# Patient Record
Sex: Female | Born: 1935 | Race: Black or African American | Hispanic: No | Smoking: Former smoker
Health system: Southern US, Community
[De-identification: ages and names within clinical notes are randomized; demographics above are authoritative.]

## PROBLEM LIST (undated history)

## (undated) DIAGNOSIS — I10 Essential (primary) hypertension: Secondary | ICD-10-CM

## (undated) DIAGNOSIS — I739 Peripheral vascular disease, unspecified: Secondary | ICD-10-CM

## (undated) DIAGNOSIS — IMO0002 Reserved for concepts with insufficient information to code with codable children: Secondary | ICD-10-CM

## (undated) DIAGNOSIS — N179 Acute kidney failure, unspecified: Secondary | ICD-10-CM

## (undated) DIAGNOSIS — F32A Depression, unspecified: Secondary | ICD-10-CM

## (undated) DIAGNOSIS — E785 Hyperlipidemia, unspecified: Secondary | ICD-10-CM

## (undated) DIAGNOSIS — D649 Anemia, unspecified: Secondary | ICD-10-CM

## (undated) DIAGNOSIS — E0500 Thyrotoxicosis with diffuse goiter without thyrotoxic crisis or storm: Secondary | ICD-10-CM

## (undated) DIAGNOSIS — I5031 Acute diastolic (congestive) heart failure: Secondary | ICD-10-CM

## (undated) DIAGNOSIS — E119 Type 2 diabetes mellitus without complications: Secondary | ICD-10-CM

## (undated) DIAGNOSIS — E079 Disorder of thyroid, unspecified: Secondary | ICD-10-CM

## (undated) DIAGNOSIS — M1990 Unspecified osteoarthritis, unspecified site: Secondary | ICD-10-CM

## (undated) DIAGNOSIS — K559 Vascular disorder of intestine, unspecified: Secondary | ICD-10-CM

## (undated) DIAGNOSIS — E7800 Pure hypercholesterolemia, unspecified: Secondary | ICD-10-CM

## (undated) DIAGNOSIS — Z227 Latent tuberculosis: Secondary | ICD-10-CM

## (undated) DIAGNOSIS — D0510 Intraductal carcinoma in situ of unspecified breast: Secondary | ICD-10-CM

## (undated) DIAGNOSIS — F329 Major depressive disorder, single episode, unspecified: Secondary | ICD-10-CM

## (undated) DIAGNOSIS — Z9289 Personal history of other medical treatment: Secondary | ICD-10-CM

## (undated) HISTORY — DX: Major depressive disorder, single episode, unspecified: F32.9

## (undated) HISTORY — DX: Intraductal carcinoma in situ of unspecified breast: D05.10

## (undated) HISTORY — DX: Peripheral vascular disease, unspecified: I73.9

## (undated) HISTORY — PX: EYE SURGERY: SHX253

## (undated) HISTORY — DX: Reserved for concepts with insufficient information to code with codable children: IMO0002

## (undated) HISTORY — DX: Type 2 diabetes mellitus without complications: E11.9

## (undated) HISTORY — PX: ABDOMINAL HYSTERECTOMY: SHX81

## (undated) HISTORY — DX: Latent tuberculosis: Z22.7

## (undated) HISTORY — DX: Pure hypercholesterolemia, unspecified: E78.00

## (undated) HISTORY — DX: Essential (primary) hypertension: I10

## (undated) HISTORY — PX: BREAST SURGERY: SHX581

## (undated) HISTORY — DX: Disorder of thyroid, unspecified: E07.9

## (undated) HISTORY — DX: Depression, unspecified: F32.A

## (undated) HISTORY — DX: Vascular disorder of intestine, unspecified: K55.9

## (undated) HISTORY — DX: Hyperlipidemia, unspecified: E78.5

---

## 1973-09-30 HISTORY — PX: TOTAL ABDOMINAL HYSTERECTOMY W/ BILATERAL SALPINGOOPHORECTOMY: SHX83

## 1973-09-30 HISTORY — PX: ANKLE SURGERY: SHX546

## 1998-05-23 ENCOUNTER — Encounter: Admission: RE | Admit: 1998-05-23 | Discharge: 1998-05-23 | Payer: Self-pay | Admitting: Internal Medicine

## 2000-05-02 ENCOUNTER — Ambulatory Visit (HOSPITAL_COMMUNITY): Admission: RE | Admit: 2000-05-02 | Discharge: 2000-05-02 | Payer: Self-pay | Admitting: Family Medicine

## 2000-09-26 ENCOUNTER — Encounter: Payer: Self-pay | Admitting: *Deleted

## 2000-09-26 ENCOUNTER — Ambulatory Visit (HOSPITAL_COMMUNITY): Admission: RE | Admit: 2000-09-26 | Discharge: 2000-09-26 | Payer: Self-pay | Admitting: *Deleted

## 2001-03-06 ENCOUNTER — Encounter: Admission: RE | Admit: 2001-03-06 | Discharge: 2001-03-06 | Payer: Self-pay | Admitting: Internal Medicine

## 2001-03-12 ENCOUNTER — Encounter: Payer: Self-pay | Admitting: *Deleted

## 2001-03-12 ENCOUNTER — Ambulatory Visit (HOSPITAL_COMMUNITY): Admission: RE | Admit: 2001-03-12 | Discharge: 2001-03-12 | Payer: Self-pay | Admitting: *Deleted

## 2001-03-20 ENCOUNTER — Encounter: Admission: RE | Admit: 2001-03-20 | Discharge: 2001-03-20 | Payer: Self-pay | Admitting: Internal Medicine

## 2001-03-30 HISTORY — PX: FEMORAL-POPLITEAL BYPASS GRAFT: SHX937

## 2001-04-06 ENCOUNTER — Encounter: Admission: RE | Admit: 2001-04-06 | Discharge: 2001-04-06 | Payer: Self-pay | Admitting: Internal Medicine

## 2001-04-14 ENCOUNTER — Encounter: Payer: Self-pay | Admitting: Vascular Surgery

## 2001-04-16 ENCOUNTER — Ambulatory Visit (HOSPITAL_COMMUNITY): Admission: RE | Admit: 2001-04-16 | Discharge: 2001-04-16 | Payer: Self-pay | Admitting: Vascular Surgery

## 2001-04-20 ENCOUNTER — Encounter: Payer: Self-pay | Admitting: Vascular Surgery

## 2001-04-20 ENCOUNTER — Inpatient Hospital Stay (HOSPITAL_COMMUNITY): Admission: RE | Admit: 2001-04-20 | Discharge: 2001-04-23 | Payer: Self-pay | Admitting: Vascular Surgery

## 2001-06-09 ENCOUNTER — Emergency Department (HOSPITAL_COMMUNITY): Admission: EM | Admit: 2001-06-09 | Discharge: 2001-06-09 | Payer: Self-pay | Admitting: Emergency Medicine

## 2001-11-17 ENCOUNTER — Encounter: Admission: RE | Admit: 2001-11-17 | Discharge: 2001-11-17 | Payer: Self-pay | Admitting: *Deleted

## 2003-01-29 HISTORY — PX: FEMORAL-POPLITEAL BYPASS GRAFT: SHX937

## 2003-02-08 ENCOUNTER — Encounter: Payer: Self-pay | Admitting: Vascular Surgery

## 2003-02-09 ENCOUNTER — Ambulatory Visit (HOSPITAL_COMMUNITY): Admission: RE | Admit: 2003-02-09 | Discharge: 2003-02-09 | Payer: Self-pay | Admitting: Vascular Surgery

## 2003-02-14 ENCOUNTER — Encounter: Payer: Self-pay | Admitting: Vascular Surgery

## 2003-02-14 ENCOUNTER — Inpatient Hospital Stay (HOSPITAL_COMMUNITY): Admission: RE | Admit: 2003-02-14 | Discharge: 2003-02-19 | Payer: Self-pay | Admitting: Vascular Surgery

## 2003-02-18 ENCOUNTER — Encounter: Payer: Self-pay | Admitting: Vascular Surgery

## 2003-06-01 DIAGNOSIS — D0510 Intraductal carcinoma in situ of unspecified breast: Secondary | ICD-10-CM

## 2003-06-01 HISTORY — DX: Intraductal carcinoma in situ of unspecified breast: D05.10

## 2003-06-09 ENCOUNTER — Encounter: Admission: RE | Admit: 2003-06-09 | Discharge: 2003-06-09 | Payer: Self-pay | Admitting: Internal Medicine

## 2003-06-20 ENCOUNTER — Ambulatory Visit (HOSPITAL_COMMUNITY): Admission: RE | Admit: 2003-06-20 | Discharge: 2003-06-20 | Payer: Self-pay

## 2003-06-21 ENCOUNTER — Encounter: Admission: RE | Admit: 2003-06-21 | Discharge: 2003-06-21 | Payer: Self-pay | Admitting: Internal Medicine

## 2003-06-28 ENCOUNTER — Encounter: Admission: RE | Admit: 2003-06-28 | Discharge: 2003-06-28 | Payer: Self-pay | Admitting: Internal Medicine

## 2003-06-28 ENCOUNTER — Encounter (INDEPENDENT_AMBULATORY_CARE_PROVIDER_SITE_OTHER): Payer: Self-pay | Admitting: *Deleted

## 2003-06-28 ENCOUNTER — Encounter: Payer: Self-pay | Admitting: Internal Medicine

## 2003-08-03 ENCOUNTER — Emergency Department (HOSPITAL_COMMUNITY): Admission: EM | Admit: 2003-08-03 | Discharge: 2003-08-03 | Payer: Self-pay | Admitting: *Deleted

## 2003-09-02 ENCOUNTER — Encounter: Admission: RE | Admit: 2003-09-02 | Discharge: 2003-09-02 | Payer: Self-pay | Admitting: Internal Medicine

## 2003-09-07 ENCOUNTER — Ambulatory Visit (HOSPITAL_COMMUNITY): Admission: RE | Admit: 2003-09-07 | Discharge: 2003-09-07 | Payer: Self-pay | Admitting: Internal Medicine

## 2003-10-01 ENCOUNTER — Ambulatory Visit (HOSPITAL_COMMUNITY): Admission: RE | Admit: 2003-10-01 | Discharge: 2003-10-01 | Payer: Self-pay | Admitting: Family Medicine

## 2003-10-11 ENCOUNTER — Encounter: Admission: RE | Admit: 2003-10-11 | Discharge: 2003-10-11 | Payer: Self-pay | Admitting: Internal Medicine

## 2004-01-20 ENCOUNTER — Encounter: Admission: RE | Admit: 2004-01-20 | Discharge: 2004-01-20 | Payer: Self-pay | Admitting: General Surgery

## 2004-01-29 HISTORY — PX: MASTECTOMY, PARTIAL: SHX709

## 2004-02-01 ENCOUNTER — Encounter: Admission: RE | Admit: 2004-02-01 | Discharge: 2004-02-01 | Payer: Self-pay | Admitting: General Surgery

## 2004-02-03 ENCOUNTER — Ambulatory Visit (HOSPITAL_COMMUNITY): Admission: RE | Admit: 2004-02-03 | Discharge: 2004-02-03 | Payer: Self-pay | Admitting: General Surgery

## 2004-02-03 ENCOUNTER — Ambulatory Visit (HOSPITAL_BASED_OUTPATIENT_CLINIC_OR_DEPARTMENT_OTHER): Admission: RE | Admit: 2004-02-03 | Discharge: 2004-02-03 | Payer: Self-pay | Admitting: General Surgery

## 2004-02-03 ENCOUNTER — Encounter: Admission: RE | Admit: 2004-02-03 | Discharge: 2004-02-03 | Payer: Self-pay | Admitting: General Surgery

## 2004-02-03 ENCOUNTER — Encounter (INDEPENDENT_AMBULATORY_CARE_PROVIDER_SITE_OTHER): Payer: Self-pay | Admitting: Specialist

## 2004-02-10 ENCOUNTER — Encounter: Admission: RE | Admit: 2004-02-10 | Discharge: 2004-02-10 | Payer: Self-pay | Admitting: Internal Medicine

## 2004-02-24 ENCOUNTER — Encounter: Admission: RE | Admit: 2004-02-24 | Discharge: 2004-02-24 | Payer: Self-pay | Admitting: Internal Medicine

## 2004-03-07 ENCOUNTER — Ambulatory Visit: Admission: RE | Admit: 2004-03-07 | Discharge: 2004-04-10 | Payer: Self-pay | Admitting: Radiation Oncology

## 2004-06-28 ENCOUNTER — Encounter: Admission: RE | Admit: 2004-06-28 | Discharge: 2004-06-28 | Payer: Self-pay | Admitting: General Surgery

## 2004-10-22 ENCOUNTER — Ambulatory Visit: Payer: Self-pay | Admitting: Internal Medicine

## 2004-10-22 LAB — HM PAP SMEAR

## 2004-10-22 LAB — CONVERTED CEMR LAB

## 2004-11-05 ENCOUNTER — Ambulatory Visit: Payer: Self-pay | Admitting: Internal Medicine

## 2004-11-05 ENCOUNTER — Ambulatory Visit (HOSPITAL_COMMUNITY): Admission: RE | Admit: 2004-11-05 | Discharge: 2004-11-05 | Payer: Self-pay | Admitting: Internal Medicine

## 2004-11-19 ENCOUNTER — Ambulatory Visit: Payer: Self-pay | Admitting: Internal Medicine

## 2004-11-26 ENCOUNTER — Encounter: Admission: RE | Admit: 2004-11-26 | Discharge: 2005-02-24 | Payer: Self-pay | Admitting: Internal Medicine

## 2005-03-06 ENCOUNTER — Ambulatory Visit: Payer: Self-pay | Admitting: Internal Medicine

## 2005-05-20 ENCOUNTER — Ambulatory Visit: Payer: Self-pay | Admitting: Oncology

## 2005-07-01 ENCOUNTER — Encounter: Admission: RE | Admit: 2005-07-01 | Discharge: 2005-07-01 | Payer: Self-pay | Admitting: Oncology

## 2005-10-03 ENCOUNTER — Ambulatory Visit: Payer: Self-pay | Admitting: Internal Medicine

## 2006-01-28 ENCOUNTER — Ambulatory Visit: Payer: Self-pay | Admitting: Internal Medicine

## 2006-01-29 ENCOUNTER — Encounter (INDEPENDENT_AMBULATORY_CARE_PROVIDER_SITE_OTHER): Payer: Self-pay | Admitting: *Deleted

## 2006-01-29 LAB — CONVERTED CEMR LAB: Microalbumin U total vol: 5.2 mg/L

## 2006-02-11 ENCOUNTER — Ambulatory Visit: Payer: Self-pay | Admitting: Internal Medicine

## 2006-04-01 ENCOUNTER — Ambulatory Visit: Payer: Self-pay | Admitting: Internal Medicine

## 2006-05-19 ENCOUNTER — Ambulatory Visit: Payer: Self-pay | Admitting: Oncology

## 2006-07-07 ENCOUNTER — Encounter: Admission: RE | Admit: 2006-07-07 | Discharge: 2006-07-07 | Payer: Self-pay | Admitting: General Surgery

## 2006-08-04 ENCOUNTER — Encounter (INDEPENDENT_AMBULATORY_CARE_PROVIDER_SITE_OTHER): Payer: Self-pay | Admitting: *Deleted

## 2006-08-04 DIAGNOSIS — I10 Essential (primary) hypertension: Secondary | ICD-10-CM | POA: Insufficient documentation

## 2006-08-04 DIAGNOSIS — D179 Benign lipomatous neoplasm, unspecified: Secondary | ICD-10-CM | POA: Insufficient documentation

## 2006-08-04 DIAGNOSIS — Z9079 Acquired absence of other genital organ(s): Secondary | ICD-10-CM | POA: Insufficient documentation

## 2006-08-04 DIAGNOSIS — E119 Type 2 diabetes mellitus without complications: Secondary | ICD-10-CM | POA: Insufficient documentation

## 2006-08-04 DIAGNOSIS — Z853 Personal history of malignant neoplasm of breast: Secondary | ICD-10-CM | POA: Insufficient documentation

## 2006-09-09 ENCOUNTER — Encounter (INDEPENDENT_AMBULATORY_CARE_PROVIDER_SITE_OTHER): Payer: Self-pay | Admitting: *Deleted

## 2006-09-09 ENCOUNTER — Ambulatory Visit: Payer: Self-pay | Admitting: Internal Medicine

## 2006-09-09 LAB — CONVERTED CEMR LAB: Hgb A1c MFr Bld: 7.7 %

## 2006-10-22 ENCOUNTER — Telehealth (INDEPENDENT_AMBULATORY_CARE_PROVIDER_SITE_OTHER): Payer: Self-pay | Admitting: *Deleted

## 2007-03-03 ENCOUNTER — Telehealth (INDEPENDENT_AMBULATORY_CARE_PROVIDER_SITE_OTHER): Payer: Self-pay | Admitting: *Deleted

## 2007-03-26 ENCOUNTER — Telehealth: Payer: Self-pay | Admitting: *Deleted

## 2007-04-07 ENCOUNTER — Ambulatory Visit: Payer: Self-pay | Admitting: Vascular Surgery

## 2007-04-08 ENCOUNTER — Encounter (INDEPENDENT_AMBULATORY_CARE_PROVIDER_SITE_OTHER): Payer: Self-pay | Admitting: *Deleted

## 2007-04-08 ENCOUNTER — Ambulatory Visit: Payer: Self-pay | Admitting: Internal Medicine

## 2007-04-08 LAB — CONVERTED CEMR LAB
BUN: 15 mg/dL (ref 6–23)
Blood Glucose, Fingerstick: 197
CO2: 25 meq/L (ref 19–32)
Calcium: 9.3 mg/dL (ref 8.4–10.5)
Chloride: 103 meq/L (ref 96–112)
Creatinine, Ser: 0.87 mg/dL (ref 0.40–1.20)
Glucose, Bld: 150 mg/dL — ABNORMAL HIGH (ref 70–99)
Hgb A1c MFr Bld: 6.8 %
Potassium: 4.2 meq/L (ref 3.5–5.3)
Sodium: 140 meq/L (ref 135–145)

## 2007-07-09 ENCOUNTER — Encounter: Admission: RE | Admit: 2007-07-09 | Discharge: 2007-07-09 | Payer: Self-pay | Admitting: General Surgery

## 2007-09-15 ENCOUNTER — Telehealth: Payer: Self-pay | Admitting: *Deleted

## 2007-10-20 ENCOUNTER — Telehealth: Payer: Self-pay | Admitting: Infectious Disease

## 2007-10-26 ENCOUNTER — Encounter (INDEPENDENT_AMBULATORY_CARE_PROVIDER_SITE_OTHER): Payer: Self-pay | Admitting: Internal Medicine

## 2007-10-28 ENCOUNTER — Ambulatory Visit: Payer: Self-pay | Admitting: Internal Medicine

## 2007-10-28 ENCOUNTER — Encounter (INDEPENDENT_AMBULATORY_CARE_PROVIDER_SITE_OTHER): Payer: Self-pay | Admitting: Internal Medicine

## 2007-10-28 LAB — CONVERTED CEMR LAB
Blood Glucose, Fingerstick: 147
Hgb A1c MFr Bld: 7 %

## 2007-10-29 ENCOUNTER — Ambulatory Visit (HOSPITAL_COMMUNITY): Admission: RE | Admit: 2007-10-29 | Discharge: 2007-10-29 | Payer: Self-pay | Admitting: Internal Medicine

## 2007-10-29 LAB — CONVERTED CEMR LAB
ALT: 20 units/L (ref 0–35)
AST: 17 units/L (ref 0–37)
Albumin: 4.3 g/dL (ref 3.5–5.2)
Alkaline Phosphatase: 84 units/L (ref 39–117)
BUN: 15 mg/dL (ref 6–23)
CO2: 22 meq/L (ref 19–32)
Calcium: 9.3 mg/dL (ref 8.4–10.5)
Chloride: 109 meq/L (ref 96–112)
Cholesterol: 271 mg/dL — ABNORMAL HIGH (ref 0–200)
Creatinine, Ser: 0.7 mg/dL (ref 0.40–1.20)
Glucose, Bld: 148 mg/dL — ABNORMAL HIGH (ref 70–99)
HDL: 65 mg/dL (ref >39)
LDL Cholesterol: 186 mg/dL — ABNORMAL HIGH (ref 0–99)
Potassium: 4.3 meq/L (ref 3.5–5.3)
Sodium: 144 meq/L (ref 135–145)
Total Bilirubin: 0.3 mg/dL (ref 0.3–1.2)
Total CHOL/HDL Ratio: 4.2
Total Protein: 7.6 g/dL (ref 6.0–8.3)
Triglycerides: 101 mg/dL (ref <150)
VLDL: 20 mg/dL (ref 0–40)

## 2007-11-18 ENCOUNTER — Encounter (INDEPENDENT_AMBULATORY_CARE_PROVIDER_SITE_OTHER): Payer: Self-pay | Admitting: Internal Medicine

## 2007-11-23 ENCOUNTER — Ambulatory Visit: Payer: Self-pay | Admitting: Internal Medicine

## 2007-11-23 LAB — CONVERTED CEMR LAB: Blood Glucose, Fingerstick: 230

## 2007-12-23 ENCOUNTER — Telehealth: Payer: Self-pay | Admitting: *Deleted

## 2007-12-24 ENCOUNTER — Encounter (INDEPENDENT_AMBULATORY_CARE_PROVIDER_SITE_OTHER): Payer: Self-pay | Admitting: *Deleted

## 2007-12-29 ENCOUNTER — Ambulatory Visit: Payer: Self-pay | Admitting: Internal Medicine

## 2007-12-29 DIAGNOSIS — J984 Other disorders of lung: Secondary | ICD-10-CM | POA: Insufficient documentation

## 2007-12-29 LAB — CONVERTED CEMR LAB: Blood Glucose, Fingerstick: 204

## 2008-01-04 ENCOUNTER — Ambulatory Visit (HOSPITAL_COMMUNITY): Admission: RE | Admit: 2008-01-04 | Discharge: 2008-01-04 | Payer: Self-pay | Admitting: Internal Medicine

## 2008-02-08 ENCOUNTER — Ambulatory Visit: Payer: Self-pay | Admitting: Vascular Surgery

## 2008-03-21 ENCOUNTER — Telehealth (INDEPENDENT_AMBULATORY_CARE_PROVIDER_SITE_OTHER): Payer: Self-pay | Admitting: Internal Medicine

## 2008-03-22 ENCOUNTER — Encounter (INDEPENDENT_AMBULATORY_CARE_PROVIDER_SITE_OTHER): Payer: Self-pay | Admitting: Internal Medicine

## 2008-03-23 ENCOUNTER — Encounter (INDEPENDENT_AMBULATORY_CARE_PROVIDER_SITE_OTHER): Payer: Self-pay | Admitting: Internal Medicine

## 2008-03-28 ENCOUNTER — Ambulatory Visit: Payer: Self-pay | Admitting: Internal Medicine

## 2008-03-28 ENCOUNTER — Encounter (INDEPENDENT_AMBULATORY_CARE_PROVIDER_SITE_OTHER): Payer: Self-pay | Admitting: *Deleted

## 2008-03-28 LAB — CONVERTED CEMR LAB
BUN: 18 mg/dL (ref 6–23)
Blood Glucose, Fingerstick: 113
CO2: 23 meq/L (ref 19–32)
Calcium: 9.4 mg/dL (ref 8.4–10.5)
Chloride: 103 meq/L (ref 96–112)
Creatinine, Ser: 0.61 mg/dL (ref 0.40–1.20)
Glucose, Bld: 103 mg/dL — ABNORMAL HIGH (ref 70–99)
Hgb A1c MFr Bld: 7.5 %
Potassium: 4.1 meq/L (ref 3.5–5.3)
Sodium: 139 meq/L (ref 135–145)

## 2008-04-19 ENCOUNTER — Encounter (INDEPENDENT_AMBULATORY_CARE_PROVIDER_SITE_OTHER): Payer: Self-pay | Admitting: Internal Medicine

## 2008-04-20 ENCOUNTER — Encounter (INDEPENDENT_AMBULATORY_CARE_PROVIDER_SITE_OTHER): Payer: Self-pay | Admitting: Internal Medicine

## 2008-04-27 ENCOUNTER — Ambulatory Visit: Payer: Self-pay | Admitting: Internal Medicine

## 2008-04-27 ENCOUNTER — Encounter (INDEPENDENT_AMBULATORY_CARE_PROVIDER_SITE_OTHER): Payer: Self-pay | Admitting: Internal Medicine

## 2008-04-27 LAB — CONVERTED CEMR LAB
ALT: 22 units/L (ref 0–35)
AST: 23 units/L (ref 0–37)
Albumin: 3.8 g/dL (ref 3.5–5.2)
Alkaline Phosphatase: 64 units/L (ref 39–117)
BUN: 14 mg/dL (ref 6–23)
CO2: 24 meq/L (ref 19–32)
Calcium: 8.9 mg/dL (ref 8.4–10.5)
Chloride: 108 meq/L (ref 96–112)
Creatinine, Ser: 0.7 mg/dL (ref 0.40–1.20)
Glucose, Bld: 94 mg/dL (ref 70–99)
HCT: 36.2 % (ref 36.0–46.0)
Hemoglobin: 12.4 g/dL (ref 12.0–15.0)
MCHC: 34.3 g/dL (ref 30.0–36.0)
MCV: 97.8 fL (ref 78.0–100.0)
Platelets: 228 10*3/uL (ref 150–400)
Potassium: 3.1 meq/L — ABNORMAL LOW (ref 3.5–5.3)
RBC: 3.7 M/uL — ABNORMAL LOW (ref 3.87–5.11)
RDW: 13.5 % (ref 11.5–15.5)
Sodium: 139 meq/L (ref 135–145)
Total Bilirubin: 0.7 mg/dL (ref 0.3–1.2)
Total Protein: 6.8 g/dL (ref 6.0–8.3)
WBC: 5.2 10*3/uL (ref 4.0–10.5)

## 2008-04-30 LAB — CONVERTED CEMR LAB
Bilirubin Urine: NEGATIVE
Creatinine, Urine: 201.7 mg/dL
Hemoglobin, Urine: NEGATIVE
Ketones, ur: NEGATIVE mg/dL
Leukocytes, UA: NEGATIVE
Microalb Creat Ratio: 367.9 mg/g — ABNORMAL HIGH (ref 0.0–30.0)
Microalb, Ur: 74.2 mg/dL — ABNORMAL HIGH (ref 0.00–1.89)
Nitrite: NEGATIVE
Protein, ur: 100 mg/dL — AB
RBC / HPF: NONE SEEN (ref <3)
Specific Gravity, Urine: 1.024 (ref 1.005–1.03)
Urine Glucose: NEGATIVE mg/dL
Urobilinogen, UA: 0.2 (ref 0.0–1.0)
pH: 5.5 (ref 5.0–8.0)

## 2008-05-04 ENCOUNTER — Ambulatory Visit (HOSPITAL_COMMUNITY): Admission: RE | Admit: 2008-05-04 | Discharge: 2008-05-04 | Payer: Self-pay | Admitting: Internal Medicine

## 2008-05-04 ENCOUNTER — Encounter (INDEPENDENT_AMBULATORY_CARE_PROVIDER_SITE_OTHER): Payer: Self-pay | Admitting: Emergency Medicine

## 2008-05-04 ENCOUNTER — Ambulatory Visit: Payer: Self-pay | Admitting: Cardiology

## 2008-05-23 ENCOUNTER — Ambulatory Visit: Payer: Self-pay | Admitting: Internal Medicine

## 2008-05-23 DIAGNOSIS — E785 Hyperlipidemia, unspecified: Secondary | ICD-10-CM | POA: Insufficient documentation

## 2008-07-11 ENCOUNTER — Encounter: Admission: RE | Admit: 2008-07-11 | Discharge: 2008-07-11 | Payer: Self-pay | Admitting: Internal Medicine

## 2008-09-08 ENCOUNTER — Ambulatory Visit: Payer: Self-pay | Admitting: Internal Medicine

## 2008-09-08 ENCOUNTER — Encounter (INDEPENDENT_AMBULATORY_CARE_PROVIDER_SITE_OTHER): Payer: Self-pay | Admitting: Internal Medicine

## 2008-09-08 LAB — CONVERTED CEMR LAB
Blood Glucose, Fingerstick: 88
Hgb A1c MFr Bld: 6.1 %

## 2008-09-13 ENCOUNTER — Encounter (INDEPENDENT_AMBULATORY_CARE_PROVIDER_SITE_OTHER): Payer: Self-pay | Admitting: Internal Medicine

## 2008-09-21 ENCOUNTER — Ambulatory Visit: Payer: Self-pay | Admitting: Infectious Diseases

## 2008-09-21 LAB — CONVERTED CEMR LAB: Blood Glucose, Fingerstick: 125

## 2008-10-24 ENCOUNTER — Encounter (INDEPENDENT_AMBULATORY_CARE_PROVIDER_SITE_OTHER): Payer: Self-pay | Admitting: Internal Medicine

## 2008-10-24 ENCOUNTER — Ambulatory Visit: Payer: Self-pay | Admitting: Internal Medicine

## 2008-10-24 LAB — CONVERTED CEMR LAB
ALT: 26 units/L (ref 0–35)
AST: 19 units/L (ref 0–37)
Albumin: 4.1 g/dL (ref 3.5–5.2)
Alkaline Phosphatase: 62 units/L (ref 39–117)
BUN: 23 mg/dL (ref 6–23)
CO2: 22 meq/L (ref 19–32)
Calcium: 9.1 mg/dL (ref 8.4–10.5)
Chloride: 105 meq/L (ref 96–112)
Cholesterol: 145 mg/dL (ref 0–200)
Creatinine, Ser: 0.89 mg/dL (ref 0.40–1.20)
Glucose, Bld: 122 mg/dL — ABNORMAL HIGH (ref 70–99)
HDL: 63 mg/dL (ref >39)
LDL Cholesterol: 66 mg/dL (ref 0–99)
Potassium: 4.1 meq/L (ref 3.5–5.3)
Sodium: 142 meq/L (ref 135–145)
Total Bilirubin: 0.6 mg/dL (ref 0.3–1.2)
Total CHOL/HDL Ratio: 2.3
Total Protein: 7 g/dL (ref 6.0–8.3)
Triglycerides: 82 mg/dL (ref <150)
VLDL: 16 mg/dL (ref 0–40)

## 2008-11-03 ENCOUNTER — Ambulatory Visit: Payer: Self-pay | Admitting: Internal Medicine

## 2008-11-03 ENCOUNTER — Ambulatory Visit (HOSPITAL_COMMUNITY): Admission: RE | Admit: 2008-11-03 | Discharge: 2008-11-03 | Payer: Self-pay | Admitting: *Deleted

## 2008-11-14 ENCOUNTER — Telehealth: Payer: Self-pay | Admitting: *Deleted

## 2008-11-30 ENCOUNTER — Encounter (INDEPENDENT_AMBULATORY_CARE_PROVIDER_SITE_OTHER): Payer: Self-pay | Admitting: Internal Medicine

## 2008-12-06 ENCOUNTER — Ambulatory Visit: Payer: Self-pay | Admitting: Internal Medicine

## 2009-01-10 ENCOUNTER — Ambulatory Visit: Payer: Self-pay | Admitting: Internal Medicine

## 2009-01-10 ENCOUNTER — Encounter: Payer: Self-pay | Admitting: Internal Medicine

## 2009-01-10 LAB — HM COLONOSCOPY: HM Colonoscopy: 2

## 2009-01-14 ENCOUNTER — Encounter: Payer: Self-pay | Admitting: Internal Medicine

## 2009-02-07 ENCOUNTER — Ambulatory Visit: Payer: Self-pay | Admitting: Vascular Surgery

## 2009-02-20 ENCOUNTER — Encounter (INDEPENDENT_AMBULATORY_CARE_PROVIDER_SITE_OTHER): Payer: Self-pay | Admitting: Internal Medicine

## 2009-02-20 ENCOUNTER — Ambulatory Visit: Payer: Self-pay | Admitting: *Deleted

## 2009-02-20 ENCOUNTER — Ambulatory Visit (HOSPITAL_COMMUNITY): Admission: RE | Admit: 2009-02-20 | Discharge: 2009-02-20 | Payer: Self-pay | Admitting: *Deleted

## 2009-02-20 LAB — CONVERTED CEMR LAB
Bilirubin Urine: NEGATIVE
Blood Glucose, Fingerstick: 89
Creatinine, Urine: 92 mg/dL
Hemoglobin, Urine: NEGATIVE
Hgb A1c MFr Bld: 6.7 %
Ketones, ur: NEGATIVE mg/dL
Leukocytes, UA: NEGATIVE
Microalb Creat Ratio: 93.9 mg/g — ABNORMAL HIGH (ref 0.0–30.0)
Microalb, Ur: 8.64 mg/dL — ABNORMAL HIGH (ref 0.00–1.89)
Nitrite: NEGATIVE
Protein, ur: NEGATIVE mg/dL
Specific Gravity, Urine: 1.016 (ref 1.005–1.030)
Urine Glucose: NEGATIVE mg/dL
Urobilinogen, UA: 2 — ABNORMAL HIGH (ref 0.0–1.0)
pH: 6.5 (ref 5.0–8.0)

## 2009-02-24 ENCOUNTER — Emergency Department (HOSPITAL_COMMUNITY): Admission: EM | Admit: 2009-02-24 | Discharge: 2009-02-24 | Payer: Self-pay | Admitting: Emergency Medicine

## 2009-07-12 ENCOUNTER — Encounter: Admission: RE | Admit: 2009-07-12 | Discharge: 2009-07-12 | Payer: Self-pay | Admitting: Internal Medicine

## 2010-01-16 ENCOUNTER — Ambulatory Visit: Payer: Self-pay | Admitting: Internal Medicine

## 2010-01-16 LAB — CONVERTED CEMR LAB
Blood Glucose, Fingerstick: 105
Hgb A1c MFr Bld: 6.3 %

## 2010-01-18 ENCOUNTER — Telehealth: Payer: Self-pay | Admitting: *Deleted

## 2010-01-18 LAB — CONVERTED CEMR LAB
ALT: 17 units/L (ref 0–35)
AST: 20 units/L (ref 0–37)
Albumin: 4.3 g/dL (ref 3.5–5.2)
Alkaline Phosphatase: 73 units/L (ref 39–117)
BUN: 22 mg/dL (ref 6–23)
CO2: 25 meq/L (ref 19–32)
Calcium: 9.2 mg/dL (ref 8.4–10.5)
Chloride: 101 meq/L (ref 96–112)
Cholesterol: 237 mg/dL — ABNORMAL HIGH (ref 0–200)
Creatinine, Ser: 0.78 mg/dL (ref 0.40–1.20)
Glucose, Bld: 131 mg/dL — ABNORMAL HIGH (ref 70–99)
HDL: 71 mg/dL (ref >39)
LDL Cholesterol: 145 mg/dL — ABNORMAL HIGH (ref 0–99)
Potassium: 4.1 meq/L (ref 3.5–5.3)
Sodium: 138 meq/L (ref 135–145)
Total Bilirubin: 0.5 mg/dL (ref 0.3–1.2)
Total CHOL/HDL Ratio: 3.3
Total Protein: 7.5 g/dL (ref 6.0–8.3)
Triglycerides: 103 mg/dL (ref <150)
VLDL: 21 mg/dL (ref 0–40)

## 2010-02-22 ENCOUNTER — Ambulatory Visit: Payer: Self-pay | Admitting: Vascular Surgery

## 2010-03-05 ENCOUNTER — Telehealth (INDEPENDENT_AMBULATORY_CARE_PROVIDER_SITE_OTHER): Payer: Self-pay | Admitting: Internal Medicine

## 2010-06-18 ENCOUNTER — Ambulatory Visit: Payer: Self-pay | Admitting: Internal Medicine

## 2010-06-18 LAB — CONVERTED CEMR LAB
Blood Glucose, Fingerstick: 105
Cholesterol, target level: 200 mg/dL
HDL goal, serum: 40 mg/dL
Hgb A1c MFr Bld: 5.9 %
LDL Goal: 70 mg/dL

## 2010-06-18 LAB — HM DIABETES FOOT EXAM: HM Diabetic Foot Exam: HIGH

## 2010-06-19 ENCOUNTER — Encounter: Payer: Self-pay | Admitting: Internal Medicine

## 2010-06-19 LAB — CONVERTED CEMR LAB
Creatinine, Urine: 136.4 mg/dL
Microalb Creat Ratio: 95.7 mg/g — ABNORMAL HIGH (ref 0.0–30.0)
Microalb, Ur: 13.06 mg/dL — ABNORMAL HIGH (ref 0.00–1.89)

## 2010-06-20 ENCOUNTER — Encounter: Payer: Self-pay | Admitting: Internal Medicine

## 2010-06-25 ENCOUNTER — Telehealth (INDEPENDENT_AMBULATORY_CARE_PROVIDER_SITE_OTHER): Payer: Self-pay | Admitting: *Deleted

## 2010-07-09 ENCOUNTER — Encounter: Admission: RE | Admit: 2010-07-09 | Discharge: 2010-07-09 | Payer: Self-pay | Admitting: Internal Medicine

## 2010-07-09 LAB — HM MAMMOGRAPHY

## 2010-08-16 ENCOUNTER — Ambulatory Visit: Payer: Self-pay | Admitting: Internal Medicine

## 2010-08-16 DIAGNOSIS — K5900 Constipation, unspecified: Secondary | ICD-10-CM | POA: Insufficient documentation

## 2010-08-16 LAB — CONVERTED CEMR LAB: Blood Glucose, Fingerstick: 120

## 2010-08-29 ENCOUNTER — Ambulatory Visit (HOSPITAL_COMMUNITY): Admission: RE | Admit: 2010-08-29 | Discharge: 2010-08-29 | Payer: Self-pay | Admitting: Ophthalmology

## 2010-10-20 ENCOUNTER — Encounter: Payer: Self-pay | Admitting: Internal Medicine

## 2010-10-21 ENCOUNTER — Encounter: Payer: Self-pay | Admitting: *Deleted

## 2010-10-22 ENCOUNTER — Encounter: Payer: Self-pay | Admitting: Internal Medicine
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
## 2010-10-30 NOTE — Progress Notes (Signed)
Summary: refill/gg  Phone Note Refill Request  on June 25, 2010 10:26 AM  Refills Requested: Medication #1:  LISINOPRIL 20 MG TABS Take one tablet daily for blood pressure.  Method Requested: Electronic Initial call taken by: Gevena Cotton RN,  June 25, 2010 11:53 AM    Prescriptions: LISINOPRIL 20 MG TABS (LISINOPRIL) Take one tablet daily for blood pressure.  #30 x 6   Entered and Authorized by:   Burman Freestone MD   Signed by:   Burman Freestone MD on 06/25/2010   Method used:   Electronically to        [facility] (retail)       [address] [place], [place]  [address]       Ph: [phone]       Fax: [phone]   RxID:   [id number]

## 2010-10-30 NOTE — Miscellaneous (Signed)
Summary: HIPPA  HIPPA   Imported By: Susette Racer 10/24/2008 12:16:20  _____________________________________________________________________  External Attachment:    Type:   Image     Comment:   External Document

## 2010-10-30 NOTE — Miscellaneous (Signed)
Summary: GI PV  Clinical Lists Changes  Medications: Added new medication of DULCOLAX 5 MG  TBEC (BISACODYL) Day before procedure take 2 at 3pm and 2 at 8pm. - Signed Added new medication of METOCLOPRAMIDE HCL 10 MG  TABS (METOCLOPRAMIDE HCL) As per prep instructions. - Signed Added new medication of MIRALAX   POWD (POLYETHYLENE GLYCOL 3350) As per prep  instructions. - Signed Rx of DULCOLAX 5 MG  TBEC (BISACODYL) Day before procedure take 2 at 3pm and 2 at 8pm.;  #4 x 0;  Signed;  Entered by: Shaune Spittle RN;  Authorized by: Lafayette Dragon;  Method used: Electronically to [facility] [address] [place], [place], [place]  [address], Ph: [phone], Fax: [phone] Rx of METOCLOPRAMIDE HCL 10 MG  TABS (METOCLOPRAMIDE HCL) As per prep instructions.;  #2 x 0;  Signed;  Entered by: Shaune Spittle RN;  Authorized by: Lafayette Dragon;  Method used: Electronically to [facility] [address], [place], [place], [place]  [address], Ph: [phone], Fax: [phone] Rx of MIRALAX   POWD (POLYETHYLENE GLYCOL 3350) As per prep  instructions.;  #255gm x 0;  Signed;  Entered by: Shaune Spittle RN;  Authorized by: Lafayette Dragon;  Method used: Electronically to [facility] [address], [place], [place], [place]  [address], Ph: [phone], Fax: [phone] Observations: Added new observation of NKA: T (12/06/2008 13:49)    Prescriptions: MIRALAX   POWD (POLYETHYLENE GLYCOL 3350) As per prep  instructions.  #255gm x 0   Entered by:   Shaune Spittle RN   Authorized by:   Lafayette Dragon   Signed by:   Shaune Spittle RN on 12/06/2008   Method used:   Electronically to        [facility] (retail)       [place], [place]  [address]       Ph: [phone]       Fax: [phone]   RxID:   [id number] METOCLOPRAMIDE HCL 10 MG  TABS (METOCLOPRAMIDE HCL) As per prep instructions.  #2 x 0    Entered by:   Shaune Spittle RN   Authorized by:   Lafayette Dragon   Signed by:   Shaune Spittle RN on 12/06/2008   Method used:   Electronically to        [facility] (retail)       [address] [place], [place]  [address]       Ph: [phone]       Fax: [phone]   RxID:   [id number] DULCOLAX 5 MG  TBEC (BISACODYL) Day before procedure take 2 at 3pm and 2 at 8pm.  #4 x 0   Entered by:   Shaune Spittle RN   Authorized by:   Lafayette Dragon   Signed by:   Shaune Spittle RN on 12/06/2008   Method used:   Electronically to        [facility] (retail)       [address]       [place],   [address]       Ph: [phone]  Fax: [phone]   RxIDYI:[id number]
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
## 2010-10-30 NOTE — Letter (Signed)
Summary: [facility] Communicable Disease Report   Communicable Disease Report   Imported By: Bonner Puna 10/29/2007 14:49:03  _____________________________________________________________________  External Attachment:    Type:   Image     Comment:   External Document

## 2010-10-30 NOTE — Assessment & Plan Note (Signed)
Summary: FU VISIT/DS   Vital Signs:  Patient Profile:   77 Years Old Female Height:     66.75 inches (169.55 cm) Weight:      155.7 pounds (70.77 kg) BMI:     24.66 Temp:     97.7 degrees F (36.50 degrees C) oral Pulse rate:   73 / minute BP sitting:   150 / 64  (right arm)  Pt. in pain?   no  Vitals Entered By: Hilda Blades Ditzler RN (September 21, 2008 9:27 AM)              Is Patient Diabetic? Yes Did you bring your meter with you today? Yes Nutritional Status BMI of 19 -24 = normal Nutritional Status Detail appetite ok CBG Result 125  Have you ever been in a relationship where you felt threatened, hurt or afraid?denies   Does patient need assistance? Functional Status Self care Ambulation Normal     PCP:  Dionicio Stall, MD  Chief Complaint:  FU and head congestion.Marland Kitchen  History of Present Illness: this is a 86 yof with pmh of DM, HLD, HTN, and tobacco use.  She is here for follow up of her last appointment 2 weeks ago.  At that time she was started in norvasc and lipitor.  She was instructed to keep track of dizzyness and check her cbg's when she was feeling dizzy.  Today she has a head cold which is resolving.  She reports that she has gotten her new medications and is taking them without problem.  She has no further episodes of dizzyness this week. She has not been checking her cbg's at all, because it hurts too badly.    Prior Medication List:  GLIPIZIDE XL 10 MG TB24 (GLIPIZIDE) Take 1 tablet by mouth once a day LISINOPRIL 20 MG  TABS (LISINOPRIL) Take one tablet daily for blood pressure. ULTRACET  TABS (TRAMADOL-ACETAMINOPHEN TABS) take one tablet by mouth every 4hrs. as needed [facility] VITAMIN B-6 50 MG  TABS (PYRIDOXINE HCL) Take one tablet daily while you are taking isoniazid. HYDROCHLOROTHIAZIDE 25 MG  TABS (HYDROCHLOROTHIAZIDE) Take one tablet daily for blood pressure. NORVASC 5 MG TABS (AMLODIPINE BESYLATE) Take one tablet daily for blood pressure.  LIPITOR 40 MG TABS (ATORVASTATIN CALCIUM) Take one tablet daily for cholesterol.   Current Allergies (reviewed today): No known allergies   Past Medical History:    S/p Rt partial Mastectomy for intraductal Ca of the Rt breast (5/05) followed by Dr. Marylene Buerger.    H/o PVD S/p Lt Femoral-popliteal bypass Sx 5/04    Diabetes mellitus, type II    Hypertension    H/o Hyperlipidemia    S/p Hystrectomy for ?cancer/mass    Latent Tb- treated with isoniazid starting in 11/2007    ECHO 05/04/08 EF 55-65%    Risk Factors: Tobacco use:  current    Year started:  1943    Cigarettes:  Yes -- 1/2 pack(s) per day Drug use:  no Alcohol use:  no Exercise:  no Seatbelt use:  100 %  Mammogram History:    Date of Last Mammogram:  07/09/2007  PAP Smear History:    Date of Last PAP Smear:  10/22/2004   Review of Systems       per hpi   Physical Exam  General:     alert and well-developed.   Head:     normocephalic and atraumatic.   Lungs:     normal respiratory effort and normal breath sounds.   Heart:  normal rate, regular rhythm, and no murmur.      Impression & Recommendations:  Problem # 1:  HYPERTENSION (ICD-401.9) BP much improved with norvasc.  She will return in one month for recheck.  If still elevated would increase to 10mg  of norvasc.  Her updated medication list for this problem includes:    Lisinopril 20 Mg Tabs (Lisinopril) .Marland Kitchen... Take one tablet daily for blood pressure.    Hydrochlorothiazide 25 Mg Tabs (Hydrochlorothiazide) .Marland Kitchen... Take one tablet daily for blood pressure.    Norvasc 5 Mg Tabs (Amlodipine besylate) .Marland Kitchen... Take one tablet daily for blood pressure.  BP today: 150/64 Prior BP: 199/70 (09/08/2008)  Labs Reviewed: Creat: 0.70 (04/27/2008) Chol: 271 (10/28/2007)   HDL: 65 (10/28/2007)   LDL: 186 (10/28/2007)   TG: 101 (10/28/2007)   Problem # 2:  HYPERLIPIDEMIA (ICD-272.4) Has started lipitor.  Will check CMET and lipids at next appt.    Her updated medication list for this problem includes:    Lipitor 40 Mg Tabs (Atorvastatin calcium) .Marland Kitchen... Take one tablet daily for cholesterol.  Labs Reviewed: Chol: 271 (10/28/2007)   HDL: 65 (10/28/2007)   LDL: 186 (10/28/2007)   TG: 101 (10/28/2007) SGOT: 23 (04/27/2008)   SGPT: 22 (04/27/2008)  Future Orders: T-Comprehensive Metabolic Panel ([id number]) ... 10/20/2008 T-Lipid Profile [id number]) ... 10/20/2008   Problem # 3:  TOBACCO ABUSE (ICD-305.1) She is smoking 1/2ppd right now.  Encouraged her to cut back to 4 cigarets a day.  Problem # 4:  DIABETES MELLITUS, TYPE II (ICD-250.00) She has not been able to check cbgs because it hurts too badly.  She will see DR today to discuss.  Last A1C is 6.1.  I reminded her to wear shoes with a large toe box (the ones she has on today are better than the ones last week) and also to stop using the corn/wart pads on her feet.   Her updated medication list for this problem includes:    Glipizide Xl 10 Mg Tb24 (Glipizide) .Marland Kitchen... Take 1 tablet by mouth once a day    Lisinopril 20 Mg Tabs (Lisinopril) .Marland Kitchen... Take one tablet daily for blood pressure.  Orders: Capillary Blood Glucose [id number]) Fingerstick [id number])  Labs Reviewed: HgBA1c: 6.1 (09/08/2008)   Creat: 0.70 (04/27/2008)   Microalbumin: 74.20 (04/27/2008)   Complete Medication List: 1)  Glipizide Xl 10 Mg Tb24 (Glipizide) .... Take 1 tablet by mouth once a day 2)  Lisinopril 20 Mg Tabs (Lisinopril) .... Take one tablet daily for blood pressure. 3)  Ultracet Tabs (Tramadol-acetaminophen tabs) .... Take one tablet by mouth every 4hrs. as needed 4)  [facility]vs Vitamin B-6 50 Mg Tabs (Pyridoxine hcl) .... Take one tablet daily while you are taking isoniazid. 5)  Hydrochlorothiazide 25 Mg Tabs (Hydrochlorothiazide) .... Take one tablet daily for blood pressure. 6)  Norvasc 5 Mg Tabs (Amlodipine besylate) .... Take one tablet daily for blood pressure.  7)  Lipitor 40 Mg Tabs (Atorvastatin calcium) .... Take one tablet daily for cholesterol.   Patient Instructions: 1)  Please schedule a follow-up appointment in 1 month.   ]

## 2010-10-30 NOTE — Miscellaneous (Signed)
Summary: [name]: Diabetes Testing Supplies  [name]: Diabetes Testing Supplies   Imported By: Bonner Puna 03/24/2008 15:09:43  _____________________________________________________________________  External Attachment:    Type:   Image     Comment:   External Document

## 2010-10-30 NOTE — Assessment & Plan Note (Signed)
Summary: F/U DM, HTN, HLD, New constipation   Vital Signs:  Patient profile:   77 year old female Height:      66.75 inches (169.55 cm) Weight:      150.6 pounds (68.45 kg) BMI:     23.85 Temp:     97.1 degrees F (36.17 degrees C) oral Pulse rate:   59 / minute BP sitting:   158 / 57  (right arm) Cuff size:   regular  Vitals Entered By: Lucky Rathke NT II (August 16, 2010 1:37 PM) CC: MEDICATION REFILL ON ALL MED   / ROUTINE OFFICE VISIT / HAD RIGHT EYE SURGERY DONE Is Patient Diabetic? Yes Did you bring your meter with you today? No Nutritional Status BMI of 19 -24 = normal CBG Result 120  Have you ever been in a relationship where you felt threatened, hurt or afraid?No   Does patient need assistance? Functional Status Self care Ambulation Normal   Primary Care Provider:  Chilton Greathouse DO  CC:  MEDICATION REFILL ON ALL MED   / ROUTINE OFFICE VISIT / HAD RIGHT EYE SURGERY DONE.  History of Present Illness: Patient is a 77 yo female with a PMHx of DMII, HTN, HLD who presents to [facility] for med refills and follow-up of chronic medical conditions as follows:  1) Right Eye surgery for senile cataract and nuclear sclerosis and open-angle glaucoma in October 2011.  Left eye surgery planned in future. Notes right eye floaters, but no pain, no blurriness of vision. F/U on Monday with Dr. Johney Maine.   2) DM - last HgA1c 5.9 (05/2010). Not checking sugars regularly because does not have glucometer. Currently taking Glipizide XL 5 mg daily. No polyuria, polyphagia. Refill request.   3) HTN - not checking at home. No headaches, dizziness, lightheadedness, no chest pain, no palpitations, irregular heart beat. Taking Lisinopril regularly without problem. Refill request.  4) HLD - last FLP in 04/11 showed  TChol 237, LDL 145, Trigs 103. LFTs wnl 04/11. Needs refills today.  5) Constipation - BM approximately once weeky. No blood. No sick contacts, no abd pain, no nausea/  vomiting.  6) Tobacco abuse -   46 pack year smoking history. Now 1/2 ppd, down from 1 ppd in september. Patient states she is slowly trying to quit smoking. Not interested in help with smoking cessation at this time, will think about it.  Preventive Screening-Counseling & Management  Alcohol-Tobacco     Alcohol drinks/day: 0     Smoking Status: current     Smoking Cessation Counseling: yes     Packs/Day: 1/2     Year Started: 1943  Caffeine-Diet-Exercise     Does Patient Exercise: no  Current Medications (verified): 1)  Ultracet 37.5-325 Mg Tabs (Tramadol-Acetaminophen) .... Take One Tablet Every 6 Hours As Needed For Pain. 2)  Lisinopril 20 Mg Tabs (Lisinopril) .... Take One Tablet Daily For Blood Pressure. 3)  Pravachol 40 Mg Tabs (Pravastatin Sodium) .... Take One Tablet Daily To Lower Cholesterol. 4)  Glipizide Xl 5 Mg Xr24h-Tab (Glipizide) .... Take 1 Tablet By Mouth Once A Day  Allergies (verified): No Known Drug Allergies  Review of Systems       per HPI  Physical Exam  General:  Vital signs reviewed and noted. Well-developed,well-nourished,in no acute distress; alert,appropriate and cooperative throughout examination. Head: normocephalic, atraumatic. Neck: No deformities, masses, or tenderness noted. Lungs: Normal respiratory effort. Clear to auscultation BL without crackles or wheezes.  Heart: RRR. S1 and S2 normal without  gallop, murmur, or rubs.  Abdomen: BS normoactive. Soft, Nondistended, non-tender.  No masses or organomegaly. Extremities: No pretibial edema.     Impression & Recommendations:  Problem # 1:  CONSTIPATION (ICD-564.00) Assessment New Patient recommended to increase fiber in her diet, can consider Metamucil.  Problem # 2:  HYPERTENSION (ICD-401.9) Assessment: Deteriorated As patient is an AA female, she would likely benefit from addition of a thiazide diuretic for HTN management. - Will start Lisinopril-HCTZ 20-12.5mg  - Will check BMET in  1 month prior to f/u visit for assess renal function  The following medications were removed from the medication list:    Lisinopril 20 Mg Tabs (Lisinopril) .Marland Kitchen... Take one tablet daily for blood pressure. Her updated medication list for this problem includes:    Lisinopril-hydrochlorothiazide 20-12.5 Mg Tabs (Lisinopril-hydrochlorothiazide) .Marland Kitchen... Take 1 tablet by mouth once a day  Future Orders: T-Basic Metabolic Panel ([id number]) ... 09/13/2010  Problem # 3:  HYPERLIPIDEMIA (ICD-272.4) Assessment: Unchanged Lipid panel indicates LDL not optimally controlled for a diabetic patient. However, it was checked at a time where it seemed (by refills given) that she had not been taking medications regularly.  - Therefore, will not adjust Pravachol at this time, will monitor, and recheck FLP at next scheduled interval - Consider switching to Simvastatin, as it is a more potent statin, and is still on the [facility] $4, as cost is a concern for the patient.  Her updated medication list for this problem includes:    Pravachol 40 Mg Tabs (Pravastatin sodium) .Marland Kitchen... Take one tablet daily to lower cholesterol.  Problem # 4:  DIABETES MELLITUS, TYPE II (ICD-250.00) Assessment: Improved Patient was recently cut back of her Glipizide, and is doing well at current dose. Was previously tried on Metformin but with reaction of abd pain, nausea. - Continue current meds - If has frequent hypoglycemic episodes, will consider d/c glipizide, consider restart Metformin at a low dose. - Rx given for glucometer  The following medications were removed from the medication list:    Lisinopril 20 Mg Tabs (Lisinopril) .Marland Kitchen... Take one tablet daily for blood pressure. Her updated medication list for this problem includes:    Glipizide Xl 5 Mg Xr24h-tab (Glipizide) .Marland Kitchen... Take 1 tablet by mouth once a day    Lisinopril-hydrochlorothiazide 20-12.5 Mg Tabs (Lisinopril-hydrochlorothiazide) .Marland Kitchen... Take 1 tablet by mouth once a  day  Orders: Capillary Blood Glucose/CBG [id number])  Problem # 5:  Pillager (ICD-V70.0) Assessment: Comment Only Last colonoscopy: 12/2008 - 2 polyps (1 adenomatous, 1 hyperplastic) --> next due 12/2013 Last FLP: 12/2009 --> next due 12/2010 Last HgA1c:  05/2010 - 5.9 --> next due 08/2010 Last Microalbumin/Creatinine ratio: 05/2010 - 95.3 --> next due 06/2011 Last CMP:  12/2009 - wnl --> next due 12/2010 Last PAP: 09/2004 - Positive for T. vaginalis -->  hx of some "cancer" for which hysterectomy performed --> patient refuses PAP today, consider next visit as is still sexually active Last mammogram: 06/2010 - No evidence for malignancy --> Diagnostic mammography is recommended in 1 year.  Last foot exam: 05/2010 - high risk foot with multiple calluses -->   patient still to schedule appt with podiatry Last eye exam: unknown -->   request last report today  Problem # 6:  TOBACCO ABUSE (ICD-305.1) Assessment: Unchanged Patient wants to stop on her own, and refuses smoking cessation counseling.  - Will continue to encourage cessation, and readdress at each visit.  Complete Medication List: 1)  Ultracet 37.5-325 Mg Tabs (Tramadol-acetaminophen) .... Take  one tablet every 6 hours as needed for pain. 2)  Pravachol 40 Mg Tabs (Pravastatin sodium) .... Take one tablet daily to lower cholesterol. 3)  Glipizide Xl 5 Mg Xr24h-tab (Glipizide) .... Take 1 tablet by mouth once a day 4)  Lisinopril-hydrochlorothiazide 20-12.5 Mg Tabs (Lisinopril-hydrochlorothiazide) .... Take 1 tablet by mouth once a day 5)  Accu-chek Advantage Diabetes Kit (Blood glucose monitoring suppl) .... Check blood sugar 3-4 times weekly before breakfast 6)  Accu-chek Soft Touch Lancets Misc (Lancets) .... Check blood sugars 3-4 times weekly, before breakfast 7)  Accu-chek Advantage Test Strp (Glucose blood) .... Check blood sugars 3-4 times weekly, before breakfast 8)  Accu-chek Instant Control Liqd (Blood glucose  calibration) .... Use as directed.  Patient Instructions: 1)  Please follow-up at the clinic in 1 month, at which time we will reevaluate your blood pressure, cholesterol. 2)  You have been started on Lisinopril-HCTZ for your blood pressure. If you develop chest pain, difficulty breathing, tongue swelling, face swelling, please stop the medication and call the clinic at [phone]. 3)  Please bring all of your medications in a bag to your next visit.  Prescriptions: ACCU-CHEK INSTANT CONTROL  LIQD (BLOOD GLUCOSE CALIBRATION) use as directed.  #1 bottle x 11   Entered and Authorized by:   Chilton Greathouse DO   Signed by:   Chilton Greathouse DO on 08/16/2010   Method used:   Electronically to        [facility] (retail)       [address] [place]  [address]       Ph: [phone]       Fax: [phone]   RxID:   [id number] ACCU-CHEK ADVANTAGE TEST  STRP (GLUCOSE BLOOD) Check blood sugars 3-4 times weekly, before breakfast  #1 box x 11   Entered and Authorized by:   Chilton Greathouse DO   Signed by:   Chilton Greathouse DO on 08/16/2010   Method used:   Electronically to        [facility] (retail)       [address]       [place], [place]  [address]       Ph: [phone]       Fax: [phone]   RxID:   [id number] ACCU-CHEK SOFT TOUCH LANCETS  MISC (LANCETS) Check blood sugars 3-4 times weekly, before breakfast  #1 box x 11   Entered and Authorized by:   Chilton Greathouse DO   Signed by:   Chilton Greathouse DO on 08/16/2010   Method used:   Electronically to        [facility] (retail)       [address] [place],   [address]       Ph: [phone]       Fax: [phone]   RxID:   [id number] ACCU-CHEK ADVANTAGE DIABETES  KIT (BLOOD GLUCOSE MONITORING SUPPL) check blood sugar 3-4 times weekly before breakfast   #1 kit x 0   Entered and Authorized by:   Chilton Greathouse DO   Signed by:   Chilton Greathouse DO on 08/16/2010   Method used:   Electronically to        [facility] [phone]* (retail)       [place], [place]  [address]       Ph: [phone]       Fax: [phone]   RxID:   [id number] LISINOPRIL-HYDROCHLOROTHIAZIDE 20-12.5 MG TABS (LISINOPRIL-HYDROCHLOROTHIAZIDE) Take 1 tablet by mouth once a day  #30 x 5   Entered and Authorized by:   Chilton Greathouse DO   Signed by:   Chilton Greathouse DO on 08/16/2010   Method used:   Electronically to        [facility] (retail)       [address] [place], [place]  [address]       Ph: [phone]       Fax: [phone]   RxID:   [id number]    Orders Added: 1)  Capillary Blood Glucose/CBG [[id number]] 2)  T-Basic Metabolic Panel 0000000 3)  Est. Patient Level IV [id number]   Process Orders Check Orders Results:     [facility]: Check successful Tests Sent for requisitioning (August 21, 2010 4:02 AM):     09/13/2010: [facility] -- T-Basic Metabolic Panel 0000000 (signed)     Prevention & Chronic Care Immunizations   Influenza vaccine: Not documented   Influenza vaccine deferral: Refused  (08/16/2010)    Tetanus booster: Not documented   Td booster deferral: Refused  (08/16/2010)    Pneumococcal vaccine: Not documented   Pneumococcal vaccine deferral: Refused  (08/16/2010)    H. zoster vaccine: Not documented   H. zoster vaccine deferral: Refused  (08/16/2010)  Colorectal Screening   Hemoccult: Not documented    Colonoscopy: - Two polyps - s/p cold snare polypectomy at 70 cm and 20 cm REPORT OF SURGICAL PATHOLOGY 1. Colon, 70cm, polyp biopsy: Tubular adenoma (one fragment). No high grade dysplasia or malignancy identified.  2. Colon, 20cm, polyp, biopsy: Hyperplastic  polyps, polypoid fragments of benign colonic mucosa, no adenomatous changes, dysplasia, or malignancy identified.    Location:  [facility].  (01/10/2009)   Colonoscopy action/deferral: Repeat colonoscopy in 5 years.   (01/10/2009)   Colonoscopy due: 01/10/2014  Other Screening   Pap smear:  Specimen Adequacy: Satisfactory for evaluation.   Specimen Adequacy: Unsatisfactory for evaluation.  Interpretation/Result:Negative for intraepithelial Lesion or Malignancy.   Interpretation/Result:Trichomonas Vaginalis present.      (10/22/2004)   Pap smear action/deferral: Not indicated-other  (01/16/2010)   Pap smear due: 10/2006    Mammogram: BI-RADS CATEGORY 2:  Benign finding(s).^MM DIGITAL DIAGNOSTIC BILAT  (07/09/2010)    DXA bone density scan: Not documented  Reports requested:  Smoking status: current  (08/16/2010)   Smoking cessation counseling: yes  (08/16/2010)  Diabetes Mellitus   HgbA1C: 5.9  (06/18/2010)    Eye exam: Not documented   Last eye exam report requested.   Diabetic eye exam action/deferral: Deferred  (06/18/2010)    Foot exam: yes  (06/18/2010)   Foot exam action/deferral: Do today   High risk foot: Yes  (06/18/2010)   Foot care education: Done  (06/18/2010)   Foot exam due: 12/14/2008    Urine microalbumin/creatinine ratio: 95.7  (06/19/2010)   Urine microalbumin action/deferral: Ordered    Diabetes flowsheet reviewed?: Yes   Progress toward A1C goal: Improved  Lipids   Total Cholesterol: 237  (01/16/2010)   Lipid panel action/deferral: Lipid Panel  ordered   LDL: 145  (01/16/2010)   LDL Direct: Not documented   HDL: 71  (01/16/2010)   Triglycerides: 103  (01/16/2010)    SGOT (AST): 20  (01/16/2010)   BMP action: Ordered   SGPT (ALT): 17  (01/16/2010)   Alkaline phosphatase: 73  (01/16/2010)   Total bilirubin: 0.5  (01/16/2010)    Lipid flowsheet reviewed?: Yes   Progress toward LDL goal: Deteriorated  Hypertension   Last Blood  Pressure: 158 / 57  (08/16/2010)   Serum creatinine: 0.78  (01/16/2010)   BMP action: Ordered   Serum potassium 4.1  (01/16/2010)    Hypertension flowsheet reviewed?: Yes   Progress toward BP goal: Deteriorated  Self-Management Support :   Personal Goals (by the next clinic visit) :     Personal A1C goal: 7  (01/16/2010)     Personal blood pressure goal: 130/80  (01/16/2010)     Personal LDL goal: 100  (01/16/2010)    Diabetes self-management support: Resources for patients handout  (08/16/2010)   Last diabetes self-management training by diabetes educator: 09/21/2008    Hypertension self-management support: Resources for patients handout  (08/16/2010)    Lipid self-management support: Resources for patients handout  (08/16/2010)     Self-management comments: PATIENT STATES SHE WALKS TO THE BUS STOP      Resource handout printed.   Nursing Instructions: Request report of last diabetic eye exam    Last LDL:                                                 145 (01/16/2010 10:37:00 PM)        Diabetic Foot Exam    10-g (5.07) Semmes-Weinstein Monofilament Test Performed by: Lucky Rathke NT II          Right Foot          Left Foot Visual Inspection     normal           normal   Appended Document: F/U DM, HTN, HLD, New constipation I discussed the patient with Dr. Guy Sandifer and I agree with the assessment and plan as outlined above.

## 2010-10-30 NOTE — Assessment & Plan Note (Signed)
Summary: CHECKUP/SB.   Vital Signs:  Patient profile:   77 year old female Height:      66.75 inches (169.55 cm) Weight:      148.0 pounds (67.27 kg) BMI:     23.44 Temp:     96.8 degrees F oral Pulse (ortho):   65 / minute BP sitting:   155 / 64  (left arm)  Vitals Entered By: Morrison Old RN (January 16, 2010 9:00 AM) CC: Routine check-up.  Pain in hips when walking.  Med. refills. Is Patient Diabetic? Yes Did you bring your meter with you today? No Pain Assessment Patient in pain? no      Nutritional Status BMI of 19 -24 = normal CBG Result 105  Have you ever been in a relationship where you felt threatened, hurt or afraid?No   Does patient need assistance? Functional Status Self care   Primary Care Provider:  Dionicio Stall, MD  CC:  Routine check-up.  Pain in hips when walking.  Med. refills..  History of Present Illness: 54yof with pmh outlined in this chart here for check up.  She has no complaints.  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 She is fairly active has no chest pain, shortness of breath, decrease in exercise tolerance.  She maintains a healthy diet, does not check her cbg's.  She does mention some pain in her hip when she walks longer distances.  Would like ultracet refilled, she only takes it occasionally.    Preventive Screening-Counseling & Management  Alcohol-Tobacco     Alcohol drinks/day: 0     Smoking Status: current     Smoking Cessation Counseling: yes     Packs/Day: 1/2     Year Started: 1943  Caffeine-Diet-Exercise     Does Patient Exercise: no  Current Medications (verified): 1)  Glipizide Xl 10 Mg Tb24 (Glipizide) .... Take 1 Tablet By Mouth Once A Day 2)  Ultracet  Tabs (Tramadol-Acetaminophen Tabs) .... Take One Tablet By Mouth Every 4hrs. As Needed 3)  Hydrochlorothiazide 25 Mg  Tabs (Hydrochlorothiazide) .... Take One Tablet Daily For Blood Pressure.  Allergies (verified): No Known Drug Allergies  Review of Systems   per hpi  Physical Exam  General:  alert and well-developed.   Head:  normocephalic and atraumatic.   Eyes:  vision grossly intact, pupils equal, pupils round, and pupils reactive to light.   Mouth:  pharynx pink and moist.   Lungs:  normal respiratory effort and normal breath sounds.   Heart:  normal rate, regular rhythm, and no murmur.   Msk:  strength 5/5 Pulses:  +1 Extremities:  no edema Neurologic:  alert & oriented X3, cranial nerves II-XII intact, strength normal in all extremities, and sensation intact to light touch.   Psych:  Oriented X3, memory intact for recent and remote, normally interactive, and good eye contact.    Diabetes Management Exam:    Foot Exam (with socks and/or shoes not present):       Sensory-Pinprick/Light touch:          Left medial foot (L-4): normal          Left dorsal foot (L-5): normal          Left lateral foot (S-1): normal          Right medial foot (L-4): normal          Right dorsal foot (L-5): normal          Right lateral foot (S-1): normal  Sensory-Monofilament:          Left foot: normal          Right foot: normal       Sensory-other: sensation in tact       Inspection:          Left foot: abnormal             Comments: thick callus build up with hammer toes          Right foot: abnormal             Comments: thick callus build up with hammer toes.       Nails:          Left foot: thickened          Right foot: thickened   Impression & Recommendations:  Problem # 1:  DIABETES MELLITUS, TYPE II (ICD-250.00) A1C continues to be great at 6.3, even though she is not on metformin.  Will continue current regimen of glipizde with an eye towards dc'ing even that in the future. Could use a podiatrist for thick callus build up, but declined referal.  The following medications were removed from the medication list:    Metformin Hcl 500 Mg Tabs (Metformin hcl) .Marland Kitchen... Take 1 tablet by mouth two times a day Her updated medication list  for this problem includes:    Glipizide Xl 10 Mg Tb24 (Glipizide) .Marland Kitchen... Take 1 tablet by mouth once a day    Lisinopril 20 Mg Tabs (Lisinopril) .Marland Kitchen... Take one tablet daily for blood pressure.  Orders: T- Capillary Blood Glucose [id number]) T-Hgb A1C (in-house) [id number])  Labs Reviewed: Creat: 0.89 (10/24/2008)   Microalbumin: 5.2 (01/29/2006) Reviewed HgBA1c results: 6.7 (02/20/2009)  6.1 (09/08/2008)  Labs Reviewed: Creat: 0.89 (10/24/2008)   Microalbumin: 5.2 (01/29/2006) Reviewed HgBA1c results: 6.3 (01/16/2010)  6.7 (02/20/2009)  Problem # 2:  HYPERTENSION (ICD-401.9) Needs an ACE for protenuria, this will help BP as well.  Will start lisinopril today.  Her updated medication list for this problem includes:    Hydrochlorothiazide 25 Mg Tabs (Hydrochlorothiazide) .Marland Kitchen... Take one tablet daily for blood pressure.    Lisinopril 20 Mg Tabs (Lisinopril) .Marland Kitchen... Take one tablet daily for blood pressure.  BP today: 155/64 Prior BP: 175/63 (02/20/2009)  Labs Reviewed: K+: 4.1 (10/24/2008) Creat: : 0.89 (10/24/2008)   Chol: 145 (10/24/2008)   HDL: 63 (10/24/2008)   LDL: 66 (10/24/2008)   TG: 82 (10/24/2008)  Problem # 3:  HYPERLIPIDEMIA (ICD-272.4) Has not been taking lipitor, thought she was taken off of it.  Recheck lipids and cmet today.  The following medications were removed from the medication list:    Lipitor 40 Mg Tabs (Atorvastatin calcium) .Marland Kitchen... Take one tablet daily for cholesterol.  Orders: T-Lipid Profile [id number])  Problem # 4:  TOBACCO ABUSE (ICD-305.1) not smoking much and does not want to give up the few that she smokes.  Problem # 5:  BREAST CANCER, HX OF (ICD-V10.3) Mam 10/10 negative.  Complete Medication List: 1)  Glipizide Xl 10 Mg Tb24 (Glipizide) .... Take 1 tablet by mouth once a day 2)  Ultracet 37.5-325 Mg Tabs (Tramadol-acetaminophen) .... Take one tablet every 6 hours as needed for pain. 3)  Hydrochlorothiazide 25 Mg Tabs (Hydrochlorothiazide)  .... Take one tablet daily for blood pressure. 4)  Lisinopril 20 Mg Tabs (Lisinopril) .... Take one tablet daily for blood pressure.  Other Orders: T-Comprehensive Metabolic Panel ([id number])  Patient Instructions: 1)  You had labwork today, we will call  you if there is anything that needs to be addressed before your next appointment. 2)  Please schedule a follow-up appointment in 6 months. Prescriptions: ULTRACET 37.5-325 MG TABS (TRAMADOL-ACETAMINOPHEN) Take one tablet every 6 hours as needed for pain.  #90 x 3   Entered and Authorized by:   Myrtis Ser MD   Signed by:   Myrtis Ser MD on 01/16/2010   Method used:   Electronically to        [id number] (retail)       [place], [place]  [address]       Ph: [phone]       Fax: [phone]   [id number] LISINOPRIL 20 MG TABS (LISINOPRIL) Take one tablet daily for blood pressure.  #30 x 2   Entered and Authorized by:   Myrtis Ser MD   Signed by:   Myrtis Ser MD on 01/16/2010   Method used:   Electronically to        [facility] (retail)       [address] [place], [place]  [address]       Ph: [phone]       Fax: [phone]   [id number]   Prevention & Chronic Care Immunizations   Influenza vaccine: Not documented    Tetanus booster: Not documented    Pneumococcal vaccine: Not documented    H. zoster vaccine: Not documented  Colorectal Screening   Hemoccult: Not documented    Colonoscopy: Location:  [facility].    (01/10/2009)   Colonoscopy due: 12/2013  Other Screening   Pap smear:  Specimen Adequacy: Satisfactory for evaluation.   Specimen Adequacy: Unsatisfactory for evaluation.  Interpretation/Result:Negative for intraepithelial Lesion or Malignancy.   Interpretation/Result:Trichomonas Vaginalis present.      (10/22/2004)   Pap smear  action/deferral: Not indicated-other  (01/16/2010)   Pap smear due: 10/2006    Mammogram: BI-RADS CATEGORY 1:  Negative.^MM DIGITAL DIAGNOSTIC BILAT  (07/12/2009)    DXA bone density scan: Not documented   Smoking status: current  (01/16/2010)   Smoking cessation counseling: yes  (01/16/2010)  Diabetes Mellitus   HgbA1C: 6.3  (01/16/2010)    Eye exam: Not documented    Foot exam: yes  (01/16/2010)   High risk foot: Not documented   Foot care education: Not documented   Foot exam due: 12/14/2008    Urine microalbumin/creatinine ratio: 93.9  (02/20/2009)    Diabetes flowsheet reviewed?: Yes   Progress toward A1C goal: Unchanged  Lipids   Total Cholesterol: 145  (10/24/2008)   Lipid panel action/deferral: Lipid Panel ordered   LDL: 66  (10/24/2008)   LDL Direct: Not documented   HDL: 63  (10/24/2008)   Triglycerides: 82  (10/24/2008)    SGOT (AST): 19  (10/24/2008)   BMP action: Ordered   SGPT (ALT): 26  (10/24/2008) CMP ordered    Alkaline phosphatase: 62  (10/24/2008)   Total bilirubin: 0.6  (10/24/2008)    Lipid flowsheet reviewed?: Yes   Progress toward LDL goal: Unchanged  Hypertension   Last Blood Pressure: 155 / 64  (01/16/2010)   Serum creatinine: 0.89  (10/24/2008)   BMP action: Ordered   Serum potassium 4.1  (10/24/2008) CMP ordered     Hypertension flowsheet reviewed?:  Yes   Progress toward BP goal: Improved  Self-Management Support :   Personal Goals (by the next clinic visit) :     Personal A1C goal: 7  (01/16/2010)     Personal blood pressure goal: 130/80  (01/16/2010)     Personal LDL goal: 100  (01/16/2010)    Patient will work on the following items until the next clinic visit to reach self-care goals:     Medications and monitoring: take my medicines every day, bring all of my medications to every visit  (01/16/2010)     Eating: drink diet soda or water instead of juice or soda, eat more vegetables, use fresh or frozen vegetables, eat foods  that are low in salt, eat baked foods instead of fried foods, eat fruit for snacks and desserts  (01/16/2010)     Activity: take a 30 minute walk every day  (01/16/2010)    Diabetes self-management support: Education handout, Resources for patients handout, Written self-care plan  (01/16/2010)   Diabetes care plan printed   Diabetes education handout printed   Last diabetes self-management training by diabetes educator: 09/21/2008    Hypertension self-management support: Education handout, Resources for patients handout, Written self-care plan  (01/16/2010)   Hypertension self-care plan printed.   Hypertension education handout printed    Lipid self-management support: Education handout, Resources for patients handout, Written self-care plan  (01/16/2010)   Lipid self-care plan printed.   Lipid education handout printed      Resource handout printed.  Process Orders Check Orders Results:     [facility]: Check successful Tests Sent for requisitioning (January 16, 2010 9:59 AM):     01/16/2010: [facility] -- T-Lipid Profile [id number] (signed)     01/16/2010: [facility] -- T-Comprehensive Metabolic Panel [id number] (signed)    Laboratory Results   Blood Tests   Date/Time Received: January 16, 2010 9:21 AM  Date/Time Reported: Lenoria Farrier  January 16, 2010 9:21 AM   HGBA1C: 6.3%   (Normal Range: Non-Diabetic - 3-6%   Control Diabetic - 6-8%) CBG Random:: 105mg /dL

## 2010-10-30 NOTE — Assessment & Plan Note (Signed)
Summary: pain med interaction/gg   Vital Signs:  Patient Profile:   77 Years Old Female Height:     66.75 inches (169.55 cm) Weight:      169.4 pounds (77 kg) BMI:     26.83 Temp:     97.4 degrees F (36.33 degrees C) oral Pulse rate:   72 / minute BP sitting:   173 / 67  (right arm) Cuff size:   large  Vitals Entered By: Lucky Rathke NT II (March 28, 2008 2:36 PM)             Is Patient Diabetic? Yes  Nutritional Status BMI of 25 - 29 = overweight CBG Result 113  Have you ever been in a relationship where you felt threatened, hurt or afraid?No   Does patient need assistance? Functional Status Self care Ambulation Normal     PCP:  Dionicio Stall, MD  Chief Complaint:  ROUTINE OFFICE VISIT / STILL HAVING PAIN IN BILATERAL HIP (DR LAWSON).  History of Present Illness: Ms. Bartlette is a pleasant 77 y/o woman with a h/o Breast Cancer, HTN, DM and most recently a positive PPD on Isoniazid who presents to the clinic today as she was informed of a potential interaction between Tramadol and Isoniazid. Pt is willing to try Ibuprofen for pain control.  She has also stopped taking lisinopril for an unknown amount of time. I believe, she just forgot to take them.  No complaints for today.     Updated Prior Medication List: GLIPIZIDE XL 10 MG TB24 (GLIPIZIDE) Take 1 tablet by mouth once a day LISINOPRIL 10 MG  TABS (LISINOPRIL) Take 1 tablet by mouth once a day ULTRACET  TABS (TRAMADOL-ACETAMINOPHEN TABS) take one tablet by mouth every 4hrs. as needed ISONIAZID 300 MG  TABS (ISONIAZID) Take one tablet daily for 6 months. HYDROCHLOROTHIAZIDE 12.5 MG  TABS (HYDROCHLOROTHIAZIDE) one tab daily  Current Allergies: No known allergies     Risk Factors:  Tobacco use:  current    Year started:  1943    Cigarettes:  Yes -- 1/2 pack(s) per day Alcohol use:  no Exercise:  no Seatbelt use:  100 %  PAP Smear History:    Date of Last PAP Smear:  10/22/2004   Review of Systems   The patient denies fever, chest pain, syncope, dyspnea on exertion, peripheral edema, and abdominal pain.     Physical Exam  General:     alert, well-developed, and well-nourished.   Head:     atraumatic.   Eyes:     pupils equal, pupils round, and pupils reactive to light.   Mouth:     pharynx pink and moist.   Neck:     supple.   Lungs:     normal respiratory effort, normal breath sounds, no crackles, and no wheezes.   Heart:     normal rate, regular rhythm, no murmur, no gallop, and no rub.   Abdomen:     soft, non-tender, normal bowel sounds, and no distention.   Extremities:     No clubbing, cyanosis, edema, or deformity noted with normal full range of motion of all joints.   Skin:     Intact without suspicious lesions or rashes Cervical Nodes:     No lymphadenopathy noted Psych:     Appropriate    Impression & Recommendations:  Problem # 1:  HYPERTENSION (ICD-401.9) BP not at goal. Will check a BMET today and restart Lisinopril. Repeat BMET in one week after starting lisinopril  especially since pt will also be starting Ibuprofen(NSAID) which can potentially cause renal dysfunction in an elderly individual with likely a compromised GFR.  Her updated medication list for this problem includes:    Lisinopril 10 Mg Tabs (Lisinopril) .Marland Kitchen... Take 1 tablet by mouth once a day    Hydrochlorothiazide 12.5 Mg Tabs (Hydrochlorothiazide) ..... One tab daily  Orders: T-Basic Metabolic Panel ([id number])  Future Orders: T-Basic Metabolic Panel ([id number]) ... 04/04/2008  BP today: 173/67 Prior BP: 188/72 (12/29/2007)  Labs Reviewed: Creat: 0.70 (10/28/2007) Chol: 271 (10/28/2007)   HDL: 65 (10/28/2007)   LDL: 186 (10/28/2007)   TG: 101 (10/28/2007)   Problem # 2:  DIABETES MELLITUS, TYPE II (ICD-250.00)  Ideally would like her HgbA1C less than 7. Hence, counselled pt regarding better diet control. If HgbA1C still elevated at following visit, may consider starting Metformin.  Her updated medication list for this problem includes:    Glipizide Xl 10 Mg Tb24 (Glipizide) .Marland Kitchen... Take 1 tablet by mouth once a day    Lisinopril 10 Mg Tabs (Lisinopril) .Marland Kitchen... Take 1 tablet by mouth once a day  Orders: T- Capillary Blood Glucose [id number]) T-Hgb A1C (in-house) [id number])  Labs Reviewed: HgBA1c: 7.5 (03/28/2008)   Creat: 0.70 (10/28/2007)   Microalbumin: 5.2 (01/29/2006)   Problem # 3:  POSITIVE PPD (ICD-795.5) On Isoniazid. CT Chest did not show any acute lung process. Mildly enlarged Rt paratracheal LN, likely reactive requiring follow up CT Chest in 82months. Will switch Tramadol to Ibuprofen 800mg  q 8 hrs to avoid interactions i.e increases toxic effects of tramadol.   Complete Medication List: 1)  Glipizide Xl 10 Mg Tb24 (Glipizide) .... Take 1 tablet by mouth once a day 2)  Lisinopril 10 Mg Tabs (Lisinopril) .... Take 1 tablet by mouth once a day 3)  Ultracet Tabs (Tramadol-acetaminophen tabs) .... Take one tablet by mouth every 4hrs. as needed 4)  Isoniazid 300 Mg Tabs (Isoniazid) .... Take one tablet daily for 6 months. 5)  Hydrochlorothiazide 12.5 Mg Tabs (Hydrochlorothiazide) .... One tab daily 6)  Ibuprofen 800 Mg Tabs (Ibuprofen) .... Take 1 tablet by mouth every 8 hours as needed   Patient Instructions: 1)  Please schedule a follow-up appointment in 1 month with Dr. Volanda Napoleon. 2)  Please return to the clinic in one week for blood test only.  3)  Check your feet each night for sore areas, calluses or signs of infection. 4)  START taking Lisinopril 10mg  one pill daily for blood pressure.  5)  If you feel that you are having symptoms of reflux, burning sensation in your chest after the start of Ibuprofen, please inform us.    Prescriptions:  LISINOPRIL 10 MG  TABS (LISINOPRIL) Take 1 tablet by mouth once a day  #31 x 3   Entered and Authorized by:   Dionicio Stall MD   Signed by:   Dionicio Stall MD on 03/28/2008   Method used:   Electronically sent to ...       [facility]       [place], [place]  [address]       Ph: [phone]       Fax: [phone]   [id number] IBUPROFEN 800 MG  TABS (IBUPROFEN) Take 1 tablet by mouth every 8 hours as needed  #90 x 2   Entered and Authorized by:   Dionicio Stall MD  Signed by:   Dionicio Stall MD on 03/28/2008   Method used:   Electronically sent to ...       [facility]       [address]       [place], [place]  [address]       Ph: [phone]       Fax: [phone]   RxID:   [id number]  ] Laboratory Results   Blood Tests   Date/Time Received: March 28, 2008 2:59 PM  Date/Time Reported: Melvia Heaps  March 28, 2008 3:00 PM   HGBA1C: 7.5%   (Normal Range: Non-Diabetic - 3-6%   Control Diabetic - 6-8%) CBG Random:: 113mg /dL

## 2010-10-30 NOTE — Miscellaneous (Signed)
Summary: Orders Update - Order for mammogram  Clinical Lists Changes  Orders: Added new Test order of Mammogram (Diagnostic) (Mammo) - Signed

## 2010-10-30 NOTE — Progress Notes (Signed)
Summary: Refill request/dms  Phone Note Refill Request Message from:  Fax from Pharmacy on March 03, 2007 8:42 AM  Refills Requested: Medication #1:  ULTRACET  TABS take one tablet by mouth every 4hrs. as needed   Last Refilled: 11/22/2006   Notes: received 120 on 09/09/06  Method Requested: Fax to [facility] Initial call taken by: Pollyann Samples,  March 03, 2007 8:42 AM  Follow-up for Phone Call        Patient has no pain contract and no diagnosis corresponding to the need for chronic analgesics.  Will refill one month supply and have pt be seen by new PCP in next available slot.   Follow-up by: Lucy Chris MD,  March 03, 2007 12:51 PM  Additional Follow-up for Phone Call Additional follow up Details #1::        Rx faxed to pharmacy and flag sent to [facility] to schedule appt.  Additional Follow-up by: Pollyann Samples,  March 03, 2007 1:08 PM    Prescriptions: ULTRACET  TABS (TRAMADOL-ACETAMINOPHEN TABS) take one tablet by mouth every 4hrs. as needed  #120 x 0   Entered and Authorized by:   Lucy Chris MD   Signed by:   Lucy Chris MD on 03/03/2007   Method used:   Telephoned to ...       [facility]       [place], [place]  [address]       Ph: [phone]       Fax: [phone]   RxID:   [id number]

## 2010-10-30 NOTE — Assessment & Plan Note (Signed)
Summary: DM TRAINING/DS      Current Allergies: No known allergies           ]  Diabetes Self Management Training  PCP:  Myrtis Ser MD Referring MD: Volanda Napoleon Diabetes Type: Type 2 non-insulin treated Current smoking Status: current  Assessment Daily activities: likes to stay busy Sources of Support: family- brother has diabetes-"bad" Special needs or Barriers: ? memory vs cognition  Diabetes Medications:  Comments: has been instructed how to use the lancing device and her meter several times and seems to have difficulty with thei task. reveiwed with her today and she was able to perform a CBg on herself. Offered an alternative lancing device as the one she has is more difficult to use- but she denied need at this time. encouraged her to check her CBg tonight before dinner to reinforce what she learned today,  then once daily starting tomorrow    Recent Episodes of: Hyperglycemia : No Hypoglycemia: No Severe Hypoglycemia : No      Diabetes Disease Process State own type of diabetes: Needs review/assistance State diabetes is treated by meal plan-exercise-medication-monitoring-education: Needs review/assistance  Monitoring State purpose and frequency of monitoring BG-ketones-HgbA1C and when to contact health care team with results: Needs review/assistance Perform glucose monitoring/ketone testing and record results correctly: Demonstrates competency State target blood glucose and HgbA1C goals: Needs review/assistance  Complications State the causes- signs and symptoms and prevention of hypoglycemia: Needs review/assistance Explain proper treatment of hypoglycemia: Needs review/assistance State the principles of skin-dental and foot care: Demonstrates competency Describe symptoms of skin and foot problems and describe foot exam: Needs review/assistance State when to seek medical advice and treatment: Demonstrates competency   Lifestyle changes:Goal setting and Problem solving State benefits of making appropriate lifestyle changes: Needs review/assistance Identify lifestyle behaviors that need to change: Needs review/assistance Identify risk factors that interfere with health: Needs review/assistance Develop strategies to reduce risk factors: Needs review/assistance List at least two appropriate community resources: Demonstrates competency  Psychosocial Adjustment Diabetes Management Education Done: 09/21/2008 Date Follow-up completed: 09/21/2008    BEHAVIORAL GOALS INITIAL Monitoring blood glucose levels daily: check once daily and anytime you feel dizzy. Bring meter to all visits here in clinic   Specific goal set today: did not follow- up on smoking today. will defer to PCP      Educated about necessity for properly fitting shoes- large toe box, support, etc. Rx given from Dr. Volanda Napoleon for shoes at [facility]. ( shoes she has on today arebetter than the high heel, pointed toe shoes she had on before, but are too tight in toes) Diabetes Self Management Support: clinic staff Time spent: 30 minutes Follow-up: 3-6 months Last LDL:                                                 186 (10/28/2007 10:04:00 PM)

## 2010-10-30 NOTE — Letter (Signed)
Summary: [facility]   Imported By: Bonner Puna 11/20/2007 15:03:43  _____________________________________________________________________  External Attachment:    Type:   Image     Comment:   External Document

## 2010-10-30 NOTE — Progress Notes (Signed)
Summary: refill/gg  Phone Note Refill Request  on March 26, 2007 10:53 AM  Refills Requested: Medication #1:  GLIPIZIDE XL 10 MG TB24 Take 1 tablet by mouth once a day   Last Refilled: 02/10/2007 labs 7/07  Initial call taken by: Gevena Cotton RN,  March 26, 2007 10:54 AM  Follow-up for Phone Call        Refill approved-nurse to complete. However, I will only give 1 refill. She needs to be seen in [facility]. Follow-up by: Efraim Kaufmann MD,  March 26, 2007 11:28 AM  Additional Follow-up for Phone Call Additional follow up Details #1::        Rx faxed to pharmacy Additional Follow-up by: Gevena Cotton RN,  March 26, 2007 12:34 PM    Prescriptions: GLIPIZIDE XL 10 MG TB24 (GLIPIZIDE) Take 1 tablet by mouth once a day  #31 x 1   Entered and Authorized by:   Efraim Kaufmann MD   Signed by:   Efraim Kaufmann MD on 03/26/2007   Method used:   Telephoned to ...       [facility]       [place], [place]  [address]       Ph: [phone]       Fax: [phone]   RxID:   [id number]

## 2010-10-30 NOTE — Letter (Signed)
Summary: [facility]  [facility]   Imported By: Bonner Puna 12/05/2008 12:16:43  _____________________________________________________________________  External Attachment:    Type:   Image     Comment:   External Document

## 2010-10-30 NOTE — Progress Notes (Signed)
  Phone Note Call from Patient Call back at [facility] Phone [phone]   Caller: Patient Call For: nurse Reason for Call: Talk to Nurse Summary of Call: Pt stated that she had TB skin test 2 weeks ago. Her employer told her that the results were ok.,but that she may want to have it repeated.  Pt unsure of what to do.  Undersigned called employer,[employer], they stated test area was red not raised and that Gina [name] needed to do nothing.  They would repeat test in 6 months if needed. Pt was called back and informed of what employer stated. Initial call taken by: Gerlean Ren RN,  October 22, 2006 10:53 AM

## 2010-10-30 NOTE — Assessment & Plan Note (Signed)
Summary: F/U CHEST X-RAY NEEDS BONE SCAN PER DR PHIFER/[facility]B   Vital Signs:  Patient Profile:   77 Years Old Female Height:     66.75 inches (169.55 cm) Weight:      165.06 pounds (75.03 kg) BMI:     26.14 Temp:     98.1 degrees F (36.72 degrees C) oral Pulse rate:   70 / minute BP sitting:   203 / 73  (right arm)  Pt. in pain?   no  Vitals Entered By: Gina Nephew RN (November 23, 2007 1:56 PM)              Is Patient Diabetic? Yes  Nutritional Status BMI of 25 - 29 = overweight CBG Result 230  Does patient need assistance? Functional Status Self care Ambulation Normal     PCP:  Gina Stall, MD  Chief Complaint:  B/P 187/76, B/P recheck, dizziness since yesterday, Nausea, and .  History of Present Illness: This is a 77 year old female with a history of HTN, DM II, tobacco abuse, and breast cancer. She comes in for follow up of a positive PPD test. She has recently been placed on isoniazid without complications. She did have a recent CXR that showed a possible granuloma, and a stable rib nodule on her left 5th rib. She currently denies cough, chest pain, fatigue and/or significant weight loss. She has had in the past 2-3 occassional episodes of dizziness while standing  in addition to several daily episodes non-bloody diarrhea and nausea. These symptoms coincide with her restarting metformin recently. Originally, she was given this medication last year but stopped taling it after it caused nausea and diarrhea. She was again urged to take this medication at her last visit and it appears that this medication again is causing the patient problems.     Prior Medication List:  GLIPIZIDE XL 10 MG TB24 (GLIPIZIDE) Take 1 tablet by mouth once a day LISINOPRIL 10 MG  TABS (LISINOPRIL) Take 1 tablet by mouth once a day ULTRACET  TABS (TRAMADOL-ACETAMINOPHEN TABS) take one tablet by mouth every 4hrs. as needed  METFORMIN HCL 500 MG  TABS (METFORMIN HCL) Take one tablet two times a day. ISONIAZID 300 MG  TABS (ISONIAZID) Take one tablet daily for 6 months.   Current Allergies: No known allergies   Past Medical History:    Reviewed history from 04/08/2007 and no changes required:       S/p Rt partial Mastectomy for intraductal Ca of the Rt breast (5/05)       H/o PVD S/p Lt Femoral-popliteal bypass Sx 5/04       Diabetes mellitus, type II       Hypertension       H/o Hyperlipidemia: not on any medications       S/p Hystrectomy for ?cancer/mass       Latent Tb- treated with isoniazid starting in 11/2007    Risk Factors:  Tobacco use:  current    Year started:  1945    Cigarettes:  Yes -- 1/2 pack(s) per day    Counseled to quit/cut down tobacco use:  yes Alcohol use:  no Exercise:  no Seatbelt use:  100 %  PAP Smear History:    Date of Last PAP Smear:  10/22/2004   Review of Systems  The patient denies anorexia, fever, weight loss, chest pain, syncope, dyspnea on exhertion, prolonged cough, and hemoptysis.     Physical Exam  General:     alert and well-developed.  Head:     normocephalic and atraumatic.   Eyes:     vision grossly intact.   Neck:     supple and no cervical lymphadenopathy.   Lungs:     normal respiratory effort, no accessory muscle use, normal breath sounds, and no crackles.   Heart:     normal rate, regular rhythm, no murmur, and no gallop.   Abdomen:     soft, non-tender, normal bowel sounds, and no distention.   Neurologic:     strength normal in all extremities.   Psych:     memory intact for recent and remote and flat affect.      Impression & Recommendations:  Problem # 1:  POSITIVE PPD (ICD-795.5)  She has begin her course of isoniazid for latent Tb. She is being followed at the health department for this condition. She is doing well and not complaining of any fevers, night sweats, coughing, weight loss or fatigue. She needs a set of LFTS periodically this year and her initial set were normal. She needs a total of 9 months of treatment. She also needs to take her B6.   Problem # 2:  HYPERTENSION (ICD-401.9) Her BP is poorly controlled. She did not take her BP meds this am because of dizziness. She likely has been withouther BP medication for longer than that given her current systolics. I will have her stop her metformin which should resolve her dizziness and start HCTZ immediately after. She also needs to restart her lisinopril then. I will have her come back in one month for a BP check but I let her know that she needs to come back sooner if her dizzines does not resolve.  Her updated medication list for this problem includes:    Lisinopril 10 Mg Tabs (Lisinopril) .Marland Kitchen... Take 1 tablet by mouth once a day    Hydrochlorothiazide 12.5 Mg Tabs (Hydrochlorothiazide) ..... One tab daily   Problem # 3:  DIABETES MELLITUS, TYPE II (ICD-250.00) Assessment: Unchanged She has been prescribed metformin in the past but she has not taken it regularly secondary to nausea, diarrhea and dizziness. She recently started taking it once again but these symptoms recurred. I will have her stop this medication. Her HgA1c was 7.1 while taking only glipzide recently in January so I feel that she will do well in the future without metformin.  The following medications were removed from the medication list:    Metformin Hcl 500 Mg Tabs (Metformin hcl) .Marland Kitchen... Take one tablet two times a day.  Her updated medication list for this problem includes:    Glipizide Xl 10 Mg Tb24 (Glipizide) .Marland Kitchen... Take 1 tablet by mouth once a day    Lisinopril 10 Mg Tabs (Lisinopril) .Marland Kitchen... Take 1 tablet by mouth once a day    Problem # 4:  DIZZINESS (ICD-780.4) Assessment: Unchanged She has had recent dizziness. This symptom along with nausea and diarrhea have occured when she tried to taken metformin in the past. Nausea and diarrhea are common side effects of this medication. I will discontinue metformin and follow up on her symptoms in one month. I informed her that if her symptoms persisted or worsened that she should give the clinic a call.   Complete Medication List: 1)  Glipizide Xl 10 Mg Tb24 (Glipizide) .... Take 1 tablet by mouth once a day 2)  Lisinopril 10 Mg Tabs (Lisinopril) .... Take 1 tablet by mouth once a day 3)  Ultracet Tabs (Tramadol-acetaminophen tabs) .... Take one tablet  by mouth every 4hrs. as needed 4)  Isoniazid 300 Mg Tabs (Isoniazid) .... Take one tablet daily for 6 months. 5)  Hydrochlorothiazide 12.5 Mg Tabs (Hydrochlorothiazide) .... One tab daily  Other Orders: Future Orders: Radiology other (Radiology Other) ... 11/26/2007   Patient Instructions: 1)  Please schedule a follow-up appointment in 1 month. 2)  Please take your medications as prescribed.  3)  Please stop taking your metformin.  4)  We will call you with your appointment for your bone scan.     Prescriptions: HYDROCHLOROTHIAZIDE 12.5 MG  TABS (HYDROCHLOROTHIAZIDE) one tab daily  #30 x 6   Entered and Authorized by:   Caroleen Hamman MD   Signed by:   Caroleen Hamman MD on 11/23/2007   Method used:   Print then Give to Patient   RxID:   [id number] HYDROCHLOROTHIAZIDE 12.5 MG  TABS (HYDROCHLOROTHIAZIDE) one tab daily  #30 x 6   Entered and Authorized by:   Caroleen Hamman MD   Signed by:   Caroleen Hamman MD on 11/23/2007   Method used:   Print then Give to Patient   [id number]  ]

## 2010-10-30 NOTE — Consult Note (Signed)
Summary: [facility]: TB Control  [facility]: TB Control   Imported By: Bonner Puna 12/28/2007 15:08:59  _____________________________________________________________________  External Attachment:    Type:   Image     Comment:   External Document

## 2010-10-30 NOTE — Progress Notes (Signed)
Summary: [facility]  Phone Note Other Incoming   Call placed by: Health Dept. Details for Reason: INH Summary of Call: Received a call from the Health Dept. stating pt. has decided to stop taking INH (for positive PPD) because it made her dizzy.   And that she might try taking it again later on.  But for now, she will not take it even after  the Health Dept. tried to stress  its importance. Initial call taken by: Morrison Old RN,  December 23, 2007 3:21 PM         Appended Document: [facility] Dept[facility]  Clinical Lists Changes  Problems: Assessed POSITIVE PPD as comment only - Pt has currently undegone a 2 month treatment with INH for latent TB. She reported dizziness with INH on 12/21/07 to the health dept and is now refusing to take anymore INH. On speaking with Mrs. Rogers at [facility] [employer], I was informed that Dr. Kilpatrick(physician who visits the Health Dep twice a month) had mentioned back in 1/09 that Ms. Jackowski needed a CT Chest and Bone scan to evaluate the increasing LLL granuloma for malignancy considering that pt is a smoker and has a h/o breast Ca. I believe a bone scan was ordered 2/09 but there are no results on Echart. Reports from the Health Dept have been faxed over again(which will be scanned into EMR) and pt needs a CT Chest and Bone scan and maybe even a bronch for biopsy if the above are not productive in obtaining a final diagnosis i.e active TB vs malignancy. 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 Pt was called and informed of the above and she has an appointment with Dr. Delman Kitten on 12/29/07 at 10.15am. Also pt's son was informed at [phone] to make sure pt keeps her appointment.    Assessed DIZZINESS as comment only - DD broad, given this is a 77 y/o woman. Initially this was thought to be 2/2 Metformin but pt did not follow up in the clinic for further evaluation. This needs to be evaluated at next visit. If another cause is found, then pt probably can take [place]!!        Impression & Recommendations:  Problem # 1:  POSITIVE PPD (ICD-795.5) Pt has currently undegone a 2 month treatment with INH for latent TB. She reported dizziness with INH on 12/21/07 to the health dept and is now refusing to take anymore INH. On speaking with Mrs. Rogers at [facility] [employer], I was informed that Dr. Kilpatrick(physician who visits the Health Dep twice a month) had mentioned back in 1/09 that Ms. Goldfarb needed a CT Chest and Bone scan to evaluate the increasing LLL granuloma for malignancy considering that pt is a smoker and has a h/o breast Ca. I believe a bone scan was ordered 2/09 but there are no results on Echart. Reports from the Health Dept have been faxed over again(which will be scanned into EMR) and pt needs a CT Chest and Bone scan and maybe even a bronch for biopsy if the above are not productive in obtaining a final diagnosis i.e active TB vs malignancy. 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 Pt was called and informed of the above and she has an appointment with Dr. Delman Kitten on 12/29/07 at 10.15am. Also pt's son was informed at [phone] to make sure pt keeps her appointment.  Problem # 2:  DIZZINESS (ICD-780.4) DD broad, given this is a 77 y/o woman. Initially this was thought to be 2/2 Metformin but pt did not follow up in the clinic for further evaluation. This needs to be evaluated at next visit. If another cause is found, then pt probably can take [place]!!  Complete Medication List: 1)  Glipizide Xl 10 Mg Tb24 (Glipizide) .... Take 1 tablet by mouth once a day  2)  Lisinopril 10 Mg Tabs (Lisinopril) .... Take 1 tablet by mouth once a day 3)  Ultracet Tabs (Tramadol-acetaminophen tabs) .... Take one tablet by mouth every 4hrs. as needed 4)  Isoniazid 300 Mg Tabs (Isoniazid) .... Take one tablet daily for 6 months. 5)  Hydrochlorothiazide 12.5 Mg Tabs (Hydrochlorothiazide) .... One tab daily

## 2010-10-30 NOTE — Progress Notes (Signed)
----   Converted from flag ---- ---- 01/18/2010 12:04 PM, Morrison Old RN wrote: Pt.was called and made awared of Rx for high cholesterol and needs to come for fasting lab in 6-8 weeks per Dr. Volanda Napoleon.  ---- 01/18/2010 9:50 AM, Myrtis Ser MD wrote: could you please call and let Ms. Ambriz know that her cholesterol is high.  I have written for pravachol and it should be at her pharmacy.  She should come back to clinic in 6-8 wks for repeat fasting lipids, needs to make a lab appointment for this, will not need to see an MD.  Thanks,  catherine ------------------------------

## 2010-10-30 NOTE — Miscellaneous (Signed)
Summary: Dr. Ricki Miller  Dr. Ricki Miller   Imported By: Bonner Puna 10/17/2008 15:00:50  _____________________________________________________________________  External Attachment:    Type:   Image     Comment:   External Document

## 2010-10-30 NOTE — Consult Note (Signed)
Summary: [facility]: TB Control  [facility]: TB Control   Imported By: Bonner Puna 03/23/2008 15:48:14  _____________________________________________________________________  External Attachment:    Type:   Image     Comment:   External Document

## 2010-10-30 NOTE — Progress Notes (Signed)
Summary: Refill/gh  Phone Note Refill Request Message from:  Patient on October 20, 2007 10:00 AM  Refills Requested: Medication #1:  GLIPIZIDE XL 10 MG TB24 Take 1 tablet by mouth once a day Pt rscheuled from today   Method Requested: Electronic Initial call taken by: Sander Nephew RN,  October 20, 2007 10:01 AM  Follow-up for Phone Call        Rx written Follow-up by: Rhina Brackett Dam MD,  October 20, 2007 10:10 AM      Prescriptions: GLIPIZIDE XL 10 MG TB24 (GLIPIZIDE) Take 1 tablet by mouth once a day  #31 x 5   Entered and Authorized by:   Alcide Evener MD   Signed by:   Rhina Brackett Dam MD on 10/20/2007   Method used:   Electronically sent to ...       [id number]       [address]       [place], [place]  [address]       Ph: [phone]       Fax: [phone]   RxID:   [id number]

## 2010-10-30 NOTE — Assessment & Plan Note (Signed)
Summary: Sx clearance for cataract sx, DM, HTN, HLD   Vital Signs:  Patient profile:   77 year old female Height:      66.75 inches (169.55 cm) Weight:      150.2 pounds (68.27 kg) BMI:     23.79 Temp:     98.1 degrees F (36.72 degrees C) oral Pulse rate:   75 / minute BP sitting:   150 / 78  (left arm) Cuff size:   regular  Vitals Entered By: Lucky Rathke NT II (June 18, 2010 3:10 PM) CC: PATIENT IS HERE FOR SURGICAL CLEARANCE FOR EYE SURGERY /  MEDICATION REFILL, Lipid Management, Back Pain Is Patient Diabetic? Yes Did you bring your meter with you today? No Pain Assessment Patient in pain? no      Nutritional Status BMI of 19 -24 = normal CBG Result 105  Have you ever been in a relationship where you felt threatened, hurt or afraid?No   Does patient need assistance? Functional Status Self care Ambulation Normal Comments SURGICAL CLEARNCE FOR EYE SURGERY  /  MEDICATION REFILL   Diabetic Foot Exam Foot Inspection Is there a history of a foot ulcer?              No Is there a foot ulcer now?              No Can the patient see the bottom of their feet?          Yes Are the shoes appropriate in style and fit?          No Is there swelling or an abnormal foot shape?          No Are the toenails long?                No Are the toenails thick?                Yes Are the toenails ingrown?              No Is there heavy callous build-up?              Yes Is there pain in the calf muscle (Intermittent claudication) when walking?    NoIs there a claw toe deformity?              No Is there elevated skin temperature?            No Is there limited ankle dorsiflexion?            No Is there foot or ankle muscle weakness?            No  Diabetic Foot Care Education Patient educated on appropriate care of diabetic feet.  Pulse Check          Right Foot          Left Foot Posterior Tibial:        diminished            diminished Dorsalis Pedis:        diminished             diminished  High Risk Feet? Yes   10-g (5.07) Semmes-Weinstein Monofilament Test Performed by: Chilton Greathouse DO          Right Foot          Left Foot Test Control      normal         normal Site 1  normal         normal Site 2         normal         normal Site 3         normal         normal Site 4         abnormal         abnormal Site 5         normal         normal Site 6         normal         normal Site 7         normal         normal Site 8         normal         normal Site 9         abnormal         normal Site 10         normal         normal  Impression      abnormal         abnormal   Primary Care Provider:  Chilton Greathouse DO  CC:  PATIENT IS HERE FOR SURGICAL CLEARANCE FOR EYE SURGERY /  MEDICATION REFILL, Lipid Management, and Back Pain.  History of Present Illness: Pt is a 77yo female with PMHx of HTN, HLD, DMII who presents to clinic today with multiple medical concerns, including the following: 1) Surgical clearance - Eye surgery anticipated soon for senile cataract and nuclear sclerosis and open-angle glaucoma. Plan right eye surgery, then left eye surgery. Patient denies CP or breathlessness after climbing 2 flights of stairs at normal speed. Denies history of kidney disease, no liver disease, no seizure disorder, no hx of asthma, no prior adverse reaction to anesthesia in self or family, no easy bleeding or bruising, no urinary symptoms.  2) DM - last HgA1c 6.3 (12/2009). Patient ususally checking blood sugars 1 time daily. But states having problems with meter since August 2011. Currently taking Glipizide XL 10mg  daily. Has not tried Metformin previously. No polyuria, polyphagia. Ate a hamburger before visit, CBG 105 mg/dL. Refill request  3) HTN - not checking at home. No headaches, dizziness, lightheadedness, no chest pain, no palpitations, irregular heart beat. Taking medications regularly without problem. Refill request.  4) HLD -  last FLP in 04/11 showed  TChol 237, LDL 145, Trigs 103. LFTs wnl 04/11. Needs refills today.  5) Loose stools x 1 day - 2 loose BM yesterday, no blood, mucous, no abundant malodor. States happened after ate too much of her fruit cake. No sick contacts, no abd pain, no nausea/ vomiting.  6) Tobacco abuse -   46 pack year smoking history. Patient states she is slowly trying to quit smoking. Not interested in help with smoking cessation at this time, will think about it.   Lipid Management History:      Positive NCEP/ATP III risk factors include female age 23 years old or older, diabetes, current tobacco user, hypertension, and peripheral vascular disease.  Negative NCEP/ATP III risk factors include no history of early menopause without estrogen hormone replacement, HDL cholesterol greater than 60, no ASHD (atherosclerotic heart disease), no prior stroke/TIA, and no history of aortic aneurysm.       Preventive Screening-Counseling & Management  Alcohol-Tobacco     Alcohol drinks/day: 0     Smoking Status:  current     Smoking Cessation Counseling: yes     Packs/Day: 1/2     Year Started: Lithium     Does Patient Exercise: no  Colonoscopy  Procedure date:  01/10/2009  Findings:      -  Two hyperplastic polyps - s/p cold snare polypectomy at 70 cm and 20 cm  REPORT OF SURGICAL PATHOLOGY 1. Colon, 70cm, polyp biopsy: Tubular adenoma (one fragment). No high grade dysplasia or malignancy identified.  2. Colon, 20cm, polyp, biopsy: Hyperplastic polyps, polypoid fragments of benign colonic mucosa, no adenomatous changes, dysplasia, or malignancy identified.    Location:  [place].    Colonoscopy  Procedure date:  01/10/2009  Findings:      - Two hyperplastic polyps - s/p cold snare polypectomy at 70 cm and 20 cm REPORT OF SURGICAL PATHOLOGY 1. Colon, 70cm, polyp biopsy: Tubular adenoma (one fragment). No high grade dysplasia or malignancy  identified.  2. Colon, 20cm, polyp, biopsy: Hyperplastic polyps, polypoid fragments of benign colonic mucosa, no adenomatous changes, dysplasia, or malignancy identified.    Location:  [place].    Comments:      Repeat colonoscopy in 5 years.   Current Medications (verified): 1)  Glipizide Xl 10 Mg Tb24 (Glipizide) .... Take 1 Tablet By Mouth Once A Day 2)  Ultracet 37.5-325 Mg Tabs (Tramadol-Acetaminophen) .... Take One Tablet Every 6 Hours As Needed For Pain. 3)  Lisinopril 20 Mg Tabs (Lisinopril) .... Take One Tablet Daily For Blood Pressure. 4)  Pravachol 40 Mg Tabs (Pravastatin Sodium) .... Take One Tablet Daily To Lower Cholesterol.  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 Marylene Buerger. 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 Marylene Buerger 3) H/o PVD S/p Lt Femoral-popliteal bypass Sx 5/04  Review of Systems       Per HPI  Diabetes Management Exam:    Foot Exam (with socks and/or shoes not present):       Sensory-Monofilament:          Left foot: abnormal          Right foot: abnormal   Impression & Recommendations:  Problem # 1:  DIABETES MELLITUS, TYPE II (ICD-250.00) Patient's HgA1c is now 5.8% and postprandial CBG is 105mg /dL. Pt does not report hypoglycemic symptoms, but since glucometer is broken, unable to accurately assess. Previously tried on Metformin 2009 - with reported nausea, dizziness, diarrhea - Will decrease Glipizide dosage to 5mg  daily -  prescription given - Will refer to Barnabas Harries for help with obtaining glucometer --> recommended checking BG once daily preprandial to breakfast - with anticipated glucometer review next visit. - Will refer to podiatry for help with management of calluses, left 5th digit small lesion, shoe recommendation.  The following medications were removed from the medication list:    Glipizide Xl 10 Mg Tb24 (Glipizide) .Marland Kitchen... Take 1 tablet by mouth once a day Her updated medication list for this problem includes:    Lisinopril 20 Mg Tabs (Lisinopril) .Marland Kitchen... Take one tablet  daily for blood pressure.    Glipizide Xl 5 Mg Xr24h-tab (Glipizide) .Marland Kitchen... Take 1 tablet by mouth once a day  Orders: T- Capillary Blood Glucose [id number]) T-Hgb A1C (in-house) [id number]) T-Urine Microalbumin w/creat. ratio [id number]) Diabetic Clinic Referral (Diabetic) Podiatry Referral (Podiatry)  Problem # 2:  HYPERTENSION (ICD-401.9) Not well controlled currently. Pt has discontinued HCTZ recently, although pt unable to specify why she stopped it (nothing noted in clinic notes). In setting of pending surgery in very near future, will defer adding another medication at this time. - Will counsel on nutritional management in hypertension  - Continue Lisinopril 20mg  - refill given - Next visit - if BP's continue to trend up, will consider restarting HCTZ versus increasing Lisinopril dosage.   The following medications were removed from the medication list:    Hydrochlorothiazide 25 Mg Tabs (Hydrochlorothiazide) .Marland Kitchen... Take one tablet daily for blood pressure. Her updated medication list for this problem includes:    Lisinopril 20 Mg Tabs (Lisinopril) .Marland Kitchen... Take one tablet daily for blood pressure.  Problem # 3:  HYPERLIPIDEMIA (B2193296.4) Last Lipid panel 04/11 showed Tcholesteol 237, LDL 145. Patient on Pravachol 40mg  currently. LFTs 12/2009 wnl. - Will provide dietary counseling - Consider recheck of LFTs next visit  Her  updated medication list for this problem includes:    Pravachol 40 Mg Tabs (Pravastatin sodium) .Marland Kitchen... Take one tablet daily to lower cholesterol.  Problem # 4:  Preventive Health Care (ICD-V70.0) Last colonoscopy: 12/2008 - 2 polyps (1 adenomatous, 1 hyperplastic) --> next due 12/2013 Last FLP: 12/2009 --> next due 12/2010 Last HgA1c:  12/2009 - 6.3  -->  order today Last Microalbumin/Creatinine ratio: 01/2009 - 93.3 -->  order today Last CMP:  12/2009 - wnl --> next due 12/2010 Last PAP: 09/2004 - Positive for T. vaginalis -->  hx of some "cancer" for which hysterectomy performed --> discuss further next visit  Last mammogram: 06/2009 - wnl -->  schedule today for mammogram next month Last foot exam: 12/2009 - high risk foot with multiple calluses -->   complete today Last eye exam: unknown -->   request last report today  Problem # 5:  TOBACCO ABUSE (ICD-305.1) counseled about importance of quitting, pt states cutting down but not ready to quit. Refused help with smoking cessation at this time. - Will revisit at a later visit  Problem # 6:  LOOSE STOOLS (ICD-787.91) Not suggestive of infectious etiology as pt is without abd pain, nausea, vomiting, fevers, malodor or blood in stools - will monitor   Problem # 7:  UNSPECIFIED PRE-OPERATIVE EXAMINATION (ICD-V72.84) Pt with anticipated cataract surgery, which is a low-risk surgery. Given patient's presenting symptoms and physical exam findings, she is determined to be medically stable for cataract surgery.  Complete Medication List: 1)  Ultracet 37.5-325 Mg Tabs (Tramadol-acetaminophen) .... Take one tablet every 6 hours as needed for pain. 2)  Lisinopril 20 Mg Tabs (Lisinopril) .... Take one tablet daily for blood pressure. 3)  Pravachol 40 Mg Tabs (Pravastatin sodium) .... Take one tablet daily to lower cholesterol. 4)  Glipizide Xl 5 Mg Xr24h-tab (Glipizide) .... Take 1 tablet by mouth once a day  Lipid Assessment/Plan:      Based  on NCEP/ATP III, the patient's risk factor category is "history of coronary disease, peripheral vascular disease, cerebrovascular disease, or aortic aneurysm along with either diabetes, current smoker, or LDL > 130 plus HDL < 40 plus triglycerides > 200".  The patient's lipid goals are as follows: Total cholesterol goal  is 200; LDL cholesterol goal is 70; HDL cholesterol goal is 40; Triglyceride goal is 150.  Her LDL cholesterol goal has not been met.  Secondary causes for hyperlipidemia have been ruled out.  She has been counseled on adjunctive measures for lowering her cholesterol and has been provided with dietary instructions.    Patient Instructions: 1)  Please follow-up at the clinic in 1-2 months, at which time we will reevaluate your diabetes and blood pressure after you have your eye surgery. 2)  Please schedule visit with Barnabas Harries for help with getting glucometer. 3)  Your Glipizide dosage has been decreased to 5mg  daily. Please STOP Glipizide XL 10mg  daily. 4)  You are medically stable for your eye surgery - I will send a note to your doctor. 5)  We are checking your protein in your urine today, I will call you if results are abnormal. 6)  You will be referred to Podiatry for help with your foot calluses and toenail management. 7)  Please check your sugars regularly once daily, before breakfast. 8)  Please bring all of your medications in a bag and your glucometer to your next visit. 9)  Please keep your appointment for mammogram in 06/2010. Prescriptions: PRAVACHOL 40 MG TABS (PRAVASTATIN SODIUM) Take one tablet daily to lower cholesterol.  #30 Tablet x 5   Entered and Authorized by:   Chilton Greathouse DO   Signed by:   Chilton Greathouse DO on 06/18/2010   Method used:   Electronically to        [id number] (retail)       [address] [place], [place]  [address]       Ph: [phone]       Fax: [phone]   RxID:    [id number] GLIPIZIDE XL 5 MG XR24H-TAB (GLIPIZIDE) Take 1 tablet by mouth once a day  #30 x 5   Entered and Authorized by:   Chilton Greathouse DO   Signed by:   Chilton Greathouse DO on 06/18/2010   Method used:   Electronically to        [id number] (retail)       [address]       [place], [place]  [address]       Ph: [phone]       Fax: [phone]   RxID:   [id number]  Process Orders Check Orders Results:     [facility]: Check successful Tests Sent for requisitioning (June 20, 2010 2:54 PM):     06/18/2010: [facility] -- T-Urine Microalbumin w/creat. ratio [[id number]] (signed)     Prevention & Chronic Care Immunizations   Influenza vaccine: Not documented   Influenza vaccine deferral: Refused  (06/18/2010)    Tetanus booster: Not documented   Td booster deferral: Deferred  (06/18/2010)    Pneumococcal vaccine: Not documented   Pneumococcal vaccine deferral: Refused  (06/18/2010)    H. zoster vaccine: Not documented   H. zoster vaccine deferral: Refused  (06/18/2010)    Immunization comments: Patient unsure of last tetanus booster. Will check and report back at next visit.  Colorectal Screening   Hemoccult: Not documented    Colonoscopy: - Two polyps - s/p cold snare polypectomy at 70 cm and 20 cm REPORT OF SURGICAL PATHOLOGY 1. Colon, 70cm, polyp biopsy: Tubular adenoma (  one fragment). No high grade dysplasia or malignancy identified.  2. Colon, 20cm, polyp, biopsy: Hyperplastic polyps, polypoid fragments of benign colonic mucosa, no adenomatous changes, dysplasia, or malignancy identified.    Location:  [place].  (01/10/2009)   Colonoscopy action/deferral: Repeat colonoscopy in 5 years.   (01/10/2009)   Colonoscopy due: 01/10/2014  Other Screening   Pap smear:  Specimen Adequacy: Satisfactory for evaluation.   Specimen Adequacy:  Unsatisfactory for evaluation.  Interpretation/Result:Negative for intraepithelial Lesion or Malignancy.   Interpretation/Result:Trichomonas Vaginalis present.      (10/22/2004)   Pap smear action/deferral: Not indicated-other  (01/16/2010)   Pap smear due: 10/2006    Mammogram: BI-RADS CATEGORY 1:  Negative.^MM DIGITAL DIAGNOSTIC BILAT  (07/12/2009)    DXA bone density scan: Not documented  Reports requested:  Smoking status: current  (06/18/2010)   Smoking cessation counseling: yes  (06/18/2010)  Diabetes Mellitus   HgbA1C: 5.9  (06/18/2010)    Eye exam: Not documented   Last eye exam report requested.   Diabetic eye exam action/deferral: Deferred  (06/18/2010)    Foot exam: yes  (06/18/2010)   Foot exam action/deferral: Do today   High risk foot: Yes  (06/18/2010)   Foot care education: Done  (06/18/2010)   Foot exam due: 12/14/2008    Urine microalbumin/creatinine ratio: 93.9  (02/20/2009)   Urine microalbumin action/deferral: Ordered    Diabetes flowsheet reviewed?: Yes   Progress toward A1C goal: Improved  Lipids   Total Cholesterol: 237  (01/16/2010)   Lipid panel action/deferral: Lipid Panel ordered   LDL: 145  (01/16/2010)   LDL Direct: Not documented   HDL: 71  (01/16/2010)   Triglycerides: 103  (01/16/2010)    SGOT (AST): 20  (01/16/2010)   BMP action: Ordered   SGPT (ALT): 17  (01/16/2010)   Alkaline phosphatase: 73  (01/16/2010)   Total bilirubin: 0.5  (01/16/2010)    Lipid flowsheet reviewed?: Yes   Progress toward LDL goal: Deteriorated  Hypertension   Last Blood Pressure: 150 / 78  (06/18/2010)   Serum creatinine: 0.78  (01/16/2010)   BMP action: Ordered   Serum potassium 4.1  (01/16/2010)    Hypertension flowsheet reviewed?: Yes   Progress toward BP goal: Unchanged  Self-Management Support :   Personal Goals (by the next clinic visit) :     Personal A1C goal: 7  (01/16/2010)     Personal blood pressure goal: 130/80  (01/16/2010)      Personal LDL goal: 100  (01/16/2010)    Patient will work on the following items until the next clinic visit to reach self-care goals:     Medications and monitoring: take my medicines every day, examine my feet every day  (06/18/2010)     Eating: drink diet soda or water instead of juice or soda, eat more vegetables, use fresh or frozen vegetables, eat foods that are low in salt, eat baked foods instead of fried foods, eat fruit for snacks and desserts, limit or avoid alcohol  (06/18/2010)     Activity: take a 30 minute walk every day  (01/16/2010)    Diabetes self-management support: Resources for patients handout  (06/18/2010)   Last diabetes self-management training by diabetes educator: 09/21/2008   Referred for diabetes self-mgmt training.    Hypertension self-management support: Resources for patients handout  (06/18/2010)    Lipid self-management support: Resources for patients handout  (06/18/2010)        Resource handout printed.   Nursing Instructions: Request report of  last diabetic eye exam Diabetic foot exam today   Laboratory Results   Blood Tests   Date/Time Received: June 18, 2010 3:28 PM Date/Time Reported: Maryan Rued  June 18, 2010 3:28 PM   HGBA1C: 5.9%   (Normal Range: Non-Diabetic - 3-6%   Control Diabetic - 6-8%) CBG Random:: 105mg /dL

## 2010-10-30 NOTE — Assessment & Plan Note (Signed)
Summary: FU ON TEST/EST/VS   Vital Signs:  Patient Profile:   77 Years Old Female Height:     66.75 inches (169.55 cm) Weight:      161.2 pounds (73.27 kg) BMI:     25.53 Temp:     97.2 degrees F (36.22 degrees C) oral Pulse rate:   71 / minute BP sitting:   126 / 66  (right arm)  Pt. in pain?   no  Vitals Entered By: Silverio Decamp NT II (May 23, 2008 2:01 PM)              Is Patient Diabetic? Yes  Nutritional Status BMI of 25 - 29 = overweight  Does patient need assistance? Functional Status Self care Ambulation Normal     PCP:  Dionicio Stall, MD  Chief Complaint:  TEST  RESULTS.  History of Present Illness: This is a  year old woman with past medical history of   S/p Rt partial Mastectomy for intraductal Ca of the Rt breast (5/05) followed by Dr. Marylene Buerger. H/o PVD S/p Lt Femoral-popliteal bypass Sx 5/04 Diabetes mellitus, type II Hypertension H/o Hyperlipidemia: not on any medications S/p Hystrectomy for ?cancer/mass Latent Tb- treated with isoniazid starting in 11/2007   She is here today for re-evaluation of edema and HTN she devoped a month ago.  At that time I advised her to stop taking ibuprofin.  She also had an ECHO which was normal, and a UA which showed significant spillage of albumin.  She is here with no complaints.  She feels very well.    Prior Medications Reviewed Using: Medication Bottles  Updated Prior Medication List: GLIPIZIDE XL 10 MG TB24 (GLIPIZIDE) Take 1 tablet by mouth once a day LISINOPRIL 20 MG  TABS (LISINOPRIL) Take one tablet daily for blood pressure. ULTRACET  TABS (TRAMADOL-ACETAMINOPHEN TABS) take one tablet by mouth every 4hrs. as needed [facility] VITAMIN B-6 50 MG  TABS (PYRIDOXINE HCL) Take one tablet daily while you are taking isoniazid. HYDROCHLOROTHIAZIDE 25 MG  TABS (HYDROCHLOROTHIAZIDE) Take one tablet daily for blood pressure.  Current Allergies: No known allergies   Past Medical History:     S/p Rt partial Mastectomy for intraductal Ca of the Rt breast (5/05) followed by Dr. Marylene Buerger.    H/o PVD S/p Lt Femoral-popliteal bypass Sx 5/04    Diabetes mellitus, type II    Hypertension    H/o Hyperlipidemia: not on any medications    S/p Hystrectomy for ?cancer/mass    Latent Tb- treated with isoniazid starting in 11/2007    ECHO 05/04/08 EF 55-65%    Risk Factors: Tobacco use:  current    Year started:  1943    Cigarettes:  Yes -- 1/2 pack(s) per day Alcohol use:  no Exercise:  no Seatbelt use:  100 %  Mammogram History:    Date of Last Mammogram:  07/09/2007  PAP Smear History:    Date of Last PAP Smear:  10/22/2004   Review of Systems  The patient denies fever, weight gain, chest pain, syncope, dyspnea on exertion, and peripheral edema.     Physical Exam  General:     alert and well-developed.   Lungs:     normal respiratory effort and normal breath sounds.   Heart:     normal rate, regular rhythm, and no murmur.   Abdomen:     soft, non-tender, and normal bowel sounds.   Pulses:     +1 Extremities:     no  edema Neurologic:     alert & oriented X3 and cranial nerves II-XII intact.      Impression & Recommendations:  Problem # 1:  EDEMA (W8805310.3) Edema has completely resolved now that she has stopped ibuprofin.  ECHO on  05/04/08 is normal (EF 55-65%).  She feels great.  Her updated medication list for this problem includes:    Hydrochlorothiazide 25 Mg Tabs (Hydrochlorothiazide) .Marland Kitchen... Take one tablet daily for blood pressure.   Problem # 2:  HYPERTENSION (ICD-401.9) Blood pressure is great today.  Continue current regimen.  Her updated medication list for this problem includes:    Lisinopril 20 Mg Tabs (Lisinopril) .Marland Kitchen... Take one tablet daily for blood pressure.    Hydrochlorothiazide 25 Mg Tabs (Hydrochlorothiazide) .Marland Kitchen... Take one tablet daily for blood pressure.  BP today: 126/66 Prior BP: 202/75 (04/27/2008)  Labs Reviewed:  Creat: 0.70 (04/27/2008) Chol: 271 (10/28/2007)   HDL: 65 (10/28/2007)   LDL: 186 (10/28/2007)   TG: 101 (10/28/2007)   Problem # 3:  DIABETES MELLITUS, TYPE II (ICD-250.00) Last A1C is 7.5.  Will recheck in September.  May need to increase management if it is not under 7 at that time.   Her updated medication list for this problem includes:    Glipizide Xl 10 Mg Tb24 (Glipizide) .Marland Kitchen... Take 1 tablet by mouth once a day    Lisinopril 20 Mg Tabs (Lisinopril) .Marland Kitchen... Take one tablet daily for blood pressure.  Labs Reviewed: HgBA1c: 7.5 (03/28/2008)   Creat: 0.70 (04/27/2008)   Microalbumin: 74.20 (04/27/2008)   Problem # 4:  HYPERLIPIDEMIA (ICD-272.4) This was noted in 1/09 but she has seen multiple providers since then and has not been started on a statin.  I will start her at this time and she will need a CMET at her next appointment.  Labs Reviewed: Chol: 271 (10/28/2007)   HDL: 65 (10/28/2007)   LDL: 186 (10/28/2007)   TG: 101 (10/28/2007) SGOT: 23 (04/27/2008)   SGPT: 22 (04/27/2008)  Future Orders: T-Comprehensive Metabolic Panel ([id number]) ... 06/22/2008  Her updated medication list for this problem includes:    Lipitor 40 Mg Tabs (Atorvastatin calcium) .Marland Kitchen... Take one tablet a day to lower cholesterol.   Complete Medication List: 1)  Glipizide Xl 10 Mg Tb24 (Glipizide) .... Take 1 tablet by mouth once a day 2)  Lisinopril 20 Mg Tabs (Lisinopril) .... Take one tablet daily for blood pressure. 3)  Ultracet Tabs (Tramadol-acetaminophen tabs) .... Take one tablet by mouth every 4hrs. as needed 4)  [facility] Vitamin B-6 50 Mg Tabs (Pyridoxine hcl) .... Take one tablet daily while you are taking isoniazid. 5)  Hydrochlorothiazide 25 Mg Tabs (Hydrochlorothiazide) .... Take one tablet daily for blood pressure. 6)  Lipitor 40 Mg Tabs (Atorvastatin calcium) .... Take one tablet a day to lower cholesterol.   Patient Instructions: 1)  Please schedule a follow-up appointment in 1 month.  2)  Your blood pressure is great today. 3)  You have refills on lisinopril, HCTZ and glypizide at [facility].   Prescriptions: HYDROCHLOROTHIAZIDE 25 MG  TABS (HYDROCHLOROTHIAZIDE) Take one tablet daily for blood pressure.  #32 x 6   Entered and Authorized by:   Myrtis Ser MD   Signed by:   Myrtis Ser MD on 05/23/2008   Method used:   Electronically to        [facility] (retail)       [address]  [place], [place]  [address]       Ph: [phone]       Fax: [phone]   [id number] GLIPIZIDE XL 10 MG TB24 (GLIPIZIDE) Take 1 tablet by mouth once a day  #31 x 5   Entered and Authorized by:   Myrtis Ser MD   Signed by:   Myrtis Ser MD on 05/23/2008   Method used:   Electronically to        [facility] (retail)       [place], [place]  [address]       Ph: [phone]       Fax: [phone]   RxIDTM:[id number] LISINOPRIL 20 MG  TABS (LISINOPRIL) Take one tablet daily for blood pressure.  #32 x 6   Entered and Authorized by:   Myrtis Ser MD   Signed by:   Myrtis Ser MD on 05/23/2008   Method used:   Electronically to        [facility] (retail)       [address] [place], [place]  [address]       Ph: [phone]       Fax: [phone]   [id number]  ]

## 2010-10-30 NOTE — Progress Notes (Signed)
Summary: med refill/gp  Phone Note Refill Request Message from:  Fax from Pharmacy on March 05, 2010 1:42 PM  Refills Requested: Medication #1:  GLIPIZIDE XL 10 MG TB24 Take 1 tablet by mouth once a day   Last Refilled: 01/26/2010 Last appt./CMP 01/16/10.   Method Requested: Electronic Initial call taken by: Morrison Old RN,  March 05, 2010 1:49 PM    Prescriptions: GLIPIZIDE XL 10 MG TB24 (GLIPIZIDE) Take 1 tablet by mouth once a day  #31 x 11   Entered and Authorized by:   Myrtis Ser MD   Signed by:   Myrtis Ser MD on 03/06/2010   Method used:   Electronically to        [facility] (retail)       [address] [place], [place]  [address]       Ph: [phone]       Fax: [phone]   RxID:   [id number]

## 2010-10-30 NOTE — Assessment & Plan Note (Signed)
Summary: need later time [mkj]   Vital Signs:  Patient Profile:   77 Years Old Female Height:     66.75 inches (169.55 cm) Weight:      165 pounds (75 kg) BMI:     26.13 Temp:     98.1 degrees F (36.72 degrees C) oral Pulse rate:   80 / minute BP sitting:   153 / 72  (right arm)  Pt. in pain?   no  Vitals Entered By: Morrison Old RN (October 28, 2007 3:53 PM)              Is Patient Diabetic? Yes  Nutritional Status BMI of 25 - 29 = overweight CBG Result 147  Have you ever been in a relationship where you felt threatened, hurt or afraid?No   Does patient need assistance? Functional Status Self care Ambulation Normal     PCP:  Dionicio Stall, MD  Chief Complaint:  check-up.  History of Present Illness: This is a 77 year old woman with past medical history of   S/p Rt partial Mastectomy for intraductal Ca of the Rt breast (5/05) H/o PVD S/p Lt Femoral-popliteal bypass Sx 5/04 Diabetes mellitus, type II Hypertension H/o Hyperlipidemia: not on any medications S/p Hystrectomy for ?cancer/mass  She is here today for routine check up.  She has the following complaints:  A questionable ppd, which was placed 3 days ago.  Has developed a red whelt over the site, she reports this always happens when she gets a ppd, and it is always called normal.  It is 15x35mm, bright red, and very slightly indurated.  She wants to know if this is normal.  She reports no known exposures to TB. 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  She has no cough, chest pain, fevers, hemoptysis, weight loss, myalgias or other symptoms of active TB.   She occasionally gets painful twinge in LLQ of abd.  Is not happening presently and does not happen often. She feels it occurs more often when she is constipated.  She has never seen blood in her stool.  Pain is relieved by BM.  We discussed that this could be gas or a small diverticulum and she should avoid constipation.  She has occasional very short episodes of catching her breath.  Not like a hiccup, or like getting hit in the chest.  No chest pain, no SOA, diaphoresis, n/v, syncope, palpatations.  Does not happen often.    Current Allergies: No known allergies     Risk Factors:  Tobacco use:  current    Year started:  1943    Cigarettes:  Yes -- 1/2 pack(s) per day Alcohol use:  no Exercise:  no Seatbelt use:  100 %  PAP Smear History:    Date of Last PAP Smear:  10/22/2004   Review of Systems       The patient complains of abdominal pain.  The patient denies anorexia, fever, weight loss, chest pain, syncope, dyspnea on exhertion, peripheral edema, prolonged cough, hemoptysis, melena, hematochezia, severe indigestion/heartburn, and enlarged lymph nodes.     Physical Exam  General:     alert and healthy-appearing.   Head:     normocephalic and atraumatic.   Eyes:     vision grossly intact, pupils equal, pupils round, and pupils reactive to light, no injection. Ears:     R ear normal and L ear normal.   Nose:  no external deformity.   Mouth:     pharynx pink and moist.   Neck:     no masses.   Chest Wall:     no deformities.   Lungs:     normal respiratory effort and L decreased breath sounds.   Heart:     normal rate, regular rhythm, and no murmur.   Abdomen:     soft, non-tender, normal bowel sounds, and no masses.   Pulses:     2+ Extremities:     no edema Neurologic:     alert & oriented X3, cranial nerves II-XII intact, strength normal in all extremities, and sensation intact to light touch.   Skin:      turgor normal, color normal, and no suspicious lesions.   Cervical Nodes:     no anterior cervical adenopathy and no posterior cervical adenopathy.   Psych:     Oriented X3, memory intact for recent and remote, normally interactive, good eye contact, and not anxious appearing.      Impression & Recommendations:  Problem # 1:  POSITIVE PPD (ICD-795.5) The area is very red, and slightly indurated, this was difficult to interpret.  Consulted Dr. Wendie Simmer (infectious disease specialist) and he has called it positive.  Size is 25x75mm.  She has had this type of result for many years, indicating a distant exposure, and latent infection.  Will get chest xray today, baseline CMET and start isoniazid 300mg  once daily for 6 mo.    Have written a letter for her employer stating her PPD was positive, she is being treated and should not be kept from working... also mentioned that future ppds will be positive.  Have reported the test to the HD.  She will go to the HD clinic to get her isonizid, because it is much cheaper.  Have provided the patient with information on latent TB infection as well as isonizid.  She will come back in 2 weeks to make sure everything is straightened out.  Orders: CXR- 2view (CXR) T-Comprehensive Metabolic Panel ([id number])   Problem # 2:  [place] (ICD-V70.0) She reports a mammogram and colonoscopy last year, both normal, but I do not see any documentation of these tests.  Last pap 1/06 was normal, she is sp hysterectomy so may not need any more.   She has never had lipids checked, so I will check them today as she is fasting and is going to get a CMET anyway.  Problem # 3:  DIABETES MELLITUS, TYPE II (ICD-250.00) A1C 7.1 today, good control.  No changes needed at this time.  Refilled her medications.   Her updated medication list for this problem includes:    Glipizide Xl 10 Mg Tb24 (Glipizide) .Marland Kitchen... Take 1 tablet by mouth once a day     Lisinopril 10 Mg Tabs (Lisinopril) .Marland Kitchen... Take 1 tablet by mouth once a day    Metformin Hcl 500 Mg Tabs (Metformin hcl) .Marland Kitchen... Take one tablet two times a day.  Orders: T- Capillary Blood Glucose [id number]) T-Hgb A1C (in-house) [id number]) T-Lipid Profile [phone])  Labs Reviewed: HgBA1c: 7.0 (10/28/2007)   Creat: 0.87 (04/08/2007)   Microalbumin: 5.2 (01/29/2006)   Complete Medication List: 1)  Glipizide Xl 10 Mg Tb24 (Glipizide) .... Take 1 tablet by mouth once a day 2)  Lisinopril 10 Mg Tabs (Lisinopril) .... Take 1 tablet by mouth once a day 3)  Ultracet Tabs (Tramadol-acetaminophen tabs) .... Take one tablet by mouth every 4hrs. as needed 4)  Metformin Hcl 500 Mg Tabs (Metformin hcl) .... Take one tablet two times a day. 5)  Isoniazid 300 Mg Tabs (Isoniazid) .... Take one tablet daily for 6 months.   Patient Instructions: 1)  Please schedule a follow-up appointment in 2 weeks to check up on your new medication. 2)  You had a positive ppd test today, and we are treating you to be sure that you never develop TB. 3)  You had lab work done today.  If there is anything that needs to be discussed before your next appointment I will call you.    Prescriptions: ISONIAZID 300 MG  TABS (ISONIAZID) Take one tablet daily for 6 months.  #32 x 6   Entered and Authorized by:   Myrtis Ser MD   Signed by:   Myrtis Ser MD on 10/28/2007   Method used:   Print then Give to Patient   RxID:   [id number] METFORMIN HCL 500 MG  TABS (METFORMIN HCL) Take one tablet two times a day.  #64 x 6   Entered and Authorized by:   Myrtis Ser MD   Signed by:   Myrtis Ser MD on 10/28/2007   Method used:   Electronically sent to ...       [id number]       [place], [place]  [address]       Ph: [phone]       Fax: [phone]   RxID:   [id number]  ISONIAZID 300 MG  TABS (ISONIAZID) Take one tablet daily for 6 months.  #32 x 6   Entered and Authorized by:   Myrtis Ser MD   Signed by:   Myrtis Ser MD on 10/28/2007   Method used:   Electronically sent to ...       [facility]       [address]       [place], [place]  [address]       Ph: [phone]       Fax: [phone]   RxID:   [id number]  ]   Impression & Recommendations:  Problem # 1:  POSITIVE PPD (ICD-795.5) The area is very red, and slightly indurated, this was difficult to interpret.  Consulted Dr. Wendie Simmer (infectious disease specialist) and he has called it positive.  Size is 25x25mm.  She has had this type of result for many years, indicating a distant exposure, and latent infection.  Will get chest xray today, baseline CMET and start isoniazid 300mg  once daily for 6 mo.    Have written a letter for her employer stating her PPD was positive, she is being treated and should not be kept from working... also mentioned that future ppds will be positive.  Have reported the test to the HD.  She will go to the HD clinic to get her isonizid, because it is much cheaper.  Have provided the patient with information on latent TB infection as well as isonizid.  She will come back in 2 weeks to make sure everything is straightened out.  Orders: CXR- 2view (CXR) T-Comprehensive Metabolic Panel ([id number])   Problem # 2:  [place] (ICD-V70.0) She reports a mammogram and colonoscopy last year, both normal, but I do not see any documentation of these tests.  Last pap 1/06 was normal, she is  sp hysterectomy so may not need any more.   She has never had lipids checked, so I will check them today as she is fasting and is going to get a CMET anyway.  Problem # 3:  DIABETES MELLITUS, TYPE II (ICD-250.00) A1C 7.1 today, good control.  No changes needed at this time.  Refilled her medications.    Her updated medication list for this problem includes:    Glipizide Xl 10 Mg Tb24 (Glipizide) .Marland Kitchen... Take 1 tablet by mouth once a day    Lisinopril 10 Mg Tabs (Lisinopril) .Marland Kitchen... Take 1 tablet by mouth once a day    Metformin Hcl 500 Mg Tabs (Metformin hcl) .Marland Kitchen... Take one tablet two times a day.  Orders: T- Capillary Blood Glucose [id number]) T-Hgb A1C (in-house) [id number]) T-Lipid Profile [id number])  Labs Reviewed: HgBA1c: 7.0 (10/28/2007)   Creat: 0.87 (04/08/2007)   Microalbumin: 5.2 (01/29/2006)   Complete Medication List: 1)  Glipizide Xl 10 Mg Tb24 (Glipizide) .... Take 1 tablet by mouth once a day 2)  Lisinopril 10 Mg Tabs (Lisinopril) .... Take 1 tablet by mouth once a day 3)  Ultracet Tabs (Tramadol-acetaminophen tabs) .... Take one tablet by mouth every 4hrs. as needed 4)  Metformin Hcl 500 Mg Tabs (Metformin hcl) .... Take one tablet two times a day. 5)  Isoniazid 300 Mg Tabs (Isoniazid) .... Take one tablet daily for 6 months.  Laboratory Results   Blood Tests   Date/Time Recieved: October 28, 2007 5:03 PM Date/Time Reported: ..................................................................Marland KitchenMelvia Heaps  October 28, 2007 5:03 PM   HGBA1C: 7.0%   (Normal Range: Non-Diabetic - 3-6%   Control Diabetic - 6-8%) CBG Random: 147

## 2010-10-30 NOTE — Progress Notes (Signed)
Summary: refill/ hla  Phone Note Refill Request Message from:  Fax from Pharmacy on March 21, 2008 3:13 PM  Refills Requested: Medication #1:  ULTRACET  TABS take one tablet by mouth every 4hrs. as needed   Last Refilled: 01/11/2008 Initial call taken by: Freddy Finner RN,  March 21, 2008 3:13 PM  Follow-up for Phone Call        I went ahead and refilled this script.  I thought it had not been refilled for a year, but actually it had been refilled last 4/09.  She has chronic hip pain.      Prescriptions: ULTRACET  TABS (TRAMADOL-ACETAMINOPHEN TABS) take one tablet by mouth every 4hrs. as needed  #120 x 3   Entered and Authorized by:   Myrtis Ser MD   Signed by:   Myrtis Ser MD on 03/23/2008   Method used:   Electronically sent to ...       [facility]       [address]       [place], [place]  [address]       Ph: [phone]       Fax: [phone]   RxID:   [id number]

## 2010-10-30 NOTE — Miscellaneous (Signed)
Summary: high risk foot exam, podiatry referal.  Clinical Lists Changes  Orders: Added new Referral order of Podiatry Referral (Podiatry) - Signed Observations: Added new observation of DB FT EX DUE: 12/14/2008 (09/08/2008 16:03) Added new observation of PEDAL PULS L: 1+ (09/08/2008 16:03) Added new observation of P TIB PULS L: 1+ (09/08/2008 16:03) Added new observation of PEDAL PULS R: 1+ (09/08/2008 16:03) Added new observation of P TIB PULS R: 1+ (09/08/2008 16:03)     Last LDL:                                                 186 (10/28/2007 10:04:00 PM)        Diabetic Foot Exam Pulse Check          Right Foot          Left Foot Posterior Tibial:        1+            1+ Dorsalis Pedis:        1+            1+ Comments: hammer toe on both 2nd toes, lesions from wart remover acid appliction on right foot. Set Next Diabetic Foot Exam here: 12/14/2008

## 2010-10-30 NOTE — Assessment & Plan Note (Signed)
Summary: DIABETES TEACHING/CFB              CBG Device ID not sure of name- got it from [facility]       Current Allergies: No known allergies           ]  Diabetes Self Management Training  PCP:  Myrtis Ser MD Diabetes Type: Type 2 non-insulin treated Current smoking Status: current     Assessment Type of Work: caretakes Daily activities: likes to stay busy  Potential Barriers  Economic/Supplies  Diabetes Medications:  Comments: always takes diabetes medicine. has been dizzy when on two blood pressure medicines, but is better now.    Monitoring Self monitoring blood glucose Never Name of Meter  not sure of name- got it from [facility] for Low Blood sugar Yes    Other Assessment Had an amputation due to diabetes? No Performs daily self-foot exams: Yes    Last Physician foot exam:  09/08/2008   Activity Limitations  Appropriate physical activity  Medications State name-action-dose-duration-side effects-and time to take medication: Needs review/assistance State appropriate timing of food related to medication: Not applicable  Describe safe needle/lancet disposal: Needs review/assistance  Nutritional Management Identify what foods most often affect blood glucose: Needs review/assistance Verbalize importance of controlling food portions: Demonstrates competency State importance of spacing and not omitting meals and snacks: Demonstrates competency  Monitoring State purpose and frequency of monitoring BG-ketones-HgbA1C and when to contact health care team with results: Needs review/assistance Perform glucose monitoring/ketone testing and record results correctly: Needs review/assistance State target blood glucose and HgbA1C goals: Needs review/assistance  Complications State the causes- signs and symptoms and prevention of hypoglycemia: Needs review/assistance Explain proper treatment of hypoglycemia: Needs review/assistance     BEHAVIORAL GOALS INITIAL Preventing,detecting and treating complications: working on quitting smoking- down to 1/2 pack per day- discuss plans for further decrease at follow-up        Diabetes Self Management Support: clinic staff Time spent: 30 minutes Follow-up: 1 week with meter Last LDL:                                                 186 (10/28/2007 10:04:00 PM)

## 2010-10-30 NOTE — Assessment & Plan Note (Signed)
Summary: 26MONTH FU/EST/VS   Vital Signs:  Patient Profile:   77 Years Old Female Height:     66.75 inches (169.55 cm) Weight:      174.2 pounds (79.18 kg) BMI:     27.59 Temp:     98.2 degrees F (36.78 degrees C) oral Pulse rate:   66 / minute BP sitting:   202 / 75  (right arm)  Pt. in pain?   no  Vitals Entered By: Silverio Decamp NT II (April 27, 2008 1:38 PM)              Is Patient Diabetic? No Nutritional Status BMI of 25 - 29 = overweight  Have you ever been in a relationship where you felt threatened, hurt or afraid?No   Does patient need assistance? Functional Status Self care Ambulation Normal     PCP:  Dionicio Stall, MD  Chief Complaint:  BILATERAL LEG SWELLNG.  History of Present Illness: This is a 77 year old woman with past medical history of   S/p Rt partial Mastectomy for intraductal Ca of the Rt breast (5/05) H/o PVD S/p Lt Femoral-popliteal bypass Sx 5/04 Diabetes mellitus, type II Hypertension H/o Hyperlipidemia: not on any medications S/p Hystrectomy for ?cancer/mass Latent Tb- treated with isoniazid starting in 11/2007  She is here today for a check up.  She reports 3 weeks of swelling of legs, waist and face. No chest pain, cough, no change in exercise tolerance, no orthopnia.  She does feel very tired. No headache, fever, n/v/d, no recent illness.  She was changed from tramadol to ibuprofin last month, reports taking ibuprofin only occasionally not everyday.        Current Allergies: No known allergies   Past Medical History:    S/p Rt partial Mastectomy for intraductal Ca of the Rt breast (5/05) followed by Dr. Marylene Buerger.    H/o PVD S/p Lt Femoral-popliteal bypass Sx 5/04    Diabetes mellitus, type II    Hypertension    H/o Hyperlipidemia: not on any medications    S/p Hystrectomy for ?cancer/mass    Latent Tb- treated with isoniazid starting in 11/2007    Risk Factors: Tobacco use:  current    Year started:  1943     Cigarettes:  Yes -- 1/2 pack(s) per day Alcohol use:  no Exercise:  no Seatbelt use:  100 %  PAP Smear History:    Date of Last PAP Smear:  10/22/2004  Mammogram History:     Date of Last Mammogram:  07/09/2007    Results:  Normal Left, with R scaring.    Review of Systems  General      Complains of fatigue.      Denies chills, fever, loss of appetite, malaise, sweats, and weakness.  CV      Complains of leg cramps with exertion, palpitations, swelling of feet, swelling of hands, and weight gain.      Denies chest pain or discomfort, difficulty breathing at night, difficulty breathing while lying down, and shortness of breath with exertion.  Resp      Denies chest discomfort, chest pain with inspiration, cough, sputum productive, and wheezing.  GI      Denies abdominal pain, bloody stools, and change in bowel habits.   Physical Exam  General:     alert.   Mouth:     good dentition and pharynx pink and moist.   Neck:     no JVD Lungs:  normal respiratory effort and normal breath sounds.  very slight crackles at B bases. Heart:     normal rate, regular rhythm, and no murmur.   Abdomen:     soft and non-tender.   Pulses:     +1 Extremities:     3+ edema to knees bilaterally Neurologic:     alert & oriented X3 and cranial nerves II-XII intact.   Skin:     turgor normal, no rashes, and no ecchymoses.   Cervical Nodes:     no anterior cervical adenopathy and no posterior cervical adenopathy.   Psych:     Oriented X3, memory intact for recent and remote, and normally interactive.      Impression & Recommendations:  Problem # 1:  EDEMA ([id number])  New onset 3+ pitting edema of both legs.  She reports increase in girth, and change in her face.  Her BP today is very high.  No chest pain or shortness of breath. Concerns are for renal failure now that she is taking ibuprofin, heart failure (though exam and history do not support.  CMET is normal.  UA shows >300 protein.  Will get urine abumin to creatinine to better quantify urine protien.  Will schedule an ECHO to be sure there is no heart failure causing this edema.  I think this has been caused by ibuprofin which can cause edema and hypertension.  She will stop taking ibuprofin, get an ECHO, increase her BP meds and return in two weeks to discuss.   The following medications were removed from the medication list:    Hydrochlorothiazide 12.5 Mg Tabs (Hydrochlorothiazide) ..... One tab daily  Her updated medication list for this problem includes:    Hydrochlorothiazide 25 Mg Tabs (Hydrochlorothiazide) .Marland Kitchen... Take one tablet daily for blood pressure.  Orders: T-Comprehensive Metabolic Panel ([id number]) T-Urinalysis Dipstick only ([id number]) T-CBC No Diff [id number]) 2 D Echo (2 D Echo) T-Urine Microalbumin w/creat. ratio [id number] / [id number]) T-Urinalysis [id number])   Problem # 2:  HYPERTENSION (ICD-401.9) BP very high today.  No headache, confusion or neurological symptoms.  Looking back has been high for a while.  Will incease HCTZ to 25 and lisinopril to 20.  She will need to come back in two weeks to check her edema and her BP.  Will get another BMET at that time.   The following medications were removed from the medication list:    Hydrochlorothiazide 12.5 Mg Tabs (Hydrochlorothiazide) ..... One tab daily  Her updated medication list for this problem includes:    Lisinopril 20 Mg Tabs (Lisinopril) .Marland Kitchen... Take one tablet daily for blood pressure.    Hydrochlorothiazide 25 Mg Tabs (Hydrochlorothiazide) .Marland Kitchen... Take one tablet daily for blood pressure.  BP today: 202/75  Prior BP: 173/67 (03/28/2008)  Labs Reviewed: Creat: 0.61 (03/28/2008) Chol: 271 (10/28/2007)   HDL: 65 (10/28/2007)   LDL: 186 (10/28/2007)   TG: 101 (10/28/2007)  Orders: 2 D Echo (2 D Echo)   Problem # 3:  POSITIVE PPD (ICD-795.5) She has been on isoniazid for 6 months now (Feb through July) and has comleted her course.  Will notify the HD.  Her CT in 4/09 showed a single enlarged LN concering because of both the +ppd and her HX of breast ca.  Time for repeat chest CT, scheduled for August 19th.  Problem # 4:  PREVENTIVE HEALTH CARE (ICD-V70.0) see that she has hx of breast cancer in 2005.  last mam oct 2008 was normal.  she is followed by Dr. Collier Salina  Young. no record of colonoscopy, need to schedule this or do stool cards.  Complete Medication List: 1)  Glipizide Xl 10 Mg Tb24 (Glipizide) .... Take 1 tablet by mouth once a day 2)  Lisinopril 20 Mg Tabs (Lisinopril) .... Take one tablet daily for blood pressure. 3)  Ultracet Tabs (Tramadol-acetaminophen tabs) .... Take one tablet by mouth every 4hrs. as needed 4)  [facility] Vitamin B-6 50 Mg Tabs (Pyridoxine hcl) .... Take one tablet daily while you are taking isoniazid. 5)  Hydrochlorothiazide 25 Mg Tabs (Hydrochlorothiazide) .... Take one tablet daily for blood pressure.  Other Orders: CT without Contrast (CT w/o contrast)   Patient Instructions: 1)  Follow up appointment in two weeks. 2)  Do not take any ibuprofin until your next appointment. 3)  You can stop taking isoniazid. 4)  You have a new prescription for HCTZ 25mg  per day. 5)  You have a new prescription for lisinopril 20mg  per day. 6)  You will be scheduled for an ultrasound of your heart soon. 7)  If you become short of breath or have chest pain call the clinic immediately.   ]

## 2010-10-30 NOTE — Progress Notes (Signed)
Summary: Refill/gh  Phone Note Refill Request Message from:  [place] on September 15, 2007 9:40 AM  Refills Requested: Medication #1:  LISINOPRIL 10 MG  TABS Take 1 tablet by mouth once a day   Last Refilled: 08/13/2007  Method Requested: Electronic Initial call taken by: Sander Nephew RN,  September 15, 2007 9:40 AM  Follow-up for Phone Call        Refill approved-nurse to complete Follow-up by: Luane School MD,  September 15, 2007 9:50 AM  Additional Follow-up for Phone Call Additional follow up Details #1::        Rx faxed to pharmacy Additional Follow-up by: Sander Nephew RN,  September 15, 2007 11:05 AM      Prescriptions: LISINOPRIL 10 MG  TABS (LISINOPRIL) Take 1 tablet by mouth once a day  #31 x 6   Entered and Authorized by:   Luane School MD   Signed by:   Luane School MD on 09/15/2007   Method used:   Electronically sent to ...       [id number]       [address]       [place], [place]  [address]       Ph: [phone]       Fax: [phone]   RxID:   [id number]

## 2010-10-30 NOTE — Assessment & Plan Note (Signed)
Summary: ACUTE-REVIEW OF TEST RESULTS/CFB   Vital Signs:  Patient Profile:   77 Years Old Female Height:     66.75 inches (169.55 cm) Weight:      169.04 pounds (76.84 kg) BMI:     26.77 Temp:     97.4 degrees F (36.33 degrees C) oral Pulse rate:   76 / minute BP sitting:   188 / 72  (right arm)  Pt. in pain?   yes    Location:   hips    Intensity:   6    Type:       aching  Vitals Entered By: Sander Nephew RN (December 29, 2007 9:38 AM)              Is Patient Diabetic? Yes  Nutritional Status BMI of 25 - 29 = overweight CBG Result 204  Have you ever been in a relationship where you felt threatened, hurt or afraid?No   Does patient need assistance? Functional Status Self care Ambulation Normal     PCP:  Dionicio Stall, MD  Chief Complaint:  Check up for pain.  History of Present Illness: This is a 77 year old woman with a past medical history of S/p Rt partial Mastectomy for intraductal Ca of the Rt breast (5/05) H/o PVD S/p Lt Femoral-popliteal bypass Sx 5/04 Diabetes mellitus, type II Hypertension H/o Hyperlipidemia: not on any medications S/p Hystrectomy for ?cancer/mass Latent Tb- treated with isoniazid starting in 11/2007   who is just coming today to be set up for her bone-scan.  Has no complaints.  Didn't know we could do this by phone.    Prior Medication List:  GLIPIZIDE XL 10 MG TB24 (GLIPIZIDE) Take 1 tablet by mouth once a day LISINOPRIL 10 MG  TABS (LISINOPRIL) Take 1 tablet by mouth once a day ULTRACET  TABS (TRAMADOL-ACETAMINOPHEN TABS) take one tablet by mouth every 4hrs. as needed ISONIAZID 300 MG  TABS (ISONIAZID) Take one tablet daily for 6 months. HYDROCHLOROTHIAZIDE 12.5 MG  TABS (HYDROCHLOROTHIAZIDE) one tab daily   Current Allergies: No known allergies     Risk Factors:  Tobacco use:  current    Year started:  1943    Cigarettes:  Yes -- 1/2 pack(s) per day    Counseled to quit/cut down tobacco use:  yes Alcohol use:  no  Exercise:  no Seatbelt use:  100 %  PAP Smear History:    Date of Last PAP Smear:  10/22/2004   Review of Systems  The patient denies fever, weight loss, chest pain, dyspnea on exhertion, prolonged cough, hemoptysis, and abdominal pain.     Physical Exam  General:     alert, well-developed, well-nourished, and well-hydrated.   Head:     normocephalic and atraumatic.   Lungs:     Normal respiratory effort, chest expands symmetrically. Lungs are clear to auscultation, no crackles or wheezes. Heart:     Normal rate and regular rhythm. S1 and S2 normal without gallop, murmur, click, rub or other extra sounds. Abdomen:     Bowel sounds positive,abdomen soft and non-tender without masses, organomegaly or hernias noted.    Impression & Recommendations:  Problem # 1:  PULMONARY NODULE (ICD-518.89) Agree with Dr Oralia Rud recommendation to send her for a CT-Scan of her lungs, given that she had a nodule found on X-ray, and she is a smoker with a history of breast cancer, and that we need to rule out a malignancy in this woman.   Will also set  her up for her bone-scan given the lesion that may or may not be a rib lesion seen on X-ray. CT without Contrast (CT w/o contrast)  Orders: CT without Contrast (CT w/o contrast)   Complete Medication List: 1)  Glipizide Xl 10 Mg Tb24 (Glipizide) .... Take 1 tablet by mouth once a day 2)  Lisinopril 10 Mg Tabs (Lisinopril) .... Take 1 tablet by mouth once a day 3)  Ultracet Tabs (Tramadol-acetaminophen tabs) .... Take one tablet by mouth every 4hrs. as needed 4)  Isoniazid 300 Mg Tabs (Isoniazid) .... Take one tablet daily for 6 months. 5)  Hydrochlorothiazide 12.5 Mg Tabs (Hydrochlorothiazide) .... One tab daily 6)  Promethazine Hcl 12.5 Mg Tabs (Promethazine hcl) .... Take one tablet by mouth for nausea.  may repeat after 4 hours if symptoms do not resolve.  Other Orders: Capillary Blood Glucose [id number]) Fingerstick [id number])    Patient Instructions: 1)  Follow-up appointment after your X-rays are all done for the results.    Prescriptions: PROMETHAZINE HCL 12.5 MG  TABS (PROMETHAZINE HCL) Take one tablet by mouth for nausea.  May repeat after 4 hours if symptoms do not resolve.  #2 x 0   Entered and Authorized by:   Mohammed Kindle MD   Signed by:   Mohammed Kindle MD on 12/29/2007   Method used:   Print then Give to Patient   [id number]  ]

## 2010-12-11 LAB — GLUCOSE, CAPILLARY: Glucose-Capillary: 120 mg/dL — ABNORMAL HIGH (ref 70–99)

## 2010-12-13 LAB — GLUCOSE, CAPILLARY: Glucose-Capillary: 105 mg/dL — ABNORMAL HIGH (ref 70–99)

## 2010-12-18 LAB — GLUCOSE, CAPILLARY: Glucose-Capillary: 105 mg/dL — ABNORMAL HIGH (ref 70–99)

## 2011-01-08 LAB — GLUCOSE, CAPILLARY: Glucose-Capillary: 89 mg/dL (ref 70–99)

## 2011-01-09 LAB — GLUCOSE, CAPILLARY
Glucose-Capillary: 100 mg/dL — ABNORMAL HIGH (ref 70–99)
Glucose-Capillary: 111 mg/dL — ABNORMAL HIGH (ref 70–99)

## 2011-01-12 ENCOUNTER — Encounter: Payer: Self-pay | Admitting: Internal Medicine

## 2011-01-14 ENCOUNTER — Encounter: Payer: Self-pay | Admitting: Internal Medicine

## 2011-01-15 LAB — GLUCOSE, CAPILLARY: Glucose-Capillary: 142 mg/dL — ABNORMAL HIGH (ref 70–99)

## 2011-01-17 ENCOUNTER — Ambulatory Visit (INDEPENDENT_AMBULATORY_CARE_PROVIDER_SITE_OTHER): Payer: Medicare Other | Admitting: Internal Medicine

## 2011-01-17 ENCOUNTER — Telehealth: Payer: Self-pay | Admitting: *Deleted

## 2011-01-17 DIAGNOSIS — M25559 Pain in unspecified hip: Secondary | ICD-10-CM

## 2011-01-17 DIAGNOSIS — I10 Essential (primary) hypertension: Secondary | ICD-10-CM

## 2011-01-17 DIAGNOSIS — M25551 Pain in right hip: Secondary | ICD-10-CM

## 2011-01-17 DIAGNOSIS — F17200 Nicotine dependence, unspecified, uncomplicated: Secondary | ICD-10-CM

## 2011-01-17 DIAGNOSIS — E119 Type 2 diabetes mellitus without complications: Secondary | ICD-10-CM

## 2011-01-17 DIAGNOSIS — E785 Hyperlipidemia, unspecified: Secondary | ICD-10-CM

## 2011-01-17 LAB — POCT GLYCOSYLATED HEMOGLOBIN (HGB A1C): Hemoglobin A1C: 6

## 2011-01-17 LAB — GLUCOSE, CAPILLARY: Glucose-Capillary: 119 mg/dL — ABNORMAL HIGH (ref 70–99)

## 2011-01-17 MED ORDER — TRAMADOL-ACETAMINOPHEN 37.5-325 MG PO TABS: 1.0000 | ORAL_TABLET | Freq: Four times a day (QID) | ORAL | Status: DC | PRN

## 2011-01-17 MED ORDER — METOPROLOL TARTRATE 12.5 MG HALF TABLET: 12.5000 mg | ORAL_TABLET | Freq: Two times a day (BID) | ORAL | Status: DC
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
## 2011-01-17 NOTE — Telephone Encounter (Signed)
Message copied by Lucky Rathke on Thu Jan 17, 2011 12:10 PM ------      Message from: Malachi Pro      Created: Sat Jan 12, 2011  3:52 PM       Hi Kathyann Spaugh, can you please request a copy of Ms. Sloniker last diabetic eye exam from Dr. Ricki Miller?            Thank you!            Sincerely,            Chilton Greathouse, D.O.

## 2011-01-17 NOTE — Patient Instructions (Signed)
   Please follow-up at the clinic in 1 month, at which time we will reevaluate your pain, blood pressure, diabetes.  If you have been started on new medication(s), and you develop throat closing, tongue swelling, rash, please stop the medication and call the clinic at [phone] and go to the ER.  If you are diabetic, please bring your meter to your next visit.  If symptoms worsen, or new symptoms arise, please call the clinic or go to the ER.  Please keep up your good work on trying to stop smoking!!   STOP glipizide.  Please bring all of your medications in a bag to your next visit.

## 2011-01-17 NOTE — Telephone Encounter (Signed)
CALLED DR BREWINTON'S OFFICE AND REQUESTED COPY OF MS Gina [name]'S MOST RECENT OFFICE NOTE AND EYE EXAM.[phone]) ASKED THAT THEY FAX THIS INFO TO FAX NUMBER [phone] . ATTENTION LELA.

## 2011-01-20 ENCOUNTER — Encounter: Payer: Self-pay | Admitting: Internal Medicine

## 2011-01-20 DIAGNOSIS — Z227 Latent tuberculosis: Secondary | ICD-10-CM | POA: Insufficient documentation

## 2011-01-20 DIAGNOSIS — I739 Peripheral vascular disease, unspecified: Secondary | ICD-10-CM | POA: Insufficient documentation

## 2011-01-20 DIAGNOSIS — E119 Type 2 diabetes mellitus without complications: Secondary | ICD-10-CM | POA: Insufficient documentation

## 2011-01-29 ENCOUNTER — Other Ambulatory Visit: Payer: Self-pay | Admitting: *Deleted

## 2011-01-29 ENCOUNTER — Encounter: Payer: Self-pay | Admitting: Internal Medicine

## 2011-01-29 NOTE — Telephone Encounter (Signed)
Last refill 3/12

## 2011-01-29 NOTE — Assessment & Plan Note (Signed)
Inadequately controlled on only lisinopril alone, and did not tolerate hctz. With history of htn, hld, dm a beta blocker may be beneficial. - Start Metoprolol - Recommend smoking cessation.

## 2011-01-29 NOTE — Assessment & Plan Note (Signed)
Pt not fasting today, will need new FLP when pt is fasting, and she is counseled to return fasting in 1 month. - Will refill current meds.

## 2011-01-29 NOTE — Assessment & Plan Note (Signed)
-   Encourage smoking cessation, pt refuses nicotine patch, chantix, etc at this time, wants to quit on her own. - Counseled to let me know if she is unable to quit on her own and wants help.

## 2011-01-29 NOTE — Assessment & Plan Note (Signed)
Patient having multiple weekly hypoglycemic episodes. Last HgA1c was 5.9.  - Will stop Glipizide, continue diet moedification - Request last diabetic eye exam results from opthalmalogist.

## 2011-01-31 ENCOUNTER — Other Ambulatory Visit: Payer: Self-pay | Admitting: Internal Medicine

## 2011-01-31 NOTE — Assessment & Plan Note (Signed)
Unclear etiology, possibly ischio-gluteal bursitis versus piriformis spasm from prolonged sitting. No symptoms suggestive of lumbar radiculopathy. No overt lateral hip joint pain. No specific indication for imaging at this point.  - Will treat conservatively with her Tramadol, and using NSAIDs - Rx given. - Recommend to follow-up with Dr. Kellie Simmering as scheduled.

## 2011-02-01 ENCOUNTER — Other Ambulatory Visit: Payer: Self-pay | Admitting: *Deleted

## 2011-02-01 DIAGNOSIS — E785 Hyperlipidemia, unspecified: Secondary | ICD-10-CM

## 2011-02-01 MED ORDER — PRAVASTATIN SODIUM 40 MG PO TABS: 40.0000 mg | ORAL_TABLET | Freq: Every day | ORAL | Status: DC

## 2011-02-04 ENCOUNTER — Encounter (INDEPENDENT_AMBULATORY_CARE_PROVIDER_SITE_OTHER): Payer: Medicare Other

## 2011-02-04 DIAGNOSIS — Z48812 Encounter for surgical aftercare following surgery on the circulatory system: Secondary | ICD-10-CM

## 2011-02-04 DIAGNOSIS — I739 Peripheral vascular disease, unspecified: Secondary | ICD-10-CM

## 2011-02-12 NOTE — Procedures (Signed)
BYPASS GRAFT EVALUATION   INDICATION:  Followup, bilateral lower extremity bypass grafts, pain in  hips for the last two months.   HISTORY:  Diabetes:  Yes.  Cardiac:  No.  Hypertension:  Yes.  Smoking:  Yes.  Previous Surgery:  Fem-pop bypass graft on 04/20/01, left fem-pop bypass  graft on 02/14/03.  Both by Dr. Kellie Simmering.   SINGLE LEVEL ARTERIAL EXAM                               RIGHT              LEFT  Brachial:                    152                154  Anterior tibial:             156                153  Posterior tibial:            148                148  Peroneal:                    146                160  Ankle/brachial index:        1.01               1.04   PREVIOUS ABI:  Date: 09/24/06  RIGHT:  1.0  LEFT:  1.0   LOWER EXTREMITY BYPASS GRAFT DUPLEX EXAM:   DUPLEX:  Doppler arterial waveforms appear biphasic proximal to, within,  and distal to bilateral fem-pop bypass grafts.   IMPRESSION:  1. Patent bilateral femoral-popliteal artery bypass grafts.  2. Stable ankle brachial indices from previous study.  3. Patient was exercised and a slight drop to mild arterial disease      was noted, however, drop was not a significant amount and was back      to WNL by minute 2.       ___________________________________________  Nelda Severe. Kellie Simmering, M.D.   AS/MEDQ  D:  02/08/2008  T:  02/08/2008  Job:  [id number]

## 2011-02-12 NOTE — Assessment & Plan Note (Signed)
OFFICE VISIT   [name], Gina REYES  DOB:  Aug 13, 1929                                       02/08/2008  [id number]   The patient returns today for protocol of the lower extremity bypass  grafts.  She had bilateral femoral popliteal grafts in 2004 and in 2002  both with GORE-TEX.  Both functioning nicely with 100% pressures.  She  does describe severe hip discomfort bilaterally immediately upon walking  after about 50 feet which limits her to walking about one block.  She  states that this does not progress down into the thigh and calf area.  She denies any other significant symptoms.   EXAMINATION:  Blood pressure 188/73, heart rate 69, respirations 14.  She has excellent femoral and popliteal pulses bilaterally with well-  perfused lower extremities.  She underwent an exercise test today in the  office.  ABIs were 100% at rest and did not decrease with ambulation in  the hall.  There was no change in waveforms.   I have reassured her regarding these findings and she will continue to  be followed on protocol.   Nelda Severe Kellie Simmering, M.D.  Electronically Signed   JDL/MEDQ  D:  02/08/2008  T:  02/09/2008  Job:  [id number]

## 2011-02-12 NOTE — Assessment & Plan Note (Signed)
OFFICE VISIT   Gina [name], Gina [name]  DOB:  1929-05-10                                       02/22/2010  [id number]   REASON FOR VISIT:  Protocol, status post bilateral lower extremity  bypass.   Patient is a 77 year old black female with a history of bilateral  femoral to above-knee popliteal artery bypass grafts using Gore-TEX.  The left was done in May 2004.  The right was done in July 2002.  She  has been doing well since, although still with some hip discomfort  bilaterally immediately upon walking.  She reports that she is able to  take a pain pill around 7 in the morning, however, and then does not  really experience these symptoms throughout the day.  She has no  claudication symptoms or nonhealing ulcers.  She denies rest pain.   Her past medical history is significant for diabetes mellitus type 2,  hypertension, history of what she describes as a mini stroke in the late  1970s where she had right facial pain and a temporary red streak across  her cheeks, history of right breast intraductal carcinoma, status post  partial mastectomy in 2005, and history of left second toe fracture.   She denies any premature cardiovascular disease in her first-degree  relatives.  She is widowed with 1 child.  She is a retired Museum/gallery conservator.  She smokes a half pack of cigarettes a day and does  not drink alcohol on a regular basis.   REVIEW OF SYSTEMS:  She reports a weight of 105 pounds and height of 5  feet 7.  CARDIAC:  She denies any chest pain or arrhythmias.  PULMONARY:  She denies any shortness of breath or chronic cough.  GI:  She denies any hematochezia or dysphagia.  GU:  She denies any dysuria.  VASCULAR:  As above.  NEUROLOGIC:  She denies any recent dizziness or syncope.  MUSCULOSKELETAL:  As mentioned, she has occasional bilateral hip pain  with walking.  PSYCHIATRIC:  She denies a history of depression or anxiety.  ENT:  She  describes a history of goiter for many years.  HEMATOLOGIC:  She denies any clotting disorders or history of anemia.   DIAGNOSTIC STUDIES:  She underwent bilateral lower extremity ABIs  showing a reading of 1.03 on the right and 0.97 on the left with no  significant change from her previous study 1 year ago.   PHYSICAL EXAMINATION:  Heart rate of 57, respirations 20, blood pressure  162/51 in the right arm and 148/55 in the left arm.  General appearance:  This is a well-developed, pleasant white female in no acute distress.  Her head is normocephalic/atraumatic.  Mucous membranes are pink and  moist.  Her neck shows palpable carotid pulses but bilateral bruits.  She does have evidence of a small-to-moderate size goiter.  Lung sounds  are clear.  Her heart has a regular rate and rhythm.  No murmur was  appreciated.  Her abdomen is soft, nontender, nondistended.  Neurologic  exam was nonfocal.  Lower extremities show no edema with biphasic  femoral Doppler signals.  She has biphasic posterior tibial and anterior  tibial Doppler signals bilaterally and biphasic peroneal signal on the  left.  She has no nonhealing ulcers.  She has corn-type lesions on her  second toes bilaterally  and plantar calluses on bilateral feet.   Patient continues to do well following her bilateral femoral popliteal  bypass grafts.  She has had no significant change in her bilateral hip  discomfort, and currently this is controlled with her pain medication.  She has had no nonhealing ulcers.   We will plan to see her back in 1 year with a P-scan/ABIs.  It appears  she has not had a bypass graft evaluation since 2009.  In regards to her  carotid bruits, we will get a bilateral carotid duplex scan.  I offered  for this to be done today at her visit.  However, she is under some time  constraints and wishes to have this done at a later date.  Although she  has a history of what she calls a mini stroke, this was over 30  years  ago and has been asymptomatic since.  Therefore, we will have her come  back in 1 to 3 months for carotid duplex.  She can call us sooner if any  issues arise.   Jacinta Shoe, PA   V. Leia Alf, MD  Electronically Signed   AWZ/MEDQ  D:  02/22/2010  T:  02/22/2010  Job:  [id number]

## 2011-02-14 NOTE — Procedures (Unsigned)
BYPASS GRAFT EVALUATION  INDICATION:  Followup bilateral bypass graft placements.  HISTORY: Diabetes:  Yes. Cardiac:  No. Hypertension:  Yes. Smoking:  Yes. Previous Surgery:  Right femoral to popliteal Gore-Tex bypass graft 04/20/2001.  Left femoral to popliteal Gore-Tex bypass graft on 02/11/2003.  SINGLE LEVEL ARTERIAL EXAM                              RIGHT              LEFT Brachial:                    221                211 Anterior tibial:             205                203 Posterior tibial:            177                184 Peroneal: Ankle/brachial index:        0.93               0.92  PREVIOUS ABI:  Date:  RIGHT:  LEFT:  LOWER EXTREMITY BYPASS GRAFT DUPLEX EXAM:  DUPLEX:  Doppler arterial waveforms appear biphasic proximally, mid and distal to bilateral femoral to popliteal bypass grafts.  IMPRESSION: 1. Patent bilateral femoral to popliteal bypass grafts. 2. Ankle brachial indices are slightly declined as compared with the     previous study.   ___________________________________________ Nelda Severe Kellie Simmering, M.D.  EM/MEDQ  D:  02/04/2011  T:  02/04/2011  Job:  [id number]

## 2011-02-15 NOTE — Op Note (Signed)
NAME:  Gina [name], GRUNDSTROM                        ACCOUNT NO.:  [id number]   MEDICAL RECORD NO.:  [id number]                   PATIENT TYPE:  INP   LOCATION:  2867                                 FACILITY:  [facility]   PHYSICIAN:  Nelda Severe. Kellie Simmering, M.D.               DATE OF BIRTH:  1929-02-04   DATE OF PROCEDURE:  02/14/2003  DATE OF DISCHARGE:                                 OPERATIVE REPORT   PREOPERATIVE DIAGNOSIS:  Left femoral popliteal occlusive disease and tibial  occlusive disease with debilitating claudication of the left leg.   POSTOPERATIVE DIAGNOSIS:  Left femoral popliteal occlusive disease and  tibial occlusive disease with debilitating claudication of the left leg.   PROCEDURE:  Left common femoral to popliteal (above-knee) bypass using 6 mm  Gore-Tex graft with intraoperative arteriogram.   SURGEON:  Nelda Severe. Kellie Simmering, M.D.   ASSISTANT:  First assistant; Rosetta Posner, M.D.  Second assistant; Sheliah Hatch, P.A.   ANESTHESIA:  General endotracheal.   DESCRIPTION OF PROCEDURE:  The patient was taken to the operating room and  placed in the supine position at which time, satisfactory general  endotracheal anesthesia was administered.  The left leg was prepped with  Betadine scrub and solution and draped in the routine sterile manner.  A  short longitudinal incision was made in the inguinal region, carried down  through the subcutaneous tissue, and the saphenous vein was identified at  the saphenofemoral junction.  It almost immediately trifurcated into three  branches, none of which was dominant and as had been the case in the  contralateral leg when surgery was performed, it was not felt to be optimal  for bypass grafting, therefore it was left in place. The common,  superficial, and profunda femoris arteries were dissected free in the  inguinal incision. The bifurcation of the profunda from the common femoral  was quite high into the inguinal ligament. There was some  atherosclerotic  disease in the common femoral artery, but there was an excellent pulse in  the soft vessel anteriorly.  The popliteal artery was then exposed through a  medial incision above the knee and dissected down as low as possible where  it was a soft vessel. Subfascial anatomic tunnel was created.  A 6 mm Gore-  Tex graft delivered through the tunnel and the patient heparinized. The  popliteal artery was then occluded proximally and distally with vascular  clamps, opened longitudinally with the 15 blade, and extended with Potts  scissors and would except a 4 mm dilator distally and had good backbleeding.  Gore-Tex was spatulated and anastomosed end-to-side with 6-0 Prolene.  Following this, the vessels in the inguinal region were occluded with  vascular clamps. A longitudinal opening made in the origin of the profunda  and extended proximally up into the common femoral artery. The profunda was  widely patent.  Superficial femoral artery was patent proximally, but was  diseased distally.  The Gore-Tex was spatulated and anastomosed end-to-side  with 6-0 Prolene. Clamps were released and there was an excellent pulse in  the graft and a strong dorsalis pedis pulse in the foot with good Doppler  flow.  Intraoperative arteriogram revealed a widely patent anastomosis with  the best vessel being the anterior tibial artery, but patent peroneal and  posterior tibial arteries which were diseased throughout.  Protamine was  given to  reverse the heparin.  Following adequate hemostasis, the wound was irrigated  with saline, closed in layers with Vicryl and clips. A sterile dressing was  applied. The patient was taken to the recovery room in satisfactory  condition.                                               Nelda Severe Kellie Simmering, M.D.    JDL/MEDQ  D:  02/14/2003  T:  02/14/2003  Job:  [id number]

## 2011-02-15 NOTE — Op Note (Signed)
NAME:  Gina [name], Gina [name]                        ACCOUNT NO.:  [id number]   MEDICAL RECORD NO.:  [id number]                   PATIENT TYPE:  AMB   LOCATION:  [place]                                  FACILITY:  [facility]   PHYSICIAN:  Rudell Cobb. Annamaria Boots, M.D.                DATE OF BIRTH:  1928-12-20   DATE OF PROCEDURE:  DATE OF DISCHARGE:                                 OPERATIVE REPORT   OUTPATIENT   PREOPERATIVE DIAGNOSIS:  Intraductal carcinoma of the right breast.   POSTOPERATIVE DIAGNOSIS:  Intraductal carcinoma of the right breast.   PROCEDURE:  Right partial mastectomy with needle localization and specimen  mammogram.   SURGEON:  Rudell Cobb. Annamaria Boots, M.D.   ANESTHESIA:  MAC.   OPERATIVE PROCEDURE:  The patient placed on the operating table with arm  extended on the arm board.  Prior to coming to the operating room a  localizing wire had been placed in the upper outer quadrant of the right  breast.  We then prepped her and draped her in the usual fashion.  A curved  incision using the previously placed wire as a guide was then outlined and  the area thoroughly infiltrated with a mixture of 1% Xylocaine and 0.25%  Marcaine.  Incision was made and then the wire and surrounding tissue were  excised.   Specimen mammogram confirmed the removal of the microcalcifications.  Hemostasis obtained with the cautery; subcuticular closure of 4-0 Monocryl  and Steri-Strips carried out.  Dressings applied.  The patient transferred  to the recovery room in satisfactory condition having tolerated the  procedure well.                                               Rudell Cobb. Annamaria Boots, M.D.    PRY/MEDQ  D:  02/03/2004  T:  02/04/2004  Job:  [id number]

## 2011-02-15 NOTE — Op Note (Signed)
[place]  Patient:    Gina [name], Gina [name]                     MRN: [id number] Proc. Date: 04/16/01 Adm. Date:  [id number] Attending:  Derry Lory CC:         Catheterization lab   Operative Report  PREOPERATIVE DIAGNOSIS:  Bilateral lower extremity claudication, right worse than left, secondary to femoral popliteal occlusive disease.  PROCEDURE:  Abdominal aortogram via right common femoral approach with bilateral lower extremity runoff.  SURGEON:  Nelda Severe. Kellie Simmering, M.D.  ANESTHESIA:  Local Xylocaine, Versed 2 mg intravenously.  DESCRIPTION OF PROCEDURE:  The patient was taken to the [facility] peripheral endovascular lab, placed in the supine position, at which times both groins were prepped with Betadine solution, draped in routine sterile manner.  After infiltration with 1% Xylocaine the right common femoral artery was entered percutaneously, a guidewire passed into the suprarenal aorta under fluoroscopic guidance.  A 5 French sheath and dilator were passed over the guidewire, dilator removed, and a standard pigtail catheter positioned in the suprarenal aorta.  Flush abdominal aortogram was performed, injecting 20 cc of contrast at 20 cc/second.  This revealed the aorta to be widely patent.  It was small in caliber but had no significant stenotic areas.  There were single renal arteries bilaterally which had no stenoses and inferior mesenteric artery was patent.  Both common iliac arteries, internal, and external iliac arteries were patent.  There was some mild plaque formation throughout the left external iliac artery, causing minimal stenosis.  The catheter was withdrawn into the terminal aorta and bilateral lower extremity runoff performed, injecting 88 cc of contrast at 8 cc/second.  This revealed the right superficial femoral artery to be occluded in the proximal third of the leg and reconstituting about 10 cm above the knee,  being patent across the knee joint, there being three-vessel runoff on the right.  On the left there was occlusion of the superficial femoral artery in the distal side which reconstituted 10 cm above the knee and traversed the knee joint with three-vessel runoff on the left as well.  Tibial vessels were small bilaterally.  The pigtail catheter was removed over the guidewire and better definition of the right leg around the knee joint was performed using the ______ technique, injecting an additional 20 cc of contrast.  This revealed the superficial femoral to be reconstituted distally with the popliteal artery patent across the knee joint and three-vessel runoff.  The sheath was then removed with compression applied.  No complications ensued.  The patient taken to short-stay in stable condition.  FINDINGS: 1. Small but widely-patent infrarenal aorta with single renal arteries    bilaterally. 2. Mild plaque formation left external iliac artery with no significant    stenosis. 3. Bilateral superficial femoral occlusions with reconstitution 10 cm above    knee joint and three-vessel runoff bilaterally with small tibial vessels. DD:  04/16/01 TD:  04/16/01 Job: [id number]

## 2011-02-15 NOTE — Discharge Summary (Signed)
NAME:  Gina [name], Gina [name]                        ACCOUNT NO.:  [id number]   MEDICAL RECORD NO.:  [id number]                   PATIENT TYPE:  INP   LOCATION:  2028                                 FACILITY:  [facility]   PHYSICIAN:  Nelda Severe. Kellie Simmering, M.D.               DATE OF BIRTH:  04/25/1929   DATE OF ADMISSION:  02/14/2003  DATE OF DISCHARGE:  02/18/2003                                 DISCHARGE SUMMARY   PRIMARY CARE PHYSICIAN:  Thornell Mule, M.D., at [facility].   DIAGNOSES:  1. Left femoral to popliteal  occlusive and tibial disease.  2. Left leg claudication.  3. Hypertension.  4. Hypercholesterolemia.  5. Diabetes.  6. History of right above knee amputation femoral to popliteal  bypass     graft.  7. History of ankle surgery after fracture.  8. History of smoking one pack per day.   PROCEDURE:  Left common femoral to above knee amputation popliteal bypass  graft with 6 mm Gortex and intraoperative arteriogram on Feb 14, 2003.   MEDICATIONS:  She is resuming her home medications. These include:  1. Aspirin 81 mg daily.  2. Glipizide 5 mg daily.  3. Hydrochlorothiazide 25 mg daily.  4. Lipitor 10 mg daily.  5. She will be given Tylox one to two every four to six hours p.r.n. for     pain also.   ALLERGIES:  No known drug allergies.   HISTORY OF PRESENT ILLNESS:  The patient is a 77 year old black female who  has known peripheral vascular disease and previously underwent a right  femoral-popliteal bypass graft for claudication in July of 2002. She  recently returned to the office with progressive claudication in the left  calf which became intolerable.  She saw Dr. Kellie Simmering and Dopplers at that time  showed a left SFA occlusion with ABI 0.46 and 0.88 on the right.  He  recommended left femoral-popliteal bypass graft.   HOSPITAL COURSE:  She came to the hospital for the procedure on Feb 14, 2003.  There were no complications.  Postoperative ABIs were  done.  This  showed 0.90 on the left, greater than 1.0 on the right.  She was transferred  to unit 2000 on postoperative day #1.  She remained medically stable.  There  were no problems.   On Feb 17, 2003, postoperative day #3, her vital signs were stable.  Physical examination was satisfactory.  She has a palpable left dorsal pedis  pulse.  Wounds are healing well. She was ambulating okay.  She should be  able to discharge tomorrow Feb 18, 2003.   CONDITION ON DISCHARGE:  Stable.   DISPOSITION:  Home.   FOLLOW UP:  1. The patient will follow up with Dr. Kellie Simmering in three weeks.  The office     will call with the appointment.  2. Thornell Mule, M.D., March 13, 2003, for follow-up.  Sheliah Hatch, P.A.                          Nelda Severe Kellie Simmering, M.D.    Griffin Dakin  D:  02/17/2003  T:  02/18/2003  Job:  [id number]   cc:   Thornell Mule, M.D.  Int Medicine Resident - [place], [place] [address]  Fax: [phone]

## 2011-02-15 NOTE — Procedures (Signed)
[place]  Patient:    Gina [name], Gina [name]                     MRN: [id number] Proc. Date: 09/26/00 Adm. Date:  [id number] Attending:  Christy Sartorius CC:         Hortencia Pilar, M.D.  Rushie Nyhan, M.D.   Procedure Report  DIAGNOSES: 1. Peripheral vascular disease. 2. Claudication.  PROCEDURES: 1. Abdominal aortogram. 2. Bilateral lower extremity angiogram.  HISTORY:  Gina [name] is a 77 year old black female who presents with a progressive history of bilateral lower extremity claudication.  The patient has tried multiple medications without benefit, and she has had difficulty initiating an exercise program, again, due to bilateral leg pain with low-level activity.  Ankle brachial indices on June 10, 2000, were 0.25 on the right and 0.45 on the left.  Due to persistence of symptoms which appear to be lifestyle-limiting, the patient presents for a lower extremity angiogram.  TECHNIQUE:  Informed consent was obtained from the patient.  She was brought to the catheterization lab.  Both groins were sterilely prepped and draped. Lidocaine 1% was used to infiltrate the left groin and a 6 French sheath placed in the left femoral artery.  A pigtail catheter was then advanced in the descending aorta and abdominal aortogram performed using power injections of contrast.  Left lower extremity angiogram was then performed using power injection of contrast through the left femoral sheath.  An IMA catheter was the used to place a Wholey wire across the iliac bifurcation, end-hole catheter positioned in the right iliac artery.  A right lower extremity angiogram was then performed using power injections of contrast.  At the termination of the case, catheters and sheath were removed, manual pressure applied until adequate hemostasis was achieved.  The patient tolerated the procedure well and was transferred to the holding area in stable  condition.  FINDINGS: 1. Abdominal aorta is of normal caliber with mild atheromatous buildup.  The    renal arteries are single and patent bilaterally. 2. Left lower extremity:  The left iliac and common femoral arteries have mild    diffuse disease in areas of not greater than 20-30%.  The superficial    femoral artery is patent in its proximal segment.  There is a focal    narrowing of 70% at the junction of the proximal and mid-thirds.  There is    a segment of severely-diseased vessel in the midsection, where narrowing is    up to 90%.  The distal SFA has mild diffuse disease extending into the    popliteal artery.  Trifurcation vessels are patent in the proximal segment,    although the peroneal artery is a trivial vessel that is serially diffusely    diseased.  The posterior tibial artery has a narrowing of 70% in the    proximal segment and is truncated at the level of the ankle. 3. Left lower extremity:  The right iliac and common femoral arteries have    mild diffuse disease and narrowings of 20-30%.  The superficial femoral    artery is patent in the proximal segment; however, this is followed by a    long segment of severe diffuse disease of 70-80%.  This culminates in a    long occlusion in the midsection of the vessel.  The distal SFA seemed to    reconstitute via collateral flow and is a small-caliber vessel.  The  popliteal artery has mild diffuse disease.  Trifurcation vessels are patent    in the proximal segment with mild diffuse disease noted.  The peroneal    artery is noted to truncate at the level of the ankle.  ASSESSMENT AND PLAN:  Gina [name] is a 77 year old black female with severe peripheral vascular disease and lifestyle-limiting claudication.  She has severe reduction of peripheral perfusion in the right lower extremity and moderate reduction of the left due to a long-segment occlusion in the right superficial femoral artery as well as small caliber of the  vessels and newly-diagnosed diabetes mellitus.  The risk of restenosis for percutaneous intervention is high, and thus the patient should be considered for femoral-popliteal bypass.  Following this, consideration may be given toward percutaneous or surgical revascularization of the left lower extremity. DD:  09/26/00 TD:  09/26/00 Job: [id number]

## 2011-02-15 NOTE — H&P (Signed)
NAME:  Gina [name], BREARLEY NO.:  [id number]   MEDICAL RECORD NO.:  [id number]                   PATIENT TYPE:  INP   LOCATION:                                       FACILITY:  [facility]   PHYSICIAN:  Nelda Severe. Kellie Simmering, M.D.               DATE OF BIRTH:  09-Sep-1929   DATE OF ADMISSION:  02/14/2003  DATE OF DISCHARGE:                                HISTORY & PHYSICAL   CHIEF COMPLAINT:  Left calf claudication (debilitating).   HISTORY OF PRESENT ILLNESS:  This 77 year old African-American female  patient, who has previously undergone right femoral-popliteal bypass  grafting for severe right leg claudication symptoms in July 2002, now  returns with progressive claudication in the left calf which limits her to  waking less than one-quarter of a block.  She has discomfort in the left  calf and foot with som occasional numbness.  She denies any rest pain or  nonhealing ulcerations but states that this has progressed to the point that  she cannot tolerate it.   PAST MEDICAL HISTORY:  Negative for coronary artery disease, myocardial  infarction, chronic obstructive pulmonary disease.  She does have a history  of diabetes mellitus, hypertension treated medically, and a mini stroke 12  years ago.   PAST SURGICAL HISTORY:  1. Right above-knee femoral-popliteal bypass graft.  2. Hysterectomy in 1975.  3. Ankle repair following ankle fracture.   ALLERGIES:  None known.   FAMILY HISTORY:  Negative for coronary artery disease.  There is a brother  with diabetes mellitus.  Negative for stroke.   SOCIAL HISTORY:  Smokes one pack cigarettes per day, does not use alcohol on  a regular basis.   REVIEW OF SYSTEMS:  Otherwise unremarkable.   PHYSICAL EXAMINATION:  VITAL SIGNS:  Blood pressure 128/70, heart rate 76  and regular, respiratory rate 18.  GENERAL:  Healthy-appearing female who is in no apparent distress.  Alert  and oriented x 3.  NECK:  Supple with 3+  carotid pulses.  No bruits are audible.  NEUROLOGIC:  Normal.  EXTREMITIES:  Upper extremity pulses are 2+ at brachial and radial level.  She has 3+ femoral and 2+ popliteal, 2+ dorsalis pedis and posterior tibial  pulses in the right leg.  The left leg has a 3+ femoral pulse with absent  distal pulses.  Left foot is slightly cooler than the right with no evidence  of infection or ulceration.  There is no distal edema.  CHEST:  Clear to auscultation.  CARDIOVASCULAR:  Regular rhythm with no murmurs.  ABDOMEN:  Soft, nontender, with no masses.   LABORATORY DATA:  Lower extremity arterial Dopplers performed on 10/12/2002  revealed left superficial femoral occlusion with ABI of 0.46 on the left and  0.88 on the right.  Previous angiography has revealed left superficial  femoral occlusion with above-knee popliteal artery reconstitution and small  tibial vessels.  IMPRESSION:  1. Left superficial femoral occlusion with limiting claudication.  2. Diabetes mellitus.  3. Hypertension.  4. Hypercholesterolemia.   PLAN:  Admission on Feb 14, 2003 for left femoral-popliteal bypass graft  possibly with Gore-Tex if saphenous vein is small and inadequate as it was  in the contralateral right leg.                                               Nelda Severe Kellie Simmering, M.D.    JDL/MEDQ  D:  01/25/2003  T:  01/25/2003  Job:  [id number]

## 2011-02-15 NOTE — Discharge Summary (Signed)
[place]. [facility]  Patient:    Gina [name], Gina [name]                     MRN: [id number] Adm. Date:  [id number] Disc. Date: [id number] Attending:  Derry Lory Dictator:   Vernard Gambles, P.A. CC:         Christy Sartorius, M.D.  Kerry Hough., M.D.  CVTS   Discharge Summary  DATE OF BIRTH:  [birth year]  PRIMARY ADMISSION DIAGNOSIS:  Right femoral-to-popliteal occlusive disease with severe claudication symptoms.  SECONDARY DIAGNOSES/PAST MEDICAL HISTORY: 1. Peripheral vascular occlusive disease 2. Hypertension. 3. Hyperlipidemia. 4. Diabetes mellitus. 5. History of tobacco abuse. 6. Status post mini stroke in the 1970s.  NEW DIAGNOSES/DISCHARGE DIAGNOSES: 1. Status post right femoral-to-above knee popliteal bypass graft with    Gore-Tex. 2. Postoperative fever, resolved. 3. Urinary tract infection. 4. Cellulitis left lower extremity.  PROCEDURES: 1. Right lower extremities femoral-to-above knee popliteal bypass graft with    Gore-Tex and intraoperative arteriogram done on April 20, 2001. 2. Postoperative arterial brachial indices and Doppler evaluation on    April 21, 2001.  HOSPITAL COURSE:  This patient is a 77 year old female who was referred by Dr. Owens Shark for evaluation of peripheral vascular occlusive disease with severe lower extremity claudication symptoms in the right more than the left lower extremity.  The pain was worse walking up any hills and on the ground she cannot ambulate greater than 1/2 block.  Previous Dopplers on May 02, 2000 showed superficial femoral anterior disease with the right ABI 27% and the left ABI of 46%.  The patient underwent arteriogram on April 16, 2001 to further evaluate the disease, which confirmed that she had peripheral vascular occlusive disease.  She was seen and evaluated by Dr. Kellie Simmering, who determined that the patient would benefit from bypass surgery in her right lower extremity.  The  patient agreed to continue and the patient underwent the procedure as stated above on April 20, 2001.  She tolerated the procedure well. Her postoperative course was essentially uneventful.  She did have a postoperative fever, which has since resolved and it was attributed to a urinary tract infection.  She was treated with antibiotics and was afebrile at the time of discharge.  She remained hemodynamically stable.  She did not have any cardiac or respiratory complications.  She was ambulated by the mobility team and it was determined that the patient was stable for discharge on April 23, 2001.  She was discharged home with a rolling walker and with a home health nurse for restorative care.  CONDITION ON DISCHARGE:  Stable and improved.  DISCHARGE MEDICATIONS: 1. Coated aspirin 325 mg daily. 2. Cipro 500 mg 1 tablet b.i.d. 3. Tylox 1-2 tablets every 4-6 hours as needed for pain. 4. Lipitor 10 mg daily. 5. Hydrochlorothiazide 25 mg daily. 6. Glucotrol XL 5 mg daily.  ACTIVITY:  The patient was instructed to walk daily, to keep her right leg elevated as much as possible.  DIET:  She is to follow a diabetic diet.  WOUND CARE:  She was told she can cleanse her wounds daily with soap and water and keep them dry, 4 x 4 gauze in the fold of right groin.  DISCHARGE INSTRUCTIONS:  She is to notify the office if there is any increased redness, drainage, or swelling from any of her incisions or if she has a fever greater than 101.5.  FOLLOW-UP:  She has a staple  removal appointment in Dr. Mardi Mainland office on Thursday, August 1, at 10:30 a.m. and she is to make her followup appointment with Dr. Kellie Simmering at that time. DD:  05/05/01 TD:  05/07/01 Job: [id number]

## 2011-02-15 NOTE — Op Note (Signed)
NAME:  Gina [name], Gina [name]                        ACCOUNT NO.:  [id number]   MEDICAL RECORD NO.:  [id number]                   PATIENT TYPE:  OIB   LOCATION:  [id number]                                 FACILITY:  [facility]   PHYSICIAN:  Nelda Severe. Kellie Simmering, M.D.               DATE OF BIRTH:  06-Oct-1928   DATE OF PROCEDURE:  02/09/2003  DATE OF DISCHARGE:                                 OPERATIVE REPORT   PREOPERATIVE DIAGNOSIS:  Severe claudication of the left leg with left  superficial femoral and tibial occlusive disease.   PROCEDURE:  Abdominal aortogram with bilateral lower extremity runoff via  left common femoral approach.   SURGEON:  Nelda Severe. Kellie Simmering, M.D.   ANESTHESIA:  Local Xylocaine and Versed 2 mg intravenously.   COMPLICATIONS:  None.   CONTRAST:  135 mL.   DESCRIPTION OF PROCEDURE:  The patient was taken to the [facility]  peripheral endovascular laboratory, placed in the supine position, at which  time both groins were prepped with Betadine solution and draped in a routine  sterile manner.  After infiltration with 1% Xylocaine the left common  femoral artery was entered percutaneously, a guidewire passed into the  suprarenal aorta under fluoroscopic guidance.  A 5 French sheath and dilator  were passed over the guidewire, the dilator removed, and a standard pigtail  catheter positioned in the suprarenal aorta.  A flush abdominal aortogram  was performed, injection 20 mL of contrast at 20 mL/second.  This revealed  the aorta to be widely patent with single widely patent renal arteries  bilaterally.  The inferior mesenteric artery was patent.  The infrarenal  aorta was small.  Both common internal and external iliac arteries were  widely patent with some plaque formation in the left external iliac artery  which was about 5 cm in length, causing about a 40% stenosis proximally.  Both internal iliac arteries were patent.  The catheter was withdrawn into  the terminal aorta and  bilateral lower extremity runoff performed, injecting  88 mL of contrast at 8 mL/second.  This revealed the right common femoral  and profunda to be widely patent.  There was a right femoral-popliteal  bypass to the above-knee popliteal artery which was patent and three-vessel  runoff with small vessels on the right - the best being the anterior tibial  artery, with disease in the peroneal and posterior tibial arteries. On the  left, the common femoral and profunda femoris arteries were patent and there  was total occlusion of the left superficial femoral artery with  reconstitution of the popliteal artery about 8 cm above the knee joint with  patency across the knee joint and essentially one-vessel runoff through the  anterior tibial artery with some patency of the peroneal and posterior  tibial arteries - both of which were diseased, the posterior tibial artery  being occluded proximally.  Better views were obtained using the peek-hold  technique on the left side, and following this the pigtail catheter was  removed over the guidewire, the sheath removed, adequate compression  applied, no complications ensued.   FINDINGS:  1. Widely patent right femoral-popliteal bypass graft to above-knee     popliteal artery with diseased tibial vessels but three-vessel runoff on     the right.  2.     Left superficial femoral occlusion with above-knee popliteal artery     reconstitution and one-vessel runoff on the left through anterior tibial     artery, and diseased peroneal and posterior tibial arteries.  3. Widely patent aortoiliac system with mild left external iliac occlusive     disease.                                               Nelda Severe Kellie Simmering, M.D.    JDL/MEDQ  D:  02/09/2003  T:  02/09/2003  Job:  [id number]
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
## 2011-02-15 NOTE — Op Note (Signed)
[place]. [facility]  Patient:    Gina [name], Gina [name]                       MRN: [id number] Proc. Date: 04/20/01 Attending:  Nelda Severe. Kellie Simmering, M.D.                           Operative Report  PREOPERATIVE DIAGNOSIS:  Severe claudication of right lower extremity with occasional rest ischemia, secondary to superficial femoral occlusive disease.  POSTOPERATIVE DIAGNOSIS:  Severe claudication of right lower extremity with occasional rest ischemia, secondary to superficial femoral occlusive disease.  OPERATION:  Right common femoral to popliteal (above knee) bypass using a 6 mm stretch Gore-Tex graft with intraoperative arteriogram.  SURGEON:  Nelda Severe. Kellie Simmering, M.D.  ASSISTANT:  Sheliah Hatch, P.A.  ANESTHESIA:  General endotracheal.  DESCRIPTION OF PROCEDURE:  The patient was taken to the operating room, placed in the supine position, at which time satisfactory general endotracheal anesthesia was administered.  The right leg was prepped with Betadine scrubbing solution, draped in a routine sterile manner.  A longitudinal incision was made in the inguinal region, carried down through subcutaneous tissue, and the common, superficial, and profundus femoris arteries were dissected free and encircled with vessiloops.  The saphenous vein was exposed. There were two trunks, an anterior trunk which was more medial, and a second trunk which initially traversed in the usual position, and then went more posterior.  Neither of these appeared very large.  Since the patient had a patent above knee popliteal artery with three vessel run-off, it was decided to use Gore-Tex graft to the above knee position.  Therefore, a medial incision was made above the knee, and the popliteal artery exposed distal to the adductor canal where it was a soft mildly diseased vessel widely patent on the angiogram.  A subfascial tunnel was created, and a 40 cm 6 mm PTSE Gore-Tex graft (stretch) was  delivered through the tunnel.  The patient was then heparinized.  The popliteal artery was occluded proximally and distally with vessiloops, open with a 15 blade, extended with the Potts scissors, it would easily accept a 4 mm dilator distally.  Had very mild disease.  Gore-Tex was spatulated, and anastomosed end-to-side using continuous 6-0 Prolene. Following this, the femoral vessels were occluded with vascular clamps. Longitudinal opening made in the common femoral artery with a 15 blade, extended with Potts scissors proximal into the graft, was spatulated and anastomosed end-to-side using continuous 6-0 Prolene.  Clamp was then released, and there was an excellent pulse in the graft, and a palpable posterior tibial pulse at the ankle.  Intraoperative arteriogram revealed a widely patent anastomosis with three vessel run-off.  Protamine was then given to reverse the heparin.  Following adequate hemostasis, the wounds were irrigated with saline, closed in layers with Vicryl with clips, sterile dressing applied.  The patient was taken to the recovery room in satisfactory condition. DD:  04/20/01 TD:  04/20/01 Job: [id number]

## 2011-02-18 ENCOUNTER — Encounter: Payer: Self-pay | Admitting: Internal Medicine

## 2011-02-18 ENCOUNTER — Ambulatory Visit (INDEPENDENT_AMBULATORY_CARE_PROVIDER_SITE_OTHER): Payer: Medicare Other | Admitting: Internal Medicine

## 2011-02-18 DIAGNOSIS — I10 Essential (primary) hypertension: Secondary | ICD-10-CM

## 2011-02-18 DIAGNOSIS — E785 Hyperlipidemia, unspecified: Secondary | ICD-10-CM

## 2011-02-18 DIAGNOSIS — E119 Type 2 diabetes mellitus without complications: Secondary | ICD-10-CM

## 2011-02-18 LAB — GLUCOSE, CAPILLARY: Glucose-Capillary: 118 mg/dL — ABNORMAL HIGH (ref 70–99)

## 2011-02-18 NOTE — Assessment & Plan Note (Signed)
Her blood pressure today is 148/82. There was some misunderstanding and so patient did not get her metoprolol filled and was taking lisinopril 20 mg and lisinopril/HCTZ 20/12.5 mg daily. To the bottle of lisinopril/HCTZ and told her to throw that away to avoid any further confusion. Also told her to go to the [facility] from here and get a prescription of metoprolol filled and take that half pill daily. Wrote that in the instruction sheet and explained her again. She sounded understanding.

## 2011-02-18 NOTE — Assessment & Plan Note (Signed)
Will continue Pravachol for now. We'll check fasting panel during the next visit.

## 2011-02-18 NOTE — Progress Notes (Signed)
  Subjective:    [name], female    DOB: 27-Dec-1928, 77 y.o.   MRN: [id number]  HPI Gina [name] is a pleasant 77 year old woman with past medical history of hypertension, diabetes type 2 of the clinic for a followup visit for hypertension and diabetes. Neurologic visit or her left medication glipizide was stopped due to her frequent hypoglycemic episodes. Today her CBG shows a glucose of 118. Her blood pressure was 148/85. She was prescribed metoprolol 12,5 mg by mouth daily last visit but she never got it filled from medical store. She was still taking lisinopril 20 mg daily and lisinopril HCTZ 20/12.5 mg daily. That means she was taking 40 mg lisinopril and 12.5 mg HCTZ. She was still having some dizziness which she had during the last visit which was thought to be secondary to HCTZ and so explained to her again to throw the bottle of HCTZ lisinopril  combination and just continue taking lisinopril and get her Metroprolol filled. She complains of some dizziness at times but denies loss of consciousness, for, vomiting passing through the body, nausea, or vomiting, visual changes, headache, fever, chills, urinary abnormalities. She also denies any chest pain, shortness of breath, cough, abdominal pain.  Review of Systems     as per history of present illness Objective:   Physical Exam    Constitutional: Vital signs reviewed.  Patient is a well-developed and well-nourished in no acute distress and cooperative with exam. Alert and oriented x3.  Head: Normocephalic and atraumatic Mouth: no erythema or exudates, MMM Eyes: PERRL, EOMI, conjunctivae normal, No scleral icterus.  Neck: Supple, Trachea midline normal ROM, No JVD, mass, thyromegaly, or carotid bruit present.  Cardiovascular: RRR, S1 normal, S2 normal, no MRG, pulses symmetric and intact bilaterally Pulmonary/Chest: CTAB, no wheezes, rales, or rhonchi Abdominal: Soft. Non-tender, non-distended, bowel sounds are normal,  no masses, organomegaly, or guarding present.  GU: no CVA tenderness Musculoskeletal: No joint deformities, erythema, or stiffness, ROM full and no nontender Neurological: A&O x3, Strenght is normal and symmetric bilaterally, cranial nerve II-XII are grossly intact, no focal motor deficit, sensory intact to light touch bilaterally.  Skin: Warm, dry and intact. No rash, cyanosis, or clubbing.  Psychiatric: Normal mood and affect. speech and behavior is normal. Judgment and thought content normal. Cognition and memory are normal.       Assessment & Plan:

## 2011-02-18 NOTE — Patient Instructions (Addendum)
Please make a f/u appointment in 3-4 months with Primary care. Please get the refill for Metoprolol 12.5 mg daily for blood pressure, that will be half pill of 25 mg daily. Also stop taking Lisinopril- HCTZ. Just continue taking Lisinopril- 20 mg daily. You are not on any diabetes medication as of now. Just continue following diet fior your diabetes.

## 2011-02-18 NOTE — Assessment & Plan Note (Addendum)
She was still confused about one of the 3 bottles being of her diabetic medication.  Glipizide was already stopped and the 3 bottles which she had did not have glipizide.  explained to her again that she is off all the  diabetic  medication for now and she just has to follow a diabetic diet as explained before. She sounded understanding and was happy to know that she was going in the right direction of managing her diabetes without any pills. Will make a followup appointment in 3-4 months.

## 2011-04-07 ENCOUNTER — Encounter: Payer: Self-pay | Admitting: Internal Medicine

## 2011-07-04 LAB — GLUCOSE, CAPILLARY
Glucose-Capillary: 125 mg/dL — ABNORMAL HIGH (ref 70–99)
Glucose-Capillary: 88 mg/dL (ref 70–99)

## 2011-07-24 ENCOUNTER — Other Ambulatory Visit: Payer: Self-pay | Admitting: Internal Medicine

## 2011-07-24 DIAGNOSIS — Z853 Personal history of malignant neoplasm of breast: Secondary | ICD-10-CM

## 2011-07-25 ENCOUNTER — Ambulatory Visit (INDEPENDENT_AMBULATORY_CARE_PROVIDER_SITE_OTHER): Payer: Medicare Other | Admitting: Internal Medicine

## 2011-07-25 ENCOUNTER — Encounter: Payer: Self-pay | Admitting: Internal Medicine

## 2011-07-25 VITALS — BP 126/65 | HR 66 | Temp 97.2°F | Ht 67.0 in | Wt 160.9 lb

## 2011-07-25 DIAGNOSIS — Z0000 Encounter for general adult medical examination without abnormal findings: Secondary | ICD-10-CM

## 2011-07-25 DIAGNOSIS — I10 Essential (primary) hypertension: Secondary | ICD-10-CM

## 2011-07-25 DIAGNOSIS — M25551 Pain in right hip: Secondary | ICD-10-CM

## 2011-07-25 DIAGNOSIS — M25559 Pain in unspecified hip: Secondary | ICD-10-CM

## 2011-07-25 DIAGNOSIS — E785 Hyperlipidemia, unspecified: Secondary | ICD-10-CM

## 2011-07-25 DIAGNOSIS — E119 Type 2 diabetes mellitus without complications: Secondary | ICD-10-CM

## 2011-07-25 DIAGNOSIS — M25552 Pain in left hip: Secondary | ICD-10-CM

## 2011-07-25 LAB — POCT GLYCOSYLATED HEMOGLOBIN (HGB A1C): Hemoglobin A1C: 6.7

## 2011-07-25 LAB — GLUCOSE, CAPILLARY: Glucose-Capillary: 104 mg/dL — ABNORMAL HIGH (ref 70–99)

## 2011-07-25 MED ORDER — TRAMADOL-ACETAMINOPHEN 37.5-325 MG PO TABS: 1.0000 | ORAL_TABLET | Freq: Four times a day (QID) | ORAL | Status: DC | PRN

## 2011-07-25 MED ORDER — PRAVASTATIN SODIUM 40 MG PO TABS: 40.0000 mg | ORAL_TABLET | Freq: Every day | ORAL | Status: DC

## 2011-07-25 NOTE — Patient Instructions (Signed)
   Please follow-up at the clinic in 3 months with me, at which time we will reevaluate your hip pain and blood pressure.  Please follow-up in the clinic sooner if needed.  If you have been started on new medication(s), and you develop symptoms concerning for allergic reaction, including, but not limited to, throat closing, tongue swelling, rash, please stop the medication immediately and call the clinic at [phone], and go to the ER.  If you are diabetic, please bring your meter to your next visit.  If symptoms worsen, or new symptoms arise, please call the clinic or go to the ER.  Please bring all of your medications in a bag to your next visit.
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
## 2011-07-26 DIAGNOSIS — M25551 Pain in right hip: Secondary | ICD-10-CM | POA: Insufficient documentation

## 2011-07-26 DIAGNOSIS — Z0000 Encounter for general adult medical examination without abnormal findings: Secondary | ICD-10-CM | POA: Insufficient documentation

## 2011-07-26 LAB — COMPREHENSIVE METABOLIC PANEL
ALT: 14 U/L (ref 0–35)
AST: 18 U/L (ref 0–37)
Albumin: 3.6 g/dL (ref 3.5–5.2)
Alkaline Phosphatase: 50 U/L (ref 39–117)
BUN: 17 mg/dL (ref 6–23)
CO2: 20 mEq/L (ref 19–32)
Calcium: 8.5 mg/dL (ref 8.4–10.5)
Chloride: 106 mEq/L (ref 96–112)
Creat: 0.84 mg/dL (ref 0.50–1.10)
Glucose, Bld: 105 mg/dL — ABNORMAL HIGH (ref 70–99)
Potassium: 4.2 mEq/L (ref 3.5–5.3)
Sodium: 139 mEq/L (ref 135–145)
Total Bilirubin: 0.2 mg/dL — ABNORMAL LOW (ref 0.3–1.2)
Total Protein: 6.3 g/dL (ref 6.0–8.3)

## 2011-07-26 LAB — LIPID PANEL
Cholesterol: 196 mg/dL (ref 0–200)
HDL: 72 mg/dL (ref >39)
LDL Cholesterol: 110 mg/dL — ABNORMAL HIGH (ref 0–99)
Total CHOL/HDL Ratio: 2.7 Ratio
Triglycerides: 69 mg/dL (ref <150)
VLDL: 14 mg/dL (ref 0–40)

## 2011-07-26 NOTE — Assessment & Plan Note (Signed)
Liver Function Tests:  Ref. Range 01/16/2010   Alkaline Phosphatase Latest Range: 39-117 U/L 73  Albumin Latest Range: 3.5-5.2 g/dL 4.3  AST Latest Range: 0-37 U/L 20  ALT Latest Range: 0-35 U/L 17  Total Protein Latest Range: 6.0-8.3 g/dL 7.5  Total Bilirubin Latest Range: 0.3-1.2 mg/dL 0.5    Lipid Panel:   Ref. Range 01/16/2010  Cholesterol Latest Range: 0-200 mg/dL 237 (H)  Triglycerides Latest Range: <150 mg/dL 103  HDL Latest Range: >39 mg/dL 71  LDL (calc) Latest Range: 0-99 mg/dL 145 (H)  VLDL Latest Range: 0-40 mg/dL 21  Total CHOL/HDL Ratio No range found 3.3 Ratio    Assessment: Lipid Control: not controlled  Progress toward goals: unable to assess  Barriers to meeting goals: no barriers identified   Plan:  continue current medications  Will recheck FLP today (has not eaten for > 8 hours)

## 2011-07-26 NOTE — Assessment & Plan Note (Signed)
Likely trochanteric bursitis. No symptoms suggestive of lumbar radiculopathy. No specific indication for imaging at this point.   Plan:  Will treat conservatively with her Tramadol, and using NSAIDs   Rx given.   Pt was offered by declined PT - states she wants to first start walking and exercising on her own.  Instructed to return to clinic or ER if symptoms worsen or new symptoms arise.

## 2011-07-26 NOTE — Assessment & Plan Note (Signed)
BP Readings from Last 3 Encounters:  07/25/11 126/65  02/18/11 148/85  01/17/11 [id number]    Basic Metabolic Panel:    Component Value Date/Time   NA 139 07/25/2011 1611   K 4.2 07/25/2011 1611   CL 106 07/25/2011 1611   CO2 20 07/25/2011 1611   BUN 17 07/25/2011 1611   CREATININE 0.84 07/25/2011 1611   CREATININE 0.78 01/16/2010 2237   GLUCOSE 105* 07/25/2011 1611   CALCIUM 8.5 07/25/2011 1611    Assessment: Hypertension Control: Controlled. Notably, the pt never started her Metoprolol. Had prior intolerance to HCTZ due to subjective lightheadedness.   Progress toward goals: at goal  Barriers to meeting goals: no barriers identified   Plan:  continue Lisinopril  DC Metoprolol as pt is not taking this medication and blood pressures are at goal regardless.

## 2011-07-26 NOTE — Assessment & Plan Note (Signed)
   Will request eye exam records from Dr. Katy Fitch.  Refuses PCV/ influenza/ VZV shot - states will never get them due to friend having very bad reaction - was explained the importance and purpose of vaccination, however the pt continues to decline.  Plan foot exam next visit.

## 2011-07-26 NOTE — Assessment & Plan Note (Addendum)
Lab Results  Component Value Date   HGBA1C 6.7 07/25/2011   HGBA1C 6.0 01/17/2011   HGBA1C 5.9 06/18/2010   Lab Results  Component Value Date   MICROALBUR 13.06* 06/19/2010   LDLCALC 110* 07/25/2011   CREATININE 0.84 07/25/2011    Assessment:  Disease Control: controlled. This pt is 77 years old, previously had multiple hypoglycemic episodes when just on Glipizide 5mg , and has an intolerance to Metformin with GI side effects.  Progress toward goals: at goal  Barriers to meeting goals: no barriers identified   Plan: Glucometer log was not reviewed today, as pt did not have glucometer available for review.      Recommend diet control, will not be too aggressive with her control due to her prior very frequent hypoglycemic episodes while just on glipizide 5mg .  reminded to get eye exam  Recommended to continue to check blood sugars if getting symptomatic.

## 2011-08-05 ENCOUNTER — Ambulatory Visit
Admission: RE | Admit: 2011-08-05 | Discharge: 2011-08-05 | Disposition: A | Discharge status: Home / Self Care | Payer: Medicare Other | Source: Ambulatory Visit | Attending: Internal Medicine | Admitting: Internal Medicine

## 2011-08-05 DIAGNOSIS — Z853 Personal history of malignant neoplasm of breast: Secondary | ICD-10-CM

## 2011-08-15 ENCOUNTER — Other Ambulatory Visit: Payer: Self-pay | Admitting: Internal Medicine

## 2011-08-15 DIAGNOSIS — E785 Hyperlipidemia, unspecified: Secondary | ICD-10-CM

## 2011-08-15 MED ORDER — SIMVASTATIN 40 MG PO TABS: 40.0000 mg | ORAL_TABLET | Freq: Every evening | ORAL | Status: DC

## 2011-08-15 NOTE — Progress Notes (Signed)
Pt's LDL is not at goal, despite improvement from last year. Therefore, I would like to escalate her therapy to a more potent statin. I called the pt and informed her of this need for adjusting therapy. She has not previously had adverse side effects with Simvastatin. Therefore, I plan to change her to Simvastatin. LFTs are wnl. I'd like her to return for recheck LFTs in 6-8 weeks.  Chilton Greathouse, D.O.

## 2011-08-29 ENCOUNTER — Other Ambulatory Visit: Payer: Self-pay | Admitting: *Deleted

## 2011-08-29 ENCOUNTER — Other Ambulatory Visit: Payer: Self-pay | Admitting: Internal Medicine

## 2011-08-29 MED ORDER — LISINOPRIL 20 MG PO TABS: 20.0000 mg | ORAL_TABLET | Freq: Every day | ORAL | Status: DC

## 2011-08-29 NOTE — Telephone Encounter (Signed)
Pt no longer on lisinopril -hctz combo pill. She is on lisinopril alone.  Chilton Greathouse, D.O.

## 2011-11-11 ENCOUNTER — Ambulatory Visit (INDEPENDENT_AMBULATORY_CARE_PROVIDER_SITE_OTHER): Payer: Medicare Other | Admitting: Internal Medicine

## 2011-11-11 ENCOUNTER — Encounter: Payer: Self-pay | Admitting: Internal Medicine

## 2011-11-11 VITALS — BP 154/64 | HR 85 | Temp 97.4°F | Ht 67.0 in | Wt 156.7 lb

## 2011-11-11 DIAGNOSIS — E119 Type 2 diabetes mellitus without complications: Secondary | ICD-10-CM

## 2011-11-11 DIAGNOSIS — Z720 Tobacco use: Secondary | ICD-10-CM | POA: Insufficient documentation

## 2011-11-11 DIAGNOSIS — M25552 Pain in left hip: Secondary | ICD-10-CM

## 2011-11-11 DIAGNOSIS — M25559 Pain in unspecified hip: Secondary | ICD-10-CM

## 2011-11-11 DIAGNOSIS — Z79899 Other long term (current) drug therapy: Secondary | ICD-10-CM

## 2011-11-11 DIAGNOSIS — E785 Hyperlipidemia, unspecified: Secondary | ICD-10-CM

## 2011-11-11 DIAGNOSIS — M25551 Pain in right hip: Secondary | ICD-10-CM

## 2011-11-11 DIAGNOSIS — F17200 Nicotine dependence, unspecified, uncomplicated: Secondary | ICD-10-CM

## 2011-11-11 DIAGNOSIS — I10 Essential (primary) hypertension: Secondary | ICD-10-CM

## 2011-11-11 LAB — GLUCOSE, CAPILLARY: Glucose-Capillary: 105 mg/dL — ABNORMAL HIGH (ref 70–99)

## 2011-11-11 LAB — POCT GLYCOSYLATED HEMOGLOBIN (HGB A1C): Hemoglobin A1C: 7.1

## 2011-11-11 MED ORDER — PRAVASTATIN SODIUM 40 MG PO TABS: 40.0000 mg | ORAL_TABLET | Freq: Every evening | ORAL | Status: DC

## 2011-11-11 MED ORDER — TRAMADOL-ACETAMINOPHEN 37.5-325 MG PO TABS: 1.0000 | ORAL_TABLET | Freq: Four times a day (QID) | ORAL | Status: DC | PRN

## 2011-11-11 NOTE — Assessment & Plan Note (Signed)
BP Readings from Last 3 Encounters:  11/11/11 154/64  07/25/11 126/65  02/18/11 [id number]    Basic Metabolic Panel:    Component Value Date/Time   NA 139 07/25/2011 1611   K 4.2 07/25/2011 1611   CL 106 07/25/2011 1611   CO2 20 07/25/2011 1611   BUN 17 07/25/2011 1611   CREATININE 0.84 07/25/2011 1611   CREATININE 0.78 01/16/2010 2237   GLUCOSE 105* 07/25/2011 1611   CALCIUM 8.5 07/25/2011 1611    Assessment: Status:  mild elevation, possibly in setting of hip pain  Disease Control: not controlled  Progress toward goals: deteriorated  Barriers to meeting goals: acutely in mild pain.   Plan:  continue current medications  Reassess next visit for consideration of escalating lisinopril to 40mg  daily.

## 2011-11-11 NOTE — Assessment & Plan Note (Signed)
Possibly bursitis versus arthritis. No symptoms suggestive of lumbar radiculopathy. No specific indication for imaging at this point.   Plan:  Will treat conservatively with her Ultracet.   Pt was offered by declined PT - states she wants to first start walking and exercising on her own.  Instructed to return to clinic or ER if symptoms worsen or new symptoms arise.

## 2011-11-11 NOTE — Assessment & Plan Note (Signed)
1. The patient was counseled on the dangers of tobacco use, and was advised to quit- she wants to do so, but does not want pharmacologic help with this effort.   2. Reviewed strategies to maximize success, including:  Removing cigarettes and smoking materials from environment  Stress management  Substitution of other forms of reinforcement Support of family/friends.  Selecting a quit date.

## 2011-11-11 NOTE — Assessment & Plan Note (Signed)
Liver Function Tests:    Component Value Date/Time   AST 18 07/25/2011 1611   ALT 14 07/25/2011 1611   ALKPHOS 50 07/25/2011 1611   BILITOT 0.2* 07/25/2011 1611   PROT 6.3 07/25/2011 1611   ALBUMIN 3.6 07/25/2011 1611    Lipid Panel:     Component Value Date/Time   CHOL 196 07/25/2011 1611   TRIG 69 07/25/2011 1611   HDL 72 07/25/2011 1611   CHOLHDL 2.7 07/25/2011 1611   VLDL 14 07/25/2011 1611   LDLCALC 110* 07/25/2011 1611     Assessment: Status:  Intolerance to Simvastatin with lightheadedness/ dizziness immediately after taking this medication. She was switched in 07/2011 to obtain a goal < 100, but with this intolerance, and only mild elevation above the 100 LDL goal, I am inclined to let her attempt lifestyle modification.  Disease Control: not controlled  Progress toward goals: unchanged  Barriers to meeting goals: medication intolerance to simvastatin.   Plan:  STOP simvastatin  START pravastatin 40mg  - previously has tolerated well.
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
## 2011-11-11 NOTE — Patient Instructions (Signed)
   Please follow-up at the clinic in 6 months, at which time we will reevaluate diabetes, blood pressure, cholesterol.  There have been changes in your medications:  STOP simvastatin  START pravastatin.  Please follow-up in the clinic sooner if needed.  If you have been started on new medication(s), and you develop symptoms concerning for allergic reaction, including, but not limited to, throat closing, tongue swelling, rash, please stop the medication immediately and call the clinic at [phone], and go to the ER.  If you are diabetic, please bring your meter to your next visit.  If symptoms worsen, or new symptoms arise, please call the clinic or go to the ER.  Please bring all of your medications in a bag to your next visit.

## 2011-11-11 NOTE — Assessment & Plan Note (Signed)
Labs: Lab Results  Component Value Date   HGBA1C 7.1 11/11/2011   HGBA1C 5.9 06/18/2010   CREATININE 0.84 07/25/2011   CREATININE 0.78 01/16/2010   MICROALBUR 13.06* 06/19/2010   MICRALBCREAT 95.7* 06/19/2010   CHOL 196 07/25/2011   HDL 72 07/25/2011   TRIG 69 07/25/2011    Last eye exam and foot exam:    Component Value Date/Time   HMDIABFOOTEX High risk foot with multiple calluses 06/18/2010     Assessment:     Status:  Patient is 78 years old, had prior severe and frequent hypoglycemic episodes just on glipizide alone, and prior intolerances to metformin. I am comfortable with a slightly higher A1c goal for this patient to avoid further hypoglycemia.  Disease Control: controlled  Progress toward goals: at goal  Barriers to meeting goals: no barriers identified   Plan: Glucometer log was not reviewed today, as pt did not have glucometer available for review.      continue lifestyle modification.

## 2011-11-14 ENCOUNTER — Telehealth: Payer: Self-pay | Admitting: Dietician

## 2011-11-15 NOTE — Telephone Encounter (Signed)
Patient said she is out of town and will talk next week when she is back in town. Will call back then.

## 2011-12-04 ENCOUNTER — Encounter: Payer: Self-pay | Admitting: Dietician

## 2011-12-04 NOTE — Telephone Encounter (Signed)
Tried calling patient again today. She was too busy to talk. Will mail letter offering her [place] Quitline and/or smoking cessation counseling.

## 2011-12-25 ENCOUNTER — Encounter (HOSPITAL_COMMUNITY): Payer: Medicare Other

## 2011-12-25 ENCOUNTER — Encounter (HOSPITAL_COMMUNITY)
Admission: AD | Disposition: A | Discharge status: Home / Self Care | Payer: Self-pay | Source: Ambulatory Visit | Attending: Ophthalmology

## 2011-12-25 ENCOUNTER — Encounter (HOSPITAL_COMMUNITY): Payer: Self-pay | Admitting: *Deleted

## 2011-12-25 ENCOUNTER — Other Ambulatory Visit: Payer: Self-pay

## 2011-12-25 ENCOUNTER — Encounter (HOSPITAL_COMMUNITY): Payer: Self-pay | Admitting: Pharmacy Technician

## 2011-12-25 ENCOUNTER — Ambulatory Visit: Admit: 2011-12-25 | Payer: Self-pay | Admitting: Ophthalmology

## 2011-12-25 ENCOUNTER — Ambulatory Visit (HOSPITAL_COMMUNITY)
Admission: AD | Admit: 2011-12-25 | Discharge: 2011-12-25 | Disposition: A | Discharge status: Home / Self Care | Payer: Medicare Other | Source: Ambulatory Visit | Attending: Ophthalmology | Admitting: Ophthalmology

## 2011-12-25 ENCOUNTER — Ambulatory Visit (HOSPITAL_COMMUNITY): Payer: Medicare Other | Admitting: *Deleted

## 2011-12-25 DIAGNOSIS — E119 Type 2 diabetes mellitus without complications: Secondary | ICD-10-CM | POA: Insufficient documentation

## 2011-12-25 DIAGNOSIS — F17200 Nicotine dependence, unspecified, uncomplicated: Secondary | ICD-10-CM | POA: Insufficient documentation

## 2011-12-25 DIAGNOSIS — H2140 Pupillary membranes, unspecified eye: Secondary | ICD-10-CM | POA: Insufficient documentation

## 2011-12-25 DIAGNOSIS — H579 Unspecified disorder of eye and adnexa: Secondary | ICD-10-CM | POA: Insufficient documentation

## 2011-12-25 DIAGNOSIS — K08109 Complete loss of teeth, unspecified cause, unspecified class: Secondary | ICD-10-CM | POA: Insufficient documentation

## 2011-12-25 DIAGNOSIS — Z9849 Cataract extraction status, unspecified eye: Secondary | ICD-10-CM | POA: Insufficient documentation

## 2011-12-25 DIAGNOSIS — I10 Essential (primary) hypertension: Secondary | ICD-10-CM | POA: Insufficient documentation

## 2011-12-25 HISTORY — PX: PARS PLANA VITRECTOMY: SHX2166

## 2011-12-25 LAB — BASIC METABOLIC PANEL
BUN: 15 mg/dL (ref 6–23)
CO2: 24 mEq/L (ref 19–32)
Calcium: 9.6 mg/dL (ref 8.4–10.5)
Chloride: 107 mEq/L (ref 96–112)
Creatinine, Ser: 0.85 mg/dL (ref 0.50–1.10)
GFR calc Af Amer: 73 mL/min — ABNORMAL LOW (ref >90)
GFR calc non Af Amer: 63 mL/min — ABNORMAL LOW (ref >90)
Glucose, Bld: 100 mg/dL — ABNORMAL HIGH (ref 70–99)
Potassium: 4.7 mEq/L (ref 3.5–5.1)
Sodium: 137 mEq/L (ref 135–145)

## 2011-12-25 LAB — SURGICAL PCR SCREEN
MRSA, PCR: NEGATIVE
Staphylococcus aureus: NEGATIVE

## 2011-12-25 LAB — CBC
HCT: 41.1 % (ref 36.0–46.0)
Hemoglobin: 14.2 g/dL (ref 12.0–15.0)
MCH: 32.6 pg (ref 26.0–34.0)
MCHC: 34.5 g/dL (ref 30.0–36.0)
MCV: 94.5 fL (ref 78.0–100.0)
Platelets: 195 10*3/uL (ref 150–400)
RBC: 4.35 MIL/uL (ref 3.87–5.11)
RDW: 13.9 % (ref 11.5–15.5)
WBC: 5.1 10*3/uL (ref 4.0–10.5)

## 2011-12-25 LAB — GLUCOSE, CAPILLARY: Glucose-Capillary: 98 mg/dL (ref 70–99)

## 2011-12-25 SURGERY — PARS PLANA VITRECTOMY 25 GAUGE FOR ENDOPHTHALMITIS
Anesthesia: Monitor Anesthesia Care | Laterality: Left

## 2011-12-25 SURGERY — PARS PLANA VITRECTOMY WITH 25 GAUGE
Anesthesia: Monitor Anesthesia Care | Site: Eye | Laterality: Left | Wound class: Dirty or Infected

## 2011-12-25 MED ORDER — CEFTAZIDIME INTRAVITREAL INJECTION 2.25 MG/0.1 ML
2.2500 mg | INTRAVITREAL | Status: DC
  Filled 2011-12-25: qty 0.1

## 2011-12-25 MED ORDER — GATIFLOXACIN 0.5 % OP SOLN
1.0000 [drp] | OPHTHALMIC | Status: DC | PRN
  Administered 2011-12-25: 1 [drp] via OPHTHALMIC
  Filled 2011-12-25: qty 2.5

## 2011-12-25 MED ORDER — CYCLOPENTOLATE HCL 1 % OP SOLN
1.0000 [drp] | OPHTHALMIC | Status: DC | PRN
  Administered 2011-12-25: 1 [drp] via OPHTHALMIC
  Filled 2011-12-25: qty 2

## 2011-12-25 MED ORDER — MUPIROCIN 2 % EX OINT
TOPICAL_OINTMENT | Freq: Once | CUTANEOUS | Status: AC
  Administered 2011-12-25: 13:00:00 via NASAL

## 2011-12-25 MED ORDER — PHENYLEPHRINE HCL 2.5 % OP SOLN
1.0000 [drp] | OPHTHALMIC | Status: DC | PRN
  Administered 2011-12-25: 1 [drp] via OPHTHALMIC
  Filled 2011-12-25: qty 3

## 2011-12-25 MED ORDER — VANCOMYCIN INTRAVITREAL INJECTION 1 MG/0.1 ML
INTRAOCULAR | Status: DC | PRN
  Administered 2011-12-25: 4 mg via INTRAVITREAL

## 2011-12-25 MED ORDER — PROPOFOL 10 MG/ML IV EMUL
INTRAVENOUS | Status: DC | PRN
  Administered 2011-12-25: 50 mg via INTRAVENOUS

## 2011-12-25 MED ORDER — LIDOCAINE HCL 2 % IJ SOLN
INTRAMUSCULAR | Status: DC | PRN
  Administered 2011-12-25: 10 mL

## 2011-12-25 MED ORDER — ONDANSETRON HCL 4 MG/2ML IJ SOLN: 4.0000 mg | Freq: Four times a day (QID) | INTRAMUSCULAR | Status: DC | PRN

## 2011-12-25 MED ORDER — CEFAZOLIN SODIUM 1-5 GM-% IV SOLN
INTRAVENOUS | Status: AC
  Filled 2011-12-25: qty 50

## 2011-12-25 MED ORDER — BSS IO SOLN
INTRAOCULAR | Status: DC | PRN
  Administered 2011-12-25: 19:00:00

## 2011-12-25 MED ORDER — MUPIROCIN 2 % EX OINT
TOPICAL_OINTMENT | CUTANEOUS | Status: AC
  Filled 2011-12-25: qty 22

## 2011-12-25 MED ORDER — HYPROMELLOSE (GONIOSCOPIC) 2.5 % OP SOLN
OPHTHALMIC | Status: DC | PRN
  Administered 2011-12-25: 1 [drp] via OPHTHALMIC

## 2011-12-25 MED ORDER — VANCOMYCIN 10 MG/ML OPHTHALMIC INJECTION
10.0000 mg | INJECTION | Status: DC
  Filled 2011-12-25: qty 1

## 2011-12-25 MED ORDER — ONDANSETRON HCL 4 MG/2ML IJ SOLN
INTRAMUSCULAR | Status: DC | PRN
  Administered 2011-12-25: 4 mg via INTRAVENOUS

## 2011-12-25 MED ORDER — SODIUM CHLORIDE 0.9 % IV SOLN
INTRAVENOUS | Status: DC | PRN
  Administered 2011-12-25: 17:00:00 via INTRAVENOUS

## 2011-12-25 MED ORDER — HYDROMORPHONE HCL PF 1 MG/ML IJ SOLN: 0.2500 mg | INTRAMUSCULAR | Status: DC | PRN

## 2011-12-25 MED ORDER — CEFTAZIDIME INTRAVITREAL INJECTION 2.25 MG/0.1 ML
INTRAVITREAL | Status: DC | PRN
  Administered 2011-12-25: 9 mg via INTRAVITREAL

## 2011-12-25 MED ORDER — ACETAMINOPHEN 325 MG PO TABS: 325.0000 mg | ORAL_TABLET | ORAL | Status: DC | PRN

## 2011-12-25 MED ORDER — DEXAMETHASONE SODIUM PHOSPHATE 10 MG/ML IJ SOLN
INTRAMUSCULAR | Status: DC | PRN
  Administered 2011-12-25: 10 mg via INTRAVENOUS

## 2011-12-25 SURGICAL SUPPLY — 58 items
ACCESSORY FRAGMATOME (MISCELLANEOUS) IMPLANT
APL SRG 3 HI ABS STRL LF PLS (MISCELLANEOUS)
APPLICATOR COTTON TIP 6IN STRL (MISCELLANEOUS) ×2 IMPLANT
APPLICATOR DR MATTHEWS STRL (MISCELLANEOUS) IMPLANT
CANNULA ANT CHAM MAIN (OPHTHALMIC RELATED) IMPLANT
CANNULA FLEX TIP 25G (CANNULA) IMPLANT
CLOTH BEACON ORANGE TIMEOUT ST (SAFETY) ×2 IMPLANT
CORDS BIPOLAR (ELECTRODE) IMPLANT
DRAPE OPHTHALMIC 77X100 STRL (CUSTOM PROCEDURE TRAY) ×2 IMPLANT
FILTER BLUE MILLIPORE (MISCELLANEOUS) IMPLANT
FORCEPS ECKARDT ILM 25G SERR (OPHTHALMIC RELATED) IMPLANT
FORCEPS HORIZONTAL 25G DISP (OPHTHALMIC RELATED) IMPLANT
GAS OPHTHALMIC (MISCELLANEOUS) IMPLANT
GLOVE SS BIOGEL STRL SZ 8 (GLOVE) ×1 IMPLANT
GLOVE SUPERSENSE BIOGEL SZ 8 (GLOVE) ×1
GOWN STRL NON-REIN LRG LVL3 (GOWN DISPOSABLE) ×2 IMPLANT
ILLUMINATOR ENDO 25GA (MISCELLANEOUS) ×2 IMPLANT
KIT BASIN OR (CUSTOM PROCEDURE TRAY) ×2 IMPLANT
KIT PERFLUORON PROCEDURE 5ML (MISCELLANEOUS) IMPLANT
KIT ROOM TURNOVER OR (KITS) ×2 IMPLANT
KNIFE CRESCENT 2.5 55 ANG (BLADE) IMPLANT
LENS BIOM SUPER VIEW SET DISP (OPHTHALMIC RELATED) ×2 IMPLANT
MARKER SKIN DUAL TIP RULER LAB (MISCELLANEOUS) IMPLANT
MASK EYE SHIELD (GAUZE/BANDAGES/DRESSINGS) ×1 IMPLANT
MICROPICK 25G (MISCELLANEOUS)
NDL 18GX1X1/2 (RX/OR ONLY) (NEEDLE) IMPLANT
NDL 25GX 5/8IN NON SAFETY (NEEDLE) IMPLANT
NDL FILTER BLUNT 18X1 1/2 (NEEDLE) IMPLANT
NDL HYPO 25GX1X1/2 BEV (NEEDLE) IMPLANT
NEEDLE 18GX1X1/2 (RX/OR ONLY) (NEEDLE) IMPLANT
NEEDLE 25GX 5/8IN NON SAFETY (NEEDLE) ×4 IMPLANT
NEEDLE FILTER BLUNT 18X 1/2SAF (NEEDLE)
NEEDLE FILTER BLUNT 18X1 1/2 (NEEDLE) IMPLANT
NEEDLE HYPO 25GX1X1/2 BEV (NEEDLE) IMPLANT
NS IRRIG 1000ML POUR BTL (IV SOLUTION) ×2 IMPLANT
PACK VITRECTOMY CUSTOM (CUSTOM PROCEDURE TRAY) ×2 IMPLANT
PAD ARMBOARD 7.5X6 YLW CONV (MISCELLANEOUS) ×4 IMPLANT
PAD EYE OVAL STERILE LF (GAUZE/BANDAGES/DRESSINGS) ×1 IMPLANT
PAK VITRECTOMY PIK 25 GA (OPHTHALMIC RELATED) ×2 IMPLANT
PENCIL BIPOLAR 25GA STR DISP (OPHTHALMIC RELATED) IMPLANT
PICK MICROPICK 25G (MISCELLANEOUS) IMPLANT
PROBE DIRECTIONAL LASER (MISCELLANEOUS) IMPLANT
ROLLS DENTAL (MISCELLANEOUS) IMPLANT
SCRAPER DIAMOND 25GA (OPHTHALMIC RELATED) IMPLANT
SET FLUID INJECTOR (SET/KITS/TRAYS/PACK) IMPLANT
STOCKINETTE IMPERVIOUS 9X36 MD (GAUZE/BANDAGES/DRESSINGS) ×4 IMPLANT
STOPCOCK 4 WAY LG BORE MALE ST (IV SETS) IMPLANT
SUT ETHILON 10 0 CS140 6 (SUTURE) IMPLANT
SUT ETHILON 8 0 BV130 4 (SUTURE) IMPLANT
SUT MERSILENE 5 0 RD 1 DA (SUTURE) IMPLANT
SUT PROLENE 10 0 CIF 4 DA (SUTURE) IMPLANT
SUT VICRYL 7 0 TG140 8 (SUTURE) IMPLANT
SYR 30ML SLIP (SYRINGE) IMPLANT
SYR 5ML LL (SYRINGE) IMPLANT
SYR TB 1ML LUER SLIP (SYRINGE) ×3 IMPLANT
TOWEL OR 17X24 6PK STRL BLUE (TOWEL DISPOSABLE) ×4 IMPLANT
WATER STERILE IRR 1000ML POUR (IV SOLUTION) ×2 IMPLANT
WIPE INSTRUMENT VISIWIPE 73X73 (MISCELLANEOUS) ×2 IMPLANT

## 2011-12-25 NOTE — Brief Op Note (Signed)
12/25/2011  6:14 PM  PATIENT:  Gina [name]  78 y.o. 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   POST-OPERATIVE DIAGNOSIS:  Endophthalmitis, suspected left eye    PROCEDURE:  Procedure(s) (LRB): PARS PLANA VITRECTOMY WITH 25 GAUGE (Left), with removal of pupillary iris membrane , diagnostic anterior chamber tap, and with instillation of intravitreal antibiotics, Vancomycin 1.0 mgm0.1 cc, and fortaz 2.25 mgm0.1 cc, each intravitreally,  SURGEON:  Surgeon(s) and Role:    * Hurman Horn, MD - Primary  PHYSICIAN ASSISTANT:   ASSISTANTS: none   ANESTHESIA:   MAC, with local xylocaine 2%, retrobulbar  EBL:  Total I/O In: 300 [I.V.:300] Out: -   BLOOD ADMINISTERED:none  DRAINS: none   LOCAL MEDICATIONS USED:  BUPIVICAINE  and XYLOCAINE   SPECIMEN:  Source of Specimen:  anterior chamber, pupil fibrin and Aspirate  DISPOSITION OF SPECIMEN:  MICROBIOLOLY LAB, 2 AGAR, 1 TRANSPORT  COUNTS:  YES  TOURNIQUET:  * No tourniquets in log *  DICTATION: .Other Dictation: Dictation Number [id number]  PLAN OF CARE: Discharge to home after PACU  PATIENT DISPOSITION:  PACU - hemodynamically stable.   Delay start of Pharmacological VTE agent (>24hrs) due to surgical blood loss or risk of bleeding:N/A

## 2011-12-25 NOTE — Transfer of Care (Signed)
Immediate Anesthesia Transfer of Care Note  Patient: Gina [name]  Procedure(s) Performed: Procedure(s) (LRB): PARS PLANA VITRECTOMY WITH 25 GAUGE (Left)  Patient Location: PACU  Anesthesia Type: MAC  Level of Consciousness: awake and patient cooperative  Airway & Oxygen Therapy: Patient Spontanous Breathing  Post-op Assessment: Report given to PACU RN and Post -op Vital signs reviewed and stable  Post vital signs: Reviewed and stable  Complications: No apparent anesthesia complications

## 2011-12-25 NOTE — Anesthesia Postprocedure Evaluation (Signed)
Anesthesia Post Note  Patient: Gina [name]  Procedure(s) Performed: Procedure(s) (LRB): PARS PLANA VITRECTOMY WITH 25 GAUGE (Left)  Anesthesia type: MAC  Patient location: PACU  Post pain: Pain level controlled  Post assessment: Patient's Cardiovascular Status Stable  Last Vitals:  Filed Vitals:   12/25/11 1314  BP: 180/60  Pulse:   Temp:   Resp:     Post vital signs: Reviewed and stable  Level of consciousness: alert  Complications: No apparent anesthesia complications
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
## 2011-12-25 NOTE — OR Nursing (Signed)
Discussed sbp of ~200 with dr Albertina Parr and aware baseline ~180 ---> no Rx per him

## 2011-12-25 NOTE — Preoperative (Signed)
Beta Blockers   Reason not to administer Beta Blockers:Not Applicable 

## 2011-12-25 NOTE — Discharge Instructions (Signed)
KEEP PATCH ON UNTIL OFFICE VISIT,  TOMORROW. CALL DR Dahlia Bailiff OFFICE IF ANY PROBLEMS,QUESTIONS, OR CONCERNS

## 2011-12-25 NOTE — Progress Notes (Signed)
Eye drops x 3 completed

## 2011-12-25 NOTE — Progress Notes (Signed)
Confirmed with Dr. Zadie Rhine eye drops are every 5 minutes times 3 doses.

## 2011-12-25 NOTE — Anesthesia Preprocedure Evaluation (Addendum)
Anesthesia Evaluation  Patient identified by MRN, date of birth, ID band Patient awake    Reviewed: Allergy & Precautions, H&P , NPO status , Patient's Chart, lab work & pertinent test results  Airway Mallampati: II TM Distance: >3 FB     Dental  (+) Edentulous Upper and Edentulous Lower   Pulmonary Current Smoker,          Cardiovascular hypertension, Pt. on medications     Neuro/Psych    GI/Hepatic   Endo/Other  Diabetes mellitus-, Type 2  Renal/GU      Musculoskeletal   Abdominal   Peds  Hematology   Anesthesia Other Findings   Reproductive/Obstetrics                         Anesthesia Physical Anesthesia Plan  ASA: III  Anesthesia Plan: MAC   Post-op Pain Management:    Induction: Intravenous  Airway Management Planned: Nasal Cannula  Additional Equipment:   Intra-op Plan:   Post-operative Plan:   Informed Consent: I have reviewed the patients History and Physical, chart, labs and discussed the procedure including the risks, benefits and alternatives for the proposed anesthesia with the patient or authorized representative who has indicated his/her understanding and acceptance.     Plan Discussed with: Anesthesiologist  Anesthesia Plan Comments:         Anesthesia Quick Evaluation

## 2011-12-26 ENCOUNTER — Encounter (HOSPITAL_COMMUNITY): Payer: Self-pay | Admitting: Ophthalmology

## 2011-12-26 NOTE — Op Note (Signed)
NAMEMAKARIA, DEMAIN NO.:  [id number]  MEDICAL RECORD NO.:  [id number]  LOCATION:  [facility]                         FACILITY:  [facility]  PHYSICIAN:  Dominica Severin A. Shontez Sermon, M.D.   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  POSTOPERATIVE DIAGNOSES: 1. 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 2. Pupillary fibrin membrane.  SURGEON:  Clent Demark. Temiloluwa Laredo, M.D.  ANESTHESIA:  Local retrobulbar monitored anesthesia control.  PROCEDURE: 1. Posterior vitrectomy, left eye. 2. Removal of pupillary fibrin membrane. 3. Insulation of intravitreal antibiotics of ceftazidime 2.25 mg/0.1     mL, volume 0.1 mL into the vitreous cavity as well as vancomycin     1.0 mg/0.1 mL concentration with a volume of 0.1 mL.  INDICATION FOR PROCEDURE:  The patient is an 78 year old woman who had straightforward cataract surgery 2 days previously but did have some requirement of iris hooks at the time of surgery, which can trigger intraocular inflammation.  Patient understands this is an attempt to diagnostically determine if there is endophthalmitis in this left eye, but more importantly clear the intraocular inflammation and deliver therapeutic intravitreal antibiotics to prevent progression of inflammation if indeed this is from endophthalmitis.  She understands risks of anesthesia, rare occurrence of death, but also to the eye including but not limited to hemorrhage, infection, scarring, need for further surgery, change in vision, loss of vision, progressive disease despite intervention.  After appropriate signed consent was obtained, the patient was taken to the operative room.  In the  operating room, appropriate monitors followed by mild sedation.  Appropriate site selection was confirmed from the operating staff thereafter under mild sedation, 2% Xylocaine 5 mL injected retrobulbar with additional 5 mL laterally in fashion of modified Kirk Ruths.  The left periocular region was prepped and draped in the usual sterile ophthalmic fashion.  A lid speculum  was applied.  The anterior chamber was inspected and I decided to place the posterior infusion 25-gauge into the anterior chamber in the inferonasal quadrant. This will keep the anterior chamber form while performing anterior chamber aspirate as well as removal of pupillary fibrin membrane visible.  Using a small paracentesis incision with a 25-gauge needle, an aspirate was obtained from the anterior chamber and placed immediately and directly On the sheet blader auger and the chopper blade auger as well as transfer medium for culturing.  Thereafter through the same 25- gauge entry site, 25-gauge forceps then used to engage pupillary fibrin membrane and using a noodle rolling-type technique was able to spiral the fibrin membrane off the pupil and off the anterior surface of the IOL.  The extension of this fibrin into the anterior chamber was significant and also posterior chamber.  Visual axis cleared significantly.  This time, the superior 25-gauge trocar was placed to the pars plana and then a limited vitrectomy was then carried out posteriorly.  Limited vitrectomy was completed and no attempt was  made to vitreous base shaved significantly.  The vitreous and the visual axis were clear at this time.  At this time, the superior trocar was removed from the eye.  The intraocular pressures thus found to be adequately low, and this allowed for delivery of 0.1 mL of vancomycin with appropriate concentration and 0.1 mL of Fortaz reinjected into the vitreous cavity.  Remnants of these were then placed subconjunctival away from  the sclerotomy sites.  The effusion had been removed to allow the globe soften prior to these injections.  Now, a small amount of Decadron was placed superonasally away from the antibiotics.  The eye was much quieter looking.  A sterile patch and Fox shield were applied.  Patient was sent to PACU in good and stable condition.     Clent Demark Joelly Bolanos, M.D.     GAR/MEDQ  D:  12/25/2011  T:  12/26/2011  Job:  [id number]  cc:   Herbie Baltimore L. Katy Fitch, M.D.

## 2012-01-02 LAB — EYE CULTURE: Culture: NO GROWTH

## 2012-02-04 ENCOUNTER — Ambulatory Visit: Payer: Medicare Other | Admitting: Neurosurgery

## 2012-02-05 ENCOUNTER — Encounter: Payer: Self-pay | Admitting: Neurosurgery

## 2012-02-06 ENCOUNTER — Ambulatory Visit (INDEPENDENT_AMBULATORY_CARE_PROVIDER_SITE_OTHER): Payer: Medicare Other | Admitting: Neurosurgery

## 2012-02-06 ENCOUNTER — Ambulatory Visit (INDEPENDENT_AMBULATORY_CARE_PROVIDER_SITE_OTHER): Payer: Medicare Other | Admitting: *Deleted

## 2012-02-06 ENCOUNTER — Encounter (INDEPENDENT_AMBULATORY_CARE_PROVIDER_SITE_OTHER): Payer: Medicare Other | Admitting: *Deleted

## 2012-02-06 ENCOUNTER — Encounter: Payer: Self-pay | Admitting: Neurosurgery

## 2012-02-06 VITALS — BP 130/70 | HR 60 | Resp 16 | Ht 66.0 in | Wt 150.4 lb

## 2012-02-06 DIAGNOSIS — Z48812 Encounter for surgical aftercare following surgery on the circulatory system: Secondary | ICD-10-CM

## 2012-02-06 DIAGNOSIS — I739 Peripheral vascular disease, unspecified: Secondary | ICD-10-CM

## 2012-02-06 NOTE — Progress Notes (Signed)
[facility] HISTORY AND PHYSICAL   CC: Annual lower extremity bypass graft scan and ABI Referring Physician: Kellie Simmering  History of Present Illness: 78 year old female patient of Dr. Kellie Simmering as long history with him. Patient has lower extremity bypass graft bilaterally that was done 2004 with Dr. Kellie Simmering. She's done well. She has no signs or symptoms claudication she has no rest pain she has no open wounds on her lower extremities.  Past Medical History  Diagnosis Date  . Intraductal carcinoma 06/2003    Of right breast. s/p right partial mastectomy. // Followed by Dr. Marylene Buerger  . PVD (peripheral vascular disease)     S/P BL femoral-popliteal bypass surgery 03/2001 (right) and 01/2003 (left)  . Diabetes mellitus, type 2     Well controlled  . Hypertension   . Hyperlipidemia   . TB lung, latent     Treated with INH in 11/2007    ROS: [x]  Positive   [ ]  Denies    General: [ ]  Weight loss, [ ]  Fever, [ ]  chills Neurologic: [ ]  Dizziness, [ ]  Blackouts, [ ]  Seizure [ ]  Stroke, [ ]  "Mini stroke", [ ]  Slurred speech, [ ]  Temporary blindness; [ ]  weakness in arms or legs, [ ]  Hoarseness Cardiac: [ ]  Chest pain/pressure, [ ]  Shortness of breath at rest [ ]  Shortness of breath with exertion, [ ]  Atrial fibrillation or irregular heartbeat Vascular: [ ]  Pain in legs with walking, [ ]  Pain in legs at rest, [ ]  Pain in legs at night,  [ ]  Non-healing ulcer, [ ]  Blood clot in vein/DVT,   Pulmonary: [ ]  Home oxygen, [ ]  Productive cough, [ ]  Coughing up blood, [ ]  Asthma,  [ ]  Wheezing Musculoskeletal:  [ ]  Arthritis, [ ]  Low back pain, [ ]  Joint pain Hematologic: [ ]  Easy Bruising, [ ]  Anemia; [ ]  Hepatitis Gastrointestinal: [ ]  Blood in stool, [ ]  Gastroesophageal Reflux/heartburn, [ ]  Trouble swallowing Urinary: [ ]  chronic Kidney disease, [ ]  on HD - [ ]  MWF or [ ]  TTHS, [ ]  Burning with urination, [ ]  Difficulty urinating Skin: [ ]  Rashes, [ ]   Wounds Psychological: [ ]  Anxiety, [ ]  Depression   Social History History  Substance Use Topics  . Smoking status: Current Everyday Smoker -- 0.5 packs/day for 64 years    Types: Cigarettes  . Smokeless tobacco: Never Used   Comment: previously smoked 1 ppd x 64 years, now is at 0.5 ppd x 6 months.  . Alcohol Use: No    Family History Family History  Problem Relation Age of Onset  . Cancer Daughter 67    uterine cancer  . Cancer Father     Allergies  Allergen Reactions  . Metformin And Related Nausea Only    And diarrhea    Current Outpatient Prescriptions  Medication Sig Dispense Refill  . pravastatin (PRAVACHOL) 40 MG tablet Take 40 mg by mouth every evening.      . prednisoLONE acetate (PRED FORTE) 1 % ophthalmic suspension Place 1 drop into the right eye Every 2 hours.      . traMADol-acetaminophen (ULTRACET) 37.5-325 MG per tablet Take 1 tablet by mouth every 6 (six) hours as needed. For pain      . lisinopril (PRINIVIL,ZESTRIL) 20 MG tablet Take 20 mg by mouth daily.      . pravastatin (PRAVACHOL) 40 MG tablet Take 1 tablet (40 mg total) by mouth daily.  30 tablet  5    Physical Examination  Filed Vitals:   02/06/12 1109  BP: 130/70  Pulse: 60  Resp: 16    Body mass index is 24.28 kg/(m^2).  General:  WDWN in NAD Gait: Normal HEENT: WNL Eyes: Pupils equal Pulmonary: normal non-labored breathing , without Rales, rhonchi,  wheezing Cardiac: RRR, without  Murmurs, rubs or gallops; No carotid bruits Abdomen: soft, NT, no masses Skin: no rashes, ulcers noted Vascular Exam/Pulses: Patient does have palpable PT and DP pulses bilaterally 2+ femoral pulses bilaterally  Extremities without ischemic changes, no Gangrene , no cellulitis; no open wounds;  Musculoskeletal: no muscle wasting or atrophy  Neurologic: A&O X 3; Appropriate Affect ; SENSATION: normal; MOTOR FUNCTION:  moving all extremities equally. Speech is fluent/normal  Non-Invasive Vascular  Imaging: Lower extremity  bypass graft scan today shows widely patent grafts. Her ABI today is 0.98 on the right and monophasic to biphasic, 1.06 on the left with monophasic to biphasic flow  ASSESSMENT/PLAN: Assessment as above, plan will be for the patient return in one year with repeat ABIs and lower extremity bypass graft scan is. Her questions were encouraged and answered she is in agreement with this plan.  Beatris Ship ANP  Clinic M.D.: [place]

## 2012-02-07 NOTE — Progress Notes (Signed)
Addended by: Mena Goes on: 02/07/2012 10:18 AM   Modules accepted: Orders

## 2012-02-12 NOTE — Procedures (Unsigned)
BYPASS GRAFT EVALUATION  INDICATION:  Followup bilateral lower extremity bypass grafts  HISTORY: Diabetes:  Yes Cardiac:  No Hypertension:  Yes Smoking:  Yes Previous Surgery:  Right femoral to popliteal bypass graft with Gore-Tex 04/21/2011.  Left femoral to popliteal Gore-Tex bypass graft 02/11/2003.  SINGLE LEVEL ARTERIAL EXAM                              RIGHT              LEFT Brachial: Anterior tibial: Posterior tibial: Peroneal: Ankle/brachial index:        0.98               1.06  PREVIOUS ABI:  Date:  02/04/2011  RIGHT:  0.93  LEFT:  0.92  LOWER EXTREMITY BYPASS GRAFT DUPLEX EXAM:  DUPLEX:  Widely patent right and left lower extremity femoral to popliteal bypass grafts with biphasic waveforms noted throughout.  IMPRESSION: 1. Widely patent right and left lower extremity femoral to popliteal     bypass grafts with velocity measurements shown on the following     worksheet. 2. Bilateral ankle brachial indices are within normal limits and     slightly improved compared to the last exam.  ___________________________________________ Nelda Severe. Kellie Simmering, M.D.  EM/MEDQ  D:  02/07/2012  T:  02/07/2012  Job:  [id number]

## 2012-04-16 ENCOUNTER — Encounter: Payer: Self-pay | Admitting: Internal Medicine

## 2012-04-16 ENCOUNTER — Ambulatory Visit (INDEPENDENT_AMBULATORY_CARE_PROVIDER_SITE_OTHER): Payer: PRIVATE HEALTH INSURANCE | Admitting: Internal Medicine

## 2012-04-16 VITALS — BP 187/65 | HR 65 | Temp 97.1°F | Ht 67.0 in | Wt 147.5 lb

## 2012-04-16 DIAGNOSIS — E119 Type 2 diabetes mellitus without complications: Secondary | ICD-10-CM

## 2012-04-16 DIAGNOSIS — Z720 Tobacco use: Secondary | ICD-10-CM

## 2012-04-16 DIAGNOSIS — F17200 Nicotine dependence, unspecified, uncomplicated: Secondary | ICD-10-CM

## 2012-04-16 DIAGNOSIS — I10 Essential (primary) hypertension: Secondary | ICD-10-CM

## 2012-04-16 DIAGNOSIS — M25559 Pain in unspecified hip: Secondary | ICD-10-CM

## 2012-04-16 DIAGNOSIS — E785 Hyperlipidemia, unspecified: Secondary | ICD-10-CM

## 2012-04-16 DIAGNOSIS — Z0000 Encounter for general adult medical examination without abnormal findings: Secondary | ICD-10-CM

## 2012-04-16 DIAGNOSIS — Z79899 Other long term (current) drug therapy: Secondary | ICD-10-CM

## 2012-04-16 DIAGNOSIS — M25551 Pain in right hip: Secondary | ICD-10-CM

## 2012-04-16 LAB — LIPID PANEL
Cholesterol: 249 mg/dL — ABNORMAL HIGH (ref 0–200)
HDL: 60 mg/dL (ref >39)
LDL Cholesterol: 168 mg/dL — ABNORMAL HIGH (ref 0–99)
Total CHOL/HDL Ratio: 4.2 Ratio
Triglycerides: 103 mg/dL (ref <150)
VLDL: 21 mg/dL (ref 0–40)

## 2012-04-16 LAB — POCT GLYCOSYLATED HEMOGLOBIN (HGB A1C): Hemoglobin A1C: 6.5

## 2012-04-16 LAB — GLUCOSE, CAPILLARY: Glucose-Capillary: 100 mg/dL — ABNORMAL HIGH (ref 70–99)

## 2012-04-16 MED ORDER — TRAMADOL-ACETAMINOPHEN 37.5-325 MG PO TABS: 1.0000 | ORAL_TABLET | Freq: Four times a day (QID) | ORAL | Status: DC | PRN

## 2012-04-16 MED ORDER — LISINOPRIL 40 MG PO TABS: 40.0000 mg | ORAL_TABLET | Freq: Every day | ORAL | Status: DC

## 2012-04-16 NOTE — Patient Instructions (Addendum)
Please follow-up at the clinic in 3 months, at which time we will reevaluate diabetes, blood pressure - OR, please follow-up in the clinic sooner if needed.  There have been changes in your medications:  INCREASE your lisinopril to 40mg  daily - I have sent in a new prescription for you.    If you have been started on new medication(s), and you develop symptoms concerning for allergic reaction, including, but not limited to, throat closing, tongue swelling, rash, please stop the medication immediately and call the clinic at [phone], and go to the ER.  If you are diabetic, please bring your meter to your next visit.  If symptoms worsen, or new symptoms arise, please call the clinic or go to the ER.  Please bring all of your medications in a bag to your next visit.   Smoking Cessation Tips 1-800-QUIT-NOW  This document explains the best ways for you to quit smoking and new treatments to help. It lists new medicines that can double or triple your chances of quitting and quitting for good. It also considers ways to avoid relapses and concerns you may have about quitting, including weight gain.   NICOTINE: A POWERFUL ADDICTION:  If you have tried to quit smoking, you know how hard it can be. It is hard because nicotine is a very addictive drug. For some people, it can be as addictive as heroin or cocaine. Usually, people make 2 or 3 tries, or more, before finally being able to quit. Each time you try to quit, you can learn about what helps and what hurts. Quitting takes hard work and a lot of effort, but you can quit smoking.   QUITTING SMOKING IS ONE OF THE MOST IMPORTANT THINGS YOU WILL EVER DO:  You will live longer, feel better, and live better.  The impact on your body of quitting smoking is felt almost immediately:  Within 20 minutes, blood pressure decreases. Pulse returns to its normal level.  After 8 hours, carbon monoxide levels in the blood return to normal. Oxygen level  increases.  After 24 hours, chance of heart attack starts to decrease. Breath, hair, and body stop smelling like smoke.  After 48 hours, damaged nerve endings begin to recover. Sense of taste and smell improve.  After 72 hours, the body is virtually free of nicotine. Bronchial tubes relax and breathing becomes easier.  After 2 to 12 weeks, lungs can hold more air. Exercise becomes easier and circulation improves.  Quitting will lower your chance of having a heart attack, stroke, cancer, or lung disease:  After 1 year, the risk of coronary heart disease is cut in half.  After 5 years, the risk of stroke falls to the same as a nonsmoker.  After 10 years, the risk of lung cancer is cut in half and the risk of other cancers decreases significantly.  After 15 years, the risk of coronary heart disease drops, usually to the level of a nonsmoker.  If you are pregnant, quitting smoking will improve your chances of having a healthy baby.  The people you live with, especially your children, will be healthier.  You will have extra money to spend on things other than cigarettes.   FIVE KEYS TO QUITTING:  Studies have shown that these 5 steps will help you quit smoking and quit for good. You have the best chances of quitting if you use them together:   1. GET READY  Set a quit date.  Change your environment.  Get  rid of ALL cigarettes, ashtrays, matches, and lighters in your home, car, and place of work.  Do not let people smoke in your home.  Review your past attempts to quit. Think about what worked and what did not.  Once you quit, do not smoke. NOT EVEN A PUFF!   2. GET SUPPORT AND ENCOURAGEMENT  Studies have shown that you have a better chance of being successful if you have help. You can get support in many ways. Tell your family, friends, and coworkers that you are going to quit and need their support. Ask them not to smoke around you.  Talk to your caregivers (doctor, dentist, nurse,  pharmacist, psychologist, and/or smoking counselor).  Get individual, group, or telephone counseling and support. The more counseling you have, the better your chances are of quitting. Programs are available at [employer] and health centers. Call your local health department for information about programs in your area.  Spiritual beliefs and practices may help some smokers quit.  Quit meters are Insurance underwriter that keep track of quit statistics, such as amount of "quit-time," cigarettes not smoked, and money saved.  Many smokers find one or more of the many self-help books available useful in helping them quit and stay off tobacco.   3. LEARN NEW SKILLS AND BEHAVIORS  Try to distract yourself from urges to smoke. Talk to someone, go for a walk, or occupy your time with a task.  When you first try to quit, change your routine. Take a different route to work. Drink tea instead of coffee. Eat breakfast in a different place.  Do something to reduce your stress. Take a hot bath, exercise, or read a book.  Plan something enjoyable to do every day. Reward yourself for not smoking.  Explore interactive web-based programs that specialize in helping you quit.   4. GET MEDICINE AND USE IT CORRECTLY .  Medicines can help you stop smoking and decrease the urge to smoke. Combining medicine with the above behavioral methods and support can quadruple your chances of successfully quitting smoking.  Talk with your doctor about these options.  5. BE PREPARED FOR RELAPSE OR DIFFICULT SITUATIONS  Most relapses occur within the first 3 months after quitting. Do not be discouraged if you start smoking again. Remember, most people try several times before they finally quit.  You may have symptoms of withdrawal because your body is used to nicotine. You may crave cigarettes, be irritable, feel very hungry, cough often, get headaches, or have difficulty concentrating.  The withdrawal  symptoms are only temporary. They are strongest when you first quit, but they will go away within 10 to 14 days.    Here are some difficult situations to watch for:  Alcohol. Avoid drinking alcohol. Drinking lowers your chances of successfully quitting.  Caffeine. Try to reduce the amount of caffeine you consume. It also lowers your chances of successfully quitting.  Other smokers. Being around smoking can make you want to smoke. Avoid smokers.  Weight gain. Many smokers will gain weight when they quit, usually less than 10 pounds. Eat a healthy diet and stay active. Do not let weight gain distract you from your main goal, quitting smoking. Some medicines that help you quit smoking may also help delay weight gain. You can always lose the weight gained after you quit.  Bad mood or depression. There are a lot of ways to improve your mood other than smoking.   If you are having  problems with any of these situations, talk to your caregiver.   SPECIAL SITUATIONS OR CONDITIONS:  Studies suggest that everyone can quit smoking. Your situation or condition can give you a special reason to quit. Pregnant women/New mothers: By quitting, you protect your baby's health and your own.  Hospitalized patients: By quitting, you reduce health problems and help healing.  Heart attack patients: By quitting, you reduce your risk of a second heart attack.  Lung, head, and neck cancer patients: By quitting, you reduce your chance of a second cancer.  Parents of children and adolescents: By quitting, you protect your children from illnesses caused by secondhand smoke.   QUESTIONS TO THINK ABOUT: Think about the following questions before you try to stop smoking. You may want to talk about your answers with your caregiver.  Why do you want to quit?  If you tried to quit in the past, what helped and what did not?  What will be the most difficult situations for you after you quit? How will you plan to handle them?  Who  can help you through the tough times? Your family? Friends? Caregiver?  What pleasures do you get from smoking? What ways can you still get pleasure if you quit?   Here are some questions to ask your caregiver:  How can you help me to be successful at quitting?  What medicine do you think would be best for me and how should I take it?  What should I do if I need more help?  What is smoking withdrawal like? How can I get information on withdrawal?   Quitting takes hard work and a lot of effort, but you can quit smoking.   FOR MORE INFORMATION  Smokefree.gov (Inrails.tn) provides free, accurate, evidence-based information and professional assistance to help support the immediate and long-term needs of people trying to quit smoking.  Document Released: 09/10/2001 Document Re-Released: 03/06/2010  [facility] Patient Information 2011 [place].

## 2012-04-16 NOTE — Assessment & Plan Note (Addendum)
1. The patient is slowly decreasing her tobacco use from 1/2 ppd --> 1/3 ppd! 2. The patient was counseled on the dangers of tobacco use, and was advised to quit, referred to a tobacco cessation program and willing to consider smoking cessation, particularly if 1-800-quit-now will be able to help with cost of smoking cessation materials.   3. Reviewed strategies to maximize success, including:  Removing cigarettes and smoking materials from environment  Stress management  Substitution of other forms of reinforcement Support of family/friends.  Selecting a quit date.  Patient provided contact information for 1-800-QUIT-NOW

## 2012-04-16 NOTE — Assessment & Plan Note (Signed)
Health Maintenance  Topic Date Due  . Influenza Vaccine  06/30/2012  . Hemoglobin A1c  07/17/2012  . Lipid Panel  07/24/2012  . Mammogram  08/04/2012  . Ophthalmology Exam  08/19/2012  . Foot Exam  04/16/2013  . Colonoscopy  01/10/2014  . Tetanus/tdap  09[birth year]/1950  . Pneumococcal Polysaccharide Vaccine Age 76 And Over  Addressed  . Zostavax  Addressed    Assessment:  Refuses all shots because friend had bad reaction - was explained the importance and purpose of vaccination, however the pt continues to decline.   Foot exam today.  Plan:  Foot exam today.  Keep discussing vaccinations each visit.      Will request eye exam records from Dr. Katy Fitch.  Refuses PCV/ influenza/ VZV shot - states will never get them   Plan foot exam next visit.

## 2012-04-16 NOTE — Assessment & Plan Note (Signed)
Pertinent Labs: Liver Function Tests:    Component Value Date/Time   AST 18 07/25/2011 1611   ALT 14 07/25/2011 1611   ALKPHOS 50 07/25/2011 1611   BILITOT 0.2* 07/25/2011 1611   PROT 6.3 07/25/2011 1611   ALBUMIN 3.6 07/25/2011 1611    Lipid Panel:     Component Value Date/Time   CHOL 196 07/25/2011 1611   TRIG 69 07/25/2011 1611   HDL 72 07/25/2011 1611   CHOLHDL 2.7 07/25/2011 1611   VLDL 14 07/25/2011 1611   LDLCALC 110* 07/25/2011 1611     Assessment: Disease Control: not controlled  Progress toward goals: unable to assess  Barriers to meeting goals: no barriers identified    The patient has previously had intolerance to simvastatin, specifically experiencing dizziness.  She was changed to pravastatin in October 2012, at which time she was being noncompliant with her simvastatin despite which, she had an LDL of only 110 mg/dL. Therefore, the decision was decided to restart pravastatin, which the patient previously tolerated well. She was unable to afford Lipitor at that time.   Patient is fasting today.   Patient is compliant most of the time with prescribed medications.    Plan:  continue current medications  Check lipid panel today, adjust regimen as indicated.

## 2012-04-16 NOTE — Assessment & Plan Note (Signed)
Pertinent Labs: Lab Results  Component Value Date   HGBA1C 6.5 04/16/2012   HGBA1C 5.9 06/18/2010   CREATININE 0.85 12/25/2011   CREATININE 0.84 07/25/2011   MICROALBUR 13.06* 06/19/2010   MICRALBCREAT 95.7* 06/19/2010   CHOL 196 07/25/2011   HDL 72 07/25/2011   TRIG 69 07/25/2011    Assessment: Disease Control: controlled  Progress toward goals: at goal  Barriers to meeting goals: no barriers identified    Patient is diet controlled of her diabetes. She has remained this way since discontinuing her oral sulfonylurea in 2012 in the setting of recurrent hypoglycemia.     Plan: Glucometer log was not reviewed today, as pt did not have glucometer available for review.   Continue to monitor.

## 2012-04-16 NOTE — Assessment & Plan Note (Signed)
Pertinent Data: BP Readings from Last 3 Encounters:  04/16/12 187/65  02/06/12 130/70  12/25/11 0000000    Basic Metabolic Panel:    Component Value Date/Time   NA 137 12/25/2011 1248   K 4.7 12/25/2011 1248   CL 107 12/25/2011 1248   CO2 24 12/25/2011 1248   BUN 15 12/25/2011 1248   CREATININE 0.85 12/25/2011 1248   CREATININE 0.84 07/25/2011 1611   GLUCOSE 100* 12/25/2011 1248   CALCIUM 9.6 12/25/2011 1248    Assessment: Disease Control: not controlled  Progress toward goals: deteriorated  Barriers to meeting goals: missing her Lisinopril 2x/ week    The patient has chronically had poorly controlled blood pressure.  Patient is compliant most of the time with prescribed medications.   Plan:  Will increase lisinopril to 40 mg daily.

## 2012-04-16 NOTE — Assessment & Plan Note (Signed)
Assessment: Possibly bursitis versus arthritis. No symptoms suggestive of lumbar radiculopathy. No specific indication for imaging at this point.   Plan:      Refill Ultracet.  Pt was offered but declined PT and sports medicine (educated that steroid injections may be helpful) - she wants her vascular surgeon Dr. Kellie Simmering to weigh in.  Will consider imaging next visit if symptoms ongoing.  Instructed to return to clinic or ER if symptoms worsen or new symptoms arise.
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
## 2012-05-02 ENCOUNTER — Telehealth: Payer: Self-pay | Admitting: Internal Medicine

## 2012-05-02 MED ORDER — ATORVASTATIN CALCIUM 80 MG PO TABS: 80.0000 mg | ORAL_TABLET | Freq: Every day | ORAL | Status: DC

## 2012-05-02 NOTE — Progress Notes (Signed)
Quick Note:  LDL significantly above her goal of 100 mg/dL. Will need to call pt regarding results and need to escalate therapy to a more potent statin. ______

## 2012-05-02 NOTE — Telephone Encounter (Signed)
  INTERNAL MEDICINE RESIDENCY PROGRAM After-Hours Telephone Call    Reason for call:   I placed an outgoing call to Ms. [name] regarding her recent lipid panel that shows LDL of 168 mg/dL that is well above her goal of 100 mg/dL despite therapy with pravastatin 40 mg (prior intolerance to Simvastatin).    Last encounter / Pertinent Data:   04/16/2012 - Last seen in Clinic by me, Dr. Guy Sandifer for routine office visit.    Assessment/ Plan:   Patient advised to STOP pravastatin.  Advised to start Lipitor 80mg  daily - if she has indication of side effect of medication such as muscle aches, blood in urine, difficulty breathing, shortness of breath, rash, she should stop medication immediately and present for evaluation.  Patient expresses understanding of information provided above.  Lipitor 80mg  sent into [facility] drug electronically.    Chilton Greathouse, D.O.  05/02/2012, 6:33 PM

## 2012-07-02 ENCOUNTER — Ambulatory Visit (INDEPENDENT_AMBULATORY_CARE_PROVIDER_SITE_OTHER): Payer: PRIVATE HEALTH INSURANCE | Admitting: Internal Medicine

## 2012-07-02 ENCOUNTER — Encounter: Payer: Self-pay | Admitting: Internal Medicine

## 2012-07-02 VITALS — BP 139/66 | HR 86 | Temp 98.2°F | Ht 67.0 in | Wt 148.8 lb

## 2012-07-02 DIAGNOSIS — I10 Essential (primary) hypertension: Secondary | ICD-10-CM

## 2012-07-02 DIAGNOSIS — Z720 Tobacco use: Secondary | ICD-10-CM

## 2012-07-02 DIAGNOSIS — E785 Hyperlipidemia, unspecified: Secondary | ICD-10-CM

## 2012-07-02 DIAGNOSIS — Z79899 Other long term (current) drug therapy: Secondary | ICD-10-CM

## 2012-07-02 DIAGNOSIS — Z0000 Encounter for general adult medical examination without abnormal findings: Secondary | ICD-10-CM

## 2012-07-02 DIAGNOSIS — F17200 Nicotine dependence, unspecified, uncomplicated: Secondary | ICD-10-CM

## 2012-07-02 DIAGNOSIS — E119 Type 2 diabetes mellitus without complications: Secondary | ICD-10-CM

## 2012-07-02 DIAGNOSIS — Z13820 Encounter for screening for osteoporosis: Secondary | ICD-10-CM

## 2012-07-02 LAB — POCT GLYCOSYLATED HEMOGLOBIN (HGB A1C): Hemoglobin A1C: 6.2

## 2012-07-02 LAB — GLUCOSE, CAPILLARY: Glucose-Capillary: 116 mg/dL — ABNORMAL HIGH (ref 70–99)

## 2012-07-02 MED ORDER — HYDROCHLOROTHIAZIDE 12.5 MG PO TABS: 12.5000 mg | ORAL_TABLET | Freq: Every day | ORAL | Status: DC

## 2012-07-02 MED ORDER — LISINOPRIL 40 MG PO TABS: 40.0000 mg | ORAL_TABLET | Freq: Every day | ORAL | Status: DC

## 2012-07-02 MED ORDER — ASPIRIN 81 MG PO TABS: 81.0000 mg | ORAL_TABLET | Freq: Every day | ORAL | Status: DC

## 2012-07-02 MED ORDER — ATORVASTATIN CALCIUM 80 MG PO TABS: 80.0000 mg | ORAL_TABLET | Freq: Every day | ORAL | Status: DC
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
## 2012-07-02 NOTE — Assessment & Plan Note (Signed)
Pertinent Labs: Liver Function Tests:    Component Value Date/Time   AST 18 07/25/2011 1611   ALT 14 07/25/2011 1611   ALKPHOS 50 07/25/2011 1611   BILITOT 0.2* 07/25/2011 1611   PROT 6.3 07/25/2011 1611   ALBUMIN 3.6 07/25/2011 1611    Lipid Panel:     Component Value Date/Time   CHOL 249* 04/16/2012 1532   TRIG 103 04/16/2012 1532   HDL 60 04/16/2012 1532   CHOLHDL 4.2 04/16/2012 1532   VLDL 21 04/16/2012 1532   LDLCALC 168* 04/16/2012 1532     Assessment: Goal LDL (per ATP guidelines): < 100 mg/dL  Disease Control: not controlled  Progress toward goals: unable to assess  Barriers to meeting goals: no barriers identified    Prior intolerance to simvastatin, with dizziness.  Changed from pravastatin 40 mg 2 Lipitor 80 mg in August 2013 - she has tolerated this regimen without side effects.   Patient is not fasting today.   Patient is compliant most of the time with prescribed medications.    Plan:  continue current medications  Recheck lipid panel today, adjust therapy as indicated.

## 2012-07-02 NOTE — Assessment & Plan Note (Addendum)
Pertinent Labs: Lab Results  Component Value Date   HGBA1C 6.5 04/16/2012   HGBA1C 5.9 06/18/2010   CREATININE 0.85 12/25/2011   CREATININE 0.84 07/25/2011   MICROALBUR 13.06* 06/19/2010   MICRALBCREAT 95.7* 06/19/2010   CHOL 249* 04/16/2012   HDL 60 04/16/2012   TRIG 103 04/16/2012    Assessment: Disease Control: controlled and by diet. Goal A1c < 8% given age.  Progress toward goals: at goal  Barriers to meeting goals: no barriers identified  Microvascular complications: nephropathy and retinopathy  Macrovascular complications: peripheral vascular disease  On aspirin: No  - states it was stopped by her eye doctor surrounding eye surgery and never restarted.  On statin: Yes Lipitor 80mg  daily.  On ACE-I/ ARB: Yes, Lisinopril 40mg      Plan: Glucometer log was not reviewed today, as pt did not have glucometer available for review.   Continue diet control efforts.  Microalbumin/cr ratio today.  Refuses PCV vaccination.  Check A1c, lipid panel, and CMET today.  Advised to restart daily aspirin.

## 2012-07-02 NOTE — Patient Instructions (Addendum)
Please follow-up at the clinic in 1-2 months, at which time we will reevaluate your blood pressure and diabetes - OR, please follow-up in the clinic sooner if needed.  There have been changes in your medications:  START TAKING ASPIRIN 81MG  DAILY  START Hydrochlorothiazide for you blood pressure - once daily - sent to your pharmacy   Keep working on quitting smoking, you can do it!! See tips below!  You are getting labs today, if they are abnormal I will give you a call.  Please get your DEXA scan (to look at your bone health) as soon as possible.  If you have been started on new medication(s), and you develop symptoms concerning for allergic reaction, including, but not limited to, throat closing, tongue swelling, rash, please stop the medication immediately and call the clinic at [phone], and go to the ER.  Please bring all of your medications in a bag to your next visit.   Smoking Cessation Tips 1-800-QUIT-NOW  This document explains the best ways for you to quit smoking and new treatments to help. It lists new medicines that can double or triple your chances of quitting and quitting for good. It also considers ways to avoid relapses and concerns you may have about quitting, including weight gain.   NICOTINE: A POWERFUL ADDICTION:  If you have tried to quit smoking, you know how hard it can be. It is hard because nicotine is a very addictive drug. For some people, it can be as addictive as heroin or cocaine. Usually, people make 2 or 3 tries, or more, before finally being able to quit. Each time you try to quit, you can learn about what helps and what hurts. Quitting takes hard work and a lot of effort, but you can quit smoking.   QUITTING SMOKING IS ONE OF THE MOST IMPORTANT THINGS YOU WILL EVER DO:  You will live longer, feel better, and live better.  The impact on your body of quitting smoking is felt almost immediately:  Within 20 minutes, blood pressure decreases. Pulse  returns to its normal level.  After 8 hours, carbon monoxide levels in the blood return to normal. Oxygen level increases.  After 24 hours, chance of heart attack starts to decrease. Breath, hair, and body stop smelling like smoke.  After 48 hours, damaged nerve endings begin to recover. Sense of taste and smell improve.  After 72 hours, the body is virtually free of nicotine. Bronchial tubes relax and breathing becomes easier.  After 2 to 12 weeks, lungs can hold more air. Exercise becomes easier and circulation improves.  Quitting will lower your chance of having a heart attack, stroke, cancer, or lung disease:  After 1 year, the risk of coronary heart disease is cut in half.  After 5 years, the risk of stroke falls to the same as a nonsmoker.  After 10 years, the risk of lung cancer is cut in half and the risk of other cancers decreases significantly.  After 15 years, the risk of coronary heart disease drops, usually to the level of a nonsmoker.  If you are pregnant, quitting smoking will improve your chances of having a healthy baby.  The people you live with, especially your children, will be healthier.  You will have extra money to spend on things other than cigarettes.   FIVE KEYS TO QUITTING:  Studies have shown that these 5 steps will help you quit smoking and quit for good. You have the best chances of quitting if  you use them together:   1. GET READY  Set a quit date.  Change your environment.  Get rid of ALL cigarettes, ashtrays, matches, and lighters in your home, car, and place of work.  Do not let people smoke in your home.  Review your past attempts to quit. Think about what worked and what did not.  Once you quit, do not smoke. NOT EVEN A PUFF!   2. GET SUPPORT AND ENCOURAGEMENT  Studies have shown that you have a better chance of being successful if you have help. You can get support in many ways. Tell your family, friends, and coworkers that you are going to quit and  need their support. Ask them not to smoke around you.  Talk to your caregivers (doctor, dentist, nurse, pharmacist, psychologist, and/or smoking counselor).  Get individual, group, or telephone counseling and support. The more counseling you have, the better your chances are of quitting. Programs are available at [employer] and health centers. Call your local health department for information about programs in your area.  Spiritual beliefs and practices may help some smokers quit.  Quit meters are Insurance underwriter that keep track of quit statistics, such as amount of "quit-time," cigarettes not smoked, and money saved.  Many smokers find one or more of the many self-help books available useful in helping them quit and stay off tobacco.   3. LEARN NEW SKILLS AND BEHAVIORS  Try to distract yourself from urges to smoke. Talk to someone, go for a walk, or occupy your time with a task.  When you first try to quit, change your routine. Take a different route to work. Drink tea instead of coffee. Eat breakfast in a different place.  Do something to reduce your stress. Take a hot bath, exercise, or read a book.  Plan something enjoyable to do every day. Reward yourself for not smoking.  Explore interactive web-based programs that specialize in helping you quit.   4. GET MEDICINE AND USE IT CORRECTLY .  Medicines can help you stop smoking and decrease the urge to smoke. Combining medicine with the above behavioral methods and support can quadruple your chances of successfully quitting smoking.  Talk with your doctor about these options.  5. BE PREPARED FOR RELAPSE OR DIFFICULT SITUATIONS  Most relapses occur within the first 3 months after quitting. Do not be discouraged if you start smoking again. Remember, most people try several times before they finally quit.  You may have symptoms of withdrawal because your body is used to nicotine. You may crave cigarettes, be  irritable, feel very hungry, cough often, get headaches, or have difficulty concentrating.  The withdrawal symptoms are only temporary. They are strongest when you first quit, but they will go away within 10 to 14 days.    Here are some difficult situations to watch for:  Alcohol. Avoid drinking alcohol. Drinking lowers your chances of successfully quitting.  Caffeine. Try to reduce the amount of caffeine you consume. It also lowers your chances of successfully quitting.  Other smokers. Being around smoking can make you want to smoke. Avoid smokers.  Weight gain. Many smokers will gain weight when they quit, usually less than 10 pounds. Eat a healthy diet and stay active. Do not let weight gain distract you from your main goal, quitting smoking. Some medicines that help you quit smoking may also help delay weight gain. You can always lose the weight gained after you quit.  Bad mood or  depression. There are a lot of ways to improve your mood other than smoking.   If you are having problems with any of these situations, talk to your caregiver.   SPECIAL SITUATIONS OR CONDITIONS:  Studies suggest that everyone can quit smoking. Your situation or condition can give you a special reason to quit. Pregnant women/New mothers: By quitting, you protect your baby's health and your own.  Hospitalized patients: By quitting, you reduce health problems and help healing.  Heart attack patients: By quitting, you reduce your risk of a second heart attack.  Lung, head, and neck cancer patients: By quitting, you reduce your chance of a second cancer.  Parents of children and adolescents: By quitting, you protect your children from illnesses caused by secondhand smoke.   QUESTIONS TO THINK ABOUT: Think about the following questions before you try to stop smoking. You may want to talk about your answers with your caregiver.  Why do you want to quit?  If you tried to quit in the past, what helped and what did not?    What will be the most difficult situations for you after you quit? How will you plan to handle them?  Who can help you through the tough times? Your family? Friends? Caregiver?  What pleasures do you get from smoking? What ways can you still get pleasure if you quit?   Here are some questions to ask your caregiver:  How can you help me to be successful at quitting?  What medicine do you think would be best for me and how should I take it?  What should I do if I need more help?  What is smoking withdrawal like? How can I get information on withdrawal?   Quitting takes hard work and a lot of effort, but you can quit smoking.   FOR MORE INFORMATION  Smokefree.gov (Inrails.tn) provides free, accurate, evidence-based information and professional assistance to help support the immediate and long-term needs of people trying to quit smoking.  Document Released: 09/10/2001 Document Re-Released: 03/06/2010  [facility] Patient Information 2011 [place].

## 2012-07-02 NOTE — Assessment & Plan Note (Signed)
Health Maintenance  Topic Date Due  . Mammogram  08/04/2012  . Hemoglobin A1c  07/17/2012  . Ophthalmology Exam  08/19/2012  . Foot Exam  04/16/2013  . Lipid Panel  04/16/2013  . Colonoscopy  01/10/2014  . Tetanus/tdap  09[birth year]/1950  . Influenza Vaccine  05/31/2012  . Pneumococcal Polysaccharide Vaccine Age 76 And Over  Addressed  . Zostavax  Addressed    Assessment:  Procedures due: mammo next month  Labs due: lipid panel, CMET, A1c, microalb/cr  Immunizations due: PCV, flu shot, zostavax  Plan:  Refuses all immunizations.  States she gets her mammogram scheduled through [facility].  Labs today.  DEXA scan ordered.

## 2012-07-02 NOTE — Assessment & Plan Note (Addendum)
Pertinent Data: BP Readings from Last 3 Encounters:  07/02/12 139/66  04/16/12 187/65  02/06/12 130/70   Repeat SBP 177, and 155 mmhg subsequently.  Basic Metabolic Panel:    Component Value Date/Time   NA 137 12/25/2011 1248   K 4.7 12/25/2011 1248   CL 107 12/25/2011 1248   CO2 24 12/25/2011 1248   BUN 15 12/25/2011 1248   CREATININE 0.85 12/25/2011 1248   CREATININE 0.84 07/25/2011 1611   GLUCOSE 100* 12/25/2011 1248   CALCIUM 9.6 12/25/2011 1248    Assessment: Disease Control: not controlled at home and in office historically  Progress toward goals: at goal  Barriers to meeting goals: no barriers identified and except continued tobacco abuse.    Patient is compliant all of the time with prescribed medications.   Plan:  continue current medications of Lisinopril  Will add low-dose HCTZ 12.5mg  daily.  Check BMET next visit.  Advised smoking cessation.

## 2012-07-02 NOTE — Assessment & Plan Note (Signed)
1. The patient was counseled on the dangers of tobacco use, which include, but are not limited to cardiovascular disease, increased cancer risk of multiple types of cancer, COPD, peripheral vascular disease, strokes. 2. She was also counseled on the benefits of smoking cessation. 3. The patient was firmly advised to quit and referred to a tobacco cessation program.   4. We also reviewed strategies to maximize success, including:  Removing cigarettes and smoking materials from environment  Stress management  Substitution of other forms of reinforcement Support of family/friends.  Selecting a quit date.  Patient provided contact information for 1-800-QUIT-NOW

## 2012-07-03 LAB — COMPREHENSIVE METABOLIC PANEL
ALT: 21 U/L (ref 0–35)
AST: 20 U/L (ref 0–37)
Albumin: 4.1 g/dL (ref 3.5–5.2)
Alkaline Phosphatase: 66 U/L (ref 39–117)
BUN: 20 mg/dL (ref 6–23)
CO2: 29 mEq/L (ref 19–32)
Calcium: 9.4 mg/dL (ref 8.4–10.5)
Chloride: 103 mEq/L (ref 96–112)
Creat: 0.9 mg/dL (ref 0.50–1.10)
Glucose, Bld: 118 mg/dL — ABNORMAL HIGH (ref 70–99)
Potassium: 4.1 mEq/L (ref 3.5–5.3)
Sodium: 140 mEq/L (ref 135–145)
Total Bilirubin: 0.6 mg/dL (ref 0.3–1.2)
Total Protein: 6.5 g/dL (ref 6.0–8.3)

## 2012-07-03 LAB — LIPID PANEL
Cholesterol: 155 mg/dL (ref 0–200)
HDL: 62 mg/dL (ref >39)
LDL Cholesterol: 77 mg/dL (ref 0–99)
Total CHOL/HDL Ratio: 2.5 Ratio
Triglycerides: 80 mg/dL (ref <150)
VLDL: 16 mg/dL (ref 0–40)

## 2012-07-03 LAB — MICROALBUMIN / CREATININE URINE RATIO
Creatinine, Urine: 125.6 mg/dL
Microalb Creat Ratio: 474.4 mg/g — ABNORMAL HIGH (ref 0.0–30.0)
Microalb, Ur: 59.59 mg/dL — ABNORMAL HIGH (ref 0.00–1.89)

## 2012-07-03 NOTE — Progress Notes (Signed)
Quick Note:  LDL is now at goal (< 100) after initiation of Lipitor. Will continue current regimen. ______

## 2012-07-03 NOTE — Progress Notes (Signed)
Quick Note:  Continues to have proteinuria. Is already on an ACE-I. ______

## 2012-07-03 NOTE — Progress Notes (Signed)
Quick Note:  Patient remains with excellent glycemic control of her diet controlled diabetes. ______

## 2012-07-06 ENCOUNTER — Ambulatory Visit (HOSPITAL_COMMUNITY): Payer: PRIVATE HEALTH INSURANCE

## 2012-07-08 ENCOUNTER — Ambulatory Visit (HOSPITAL_COMMUNITY)
Admission: RE | Admit: 2012-07-08 | Discharge: 2012-07-08 | Disposition: A | Discharge status: Home / Self Care | Payer: PRIVATE HEALTH INSURANCE | Source: Ambulatory Visit | Attending: Internal Medicine | Admitting: Internal Medicine

## 2012-07-08 DIAGNOSIS — Z780 Asymptomatic menopausal state: Secondary | ICD-10-CM | POA: Insufficient documentation

## 2012-07-08 DIAGNOSIS — Z13820 Encounter for screening for osteoporosis: Secondary | ICD-10-CM | POA: Insufficient documentation

## 2012-07-10 ENCOUNTER — Other Ambulatory Visit: Payer: Self-pay | Admitting: Internal Medicine

## 2012-07-10 DIAGNOSIS — Z1231 Encounter for screening mammogram for malignant neoplasm of breast: Secondary | ICD-10-CM

## 2012-07-14 ENCOUNTER — Ambulatory Visit (INDEPENDENT_AMBULATORY_CARE_PROVIDER_SITE_OTHER): Payer: PRIVATE HEALTH INSURANCE | Admitting: Internal Medicine

## 2012-07-14 ENCOUNTER — Encounter: Payer: Self-pay | Admitting: Internal Medicine

## 2012-07-14 VITALS — BP 127/60 | HR 56 | Temp 97.2°F | Ht 67.0 in | Wt 146.4 lb

## 2012-07-14 DIAGNOSIS — R911 Solitary pulmonary nodule: Secondary | ICD-10-CM

## 2012-07-14 DIAGNOSIS — F17200 Nicotine dependence, unspecified, uncomplicated: Secondary | ICD-10-CM

## 2012-07-14 DIAGNOSIS — I10 Essential (primary) hypertension: Secondary | ICD-10-CM

## 2012-07-14 DIAGNOSIS — J984 Other disorders of lung: Secondary | ICD-10-CM

## 2012-07-14 DIAGNOSIS — Z0000 Encounter for general adult medical examination without abnormal findings: Secondary | ICD-10-CM

## 2012-07-14 DIAGNOSIS — E119 Type 2 diabetes mellitus without complications: Secondary | ICD-10-CM

## 2012-07-14 DIAGNOSIS — Z720 Tobacco use: Secondary | ICD-10-CM

## 2012-07-14 LAB — GLUCOSE, CAPILLARY: Glucose-Capillary: 151 mg/dL — ABNORMAL HIGH (ref 70–99)

## 2012-07-14 MED ORDER — VARENICLINE TARTRATE 0.5 MG PO TABS: ORAL_TABLET | ORAL | Status: DC

## 2012-07-14 NOTE — Assessment & Plan Note (Signed)
Patient interested in smoking cessation at this time, but not interested in patches, because her daughter (who passed away in 01/06/1995) felt extremely ill with nicotine patches.  She is interested in a pill.  I prescribed Chantix as follows: Days 1-3: 0.5 mg once daily Days 4-7: 0.5 mg twice daily Maintenance (? Day 8): U.S. labeling: 1 mg twice daily for 11 weeks Note: Start 1 week before target quit date  She should stop medication and seek medical attn immediately if she experiences mood changes or SI/HI.  She verbalized understanding.  Please follow up with smoking cessation progress at next visit.

## 2012-07-14 NOTE — Assessment & Plan Note (Signed)
Noticed on chart review.  Of note, patient has history of latent TB treated with INH CT scan from 2009 - 2 mm right upper lobe lung nodule, Single mildly enlarged low right paratracheal lymph node, likely reactive. Consider follow-up chest CT at 3 months.  Follow up CT was cancelled as "no show".  Patient did not recall anything about nodule.  Given smoking history, may consider repeat chest CT with contrast - would proceed cautiously to monitor renal function ( d/c lisinopril before hand, etc).  But will first discuss with PCP to see what has already been discussed with patient.

## 2012-07-14 NOTE — Assessment & Plan Note (Signed)
Lab Results  Component Value Date   NA 140 07/02/2012   K 4.1 07/02/2012   CL 103 07/02/2012   CO2 29 07/02/2012   BUN 20 07/02/2012   CREATININE 0.90 07/02/2012   CREATININE 0.85 12/25/2011    BP Readings from Last 3 Encounters:  07/14/12 127/60  07/02/12 139/66  04/16/12 187/65    Assessment: Hypertension control:  controlled  Progress toward goals:  at goal Barriers to meeting goals:  no barriers identified  Plan: Hypertension treatment:  Patient felt faint/dizzy when taking HCTZ, so has not taken medication for the last 2 days.  I have asked that she discontinue this medication, especially given her age and fall risk.  Given fall risks and increased likelihood of electrolyte abnormalities in the geriatric population, may consider low dose CCB such as amlodipine 2.5mg  if BP elevated at follow up.  Alternatively, may consider HCTZ every other day.  Patient will return in 1 month for BP follow up with PCP.

## 2012-07-14 NOTE — Patient Instructions (Addendum)
-  Stop taking Hydrochlorothiazide.  -Your bone scan looks good - but quitting smoking is important to help prevent fractures!  -I have sent Chantix to your pharmacy.  Take it as follows: Days 1-3: 0.5 mg once daily Days 4-7: 0.5 mg twice daily Maintenance (? Day 8): U.S. labeling: 1 mg twice daily for 11 weeks Note: Start 1 week before target quit date  If you start to have mood changes/not feel like yourself, or feel like hurting yourself or others, please stop taking the medication and seek medical attention immediately.  Please be sure to bring all of your medications with you to every visit.  Should you have any new or worsening symptoms, please be sure to call the clinic at [phone].

## 2012-07-14 NOTE — Assessment & Plan Note (Signed)
Discussed results from DEXA scan today.  Based on FRAX, 10 year hip fracture risk = 2.2% and major osteoperotic fracture = 7.8%.  This does not suggest initiation of treatment with Calcium at this time (Major risk <10).  The USPSTF does recommend Vit D supplementation to prevent falls in community dwelling adults >76 yo who are at increased risk for falls (Grade B).  May consider addition of Vit D in the future, esp if she becomes an increased fall risk.

## 2012-07-14 NOTE — Progress Notes (Signed)
Subjective:   [name] female   DOB: 07-29-1929 78 y.o.   MRN: [id number]  HPI: [name] is a 78 y.o. woman with a PMHx of HTN, tobacco abuse, HLD, and PVD who was recently seen by her PCP on 07/02/12 for mgmt of her HTN, HLD (LDL=77 on Lipitor), and DM (A1c = 6.2) with proteinuria (microalb/creat = 474).  At last visit, she was started on HCTZ 12.5mg  daily d/t uncontrolled HTN.  She stopped taking HCTZ 2 days prior because she felt faint/dizzy when taking this medication - symptoms have resolved with medication cessation.  She returns today to review bone density scan.    To note, she notes only a wrist fracture in the distant past d/t a fall.  The most recent fall she reports was in 2010, she accidentally fell walking down the stairs.  No history of steroid or drug use.  She continues to smoke, but interested in quitting.  Not interested in nicotine patches because she is worried she will feel sick.   Past Medical History  Diagnosis Date  . Intraductal carcinoma 06/2003    Of right breast. s/p right partial mastectomy. // Followed by Dr. Marylene Buerger  . PVD (peripheral vascular disease)     S/P BL femoral-popliteal bypass surgery 03/2001 (right) and 01/2003 (left)  . Diabetes mellitus, type 2     Well controlled  . Hypertension   . Hyperlipidemia   . TB lung, latent     Treated with INH in 11/2007   Current Outpatient Prescriptions  Medication Sig Dispense Refill  . aspirin 81 MG tablet Take 1 tablet (81 mg total) by mouth daily.  30 tablet  5  . atorvastatin (LIPITOR) 80 MG tablet Take 1 tablet (80 mg total) by mouth daily.  30 tablet  5  . hydrochlorothiazide (HYDRODIURIL) 12.5 MG tablet Take 1 tablet (12.5 mg total) by mouth daily.  30 tablet  1  . lisinopril (PRINIVIL,ZESTRIL) 40 MG tablet Take 1 tablet (40 mg total) by mouth daily.  30 tablet  5  . prednisoLONE acetate (PRED FORTE) 1 % ophthalmic suspension Place 1 drop into the right eye Every 2  hours.      . traMADol-acetaminophen (ULTRACET) 37.5-325 MG per tablet Take 1 tablet by mouth every 6 (six) hours as needed. For pain. You have been started on a new medication that can cause drowsiness, do not drive or operate heavy machinery . Do not take this medication with alcohol.  30 tablet  4   Family History  Problem Relation Age of Onset  . Cancer Daughter 15    uterine cancer  . Cancer Father    History   Social History  . Marital Status: Widowed    Spouse Name: N/A    Number of Children: N/A  . Years of Education: N/A   Occupational History  . nurse     Private duty nurse   Social History Main Topics  . Smoking status: Current Every Day Smoker -- 0.5 packs/day for 64 years    Types: Cigarettes  . Smokeless tobacco: Never Used   Comment: previously smoked 1 ppd x 64 years, now is at 0.5 ppd x 6 months.  . Alcohol Use: No  . Drug Use: No  . Sexually Active: Yes   Other Topics Concern  . Not on file   Social History Narrative  . No narrative on file   Review of Systems: General: no fevers, chills, changes in weight,  changes in appetite Skin: no rash HEENT: no blurry vision, hearing changes, sore throat Pulm: no dyspnea, coughing, wheezing CV: no chest pain, palpitations, shortness of breath Abd: no abdominal pain, nausea/vomiting, diarrhea/constipation GU: no dysuria, hematuria, polyuria Ext: no arthralgias, myalgias Neuro: no weakness, numbness, or tingling   Objective:  Physical Exam: Filed Vitals:   07/14/12 0855  BP: 127/60  Pulse: 56  Temp: 97.2 F (36.2 C)  TempSrc: Oral  Height: 5\' 7"  (1.702 m)  Weight: 146 lb 6.4 oz (66.407 kg)  SpO2: 100%   Constitutional: Vital signs reviewed.  Patient is a well-developed and well-nourished woman in no acute distress and cooperative with exam.  Mouth: no erythema or exudates, MMM Eyes: PERRL, EOMI, conjunctivae normal, No scleral icterus.   Cardiovascular: RRR, S1 normal, S2 normal, no MRG, pulses  symmetric and intact bilaterally Pulmonary/Chest: CTAB, no wheezes, rales, or rhonchi Abdominal: Soft. Non-tender, non-distended, bowel sounds are normal, no masses, organomegaly, or guarding present.  Neurological: A&O x3 Skin: Warm, dry and intact. No rash, cyanosis, or clubbing.  Psychiatric: Normal mood and affect. speech and behavior is normal. Judgment and thought content normal. Cognition and memory are normal.   Assessment & Plan:   Case and care discussed with Dr. Cathren Laine. Patient to return in 1 month to follow up with PCP regarding BP and progress on smoking cessation. Please see problem oriented charting for further details.

## 2012-07-22 ENCOUNTER — Encounter: Payer: Self-pay | Admitting: Internal Medicine

## 2012-07-22 DIAGNOSIS — M5030 Other cervical disc degeneration, unspecified cervical region: Secondary | ICD-10-CM | POA: Insufficient documentation

## 2012-08-11 ENCOUNTER — Ambulatory Visit
Admission: RE | Admit: 2012-08-11 | Discharge: 2012-08-11 | Disposition: A | Discharge status: Home / Self Care | Payer: Medicaid Other | Source: Ambulatory Visit | Attending: Internal Medicine | Admitting: Internal Medicine

## 2012-08-11 DIAGNOSIS — Z1231 Encounter for screening mammogram for malignant neoplasm of breast: Secondary | ICD-10-CM

## 2012-09-01 ENCOUNTER — Encounter: Payer: Self-pay | Admitting: Internal Medicine

## 2012-09-10 ENCOUNTER — Encounter: Payer: Self-pay | Admitting: Internal Medicine

## 2012-09-10 ENCOUNTER — Ambulatory Visit (INDEPENDENT_AMBULATORY_CARE_PROVIDER_SITE_OTHER): Payer: PRIVATE HEALTH INSURANCE | Admitting: Internal Medicine

## 2012-09-10 ENCOUNTER — Other Ambulatory Visit (HOSPITAL_COMMUNITY)
Admission: RE | Admit: 2012-09-10 | Discharge: 2012-09-10 | Disposition: A | Discharge status: Home / Self Care | Payer: PRIVATE HEALTH INSURANCE | Source: Ambulatory Visit | Attending: Internal Medicine | Admitting: Internal Medicine

## 2012-09-10 VITALS — BP 157/62 | HR 66 | Temp 99.3°F | Ht 66.0 in | Wt 151.2 lb

## 2012-09-10 DIAGNOSIS — N760 Acute vaginitis: Secondary | ICD-10-CM | POA: Insufficient documentation

## 2012-09-10 DIAGNOSIS — N898 Other specified noninflammatory disorders of vagina: Secondary | ICD-10-CM

## 2012-09-10 DIAGNOSIS — J984 Other disorders of lung: Secondary | ICD-10-CM

## 2012-09-10 DIAGNOSIS — R911 Solitary pulmonary nodule: Secondary | ICD-10-CM

## 2012-09-10 DIAGNOSIS — I10 Essential (primary) hypertension: Secondary | ICD-10-CM

## 2012-09-10 DIAGNOSIS — E118 Type 2 diabetes mellitus with unspecified complications: Secondary | ICD-10-CM

## 2012-09-10 DIAGNOSIS — K5900 Constipation, unspecified: Secondary | ICD-10-CM | POA: Insufficient documentation

## 2012-09-10 DIAGNOSIS — E785 Hyperlipidemia, unspecified: Secondary | ICD-10-CM

## 2012-09-10 DIAGNOSIS — Z0000 Encounter for general adult medical examination without abnormal findings: Secondary | ICD-10-CM

## 2012-09-10 DIAGNOSIS — Z113 Encounter for screening for infections with a predominantly sexual mode of transmission: Secondary | ICD-10-CM | POA: Insufficient documentation

## 2012-09-10 DIAGNOSIS — Z720 Tobacco use: Secondary | ICD-10-CM

## 2012-09-10 DIAGNOSIS — E119 Type 2 diabetes mellitus without complications: Secondary | ICD-10-CM

## 2012-09-10 DIAGNOSIS — F17200 Nicotine dependence, unspecified, uncomplicated: Secondary | ICD-10-CM

## 2012-09-10 LAB — GLUCOSE, CAPILLARY: Glucose-Capillary: 164 mg/dL — ABNORMAL HIGH (ref 70–99)

## 2012-09-10 MED ORDER — SENNA-DOCUSATE SODIUM 8.6-50 MG PO TABS: 1.0000 | ORAL_TABLET | Freq: Two times a day (BID) | ORAL | Status: DC | PRN

## 2012-09-10 MED ORDER — AMLODIPINE BESYLATE 2.5 MG PO TABS: 2.5000 mg | ORAL_TABLET | Freq: Every day | ORAL | Status: DC

## 2012-09-10 NOTE — Patient Instructions (Addendum)
General Instructions:  Please follow-up at the clinic in 3 months, at which time we will reevaluate your blood pressure, talk about that lung nodule again - OR, please follow-up in the clinic sooner if needed.  There have been changes in your medications:  START Amlodipine for your blood pressure - I am sending in only a 1 month supply - call me and let me know if you are tolerating the medication ok, and then I can refill it for you.  START senna-kot for your constipation - take it twice daily as needed for constipation   You are getting labs today, if they are abnormal I will give you a call.   If you have been started on new medication(s), and you develop symptoms concerning for allergic reaction, including, but not limited to, throat closing, tongue swelling, rash, please stop the medication immediately and call the clinic at [phone], and go to the ER.  If you are diabetic, please bring your meter to your next visit.  If symptoms worsen, or new symptoms arise, please call the clinic or go to the ER.  PLEASE BRING ALL OF YOUR MEDICATIONS  IN A BAG TO YOUR NEXT APPOINTMENT   Treatment Goals:  Goals (1 Years of Data) as of 09/10/2012          07/02/12 04/16/12 11/11/11 07/25/11     Result Component    . HEMOGLOBIN A1C < 8  6.2 6.5 7.1 6.7    . LDL CALC < 100  77 168  110      Progress Toward Treatment Goals:  Treatment Goal 09/10/2012  Hemoglobin A1C at goal  Blood pressure unchanged  Stop smoking smoking less    Self Care Goals & Plans:  Self Care Goal 09/10/2012  Manage my medications take my medicines as prescribed; bring my medications to every visit  Monitor my health keep track of my blood glucose  Eat healthy foods eat foods that are low in salt; eat more vegetables; eat smaller portions  Be physically active find time in my schedule    Home Blood Glucose Monitoring 09/10/2012  Check my blood sugar (No Data)     Care Management & Community Referrals:

## 2012-09-10 NOTE — Assessment & Plan Note (Signed)
Pertinent Data:  CT Chest without contrast (12/2007) - No acute process in the chest or suspicious lung nodule. 2. Single mildly enlarged low right paratracheal lymph node, likely reactive. Consider follow-up chest CT at 3 months.  Assessment: This above mentioned lung nodule has not been followed up. Given the patient's significant tobacco abuse, it will be important to continue to monitor for any changes. The patient denies any unintentional weight loss or pulmonary symptoms at present. Today, we had a discussion about potential diagnoses of this nodule, if it were simply reactive, versus possible cancer in the setting of her tobacco abuse. The patient declines CT chest for continued evaluation. States that she has to think about it, and may consider at our next encounter.  Plan:      Defer CT chest without today, however, will need to reevaluate at each visit, as this will require monitoring.

## 2012-09-10 NOTE — Assessment & Plan Note (Signed)
  Assessment: 1. The patient was counseled on the dangers of tobacco use, which include, but are not limited to cardiovascular disease, increased cancer risk of multiple types of cancer, COPD, peripheral vascular disease, strokes. 2. She was also counseled on the benefits of smoking cessation. 3. Progress toward smoking cessation:  other (see comments) (feeling bored and lonely) 4. Barriers to progress toward smoking cessation:  smoking less  Plan: 1. Instruction/counseling given: The patient was firmly advised to quit and referred to a tobacco cessation program.   2. We also reviewed strategies to maximize success, including:  Removing cigarettes and smoking materials from environment  Stress management  Substitution of other forms of reinforcement Support of family/friends.  Selecting a quit date. 3. Educational resources provided:   4. Self management tools provided:   5. Medications to assist with smoking cessation: Varenicline (Chantix) 6. Patient agreed to the following self-care plans for smoking cessation:   can continue to work on smoking cessation, slowly cutting down on cigarette usage.
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
## 2012-09-10 NOTE — Assessment & Plan Note (Addendum)
Assessment: Patient indicates recent two-month history of vaginal discharge. Of note, she is sexually active, with last episode approximately 2 months ago. The partner did not have known STD at the time. However, she indicates that in the past, she has had history of Trichomonas in 2006, or which both she and her partner were treated.  Plan:      Will check wet prep, GC, and Chlamydia today - treat as appropriate.

## 2012-09-10 NOTE — Assessment & Plan Note (Signed)
Pertinent Labs: Liver Function Tests:    Component Value Date/Time   AST 20 07/02/2012 1450   ALT 21 07/02/2012 1450   ALKPHOS 66 07/02/2012 1450   BILITOT 0.6 07/02/2012 1450   PROT 6.5 07/02/2012 1450   ALBUMIN 4.1 07/02/2012 1450    Lipid Panel:     Component Value Date/Time   CHOL 155 07/02/2012 1450   TRIG 80 07/02/2012 1450   HDL 62 07/02/2012 1450   CHOLHDL 2.5 07/02/2012 1450   VLDL 16 07/02/2012 1450   LDLCALC 77 07/02/2012 1450     Assessment: Goal LDL (per ATP guidelines): < 100 mg/dL  Disease Control: controlled  Progress toward goals: at goal  Barriers to meeting goals: no barriers identified    She has had prior intolerance to simvastatin with associated dizziness.   Patient is compliant all of the time with prescribed medications.    Plan:  continue current medications

## 2012-09-10 NOTE — Assessment & Plan Note (Addendum)
Assessment: Last bowel movement 3 days ago, is still passing gas. There is a potential for amlodipine to cause worsening constipation, therefore it will be important for her to stay on a stool regimen per  Plan:      Will prescribe Senokot twice a day as needed

## 2012-09-10 NOTE — Assessment & Plan Note (Signed)
Pertinent Data: BP Readings from Last 3 Encounters:  09/10/12 157/62  07/14/12 127/60  07/02/12 [id number]    Basic Metabolic Panel:    Component Value Date/Time   NA 140 07/02/2012 1450   K 4.1 07/02/2012 1450   CL 103 07/02/2012 1450   CO2 29 07/02/2012 1450   BUN 20 07/02/2012 1450   CREATININE 0.90 07/02/2012 1450   CREATININE 0.85 12/25/2011 1248   GLUCOSE 118* 07/02/2012 1450   CALCIUM 9.4 07/02/2012 1450    Assessment: Disease Control: mildly elevated  Progress toward goals: unchanged  Barriers to meeting goals: continued smoking.    The patient has had dizziness with hydrochlorothiazide. Therefore, will not resume this medication.  She has multiple stroke risk factors including her ongoing smoking, age, uncontrolled hypertension. Therefore, I do feel that it is important to improve her blood pressure control..    Patient is compliant most of the time with prescribed medications (lisinopril 40 mg daily)  Plan:  Continue lisinopril at current dosage.  Will start amlodipine 2.5 mg daily.  Educational resources provided:    Self management tools provided:

## 2012-09-10 NOTE — Assessment & Plan Note (Signed)
Pertinent Labs: Lab Results  Component Value Date   HGBA1C 6.2 07/02/2012   HGBA1C 5.9 06/18/2010   CREATININE 0.90 07/02/2012   CREATININE 0.85 12/25/2011   MICROALBUR 59.59* 07/02/2012   MICRALBCREAT 474.4* 07/02/2012   CHOL 155 07/02/2012   HDL 62 07/02/2012   TRIG 80 07/02/2012    Assessment: Disease Control: good control (HgbA1C at goal)  Progress toward goals: at goal  Barriers to meeting goals: no barriers identified  Microvascular complications: retinopathy  Macrovascular complications: peripheral vascular disease  On aspirin: Yes    On statin: Yes  On ACE-I/ ARB: Yes    Patient is not currently on any prescribed medications.   Plan: Glucometer log was not reviewed today, as pt did not have glucometer available for review.   Continue diet control measures.  reminded to get eye exam and reminded to bring blood glucose meter & log to each visit  Educational resources provided:    Self management tools provided:    Home glucose monitoring recommendation:  (as needed when feeling poorly.)

## 2012-09-10 NOTE — Assessment & Plan Note (Signed)
Health Maintenance  Topic Date Due  . Pneumococcal Polysaccharide Vaccine (#1) 09/04/1931  . Ophthalmology Exam  08/19/2012  . Hemoglobin A1c  10/02/2012  . Foot Exam  04/16/2013  . Lipid Panel  07/02/2013  . Mammogram  08/11/2013  . Colonoscopy  01/10/2014  . Tetanus/tdap  09/04/1950  . Influenza Vaccine  05/31/2012  . Pneumococcal Polysaccharide Vaccine Age 76 And Over  Addressed  . Zostavax  Addressed    Assessment:  Procedures due: Eye exam  Labs due: None  Immunizations due: Due for all of her immunizations, including PCP, influenza, T. dap, shingles.  Plan:  Continues to refuse all immunizations.   Continue to readdress at each visit

## 2012-09-11 ENCOUNTER — Telehealth: Payer: Self-pay | Admitting: Internal Medicine

## 2012-09-11 DIAGNOSIS — A599 Trichomoniasis, unspecified: Secondary | ICD-10-CM

## 2012-09-11 MED ORDER — METRONIDAZOLE 500 MG PO TABS: 1000.0000 mg | ORAL_TABLET | Freq: Two times a day (BID) | ORAL | Status: DC

## 2012-09-11 NOTE — Progress Notes (Signed)
Quick Note:  Will tx with Flagyl. Patient called and informed of results. ______

## 2012-09-11 NOTE — Telephone Encounter (Signed)
  INTERNAL MEDICINE RESIDENCY PROGRAM After-Hours Telephone Call    Reason for call:   I placed an outgoing call to Ms. 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 This explains the vaginal discharge she has been experiencing.    Last encounter / Pertinent Data:   09/10/12 - Last seen in Clinic by me for health maintenance and for evaluation of 2 week history of vaginal discharge.    Assessment/ Plan:   Trichomonas vaginalis infection.  Patient was informed of her diagnosis and that BOTH she and her partner would require treatment for STI.   I have e-prescribed Flagyl 1000mg  BID x 2 days - patient advised to avoid use of alcohol while taking this medication.  Patient expressed understanding of the information presented.  As always, pt is advised that if symptoms worsen or new symptoms arise, they should go to an urgent care facility or to to ER for further evaluation.     Chilton Greathouse, D.O.  09/11/2012, 5:54 PM

## 2012-09-28 ENCOUNTER — Other Ambulatory Visit: Payer: Self-pay | Admitting: Internal Medicine

## 2012-09-28 NOTE — Telephone Encounter (Signed)
Please call her pharmacy and inform them that she is no longer on Lisinopril 20mg  daily. She is on the higher dose of 40mg  daily. She was prescribed a 6 month supply of the correct dosage in 06/2012 - therefore, she is not due for a refill.  Please specifically call the pharmacy and tell them about this, so that they can update their records and the incorrect dosing is not inadvertently filled.   Thank you! - Chilton Greathouse, D.O., 09/28/2012, 10:14 PM

## 2012-11-05 ENCOUNTER — Encounter: Payer: Self-pay | Admitting: Internal Medicine

## 2012-11-05 ENCOUNTER — Ambulatory Visit (INDEPENDENT_AMBULATORY_CARE_PROVIDER_SITE_OTHER): Payer: PRIVATE HEALTH INSURANCE | Admitting: Internal Medicine

## 2012-11-05 VITALS — BP 140/65 | HR 74 | Temp 97.0°F | Resp 20 | Ht 65.0 in | Wt 143.4 lb

## 2012-11-05 DIAGNOSIS — R42 Dizziness and giddiness: Secondary | ICD-10-CM

## 2012-11-05 DIAGNOSIS — Z79899 Other long term (current) drug therapy: Secondary | ICD-10-CM

## 2012-11-05 DIAGNOSIS — E785 Hyperlipidemia, unspecified: Secondary | ICD-10-CM

## 2012-11-05 DIAGNOSIS — I10 Essential (primary) hypertension: Secondary | ICD-10-CM

## 2012-11-05 DIAGNOSIS — E119 Type 2 diabetes mellitus without complications: Secondary | ICD-10-CM

## 2012-11-05 LAB — POCT GLYCOSYLATED HEMOGLOBIN (HGB A1C): Hemoglobin A1C: 8.1

## 2012-11-05 LAB — GLUCOSE, CAPILLARY: Glucose-Capillary: 209 mg/dL — ABNORMAL HIGH (ref 70–99)

## 2012-11-05 NOTE — Progress Notes (Signed)
Subjective:   [name] female   DOB: [birth year] 79 y.o.   MRN: [id number]  HPI: Gina [name] is a 79 y.o. female with past medical history significant as outlined below who presented to the clinic for dizziness.  It started 1 week ago  at church when she felt dizzy. Denies any chest pain, SOB, nausea, vomiting, weakness . After sitting down for a while she felt fine and drove herself home. Stopped Lipitor and Lisinopril. Since then she has been feeling fine. She took her Lisinopril today.  Not taking Norvasc as prescribed during last office visit  Not taking chantix or pain medication  Past Medical History  Diagnosis Date  . Intraductal carcinoma 06/2003    Of right breast. s/p right partial mastectomy. // Followed by Dr. Marylene Buerger  . PVD (peripheral vascular disease)     S/P BL femoral-popliteal bypass surgery 03/2001 (right) and 01/2003 (left)  . Diabetes mellitus, type 2     Well controlled  . Hypertension   . Hyperlipidemia   . TB lung, latent     Treated with INH in 11/2007   Current Outpatient Prescriptions  Medication Sig Dispense Refill  . aspirin 81 MG tablet Take 1 tablet (81 mg total) by mouth daily.  30 tablet  5  . lisinopril (PRINIVIL,ZESTRIL) 40 MG tablet Take 1 tablet (40 mg total) by mouth daily.  30 tablet  5  . dorzolamide-timolol (COSOPT) 22.3-6.8 MG/ML ophthalmic solution       . prednisoLONE acetate (PRED FORTE) 1 % ophthalmic suspension Place 1 drop into the right eye Every 2 hours.      . sennosides-docusate sodium (SENOKOT-S) 8.6-50 MG tablet Take 1 tablet by mouth 2 (two) times daily as needed for constipation.  60 tablet  1   Family History  Problem Relation Age of Onset  . Cancer Daughter 19    uterine cancer  . Cancer Father    History   Social History  . Marital Status: Widowed    Spouse Name: N/A    Number of Children: N/A  . Years of Education: N/A   Occupational History  . nurse     Private duty nurse    Social History Main Topics  . Smoking status: Current Every Day Smoker -- 0.5 packs/day for 64 years    Types: Cigarettes  . Smokeless tobacco: Never Used     Comment: previously smoked 1 ppd x 64 years, now is at 0.5 ppd x 6 months.  . Alcohol Use: No  . Drug Use: No  . Sexually Active: Yes   Other Topics Concern  . Not on file   Social History Narrative  . No narrative on file   Review of Systems: Constitutional: Denies fever, chills, diaphoresis, appetite change and fatigue.  HEENT: Denies photophobia,  hearing loss, ear pain, pain, neck stiffness    Respiratory: Denies SOB, DOE, cough, chest tightness,  and wheezing.   Cardiovascular: Denies chest pain, palpitations and leg swelling.  Gastrointestinal: Denies nausea, vomiting, abdominal pain, diarrhea,  Musculoskeletal: Denies myalgias, back pain, joint swelling, arthralgias and gait problem.  Neurological: Noted one episode of  dizziness but denies  seizures, syncope, weakness, numbness and headaches.  Hematological: Denies adenopathy. Easy bruising, personal or family bleeding history  Psychiatric/Behavioral: Denies suicidal ideation, mood changes, confusion, nervousness, sleep disturbance and agitation  Objective:  Physical Exam: Filed Vitals:   11/05/12 1545 11/05/12 1546  BP: 169/65 140/65  Pulse:  74  Temp:  97 F (36.1 C)  TempSrc:  Oral  Resp:  20  Height:  5\' 5"  (1.651 m)  Weight:  143 lb 6.4 oz (65.046 kg)  SpO2:  97%   Constitutional: Vital signs reviewed.  Patient is a well-developed and well-nourished female in no acute distress and cooperative with exam. Alert and oriented x3.  Eyes: PERRL, EOMI, conjunctivae normal, No scleral icterus.  Neck: Supple,  Cardiovascular: RRR, S1 normal, S2 normal, no MRG, pulses symmetric and intact bilaterally Pulmonary/Chest: CTAB, no wheezes, rales, or rhonchi Abdominal: Soft. Non-tender, non-distended, bowel sounds are normal, Neurological: A&O x3, Strength is  normal and symmetric bilaterally, cranial nerve II-XII are grossly intact, no focal motor deficit, sensory intact to light touch bilaterally.

## 2012-11-05 NOTE — Assessment & Plan Note (Addendum)
At this point I will continue Lisinopril  40 mg daily. I will reevaluate the patient in 2 weeks. Recommended the patient to call the clinic or go to the emergency room if her dizziness worsened or she is experiencing any chest pain or shortness of breath.  She never filled the prescription for Norvasc 2.5 mg daily which was prescribed

## 2012-11-05 NOTE — Assessment & Plan Note (Signed)
Unclear etiology. In the office patient was noted to be orthostatic but asymptomatic. At this point I will continue lisinopril 40 mg daily. Recommended the patient

## 2012-11-05 NOTE — Assessment & Plan Note (Signed)
I would discontinue Lipitor for now. She may tolerate a lower dose

## 2012-11-05 NOTE — Patient Instructions (Signed)
Please call the clinic as soon as possible if he experienced any dizziness, chest pain or shortness breath.

## 2013-01-28 ENCOUNTER — Encounter: Payer: Self-pay | Admitting: Internal Medicine

## 2013-01-28 ENCOUNTER — Ambulatory Visit (INDEPENDENT_AMBULATORY_CARE_PROVIDER_SITE_OTHER): Payer: PRIVATE HEALTH INSURANCE | Admitting: Internal Medicine

## 2013-01-28 VITALS — BP 183/64 | HR 100 | Temp 98.7°F | Ht 66.0 in | Wt 139.0 lb

## 2013-01-28 DIAGNOSIS — I5031 Acute diastolic (congestive) heart failure: Secondary | ICD-10-CM | POA: Insufficient documentation

## 2013-01-28 DIAGNOSIS — R82998 Other abnormal findings in urine: Secondary | ICD-10-CM

## 2013-01-28 DIAGNOSIS — Z720 Tobacco use: Secondary | ICD-10-CM

## 2013-01-28 DIAGNOSIS — R600 Localized edema: Secondary | ICD-10-CM

## 2013-01-28 DIAGNOSIS — J984 Other disorders of lung: Secondary | ICD-10-CM

## 2013-01-28 DIAGNOSIS — R609 Edema, unspecified: Secondary | ICD-10-CM

## 2013-01-28 DIAGNOSIS — F17200 Nicotine dependence, unspecified, uncomplicated: Secondary | ICD-10-CM

## 2013-01-28 DIAGNOSIS — I10 Essential (primary) hypertension: Secondary | ICD-10-CM

## 2013-01-28 DIAGNOSIS — R7989 Other specified abnormal findings of blood chemistry: Secondary | ICD-10-CM

## 2013-01-28 DIAGNOSIS — R634 Abnormal weight loss: Secondary | ICD-10-CM | POA: Insufficient documentation

## 2013-01-28 DIAGNOSIS — E785 Hyperlipidemia, unspecified: Secondary | ICD-10-CM

## 2013-01-28 DIAGNOSIS — Z0000 Encounter for general adult medical examination without abnormal findings: Secondary | ICD-10-CM

## 2013-01-28 DIAGNOSIS — E119 Type 2 diabetes mellitus without complications: Secondary | ICD-10-CM

## 2013-01-28 DIAGNOSIS — R6889 Other general symptoms and signs: Secondary | ICD-10-CM

## 2013-01-28 HISTORY — DX: Acute diastolic (congestive) heart failure: I50.31

## 2013-01-28 LAB — COMPREHENSIVE METABOLIC PANEL
ALT: 9 U/L (ref 0–35)
AST: 11 U/L (ref 0–37)
Albumin: 3.3 g/dL — ABNORMAL LOW (ref 3.5–5.2)
Alkaline Phosphatase: 57 U/L (ref 39–117)
BUN: 10 mg/dL (ref 6–23)
CO2: 28 mEq/L (ref 19–32)
Calcium: 9 mg/dL (ref 8.4–10.5)
Chloride: 105 mEq/L (ref 96–112)
Creat: 0.6 mg/dL (ref 0.50–1.10)
Glucose, Bld: 103 mg/dL — ABNORMAL HIGH (ref 70–99)
Potassium: 3.4 mEq/L — ABNORMAL LOW (ref 3.5–5.3)
Sodium: 139 mEq/L (ref 135–145)
Total Bilirubin: 0.5 mg/dL (ref 0.3–1.2)
Total Protein: 5.7 g/dL — ABNORMAL LOW (ref 6.0–8.3)

## 2013-01-28 LAB — GLUCOSE, CAPILLARY: Glucose-Capillary: 118 mg/dL — ABNORMAL HIGH (ref 70–99)

## 2013-01-28 LAB — TSH: TSH: 0.012 u[IU]/mL — ABNORMAL LOW (ref 0.350–4.500)

## 2013-01-28 LAB — POCT GLYCOSYLATED HEMOGLOBIN (HGB A1C): Hemoglobin A1C: 6.2

## 2013-01-28 MED ORDER — LISINOPRIL-HYDROCHLOROTHIAZIDE 20-12.5 MG PO TABS: 1.0000 | ORAL_TABLET | Freq: Every day | ORAL | Status: DC

## 2013-01-28 MED ORDER — ATORVASTATIN CALCIUM 40 MG PO TABS: 40.0000 mg | ORAL_TABLET | Freq: Every day | ORAL | Status: DC

## 2013-01-28 NOTE — Assessment & Plan Note (Signed)
Health Maintenance  Topic Date Due  . Ophthalmology Exam  08/19/2012  . Tetanus/tdap  07/31/2013  . Foot Exam  04/16/2013  . Hemoglobin A1c  04/30/2013  . Influenza Vaccine  05/31/2013  . Lipid Panel  07/02/2013  . Mammogram  08/11/2013  . Colonoscopy  01/10/2014  . Pneumococcal Polysaccharide Vaccine Age 77 And Over  Addressed  . Zostavax  Addressed    Assessment:  Procedures due: Eye exam  Labs due: A1c  Immunizations due: Due for all of her immunizations, including PCP, influenza, T. dap, shingles.  Plan:  Continues to refuse all immunizations.   Continue to readdress at each visit.  A1c today.

## 2013-01-28 NOTE — Assessment & Plan Note (Signed)
Pertinent Labs: Liver Function Tests:    Component Value Date/Time   AST 20 07/02/2012 1450   ALT 21 07/02/2012 1450   ALKPHOS 66 07/02/2012 1450   BILITOT 0.6 07/02/2012 1450   PROT 6.5 07/02/2012 1450   ALBUMIN 4.1 07/02/2012 1450    Lipid Panel:     Component Value Date/Time   CHOL 155 07/02/2012 1450   TRIG 80 07/02/2012 1450   HDL 62 07/02/2012 1450   CHOLHDL 2.5 07/02/2012 1450   VLDL 16 07/02/2012 1450   LDLCALC 77 07/02/2012 1450     Assessment: Goal LDL (per ATP guidelines): < 100 mg/dL  Disease Control: controlled  Progress toward goals: at goal  Barriers to meeting goals: adverse effects of medications and therefore, she has stopped her Lipitor since 08/2012.    She thinks she will be able to tolerate lower dose lipitor, as she has done in the past.     Plan:  Restart lipitor at 40mg  dosage (from prior 80mg  dosage)

## 2013-01-28 NOTE — Assessment & Plan Note (Addendum)
Pertinent Data Reviewed: Basic Metabolic Panel:    Component Value Date/Time   NA 140 07/02/2012 1450   K 4.1 07/02/2012 1450   CL 103 07/02/2012 1450   CO2 29 07/02/2012 1450   BUN 20 07/02/2012 1450   CREATININE 0.90 07/02/2012 1450   CREATININE 0.85 12/25/2011 1248   GLUCOSE 118* 07/02/2012 1450   CALCIUM 9.4 07/02/2012 1450     Ref. Range 07/02/2012  Microalb, Ur Latest Range: 0.00-1.89 mg/dL 59.59 (H)  MICROALB/CREAT RATIO Latest Range: 0.0-30.0 mg/g 474.4 (H)  Creatinine, Urine No range found 125.6    2D Echo (01/2008) - Overall left ventricular systolic function was normal. LVEF - 55% to 65 %. There were no left ventricular regional wall motion abnormalities. There was mild mitral annular calcification. The left atrium was mildly dilated. Prepared and Electronically Authenticated by: Kirk Ruths M.D.   Assessment: The patient reports 3 week history of worsening bilateral, symmetric, lower extremity swelling without associated shortness of breath - since using a salt bath for her feet. The cause of her lower extremity swelling is unclear at this point, may be related to her underlying peripheral vascular disease,  dependent edema, venous insufficiency secondary to age. However, she does have history of proteinuria the setting of diabetes and hypertension. Given that she is describing increased frothiness of her urine, lower extremity edema may be related to renal compromise. She otherwise has no pulmonary signs of volume overload.  Plan:      Will check urine microalb/cr, CMET, urinalysis.  Keep legs elevated as much as possible.   Will start hctz for BP control and fluid management, which further investigating contributing factors.

## 2013-01-28 NOTE — Assessment & Plan Note (Signed)
Pertinent Data: BP Readings from Last 3 Encounters:  01/28/13 183/64  11/05/12 140/65  09/10/12 [id number]    Basic Metabolic Panel:    Component Value Date/Time   NA 140 07/02/2012 1450   K 4.1 07/02/2012 1450   CL 103 07/02/2012 1450   CO2 29 07/02/2012 1450   BUN 20 07/02/2012 1450   CREATININE 0.90 07/02/2012 1450   CREATININE 0.85 12/25/2011 1248   GLUCOSE 118* 07/02/2012 1450   CALCIUM 9.4 07/02/2012 1450    Assessment: Disease Control: moderately elevated  Progress toward goals: deteriorated  Barriers to meeting goals: no barriers identified except still smoking    She has previously had some lightheadedness with HCTZ, although, she is willing to retrial this medication.  Specifically, I think that this medication will provide improved BP control, while also addressing her lower extremity edema.  She was previously supposed to start amlodipine 2.5 mg daily during our last visit together in December 2013. However, the patient never started this medication. Given her lower extremity edema, I will not start this today.  Patient is compliant all of the time with prescribed medications.   Plan:  Stop lisinopril 40 mg daily.  Start lisinopril-hydrochlorothiazide 20-12.5 mg daily  Given prior intolerance, pt is recommended to monitor for side effects.  Encouraged to continue working towards smoking cessation.  Educational resources provided:    Self management tools provided:  Handout

## 2013-01-28 NOTE — Assessment & Plan Note (Signed)
Pertinent Data:  CT Chest without contrast (12/2007) - No acute process in the chest or suspicious lung nodule. 2. Single mildly enlarged low right paratracheal lymph node, likely reactive. Consider follow-up chest CT at 3 months.  Assessment: This above mentioned lung nodule has not been followed up. Given the patient's significant tobacco abuse, it will be important to continue to monitor for any changes. The patient denies any pulmonary symptoms at present - but does confirm unintentional weight loss. Today, we again had a discussion about potential diagnoses of this nodule, if it were simply reactive, versus possible cancer in the setting of her tobacco abuse. The patient declines CT chest for continued evaluation, understanding that if this were cancer, earlier diagnosis would be to our favor and we are taking a risk by continuing to defer.  Plan:      Defer CT chest without today, however, will need to reevaluate at each visit, as this will require monitoring.

## 2013-01-28 NOTE — Patient Instructions (Signed)
General Instructions:  Please follow-up at the clinic in 1 month, at which time we will reevaluate your blood pressure and diabetes - OR, please follow-up in the clinic sooner if needed.  There have been changes in your medications:  DECREASE Lipitor to 40mg  daily for cholesterol  STOP Lisinopril  START Lisinopril-Hydrochlorothiazde 20-12.5mg  once daily.  You are getting labs today, if they are abnormal I will give you a call.   If you have been started on new medication(s), and you develop symptoms concerning for allergic reaction, including, but not limited to, throat closing, tongue swelling, rash, please stop the medication immediately and call the clinic at [phone], and go to the ER.  If you are diabetic, please bring your meter to your next visit.  If symptoms worsen, or new symptoms arise, please call the clinic or go to the ER.  PLEASE BRING ALL OF YOUR MEDICATIONS  IN A BAG TO YOUR NEXT APPOINTMENT   Treatment Goals:  Goals (1 Years of Data) as of 01/28/13         As of Today 11/05/12 07/02/12 04/16/12 11/11/11     Result Component    . HEMOGLOBIN A1C < 8  6.2 8.1 6.2 6.5 7.1    . LDL CALC < 100    77 168       Progress Toward Treatment Goals:  Treatment Goal 09/10/2012  Hemoglobin A1C at goal  Blood pressure unchanged  Stop smoking smoking less    Self Care Goals & Plans:  Self Care Goal 11/05/2012  Manage my medications take my medicines as prescribed; bring my medications to every visit; refill my medications on time  Monitor my health keep track of my blood pressure  Eat healthy foods eat baked foods instead of fried foods; eat foods that are low in salt; drink diet soda or water instead of juice or soda; eat more vegetables; eat fruit for snacks and desserts  Be physically active find an activity I enjoy    Home Blood Glucose Monitoring 09/10/2012  Check my blood sugar (No Data)    LIFESTYLE TIPS TO HELP WITH YOUR BLOOD PRESSURE  CONTROL  ________________________________________________________________________  DASH DIET:  The DASH diet stands for "Dietary Approaches to Stop Hypertension." It is a healthy eating plan that has been shown to reduce high blood pressure (hypertension) in as little as 14 days, while also possibly providing other significant health benefits. These other health benefits include reducing the risk of breast cancer after menopause and reducing the risk of type 2 diabetes, heart disease, colon cancer, and stroke. Health benefits also include weight loss and slowing kidney failure in patients with chronic kidney disease.   Diet guidelines: Limit salt (sodium). Your diet should contain less than 1500 mg of sodium daily.  Limit refined or processed carbohydrates. Your diet should include mostly whole grains. Desserts and added sugars should be used sparingly.  Include small amounts of heart-healthy fats. These types of fats include nuts, oils, and tub margarine. Limit saturated and trans fats. These fats have been shown to be harmful in the body.   Choosing Foods: The following food groups are based on a 2000 calorie diet. See your Registered Dietitian for individual calorie needs.  Grains and Grain Products (6 to 8 servings daily)  Eat More Often: Whole-wheat bread, brown rice, whole-grain or wheat pasta, quinoa, popcorn without added fat or salt (air popped).  Eat Less Often: White bread, white pasta, white rice, cornbread.  Vegetables (4 to 5 servings  daily)  Eat More Often: Fresh, frozen, and canned vegetables. Vegetables may be raw, steamed, roasted, or grilled with a minimal amount of fat.  Eat Less Often/Avoid: Creamed or fried vegetables. Vegetables in a cheese sauce.  Fruit (4 to 5 servings daily)  Eat More Often: All fresh, canned (in natural juice), or frozen fruits. Dried fruits without added sugar. One hundred percent fruit juice ( cup [237 mL] daily).  Eat Less Often: Dried fruits  with added sugar. Canned fruit in light or heavy syrup.  [employer], Fish, and Poultry (2 servings or less daily. One serving is 3 to 4 oz [85-114 g]).  Eat More Often: Ninety percent or leaner ground beef, tenderloin, sirloin. Round cuts of beef, chicken breast, [place] breast. All fish. Grill, bake, or broil your meat. Nothing should be fried.  Eat Less Often/Avoid: Fatty cuts of meat, [place], or chicken leg, thigh, or wing. Fried cuts of meat or fish.  Dairy (2 to 3 servings)  Eat More Often: Low-fat or fat-free milk, low-fat plain or light yogurt, reduced-fat or part-skim cheese.  Eat Less Often/Avoid: Milk (whole, 2%, skim, or chocolate). Whole milk yogurt. Full-fat cheeses.  Nuts, Seeds, and Legumes (4 to 5 servings per week)  Eat More Often: All without added salt.  Eat Less Often/Avoid: Salted nuts and seeds, canned beans with added salt.  Fats and Sweets (limited)  Eat More Often: Vegetable oils, tub margarines without trans fats, sugar-free gelatin. Mayonnaise and salad dressings.  Eat Less Often/Avoid: Coconut oils, palm oils, butter, stick margarine, cream, half and half, cookies, candy, pie.   ________________________________________________________________________  Smoking Cessation Tips 1-800-QUIT-NOW  This document explains the best ways for you to quit smoking and new treatments to help. It lists new medicines that can double or triple your chances of quitting and quitting for good. It also considers ways to avoid relapses and concerns you may have about quitting, including weight gain.   Nicotine: A Powerful Addiction If you have tried to quit smoking, you know how hard it can be. It is hard because nicotine is a very addictive drug. For some people, it can be as addictive as heroin or cocaine. Usually, people make 2 or 3 tries, or more, before finally being able to quit. Each time you try to quit, you can learn about what helps and what hurts. Quitting takes hard work and a  lot of effort, but you can quit smoking.   Quitting smoking is one of the most important things you will ever do You will live longer, feel better, and live better.  The impact on your body of quitting smoking is felt almost immediately:   Five keys to quitting: Studies have shown that these 5 steps will help you quit smoking and quit for good. You have the best chances of quitting if you use them together:   1. GET READY  Set a quit date.  Change your environment.  Get rid of ALL cigarettes, ashtrays, matches, and lighters in your home, car, and place of work.  Do not let people smoke in your home.  Review your past attempts to quit. Think about what worked and what did not.  Once you quit, do not smoke. NOT EVEN A PUFF!   2. GET SUPPORT AND ENCOURAGEMENT  Tell your family, friends, and coworkers that you are going to quit and need their support. Ask them not to smoke around you.  Get individual, group, or telephone counseling and support.  Many smokers find  one or more of the many self-help books available useful in helping them quit and stay off tobacco.   3. LEARN NEW SKILLS AND BEHAVIORS  Try to distract yourself from urges to smoke. Talk to someone, go for a walk, or occupy your time with a task.  When you first try to quit, change your routine. Take a different route to work. Drink tea instead of coffee. Eat breakfast in a different place.  Do something to reduce your stress. Take a hot bath, exercise, or read a book.  Plan something enjoyable to do every day. Reward yourself for not smoking.  Explore interactive web-based programs that specialize in helping you quit.   4. GET MEDICINE AND USE IT CORRECTLY .  Medicines can help you stop smoking and decrease the urge to smoke. Combining medicine with the above behavioral methods and support can quadruple your chances of successfully quitting smoking.  Talk with your doctor about these options.  5. BE PREPARED FOR RELAPSE OR  DIFFICULT SITUATIONS  Most relapses occur within the first 3 months after quitting. Do not be discouraged if you start smoking again. Remember, most people try several times before they finally quit.  You may have symptoms of withdrawal because your body is used to nicotine. You may crave cigarettes, be irritable, feel very hungry, cough often, get headaches, or have difficulty concentrating.  The withdrawal symptoms are only temporary. They are strongest when you first quit, but they will go away within 10 to 14 days.   Quitting takes hard work and a lot of effort, but you can quit smoking.   FOR MORE INFORMATION  Smokefree.gov (Inrails.tn) provides free, accurate, evidence-based information and professional assistance to help support the immediate and long-term needs of people trying to quit smoking.  Document Released: 09/10/2001 Document Re-Released: 03/06/2010  [facility] Patient Information 2011 [place].

## 2013-01-28 NOTE — Assessment & Plan Note (Signed)
Pertinent Labs: Lab Results  Component Value Date   HGBA1C 6.2 01/28/2013   HGBA1C 5.9 06/18/2010   CREATININE 0.90 07/02/2012   CREATININE 0.85 12/25/2011   MICROALBUR 59.59* 07/02/2012   MICRALBCREAT 474.4* 07/02/2012   CHOL 155 07/02/2012   HDL 62 07/02/2012   TRIG 80 07/02/2012    Assessment: Disease Control: good control (HgbA1C at goal)  Progress toward goals: at goal  Barriers to meeting goals: no barriers identified    Diet controlled.  Patient was concerned about her potential need to start medication for her diabetes again (previously on glipizide, which was stopped secondary to too tight BG control) because of A1c of 8.1 during last visit. She was explained that today, her BG control is improved. Furthermore, that the risks of adding medication at this point much outweigh the risk of hypoglycemia without known mortality benefit.  She was in agreement to defer therapy at this point.  Of note, she has prior GI intolerance to Metformin.   Plan: Glucometer log was not reviewed today, as pt did not have glucometer available for review.   Continue diet control efforts  reminded to bring blood glucose meter & log to each visit  Educational resources provided:    Self management tools provided:    Home glucose monitoring recommendation: once a day before breakfast

## 2013-01-28 NOTE — Assessment & Plan Note (Signed)
  Assessment: 1. The patient was counseled on the dangers of tobacco use, which include, but are not limited to cardiovascular disease, increased cancer risk of multiple types of cancer, COPD, peripheral vascular disease, strokes. 2. She was also counseled on the benefits of smoking cessation. 3. Progress toward smoking cessation:  smoking less 4. Barriers to progress toward smoking cessation:  none  Plan: 1. Instruction/counseling given: The patient was firmly advised to quit and referred to a tobacco cessation program.   2. We also reviewed strategies to maximize success, including:  Removing cigarettes and smoking materials from environment  Stress management  Substitution of other forms of reinforcement Support of family/friends.  Selecting a quit date. 3. Educational resources provided:  Handout 4. Self management tools provided:   5. Medications to assist with smoking cessation: None 6. Patient agreed to the following self-care plans for smoking cessation:  continue to work towards decreasing tobacco use.

## 2013-01-28 NOTE — Progress Notes (Signed)
Patient: Gina [name]   MRN: [id number]  DOB: 29-Nov-1928  PCP: Annamarie Dawley, DO   Subjective:    CC: Follow-up, Medication Refill and Diabetes   HPI: [name] is a 79 y.o. female with a PMHx as outlined below, who presented to clinic today for the following:  1) HTN - Patient does not check blood pressure regularly at home. Currently taking Lisinopril 40mg  daily, never started the amlodipine that was prescribed in 08/2012. Patient misses doses 0 x per week on average. She does continue to smoke, although trying to smoke less. denies headaches, dizziness, lightheadedness, chest pain, shortness of breath.  does request refills today.   2) Tobacco abuse - patient continues to smoke 1/3 cigarettes per day and has done so for the past 66 years. There  have been prior attempts to quit - without successes. She has previously tried Chantix and found it to be helpful, but is no longer on this medication and does not want to resume. Barriers to quit (if any) include: none  3) Pulmonary nodule - Patient was noted to have a single mildly enlarged low right paratracheal LN per CT chest in 12/2007 that has not had the recommended 3 month follow-up. This was discussed with the patient during her visit in 08/2012, however, she declined followup. Patient denies shortness of breath, chest pain, night sweats, generalized malaise. Confirms unintentional weight loss.  4) HLD - currently taking no medications - she was previously on Lipitor and was tolerating it well - but then during clinic visit in 10/2012, pt apparently thought she was having reaction and sensation that she was going to faint. Therefore, she stopped the medication herself, but is willing to try a lower dose of the same medication. denies chest pain, difficulty breathing, palpitations, tachycardia, and muscle pains. does request refills today.   5) DM2, controlled - diet controlled Lab Results  Component Value Date   HGBA1C 6.2 01/28/2013   Patient checking blood sugars 2 times a week. Currently taking no medications. 0 hypoglycemic episodes since last visit. Denies assisted hypoglycemia or recently hospitalizations for either hyper or hypoglycemia. denies polyuria, polydipsia, nausea, vomiting, diarrhea.  In regards to diabetic complications:  Microvascular complications: Confirms: nephropathy, retinopathy and peripheral neuropathy ; Denies autonomic neuropathy.  Macrovascular complications: Confirms: peripheral vascular disease ; Denies cardiovascular disease and cerebrovascular disease.   Important diabetic medications: Is patient on aspirin? Yes Is patient on a statin? Not right now, has stopped her lipitor because of sx of lightheadedness Is patient on an ACE-I/ ARB? Yes  6) Weight loss - patient indicates several month history of increasing weight lossthat she has decreased appetite, sometimes light days and some days will eat heavily. No symptoms of night sweats, generalized malaise. States she has a history of thyroid goiter from childhood   7) Urinary complaint - Patient describes a 3 month history of foaminess of urine and occasional concentrated urine.. Otherwise, the patient denies abdominal pain, chills, hematuria, urinary frequency, urinary incontinence, urinary urgency, vaginal discharge, vaginal itching and vomiting.  8) LE edema - patient indicates a 3 week history of increasing lower extremity edema that is bilateral, equal. No leg trauma, erythema, skin tears. No worsening shortness of breath, orthopnea, PND.    Review of Systems: Per HPI.   Current Outpatient Medications: Medication Sig  . aspirin 81 MG tablet Take 1 tablet (81 mg total) by mouth daily.  Marland Kitchen lisinopril (PRINIVIL,ZESTRIL) 40 MG tablet Take 1 tablet (40 mg total)  by mouth daily.  . dorzolamide-timolol (COSOPT) 22.3-6.8 MG/ML ophthalmic solution   . prednisoLONE acetate (PRED FORTE) 1 % ophthalmic suspension Place 1 drop  into the right eye Every 2 hours.  . sennosides-docusate sodium (SENOKOT-S) 8.6-50 MG tablet Take 1 tablet by mouth 2 (two) times daily as needed for constipation.    Allergies  Allergen Reactions  . Metformin And Related Nausea Only    And diarrhea  . Simvastatin Other (See Comments)    Dizziness. Well tolerates pravastatin.    Past Medical History  Diagnosis Date  . Intraductal carcinoma 06/2003    Of right breast. s/p right partial mastectomy. // Followed by Dr. Marylene Buerger  . PVD (peripheral vascular disease)     S/P BL femoral-popliteal bypass surgery 03/2001 (right) and 01/2003 (left)  . Diabetes mellitus, type 2     Well controlled  . Hypertension   . Hyperlipidemia   . TB lung, latent     Treated with INH in 11/2007    Objective:    Physical Exam: Filed Vitals:   01/28/13 1323  BP: 170/67  Pulse: 70  Temp: 98.7 F (37.1 C)     General: Vital signs reviewed and noted. Well-developed, well-nourished, in no acute distress; alert, appropriate and cooperative throughout examination.  Head: Normocephalic, atraumatic.  Lungs:  Normal respiratory effort. Clear to auscultation BL without crackles or wheezes.  Heart: RRR. S1 and S2 normal without gallop, murmur, or rubs.  Abdomen:  BS normoactive. Soft, Nondistended, non-tender.  No masses or organomegaly.  Extremities: 1-2 + pretibial edema. To knee. Most extensive over bilateral feet. DP pulses 2+ equal and intact BL.     Assessment/ Plan:   The patient's case and plan of care was discussed with attending physician, Dr. Milta Deiters.

## 2013-01-28 NOTE — Assessment & Plan Note (Signed)
Pertinent Data Reviewed: Vitals - 1 value per visit 01/28/2013 11/05/2012 09/10/2012 07/14/2012  Weight (lb) 139 143.4 151.2 146.4   Vitals - 1 value per visit 07/02/2012 04/16/2012 02/06/2012 12/25/2011  Weight (lb) 148.8 147.5 150.4 165    Assessment: The patient has had approximately a 10 pound weight loss since December 2013 - the cause of which is unclear. May be related to normal variation, as seems to have gained weight during the last year, and then with subsequent loss. Alternatively, may be related to decreased appetite, and thereby decrease food intake - which is corroborated by the pt. She also has hx of thyroid goiter per report today, however, no TSH within the system - therefore, will investigate this further. Lastly, there is some concern about possible unidentified malignancy - particularly given > 65 pack year tobacco use with ongoing smoking and pulmonary nodule that has not been followed since 2009 per pt request.  Plan:      Check TSH, CMET today.  Continue discussions regarding CT chest followup.

## 2013-01-29 LAB — MICROALBUMIN / CREATININE URINE RATIO
Creatinine, Urine: 291 mg/dL
Microalb Creat Ratio: 697.3 mg/g — ABNORMAL HIGH (ref 0.0–30.0)
Microalb, Ur: 202.91 mg/dL — ABNORMAL HIGH (ref 0.00–1.89)

## 2013-01-29 LAB — URINALYSIS, ROUTINE W REFLEX MICROSCOPIC
Glucose, UA: NEGATIVE mg/dL
Hgb urine dipstick: NEGATIVE
Leukocytes, UA: NEGATIVE
Nitrite: NEGATIVE
Protein, ur: 100 mg/dL — AB
Specific Gravity, Urine: 1.025 (ref 1.005–1.030)
Urobilinogen, UA: 1 mg/dL (ref 0.0–1.0)
pH: 5 (ref 5.0–8.0)

## 2013-01-29 LAB — URINALYSIS, MICROSCOPIC ONLY: Crystals: NONE SEEN

## 2013-01-29 LAB — T4, FREE: Free T4: 1.51 ng/dL (ref 0.80–1.80)

## 2013-01-29 NOTE — Addendum Note (Signed)
Addended by: Annamarie Dawley on: 01/29/2013 09:40 AM   Modules accepted: Orders

## 2013-01-31 ENCOUNTER — Encounter (HOSPITAL_COMMUNITY): Payer: Self-pay | Admitting: Family Medicine

## 2013-01-31 ENCOUNTER — Emergency Department (HOSPITAL_COMMUNITY)
Admission: EM | Admit: 2013-01-31 | Discharge: 2013-01-31 | Disposition: A | Discharge status: Home / Self Care | Payer: Medicare Other | Attending: Emergency Medicine | Admitting: Emergency Medicine

## 2013-01-31 ENCOUNTER — Emergency Department (HOSPITAL_COMMUNITY): Payer: Medicare Other

## 2013-01-31 DIAGNOSIS — I10 Essential (primary) hypertension: Secondary | ICD-10-CM | POA: Insufficient documentation

## 2013-01-31 DIAGNOSIS — F17200 Nicotine dependence, unspecified, uncomplicated: Secondary | ICD-10-CM | POA: Insufficient documentation

## 2013-01-31 DIAGNOSIS — E785 Hyperlipidemia, unspecified: Secondary | ICD-10-CM | POA: Insufficient documentation

## 2013-01-31 DIAGNOSIS — Z7982 Long term (current) use of aspirin: Secondary | ICD-10-CM | POA: Insufficient documentation

## 2013-01-31 DIAGNOSIS — Y9210 Unspecified residential institution as the place of occurrence of the external cause: Secondary | ICD-10-CM | POA: Insufficient documentation

## 2013-01-31 DIAGNOSIS — Z8669 Personal history of other diseases of the nervous system and sense organs: Secondary | ICD-10-CM | POA: Insufficient documentation

## 2013-01-31 DIAGNOSIS — W108XXA Fall (on) (from) other stairs and steps, initial encounter: Secondary | ICD-10-CM | POA: Insufficient documentation

## 2013-01-31 DIAGNOSIS — S79919A Unspecified injury of unspecified hip, initial encounter: Secondary | ICD-10-CM | POA: Insufficient documentation

## 2013-01-31 DIAGNOSIS — M25552 Pain in left hip: Secondary | ICD-10-CM

## 2013-01-31 DIAGNOSIS — Z853 Personal history of malignant neoplasm of breast: Secondary | ICD-10-CM | POA: Insufficient documentation

## 2013-01-31 DIAGNOSIS — Z8611 Personal history of tuberculosis: Secondary | ICD-10-CM | POA: Insufficient documentation

## 2013-01-31 DIAGNOSIS — Y9301 Activity, walking, marching and hiking: Secondary | ICD-10-CM | POA: Insufficient documentation

## 2013-01-31 DIAGNOSIS — Z79899 Other long term (current) drug therapy: Secondary | ICD-10-CM | POA: Insufficient documentation

## 2013-01-31 DIAGNOSIS — E119 Type 2 diabetes mellitus without complications: Secondary | ICD-10-CM | POA: Insufficient documentation

## 2013-01-31 MED ORDER — LISINOPRIL-HYDROCHLOROTHIAZIDE 20-12.5 MG PO TABS
1.0000 | ORAL_TABLET | Freq: Once | ORAL | Status: DC
Start: 2013-01-31 — End: 2013-01-31

## 2013-01-31 MED ORDER — HYDROCHLOROTHIAZIDE 12.5 MG PO CAPS
12.5000 mg | ORAL_CAPSULE | ORAL | Status: AC
  Administered 2013-01-31: 12.5 mg via ORAL
  Filled 2013-01-31: qty 1

## 2013-01-31 MED ORDER — LISINOPRIL 20 MG PO TABS
20.0000 mg | ORAL_TABLET | ORAL | Status: AC
  Administered 2013-01-31: 20 mg via ORAL
  Filled 2013-01-31: qty 1

## 2013-01-31 MED ORDER — IBUPROFEN 400 MG PO TABS
600.0000 mg | ORAL_TABLET | Freq: Once | ORAL | Status: AC
  Administered 2013-01-31: 600 mg via ORAL
  Filled 2013-01-31: qty 1

## 2013-01-31 MED ORDER — LISINOPRIL 20 MG PO TABS: 20.0000 mg | ORAL_TABLET | Freq: Once | ORAL | Status: DC

## 2013-01-31 NOTE — ED Notes (Signed)
Per pt sts yesterday she was walking and her hip gave out on her and she fell to the ground. Pt walked back to triage room without difficulty. sts her thigh is hurting and sore.
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
## 2013-02-01 NOTE — ED Provider Notes (Signed)
Medical screening examination/treatment/procedure(s) were conducted as a shared visit with non-physician practitioner(s) and myself.  I personally evaluated the patient during the encounter  Gina [name] is a 79 y.o. female here s/p fall. Her L leg gave out yesterday and she sat on the floor. No head injury. Mild tenderness on thigh muscles on exam and nl ROM L hip. Xray showed no fracture. Neurovascular exam otherwise unremarkable. i think she likely has a muscle strain. I recommend motrin around the clock, ice, and cane. She has pain meds at home and able to ambulate. She was hypertensive and was given her PO meds. No signs of hypertensive emergency. Stable for d/c.    Wandra Arthurs, MD 02/01/13 [phone]

## 2013-02-02 NOTE — Progress Notes (Signed)
Case discussed with Dr. Guy Sandifer  (at time of visit, soon after the resident saw the patient).  We reviewed the resident's history and exam and pertinent patient test results.  I agree with the assessment, diagnosis, and plan of care documented in the resident's note.

## 2013-02-04 ENCOUNTER — Ambulatory Visit: Payer: Medicare Other | Admitting: Neurosurgery

## 2013-02-05 ENCOUNTER — Encounter (INDEPENDENT_AMBULATORY_CARE_PROVIDER_SITE_OTHER): Payer: Medicare Other | Admitting: *Deleted

## 2013-02-05 ENCOUNTER — Ambulatory Visit: Payer: Medicare Other | Admitting: Neurosurgery

## 2013-02-05 DIAGNOSIS — Z48812 Encounter for surgical aftercare following surgery on the circulatory system: Secondary | ICD-10-CM

## 2013-02-05 DIAGNOSIS — I739 Peripheral vascular disease, unspecified: Secondary | ICD-10-CM

## 2013-02-08 ENCOUNTER — Other Ambulatory Visit: Payer: Self-pay | Admitting: *Deleted

## 2013-02-08 DIAGNOSIS — Z48812 Encounter for surgical aftercare following surgery on the circulatory system: Secondary | ICD-10-CM

## 2013-02-08 DIAGNOSIS — I739 Peripheral vascular disease, unspecified: Secondary | ICD-10-CM

## 2013-02-11 ENCOUNTER — Encounter: Payer: Self-pay | Admitting: Vascular Surgery

## 2013-03-01 ENCOUNTER — Encounter: Payer: Self-pay | Admitting: *Deleted

## 2013-03-01 ENCOUNTER — Other Ambulatory Visit: Payer: Self-pay | Admitting: *Deleted

## 2013-03-01 DIAGNOSIS — M79609 Pain in unspecified limb: Secondary | ICD-10-CM

## 2013-03-01 NOTE — Progress Notes (Signed)
Patient dropped in the office today c/o hip pain. She was seen at the ED on 02-05-13 and xrays showed arthritic changes; they referred her to an orthopedic specialist. She came here instead; had vascular labs recently which showed a patent BPG. Dr. Kellie Simmering was informed and he said to send her to Dr. Sharol Given for followup on her hip pain.

## 2013-03-04 ENCOUNTER — Encounter: Payer: Self-pay | Admitting: Internal Medicine

## 2013-03-04 ENCOUNTER — Ambulatory Visit (INDEPENDENT_AMBULATORY_CARE_PROVIDER_SITE_OTHER): Payer: Medicare Other | Admitting: Internal Medicine

## 2013-03-04 VITALS — BP 160/63 | HR 83 | Temp 97.7°F | Ht 66.0 in | Wt 129.6 lb

## 2013-03-04 DIAGNOSIS — W19XXXA Unspecified fall, initial encounter: Secondary | ICD-10-CM

## 2013-03-04 DIAGNOSIS — R600 Localized edema: Secondary | ICD-10-CM

## 2013-03-04 DIAGNOSIS — I10 Essential (primary) hypertension: Secondary | ICD-10-CM

## 2013-03-04 DIAGNOSIS — R609 Edema, unspecified: Secondary | ICD-10-CM

## 2013-03-04 DIAGNOSIS — E041 Nontoxic single thyroid nodule: Secondary | ICD-10-CM

## 2013-03-04 DIAGNOSIS — E119 Type 2 diabetes mellitus without complications: Secondary | ICD-10-CM

## 2013-03-04 DIAGNOSIS — W19XXXD Unspecified fall, subsequent encounter: Secondary | ICD-10-CM

## 2013-03-04 DIAGNOSIS — R634 Abnormal weight loss: Secondary | ICD-10-CM

## 2013-03-04 DIAGNOSIS — E876 Hypokalemia: Secondary | ICD-10-CM

## 2013-03-04 LAB — GLUCOSE, CAPILLARY: Glucose-Capillary: 127 mg/dL — ABNORMAL HIGH (ref 70–99)

## 2013-03-04 MED ORDER — LISINOPRIL-HYDROCHLOROTHIAZIDE 20-12.5 MG PO TABS: 1.0000 | ORAL_TABLET | Freq: Every day | ORAL | Status: DC

## 2013-03-04 NOTE — Assessment & Plan Note (Signed)
Pertinent Data Reviewed: Basic Metabolic Panel:    Component Value Date/Time   NA 139 01/28/2013 1425   K 3.4* 01/28/2013 1425   CL 105 01/28/2013 1425   CO2 28 01/28/2013 1425   BUN 10 01/28/2013 1425   CREATININE 0.60 01/28/2013 1425   CREATININE 0.85 12/25/2011 1248   GLUCOSE 103* 01/28/2013 1425   CALCIUM 9.0 01/28/2013 1425     Ref. Range 07/02/2012  Microalb, Ur Latest Range: 0.00-1.89 mg/dL 59.59 (H)  MICROALB/CREAT RATIO Latest Range: 0.0-30.0 mg/g 474.4 (H)  Creatinine, Urine No range found 125.6    2D Echo (01/2008) - Overall left ventricular systolic function was normal. LVEF - 55% to 65 %. There were no left ventricular regional wall motion abnormalities. There was mild mitral annular calcification. The left atrium was mildly dilated. Prepared and Electronically Authenticated by: Kirk Ruths M.D.   Assessment: The patient recurrent lower extremity swelling without associated shortness of breath. This is now asymmetric, worse in LLE and with increased pain. The cause of her lower extremity swelling is unclear at this point, may be related to her underlying peripheral vascular disease,  dependent edema, venous insufficiency secondary to age. The asymmetric nature is somewhat concerning and given there is concern at this point about possible undiscovered malignancy (unexplained weight loss), I am more inclined to evaluate for DVT as well.  Plan:      Will get doppler US left LE  Keep legs elevated as much as possible.   Will start hctz for BP control and fluid management, which further investigating contributing factors.

## 2013-03-04 NOTE — Assessment & Plan Note (Addendum)
Pertinent Data Reviewed: Vitals - 1 value per visit 03/04/2013 01/28/2013 11/05/2012 09/10/2012 07/14/2012  Weight (lb) 129.6 139 143.4 151.2 146.4   Vitals - 1 value per visit 07/02/2012 04/16/2012 02/06/2012 12/25/2011  Weight (lb) 148.8 147.5 150.4 165    Thyroid function tests:  Ref. Range 01/28/2013  TSH Latest Range: 0.350-4.500 uIU/mL 0.012 (L)  Free T4 Latest Range: 0.80-1.80 ng/dL [id number]   Basic Metabolic Panel:    Component Value Date/Time   NA 139 01/28/2013 1425   K 3.4* 01/28/2013 1425   CL 105 01/28/2013 1425   CO2 28 01/28/2013 1425   BUN 10 01/28/2013 1425   CREATININE 0.60 01/28/2013 1425   CREATININE 0.85 12/25/2011 1248   GLUCOSE 103* 01/28/2013 1425   CALCIUM 9.0 01/28/2013 1425    Health maintenance exams:  Mammogram (07/2012) - BI-RADS CATEGORY 1: Negative. Original Report Authenticated By: Lillia Mountain, M.D.   Colonoscopy (12/2011) - 1) Two polyps - tubular adenoma and hyperplastic polyp.   Assessment: The patient has had approximately a 20 pound weight loss since December 2013 - the cause of which remains unclear. May be related to decreased appetite, and thereby decrease food intake - which is corroborated by the pt. She refuses supplementary drinks such as Ensure. She also is noted to have thyroid nodule and low TSH (although free T4 wnl), possibly indicative of subclinical hyperthyroidism - functionality of this thyroid nodule is not clear. Lastly, there is some concern about possible unidentified malignancy - particularly given > 65 pack year tobacco use with ongoing smoking and pulmonary nodule that has not been followed since 2009 per pt request. Pt continues to refuse CT chest for followup.  Plan:      Thyroid ultrasound, then may need Thyroid uptake/ scan.  Will ask pt to return for office visit to obtain CBC (unfortunately was not completed today) - in 2 weeks.   Consider dietician referral next visit.

## 2013-03-04 NOTE — Progress Notes (Signed)
Patient: Gina [name]   MRN: [id number]  DOB: March 14, 1929  PCP: Annamarie Dawley, DO   Subjective:    CC: F/U Visit   HPI: [name] is a 79 y.o. female with a PMHx as outlined below, who presented to clinic today for the following:  1) HTN - Patient does not check blood pressure regularly at home. Currently taking Lisinopril 40mg  daily, never started the lisinopril-hydrochlorothiazide that was recommended in 01/28/2013. Patient misses doses 0 x per week on average. She does continue to smoke, although trying to smoke less. denies headaches, dizziness, lightheadedness, chest pain, shortness of breath.  does request refills today.   2) Weight loss - patient indicates several month history of increasing weight lossthat she has decreased appetite, sometimes light days and some days will eat heavily. No symptoms of night sweats, generalized malaise. States she has a history of thyroid goiter from childhood. No fevers, chills, shortness of breath, chest pain, nausea, vomiting, abdominal pain, new rashes.  3) BL LE edema - patient indicates progressively worsening LE edema left > right. She does have hx of PVD requiring vascular surgery in the past. No leg trauma, erythema, skin tears. No worsening shortness of breath, orthopnea, PND.  4) Fall - [place]. Preceding sensation of left leg giving out without preceding syncope, palpitations, vision changes, lightheadedness, dizziness. Eventually went to the ED at the urging of her son, XR were unrevealing for fracture, showing only arthritis. No specific persistent joint pain, swelling, erythema over knee or hip. She has been seen by her vascular surgeon, who has referred to a specialist, she thinks maybe PT for continued evaluation. No recurrent falls.    Review of Systems: Per HPI.   Current Outpatient Medications: Medication Sig  . aspirin 81 MG tablet Take 81 mg by mouth  2 (two) times a week.  Marland Kitchen atorvastatin (LIPITOR) 40 MG tablet Take 1 tablet (40 mg total) by mouth daily. FOR CHOLESTEROL  . dorzolamide-timolol (COSOPT) 22.3-6.8 MG/ML ophthalmic solution Place 1 drop into the right eye daily.   . Lisinopril 40 mg tablet Take 1 tablet by mouth daily.  . prednisoLONE acetate (PRED FORTE) 1 % ophthalmic suspension Place 1 drop into the right eye daily.     Allergies: Allergies  Allergen Reactions  . Metformin And Related Nausea Only    And diarrhea  . Simvastatin Other (See Comments)    Dizziness. Well tolerates pravastatin.    Past Medical History  Diagnosis Date  . Intraductal carcinoma 06/2003    Of right breast. s/p right partial mastectomy. // Followed by Dr. Marylene Buerger  . PVD (peripheral vascular disease)     S/P BL femoral-popliteal bypass surgery 03/2001 (right) and 01/2003 (left)  . Diabetes mellitus, type 2     Well controlled  . Hypertension   . Hyperlipidemia   . TB lung, latent     Treated with INH in 11/2007    Objective:    Physical Exam: Filed Vitals:   03/04/13 1540  BP: 160/63  Pulse: 83  Temp: 97.7 F (36.5 C)     General: Vital signs reviewed and noted. Well-developed, well-nourished, in no acute distress; alert, appropriate and cooperative throughout examination.  Head: Normocephalic, atraumatic. No lymphadenopathy appreciated.  Thyroid - Left thyroid nodule noted per palpation, possible right nodule that seems partially substernal.   Lungs:  Normal respiratory effort. Clear to auscultation BL without crackles or wheezes.  Heart: RRR. S1 and S2 normal without gallop, murmur, or rubs.  Abdomen:  BS normoactive. Soft, Nondistended, non-tender.  No masses or organomegaly.  Extremities: 2-3 + pretibial edema. To knee, Left > right. DP pulses 2+ equal and intact BL.    Assessment/ Plan:   The patient's case and plan of care was discussed with attending physician, Dr. Dominic Pea.

## 2013-03-04 NOTE — Assessment & Plan Note (Addendum)
Pertinent Data: BP Readings from Last 3 Encounters:  03/04/13 160/63  01/31/13 196/71  01/28/13 [id number]    Basic Metabolic Panel:    Component Value Date/Time   NA 139 01/28/2013 1425   K 3.4* 01/28/2013 1425   CL 105 01/28/2013 1425   CO2 28 01/28/2013 1425   BUN 10 01/28/2013 1425   CREATININE 0.60 01/28/2013 1425   CREATININE 0.85 12/25/2011 1248   GLUCOSE 103* 01/28/2013 1425   CALCIUM 9.0 01/28/2013 1425    Assessment: Disease Control:   Uncontrolled  Progress toward goals:   Improved  Barriers to meeting goals: no barriers identified except still smoking    The patient brought in her medications bottles with her today showing Lisinopril 40mg  only.    Plan:  Stop lisinopril 40 mg daily.  Start lisinopril-hydrochlorothiazide 20-12.5 mg daily  Given prior intolerance, pt is recommended to monitor for side effects.  Encouraged to continue working towards smoking cessation.  Educational resources provided: brochure  Self management tools provided:  Handout   ADDENDUM TO PLAN AFTER LABS RESULTED: 03/05/2013, 11:03 AM  Pertinent Data Reviewed: Basic Metabolic Panel:    Component Value Date/Time   NA 142 03/04/2013 1700   K 3.2* 03/04/2013 1700   CL 106 03/04/2013 1700   CO2 29 03/04/2013 1700   BUN 12 03/04/2013 1700   CREATININE 0.62 03/04/2013 1700   CREATININE 0.85 12/25/2011 1248   GLUCOSE 107* 03/04/2013 1700   CALCIUM 9.3 03/04/2013 1700    Assessment: I was initially not able to reconcile worsening hypokalemia, except possible contribution of significant reduced oral intake described by the pt. I called her pharmacy, who indicate that she has filled the Lisinopril-HCTZ 20-12.5mg  on 5/5 and then the Lisinopril 40mg  on 5/6. Therefore, presumably, she is taking both medications - although she admits to only Lisinopril 40mg .   The additional diuretic effect likely accounting for her hypokalemia, in addition to contribution of reduced oral intake. There is obviously some medication  confusion and I am not clear if and how she is taking her medications.   Plan:  STOP Lisinopril and Lisinopril-HCTZ - I called her pharmacy and cancelled all RX (had not picked up yet for this month).  START triamterene-HCTZ.  Repeat BMET in 2 weeks.  If bp persistently elevated next visit - will consider readdition of low dose ACE-I given known proteinuria.  Will ask triage to call and inform pt of the above changes and to bring all medication bottles to next visit.

## 2013-03-04 NOTE — Assessment & Plan Note (Addendum)
Pertinent Data Reviewed:  XR Left Hip (01/31/2013) - No acute bony abnormality to the pelvis or left hip. Original Report Authenticated By: Markus Daft, M.D.  Assessment: Mechanical fall in early May, no specific residual pain. She is already set up to see either ortho or PT (she does not know which one). She may need additional cane or other supportive devices.   Plan:      No further imaging right now.  I have asked her to call and inform us if the specialist that she is seeing is not physical therapy - in which case, she will need an order for PT as well.

## 2013-03-04 NOTE — Patient Instructions (Signed)
General Instructions:  Please follow-up at the clinic in 1 month, at which time we will reevaluate your blood pressure and your weight loss - OR, please follow-up in the clinic sooner if needed.  There have been changes in your medications:  STOP Lisinopril 40mg  daily  START Lisinopril-HCTZ 20-12.5mg  daily for blood pressure.   Get your thyroid ultrasound as soon as you can.  Get your leg ultrasound as soon as you can.  You are getting labs today, if they are abnormal I will give you a call.   If you have been started on new medication(s), and you develop symptoms concerning for allergic reaction, including, but not limited to, throat closing, tongue swelling, rash, please stop the medication immediately and call the clinic at [phone], and go to the ER.  If symptoms worsen, or new symptoms arise, please call the clinic or go to the ER.  PLEASE BRING ALL OF YOUR MEDICATIONS  IN A BAG TO YOUR NEXT APPOINTMENT   Treatment Goals:  Goals (1 Years of Data) as of 03/04/13         01/28/13 11/05/12 07/02/12 04/16/12 11/11/11     Result Component    . HEMOGLOBIN A1C < 8  6.2 8.1 6.2 6.5 7.1    . LDL CALC < 100    77 168       Progress Toward Treatment Goals:  Treatment Goal 01/28/2013  Hemoglobin A1C at goal  Blood pressure deteriorated  Stop smoking smoking less    Self Care Goals & Plans:  Self Care Goal 03/04/2013  Manage my medications take my medicines as prescribed; bring my medications to every visit  Monitor my health -  Eat healthy foods -  Be physically active -  Stop smoking go to the [employer] website (https://sc[employer].info/)  Prevent falls use home fall prevention checklist to improve safety    Home Blood Glucose Monitoring 01/28/2013  Check my blood sugar once a day  When to check my blood sugar before breakfast

## 2013-03-04 NOTE — Addendum Note (Signed)
Addended by: Annamarie Dawley on: 03/04/2013 07:06 PM   Modules accepted: Orders

## 2013-03-05 LAB — MAGNESIUM: Magnesium: 1.7 mg/dL (ref 1.5–2.5)

## 2013-03-05 LAB — BASIC METABOLIC PANEL
BUN: 12 mg/dL (ref 6–23)
CO2: 29 mEq/L (ref 19–32)
Calcium: 9.3 mg/dL (ref 8.4–10.5)
Chloride: 106 mEq/L (ref 96–112)
Creat: 0.62 mg/dL (ref 0.50–1.10)
Glucose, Bld: 107 mg/dL — ABNORMAL HIGH (ref 70–99)
Potassium: 3.2 mEq/L — ABNORMAL LOW (ref 3.5–5.3)
Sodium: 142 mEq/L (ref 135–145)

## 2013-03-05 MED ORDER — TRIAMTERENE-HCTZ 37.5-25 MG PO CAPS: 1.0000 | ORAL_CAPSULE | Freq: Every day | ORAL | Status: DC

## 2013-03-05 NOTE — Addendum Note (Signed)
Addended by: Annamarie Dawley on: 03/05/2013 11:41 AM   Modules accepted: Orders, Medications

## 2013-03-05 NOTE — Progress Notes (Signed)
Quick Note:  Likely low K 2/2 reduced oral intake, and pt apparently was taking Lisinopril-HCTZ + Lisinopril 40mg  per her pharmacy records (although not reported). Will change to triamterene-HCTZ alone. ______

## 2013-03-08 NOTE — Progress Notes (Signed)
Case discussed with Dr. Reynolds immediately after the resident saw the patient.  We reviewed the residens history and exam and pertinent patient test results.  I agree with the assessment, diagnosis and plan of care documented in the resident's note. 

## 2013-03-10 ENCOUNTER — Other Ambulatory Visit (INDEPENDENT_AMBULATORY_CARE_PROVIDER_SITE_OTHER): Payer: Medicare Other

## 2013-03-10 ENCOUNTER — Ambulatory Visit (HOSPITAL_COMMUNITY): Payer: Medicare Other

## 2013-03-10 ENCOUNTER — Ambulatory Visit (HOSPITAL_COMMUNITY)
Admission: RE | Admit: 2013-03-10 | Discharge: 2013-03-10 | Disposition: A | Discharge status: Home / Self Care | Payer: Medicare Other | Source: Ambulatory Visit | Attending: Internal Medicine | Admitting: Internal Medicine

## 2013-03-10 DIAGNOSIS — M7989 Other specified soft tissue disorders: Secondary | ICD-10-CM | POA: Insufficient documentation

## 2013-03-10 DIAGNOSIS — R600 Localized edema: Secondary | ICD-10-CM

## 2013-03-10 DIAGNOSIS — R634 Abnormal weight loss: Secondary | ICD-10-CM

## 2013-03-10 LAB — CBC WITH DIFFERENTIAL/PLATELET
Basophils Absolute: 0 10*3/uL (ref 0.0–0.1)
Basophils Relative: 1 % (ref 0–1)
Eosinophils Absolute: 0.1 10*3/uL (ref 0.0–0.7)
Eosinophils Relative: 2 % (ref 0–5)
HCT: 32.6 % — ABNORMAL LOW (ref 36.0–46.0)
Hemoglobin: 11.5 g/dL — ABNORMAL LOW (ref 12.0–15.0)
Lymphocytes Relative: 34 % (ref 12–46)
Lymphs Abs: 1.6 10*3/uL (ref 0.7–4.0)
MCH: 31.3 pg (ref 26.0–34.0)
MCHC: 35.3 g/dL (ref 30.0–36.0)
MCV: 88.6 fL (ref 78.0–100.0)
Monocytes Absolute: 0.3 10*3/uL (ref 0.1–1.0)
Monocytes Relative: 7 % (ref 3–12)
Neutro Abs: 2.6 10*3/uL (ref 1.7–7.7)
Neutrophils Relative %: 56 % (ref 43–77)
Platelets: 228 10*3/uL (ref 150–400)
RBC: 3.68 MIL/uL — ABNORMAL LOW (ref 3.87–5.11)
RDW: 12.1 % (ref 11.5–15.5)
WBC: 4.7 10*3/uL (ref 4.0–10.5)

## 2013-03-10 NOTE — Progress Notes (Signed)
VASCULAR LAB PRELIMINARY  PRELIMINARY  PRELIMINARY  PRELIMINARY  Left lower extremity venous duplex completed.    Preliminary report:  Left:  No evidence of DVT, superficial thrombosis, or Baker's cyst.  Presli Fanguy, RVT 03/10/2013, 10:40 AM

## 2013-03-16 ENCOUNTER — Ambulatory Visit (HOSPITAL_COMMUNITY)
Admission: RE | Admit: 2013-03-16 | Discharge: 2013-03-16 | Disposition: A | Discharge status: Home / Self Care | Payer: Medicare Other | Source: Ambulatory Visit | Attending: Internal Medicine | Admitting: Internal Medicine

## 2013-03-16 DIAGNOSIS — E041 Nontoxic single thyroid nodule: Secondary | ICD-10-CM | POA: Insufficient documentation

## 2013-03-16 DIAGNOSIS — R799 Abnormal finding of blood chemistry, unspecified: Secondary | ICD-10-CM | POA: Insufficient documentation

## 2013-03-30 ENCOUNTER — Encounter: Payer: Self-pay | Admitting: Internal Medicine

## 2013-03-30 ENCOUNTER — Ambulatory Visit (INDEPENDENT_AMBULATORY_CARE_PROVIDER_SITE_OTHER): Payer: Medicare Other | Admitting: Internal Medicine

## 2013-03-30 VITALS — BP 166/73 | HR 83 | Temp 97.9°F | Ht 66.0 in | Wt 116.0 lb

## 2013-03-30 DIAGNOSIS — R634 Abnormal weight loss: Secondary | ICD-10-CM

## 2013-03-30 DIAGNOSIS — R1012 Left upper quadrant pain: Secondary | ICD-10-CM

## 2013-03-30 MED ORDER — LISINOPRIL 5 MG PO TABS: 5.0000 mg | ORAL_TABLET | Freq: Every day | ORAL | Status: DC

## 2013-03-30 NOTE — Patient Instructions (Signed)
Come back in 2 to 4 weeks. I will call you with your ct results. I am starting a new blood pressure medication, start it once a day.

## 2013-03-30 NOTE — Progress Notes (Signed)
[name], female   DOB: 02-08-1929, 79 y.o.   MRN: [id number]   Subjective:   [name] female   DOB: 1928/10/13 79 y.o.   MRN: [id number]  HPI: Gina [name] is a 79 y.o.   15 y o [place] American female presents with ; Abdominal pain, started 4 weeks ago, rates pain as 10 out of 10, constant, aggravated by meals, No known relieving factors, present in right upper quadrant and radiates to lower abdomen. Associated dark stools , recently, associated with anorexia and  Weight loss. No hx of nausea or vomiting or loss of appettite,no yellowness of the eyes or body, no previous hx of abdominal pain, no blood in the urine or dysuria. Premorbid bowel habits- constipation, usually took laxative to have BM, cant remember The name of the medication. Weight loss started about a year ago 11/27/2011 was 165 pounds, currently 116 pounds today. l   Ros- No headaches,or seizures, does have problem seeing out of the left eye, seeing an ophthalmologist. She got over the counter glasses which she reports helps with her vision. No mouth ulcers or difficulty swallowing.  No dizziness or fainting spells No neck stiffness or pain. Chest- no difficulty breathing, cough or chest pain Reports swelling of both lower extremities, no lower extremity weakness.   Past Medical History  Diagnosis Date  . Intraductal carcinoma 06/2003    Of right breast. s/p right partial mastectomy. // Followed by Dr. Marylene Buerger  . PVD (peripheral vascular disease)     S/P BL femoral-popliteal bypass surgery 03/2001 (right) and 01/2003 (left)  . Diabetes mellitus, type 2     Well controlled  . Hypertension   . Hyperlipidemia   . TB lung, latent     Treated with INH in 11/2007   Current Outpatient Prescriptions  Medication Sig Dispense Refill  . aspirin 81 MG tablet Take 81 mg by mouth 2 (two) times a week.      Marland Kitchen atorvastatin (LIPITOR) 40 MG tablet Take 1 tablet (40 mg total) by  mouth daily. FOR CHOLESTEROL  30 tablet  3  . dorzolamide-timolol (COSOPT) 22.3-6.8 MG/ML ophthalmic solution Place 1 drop into the right eye daily.       . prednisoLONE acetate (PRED FORTE) 1 % ophthalmic suspension Place 1 drop into the right eye daily.       Marland Kitchen triamterene-hydrochlorothiazide (DYAZIDE) 37.5-25 MG per capsule Take 1 each (1 capsule total) by mouth daily.  30 capsule  1   No current facility-administered medications for this visit.   Family History  Problem Relation Age of Onset  . Cancer Daughter 88    uterine cancer  . Cancer Father    History   Social History  . Marital Status: Widowed    Spouse Name: N/A    Number of Children: N/A  . Years of Education: N/A   Occupational History  . nurse     Private duty nurse   Social History Main Topics  . Smoking status: Current Every Day Smoker -- 0.20 packs/day for 64 years    Types: Cigarettes  . Smokeless tobacco: Never Used     Comment: previously smoked 1 ppd x 64 yrs; cutting back.  . Alcohol Use: No  . Drug Use: No  . Sexually Active: None   Other Topics Concern  . None   Social History Narrative  . None    Objective:  Physical Exam: Gen- not in any distress, complaining of  severe abdominal pain Resp- Normal chest symm             Breath sounds present and equal billaterally Abd-  Tenderness in RUQ and lower quad           Soft, moves with rspiration            Bowel sounds present           Rectal exam- no masses, no fissures,no ulcers Normal sphinter tone, rectal vault is empty, gloved finger stained with brown stool, Heme occult neg. Cardiac- S1 S2, normal, no rubs, murmurs or gallops. Neuro- Cranial nerves 2- 10 normal.             Normal power in extremities             Gait normal. extr-  Peripheral pulses present on the R lower extrem, Reduced on the left.    Filed Vitals:   03/30/13 1348  BP: 166/73  Pulse: 83  Temp: 97.9 F (36.6 C)  TempSrc: Oral  Height: 5\' 6"  (1.676 m)    Weight: 52.617 kg (116 lb)  SpO2: 99%    Assessment & Plan:   # Severe Abdominal pain and weightloss- Possible etiologies abdominal malignancy, as patients last colonoscopy-01/10/2009 -2 polyps, adenomatous by pathology,decreasing the likelihood of intra-colonic malignancy. Acute mesenteric ischemia was a concern, as patient's pain was in the left upper quadrant and was aggravated by meals. Physical exam findings were not proportional to severity of pain reported by the patient. Pancreatic malignancy, absence of jaundice and pain radiating to the back makes this unlikely.  -Lung Ca- Presence of Weightloss, smoking hx and previous hx of pulm nodule found on chest ct, 01/04/2008, which was not followed up by the recommended Ct in 3 mths.  -Ct chest W contrast ordered. -Abd ct was ordered, with and W/o contrast preferred, but was informed that could not be done unless the pancreatic pathology, liver or kidney pathology was considered. So patient was booked for Ct w contrast.  #Hypertension- Was previously on triamterene- hydrochlorothiazide. Patient blood pressure today- 167/73. 5mg  of Lisinopril added to current regimen, . To re-evaluate when she comes for follow up in about 2weeks.

## 2013-04-04 ENCOUNTER — Other Ambulatory Visit (HOSPITAL_COMMUNITY): Payer: Medicare Other

## 2013-04-04 ENCOUNTER — Emergency Department (HOSPITAL_COMMUNITY)
Admission: EM | Admit: 2013-04-04 | Discharge: 2013-04-04 | Disposition: A | Discharge status: Home / Self Care | Payer: Medicare Other | Attending: Emergency Medicine | Admitting: Emergency Medicine

## 2013-04-04 ENCOUNTER — Emergency Department (HOSPITAL_COMMUNITY): Payer: Medicare Other

## 2013-04-04 ENCOUNTER — Encounter (HOSPITAL_COMMUNITY): Payer: Self-pay | Admitting: Emergency Medicine

## 2013-04-04 DIAGNOSIS — R197 Diarrhea, unspecified: Secondary | ICD-10-CM | POA: Insufficient documentation

## 2013-04-04 DIAGNOSIS — R112 Nausea with vomiting, unspecified: Secondary | ICD-10-CM | POA: Insufficient documentation

## 2013-04-04 DIAGNOSIS — Z853 Personal history of malignant neoplasm of breast: Secondary | ICD-10-CM | POA: Insufficient documentation

## 2013-04-04 DIAGNOSIS — E119 Type 2 diabetes mellitus without complications: Secondary | ICD-10-CM | POA: Insufficient documentation

## 2013-04-04 DIAGNOSIS — Z79899 Other long term (current) drug therapy: Secondary | ICD-10-CM | POA: Insufficient documentation

## 2013-04-04 DIAGNOSIS — Z7982 Long term (current) use of aspirin: Secondary | ICD-10-CM | POA: Insufficient documentation

## 2013-04-04 DIAGNOSIS — I10 Essential (primary) hypertension: Secondary | ICD-10-CM | POA: Insufficient documentation

## 2013-04-04 DIAGNOSIS — Z8679 Personal history of other diseases of the circulatory system: Secondary | ICD-10-CM | POA: Insufficient documentation

## 2013-04-04 DIAGNOSIS — IMO0002 Reserved for concepts with insufficient information to code with codable children: Secondary | ICD-10-CM | POA: Insufficient documentation

## 2013-04-04 DIAGNOSIS — N390 Urinary tract infection, site not specified: Secondary | ICD-10-CM | POA: Insufficient documentation

## 2013-04-04 DIAGNOSIS — E042 Nontoxic multinodular goiter: Secondary | ICD-10-CM

## 2013-04-04 DIAGNOSIS — Z8619 Personal history of other infectious and parasitic diseases: Secondary | ICD-10-CM | POA: Insufficient documentation

## 2013-04-04 DIAGNOSIS — E785 Hyperlipidemia, unspecified: Secondary | ICD-10-CM | POA: Insufficient documentation

## 2013-04-04 DIAGNOSIS — F17200 Nicotine dependence, unspecified, uncomplicated: Secondary | ICD-10-CM | POA: Insufficient documentation

## 2013-04-04 DIAGNOSIS — E049 Nontoxic goiter, unspecified: Secondary | ICD-10-CM | POA: Insufficient documentation

## 2013-04-04 LAB — COMPREHENSIVE METABOLIC PANEL
ALT: 11 U/L (ref 0–35)
AST: 15 U/L (ref 0–37)
Albumin: 3.4 g/dL — ABNORMAL LOW (ref 3.5–5.2)
Alkaline Phosphatase: 72 U/L (ref 39–117)
BUN: 32 mg/dL — ABNORMAL HIGH (ref 6–23)
CO2: 27 mEq/L (ref 19–32)
Calcium: 9.7 mg/dL (ref 8.4–10.5)
Chloride: 95 mEq/L — ABNORMAL LOW (ref 96–112)
Creatinine, Ser: 0.97 mg/dL (ref 0.50–1.10)
GFR calc Af Amer: 62 mL/min — ABNORMAL LOW (ref >90)
GFR calc non Af Amer: 53 mL/min — ABNORMAL LOW (ref >90)
Glucose, Bld: 148 mg/dL — ABNORMAL HIGH (ref 70–99)
Potassium: 3.5 mEq/L (ref 3.5–5.1)
Sodium: 136 mEq/L (ref 135–145)
Total Bilirubin: 0.5 mg/dL (ref 0.3–1.2)
Total Protein: 7 g/dL (ref 6.0–8.3)

## 2013-04-04 LAB — URINALYSIS, ROUTINE W REFLEX MICROSCOPIC
Glucose, UA: NEGATIVE mg/dL
Hgb urine dipstick: NEGATIVE
Ketones, ur: 15 mg/dL — AB
Nitrite: NEGATIVE
Protein, ur: 100 mg/dL — AB
Specific Gravity, Urine: 1.016 (ref 1.005–1.030)
Urobilinogen, UA: 1 mg/dL (ref 0.0–1.0)
pH: 5 (ref 5.0–8.0)

## 2013-04-04 LAB — CBC WITH DIFFERENTIAL/PLATELET
Basophils Absolute: 0 10*3/uL (ref 0.0–0.1)
Basophils Relative: 0 % (ref 0–1)
Eosinophils Absolute: 0.1 10*3/uL (ref 0.0–0.7)
Eosinophils Relative: 2 % (ref 0–5)
HCT: 33.3 % — ABNORMAL LOW (ref 36.0–46.0)
Hemoglobin: 11.6 g/dL — ABNORMAL LOW (ref 12.0–15.0)
Lymphocytes Relative: 30 % (ref 12–46)
Lymphs Abs: 2 10*3/uL (ref 0.7–4.0)
MCH: 30.2 pg (ref 26.0–34.0)
MCHC: 34.8 g/dL (ref 30.0–36.0)
MCV: 86.7 fL (ref 78.0–100.0)
Monocytes Absolute: 0.7 10*3/uL (ref 0.1–1.0)
Monocytes Relative: 10 % (ref 3–12)
Neutro Abs: 3.9 10*3/uL (ref 1.7–7.7)
Neutrophils Relative %: 58 % (ref 43–77)
Platelets: 209 10*3/uL (ref 150–400)
RBC: 3.84 MIL/uL — ABNORMAL LOW (ref 3.87–5.11)
RDW: 12.2 % (ref 11.5–15.5)
WBC: 6.7 10*3/uL (ref 4.0–10.5)

## 2013-04-04 LAB — URINE MICROSCOPIC-ADD ON

## 2013-04-04 LAB — LIPASE, BLOOD: Lipase: 15 U/L (ref 11–59)

## 2013-04-04 MED ORDER — HYDROCODONE-ACETAMINOPHEN 5-325 MG PO TABS: 2.0000 | ORAL_TABLET | ORAL | Status: DC | PRN

## 2013-04-04 MED ORDER — SODIUM CHLORIDE 0.9 % IV BOLUS (SEPSIS)
1000.0000 mL | Freq: Once | INTRAVENOUS | Status: AC
  Administered 2013-04-04: 1000 mL via INTRAVENOUS

## 2013-04-04 MED ORDER — IOHEXOL 300 MG/ML  SOLN
100.0000 mL | Freq: Once | INTRAMUSCULAR | Status: AC | PRN
  Administered 2013-04-04: 100 mL via INTRAVENOUS

## 2013-04-04 MED ORDER — IOHEXOL 300 MG/ML  SOLN
25.0000 mL | INTRAMUSCULAR | Status: AC
  Administered 2013-04-04: 25 mL via ORAL

## 2013-04-04 MED ORDER — CEPHALEXIN 500 MG PO CAPS: 500.0000 mg | ORAL_CAPSULE | Freq: Four times a day (QID) | ORAL | Status: DC

## 2013-04-04 MED ORDER — ONDANSETRON 8 MG PO TBDP: ORAL_TABLET | ORAL | Status: DC

## 2013-04-04 NOTE — ED Notes (Signed)
Pt from home c/o lower abd pain x 1 month, with emesis yesterday, watery stools in am.
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
## 2013-04-04 NOTE — ED Provider Notes (Signed)
Gina [name] S 8:00 PM patient discussed in sign out. Elderly patient complaining of several weeks of lower abdominal pain. Also having 30 pound weight loss recently without effort. Yesterday began having nausea vomiting and some watery stools.labs showing some signs of UTI otherwise without significant findings. CT pending to evaluate for any possible malignancy, diverticulitis or other cause of symptoms.  CT without any concerning findings in the abdomen to explain symptoms. No masses. No diverticulitis. Patient does have a small nodule to the thyroid on chest CT. At this time we'll plan to discharge with symptomatic treatment and advice for PCP followup for continued evaluation of symptoms.  Martie Lee, PA-C 04/04/13 2101

## 2013-04-05 ENCOUNTER — Ambulatory Visit (HOSPITAL_COMMUNITY): Payer: Medicare Other

## 2013-04-05 NOTE — ED Provider Notes (Signed)
I was available for consultation during the completion of the patient's care.    Carmin Muskrat, MD 04/05/13 0003

## 2013-04-06 DIAGNOSIS — R109 Unspecified abdominal pain: Secondary | ICD-10-CM | POA: Insufficient documentation

## 2013-04-06 LAB — URINE CULTURE: Colony Count: 8000

## 2013-04-06 NOTE — ED Provider Notes (Signed)
Medical screening examination/treatment/procedure(s) were conducted as a shared visit with non-physician practitioner(s) and myself.  I personally evaluated the patient during the encounter Rhunette Croft, M.D.  Gina [name] is a 79 y.o. female presenting with some left lower quadrant pain as well as suprapubic and diffuse upper abdominal pain, pain is intermittent, crampy, can be moderate, associated with nausea, vomiting and anorexia. Patient has not been eating well, she's lost 30 pounds in the last month. Patient is followed by family medicine clinic here at [facility], she does have 2 CAT scans scheduled tomorrow. She is a current everyday smoker. She denies chest pain, shortness of breath, headache, diarrhea, hematochezia or melena. She denies dysuria, frequency, fevers or chills.  At least 10pt or greater review of systems completed and are negative except where specified in the HPI.   VITAL SIGNS:   Filed Vitals:   04/04/13 2108  BP:   Pulse:   Temp: 98.7 F (37.1 C)  Resp:    CONSTITUTIONAL: Awake, oriented, appears non-toxic HENT: Atraumatic, normocephalic, oral mucosa pink and moist, airway patent. Nares patent without drainage. External ears normal. EYES: Conjunctiva clear, EOMI, PERRLA NECK: Trachea midline, non-tender, supple CARDIOVASCULAR: Normal heart rate, Normal rhythm, No murmurs, rubs, gallops PULMONARY/CHEST: Clear to auscultation, no rhonchi, wheezes, or rales. Symmetrical breath sounds. Non-tender. ABDOMINAL: Non-distended, soft, non-tender - no rebound or guarding.  BS normal. NEUROLOGIC: Non-focal, moving all four extremities, no gross sensory or motor deficits. EXTREMITIES: No clubbing, cyanosis, or edema SKIN: Warm, Dry, No erythema, No rash  Ct Chest W Contrast  04/04/2013   *RADIOLOGY REPORT*  Clinical Data:  Weight loss.  Abdominal pain, nausea, vomiting. Question malignancy.  CT CHEST, ABDOMEN AND PELVIS WITH CONTRAST  Technique:  Multidetector CT  imaging of the chest, abdomen and pelvis was performed following the standard protocol during bolus administration of intravenous contrast.  Contrast: 181mL OMNIPAQUE IOHEXOL 300 MG/ML  SOLN  Comparison:   None.  CT CHEST  Findings:  Calcified granuloma in the left lower lobe.  There is scarring in the apices.  Lungs otherwise clear.  No pleural effusions.  Heart is normal size. Aorta is normal caliber.  Coronary artery calcifications. No mediastinal, hilar, or axillary adenopathy.  Enlarged right thyroid lobe with nodules in the right thyroid lobe and isthmus, presumably multinodular goiter.  This can be further characterized with nonemergent elective thyroid ultrasound.  Chest wall soft tissues unremarkable.  No acute bony abnormality.  IMPRESSION: Coronary artery disease.  Nodules in the right thyroid lobe and isthmus, likely multinodular goiter.  This can be further characterized with elective nonemergent outpatient ultrasound.  CT ABDOMEN AND PELVIS  Findings:  Liver, gallbladder, spleen, pancreas, adrenals are unremarkable.  Bilateral nephrolithiasis.  No hydronephrosis.  No evidence of solid organ injury.  Bowel grossly unremarkable.  No free fluid, free air, or adenopathy.  Aorta is calcified, non-aneurysmal.  Prior hysterectomy.  No adnexal masses.  No acute bony abnormality.  Degenerative changes in the lumbar spine  IMPRESSION: No acute findings in the abdomen or pelvis.   Original Report Authenticated By: Rolm Baptise, M.D.   Ct Abdomen Pelvis W Contrast  04/04/2013   *RADIOLOGY REPORT*  Clinical Data:  Weight loss.  Abdominal pain, nausea, vomiting. Question malignancy.  CT CHEST, ABDOMEN AND PELVIS WITH CONTRAST  Technique:  Multidetector CT imaging of the chest, abdomen and pelvis was performed following the standard protocol during bolus administration of intravenous contrast.  Contrast: 168mL OMNIPAQUE IOHEXOL 300 MG/ML  SOLN  Comparison:  None.  CT CHEST  Findings:  Calcified granuloma in the  left lower lobe.  There is scarring in the apices.  Lungs otherwise clear.  No pleural effusions.  Heart is normal size. Aorta is normal caliber.  Coronary artery calcifications. No mediastinal, hilar, or axillary adenopathy.  Enlarged right thyroid lobe with nodules in the right thyroid lobe and isthmus, presumably multinodular goiter.  This can be further characterized with nonemergent elective thyroid ultrasound.  Chest wall soft tissues unremarkable.  No acute bony abnormality.  IMPRESSION: Coronary artery disease.  Nodules in the right thyroid lobe and isthmus, likely multinodular goiter.  This can be further characterized with elective nonemergent outpatient ultrasound.  CT ABDOMEN AND PELVIS  Findings:  Liver, gallbladder, spleen, pancreas, adrenals are unremarkable.  Bilateral nephrolithiasis.  No hydronephrosis.  No evidence of solid organ injury.  Bowel grossly unremarkable.  No free fluid, free air, or adenopathy.  Aorta is calcified, non-aneurysmal.  Prior hysterectomy.  No adnexal masses.  No acute bony abnormality.  Degenerative changes in the lumbar spine  IMPRESSION: No acute findings in the abdomen or pelvis.   Original Report Authenticated By: Rolm Baptise, M.D.    MDM: Patient with benign abdominal exam however patient has had significant weight loss. No symptoms of UTI, urinalysis is on convincing for urinary tract infection. CT chest abdomen pelvis shows some pulmonary nodules however no concerning masses, CT of the abdomen and pelvis is unremarkable. Patient will followup with new primary care at [facility] clinic.  Given pain medication nausea medication. Presentation not consistent with mesenteric ischemia - no abdominal emergency identified at this time. Patient will followup as an outpatient.   Rhunette Croft, MD 04/06/13 [id number]

## 2013-04-08 ENCOUNTER — Other Ambulatory Visit: Payer: Self-pay

## 2013-04-09 ENCOUNTER — Encounter (HOSPITAL_COMMUNITY): Payer: Self-pay | Admitting: *Deleted

## 2013-04-09 ENCOUNTER — Emergency Department (HOSPITAL_COMMUNITY): Payer: Medicare Other

## 2013-04-09 ENCOUNTER — Emergency Department (HOSPITAL_COMMUNITY)
Admission: EM | Admit: 2013-04-09 | Discharge: 2013-04-09 | Disposition: A | Discharge status: Home / Self Care | Payer: Medicare Other | Attending: Emergency Medicine | Admitting: Emergency Medicine

## 2013-04-09 DIAGNOSIS — Z853 Personal history of malignant neoplasm of breast: Secondary | ICD-10-CM | POA: Diagnosis not present

## 2013-04-09 DIAGNOSIS — R109 Unspecified abdominal pain: Secondary | ICD-10-CM | POA: Diagnosis present

## 2013-04-09 DIAGNOSIS — R110 Nausea: Secondary | ICD-10-CM | POA: Insufficient documentation

## 2013-04-09 DIAGNOSIS — Z862 Personal history of diseases of the blood and blood-forming organs and certain disorders involving the immune mechanism: Secondary | ICD-10-CM | POA: Insufficient documentation

## 2013-04-09 DIAGNOSIS — Z8639 Personal history of other endocrine, nutritional and metabolic disease: Secondary | ICD-10-CM | POA: Insufficient documentation

## 2013-04-09 DIAGNOSIS — R630 Anorexia: Secondary | ICD-10-CM | POA: Insufficient documentation

## 2013-04-09 DIAGNOSIS — Z8619 Personal history of other infectious and parasitic diseases: Secondary | ICD-10-CM | POA: Diagnosis not present

## 2013-04-09 DIAGNOSIS — Z8679 Personal history of other diseases of the circulatory system: Secondary | ICD-10-CM | POA: Insufficient documentation

## 2013-04-09 DIAGNOSIS — R5381 Other malaise: Secondary | ICD-10-CM | POA: Diagnosis not present

## 2013-04-09 DIAGNOSIS — K5900 Constipation, unspecified: Secondary | ICD-10-CM | POA: Insufficient documentation

## 2013-04-09 DIAGNOSIS — I10 Essential (primary) hypertension: Secondary | ICD-10-CM | POA: Diagnosis not present

## 2013-04-09 DIAGNOSIS — Z79899 Other long term (current) drug therapy: Secondary | ICD-10-CM | POA: Diagnosis not present

## 2013-04-09 DIAGNOSIS — R5383 Other fatigue: Secondary | ICD-10-CM | POA: Diagnosis not present

## 2013-04-09 DIAGNOSIS — F17200 Nicotine dependence, unspecified, uncomplicated: Secondary | ICD-10-CM | POA: Insufficient documentation

## 2013-04-09 DIAGNOSIS — E119 Type 2 diabetes mellitus without complications: Secondary | ICD-10-CM | POA: Insufficient documentation

## 2013-04-09 LAB — TROPONIN I: Troponin I: <0.3 ng/mL (ref <0.30)

## 2013-04-09 LAB — CBC WITH DIFFERENTIAL/PLATELET
Basophils Absolute: 0.1 10*3/uL (ref 0.0–0.1)
Basophils Relative: 2 % — ABNORMAL HIGH (ref 0–1)
Eosinophils Absolute: 0.1 10*3/uL (ref 0.0–0.7)
Eosinophils Relative: 1 % (ref 0–5)
HCT: 30.9 % — ABNORMAL LOW (ref 36.0–46.0)
Hemoglobin: 10.7 g/dL — ABNORMAL LOW (ref 12.0–15.0)
Lymphocytes Relative: 23 % (ref 12–46)
Lymphs Abs: 1.8 10*3/uL (ref 0.7–4.0)
MCH: 29.9 pg (ref 26.0–34.0)
MCHC: 34.6 g/dL (ref 30.0–36.0)
MCV: 86.3 fL (ref 78.0–100.0)
Monocytes Absolute: 0.6 10*3/uL (ref 0.1–1.0)
Monocytes Relative: 7 % (ref 3–12)
Neutro Abs: 5.4 10*3/uL (ref 1.7–7.7)
Neutrophils Relative %: 67 % (ref 43–77)
Platelets: 226 10*3/uL (ref 150–400)
RBC: 3.58 MIL/uL — ABNORMAL LOW (ref 3.87–5.11)
RDW: 12.1 % (ref 11.5–15.5)
WBC: 8 10*3/uL (ref 4.0–10.5)

## 2013-04-09 LAB — URINALYSIS, ROUTINE W REFLEX MICROSCOPIC
Glucose, UA: NEGATIVE mg/dL
Hgb urine dipstick: NEGATIVE
Ketones, ur: 15 mg/dL — AB
Leukocytes, UA: NEGATIVE
Nitrite: NEGATIVE
Protein, ur: NEGATIVE mg/dL
Specific Gravity, Urine: 1.021 (ref 1.005–1.030)
Urobilinogen, UA: 0.2 mg/dL (ref 0.0–1.0)
pH: 5 (ref 5.0–8.0)

## 2013-04-09 LAB — COMPREHENSIVE METABOLIC PANEL
ALT: 11 U/L (ref 0–35)
AST: 13 U/L (ref 0–37)
Albumin: 3.2 g/dL — ABNORMAL LOW (ref 3.5–5.2)
Alkaline Phosphatase: 64 U/L (ref 39–117)
BUN: 30 mg/dL — ABNORMAL HIGH (ref 6–23)
CO2: 23 mEq/L (ref 19–32)
Calcium: 9.6 mg/dL (ref 8.4–10.5)
Chloride: 95 mEq/L — ABNORMAL LOW (ref 96–112)
Creatinine, Ser: 1.16 mg/dL — ABNORMAL HIGH (ref 0.50–1.10)
GFR calc Af Amer: 50 mL/min — ABNORMAL LOW (ref >90)
GFR calc non Af Amer: 43 mL/min — ABNORMAL LOW (ref >90)
Glucose, Bld: 82 mg/dL (ref 70–99)
Potassium: 3.6 mEq/L (ref 3.5–5.1)
Sodium: 137 mEq/L (ref 135–145)
Total Bilirubin: 0.5 mg/dL (ref 0.3–1.2)
Total Protein: 6.8 g/dL (ref 6.0–8.3)

## 2013-04-09 LAB — POCT I-STAT, CHEM 8
BUN: 30 mg/dL — ABNORMAL HIGH (ref 6–23)
Calcium, Ion: 1.16 mmol/L (ref 1.13–1.30)
Chloride: 99 mEq/L (ref 96–112)
Creatinine, Ser: 1.3 mg/dL — ABNORMAL HIGH (ref 0.50–1.10)
Glucose, Bld: 81 mg/dL (ref 70–99)
HCT: 32 % — ABNORMAL LOW (ref 36.0–46.0)
Hemoglobin: 10.9 g/dL — ABNORMAL LOW (ref 12.0–15.0)
Potassium: 3.8 mEq/L (ref 3.5–5.1)
Sodium: 136 mEq/L (ref 135–145)
TCO2: 23 mmol/L (ref 0–100)

## 2013-04-09 LAB — OCCULT BLOOD, POC DEVICE: Fecal Occult Bld: POSITIVE — AB

## 2013-04-09 LAB — LIPASE, BLOOD: Lipase: 10 U/L — ABNORMAL LOW (ref 11–59)

## 2013-04-09 LAB — LACTIC ACID, PLASMA: Lactic Acid, Venous: 1.8 mmol/L (ref 0.5–2.2)

## 2013-04-09 MED ORDER — IOHEXOL 350 MG/ML SOLN
100.0000 mL | Freq: Once | INTRAVENOUS | Status: AC | PRN
  Administered 2013-04-09: 100 mL via INTRAVENOUS

## 2013-04-09 MED ORDER — ONDANSETRON HCL 4 MG PO TABS: 4.0000 mg | ORAL_TABLET | Freq: Four times a day (QID) | ORAL | Status: DC

## 2013-04-09 NOTE — ED Notes (Signed)
Phlebotomy attempt x 1 to obtain blood with no success. Another phlebotomist to come and draw blood. This RN unable to obtain blood in IV.

## 2013-04-09 NOTE — ED Notes (Signed)
Pt in c/o abd pain since Sunday, states she was seen at that time for the same and was told she had a kidney infection, states she was sent back in today by her PCP for a CT scan of her abdomen and was told to follow up back up with her MD on Friday. Pt states she has had decreased appetite and has lost weight this week.
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
## 2013-04-09 NOTE — ED Notes (Signed)
1920  Received report from off going RN and introduced self to the pt.

## 2013-04-13 NOTE — Progress Notes (Signed)
I saw and evaluated the patient.  I personally confirmed the key portions of the history and exam documented by Dr. Emokpae and I reviewed pertinent patient test results.  The assessment, diagnosis, and plan were formulated together and I agree with the documentation in the resident's note. 

## 2013-04-28 ENCOUNTER — Emergency Department (HOSPITAL_COMMUNITY)
Admission: EM | Admit: 2013-04-28 | Discharge: 2013-04-28 | Disposition: A | Discharge status: Home / Self Care | Payer: Medicare Other | Attending: Emergency Medicine | Admitting: Emergency Medicine

## 2013-04-28 ENCOUNTER — Encounter (HOSPITAL_COMMUNITY): Payer: Self-pay | Admitting: Emergency Medicine

## 2013-04-28 DIAGNOSIS — Z8679 Personal history of other diseases of the circulatory system: Secondary | ICD-10-CM | POA: Insufficient documentation

## 2013-04-28 DIAGNOSIS — Z9010 Acquired absence of unspecified breast and nipple: Secondary | ICD-10-CM | POA: Insufficient documentation

## 2013-04-28 DIAGNOSIS — E785 Hyperlipidemia, unspecified: Secondary | ICD-10-CM | POA: Insufficient documentation

## 2013-04-28 DIAGNOSIS — I10 Essential (primary) hypertension: Secondary | ICD-10-CM | POA: Insufficient documentation

## 2013-04-28 DIAGNOSIS — R112 Nausea with vomiting, unspecified: Secondary | ICD-10-CM | POA: Insufficient documentation

## 2013-04-28 DIAGNOSIS — R109 Unspecified abdominal pain: Secondary | ICD-10-CM | POA: Insufficient documentation

## 2013-04-28 DIAGNOSIS — Z8611 Personal history of tuberculosis: Secondary | ICD-10-CM | POA: Insufficient documentation

## 2013-04-28 DIAGNOSIS — E119 Type 2 diabetes mellitus without complications: Secondary | ICD-10-CM | POA: Insufficient documentation

## 2013-04-28 DIAGNOSIS — F17200 Nicotine dependence, unspecified, uncomplicated: Secondary | ICD-10-CM | POA: Insufficient documentation

## 2013-04-28 DIAGNOSIS — Z9889 Other specified postprocedural states: Secondary | ICD-10-CM | POA: Insufficient documentation

## 2013-04-28 DIAGNOSIS — R634 Abnormal weight loss: Secondary | ICD-10-CM | POA: Insufficient documentation

## 2013-04-28 DIAGNOSIS — Z79899 Other long term (current) drug therapy: Secondary | ICD-10-CM | POA: Insufficient documentation

## 2013-04-28 DIAGNOSIS — Z951 Presence of aortocoronary bypass graft: Secondary | ICD-10-CM | POA: Insufficient documentation

## 2013-04-28 DIAGNOSIS — R638 Other symptoms and signs concerning food and fluid intake: Secondary | ICD-10-CM | POA: Insufficient documentation

## 2013-04-28 DIAGNOSIS — Z853 Personal history of malignant neoplasm of breast: Secondary | ICD-10-CM | POA: Insufficient documentation

## 2013-04-28 DIAGNOSIS — Z90710 Acquired absence of both cervix and uterus: Secondary | ICD-10-CM | POA: Insufficient documentation

## 2013-04-28 LAB — URINALYSIS, ROUTINE W REFLEX MICROSCOPIC
Bilirubin Urine: NEGATIVE
Glucose, UA: NEGATIVE mg/dL
Hgb urine dipstick: NEGATIVE
Ketones, ur: NEGATIVE mg/dL
Nitrite: NEGATIVE
Protein, ur: NEGATIVE mg/dL
Specific Gravity, Urine: 1.02 (ref 1.005–1.030)
Urobilinogen, UA: 1 mg/dL (ref 0.0–1.0)
pH: 5 (ref 5.0–8.0)

## 2013-04-28 LAB — CBC WITH DIFFERENTIAL/PLATELET
Basophils Absolute: 0 10*3/uL (ref 0.0–0.1)
Basophils Relative: 0 % (ref 0–1)
Eosinophils Absolute: 0.1 10*3/uL (ref 0.0–0.7)
Eosinophils Relative: 2 % (ref 0–5)
HCT: 30.9 % — ABNORMAL LOW (ref 36.0–46.0)
Hemoglobin: 10.6 g/dL — ABNORMAL LOW (ref 12.0–15.0)
Lymphocytes Relative: 28 % (ref 12–46)
Lymphs Abs: 1.7 10*3/uL (ref 0.7–4.0)
MCH: 30 pg (ref 26.0–34.0)
MCHC: 34.3 g/dL (ref 30.0–36.0)
MCV: 87.5 fL (ref 78.0–100.0)
Monocytes Absolute: 0.4 10*3/uL (ref 0.1–1.0)
Monocytes Relative: 7 % (ref 3–12)
Neutro Abs: 3.8 10*3/uL (ref 1.7–7.7)
Neutrophils Relative %: 63 % (ref 43–77)
Platelets: 186 10*3/uL (ref 150–400)
RBC: 3.53 MIL/uL — ABNORMAL LOW (ref 3.87–5.11)
RDW: 12.7 % (ref 11.5–15.5)
WBC: 6 10*3/uL (ref 4.0–10.5)

## 2013-04-28 LAB — COMPREHENSIVE METABOLIC PANEL
ALT: 10 U/L (ref 0–35)
AST: 12 U/L (ref 0–37)
Albumin: 3.4 g/dL — ABNORMAL LOW (ref 3.5–5.2)
Alkaline Phosphatase: 58 U/L (ref 39–117)
BUN: 31 mg/dL — ABNORMAL HIGH (ref 6–23)
CO2: 28 mEq/L (ref 19–32)
Calcium: 9.7 mg/dL (ref 8.4–10.5)
Chloride: 101 mEq/L (ref 96–112)
Creatinine, Ser: 0.98 mg/dL (ref 0.50–1.10)
GFR calc Af Amer: 61 mL/min — ABNORMAL LOW (ref >90)
GFR calc non Af Amer: 53 mL/min — ABNORMAL LOW (ref >90)
Glucose, Bld: 122 mg/dL — ABNORMAL HIGH (ref 70–99)
Potassium: 3.7 mEq/L (ref 3.5–5.1)
Sodium: 140 mEq/L (ref 135–145)
Total Bilirubin: 0.4 mg/dL (ref 0.3–1.2)
Total Protein: 6.5 g/dL (ref 6.0–8.3)

## 2013-04-28 LAB — URINE MICROSCOPIC-ADD ON

## 2013-04-28 LAB — LIPASE, BLOOD: Lipase: 20 U/L (ref 11–59)

## 2013-04-28 MED ORDER — HYDROCODONE-ACETAMINOPHEN 5-325 MG PO TABS: 1.0000 | ORAL_TABLET | ORAL | Status: DC | PRN

## 2013-04-28 MED ORDER — HYDROCODONE-ACETAMINOPHEN 5-325 MG PO TABS
1.0000 | ORAL_TABLET | Freq: Once | ORAL | Status: AC
  Administered 2013-04-28: 1 via ORAL
  Filled 2013-04-28: qty 1

## 2013-04-28 NOTE — ED Notes (Signed)
Per EMS pt transported from home c/o upper R & L quad abdominal pain onset 1 month ago, pt seen at PCP today, endo scheduled. BM today. Ambulatory to ambulance.

## 2013-04-28 NOTE — ED Notes (Signed)
Pt states she has been to mulitple MD's c/o abd pain including ED, pt due to have EGD 8/12. Pt states lives alone and has no refills on pain meds. Pt states PCP did not give anything for pain.
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
## 2013-04-28 NOTE — ED Notes (Signed)
[id number] Expected date:<BR> Expected time:<BR> Means of arrival:<BR> Comments:<BR> 79 yo-abdominal pain

## 2013-04-28 NOTE — ED Notes (Signed)
Phlebotomy at bedside.

## 2013-05-04 ENCOUNTER — Encounter: Payer: Medicare Other | Admitting: Internal Medicine

## 2013-05-06 ENCOUNTER — Ambulatory Visit (HOSPITAL_COMMUNITY): Admit: 2013-05-06 | Payer: Self-pay | Admitting: Gastroenterology

## 2013-05-06 ENCOUNTER — Encounter (HOSPITAL_COMMUNITY): Payer: Self-pay | Admitting: *Deleted

## 2013-05-06 ENCOUNTER — Emergency Department (HOSPITAL_COMMUNITY): Payer: Medicare Other

## 2013-05-06 ENCOUNTER — Encounter (HOSPITAL_COMMUNITY): Payer: Self-pay

## 2013-05-06 ENCOUNTER — Inpatient Hospital Stay (HOSPITAL_COMMUNITY): Payer: Medicare Other

## 2013-05-06 ENCOUNTER — Inpatient Hospital Stay (HOSPITAL_COMMUNITY)
Admission: EM | Admit: 2013-05-06 | Discharge: 2013-05-11 | DRG: 640 | Disposition: A | Discharge status: Skilled Nursing Facility | Payer: Medicare Other | Attending: Internal Medicine | Admitting: Internal Medicine

## 2013-05-06 DIAGNOSIS — E785 Hyperlipidemia, unspecified: Secondary | ICD-10-CM | POA: Diagnosis present

## 2013-05-06 DIAGNOSIS — R404 Transient alteration of awareness: Secondary | ICD-10-CM | POA: Diagnosis present

## 2013-05-06 DIAGNOSIS — R42 Dizziness and giddiness: Secondary | ICD-10-CM | POA: Diagnosis present

## 2013-05-06 DIAGNOSIS — R1310 Dysphagia, unspecified: Secondary | ICD-10-CM | POA: Diagnosis present

## 2013-05-06 DIAGNOSIS — R1314 Dysphagia, pharyngoesophageal phase: Secondary | ICD-10-CM

## 2013-05-06 DIAGNOSIS — K5520 Angiodysplasia of colon without hemorrhage: Secondary | ICD-10-CM

## 2013-05-06 DIAGNOSIS — W19XXXA Unspecified fall, initial encounter: Secondary | ICD-10-CM

## 2013-05-06 DIAGNOSIS — F17200 Nicotine dependence, unspecified, uncomplicated: Secondary | ICD-10-CM | POA: Diagnosis present

## 2013-05-06 DIAGNOSIS — R634 Abnormal weight loss: Secondary | ICD-10-CM | POA: Diagnosis present

## 2013-05-06 DIAGNOSIS — K259 Gastric ulcer, unspecified as acute or chronic, without hemorrhage or perforation: Secondary | ICD-10-CM | POA: Diagnosis present

## 2013-05-06 DIAGNOSIS — Z720 Tobacco use: Secondary | ICD-10-CM | POA: Diagnosis present

## 2013-05-06 DIAGNOSIS — N179 Acute kidney failure, unspecified: Secondary | ICD-10-CM | POA: Diagnosis present

## 2013-05-06 DIAGNOSIS — R7611 Nonspecific reaction to tuberculin skin test without active tuberculosis: Secondary | ICD-10-CM | POA: Diagnosis present

## 2013-05-06 DIAGNOSIS — J984 Other disorders of lung: Secondary | ICD-10-CM | POA: Diagnosis present

## 2013-05-06 DIAGNOSIS — H409 Unspecified glaucoma: Secondary | ICD-10-CM | POA: Diagnosis present

## 2013-05-06 DIAGNOSIS — I10 Essential (primary) hypertension: Secondary | ICD-10-CM | POA: Diagnosis present

## 2013-05-06 DIAGNOSIS — I951 Orthostatic hypotension: Secondary | ICD-10-CM | POA: Diagnosis present

## 2013-05-06 DIAGNOSIS — E041 Nontoxic single thyroid nodule: Secondary | ICD-10-CM | POA: Diagnosis present

## 2013-05-06 DIAGNOSIS — R55 Syncope and collapse: Secondary | ICD-10-CM | POA: Diagnosis present

## 2013-05-06 DIAGNOSIS — Q2733 Arteriovenous malformation of digestive system vessel: Secondary | ICD-10-CM | POA: Diagnosis present

## 2013-05-06 DIAGNOSIS — K5900 Constipation, unspecified: Secondary | ICD-10-CM

## 2013-05-06 DIAGNOSIS — I739 Peripheral vascular disease, unspecified: Secondary | ICD-10-CM | POA: Diagnosis present

## 2013-05-06 DIAGNOSIS — E860 Dehydration: Principal | ICD-10-CM | POA: Diagnosis present

## 2013-05-06 DIAGNOSIS — E43 Unspecified severe protein-calorie malnutrition: Secondary | ICD-10-CM | POA: Diagnosis present

## 2013-05-06 DIAGNOSIS — E119 Type 2 diabetes mellitus without complications: Secondary | ICD-10-CM | POA: Diagnosis present

## 2013-05-06 DIAGNOSIS — Z853 Personal history of malignant neoplasm of breast: Secondary | ICD-10-CM

## 2013-05-06 DIAGNOSIS — M549 Dorsalgia, unspecified: Secondary | ICD-10-CM

## 2013-05-06 DIAGNOSIS — S0100XA Unspecified open wound of scalp, initial encounter: Secondary | ICD-10-CM | POA: Diagnosis present

## 2013-05-06 DIAGNOSIS — E042 Nontoxic multinodular goiter: Secondary | ICD-10-CM | POA: Diagnosis present

## 2013-05-06 DIAGNOSIS — R1012 Left upper quadrant pain: Secondary | ICD-10-CM

## 2013-05-06 DIAGNOSIS — S0101XA Laceration without foreign body of scalp, initial encounter: Secondary | ICD-10-CM

## 2013-05-06 LAB — URINE MICROSCOPIC-ADD ON

## 2013-05-06 LAB — CBC WITH DIFFERENTIAL/PLATELET
Basophils Absolute: 0 10*3/uL (ref 0.0–0.1)
Basophils Relative: 0 % (ref 0–1)
Eosinophils Absolute: 0 10*3/uL (ref 0.0–0.7)
Eosinophils Relative: 1 % (ref 0–5)
HCT: 33 % — ABNORMAL LOW (ref 36.0–46.0)
Hemoglobin: 11.7 g/dL — ABNORMAL LOW (ref 12.0–15.0)
Lymphocytes Relative: 14 % (ref 12–46)
Lymphs Abs: 0.9 10*3/uL (ref 0.7–4.0)
MCH: 31.1 pg (ref 26.0–34.0)
MCHC: 35.5 g/dL (ref 30.0–36.0)
MCV: 87.8 fL (ref 78.0–100.0)
Monocytes Absolute: 0.5 10*3/uL (ref 0.1–1.0)
Monocytes Relative: 8 % (ref 3–12)
Neutro Abs: 5.2 10*3/uL (ref 1.7–7.7)
Neutrophils Relative %: 78 % — ABNORMAL HIGH (ref 43–77)
Platelets: 188 10*3/uL (ref 150–400)
RBC: 3.76 MIL/uL — ABNORMAL LOW (ref 3.87–5.11)
RDW: 13.1 % (ref 11.5–15.5)
WBC: 6.7 10*3/uL (ref 4.0–10.5)

## 2013-05-06 LAB — BASIC METABOLIC PANEL
BUN: 60 mg/dL — ABNORMAL HIGH (ref 6–23)
CO2: 23 mEq/L (ref 19–32)
Calcium: 10.7 mg/dL — ABNORMAL HIGH (ref 8.4–10.5)
Chloride: 98 mEq/L (ref 96–112)
Creatinine, Ser: 1.72 mg/dL — ABNORMAL HIGH (ref 0.50–1.10)
GFR calc Af Amer: 31 mL/min — ABNORMAL LOW (ref >90)
GFR calc non Af Amer: 27 mL/min — ABNORMAL LOW (ref >90)
Glucose, Bld: 110 mg/dL — ABNORMAL HIGH (ref 70–99)
Potassium: 4.8 mEq/L (ref 3.5–5.1)
Sodium: 141 mEq/L (ref 135–145)

## 2013-05-06 LAB — URINALYSIS, ROUTINE W REFLEX MICROSCOPIC
Glucose, UA: NEGATIVE mg/dL
Hgb urine dipstick: NEGATIVE
Ketones, ur: 15 mg/dL — AB
Nitrite: NEGATIVE
Protein, ur: 30 mg/dL — AB
Specific Gravity, Urine: 1.015 (ref 1.005–1.030)
Urobilinogen, UA: 1 mg/dL (ref 0.0–1.0)
pH: 5 (ref 5.0–8.0)

## 2013-05-06 LAB — TROPONIN I
Troponin I: <0.3 ng/mL (ref <0.30)
Troponin I: <0.3 ng/mL (ref <0.30)

## 2013-05-06 SURGERY — EGD (ESOPHAGOGASTRODUODENOSCOPY)
Anesthesia: Moderate Sedation

## 2013-05-06 MED ORDER — DORZOLAMIDE HCL-TIMOLOL MAL 2-0.5 % OP SOLN
1.0000 [drp] | Freq: Every day | OPHTHALMIC | Status: DC
  Administered 2013-05-06 – 2013-05-11 (×6): 1 [drp] via OPHTHALMIC
  Filled 2013-05-06 (×2): qty 10

## 2013-05-06 MED ORDER — TOBRAMYCIN 0.3 % OP SOLN
1.0000 [drp] | Freq: Four times a day (QID) | OPHTHALMIC | Status: DC
  Administered 2013-05-06 – 2013-05-11 (×17): 1 [drp] via OPHTHALMIC
  Filled 2013-05-06: qty 5

## 2013-05-06 MED ORDER — SODIUM CHLORIDE 0.9 % IJ SOLN
3.0000 mL | Freq: Two times a day (BID) | INTRAMUSCULAR | Status: DC
  Administered 2013-05-06 – 2013-05-11 (×7): 3 mL via INTRAVENOUS

## 2013-05-06 MED ORDER — SODIUM CHLORIDE 0.9 % IV SOLN
INTRAVENOUS | Status: DC
  Administered 2013-05-06 – 2013-05-10 (×10): via INTRAVENOUS

## 2013-05-06 MED ORDER — HEPARIN SODIUM (PORCINE) 5000 UNIT/ML IJ SOLN
5000.0000 [IU] | Freq: Three times a day (TID) | INTRAMUSCULAR | Status: DC
  Administered 2013-05-06 – 2013-05-11 (×15): 5000 [IU] via SUBCUTANEOUS
  Filled 2013-05-06 (×17): qty 1

## 2013-05-06 MED ORDER — GENTAMICIN SULFATE 0.3 % OP SOLN
1.0000 [drp] | Freq: Four times a day (QID) | OPHTHALMIC | Status: DC
  Filled 2013-05-06: qty 5

## 2013-05-06 MED ORDER — AMLODIPINE BESYLATE 10 MG PO TABS
10.0000 mg | ORAL_TABLET | Freq: Every day | ORAL | Status: DC
  Administered 2013-05-06 – 2013-05-11 (×6): 10 mg via ORAL
  Filled 2013-05-06 (×6): qty 1

## 2013-05-06 MED ORDER — METOPROLOL TARTRATE 25 MG PO TABS
25.0000 mg | ORAL_TABLET | Freq: Two times a day (BID) | ORAL | Status: DC
  Administered 2013-05-06 – 2013-05-11 (×10): 25 mg via ORAL
  Filled 2013-05-06 (×11): qty 1

## 2013-05-06 MED ORDER — HYDROCODONE-ACETAMINOPHEN 5-325 MG PO TABS
1.0000 | ORAL_TABLET | ORAL | Status: DC | PRN
  Administered 2013-05-06 – 2013-05-07 (×2): 1 via ORAL
  Filled 2013-05-06 (×2): qty 1

## 2013-05-06 MED ORDER — OMEPRAZOLE MAGNESIUM 20 MG PO TBEC
20.0000 mg | DELAYED_RELEASE_TABLET | Freq: Every day | ORAL | Status: DC | PRN
  Filled 2013-05-06: qty 1

## 2013-05-06 MED ORDER — SODIUM CHLORIDE 0.9 % IV BOLUS (SEPSIS)
500.0000 mL | Freq: Once | INTRAVENOUS | Status: AC
  Administered 2013-05-06: 500 mL via INTRAVENOUS

## 2013-05-06 MED ORDER — TETANUS-DIPHTH-ACELL PERTUSSIS 5-2.5-18.5 LF-MCG/0.5 IM SUSP
0.5000 mL | Freq: Once | INTRAMUSCULAR | Status: DC
  Filled 2013-05-06: qty 0.5

## 2013-05-06 MED ORDER — ATORVASTATIN CALCIUM 40 MG PO TABS
40.0000 mg | ORAL_TABLET | Freq: Every day | ORAL | Status: DC
  Administered 2013-05-06 – 2013-05-10 (×5): 40 mg via ORAL
  Filled 2013-05-06 (×6): qty 1

## 2013-05-06 NOTE — Progress Notes (Signed)
Received pt from ED via bed. Assisted to bed. VS taken and recorded. a Tele on. Oriented to room and call bell. Fall prevention initiated. Kept NPO as ordered.

## 2013-05-06 NOTE — ED Notes (Signed)
Patient transported to X-ray & CT °

## 2013-05-06 NOTE — ED Notes (Signed)
Admitting MD at bedside.

## 2013-05-06 NOTE — ED Notes (Addendum)
Reports jumped out of bed to answer door & had syncopal episode. States fell backwards. Nurse aid assisted pt to chair. C/o h/a, thoracic back pain. Denies neck, hip or leg pain with fall.  Pt c/o no appetite, n/v after eating & significant weight loss (36lbs) past 2 months. States had an endoscopy scheduled for today. Also c/o 2 small BM's daily which are softer than normal & loose x 3-4 days. Denies CP, palpitations, SOB presently. Bleeding to head controlled.
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
## 2013-05-06 NOTE — ED Notes (Signed)
Pt had a syncopal episode & fell this morning. C/o weak & dizziness prior to syncopal episode. Pt was sitting up in chair upon EMS arrival. C/o thoracic back & posterior head pain. Small lac noted to head with bleeding controlled.
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
## 2013-05-06 NOTE — H&P (Signed)
I have seen the patient and reviewed the daily progress note by medical student,  Wynelle Beckmann and discussed the care of the patient with them. Please see my note for documentation of my HPI. Ivor Costa, MD PGY3, Internal Medicine Teaching Service Pager: [phone]

## 2013-05-06 NOTE — ED Notes (Signed)
Myself and Opal Sidles, EMT undressed pt, placed in gown, on monitor, continuous pulse oximetry and blood pressure cuff; c-collar still intact from [facility]
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
## 2013-05-06 NOTE — ED Notes (Signed)
Patient transported to CT 

## 2013-05-07 ENCOUNTER — Encounter (HOSPITAL_COMMUNITY)
Admission: EM | Disposition: A | Discharge status: Skilled Nursing Facility | Payer: Self-pay | Source: Home / Self Care | Attending: Internal Medicine

## 2013-05-07 ENCOUNTER — Encounter (HOSPITAL_COMMUNITY): Payer: Self-pay

## 2013-05-07 DIAGNOSIS — R1314 Dysphagia, pharyngoesophageal phase: Secondary | ICD-10-CM

## 2013-05-07 DIAGNOSIS — R55 Syncope and collapse: Secondary | ICD-10-CM

## 2013-05-07 DIAGNOSIS — E860 Dehydration: Secondary | ICD-10-CM | POA: Diagnosis not present

## 2013-05-07 DIAGNOSIS — E43 Unspecified severe protein-calorie malnutrition: Secondary | ICD-10-CM

## 2013-05-07 DIAGNOSIS — R634 Abnormal weight loss: Secondary | ICD-10-CM

## 2013-05-07 DIAGNOSIS — E119 Type 2 diabetes mellitus without complications: Secondary | ICD-10-CM

## 2013-05-07 DIAGNOSIS — R1012 Left upper quadrant pain: Secondary | ICD-10-CM

## 2013-05-07 DIAGNOSIS — W19XXXA Unspecified fall, initial encounter: Secondary | ICD-10-CM

## 2013-05-07 HISTORY — PX: ESOPHAGOGASTRODUODENOSCOPY: SHX5428

## 2013-05-07 LAB — COMPREHENSIVE METABOLIC PANEL
ALT: 13 U/L (ref 0–35)
AST: 16 U/L (ref 0–37)
Albumin: 2.9 g/dL — ABNORMAL LOW (ref 3.5–5.2)
Alkaline Phosphatase: 53 U/L (ref 39–117)
BUN: 53 mg/dL — ABNORMAL HIGH (ref 6–23)
CO2: 26 mEq/L (ref 19–32)
Calcium: 9.3 mg/dL (ref 8.4–10.5)
Chloride: 105 mEq/L (ref 96–112)
Creatinine, Ser: 0.99 mg/dL (ref 0.50–1.10)
GFR calc Af Amer: 60 mL/min — ABNORMAL LOW (ref >90)
GFR calc non Af Amer: 52 mL/min — ABNORMAL LOW (ref >90)
Glucose, Bld: 85 mg/dL (ref 70–99)
Potassium: 4.1 mEq/L (ref 3.5–5.1)
Sodium: 143 mEq/L (ref 135–145)
Total Bilirubin: 0.7 mg/dL (ref 0.3–1.2)
Total Protein: 5.9 g/dL — ABNORMAL LOW (ref 6.0–8.3)

## 2013-05-07 LAB — CBC
HCT: 27.6 % — ABNORMAL LOW (ref 36.0–46.0)
Hemoglobin: 9.7 g/dL — ABNORMAL LOW (ref 12.0–15.0)
MCH: 30.5 pg (ref 26.0–34.0)
MCHC: 35.1 g/dL (ref 30.0–36.0)
MCV: 86.8 fL (ref 78.0–100.0)
Platelets: 192 10*3/uL (ref 150–400)
RBC: 3.18 MIL/uL — ABNORMAL LOW (ref 3.87–5.11)
RDW: 13 % (ref 11.5–15.5)
WBC: 4.9 10*3/uL (ref 4.0–10.5)

## 2013-05-07 LAB — URINE CULTURE: Colony Count: >=100000

## 2013-05-07 LAB — T4, FREE: Free T4: 3.6 ng/dL — ABNORMAL HIGH (ref 0.80–1.80)

## 2013-05-07 LAB — CREATININE, URINE, RANDOM: Creatinine, Urine: 90.33 mg/dL

## 2013-05-07 LAB — PROTIME-INR
INR: 1.13 (ref 0.00–1.49)
Prothrombin Time: 14.3 seconds (ref 11.6–15.2)

## 2013-05-07 LAB — MAGNESIUM: Magnesium: 2.1 mg/dL (ref 1.5–2.5)

## 2013-05-07 LAB — SODIUM, URINE, RANDOM: Sodium, Ur: 47 mEq/L

## 2013-05-07 LAB — TSH: TSH: <0.008 u[IU]/mL — ABNORMAL LOW (ref 0.350–4.500)

## 2013-05-07 SURGERY — EGD (ESOPHAGOGASTRODUODENOSCOPY)
Anesthesia: Moderate Sedation

## 2013-05-07 MED ORDER — MIDAZOLAM HCL 5 MG/ML IJ SOLN
INTRAMUSCULAR | Status: AC
  Filled 2013-05-07: qty 2

## 2013-05-07 MED ORDER — MIDAZOLAM HCL 10 MG/2ML IJ SOLN
INTRAMUSCULAR | Status: DC | PRN
  Administered 2013-05-07 (×3): 1 mg via INTRAVENOUS

## 2013-05-07 MED ORDER — PANTOPRAZOLE SODIUM 40 MG PO TBEC
40.0000 mg | DELAYED_RELEASE_TABLET | Freq: Two times a day (BID) | ORAL | Status: DC
  Administered 2013-05-07 – 2013-05-11 (×9): 40 mg via ORAL
  Filled 2013-05-07 (×8): qty 1

## 2013-05-07 MED ORDER — BOOST / RESOURCE BREEZE PO LIQD
1.0000 | Freq: Three times a day (TID) | ORAL | Status: DC
  Administered 2013-05-07 – 2013-05-11 (×10): 1 via ORAL

## 2013-05-07 MED ORDER — OMEPRAZOLE MAGNESIUM 20 MG PO TBEC: 20.0000 mg | DELAYED_RELEASE_TABLET | Freq: Two times a day (BID) | ORAL | Status: DC

## 2013-05-07 MED ORDER — FENTANYL CITRATE 0.05 MG/ML IJ SOLN
INTRAMUSCULAR | Status: AC
  Filled 2013-05-07: qty 2

## 2013-05-07 MED ORDER — SUCRALFATE 1 G PO TABS
1.0000 g | ORAL_TABLET | Freq: Three times a day (TID) | ORAL | Status: DC
  Administered 2013-05-07 – 2013-05-11 (×17): 1 g via ORAL
  Filled 2013-05-07 (×21): qty 1

## 2013-05-07 MED ORDER — FENTANYL CITRATE 0.05 MG/ML IJ SOLN
INTRAMUSCULAR | Status: DC | PRN
  Administered 2013-05-07: 25 ug via INTRAVENOUS

## 2013-05-07 MED ORDER — SODIUM CHLORIDE 0.9 % IV SOLN
INTRAVENOUS | Status: DC
  Administered 2013-05-07: 500 mL via INTRAVENOUS

## 2013-05-07 NOTE — Interval H&P Note (Signed)
History and Physical Interval Note:  05/07/2013 8:46 AM  Gina [name]  has presented today for surgery, with the diagnosis of Dysphagia and weight loss  The various methods of treatment have been discussed with the patient and family. After consideration of risks, benefits and other options for treatment, the patient has consented to  Procedure(s): ESOPHAGOGASTRODUODENOSCOPY (EGD) (N/A) as a surgical intervention .  The patient's history has been reviewed, patient examined, no change in status, stable for surgery.  I have reviewed the patient's chart and labs.  Questions were answered to the patient's satisfaction.     Zehava Turski D

## 2013-05-07 NOTE — Progress Notes (Signed)
Subjective: -Patient feels better today.  - Tolerated EGD procedure.  There are a bleeding proximal gastric AVM s/p APC and ablation.   Objective: Vital signs in last 24 hours: Filed Vitals:   05/07/13 1042 05/07/13 1046 05/07/13 1328 05/07/13 1723  BP: 157/39 133/60 160/52 138/49  Pulse: 67 73 66 65  Temp:   97.8 F (36.6 C) 98.5 F (36.9 C)  TempSrc:   Oral Oral  Resp: 18  18 17   Height:      Weight:      SpO2: 94%  96% 97%   Weight change:   Intake/Output Summary (Last 24 hours) at 05/07/13 1926 Last data filed at 05/07/13 1600  Gross per 24 hour  Intake 2003.33 ml  Output    200 ml  Net 1803.33 ml    General: Not in acute distress.  HEENT: EOMI, no scleral icterus, laceration on posterior scalp Cardiac: S1/S2, RRR, No murmurs, gallops or rubs   Pulm: Good air movement bilaterally. Clear to auscultation bilaterally. No rales, wheezing, rhonchi or rubs.   Abd: Soft, nondistended, BS present, there is tenderness over RUQ Ext: No edema   Musculoskeletal: No joint deformities, erythema, or stiffness Skin: No rashes.   Neuro: Alert and oriented X3  Lab Results: Basic Metabolic Panel:  Recent Labs Lab 05/06/13 0945 05/07/13 0510  NA 141 143  K 4.8 4.1  CL 98 105  CO2 23 26  GLUCOSE 110* 85  BUN 60* 53*  CREATININE 1.72* 0.99  CALCIUM 10.7* 9.3  MG  --  2.1   Liver Function Tests:  Recent Labs Lab 05/07/13 0510  AST 16  ALT 13  ALKPHOS 53  BILITOT 0.7  PROT 5.9*  ALBUMIN 2.9*   No results found for this basename: LIPASE, AMYLASE,  in the last 168 hours No results found for this basename: AMMONIA,  in the last 168 hours CBC:  Recent Labs Lab 05/06/13 0945 05/07/13 0510  WBC 6.7 4.9  NEUTROABS 5.2  --   HGB 11.7* 9.7*  HCT 33.0* 27.6*  MCV 87.8 86.8  PLT 188 192   Cardiac Enzymes:  Recent Labs Lab 05/06/13 0945 05/06/13 2300  TROPONINI <0.30 <0.30   BNP: No results found for this basename: PROBNP,  in the last 168  hours D-Dimer: No results found for this basename: DDIMER,  in the last 168 hours CBG: No results found for this basename: GLUCAP,  in the last 168 hours Hemoglobin A1C: No results found for this basename: HGBA1C,  in the last 168 hours Fasting Lipid Panel: No results found for this basename: CHOL, HDL, LDLCALC, TRIG, CHOLHDL, LDLDIRECT,  in the last 168 hours Thyroid Function Tests:  Recent Labs Lab 05/06/13 1928  TSH <0.008*  FREET4 3.60*   Coagulation:  Recent Labs Lab 05/07/13 0510  LABPROT 14.3  INR 1.13   Anemia Panel: No results found for this basename: VITAMINB12, FOLATE, FERRITIN, TIBC, IRON, RETICCTPCT,  in the last 168 hours Urine Drug Screen: Drugs of Abuse  No results found for this basename: labopia, cocainscrnur, labbenz, amphetmu, thcu, labbarb    Alcohol Level: No results found for this basename: ETH,  in the last 168 hours Urinalysis:  Recent Labs Lab 05/06/13 1453  COLORURINE YELLOW  LABSPEC 1.015  PHURINE 5.0  GLUCOSEU NEGATIVE  HGBUR NEGATIVE  BILIRUBINUR MODERATE*  KETONESUR 15*  PROTEINUR 30*  UROBILINOGEN 1.0  NITRITE NEGATIVE  LEUKOCYTESUR SMALL*   Misc. Labs:  Micro Results: No results found for this or any previous  visit (from the past 240 hour(s)). Studies/Results: X-ray Chest Pa And Lateral   05/07/2013   *RADIOLOGY REPORT*  Clinical Data: Syncope.  CHEST - 2 VIEW  Comparison: CT chest, abdomen and pelvis 04/04/2013 and chest and two views abdomen 04/09/2013.  Findings: The chest is hyperexpanded compatible with emphysema.  A single punctate calcified granuloma is seen in the left lower lobe. The lungs are otherwise clear.  Heart size is normal.  There is no pneumothorax or pleural fluid.  IMPRESSION: Emphysema without acute disease.   Original Report Authenticated By: Orlean Patten, M.D.   Dg Lumbar Spine 2-3 Views  05/06/2013   *RADIOLOGY REPORT*  Clinical Data: Lower back pain post fall  LUMBAR SPINE - 2-3 VIEW  Comparison:  02/20/2009  Findings: Five non-rib bearing lumbar vertebrae. Osseous demineralization. Facet degenerative changes mid to lower lumbar spine. Subtle superior endplate compression deformity of L1 vertebral body new since previous exam, with approximately 10-15% superior endplate height loss. Remaining vertebrae normal in height and alignment. No additional fracture, subluxation or bone destruction. Scattered atherosclerotic calcifications aorta and iliac arteries. Mild chronic asymmetric widening of the inferior right SI joint with degenerative changes noted on a prior CT.  IMPRESSION: Osseous demineralization. Mild superior endplate compression fracture of L1 with 10-15% anterior height loss. Facet degenerative changes mid-to-lower lumbar spine.   Original Report Authenticated By: Lavonia Dana, M.D.   Ct Head Wo Contrast  05/06/2013   *RADIOLOGY REPORT*  Clinical Data: Fall with head injury.  Posterior laceration.  CT HEAD WITHOUT CONTRAST  Technique:  Contiguous axial images were obtained from the base of the skull through the vertex without contrast.  Comparison: Cervical spine CT 02/24/2009,  Findings: There is no evidence of acute intracranial hemorrhage, mass lesion, brain edema or extra-axial fluid collection.  The ventricles and subarachnoid spaces are appropriately sized for age. There is no CT evidence of acute cortical infarction.  There is minimal periventricular white matter disease for age, likely chronic small vessel ischemic change.  Intracranial vascular calcifications are noted.  The visualized paranasal sinuses, mastoid air cells and middle ears are clear. The calvarium is intact. There is an irregularity of the right nasal bone which does not appear acute.  IMPRESSION:  1.  No acute intracranial or calvarial findings. 2.  Irregularity of the right nasal bone suspected to reflect an old injury.  Correlate clinically.   Original Report Authenticated By: Richardean Sale, M.D.   Ct Cervical Spine Wo  Contrast  05/06/2013   *RADIOLOGY REPORT*  Clinical Data: Fall.  Neck pain  CT CERVICAL SPINE WITHOUT CONTRAST  Technique:  Multidetector CT imaging of the cervical spine was performed. Multiplanar CT image reconstructions were also generated.  Comparison: None  Findings: Normal cervical alignment.  Negative for fracture or mass.  Disc degeneration and spondylosis in the cervical spine. There is facet degeneration most prominent at C4-5.  Mild anterior slip of C4-5.  There is prominent spondylosis on the right at C3-4, C5-6, and C6-7 causing foraminal encroachment  IMPRESSION: Cervical spondylosis.  Negative for fracture.   Original Report Authenticated By: Carl Best, M.D.   Medications:  Scheduled Meds: . amLODipine  10 mg Oral Daily  . atorvastatin  40 mg Oral QHS  . dorzolamide-timolol  1 drop Both Eyes Daily  . feeding supplement  1 Container Oral TID BM  . heparin  5,000 Units Subcutaneous Q8H  . metoprolol tartrate  25 mg Oral BID  . pantoprazole  40 mg Oral BID AC  .  sodium chloride  3 mL Intravenous Q12H  . sucralfate  1 g Oral TID WC & HS  . TDaP  0.5 mL Intramuscular Once  . tobramycin  1 drop Right Eye QID   Continuous Infusions: . sodium chloride 100 mL/hr at 05/07/13 1738   PRN Meds:.HYDROcodone-acetaminophen Assessment/Plan:  #Syncope : Improved signigicant ly. Likely due to dehydration. Another possibility is cardiac arrythmia. She has abnormal EKG with frequent PAC on 04/09/13. She also reports having palpitation recently. Other DD include, but less likely, TIA or stroke (no focal neurological findings on exam and no extremety weakness or sensation change); ACS (EKG has TWI, but no significant change compare to previous EKG 04/09/13); PE (Well's score is low probability), hypoglycemia (not on hypoglycemic medications a home, her CBG=110 on admission) and vasovagal syncope (she did not have any premonitory symptoms before). Condition improved overnight with IVF rehydration.    -PT/OT eval -decrease NS from 100cc/hr to 75 cc/h  -Tele   -Fall precautions   #: HTN: Continue Amlodipine 10mg  and metoprolol 12.5   #Abdominal Pain - likely due to gastric AVMs and gastric ulcers as evidenced by EGD.   -Appreciate GI's consult in managing our pt. -will follow up recs: Pantoprazole BID , Sucralfate TID,  - Await biopsy results. - -f/u scheduled with Dr. Benson Norway for 9:30 Monday 05/24/13   #Multinodular Goiter - [place] positive for multinodular goiter with discreet 1.2cm nodule, TSH and T4 show hyperthyroidism today, however patient is asymptomatic.  -f/u appt made for Tuesday August 12th at 1:30 with PCP (Dr. Denton Brick) for hospital f/u and hyperthyroidism   # Occular issues - taking gentamicin drops for her right eye for "glaucoma" per the patient. Patient sees outside ophthamology, will continue home gentamicin.   # DVT px:  -Heparin 5000 u TID  #: Deposition: plan SNF. SW consulted for looking for Bed.    LOS: 1 day   Ivor Costa 05/07/2013, 7:26 PM

## 2013-05-07 NOTE — Evaluation (Signed)
Physical Therapy Evaluation Patient Details Name: Gina [name] MRN: [id number] DOB: [birth year] Today's Date: 05/07/2013 Time: 1341-1400 PT Time Calculation (min): 19 min  PT Assessment / Plan / Recommendation History of Present Illness  79 year old female who was schedule to have an EGD today, but she experienced a syncopal episode this morning.  She got up from her bed and walked to the door to let her aid into her house and then she collapsed.  There was a brief loss of consciousness.  The work up in the ER was negative for any cerebral trauma or any fractures.  It was felt that she was dehydrated.  She will be admitted to the hospital for further evaluation.  Clinical Impression  Pt very limited due to overall fatigue impairing strength and endurance.  Pt unable to ambulate due to overall fatigue and needed to return to bed.  Pt reported "I'm not comfortable going home like this and I want to know whats going on with me."  Pt will benefit from acute PT services to improve overall mobility and endurance to prepare for safe d/c to next venue.     PT Assessment  Patient needs continued PT services    Follow Up Recommendations  SNF    Equipment Recommendations  Rolling walker with 5" wheels    Frequency Min 3X/week    Precautions / Restrictions Precautions Precautions: Fall   Pertinent Vitals/Pain No c/o pain      Mobility  Bed Mobility Bed Mobility: Supine to Sit;Sitting - Scoot to [employer] of Bed;Sit to Supine Supine to Sit: 4: Min assist;With rails;HOB elevated Sitting - Scoot to [employer] of Bed: 4: Min assist;With rail Sit to Supine: 4: Min assist;HOB flat Details for Bed Mobility Assistance: (A) to elevate trunk OOB with cues for technique.   Transfers Transfers: Sit to Stand;Stand to Sit Sit to Stand: 4: Min assist;From bed Stand to Sit: 4: Min assist;To bed Details for Transfer Assistance: (A) to initiate transfer and slowly descend to bed with cues for hand  placement Ambulation/Gait Ambulation/Gait Assistance: 4: Min assist Ambulation Distance (Feet): 4 Feet (side steps) Assistive device: 1 person hand held assist Ambulation/Gait Assistance Details: (A) to maintain balance and encourage side step to [facility].  Pt fatigues quickly and ready to return to sitting Gait Pattern: Step-to pattern;Decreased stride length;Shuffle Stairs: No    Exercises     PT Diagnosis: Difficulty walking;Generalized weakness  PT Problem List: Decreased strength;Decreased activity tolerance;Decreased mobility;Decreased balance;Decreased knowledge of use of DME PT Treatment Interventions: DME instruction;Gait training;Functional mobility training;Therapeutic activities;Therapeutic exercise;Balance training;Patient/family education     PT Goals(Current goals can be found in the care plan section) Acute Rehab PT Goals Patient Stated Goal: To get stronger and figure out whats going on PT Goal Formulation: With patient Time For Goal Achievement: 05/14/13 Potential to Achieve Goals: Good  Visit Information  Last PT Received On: 05/07/13 Assistance Needed: +2 (safety & increase dist) History of Present Illness: 79 year old female who was schedule to have an EGD today, but she experienced a syncopal episode this morning.  She got up from her bed and walked to the door to let her aid into her house and then she collapsed.  There was a brief loss of consciousness.  The work up in the ER was negative for any cerebral trauma or any fractures.  It was felt that she was dehydrated.  She will be admitted to the hospital for further evaluation.  Prior [place] expects to be discharged to:: Private residence (apartment) Living Arrangements: Alone Available Help at Discharge: Other (Comment) ([place] aide 5x/wk 2-3hrs) Type of Home: Apartment Home Access: Level entry Home Layout: One level Home Equipment: Cane - single point;Shower seat;Walker -  standard Prior Function Level of Independence: Needs assistance Gait / Transfers Assistance Needed: ambulates without AD in home; community takes cane ADL's / Homemaking Assistance Needed: [place] aide assist with cooking and cleaning Communication Communication: No difficulties Dominant Hand: Right    Cognition  Cognition Arousal/Alertness: Awake/alert Behavior During Therapy: WFL for tasks assessed/performed Overall Cognitive Status: Within Functional Limits for tasks assessed    Extremity/Trunk Assessment Lower Extremity Assessment Lower Extremity Assessment: Generalized weakness   Balance Balance Balance Assessed: Yes Static Sitting Balance Static Sitting - Balance Support: Feet supported Static Sitting - Level of Assistance: 5: Stand by assistance  End of Session PT - End of Session Equipment Utilized During Treatment: Gait belt Activity Tolerance: Patient limited by fatigue Patient left: in bed;with call bell/phone within reach Nurse Communication: Mobility status  GP     Jarryn Altland 05/07/2013, 3:49 PM  Antoine Poche, [place] DPT [phone]
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
## 2013-05-07 NOTE — Op Note (Signed)
[facility] [place], [address]   OPERATIVE PROCEDURE REPORT  PATIENT: Gina [name], Gina [name].  MR#: [id number] BIRTHDATE: [birth year]  GENDER: Female ENDOSCOPIST: Carol Ada, MD ASSISTANT:   Roxanne Mins, technician and Alcide Clever, RN, CGRN PROCEDURE DATE: 05/07/2013 PROCEDURE: ASA CLASS:   Class III INDICATIONS: Dysphagia, Anemia, ABM pain MEDICATIONS: Fentanyl 50 mcg IV and Versed 4 mg IV TOPICAL ANESTHETIC:   none  DESCRIPTION OF PROCEDURE:   After the risks benefits and alternatives of the procedure were thoroughly explained, informed consent was obtained.  The Pentax Gastroscope [id number]  endoscope was introduced through the mouth  and advanced to the second portion of the duodenum Without limitations.      The instrument was slowly withdrawn as the mucosa was fully examined.   FINDINGS: The esophagus was normal.  No evidence of inflammation, strictures, or ulcerations.  Biopsies of the mid to upper esophagus were obtained for eosinophilic esophagitis.  In the proximal gastric body a bleeding AVM was identified.  Further examination of the region also revealed two small faint nonbleeding AVMs.  All the AVMs were ablated with APC, but the largest AVM required two hemoclips to completely arrest the bleeding.  In the distal gastric body and antrum a couple of linear, nonbleeding ulcers were identified.  Cold biopsies were obtained to evaluate for H. pylori. These ulcers were not malignant in appearance.  The duodenum was normal.          The scope was then withdrawn from the patient and the procedure terminated. COMPLICATIONS: There were no complications.  IMPRESSION: 1) Bleeding proximal gastric AVM s/p APC and ablation. 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 3) Gastric ulcers.  RECOMMENDATIONS: 1) PPI BID. 2) Sucralfate TID. 3) Await biopsy results. 4) Follow up in two  weeks.  _______________________________ eSignedCarol Ada, MD 05/07/2013 9:30 AM

## 2013-05-07 NOTE — Progress Notes (Signed)
I have seen the patient and reviewed the daily progress note by medical student,  Josh and discussed the care of the patient with them. Please see my note for documentation of my findings, assessment, and plans  Ivor Costa, MD PGY3, Internal Medicine Teaching Service Pager: [phone]

## 2013-05-07 NOTE — H&P (Signed)
Date: 05/07/2013  Patient name: Gina [name]  Medical record number: [id number]  Date of birth: 1929/06/29   I have seen and evaluated Gina [name] and discussed their care with the Residency Team.  Gina [name] is a pleasant 79yo F who reports significant weight loss of #25 since June in the setting of also having dysphagia to solid foods. Due to poor po intake and progressive weakness, she had a syncopal episode that brought her to the ED. She was admitted for management of dehydration, severe protein malnutrition, evaluation of dysphagia and weight loss.  Physical Exam: Blood pressure 160/52, pulse 66, temperature 97.8 F (36.6 C), temperature source Oral, resp. rate 18, height 5\' 7"  (1.702 m), weight 97 lb (43.999 kg), SpO2 96.00%. General: a xo by 3, in no acute distress. Chronically ill appearing HEENT: EOMI, no scleral icterus, laceration to occiptal head, proximately 2x2 cm  Cardiac: S1/S2, RRR, No murmurs, gallops or rubs  Pulm: Good air movement bilaterally. Clear to auscultation bilaterally. No rales, wheezing, rhonchi or rubs.  Abd: Soft, nondistended, BS present, tenderness to palpation in suprapubic region. No guarding or organomegaly.  Ext: No edema  Musculoskeletal: No joint deformities, erythema, or stiffness, ROM full  Skin: No rashes.  Neuro: Alert and oriented X3, cranial nerves II-XII grossly intact, muscle strength 4/5 in all extremeties, sensation to light touch intact. Brachial reflex 2+ bilaterally. Knee reflex 2+ bilaterally. Normal finger to nose test. Normal rapid alternating hand movements  Psych: appropriate  Lab results: Results for orders placed during the hospital encounter of 05/06/13 (from the past 24 hour(s))  TSH     Status: Abnormal   Collection Time    05/06/13  7:28 PM      Result Value Range   TSH <0.008 (*) 0.350 - 4.500 uIU/mL  T4, FREE     Status: Abnormal   Collection Time    05/06/13  7:28 PM      Result Value Range   Free T4 3.60  (*) 0.80 - 1.80 ng/dL  TROPONIN I     Status: None   Collection Time    05/06/13 11:00 PM      Result Value Range   Troponin I <0.30  <0.30 ng/mL  COMPREHENSIVE METABOLIC PANEL     Status: Abnormal   Collection Time    05/07/13  5:10 AM      Result Value Range   Sodium 143  135 - 145 mEq/L   Potassium 4.1  3.5 - 5.1 mEq/L   Chloride 105  96 - 112 mEq/L   CO2 26  19 - 32 mEq/L   Glucose, Bld 85  70 - 99 mg/dL   BUN 53 (*) 6 - 23 mg/dL   Creatinine, Ser 0.99  0.50 - 1.10 mg/dL   Calcium 9.3  8.4 - 10.5 mg/dL   Total Protein 5.9 (*) 6.0 - 8.3 g/dL   Albumin 2.9 (*) 3.5 - 5.2 g/dL   AST 16  0 - 37 U/L   ALT 13  0 - 35 U/L   Alkaline Phosphatase 53  39 - 117 U/L   Total Bilirubin 0.7  0.3 - 1.2 mg/dL   GFR calc non Af Amer 52 (*) >90 mL/min   GFR calc Af Amer 60 (*) >90 mL/min  CBC     Status: Abnormal   Collection Time    05/07/13  5:10 AM      Result Value Range   WBC 4.9  4.0 - 10.5 K/uL  RBC 3.18 (*) 3.87 - 5.11 MIL/uL   Hemoglobin 9.7 (*) 12.0 - 15.0 g/dL   HCT 27.6 (*) 36.0 - 46.0 %   MCV 86.8  78.0 - 100.0 fL   MCH 30.5  26.0 - 34.0 pg   MCHC 35.1  30.0 - 36.0 g/dL   RDW 13.0  11.5 - 15.5 %   Platelets 192  150 - 400 K/uL  MAGNESIUM     Status: None   Collection Time    05/07/13  5:10 AM      Result Value Range   Magnesium 2.1  1.5 - 2.5 mg/dL  PROTIME-INR     Status: None   Collection Time    05/07/13  5:10 AM      Result Value Range   Prothrombin Time 14.3  11.6 - 15.2 seconds   INR 1.13  0.00 - 1.49  CREATININE, URINE, RANDOM     Status: None   Collection Time    05/07/13 10:08 AM      Result Value Range   Creatinine, Urine 90.33    SODIUM, URINE, RANDOM     Status: None   Collection Time    05/07/13 10:08 AM      Result Value Range   Sodium, Ur 47      Imaging results:  X-ray Chest Pa And Lateral   05/07/2013   *RADIOLOGY REPORT*  Clinical Data: Syncope.  CHEST - 2 VIEW  Comparison: CT chest, abdomen and pelvis 04/04/2013 and chest and two views  abdomen 04/09/2013.  Findings: The chest is hyperexpanded compatible with emphysema.  A single punctate calcified granuloma is seen in the left lower lobe. The lungs are otherwise clear.  Heart size is normal.  There is no pneumothorax or pleural fluid.  IMPRESSION: Emphysema without acute disease.   Original Report Authenticated By: Orlean Patten, M.D.   Dg Lumbar Spine 2-3 Views  05/06/2013   *RADIOLOGY REPORT*  Clinical Data: Lower back pain post fall  LUMBAR SPINE - 2-3 VIEW  Comparison: 02/20/2009  Findings: Five non-rib bearing lumbar vertebrae. Osseous demineralization. Facet degenerative changes mid to lower lumbar spine. Subtle superior endplate compression deformity of L1 vertebral body new since previous exam, with approximately 10-15% superior endplate height loss. Remaining vertebrae normal in height and alignment. No additional fracture, subluxation or bone destruction. Scattered atherosclerotic calcifications aorta and iliac arteries. Mild chronic asymmetric widening of the inferior right SI joint with degenerative changes noted on a prior CT.  IMPRESSION: Osseous demineralization. Mild superior endplate compression fracture of L1 with 10-15% anterior height loss. Facet degenerative changes mid-to-lower lumbar spine.   Original Report Authenticated By: Lavonia Dana, M.D.   Ct Head Wo Contrast  05/06/2013   *RADIOLOGY REPORT*  Clinical Data: Fall with head injury.  Posterior laceration.  CT HEAD WITHOUT CONTRAST  Technique:  Contiguous axial images were obtained from the base of the skull through the vertex without contrast.  Comparison: Cervical spine CT 02/24/2009,  Findings: There is no evidence of acute intracranial hemorrhage, mass lesion, brain edema or extra-axial fluid collection.  The ventricles and subarachnoid spaces are appropriately sized for age. There is no CT evidence of acute cortical infarction.  There is minimal periventricular white matter disease for age, likely chronic small  vessel ischemic change.  Intracranial vascular calcifications are noted.  The visualized paranasal sinuses, mastoid air cells and middle ears are clear. The calvarium is intact. There is an irregularity of the right nasal bone which does not appear acute.  IMPRESSION:  1.  No acute intracranial or calvarial findings. 2.  Irregularity of the right nasal bone suspected to reflect an old injury.  Correlate clinically.   Original Report Authenticated By: Richardean Sale, M.D.   Ct Cervical Spine Wo Contrast  05/06/2013   *RADIOLOGY REPORT*  Clinical Data: Fall.  Neck pain  CT CERVICAL SPINE WITHOUT CONTRAST  Technique:  Multidetector CT imaging of the cervical spine was performed. Multiplanar CT image reconstructions were also generated.  Comparison: None  Findings: Normal cervical alignment.  Negative for fracture or mass.  Disc degeneration and spondylosis in the cervical spine. There is facet degeneration most prominent at C4-5.  Mild anterior slip of C4-5.  There is prominent spondylosis on the right at C3-4, C5-6, and C6-7 causing foraminal encroachment  IMPRESSION: Cervical spondylosis.  Negative for fracture.   Original Report Authenticated By: Carl Best, M.D.    Assessment and Plan: I have seen and evaluated the patient as outlined above. I agree with the formulated Assessment and Plan as detailed in the residents' admission note, with the following changes:   Will discuss EGD findings with Dr. Benson Norway to see if it explains her dysphagia, in addition will also discuss need for colonoscopy since patient has not had recent CSY since 2010 and recent weight loss of #25 with b-symptoms.  Carlyle Basques, MD 8/8/20143:56 PM

## 2013-05-07 NOTE — Progress Notes (Signed)
Utilization Review Completed Rhylee Nunn J. Kenyada Hy, RN, BSN, NCM [phone]  

## 2013-05-07 NOTE — Progress Notes (Signed)
Pt not urinating  in hat. Added 2nd hat to catch urine

## 2013-05-07 NOTE — ED Provider Notes (Signed)
I saw and evaluated the patient, reviewed the resident's note and I agree with the findings and plan. Agree with EKG interpretation if present.   Pt with syncope on standing. Has had poor PO intake recently. Head injury but imaging neg. Labs show acute renal failure, likely pre-renal given decreased intake and elevated BUN. Admit for hydration.   Charles B. Karle Starch, MD 05/07/13 [id number]

## 2013-05-07 NOTE — Progress Notes (Signed)
Pt put on c. Diff precautions d/t order for c. Diff stool/labs test

## 2013-05-07 NOTE — Progress Notes (Signed)
Pt O4x, ENDO transporting for EGD. Iv s/l for transport Per endo request

## 2013-05-07 NOTE — Progress Notes (Signed)
Gina [name] is a 79 y.o. woman with a history of partial right mastectomy for intraductal ca, T2 DM, HTN, Hyperlipidemia, peripheral vascular disease, and TB who presents to the emergency department c/o syncope and a fall.  Subjective: NAEON.  Patient seen upon returning from EGD with Dr. Benson Norway.  She reports decreased abdominal pain and is asking if she can eat.  New diagnosis of toxic multinodular goiter were discussed with patient.  Objective: Vital signs in last 24 hours: Filed Vitals:   05/07/13 0935 05/07/13 0940 05/07/13 1042 05/07/13 1046  BP: 150/43 149/43 157/39 133/60  Pulse:   67 73  Temp:      TempSrc:      Resp: 18 45 18   Height:      Weight:      SpO2: 100% 100% 94%    Weight change:   Intake/Output Summary (Last 24 hours) at 05/07/13 1328 Last data filed at 05/07/13 0835  Gross per 24 hour  Intake   1170 ml  Output      0 ml  Net   1170 ml   Physical Exam: General: Not in acute distress  HEENT: EOMI, no scleral icterus, laceration on posterior scalp Cardiac: S1/S2, RRR, No murmurs, gallops or rubs  Pulm: Good air movement bilaterally. Clear to auscultation bilaterally. No rales, wheezing, rhonchi or rubs.  Abd: Soft, nondistended, BS present, tenderness to palpation in RUQ diminished compared to yesterday, no tenderness to palpation on left side. Ext: No edema  Musculoskeletal: No joint deformities, erythema, or stiffness Skin: No rashes.  Neuro: Alert and oriented X3 Psych: Patient is not psychotic.   Lab Results: BMET    Component Value Date/Time   NA 143 05/07/2013 0510   K 4.1 05/07/2013 0510   CL 105 05/07/2013 0510   CO2 26 05/07/2013 0510   GLUCOSE 85 05/07/2013 0510   BUN 53* 05/07/2013 0510   CREATININE 0.99 05/07/2013 0510   CREATININE 0.62 03/04/2013 1700   CALCIUM 9.3 05/07/2013 0510   GFRNONAA 52* 05/07/2013 0510   GFRAA 60* 05/07/2013 0510    CBC    Component Value Date/Time   WBC 4.9 05/07/2013 0510   RBC 3.18* 05/07/2013 0510   HGB 9.7*  05/07/2013 0510   HCT 27.6* 05/07/2013 0510   PLT 192 05/07/2013 0510   MCV 86.8 05/07/2013 0510   MCH 30.5 05/07/2013 0510   MCHC 35.1 05/07/2013 0510   RDW 13.0 05/07/2013 0510   LYMPHSABS 0.9 05/06/2013 0945   MONOABS 0.5 05/06/2013 0945   EOSABS 0.0 05/06/2013 0945   BASOSABS 0.0 05/06/2013 0945    TSH = 0.008 Free T4 = 3.60  Micro Results: No results found for this or any previous visit (from the past 240 hour(s)). Studies/Results: X-ray Chest Pa And Lateral   05/07/2013   *RADIOLOGY REPORT*  Clinical Data: Syncope.  CHEST - 2 VIEW  Comparison: CT chest, abdomen and pelvis 04/04/2013 and chest and two views abdomen 04/09/2013.  Findings: The chest is hyperexpanded compatible with emphysema.  A single punctate calcified granuloma is seen in the left lower lobe. The lungs are otherwise clear.  Heart size is normal.  There is no pneumothorax or pleural fluid.  IMPRESSION: Emphysema without acute disease.   Original Report Authenticated By: Orlean Patten, M.D.   Dg Lumbar Spine 2-3 Views  05/06/2013   *RADIOLOGY REPORT*  Clinical Data: Lower back pain post fall  LUMBAR SPINE - 2-3 VIEW  Comparison: 02/20/2009  Findings: Five non-rib bearing lumbar  vertebrae. Osseous demineralization. Facet degenerative changes mid to lower lumbar spine. Subtle superior endplate compression deformity of L1 vertebral body new since previous exam, with approximately 10-15% superior endplate height loss. Remaining vertebrae normal in height and alignment. No additional fracture, subluxation or bone destruction. Scattered atherosclerotic calcifications aorta and iliac arteries. Mild chronic asymmetric widening of the inferior right SI joint with degenerative changes noted on a prior CT.  IMPRESSION: Osseous demineralization. Mild superior endplate compression fracture of L1 with 10-15% anterior height loss. Facet degenerative changes mid-to-lower lumbar spine.   Original Report Authenticated By: Lavonia Dana, M.D.   Ct Head Wo  Contrast  05/06/2013   *RADIOLOGY REPORT*  Clinical Data: Fall with head injury.  Posterior laceration.  CT HEAD WITHOUT CONTRAST  Technique:  Contiguous axial images were obtained from the base of the skull through the vertex without contrast.  Comparison: Cervical spine CT 02/24/2009,  Findings: There is no evidence of acute intracranial hemorrhage, mass lesion, brain edema or extra-axial fluid collection.  The ventricles and subarachnoid spaces are appropriately sized for age. There is no CT evidence of acute cortical infarction.  There is minimal periventricular white matter disease for age, likely chronic small vessel ischemic change.  Intracranial vascular calcifications are noted.  The visualized paranasal sinuses, mastoid air cells and middle ears are clear. The calvarium is intact. There is an irregularity of the right nasal bone which does not appear acute.  IMPRESSION:  1.  No acute intracranial or calvarial findings. 2.  Irregularity of the right nasal bone suspected to reflect an old injury.  Correlate clinically.   Original Report Authenticated By: Richardean Sale, M.D.   Ct Cervical Spine Wo Contrast  05/06/2013   *RADIOLOGY REPORT*  Clinical Data: Fall.  Neck pain  CT CERVICAL SPINE WITHOUT CONTRAST  Technique:  Multidetector CT imaging of the cervical spine was performed. Multiplanar CT image reconstructions were also generated.  Comparison: None  Findings: Normal cervical alignment.  Negative for fracture or mass.  Disc degeneration and spondylosis in the cervical spine. There is facet degeneration most prominent at C4-5.  Mild anterior slip of C4-5.  There is prominent spondylosis on the right at C3-4, C5-6, and C6-7 causing foraminal encroachment  IMPRESSION: Cervical spondylosis.  Negative for fracture.   Original Report Authenticated By: Carl Best, M.D.   EGD findings:  IMPRESSION:  1) Bleeding proximal gastric AVM s/p APC and ablation.  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    3) Gastric ulcers.  RECOMMENDATIONS:  1) PPI BID.  2) Sucralfate TID.  3) Await biopsy results.  4) Follow up in two weeks.  Medications: I have reviewed the patient's current medications. Scheduled Meds:  amLODipine  10 mg Oral Daily   atorvastatin  40 mg Oral QHS   dorzolamide-timolol  1 drop Both Eyes Daily   heparin  5,000 Units Subcutaneous Q8H   metoprolol tartrate  25 mg Oral BID   pantoprazole  40 mg Oral BID AC   sodium chloride  3 mL Intravenous Q12H   sucralfate  1 g Oral TID WC & HS   TDaP  0.5 mL Intramuscular Once   tobramycin  1 drop Right Eye QID   Continuous Infusions:  sodium chloride 100 mL/hr at 05/07/13 1004   PRN Meds:.HYDROcodone-acetaminophen Assessment/Plan:  #Syncope - patient significantly dehydrated on admission reporting symptoms of dizziness with rising.  Condition improved overnight with IVF rehydration.  Unable to complete orthostatic vitals due to inability to stand.  -PT/OT eval  for possible discharge today -NS 100cc/hr  -Tele  - Continue Amlodipine 10mg  and metoprolol 12.5 -Fall precautions  #Abdominal Pain - Chronic mesenteric ischemia is probable in this patient with probable stenosis of SMA on CT (04/09/13) and previous occult blood found in stool. Patient was treated with Keflex in past for possible UTI/pyelo with no clinical improvement.  -EGD shows bleeding gastric AVMs, which have been ablated by Dr. Benson Norway. -Sucralfate TID with meals and bedtime -Pantoprazole BID -f/u scheduled with Dr. Benson Norway for 9:30 Monday 05/24/13 #Multinodular Goiter - [place] positive for multinodular goiter with discreet 1.2cm nodule, TSH and T4 show hyperthyroidism today, however patient is asymptomatic. -f/u appt made for Tuesday August 12th at 1:30 with PCP (Dr. Denton Brick) for hospital f/u and hyperthyroidism # Occular issues - taking gentamicin drops for her right eye for "glaucoma" per the patient. Patient sees outside ophthamology, will continue home  gentamicin.  # F/E/N  Diet: Thin fluids # DVT px:  -Heparin 5000 u TID   This is a Careers information officer Note.  The care of the patient was discussed with Dr. Baxter Flattery and the assessment and plan formulated with their assistance.  Please see their attached note for official documentation of the daily encounter.   LOS: 1 day   [facility] Provider Link Found] 05/07/2013, 1:28 PM
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
## 2013-05-08 LAB — BASIC METABOLIC PANEL
BUN: 31 mg/dL — ABNORMAL HIGH (ref 6–23)
CO2: 24 mEq/L (ref 19–32)
Calcium: 9.2 mg/dL (ref 8.4–10.5)
Chloride: 108 mEq/L (ref 96–112)
Creatinine, Ser: 0.71 mg/dL (ref 0.50–1.10)
GFR calc Af Amer: >90 mL/min (ref >90)
GFR calc non Af Amer: 79 mL/min — ABNORMAL LOW (ref >90)
Glucose, Bld: 136 mg/dL — ABNORMAL HIGH (ref 70–99)
Potassium: 3.4 mEq/L — ABNORMAL LOW (ref 3.5–5.1)
Sodium: 141 mEq/L (ref 135–145)

## 2013-05-08 LAB — CBC
HCT: 27.6 % — ABNORMAL LOW (ref 36.0–46.0)
Hemoglobin: 9.4 g/dL — ABNORMAL LOW (ref 12.0–15.0)
MCH: 29.9 pg (ref 26.0–34.0)
MCHC: 34.1 g/dL (ref 30.0–36.0)
MCV: 87.9 fL (ref 78.0–100.0)
Platelets: 180 10*3/uL (ref 150–400)
RBC: 3.14 MIL/uL — ABNORMAL LOW (ref 3.87–5.11)
RDW: 13.3 % (ref 11.5–15.5)
WBC: 4.7 10*3/uL (ref 4.0–10.5)

## 2013-05-08 LAB — PHOSPHORUS: Phosphorus: 2.9 mg/dL (ref 2.3–4.6)

## 2013-05-08 LAB — MAGNESIUM: Magnesium: 1.7 mg/dL (ref 1.5–2.5)

## 2013-05-08 MED ORDER — DOCUSATE SODIUM 100 MG PO CAPS
100.0000 mg | ORAL_CAPSULE | Freq: Every day | ORAL | Status: DC
  Administered 2013-05-08 – 2013-05-11 (×4): 100 mg via ORAL
  Filled 2013-05-08 (×4): qty 1

## 2013-05-08 MED ORDER — POTASSIUM CHLORIDE CRYS ER 20 MEQ PO TBCR
40.0000 meq | EXTENDED_RELEASE_TABLET | Freq: Once | ORAL | Status: AC
  Administered 2013-05-08: 40 meq via ORAL
  Filled 2013-05-08: qty 2

## 2013-05-08 NOTE — Progress Notes (Addendum)
Pt O4x. No compliant of pain or sob. Pt given 40emp of k d/t k of 3.4 and start on colace d/t last bm on 8-5 Will continue to monitor Pt.   Social work page to come see Pt on SNF placement

## 2013-05-08 NOTE — Progress Notes (Signed)
Subjective: - Patient feels better today.  - Tolerated EGD procedure. There are a bleeding proximal gastric AVM s/p APC and ablation.  - Patient has did not have BW since 8/5  Objective: Vital signs in last 24 hours: Filed Vitals:   05/07/13 2005 05/08/13 0443 05/08/13 0900 05/08/13 1300  BP: 139/39 141/45 144/46 127/52  Pulse: 79 86 72 65  Temp: 98.4 F (36.9 C) 98.6 F (37 C) 97.9 F (36.6 C) 98.3 F (36.8 C)  TempSrc: Oral Oral Oral Oral  Resp: 18 18 20 20   Height:      Weight:  103 lb 1.6 oz (46.766 kg)    SpO2: 99% 92% 97% 100%   Weight change: 5 lb 10.5 oz (2.566 kg)  Intake/Output Summary (Last 24 hours) at 05/08/13 1503 Last data filed at 05/08/13 1300  Gross per 24 hour  Intake 2871.66 ml  Output    850 ml  Net 2021.66 ml    General: Not in acute distress.  HEENT: EOMI, no scleral icterus, laceration on posterior scalp Cardiac: S1/S2, RRR, No murmurs, gallops or rubs   Pulm: Good air movement bilaterally. Clear to auscultation bilaterally. No rales, wheezing, rhonchi or rubs.   Abd: Soft, nondistended, BS present, there is tenderness over RUQ Ext: No edema   Musculoskeletal: No joint deformities, erythema, or stiffness Skin: No rashes.   Neuro: Alert and oriented X3  Lab Results: Basic Metabolic Panel:  Recent Labs Lab 05/07/13 0510 05/08/13 0630  NA 143 141  K 4.1 3.4*  CL 105 108  CO2 26 24  GLUCOSE 85 136*  BUN 53* 31*  CREATININE 0.99 0.71  CALCIUM 9.3 9.2  MG 2.1 1.7  PHOS  --  2.9   Liver Function Tests:  Recent Labs Lab 05/07/13 0510  AST 16  ALT 13  ALKPHOS 53  BILITOT 0.7  PROT 5.9*  ALBUMIN 2.9*   No results found for this basename: LIPASE, AMYLASE,  in the last 168 hours No results found for this basename: AMMONIA,  in the last 168 hours CBC:  Recent Labs Lab 05/06/13 0945 05/07/13 0510 05/08/13 0630  WBC 6.7 4.9 4.7  NEUTROABS 5.2  --   --   HGB 11.7* 9.7* 9.4*  HCT 33.0* 27.6* 27.6*  MCV 87.8 86.8 87.9  PLT  188 192 180   Cardiac Enzymes:  Recent Labs Lab 05/06/13 0945 05/06/13 2300  TROPONINI <0.30 <0.30   BNP: No results found for this basename: PROBNP,  in the last 168 hours D-Dimer: No results found for this basename: DDIMER,  in the last 168 hours CBG: No results found for this basename: GLUCAP,  in the last 168 hours Hemoglobin A1C: No results found for this basename: HGBA1C,  in the last 168 hours Fasting Lipid Panel: No results found for this basename: CHOL, HDL, LDLCALC, TRIG, CHOLHDL, LDLDIRECT,  in the last 168 hours Thyroid Function Tests:  Recent Labs Lab 05/06/13 1928  TSH <0.008*  FREET4 3.60*   Coagulation:  Recent Labs Lab 05/07/13 0510  LABPROT 14.3  INR 1.13   Anemia Panel: No results found for this basename: VITAMINB12, FOLATE, FERRITIN, TIBC, IRON, RETICCTPCT,  in the last 168 hours Urine Drug Screen: Drugs of Abuse  No results found for this basename: labopia,  cocainscrnur,  labbenz,  amphetmu,  thcu,  labbarb    Alcohol Level: No results found for this basename: ETH,  in the last 168 hours Urinalysis:  Recent Labs Lab 05/06/13 [place]  LABSPEC 1.015  PHURINE 5.0  GLUCOSEU NEGATIVE  HGBUR NEGATIVE  BILIRUBINUR MODERATE*  KETONESUR 15*  PROTEINUR 30*  UROBILINOGEN 1.0  NITRITE NEGATIVE  LEUKOCYTESUR SMALL*   Misc. Labs:  Micro Results: Recent Results (from the past 240 hour(s))  URINE CULTURE     Status: None   Collection Time    05/06/13  2:53 PM      Result Value Range Status   Specimen Description URINE, CLEAN CATCH   Final   Special Requests NONE   Final   Culture  Setup Time     Final   Value: 05/06/2013 16:26     Performed at [facility]     Final   Value: >=100,000 COLONIES/ML     Performed at [facility]   Culture     Final   Value: Multiple bacterial morphotypes present, none predominant. Suggest appropriate recollection if clinically indicated.     Performed at  [facility]   Report Status 05/07/2013 FINAL   Final   Studies/Results: X-ray Chest Pa And Lateral   05/07/2013   *RADIOLOGY REPORT*  Clinical Data: Syncope.  CHEST - 2 VIEW  Comparison: CT chest, abdomen and pelvis 04/04/2013 and chest and two views abdomen 04/09/2013.  Findings: The chest is hyperexpanded compatible with emphysema.  A single punctate calcified granuloma is seen in the left lower lobe. The lungs are otherwise clear.  Heart size is normal.  There is no pneumothorax or pleural fluid.  IMPRESSION: Emphysema without acute disease.   Original Report Authenticated By: Orlean Patten, M.D.   Medications:  Scheduled Meds: . amLODipine  10 mg Oral Daily  . atorvastatin  40 mg Oral QHS  . docusate sodium  100 mg Oral Daily  . dorzolamide-timolol  1 drop Both Eyes Daily  . feeding supplement  1 Container Oral TID BM  . heparin  5,000 Units Subcutaneous Q8H  . metoprolol tartrate  25 mg Oral BID  . pantoprazole  40 mg Oral BID AC  . sodium chloride  3 mL Intravenous Q12H  . sucralfate  1 g Oral TID WC & HS  . TDaP  0.5 mL Intramuscular Once  . tobramycin  1 drop Right Eye QID   Continuous Infusions: . sodium chloride 75 mL/hr at 05/08/13 0554   PRN Meds:.HYDROcodone-acetaminophen Assessment/Plan:  #Syncope : Improved signigicant ly. Likely due to dehydration. Another possibility is cardiac arrythmia. She has abnormal EKG with frequent PAC on 04/09/13. She also reports having palpitation recently. Other DD include, but less likely, TIA or stroke (no focal neurological findings on exam and no extremety weakness or sensation change); ACS (EKG has TWI, but no significant change compare to previous EKG 04/09/13); PE (Well's score is low probability), hypoglycemia (not on hypoglycemic medications a home, her CBG=110 on admission) and vasovagal syncope (she did not have any premonitory symptoms before). Condition improved overnight with IVF rehydration.   -PT/OT eval -decrease  NS from 100cc/hr to 75 cc/h  -Tele  -Fall precautions   #: HTN: bp 127/52 mmHg. Continue Amlodipine 10mg  and metoprolol 12.5   #Abdominal Pain - likely due to gastric AVMs and gastric ulcers as evidenced by EGD.   -Appreciate GI's consult in managing our pt. -will follow up recs: Pantoprazole BID , Sucralfate TID,  - Await biopsy results. - - f/u scheduled with Dr. Benson Norway for 9:30 Monday 05/24/13   #Multinodular Goiter - [place] positive for multinodular goiter with discreet 1.2cm nodule, TSH and T4  show hyperthyroidism today, however patient is asymptomatic.  -f/u appt made for Tuesday August 12th at 1:30 with PCP (Dr. Denton Brick) for hospital f/u and hyperthyroidism   # Occular issues - taking gentamicin drops for her right eye for "glaucoma" per the patient. Patient sees outside ophthamology, will continue home gentamicin.   # DVT px:  -Heparin 5000 u TID  #: Deposition: plan SNF. SW consulted for looking for Bed.    LOS: 2 days   Ivor Costa 05/08/2013, 3:03 PM

## 2013-05-09 MED ORDER — POTASSIUM CHLORIDE CRYS ER 20 MEQ PO TBCR
20.0000 meq | EXTENDED_RELEASE_TABLET | Freq: Once | ORAL | Status: AC
  Administered 2013-05-09: 20 meq via ORAL
  Filled 2013-05-09: qty 1

## 2013-05-09 MED ORDER — POLYETHYLENE GLYCOL 3350 17 G PO PACK
17.0000 g | PACK | Freq: Once | ORAL | Status: AC
  Administered 2013-05-09: 17 g via ORAL
  Filled 2013-05-09: qty 1

## 2013-05-09 NOTE — Progress Notes (Signed)
Pt in bed with grand daughter at bedside, no complaints of pain or sob. Will continue to monitor Pt

## 2013-05-09 NOTE — Progress Notes (Signed)
Clinical Social Work Department CLINICAL SOCIAL WORK PLACEMENT NOTE 05/09/2013  Patient:  Gina [name], Gina [name]  Account Number:  [id number] Admit date:  05/06/2013  Clinical Social Worker:  Caren Hazy, LCSW  Date/time:  05/09/2013 12:00 M  Clinical Social Work is seeking post-discharge placement for this patient at the following level of care:   [place]   (*CSW will update this form in Epic as items are completed)   05/09/2013  Patient/family provided with [facility] Department of Clinical Social Work's list of facilities offering this level of care within the geographic area requested by the patient (or if unable, by the patient's family).  05/09/2013  Patient/family informed of their freedom to choose among providers that offer the needed level of care, that participate in Medicare, Medicaid or managed care program needed by the patient, have an available bed and are willing to accept the patient.  05/09/2013  Patient/family informed of [facility]' ownership interest in [facility], as well as of the fact that they are under no obligation to receive care at this facility.  PASARR submitted to EDS on 05/09/2013 PASARR number received from EDS on 05/09/2013  FL2 transmitted to all facilities in geographic area requested by pt/family on  05/09/2013 FL2 transmitted to all facilities within larger geographic area on   Patient informed that his/her managed care company has contracts with or will negotiate with  certain facilities, including the following:     Patient/family informed of bed offers received:   Patient chooses bed at  Physician recommends and patient chooses bed at    Patient to be transferred to  on   Patient to be transferred to facility by   The following physician request were entered in Epic:   Additional Comments:

## 2013-05-09 NOTE — Discharge Summary (Signed)
Patient Name:  Gina [name]  MRN: [id number]  PCP: Jenetta Downer, MD  DOB:  September 20, 1929       Date of Admission:  05/06/2013  Date of Discharge:  05/09/2013      Attending Physician: Dr. Carlyle Basques, MD         DISCHARGE DIAGNOSES: Principal Problem:   Syncope Active Problems:   PULMONARY NODULE   PVD (peripheral vascular disease)   Tobacco abuse   Dizziness   Thyroid nodule   Hypertension   Hyperlipidemia   Acute kidney injury   Protein-calorie malnutrition, severe    DISPOSITION AND FOLLOW-UP: Gina [name] is to follow-up with the listed providers as detailed below, at patient's visiting in clinic, please address following issues:  1. She needs to have work up for Multinodular goiter as outpatient. Her TSH <0.008 and Free T 3.60 2. Make sure that her abdominal pain has improved.  3. Check her blood pressure and make adjustment for her medications.    Follow-up Information   Follow up with Beryle Beams, MD On 05/24/2013. (9:30am on Monday 8/25)    Contact information:   [address] [place] [address] [phone]       Follow up with Jenetta Downer, MD On 05/11/2013. (Tuesday August 12th at 1:30 pm.)    Contact information:   [place] [address] [phone]       Follow up with Philemon Kingdom, MD On 05/14/2013. (Appt at 3:15, Friday 05/14/13)    Contact information:   [address] [place] [address] [phone]          Discharge Orders   Future Appointments Provider Department Dept Phone   05/14/2013 3:15 PM Philemon Kingdom, MD [place] [phone]   02/15/2014 12:30 PM [facility] [phone]   02/15/2014 1:30 PM [facility] - [phone]   02/15/2014 2:00 PM Mal Misty, MD [facility] [phone]   Future Orders Complete By Expires     Increase activity slowly  As directed         DISCHARGE MEDICATIONS:   Medication List    STOP taking these medications       omeprazole 20 MG tablet  Commonly known as:  PRILOSEC OTC     TRIBENZOR 20-5-12.5 MG Tabs  Generic drug:  Olmesartan-Amlodipine-HCTZ      TAKE these medications       amLODipine 10 MG tablet  Commonly known as:  NORVASC  Take 1 tablet (10 mg total) by mouth daily.     atorvastatin 40 MG tablet  Commonly known as:  LIPITOR  Take 40 mg by mouth daily.     dorzolamide-timolol 22.3-6.8 MG/ML ophthalmic solution  Commonly known as:  COSOPT  Place 1 drop into both eyes daily.     DSS 100 MG Caps  Take 100 mg by mouth daily.     gentamicin 0.3 % ophthalmic solution  Commonly known as:  GARAMYCIN  Place 1 drop into the right eye 4 (four) times daily.     HYDROcodone-acetaminophen 5-325 MG per tablet  Commonly known as:  NORCO/VICODIN  Take 1 tablet by mouth every 8 (eight) hours as needed for pain.     lisinopril 40 MG tablet  Commonly known as:  PRINIVIL,ZESTRIL  Take 40 mg by mouth daily.     metoprolol  tartrate 25 MG tablet  Commonly known as:  LOPRESSOR  Take 1 tablet (25 mg total) by mouth 2 (two) times daily.     pantoprazole 40 MG tablet  Commonly known as:  PROTONIX  Take 1 tablet (40 mg total) by mouth 2 (two) times daily before a meal.     sucralfate 1 G tablet  Commonly known as:  CARAFATE  Take 1 tablet (1 g total) by mouth 4 (four) times daily -  with meals and at bedtime.         CONSULTS: GI Treatment Team:  Beryle Beams, MD    PROCEDURES PERFORMED:  X-ray Chest Pa And Lateral   05/07/2013   *RADIOLOGY REPORT*  Clinical Data: Syncope.  CHEST - 2 VIEW  Comparison: CT chest, abdomen and pelvis 04/04/2013 and chest and two views abdomen 04/09/2013.  Findings: The chest is hyperexpanded compatible with emphysema.  A single punctate calcified granuloma is  seen in the left lower lobe. The lungs are otherwise clear.  Heart size is normal.  There is no pneumothorax or pleural fluid.  IMPRESSION: Emphysema without acute disease.   Original Report Authenticated By: Orlean Patten, M.D.   Dg Lumbar Spine 2-3 Views  05/06/2013   *RADIOLOGY REPORT*  Clinical Data: Lower back pain post fall  LUMBAR SPINE - 2-3 VIEW  Comparison: 02/20/2009  Findings: Five non-rib bearing lumbar vertebrae. Osseous demineralization. Facet degenerative changes mid to lower lumbar spine. Subtle superior endplate compression deformity of L1 vertebral body new since previous exam, with approximately 10-15% superior endplate height loss. Remaining vertebrae normal in height and alignment. No additional fracture, subluxation or bone destruction. Scattered atherosclerotic calcifications aorta and iliac arteries. Mild chronic asymmetric widening of the inferior right SI joint with degenerative changes noted on a prior CT.  IMPRESSION: Osseous demineralization. Mild superior endplate compression fracture of L1 with 10-15% anterior height loss. Facet degenerative changes mid-to-lower lumbar spine.   Original Report Authenticated By: Lavonia Dana, M.D.   Ct Head Wo Contrast  05/06/2013   *RADIOLOGY REPORT*  Clinical Data: Fall with head injury.  Posterior laceration.  CT HEAD WITHOUT CONTRAST  Technique:  Contiguous axial images were obtained from the base of the skull through the vertex without contrast.  Comparison: Cervical spine CT 02/24/2009,  Findings: There is no evidence of acute intracranial hemorrhage, mass lesion, brain edema or extra-axial fluid collection.  The ventricles and subarachnoid spaces are appropriately sized for age. There is no CT evidence of acute cortical infarction.  There is minimal periventricular white matter disease for age, likely chronic small vessel ischemic change.  Intracranial vascular calcifications are noted.  The visualized paranasal sinuses, mastoid air cells  and middle ears are clear. The calvarium is intact. There is an irregularity of the right nasal bone which does not appear acute.  IMPRESSION:  1.  No acute intracranial or calvarial findings. 2.  Irregularity of the right nasal bone suspected to reflect an old injury.  Correlate clinically.   Original Report Authenticated By: Richardean Sale, M.D.   Ct Cervical Spine Wo Contrast  05/06/2013   *RADIOLOGY REPORT*  Clinical Data: Fall.  Neck pain  CT CERVICAL SPINE WITHOUT CONTRAST  Technique:  Multidetector CT imaging of the cervical spine was performed. Multiplanar CT image reconstructions were also generated.  Comparison: None  Findings: Normal cervical alignment.  Negative for fracture or mass.  Disc degeneration and spondylosis in the cervical spine. There is facet degeneration most prominent at C4-5.  Mild anterior slip  of C4-5.  There is prominent spondylosis on the right at C3-4, C5-6, and C6-7 causing foraminal encroachment  IMPRESSION: Cervical spondylosis.  Negative for fracture.   Original Report Authenticated By: Carl Best, M.D.   Ct Cta Abd/pel W/cm &/or W/o Cm  04/09/2013   *RADIOLOGY REPORT*  Clinical Data:  Diffuse lower abdominal pain.  Nausea, vomiting, and diarrhea.  Symptoms of 03/04/2013.  CT ANGIOGRAPHY ABDOMEN AND PELVIS  Technique:  Multidetector CT imaging of the abdomen and pelvis was performed using the standard protocol during bolus administration of intravenous contrast.  Multiplanar reconstructed images including MIPs were obtained and reviewed to evaluate the vascular anatomy.  Contrast: 189mL OMNIPAQUE IOHEXOL 350 MG/ML SOLN  Comparison:  CT chest abdomen and pelvis 04/04/2013.  Findings:  Emphysematous changes and mild dependent atelectasis in the lung bases.  Normal caliber abdominal aorta without aneurysm.  No dissection. There is extensive degenerative change throughout the abdominal aorta and major abdominal branch vessels with extensive vascular calcification present.   Severe calcification in the origins of the superior mesenteric artery and in the renal arteries bilaterally likely produce stenosis however flow is demonstrated distally in the vessels appear patent.  The celiac axis, inferior mesenteric artery, and bilateral common iliac, external iliac, and common femoral arteries appear patent.  Renal nephrograms are symmetrical.  The solid organs, stomach, small bowel, and colon appear unremarkable for given technique.  The inferior vena cava is normal.  No free air or free fluid in the abdomen.  Tiny fat containing umbilical hernia.  Pelvis:  Uterus is surgically absent.  Bladder wall is not thickened.  No evidence of diverticulitis.  The appendix is not identified.  No significant lymphadenopathy in the retroperitoneum, mesentery, or pelvis.  No free or loculated pelvic fluid collections.  Degenerative changes throughout the lumbar spine.  No destructive bone lesions.   Review of the MIP images confirms the above findings.  IMPRESSION: No evidence of aortic aneurysm or dissection.  Extensive calcific atherosclerotic changes throughout the abdominal aorta and branch vessels.  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   Original Report Authenticated By: Lucienne Capers, M.D.       ADMISSION DATA:  H&P: Patient is 79 y.o. woman with a history of DM-II (A1c 6.2 on 01/28/13), HTN, partial right mastectomy for intraductal ca, hyperlipidemia, peripheral vascular disease, who presents with syncope.  Patient reports that she has bee having diarrhea with loose stool in the past 3 to 4 day. Her oral intake is decreased and has been feeling weak. In this morning when she tried to answer her home aid in hurry, she passed out. The patient does not remember the episode, but woke on the floor a short time later. She did not loss control of bowel or bladder function. She did not bite her tongue. Then, she started  having pain in the back of her head and noticed tiny amount of bleeding from the back of her head. She denies chest pain, but has had palpitations which were worse in the past few days. Denies any difficulty breathing. Of note, patient reports having difficult in swallowing, which happens to both solid and liquid food. She also has weight loss of more than 30 LBs. She was scheduled for endoscopic evaluation by GI, Dr. Benson Norway today.  Denies fever, chills, cough, chest pain, SOB,  dysuria, urgency, frequency, hematuria.  Physical Exam:     Filed Vitals:     05/06/13 1008  05/06/13 1049  05/06/13 1130  05/06/13 1230   BP:  139/54  150/42  167/45  161/46   Pulse:  99  72  77  88   Temp:           TempSrc:           Resp:    14  15  18    SpO2:    100%  98%  100%     General: Not in acute distress HEENT:  EOMI, no scleral icterus,  laceration to occiptal head, proximately 2x2 cm Cardiac: S1/S2, RRR, No murmurs, gallops or rubs Pulm: Good air movement bilaterally. Clear to auscultation bilaterally. No rales, wheezing, rhonchi or rubs. Abd: Soft, nondistended, BS present, tenderness to palpation in suprapubic region. No guarding or organomegaly. Ext: No edema Musculoskeletal: No joint deformities, erythema, or stiffness, ROM full Skin: No rashes.   Neuro: Alert and oriented X3, cranial nerves II-XII grossly intact, muscle strength 4/5 in all extremeties, sensation to light touch intact. Brachial reflex 2+ bilaterally. Knee reflex 2+ bilaterally.  Normal finger to nose test.  Normal rapid alternating hand movements Psych: Patient is not psychotic.  Lab results: Basic Metabolic Panel:  Recent Labs   05/06/13 0945   NA  141   K  4.8   CL  98   CO2  23   GLUCOSE  110*   BUN  60*   CREATININE  1.72*   CALCIUM  10.7*    Liver Function Tests: No results found for this basename: AST, ALT, ALKPHOS, BILITOT, PROT, ALBUMIN,  in the last 72 hours No results found for this basename: LIPASE,  AMYLASE,  in the last 72 hours No results found for this basename: AMMONIA,  in the last 72 hours CBC:  Recent Labs   05/06/13 0945   WBC  6.7   NEUTROABS  5.2   HGB  11.7*   HCT  33.0*   MCV  87.8   PLT  188    Cardiac Enzymes:  Recent Labs   05/06/13 0945   TROPONINI  <0.30       HOSPITAL COURSE:  1. Syncope - It was most likely due to dehydration. Patient presented in dehydrated state with poor po intake.  Orthostatic hypotension is likely, however on admission the patient did not feel well enough to stand for orthostatic vitals.  Symptoms have resolved after IV rehydration, and no further episodes of syncope occurred.  2. Toxic Multinodular Goiter - patient has history of depressed TSH with normal range free T4, and is currently clinically euthyroid with the exception of some recent weight loss, however TSH performed 8/8 shows TSH of 0.008 and Free T4 elevated to 3.60.  Patient is now hyperthyroid with a history of US showing multinodular goiter including a single nodule of 1.2 cm in diameter.  Follow up was arranged with Dr. Cruzita Lederer (endocrine).   3. Abdominal pain - EGD was performed by GI, Dr. Ronalee Red on 8/8. She was found to have multiple gastric AVM.  AVM were repaired with APC and ablation. Stomach biopsy: REACTIVE GASTROPATHY. NEGATIVE FOR HELICOBACTER PYLORI. NO INTESTINAL METAPLASIA, DYSPLASIA, OR MALIGNANCY. Esophagus biopsy: NO EVIDENCE OF SIGNIFICANT INFLAMMATION, INTESTINAL METAPLASIA, DYSPLASIA OR MALIGNANCY IDENTIFIED. She will continue Pantoprazole and sucralfate at discharge.  f/u scheduled with Dr. Benson Norway for 9:30 Monday 05/24/13.   4. HTN: Patient was on lisinopril and Tribenzor at home. We continued her lisinopril, but switched Tribenzor to amlodipine and added metoprolol to her regimen. She will  continue this regimen at discharge.   5. New Compression Fracture of L1 - previously undocumented L1 compression fracture was noted on imaging.  Patient is also  complaining of new onset back pain since her fall.   Patient was scheduled for follow up with her PCP.       DISCHARGE DATA: Vital Signs: BP 158/57  Pulse 66  Temp(Src) 98.7 F (37.1 C) (Oral)  Resp 18  Ht 5\' 7"  (1.702 m)  Wt 108 lb 11.2 oz (49.306 kg)  BMI 17.02 kg/m2  SpO2 97%  Labs: No results found for this or any previous visit (from the past 24 hour(s)).   Services Ordered on Discharge: Y = Yes; Blank = No PT: PT  OT:   RN:   Equipment: Rolling walker with 5" wheels    Other:      Time Spent on Discharge: 35 min   Signed: Ivor Costa, MD PGY 2, Internal Medicine Resident 05/11/2013, 1:02 PM

## 2013-05-09 NOTE — Progress Notes (Signed)
Clinical Social Work Department BRIEF PSYCHOSOCIAL ASSESSMENT 05/09/2013  Patient:  Gina [name], Gina [name]     Account Number:  [id number]     Admit date:  05/06/2013  Clinical Social Worker:  Venia Minks  Date/Time:  05/09/2013 12:00 M  Referred by:  Physician  Date Referred:  05/09/2013 Referred for  SNF Placement   Other Referral:   Interview type:  Patient Other interview type:   granddaughter at bedside.    PSYCHOSOCIAL DATA Living Status:  ALONE Admitted from facility:   Level of care:   Primary support name:  Tonya Penn Primary support relationship to patient:  FAMILY Degree of support available:   good    CURRENT CONCERNS Current Concerns  Post-Acute Placement   Other Concerns:    SOCIAL WORK ASSESSMENT / PLAN CSW met with patient and granddaughter at bedside. patient is alert and oriented X3. patient in need of snf placement. patient very pleasant and is requesting [facility] upon discharge. patient is agreeable to need for snf and feels comfortable with this choice.   Assessment/plan status:   Other assessment/ plan:   Information/referral to community resources:    PATIENT'S/FAMILY'S RESPONSE TO PLAN OF CARE: patient agreeable to [facility] and is comfortable with her need for snf. she is hopeful after short term rehab she will be able to return home.

## 2013-05-09 NOTE — Progress Notes (Addendum)
Pt in bed o4x no complaints of pain or SOB, Pt able to tolerate meal today. Will continue to monitor Pt

## 2013-05-09 NOTE — Progress Notes (Signed)
Subjective: - Patient feels better today.  - Tolerated EGD procedure. There are a bleeding proximal gastric AVM s/p APC and ablation.  - Patient has did not have BW since 8/5. Started colace, but still did have BM.   Objective: Vital signs in last 24 hours: Filed Vitals:   05/08/13 2042 05/09/13 0551 05/09/13 0900 05/09/13 1300  BP: 145/42 140/43 146/41 131/43  Pulse: 67 61 64 64  Temp: 98.9 F (37.2 C) 99 F (37.2 C) 98.2 F (36.8 C) 98.7 F (37.1 C)  TempSrc: Oral Oral Oral Oral  Resp: 18 18 20 20   Height:      Weight:  107 lb 9.6 oz (48.807 kg)    SpO2: 100% 92% 99% 99%   Weight change: 4 lb 8 oz (2.041 kg)  Intake/Output Summary (Last 24 hours) at 05/09/13 1437 Last data filed at 05/09/13 1300  Gross per 24 hour  Intake 2932.5 ml  Output   1401 ml  Net 1531.5 ml    General: Not in acute distress.  HEENT: EOMI, no scleral icterus, laceration on posterior scalp Cardiac: S1/S2, RRR, No murmurs, gallops or rubs   Pulm: Good air movement bilaterally. Clear to auscultation bilaterally. No rales, wheezing, rhonchi or rubs.   Abd: Soft, nondistended, BS present, there is mild tenderness over RUQ Ext: No edema   Musculoskeletal: No joint deformities, erythema, or stiffness Skin: No rashes.   Neuro: Alert and oriented X3  Lab Results: Basic Metabolic Panel:  Recent Labs Lab 05/07/13 0510 05/08/13 0630  NA 143 141  K 4.1 3.4*  CL 105 108  CO2 26 24  GLUCOSE 85 136*  BUN 53* 31*  CREATININE 0.99 0.71  CALCIUM 9.3 9.2  MG 2.1 1.7  PHOS  --  2.9   Liver Function Tests:  Recent Labs Lab 05/07/13 0510  AST 16  ALT 13  ALKPHOS 53  BILITOT 0.7  PROT 5.9*  ALBUMIN 2.9*   No results found for this basename: LIPASE, AMYLASE,  in the last 168 hours No results found for this basename: AMMONIA,  in the last 168 hours CBC:  Recent Labs Lab 05/06/13 0945 05/07/13 0510 05/08/13 0630  WBC 6.7 4.9 4.7  NEUTROABS 5.2  --   --   HGB 11.7* 9.7* 9.4*  HCT 33.0*  27.6* 27.6*  MCV 87.8 86.8 87.9  PLT 188 192 180   Cardiac Enzymes:  Recent Labs Lab 05/06/13 0945 05/06/13 2300  TROPONINI <0.30 <0.30   BNP: No results found for this basename: PROBNP,  in the last 168 hours D-Dimer: No results found for this basename: DDIMER,  in the last 168 hours CBG: No results found for this basename: GLUCAP,  in the last 168 hours Hemoglobin A1C: No results found for this basename: HGBA1C,  in the last 168 hours Fasting Lipid Panel: No results found for this basename: CHOL, HDL, LDLCALC, TRIG, CHOLHDL, LDLDIRECT,  in the last 168 hours Thyroid Function Tests:  Recent Labs Lab 05/06/13 1928  TSH <0.008*  FREET4 3.60*   Coagulation:  Recent Labs Lab 05/07/13 0510  LABPROT 14.3  INR 1.13   Anemia Panel: No results found for this basename: VITAMINB12, FOLATE, FERRITIN, TIBC, IRON, RETICCTPCT,  in the last 168 hours Urine Drug Screen: Drugs of Abuse  No results found for this basename: labopia,  cocainscrnur,  labbenz,  amphetmu,  thcu,  labbarb    Alcohol Level: No results found for this basename: ETH,  in the last 168 hours Urinalysis:  Recent  Labs Lab 05/06/13 1453  COLORURINE YELLOW  LABSPEC 1.015  PHURINE 5.0  GLUCOSEU NEGATIVE  HGBUR NEGATIVE  BILIRUBINUR MODERATE*  KETONESUR 15*  PROTEINUR 30*  UROBILINOGEN 1.0  NITRITE NEGATIVE  LEUKOCYTESUR SMALL*   Misc. Labs:  Micro Results: Recent Results (from the past 240 hour(s))  URINE CULTURE     Status: None   Collection Time    05/06/13  2:53 PM      Result Value Range Status   Specimen Description URINE, CLEAN CATCH   Final   Special Requests NONE   Final   Culture  Setup Time     Final   Value: 05/06/2013 16:26     Performed at [facility]     Final   Value: >=100,000 COLONIES/ML     Performed at [facility]   Culture     Final   Value: Multiple bacterial morphotypes present, none predominant. Suggest appropriate recollection if  clinically indicated.     Performed at [facility]   Report Status 05/07/2013 FINAL   Final   Studies/Results: No results found. Medications:  Scheduled Meds: . amLODipine  10 mg Oral Daily  . atorvastatin  40 mg Oral QHS  . docusate sodium  100 mg Oral Daily  . dorzolamide-timolol  1 drop Both Eyes Daily  . feeding supplement  1 Container Oral TID BM  . heparin  5,000 Units Subcutaneous Q8H  . metoprolol tartrate  25 mg Oral BID  . pantoprazole  40 mg Oral BID AC  . sodium chloride  3 mL Intravenous Q12H  . sucralfate  1 g Oral TID WC & HS  . TDaP  0.5 mL Intramuscular Once  . tobramycin  1 drop Right Eye QID   Continuous Infusions: . sodium chloride 75 mL/hr at 05/09/13 0954   PRN Meds:.HYDROcodone-acetaminophen Assessment/Plan:  #Syncope : resolved. Likely due to dehydration. Another possibility is cardiac arrythmia. She has abnormal EKG with frequent PAC on 04/09/13. She also reports having palpitation recently. Other DD include, but less likely, TIA or stroke (no focal neurological findings on exam and no extremety weakness or sensation change); ACS (EKG has TWI, but no significant change compare to previous EKG 04/09/13); PE (Well's score is low probability), hypoglycemia (not on hypoglycemic medications a home, her CBG=110 on admission) and vasovagal syncope (she did not have any premonitory symptoms before). Condition improved overnight with IVF rehydration.   -PT/OT eval -decrease NS from 100cc/hr to 75 cc/h  -Tele  -Fall precautions   #: HTN: bp 140/43 mmHg. Continue Amlodipine 10mg  and metoprolol 12.5   #Abdominal Pain - likely due to gastric AVMs and gastric ulcers as evidenced by EGD.   -Appreciate GI's consult in managing our pt. -will follow up recs: Pantoprazole BID , Sucralfate TID,  - Await biopsy results. - - f/u scheduled with Dr. Benson Norway for 9:30 Monday 05/24/13   #Multinodular Goiter - [place] positive for multinodular goiter with discreet 1.2cm nodule,  TSH and T4 show hyperthyroidism today, however patient is asymptomatic.  -f/u appt made for Tuesday August 12th at 1:30 with PCP (Dr. Denton Brick) for hospital f/u and hyperthyroidism   # Occular issues - taking gentamicin drops for her right eye for "glaucoma" per the patient. Patient sees outside ophthamology, will continue home gentamicin.   # Constipation: on colace without improvement. Will add MiraLax today.  # DVT px:  -Heparin 5000 u TID  #: Deposition: plan SNF. SW consulted for looking for Bed.  Likely dc on 8/11 or 8/12 when bed is available.    LOS: 3 days   Ivor Costa 05/09/2013, 2:37 PM

## 2013-05-10 ENCOUNTER — Encounter (HOSPITAL_COMMUNITY): Payer: Self-pay | Admitting: Gastroenterology

## 2013-05-10 DIAGNOSIS — E041 Nontoxic single thyroid nodule: Secondary | ICD-10-CM

## 2013-05-10 DIAGNOSIS — M549 Dorsalgia, unspecified: Secondary | ICD-10-CM

## 2013-05-10 DIAGNOSIS — Q2733 Arteriovenous malformation of digestive system vessel: Secondary | ICD-10-CM

## 2013-05-10 LAB — CBC WITH DIFFERENTIAL/PLATELET
Basophils Absolute: 0 10*3/uL (ref 0.0–0.1)
Basophils Relative: 1 % (ref 0–1)
Eosinophils Absolute: 0.2 10*3/uL (ref 0.0–0.7)
Eosinophils Relative: 4 % (ref 0–5)
HCT: 27.1 % — ABNORMAL LOW (ref 36.0–46.0)
Hemoglobin: 9.5 g/dL — ABNORMAL LOW (ref 12.0–15.0)
Lymphocytes Relative: 31 % (ref 12–46)
Lymphs Abs: 1.4 10*3/uL (ref 0.7–4.0)
MCH: 30.1 pg (ref 26.0–34.0)
MCHC: 35.1 g/dL (ref 30.0–36.0)
MCV: 85.8 fL (ref 78.0–100.0)
Monocytes Absolute: 0.3 10*3/uL (ref 0.1–1.0)
Monocytes Relative: 7 % (ref 3–12)
Neutro Abs: 2.5 10*3/uL (ref 1.7–7.7)
Neutrophils Relative %: 57 % (ref 43–77)
Platelets: 211 10*3/uL (ref 150–400)
RBC: 3.16 MIL/uL — ABNORMAL LOW (ref 3.87–5.11)
RDW: 13.2 % (ref 11.5–15.5)
WBC: 4.5 10*3/uL (ref 4.0–10.5)

## 2013-05-10 LAB — COMPREHENSIVE METABOLIC PANEL
ALT: 15 U/L (ref 0–35)
AST: 15 U/L (ref 0–37)
Albumin: 2.6 g/dL — ABNORMAL LOW (ref 3.5–5.2)
Alkaline Phosphatase: 68 U/L (ref 39–117)
BUN: 9 mg/dL (ref 6–23)
CO2: 23 mEq/L (ref 19–32)
Calcium: 9.1 mg/dL (ref 8.4–10.5)
Chloride: 110 mEq/L (ref 96–112)
Creatinine, Ser: 0.54 mg/dL (ref 0.50–1.10)
GFR calc Af Amer: >90 mL/min (ref >90)
GFR calc non Af Amer: 86 mL/min — ABNORMAL LOW (ref >90)
Glucose, Bld: 171 mg/dL — ABNORMAL HIGH (ref 70–99)
Potassium: 3.6 mEq/L (ref 3.5–5.1)
Sodium: 142 mEq/L (ref 135–145)
Total Bilirubin: 0.5 mg/dL (ref 0.3–1.2)
Total Protein: 5.7 g/dL — ABNORMAL LOW (ref 6.0–8.3)

## 2013-05-10 NOTE — Progress Notes (Signed)
  Date: 05/10/2013  Patient name: Gina [name]  Medical record number: [id number]  Date of birth: Feb 24, 1929   This patient has been seen and the plan of care was discussed with the house staff. Please see their note for complete details. I concur with their findings with the following additions/corrections: Agree with plan as outlined by Dr. Blaine Hamper and medical student, Wynelle Beckmann. Patient is doing better from her admission. No further episodes of syncope and improvement with abdominal pain. Will have her follow up with Dr. Benson Norway as well as Dr.Gherghe for GI and endocrine, respectively  Carlyle Basques, MD 05/10/2013, 5:21 PM

## 2013-05-10 NOTE — Progress Notes (Signed)
NUTRITION FOLLOW UP  DOCUMENTATION CODES  Per approved criteria   -Severe malnutrition in the context of chronic illness  -Underweight    Intervention:   Continue Resource Breeze po TID, each supplement provides 250 kcal and 9 grams of protein.  RD to continue to follow nutrition care plan.  Nutrition Dx:   Malnutrition related to poor appetite as evidenced by 35% wt loss x 1 year, severe muscle and fat mass loss. Ongoing.  Goal:   Intake to meet >90% of estimated nutrition needs. Met.  Monitor:   weight trends, lab trends, I/O's, PO intake, supplement tolerance   Assessment:   PMHx significant for DM, HTN, partial R mastectomy for intraductal CA, HLD, PVD. Admitted with syncope. Other c/o include diarrhea x 3-4 days, poor oral intake, difficulty swallowing and weight loss.   Underwent EGD 8/8. Findings revealed bleeding AVMs and gastric ulcers. Biopsies were taken. Pt also dx with toxic multinodular goiter.   Continues on a Regular diet. Now eating very well, consuming >75% of meals. Pt states that she enjoys the Breeze supplements and would like to continue them when d/c. She states that they are bringing her appetite back. She ate very well for the first time "in a long time" today.  Height: Ht Readings from Last 1 Encounters:  05/06/13 5\' 7"  (1.702 m)    Weight Status:   Wt Readings from Last 1 Encounters:  05/10/13 109 lb (49.442 kg)  Wt up 12 lb x 3 days likely 2/2 positive fluid balance of +5.4 liters.  Re-estimated needs:  Kcal: 1300 - 1500 Protein: 50 - 60 g  Fluid: at least 1.4 g daily  Skin: intact  Diet Order: General   Intake/Output Summary (Last 24 hours) at 05/10/13 1345 Last data filed at 05/10/13 1304  Gross per 24 hour  Intake   3250 ml  Output   2373 ml  Net    877 ml    Last BM: 8/10   Labs:   Recent Labs Lab 05/07/13 0510 05/08/13 0630 05/10/13 0900  NA 143 141 142  K 4.1 3.4* 3.6  CL 105 108 110  CO2 26 24 23   BUN 53* 31* 9   CREATININE 0.99 0.71 0.54  CALCIUM 9.3 9.2 9.1  MG 2.1 1.7  --   PHOS  --  2.9  --   GLUCOSE 85 136* 171*    CBG (last 3)  No results found for this basename: GLUCAP,  in the last 72 hours  Scheduled Meds: . amLODipine  10 mg Oral Daily  . atorvastatin  40 mg Oral QHS  . docusate sodium  100 mg Oral Daily  . dorzolamide-timolol  1 drop Both Eyes Daily  . feeding supplement  1 Container Oral TID BM  . heparin  5,000 Units Subcutaneous Q8H  . metoprolol tartrate  25 mg Oral BID  . pantoprazole  40 mg Oral BID AC  . sodium chloride  3 mL Intravenous Q12H  . sucralfate  1 g Oral TID WC & HS  . TDaP  0.5 mL Intramuscular Once  . tobramycin  1 drop Right Eye QID    Continuous Infusions: . sodium chloride 75 mL/hr at 05/10/13 [address] [place], [place], [place]N Pager: [phone] After-hours pager: [phone]

## 2013-05-10 NOTE — Evaluation (Signed)
Occupational Therapy Evaluation Patient Details Name: Gina [name] MRN: [id number] DOB: 05/14/1929 Today's Date: 05/10/2013 Time: [id number] OT Time Calculation (min): 14 min  OT Assessment / Plan / Recommendation History of present illness 79 year old female who was schedule to have an EGD, but she experienced a syncopal episode this morning.  She got up from her bed and walked to the door to let her aid into her house and then she collapsed.  There was a brief loss of consciousness.  The work up in the ER was negative for any cerebral trauma or any fractures.  It was felt that she was dehydrated.  She will be admitted to the hospital for further evaluation.   Clinical Impression   Pt overall min guard assist functional mobility and transfers related to ADL's. She should benefit from RW use and SNF rehab prior to return home as she lives alone. Generalized weakness/fatigues easily.    OT Assessment  Patient needs continued OT Services    Follow Up Recommendations  SNF    Barriers to Discharge Other (comment) (lives alone)    Equipment Recommendations  None recommended by OT    Recommendations for Other Services    Frequency  Min 2X/week    Precautions / Restrictions Precautions Precautions: Fall Restrictions Weight Bearing Restrictions: No   Pertinent Vitals/Pain Pt c/o low back pain s/p fall at home that resolved with repositioning and functional mobility/transfers.    ADL  Eating/Feeding: Performed;Set up Where Assessed - Eating/Feeding: Edge of bed Grooming: Performed;Wash/dry hands;Wash/dry face;Minimal assistance Where Assessed - Grooming: Supported standing Upper Body Bathing: Simulated;Minimal assistance Where Assessed - Upper Body Bathing: Unsupported sitting Lower Body Bathing: Simulated;Minimal assistance Where Assessed - Lower Body Bathing: Supported sit to stand Upper Body Dressing: Performed;Set up Where Assessed - Upper Body Dressing: Unsupported  sitting Lower Body Dressing: Performed;Supervision/safety (Donned socks sitting EOB) Where Assessed - Lower Body Dressing: Unsupported sitting;Supported sit to stand Toilet Transfer: Simulated;Minimal assistance Toilet Transfer Method: Sit to Loss adjuster, chartered: Comfort height toilet;Grab bars Toileting - Clothing Manipulation and Hygiene: Simulated;Min guard Where Assessed - Best boy and Hygiene: Standing Tub/Shower Transfer Method: Not assessed Equipment Used: Gait belt;Other (comment) (Hand held assist) Transfers/Ambulation Related to ADLs: Pt overall min assist to min guard assist functional mobility and transfers related to ADL's. Fatigues easily, narrow base of stance, may benefit from RW use vs hand held assist. ADL Comments: Pt overall min guard assist functional mobility and transfers related to ADL's. She should benefit from RW use and SNF rehab prior to return home as she lives alone. Generalized weakness/fatigues easily.    OT Diagnosis: Generalized weakness;Other (comment) (Low back pain from fall at home per pt)  OT Problem List: Decreased activity tolerance;Decreased knowledge of use of DME or AE;Impaired balance (sitting and/or standing);Pain OT Treatment Interventions: Self-care/ADL training;Energy conservation;DME and/or AE instruction;Patient/family education;Therapeutic activities   OT Goals(Current goals can be found in the care plan section) Acute Rehab OT Goals Patient Stated Goal: To get stronger and figure out whats going on OT Goal Formulation: With patient Time For Goal Achievement: 05/17/13 Potential to Achieve Goals: Good ADL Goals Pt Will Perform Grooming: with modified independence;standing Pt Will Perform Lower Body Dressing: with modified independence;sit to/from stand;with adaptive equipment Pt Will Transfer to Toilet: with modified independence;ambulating;grab bars;bedside commode Pt Will Perform Toileting - Clothing  Manipulation and hygiene: with modified independence;sit to/from stand Pt Will Perform Tub/Shower Transfer: Tub transfer;with min assist;ambulating;shower seat;grab bars;rolling walker  Visit Information  Last OT Received On: 05/10/13 Assistance Needed: +1 PT/OT Co-Evaluation/Treatment: Yes History of Present Illness: 79 year old female who was schedule to have an EGD, but she experienced a syncopal episode this morning.  She got up from her bed and walked to the door to let her aid into her house and then she collapsed.  There was a brief loss of consciousness.  The work up in the ER was negative for any cerebral trauma or any fractures.  It was felt that she was dehydrated.  She will be admitted to the hospital for further evaluation.       Prior [place] expects to be discharged to:: Private residence Living Arrangements: Alone Available Help at Discharge: Other (Comment) (HHA 2-3 hours/day 5 days/week) Type of Home: Apartment Home Access: Level entry Home Layout: One level Home Equipment: Cane - single point;Shower seat;Walker - standard Prior Function Level of Independence: Needs assistance Gait / Transfers Assistance Needed: ambulates without AD in home; community takes cane ADL's / Homemaking Assistance Needed: [place] aide assist with cooking and cleaning Communication Communication: No difficulties Dominant Hand: Right    Vision/Perception Vision - History Baseline Vision: Other (comment) (Glasses PRN) Visual History: Macular degeneration Patient Visual Report: No change from baseline   Cognition  Cognition Arousal/Alertness: Awake/alert Behavior During Therapy: WFL for tasks assessed/performed Overall Cognitive Status: Within Functional Limits for tasks assessed    Extremity/Trunk Assessment Upper Extremity Assessment Upper Extremity Assessment: Overall WFL for tasks assessed Lower Extremity Assessment Lower Extremity Assessment:  Generalized weakness    Mobility Bed Mobility Bed Mobility: Not assessed Transfers Transfers: Sit to Stand;Stand to Sit Sit to Stand: 4: Min guard;With upper extremity assist;From bed Stand to Sit: 4: Min guard;With upper extremity assist;With armrests;To chair/3-in-1 Details for Transfer Assistance: VC's for hand placement during transfers. Pt moves slowly during transfers, also c/o low back pain that pt reports resolved w/ functional mobility/ambulation in room/hallway.      Balance Balance Balance Assessed: Yes Static Sitting Balance Static Sitting - Balance Support: Feet supported Static Sitting - Level of Assistance: 6: Modified independent (Device/Increase time) Static Standing Balance Static Standing - Balance Support: Left upper extremity supported;During functional activity   End of Session OT - End of Session Equipment Utilized During Treatment: Gait belt Activity Tolerance: Patient tolerated treatment well Patient left: in chair;with call bell/phone within reach  GO     Josephine Igo Dixon 05/10/2013, 9:03 AM

## 2013-05-10 NOTE — Progress Notes (Signed)
Subjective: - Patient refuses labs this am - Patient feels better today.  - Tolerated EGD procedure which showed bleeding proximal gastric AVMs s/p APC and ablation.  - Patient had two bowel movements yesterday. -PO intake is improving with the patient reporting eating a chicken leg, half a sausage, cobbler and some fruit yesterday.  Objective: Vital signs in last 24 hours: Filed Vitals:   05/09/13 1300 05/09/13 2046 05/10/13 0407 05/10/13 0517  BP: 131/43 149/65  152/48  Pulse: 64 58  68  Temp: 98.7 F (37.1 C) 98.4 F (36.9 C)  98 F (36.7 C)  TempSrc: Oral Oral  Oral  Resp: 20 18  17   Height:      Weight:   49.442 kg (109 lb)   SpO2: 99% 98%  94%   Weight change: 0.635 kg (1 lb 6.4 oz)  Intake/Output Summary (Last 24 hours) at 05/10/13 0806 Last data filed at 05/10/13 0732  Gross per 24 hour  Intake 2217.5 ml  Output   2502 ml  Net -284.5 ml    General: Not in acute distress.  HEENT: EOMI, no scleral icterus, laceration on posterior scalp Cardiac: S1/S2, RRR, No murmurs, gallops or rubs   Pulm: Good air movement bilaterally. Clear to auscultation bilaterally. No rales, wheezing, rhonchi or rubs.   Abd: Soft, nondistended, BS present, there is mild tenderness over RUQ Ext: No edema   Musculoskeletal: No joint deformities, erythema, or stiffness Skin: No rashes.   Neuro: Alert and oriented X3  Lab Results: Basic Metabolic Panel:  Recent Labs Lab 05/07/13 0510 05/08/13 0630  NA 143 141  K 4.1 3.4*  CL 105 108  CO2 26 24  GLUCOSE 85 136*  BUN 53* 31*  CREATININE 0.99 0.71  CALCIUM 9.3 9.2  MG 2.1 1.7  PHOS  --  2.9   Liver Function Tests:  Recent Labs Lab 05/07/13 0510  AST 16  ALT 13  ALKPHOS 53  BILITOT 0.7  PROT 5.9*  ALBUMIN 2.9*   No results found for this basename: LIPASE, AMYLASE,  in the last 168 hours No results found for this basename: AMMONIA,  in the last 168 hours CBC:  Recent Labs Lab 05/06/13 0945 05/07/13 0510  05/08/13 0630  WBC 6.7 4.9 4.7  NEUTROABS 5.2  --   --   HGB 11.7* 9.7* 9.4*  HCT 33.0* 27.6* 27.6*  MCV 87.8 86.8 87.9  PLT 188 192 180   Cardiac Enzymes:  Recent Labs Lab 05/06/13 0945 05/06/13 2300  TROPONINI <0.30 <0.30   BNP: No results found for this basename: PROBNP,  in the last 168 hours D-Dimer: No results found for this basename: DDIMER,  in the last 168 hours CBG: No results found for this basename: GLUCAP,  in the last 168 hours Hemoglobin A1C: No results found for this basename: HGBA1C,  in the last 168 hours Fasting Lipid Panel: No results found for this basename: CHOL, HDL, LDLCALC, TRIG, CHOLHDL, LDLDIRECT,  in the last 168 hours Thyroid Function Tests:  Recent Labs Lab 05/06/13 1928  TSH <0.008*  FREET4 3.60*   Coagulation:  Recent Labs Lab 05/07/13 0510  LABPROT 14.3  INR 1.13   Anemia Panel: No results found for this basename: VITAMINB12, FOLATE, FERRITIN, TIBC, IRON, RETICCTPCT,  in the last 168 hours Urine Drug Screen: Drugs of Abuse  No results found for this basename: labopia,  cocainscrnur,  labbenz,  amphetmu,  thcu,  labbarb    Alcohol Level: No results found for this basename:  ETH,  in the last 168 hours Urinalysis:  Recent Labs Lab 05/06/13 1453  COLORURINE YELLOW  LABSPEC 1.015  PHURINE 5.0  GLUCOSEU NEGATIVE  HGBUR NEGATIVE  BILIRUBINUR MODERATE*  KETONESUR 15*  PROTEINUR 30*  UROBILINOGEN 1.0  NITRITE NEGATIVE  LEUKOCYTESUR SMALL*   Misc. Labs:  Micro Results: Recent Results (from the past 240 hour(s))  URINE CULTURE     Status: None   Collection Time    05/06/13  2:53 PM      Result Value Range Status   Specimen Description URINE, CLEAN CATCH   Final   Special Requests NONE   Final   Culture  Setup Time     Final   Value: 05/06/2013 16:26     Performed at [facility]     Final   Value: >=100,000 COLONIES/ML     Performed at [facility]   Culture     Final   Value:  Multiple bacterial morphotypes present, none predominant. Suggest appropriate recollection if clinically indicated.     Performed at [facility]   Report Status 05/07/2013 FINAL   Final   Studies/Results: No results found. Medications:  Scheduled Meds:  amLODipine  10 mg Oral Daily   atorvastatin  40 mg Oral QHS   docusate sodium  100 mg Oral Daily   dorzolamide-timolol  1 drop Both Eyes Daily   feeding supplement  1 Container Oral TID BM   heparin  5,000 Units Subcutaneous Q8H   metoprolol tartrate  25 mg Oral BID   pantoprazole  40 mg Oral BID AC   sodium chloride  3 mL Intravenous Q12H   sucralfate  1 g Oral TID WC & HS   TDaP  0.5 mL Intramuscular Once   tobramycin  1 drop Right Eye QID   Continuous Infusions:  sodium chloride 75 mL/hr at 05/09/13 2248   PRN Meds:.HYDROcodone-acetaminophen Assessment/Plan:  #Syncope : resolved. Likely due to dehydration. Another possibility is cardiac arrythmia. She has abnormal EKG with frequent PAC on 04/09/13. She also reports having palpitation recently. Other DD include, but less likely, TIA or stroke (no focal neurological findings on exam and no extremety weakness or sensation change); ACS (EKG has TWI, but no significant change compare to previous EKG 04/09/13); PE (Well's score is low probability), hypoglycemia (not on hypoglycemic medications a home, her CBG=110 on admission) and vasovagal syncope (she did not have any premonitory symptoms before). Condition improved overnight with IVF rehydration.   -PT/OT recommend SNF placement -NS 75 cc/h  -Tele  -Fall precautions   #: HTN: bp 152/48 mmHg. Continue Amlodipine 10mg  and metoprolol 25 bid   #Abdominal Pain - likely due to gastric AVMs and gastric ulcers as evidenced by EGD.   -Appreciate GI's consult in managing our pt. -will follow up recs: Pantoprazole BID , Sucralfate TID,  - Await biopsy results. - f/u scheduled with Dr. Benson Norway for 9:30 Monday 05/24/13    #Multinodular Goiter - [place] positive for multinodular goiter with discreet 1.2cm nodule, TSH and T4 show hyperthyroidism today, however patient is asymptomatic.  -f/u appt made for Tuesday August 12th at 1:30 with PCP (Dr. Denton Brick) for hospital f/u and hyperthyroidism   # Occular issues - taking gentamicin drops for her right eye for "glaucoma" per the patient. Patient sees outside ophthamology, will continue home gentamicin.   # Constipation: on colace without improvement, Miralax yesterday resulted in 2 BMs. -Continue colace  # DVT px:  -Heparin 5000 u  TID  #: Deposition: plan SNF. SW consulted for looking for Bed. Likely dc on today 8/11 or 8/12 when bed is available.    LOS: 4 days   Howie Ill 05/10/2013, 8:06 AM

## 2013-05-10 NOTE — Progress Notes (Signed)
Patient refusing AM labs, Dr Lucila Maine notified and aware. Will continue to monitor. Gina [name]

## 2013-05-10 NOTE — Progress Notes (Signed)
I have seen the patient and reviewed the daily progress note by medical student,  Wynelle Beckmann and discussed the care of the patient with them. Please see my note for documentation of my findings, assessment, and plans  Ivor Costa, MD PGY3, Internal Medicine Teaching Service Pager: [phone]

## 2013-05-10 NOTE — Progress Notes (Signed)
Physical Therapy Treatment Patient Details Name: Gina [name] MRN: [id number] DOB: 26-Oct-1928 Today's Date: 05/10/2013 Time: [id number] PT Time Calculation (min): 18 min  PT Assessment / Plan / Recommendation  History of Present Illness 79 year old female who was schedule to have an EGD, but she experienced a syncopal episode this morning.  She got up from her bed and walked to the door to let her aid into her house and then she collapsed.  There was a brief loss of consciousness.  The work up in the ER was negative for any cerebral trauma or any fractures.  It was felt that she was dehydrated.  She will be admitted to the hospital for further evaluation.   PT Comments   Pt progressing with PT goals & mobility at this date.  Increased ambulation distance.  Cont to recommend SNF at d/c to maximize independence & safety.     Follow Up Recommendations  SNF     Does the patient have the potential to tolerate intense rehabilitation     Barriers to Discharge        Equipment Recommendations  Rolling walker with 5" wheels    Recommendations for Other Services    Frequency Min 3X/week   Progress towards PT Goals Progress towards PT goals: Progressing toward goals  Plan Current plan remains appropriate    Precautions / Restrictions Precautions Precautions: Fall Restrictions Weight Bearing Restrictions: No   Pertinent Vitals/Pain C/o low back discomfort 2/2 fall.      Mobility  Bed Mobility Bed Mobility: Not assessed Transfers Transfers: Sit to Stand;Stand to Sit Sit to Stand: 4: Min guard;With upper extremity assist;From bed Stand to Sit: 4: Min guard;With upper extremity assist;With armrests;To chair/3-in-1 Details for Transfer Assistance: VC's for hand placement during transfers. Pt moves slowly during transfers, also c/o low back pain that pt reports resolved w/ functional mobility/ambulation in room/hallway. Ambulation/Gait Ambulation/Gait Assistance: 4: Min  guard Ambulation Distance (Feet): 150 Feet Assistive device: None Ambulation/Gait Assistance Details: Guarding for safety.  Pt slightly unsteady but no overt LOB.  Ambulated with slow cautious gait speed.   Gait Pattern: Step-through pattern;Narrow base of support Gait velocity: decreased Stairs: No Wheelchair Mobility Wheelchair Mobility: No    Exercises General Exercises - Lower Extremity Long Arc Quad: Both;10 reps;Strengthening Hip Flexion/Marching: Strengthening;Both;10 reps Toe Raises: Both;10 reps Heel Raises: Both;10 reps     PT Goals (current goals can now be found in the care plan section) Acute Rehab PT Goals Patient Stated Goal: To get stronger and figure out whats going on PT Goal Formulation: With patient Time For Goal Achievement: 05/14/13 Potential to Achieve Goals: Good  Visit Information  Last PT Received On: 05/10/13 Assistance Needed: +1 History of Present Illness: 79 year old female who was schedule to have an EGD, but she experienced a syncopal episode this morning.  She got up from her bed and walked to the door to let her aid into her house and then she collapsed.  There was a brief loss of consciousness.  The work up in the ER was negative for any cerebral trauma or any fractures.  It was felt that she was dehydrated.  She will be admitted to the hospital for further evaluation.    Subjective Data  Patient Stated Goal: To get stronger and figure out whats going on   Cognition  Cognition Arousal/Alertness: Awake/alert Behavior During Therapy: [facility] for tasks assessed/performed Overall Cognitive Status: Within Functional Limits for tasks assessed    Balance  Balance Balance Assessed: Yes Static Sitting Balance Static Sitting - Balance Support: Feet supported Static Sitting - Level of Assistance: 6: Modified independent (Device/Increase time) Static Standing Balance Static Standing - Balance Support: Left upper extremity supported;During functional  activity  End of Session PT - End of Session Equipment Utilized During Treatment: Gait belt Activity Tolerance: Patient tolerated treatment well Patient left: in chair;with call bell/phone within reach Nurse Communication: Mobility status   GP     Sena Hitch 05/10/2013, 9:02 AM  Sarajane Marek, PTA [phone] 05/10/2013

## 2013-05-10 NOTE — Progress Notes (Signed)
Subjective: - Patient feels better today. Feels ready to go to SNF. - Tolerated EGD procedure. There are a bleeding proximal gastric AVM s/p APC and ablation.  - Patient has had good BW X 2 - PO intake is improving.   Objective: Vital signs in last 24 hours: Filed Vitals:   05/10/13 0407 05/10/13 0517 05/10/13 0900 05/10/13 1413  BP:  152/48 138/47 156/48  Pulse:  68 61 62  Temp:  98 F (36.7 C)  97.5 F (36.4 C)  TempSrc:  Oral  Oral  Resp:  17  18  Height:      Weight: 109 lb (49.442 kg)     SpO2:  94%  99%   Weight change: 1 lb 6.4 oz (0.635 kg)  Intake/Output Summary (Last 24 hours) at 05/10/13 1545 Last data filed at 05/10/13 1532  Gross per 24 hour  Intake   3475 ml  Output   2673 ml  Net    802 ml    General: Not in acute distress.  HEENT: EOMI, no scleral icterus, laceration on posterior scalp Cardiac: S1/S2, RRR, No murmurs, gallops or rubs   Pulm: Good air movement bilaterally. Clear to auscultation bilaterally. No rales, wheezing, rhonchi or rubs.   Abd: Soft, nondistended, BS present, there is mild tenderness over RUQ Ext: No edema   Musculoskeletal: No joint deformities, erythema, or stiffness Skin: No rashes.   Neuro: Alert and oriented X3  Lab Results: Basic Metabolic Panel:  Recent Labs Lab 05/07/13 0510 05/08/13 0630 05/10/13 0900  NA 143 141 142  K 4.1 3.4* 3.6  CL 105 108 110  CO2 26 24 23   GLUCOSE 85 136* 171*  BUN 53* 31* 9  CREATININE 0.99 0.71 0.54  CALCIUM 9.3 9.2 9.1  MG 2.1 1.7  --   PHOS  --  2.9  --    Liver Function Tests:  Recent Labs Lab 05/07/13 0510 05/10/13 0900  AST 16 15  ALT 13 15  ALKPHOS 53 68  BILITOT 0.7 0.5  PROT 5.9* 5.7*  ALBUMIN 2.9* 2.6*   No results found for this basename: LIPASE, AMYLASE,  in the last 168 hours No results found for this basename: AMMONIA,  in the last 168 hours CBC:  Recent Labs Lab 05/06/13 0945  05/08/13 0630 05/10/13 0900  WBC 6.7  < > 4.7 4.5  NEUTROABS 5.2  --   --   2.5  HGB 11.7*  < > 9.4* 9.5*  HCT 33.0*  < > 27.6* 27.1*  MCV 87.8  < > 87.9 85.8  PLT 188  < > 180 211  < > = values in this interval not displayed. Cardiac Enzymes:  Recent Labs Lab 05/06/13 0945 05/06/13 2300  TROPONINI <0.30 <0.30   BNP: No results found for this basename: PROBNP,  in the last 168 hours D-Dimer: No results found for this basename: DDIMER,  in the last 168 hours CBG: No results found for this basename: GLUCAP,  in the last 168 hours Hemoglobin A1C: No results found for this basename: HGBA1C,  in the last 168 hours Fasting Lipid Panel: No results found for this basename: CHOL, HDL, LDLCALC, TRIG, CHOLHDL, LDLDIRECT,  in the last 168 hours Thyroid Function Tests:  Recent Labs Lab 05/06/13 1928  TSH <0.008*  FREET4 3.60*   Coagulation:  Recent Labs Lab 05/07/13 0510  LABPROT 14.3  INR 1.13   Anemia Panel: No results found for this basename: VITAMINB12, FOLATE, FERRITIN, TIBC, IRON, RETICCTPCT,  in the  last 168 hours Urine Drug Screen: Drugs of Abuse  No results found for this basename: labopia,  cocainscrnur,  labbenz,  amphetmu,  thcu,  labbarb    Alcohol Level: No results found for this basename: ETH,  in the last 168 hours Urinalysis:  Recent Labs Lab 05/06/13 [place]  LABSPEC 1.015  PHURINE 5.0  GLUCOSEU NEGATIVE  HGBUR NEGATIVE  BILIRUBINUR MODERATE*  KETONESUR 15*  PROTEINUR 30*  UROBILINOGEN 1.0  NITRITE NEGATIVE  LEUKOCYTESUR SMALL*   Misc. Labs:  Micro Results: Recent Results (from the past 240 hour(s))  URINE CULTURE     Status: None   Collection Time    05/06/13  2:53 PM      Result Value Range Status   Specimen Description URINE, CLEAN CATCH   Final   Special Requests NONE   Final   Culture  Setup Time     Final   Value: 05/06/2013 16:26     Performed at [place]     Final   Value: >=100,000 COLONIES/ML     Performed at [facility]   Culture     Final    Value: Multiple bacterial morphotypes present, none predominant. Suggest appropriate recollection if clinically indicated.     Performed at [facility]   Report Status 05/07/2013 FINAL   Final   Studies/Results: No results found. Medications:  Scheduled Meds: . amLODipine  10 mg Oral Daily  . atorvastatin  40 mg Oral QHS  . docusate sodium  100 mg Oral Daily  . dorzolamide-timolol  1 drop Both Eyes Daily  . feeding supplement  1 Container Oral TID BM  . heparin  5,000 Units Subcutaneous Q8H  . metoprolol tartrate  25 mg Oral BID  . pantoprazole  40 mg Oral BID AC  . sodium chloride  3 mL Intravenous Q12H  . sucralfate  1 g Oral TID WC & HS  . TDaP  0.5 mL Intramuscular Once  . tobramycin  1 drop Right Eye QID   Continuous Infusions: . sodium chloride 75 mL/hr at 05/10/13 1125   PRN Meds:.HYDROcodone-acetaminophen Assessment/Plan:  #Syncope : resolved. Likely due to dehydration. Another possibility is cardiac arrythmia. She has abnormal EKG with frequent PAC on 04/09/13. She also reports having palpitation recently. Other DD include, but less likely, TIA or stroke (no focal neurological findings on exam and no extremety weakness or sensation change); ACS (EKG has TWI, but no significant change compare to previous EKG 04/09/13); PE (Well's score is low probability), hypoglycemia (not on hypoglycemic medications a home, her CBG=110 on admission) and vasovagal syncope (she did not have any premonitory symptoms before). Condition improved overnight with IVF rehydration.   -PT/OT eval -will d/c IVF  -Tele  -Fall precautions   #: HTN: bp 131/43 mmHg. Continue Amlodipine 10mg  and metoprolol 12.5   #Abdominal Pain - improving. likely due to gastric AVMs and gastric ulcers as evidenced by EGD. Stomach biopsy: REACTIVE GASTROPATHY. NEGATIVE FOR HELICOBACTER PYLORI. NO INTESTINAL METAPLASIA, DYSPLASIA, OR MALIGNANCY.  Esophagus biopsy: NO EVIDENCE OF SIGNIFICANT INFLAMMATION,  INTESTINAL METAPLASIA, DYSPLASIA OR MALIGNANCY IDENTIFIED.  -Appreciate GI's consult in managing our pt. -will follow up recs: Pantoprazole BID , Sucralfate TID,  - Await biopsy results. - - f/u scheduled with Dr. Benson Norway for 9:30 Monday 05/24/13   #Multinodular Goiter - [place] positive for multinodular goiter with discreet 1.2cm nodule, TSH and T4 show hyperthyroidism today, however patient is asymptomatic.  -f/u appt made for  Tuesday August 12th at 1:30 with PCP (Dr. Denton Brick) for hospital f/u and hyperthyroidism   # Occular issues - taking gentamicin drops for her right eye for "glaucoma" per the patient. Patient sees outside ophthamology, will continue home gentamicin.   # Constipation: Improved significantly after one dose of MiraLax.  - will continue colace  # DVT px:  -Heparin 5000 u TID  #: Deposition: plan SNF. SW consulted for looking for Bed. Likely dc on 8/12 when bed is available.    LOS: 4 days   Ivor Costa 05/10/2013, 3:45 PM

## 2013-05-11 ENCOUNTER — Encounter: Payer: Medicare Other | Admitting: Internal Medicine

## 2013-05-11 DIAGNOSIS — K5900 Constipation, unspecified: Secondary | ICD-10-CM

## 2013-05-11 DIAGNOSIS — N179 Acute kidney failure, unspecified: Secondary | ICD-10-CM

## 2013-05-11 MED ORDER — SUCRALFATE 1 G PO TABS: 1.0000 g | ORAL_TABLET | Freq: Three times a day (TID) | ORAL | Status: DC

## 2013-05-11 MED ORDER — DSS 100 MG PO CAPS: 100.0000 mg | ORAL_CAPSULE | Freq: Every day | ORAL | Status: DC

## 2013-05-11 MED ORDER — AMLODIPINE BESYLATE 10 MG PO TABS: 10.0000 mg | ORAL_TABLET | Freq: Every day | ORAL | Status: DC

## 2013-05-11 MED ORDER — METOPROLOL TARTRATE 25 MG PO TABS: 25.0000 mg | ORAL_TABLET | Freq: Two times a day (BID) | ORAL | Status: DC

## 2013-05-11 MED ORDER — HYDROCODONE-ACETAMINOPHEN 5-325 MG PO TABS: 1.0000 | ORAL_TABLET | Freq: Three times a day (TID) | ORAL | Status: DC | PRN

## 2013-05-11 MED ORDER — PANTOPRAZOLE SODIUM 40 MG PO TBEC: 40.0000 mg | DELAYED_RELEASE_TABLET | Freq: Two times a day (BID) | ORAL | Status: DC

## 2013-05-11 NOTE — Discharge Summary (Signed)
  Date: 05/11/2013  Patient name: Gina [name]  Medical record number: [id number]  Date of birth: 1929/07/23   This patient has been seen and the plan of care was discussed with the house staff. Please see their note for complete details. I concur with their findings with the following additions/corrections:  Agree with the plan as described by Dr. Blaine Hamper. [name] is having quick improvement after having her EGD. She reports healthy appetite and increased energy. She is likely discharging to SNF for a short period of time if she continues with this pace of improvement. We will establish follow up with Dr. Benson Norway and Dr. Cruzita Lederer, as new patient for thyroid disorder management.  Carlyle Basques, MD 05/11/2013, 7:10 PM

## 2013-05-11 NOTE — Care Management Note (Signed)
    Page 1 of 1   05/11/2013     11:45:51 AM   CARE MANAGEMENT NOTE 05/11/2013  Patient:  Gina [name], Gina [name]   Account Number:  [id number]  Date Initiated:  05/11/2013  Documentation initiated by:  Fuller Mandril  Subjective/Objective Assessment:   79 y.o. woman with a history of DM-II (A1c 6.2 on 01/28/13), HTN, partial right mastectomy for intraductal ca, hyperlipidemia, peripheral vascular disease, who presents with syncope.//home with spouse     Action/Plan:   Hydrate, UA; SNF placement ([facility])   Anticipated DC Date:  05/12/2013   Anticipated DC Plan:  SKILLED NURSING FACILITY  In-house referral  Clinical Social Worker      DC Planning Services  CM consult      Choice offered to / List presented to:             Status of service:   Medicare Important Message given?   (If response is "NO", the following Medicare IM given date fields will be blank) Date Medicare IM given:   Date Additional Medicare IM given:    Discharge Disposition:    Per UR Regulation:    If discussed at Long Length of Stay Meetings, dates discussed:    Comments:  05/11/13 [place], RN, BSN, [place] [phone] Pt for SNF placement to [facility].  NCM consult for HHPT. [facility] has in house PT and will set up home PT for pt upon D/C home.

## 2013-05-11 NOTE — Progress Notes (Signed)
Pt A/O x4, standby assist to [facility], steady on feet. Pt placed on high fall precautions due to falls at home. Will continue to monitor pt. Shelby Mattocks, RN

## 2013-05-11 NOTE — Progress Notes (Signed)
Physical Therapy Treatment Patient Details Name: Gina [name] MRN: [id number] DOB: [birth year] Today's Date: 05/11/2013 Time: [id number] PT Time Calculation (min): 23 min  PT Assessment / Plan / Recommendation  History of Present Illness 79 year old female who was schedule to have an EGD, but she experienced a syncopal episode this morning.  She got up from her bed and walked to the door to let her aid into her house and then she collapsed.  There was a brief loss of consciousness.  The work up in the ER was negative for any cerebral trauma or any fractures.  It was felt that she was dehydrated.  She will be admitted to the hospital for further evaluation.   PT Comments   Pt very pleasant & motivated to participate in therapy.  She ambulated ~300' with use of RW with increased steadiness noted.  Pt feels she is close to baseline with mobility but cont to recommend SNF at d/c to maximize balance, strength, & safety with mobility due to pt lives alone.      Follow Up Recommendations  SNF     Does the patient have the potential to tolerate intense rehabilitation     Barriers to Discharge        Equipment Recommendations  Rolling walker with 5" wheels    Recommendations for Other Services    Frequency Min 3X/week   Progress towards PT Goals Progress towards PT goals: Progressing toward goals  Plan Current plan remains appropriate    Precautions / Restrictions Precautions Precautions: Fall Restrictions Weight Bearing Restrictions: No   Pertinent Vitals/Pain C/o mild back pain due to fall.      Mobility  Bed Mobility Bed Mobility: Supine to Sit;Sitting - Scoot to Edge of Bed Supine to Sit: 6: Modified independent (Device/Increase time) Sitting - Scoot to Edge of Bed: 6: Modified independent (Device/Increase time) Transfers Transfers: Sit to Stand;Stand to Sit Sit to Stand: 5: Supervision;With upper extremity assist;From bed;From toilet Stand to Sit: 5: Supervision;With upper  extremity assist;To bed;To toilet Details for Transfer Assistance: Supervision for safety Ambulation/Gait Ambulation/Gait Assistance: 5: Supervision Ambulation Distance (Feet): 300 Feet Assistive device: Rolling walker Ambulation/Gait Assistance Details: Pt more steady with use of RW.  Performed various gait challenges such as head turns, directional changes, & gait speed changes.   Gait Pattern: Step-through pattern (decreased floor clearance) Gait velocity: decreased Stairs: No Wheelchair Mobility Wheelchair Mobility: No      PT Goals (current goals can now be found in the care plan section) Acute Rehab PT Goals PT Goal Formulation: With patient Time For Goal Achievement: 05/14/13 Potential to Achieve Goals: Good  Visit Information  Last PT Received On: 05/11/13 Assistance Needed: +1 History of Present Illness: 79 year old female who was schedule to have an EGD, but she experienced a syncopal episode this morning.  She got up from her bed and walked to the door to let her aid into her house and then she collapsed.  There was a brief loss of consciousness.  The work up in the ER was negative for any cerebral trauma or any fractures.  It was felt that she was dehydrated.  She will be admitted to the hospital for further evaluation.    Subjective Data  Subjective: "I think I could go home now"   Cognition  Cognition Arousal/Alertness: Awake/alert Behavior During Therapy: WFL for tasks assessed/performed Overall Cognitive Status: Within Functional Limits for tasks assessed    Balance  Balance Balance Assessed: Yes Static Standing  Balance Static Standing - Balance Support: Right upper extremity supported;Left upper extremity supported;No upper extremity supported Static Standing - Level of Assistance: 5: Stand by assistance;4: Min assist Static Standing - Comment/# of Minutes: SBA with standing with eyes closed due to mild anterior-posterior sway; Min (A) + UE supported for SLS  x 30 secs.   Dynamic Standing Balance Dynamic Standing - Balance Support: No upper extremity supported Dynamic Standing - Level of Assistance: 5: Stand by assistance Dynamic Standing - Balance Activities: Lateral lean/weight shifting;Forward lean/weight shifting;Reaching across midline;Other (comment) (picking object up off floor)  End of Session PT - End of Session Activity Tolerance: Patient tolerated treatment well Patient left: with call bell/phone within reach (sitting EOB ) Nurse Communication: Mobility status     Sarajane Marek, [place] [phone] 05/11/2013

## 2013-05-11 NOTE — Clinical Social Work Placement (Signed)
     Clinical Social Work Department CLINICAL SOCIAL WORK PLACEMENT NOTE 05/11/2013  Patient:  Gina [name], Gina [name]  Account Number:  [id number] Admit date:  05/06/2013  Clinical Social Worker:  Caren Hazy, LCSW  Date/time:  05/09/2013 12:00 M  Clinical Social Work is seeking post-discharge placement for this patient at the following level of care:   [facility]   (*CSW will update this form in Epic as items are completed)   05/09/2013  Patient/family provided with [place] Department of Clinical Social Works list of facilities offering this level of care within the geographic area requested by the patient (or if unable, by the patients family).  05/09/2013  Patient/family informed of their freedom to choose among providers that offer the needed level of care, that participate in Medicare, Medicaid or managed care program needed by the patient, have an available bed and are willing to accept the patient.  05/09/2013  Patient/family informed of [facility] ownership interest in [facility], as well as of the fact that they are under no obligation to receive care at this facility.  PASARR submitted to EDS on 05/09/2013 PASARR number received from EDS on 05/09/2013  FL2 transmitted to all facilities in geographic area requested by pt/family on  05/09/2013 FL2 transmitted to all facilities within larger geographic area on   Patient informed that his/her managed care company has contracts with or will negotiate with  certain facilities, including the following:   [facility] Medicare Complete     Patient/family informed of bed offers received:  05/11/2013 Patient chooses bed at [facility] Physician recommends and patient chooses bed at    Patient to be transferred to [facility] on  05/11/2013 Patient to be transferred to facility by [facility] with son  The following physician request were entered in Epic:   Additional  Comments: 05/11/13  Ok per MD for d/c today to SNF. Patient is very pleased with this plan and feels she is much improved and will only require short term stay. CSW also spoke to her son Konrad Dolores who was very pleased wtih d/c plan and will plan to pick up patient via car and transport her to facility. Notified pt's nurse- Nellie of d/c plan and she will call report. No further CSW needs identified. CSW signing off. Lorie Phenix. [place], [place]

## 2013-05-11 NOTE — Progress Notes (Signed)
Report called to Danae Chen, nurse a at [facility] where pt being d/c today,.  Pt  D/c off floor via w/c.  D/c packet given toTanya, grandaughter of pt.  Verbalized understanding of d/c instructions.

## 2013-05-12 ENCOUNTER — Encounter: Payer: Self-pay | Admitting: Internal Medicine

## 2013-05-12 ENCOUNTER — Non-Acute Institutional Stay (SKILLED_NURSING_FACILITY): Payer: Medicare Other | Admitting: Internal Medicine

## 2013-05-12 DIAGNOSIS — E119 Type 2 diabetes mellitus without complications: Secondary | ICD-10-CM

## 2013-05-12 DIAGNOSIS — D649 Anemia, unspecified: Secondary | ICD-10-CM

## 2013-05-12 DIAGNOSIS — I10 Essential (primary) hypertension: Secondary | ICD-10-CM

## 2013-05-12 DIAGNOSIS — W19XXXD Unspecified fall, subsequent encounter: Secondary | ICD-10-CM

## 2013-05-12 DIAGNOSIS — W19XXXA Unspecified fall, initial encounter: Secondary | ICD-10-CM

## 2013-05-12 DIAGNOSIS — R1012 Left upper quadrant pain: Secondary | ICD-10-CM

## 2013-05-12 DIAGNOSIS — Z8719 Personal history of other diseases of the digestive system: Secondary | ICD-10-CM | POA: Insufficient documentation

## 2013-05-12 DIAGNOSIS — E43 Unspecified severe protein-calorie malnutrition: Secondary | ICD-10-CM

## 2013-05-12 DIAGNOSIS — E041 Nontoxic single thyroid nodule: Secondary | ICD-10-CM

## 2013-05-12 DIAGNOSIS — K259 Gastric ulcer, unspecified as acute or chronic, without hemorrhage or perforation: Secondary | ICD-10-CM

## 2013-05-12 DIAGNOSIS — E785 Hyperlipidemia, unspecified: Secondary | ICD-10-CM

## 2013-05-12 NOTE — Assessment & Plan Note (Signed)
Dx this past hospital admission and thyroid functions c/w hyperthyroid;pt is scheduled to see Dr Cruzita Lederer, [facility] 05/14/2013 to start out pt work up

## 2013-05-12 NOTE — Assessment & Plan Note (Signed)
Still high -on 3 medications already and do not want to increase the Bblocker;will start Hydralazine 10mg  po TID

## 2013-05-12 NOTE — Assessment & Plan Note (Signed)
Pt was started on PPI BID in the hospital and carafate QID and both of these are continued;pt has f/u with Dr Carol Ada 05/24/2013.
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
## 2013-05-12 NOTE — Assessment & Plan Note (Signed)
Has been followed by PCP and Hba1c was 6.2 3 months ago-diet controlled

## 2013-05-12 NOTE — Assessment & Plan Note (Signed)
Noted Hb 9.5 and it was 12.4 a year ago;probably Fe def-will order anemia panel-related to nutritional status

## 2013-05-12 NOTE — Assessment & Plan Note (Signed)
H/o- will consult PT to help pt with balance and increase strength

## 2013-05-12 NOTE — Assessment & Plan Note (Addendum)
On pravachol-last LDL 77 and HDL 62, TG 80-will continue same

## 2013-05-12 NOTE — Assessment & Plan Note (Signed)
Pt is on diet supplements to improve

## 2013-05-14 ENCOUNTER — Ambulatory Visit: Payer: Medicare Other | Admitting: Internal Medicine

## 2013-05-19 ENCOUNTER — Encounter: Payer: Self-pay | Admitting: Nurse Practitioner

## 2013-05-19 ENCOUNTER — Non-Acute Institutional Stay (SKILLED_NURSING_FACILITY): Payer: Medicare Other | Admitting: Nurse Practitioner

## 2013-05-19 DIAGNOSIS — R109 Unspecified abdominal pain: Secondary | ICD-10-CM

## 2013-05-19 DIAGNOSIS — D649 Anemia, unspecified: Secondary | ICD-10-CM

## 2013-05-19 DIAGNOSIS — E876 Hypokalemia: Secondary | ICD-10-CM

## 2013-05-19 NOTE — Progress Notes (Signed)
[name], female   DOB: Oct 27, 1928, 79 y.o.   MRN: [id number]  Nursing Home Location:  [facility]   Place of Service: SNF (31)  Chief Complaint  Patient presents with  . Acute Visit- review labs    HPI:  Patient is 79 y.o. woman who is at [facility] for STR. Pt with a history of DM-II , HTN, partial right mastectomy for intraductal ca, hyperlipidemia, peripheral vascular disease, who went to the ED with syncope.  EGD was performed by GI, Dr. Ronalee Red on 8/8. She was found to have multiple gastric AVM.  AVM were repaired with APC and ablation. Stomach biopsy which showed reactive gastropathy, negative for h pylori, no intestinal metaplasia, dysplasia or malignancy .  She was continued on  Pantoprazole and sucralfate from hospitalization. She is being seen today due to drop in hgb to 6.9.  Pt is scheduled for  with Dr. Benson Norway for 9:30 Monday 05/24/13 for follow up. Pt denies melena, changes in stool color, pattern or consistency. Pt denies weakness, dizzinesss, chest pain or palpitation, shortness of breath, nausea or vomiting. Pt does report she has occasional diarrhea and is still have abdominal pain from her hospitalization that has not improved.  Overall pt is stable without symptoms at this time.   Review of Systems:   DATA OBTAINED: from patient, nurse, medical record GENERAL: Feels well no fevers, fatigue, appetite changes MOUTH/THROAT: No mouth or tooth pain, No sore throat, No difficulty chewing or swallowing  RESPIRATORY: No cough, wheezing, SOB CARDIAC: No chest pain, palpitations, lower extremity edema  GI: has generalized abdominal pain, No N/V or constipation GU: No dysuria, frequency or urgency, or incontinence  MUSCULOSKELETAL: No unrelieved bone/joint pain NEUROLOGIC: Awake, alert, appropriate to situation, No change in mental status. Moves all four, no focal deficits    Medications: Patient's Medications  New Prescriptions   No medications on  file  Previous Medications   AMLODIPINE (NORVASC) 10 MG TABLET    Take 1 tablet (10 mg total) by mouth daily.   ATORVASTATIN (LIPITOR) 40 MG TABLET    Take 40 mg by mouth daily.   DOCUSATE SODIUM 100 MG CAPS    Take 100 mg by mouth daily.   DORZOLAMIDE-TIMOLOL (COSOPT) 22.3-6.8 MG/ML OPHTHALMIC SOLUTION    Place 1 drop into both eyes daily.    GENTAMICIN (GARAMYCIN) 0.3 % OPHTHALMIC SOLUTION    Place 1 drop into the right eye 4 (four) times daily.   HYDROCODONE-ACETAMINOPHEN (NORCO/VICODIN) 5-325 MG PER TABLET    Take 1 tablet by mouth every 8 (eight) hours as needed for pain.   LISINOPRIL (PRINIVIL,ZESTRIL) 40 MG TABLET    Take 40 mg by mouth daily.   METOPROLOL TARTRATE (LOPRESSOR) 25 MG TABLET    Take 1 tablet (25 mg total) by mouth 2 (two) times daily.   PANTOPRAZOLE (PROTONIX) 40 MG TABLET    Take 1 tablet (40 mg total) by mouth 2 (two) times daily before a meal.   SUCRALFATE (CARAFATE) 1 G TABLET    Take 1 tablet (1 g total) by mouth 4 (four) times daily -  with meals and at bedtime.  Modified Medications   No medications on file  Discontinued Medications   No medications on file     Physical Exam:  Filed Vitals:   05/19/13 1129  BP: 102/52  Pulse: 78  Temp: 97.6 F (36.4 C)  Resp: 18    Physical Exam  Constitutional: She is oriented to person, place,  and time and well-developed, well-nourished, and in no distress. No distress.  Cardiovascular: Normal rate and normal heart sounds.   Pulmonary/Chest: Effort normal and breath sounds normal.  Abdominal: Soft. Bowel sounds are normal. There is tenderness (generalized throughout abdomen).  Genitourinary: Rectum normal.  No enough stool in vault to preform adequate hemoccult   Musculoskeletal: Normal range of motion. She exhibits no edema and no tenderness.  Neurological: She is alert and oriented to person, place, and time.  Skin: Skin is warm and dry. She is not diaphoretic.  Psychiatric: Affect normal.    Labs: CBC with  3 part Diff       Result: 05/19/2013 10:46 AM    ( Status: F )            WBC  6.2        4.0-10.5  K/uL  WML       RBC  2.45     L  3.87-5.11  MIL/uL  WML       Hemoglobin  6.9     LL  12.0-15.0  g/dL  WML  C     Hematocrit  21.4     L  36.0-46.0  %  WML       MCV  87.4        78.0-100.0  fL  WML       MCH  28.2        26.0-34.0  pg  WML       MCHC  32.4        30.0-36.0  g/dL  WML       RDW  13.5        11.5-15.5  %  WML       Platelet Count  203        150-400  K/uL  WML       Granulocyte %  71        43-77  %  WML       Absolute Gran  4.4        1.7-7.7  K/uL  WML       Lymph %  24        12-46  %  WML       Absolute Lymph  1.5        0.7-4.0  K/uL  WML       Mono %  5        3-12  %  WML       Absolute Mono  0.3        0.1-1.0  K/uL  WML       Smear Review  Criteria for review not met   WML      Basic Metabolic Panel       Result: 05/19/2013 10:44 AM    ( Status: F )            Sodium  137        135-145  mEq/L  WML       Potassium  2.7     LL  3.5-5.3  mEq/L  WML  C     Chloride  103        96-112  mEq/L  WML       CO2  26        19-32  mEq/L  WML       Glucose  143     H  70-99  mg/dL  WML  BUN  12        6-23  mg/dL  WML       Creatinine  0.60        0.50-1.10  mg/dL  WML       Calcium  8.9    05/14/2013 12:37 PM    ( Status: F )       C     WBC  5.1        4.0-10.5  K/uL  SLN       RBC  2.85     L  3.87-5.11  MIL/uL  SLN       Hemoglobin  8.2     L  12.0-15.0  g/dL  SLN       Hematocrit  24.3     L  36.0-46.0  %  SLN       MCV  85.3        78.0-100.0  fL  SLN       MCH  28.8        26.0-34.0  pg  SLN       MCHC  33.7        30.0-36.0  g/dL  SLN       RDW  14.0        11.5-15.5  %  SLN       Platelet Count  228        150-400  K/uL  SLN       Granulocyte %  56        43-77  %  SLN       Absolute Gran  2.8        1.7-7.7  K/uL  SLN       Lymph %  27        12-46  %  SLN       Absolute Lymph  1.4        0.7-4.0  K/uL  SLN       Mono %  13     H  3-12  %  SLN        Absolute Mono  0.7        0.1-1.0  K/uL  SLN       Eos %  4        0-5  %  SLN       Absolute Eos  0.2        0.0-0.7  K/uL  SLN       Baso %  0        0-1  %  SLN       Absolute Baso  0.0        0   Assessment/Plan 1. Anemia Will have pt scheduled for outpatient transfusion - to be typed and crossed and given 2 units PRBCs at this time.  hemoccult stool x 3 ordered and results to be sent with pt to Dr Minerva Areola office when she has her follow up on 8/25  2. Hypokalemia 40 meq potassium given and will be repeated in 6 hours- pt to get follow up BMP in am   3. Abdominal  pain, other specified site Ongoing abdominal pain still requiring PRN pain medication for this. Pain is adequately controlled with PRN; pt to follow up on 8/25 with Dr Benson Norway

## 2013-05-20 ENCOUNTER — Other Ambulatory Visit (HOSPITAL_COMMUNITY): Payer: Self-pay | Admitting: *Deleted

## 2013-05-21 ENCOUNTER — Ambulatory Visit (HOSPITAL_COMMUNITY)
Admission: RE | Admit: 2013-05-21 | Discharge: 2013-05-21 | Disposition: A | Discharge status: Home / Self Care | Payer: Medicare Other | Source: Ambulatory Visit | Attending: Internal Medicine | Admitting: Internal Medicine

## 2013-05-21 DIAGNOSIS — K922 Gastrointestinal hemorrhage, unspecified: Secondary | ICD-10-CM | POA: Insufficient documentation

## 2013-05-21 DIAGNOSIS — E041 Nontoxic single thyroid nodule: Secondary | ICD-10-CM | POA: Insufficient documentation

## 2013-05-21 DIAGNOSIS — M5030 Other cervical disc degeneration, unspecified cervical region: Secondary | ICD-10-CM | POA: Insufficient documentation

## 2013-05-21 DIAGNOSIS — I10 Essential (primary) hypertension: Secondary | ICD-10-CM | POA: Insufficient documentation

## 2013-05-21 DIAGNOSIS — D649 Anemia, unspecified: Secondary | ICD-10-CM | POA: Insufficient documentation

## 2013-05-21 DIAGNOSIS — K259 Gastric ulcer, unspecified as acute or chronic, without hemorrhage or perforation: Secondary | ICD-10-CM | POA: Insufficient documentation

## 2013-05-21 DIAGNOSIS — E785 Hyperlipidemia, unspecified: Secondary | ICD-10-CM | POA: Insufficient documentation

## 2013-05-21 DIAGNOSIS — E43 Unspecified severe protein-calorie malnutrition: Secondary | ICD-10-CM | POA: Insufficient documentation

## 2013-05-21 LAB — ABO/RH: ABO/RH(D): O POS

## 2013-05-21 LAB — PREPARE RBC (CROSSMATCH)

## 2013-05-21 NOTE — Progress Notes (Signed)
IVAC pump beeped occulusion. IV flushed, patient c/o slight stinging. IV dressing removed, catheter checked, re-taped, flushed again with no complaints. Good blood return on aspiration. Blood restarted at 50 ml/hr. Tolerated well. Vital sign times changed to reflect delay.

## 2013-05-22 LAB — TYPE AND SCREEN
ABO/RH(D): O POS
Antibody Screen: NEGATIVE
Unit division: 0
Unit division: 0

## 2013-05-24 ENCOUNTER — Other Ambulatory Visit: Payer: Self-pay | Admitting: Gastroenterology

## 2013-05-26 ENCOUNTER — Encounter: Payer: Self-pay | Admitting: Nurse Practitioner

## 2013-05-26 ENCOUNTER — Non-Acute Institutional Stay (SKILLED_NURSING_FACILITY): Payer: Medicare Other | Admitting: Nurse Practitioner

## 2013-05-26 DIAGNOSIS — E43 Unspecified severe protein-calorie malnutrition: Secondary | ICD-10-CM

## 2013-05-26 DIAGNOSIS — I10 Essential (primary) hypertension: Secondary | ICD-10-CM

## 2013-05-26 DIAGNOSIS — D649 Anemia, unspecified: Secondary | ICD-10-CM

## 2013-05-26 DIAGNOSIS — K259 Gastric ulcer, unspecified as acute or chronic, without hemorrhage or perforation: Secondary | ICD-10-CM

## 2013-05-26 DIAGNOSIS — R600 Localized edema: Secondary | ICD-10-CM

## 2013-05-26 DIAGNOSIS — R609 Edema, unspecified: Secondary | ICD-10-CM
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
## 2013-05-28 ENCOUNTER — Encounter (HOSPITAL_COMMUNITY)
Admission: RE | Disposition: A | Discharge status: Home / Self Care | Payer: Self-pay | Source: Ambulatory Visit | Attending: Gastroenterology

## 2013-05-28 ENCOUNTER — Ambulatory Visit (HOSPITAL_COMMUNITY)
Admission: RE | Admit: 2013-05-28 | Discharge: 2013-05-28 | Disposition: A | Discharge status: Home / Self Care | Payer: Medicare Other | Source: Ambulatory Visit | Attending: Gastroenterology | Admitting: Gastroenterology

## 2013-05-28 ENCOUNTER — Encounter (HOSPITAL_COMMUNITY): Payer: Self-pay | Admitting: *Deleted

## 2013-05-28 DIAGNOSIS — R7611 Nonspecific reaction to tuberculin skin test without active tuberculosis: Secondary | ICD-10-CM | POA: Insufficient documentation

## 2013-05-28 DIAGNOSIS — Z809 Family history of malignant neoplasm, unspecified: Secondary | ICD-10-CM | POA: Insufficient documentation

## 2013-05-28 DIAGNOSIS — E43 Unspecified severe protein-calorie malnutrition: Secondary | ICD-10-CM | POA: Insufficient documentation

## 2013-05-28 DIAGNOSIS — Z853 Personal history of malignant neoplasm of breast: Secondary | ICD-10-CM | POA: Insufficient documentation

## 2013-05-28 DIAGNOSIS — R195 Other fecal abnormalities: Secondary | ICD-10-CM | POA: Insufficient documentation

## 2013-05-28 DIAGNOSIS — E119 Type 2 diabetes mellitus without complications: Secondary | ICD-10-CM | POA: Insufficient documentation

## 2013-05-28 DIAGNOSIS — K31819 Angiodysplasia of stomach and duodenum without bleeding: Secondary | ICD-10-CM | POA: Insufficient documentation

## 2013-05-28 DIAGNOSIS — Z888 Allergy status to other drugs, medicaments and biological substances status: Secondary | ICD-10-CM | POA: Insufficient documentation

## 2013-05-28 DIAGNOSIS — Z9010 Acquired absence of unspecified breast and nipple: Secondary | ICD-10-CM | POA: Insufficient documentation

## 2013-05-28 DIAGNOSIS — D649 Anemia, unspecified: Secondary | ICD-10-CM | POA: Insufficient documentation

## 2013-05-28 DIAGNOSIS — Z79899 Other long term (current) drug therapy: Secondary | ICD-10-CM | POA: Insufficient documentation

## 2013-05-28 DIAGNOSIS — I10 Essential (primary) hypertension: Secondary | ICD-10-CM | POA: Insufficient documentation

## 2013-05-28 DIAGNOSIS — I739 Peripheral vascular disease, unspecified: Secondary | ICD-10-CM | POA: Insufficient documentation

## 2013-05-28 DIAGNOSIS — K319 Disease of stomach and duodenum, unspecified: Secondary | ICD-10-CM | POA: Insufficient documentation

## 2013-05-28 DIAGNOSIS — F17200 Nicotine dependence, unspecified, uncomplicated: Secondary | ICD-10-CM | POA: Insufficient documentation

## 2013-05-28 DIAGNOSIS — E785 Hyperlipidemia, unspecified: Secondary | ICD-10-CM | POA: Insufficient documentation

## 2013-05-28 DIAGNOSIS — Z8049 Family history of malignant neoplasm of other genital organs: Secondary | ICD-10-CM | POA: Insufficient documentation

## 2013-05-28 HISTORY — PX: HOT HEMOSTASIS: SHX5433

## 2013-05-28 HISTORY — DX: Unspecified osteoarthritis, unspecified site: M19.90

## 2013-05-28 HISTORY — PX: ESOPHAGOGASTRODUODENOSCOPY: SHX5428

## 2013-05-28 SURGERY — EGD (ESOPHAGOGASTRODUODENOSCOPY)
Anesthesia: Moderate Sedation

## 2013-05-28 MED ORDER — GLUCAGON HCL (RDNA) 1 MG IJ SOLR
INTRAMUSCULAR | Status: AC
  Filled 2013-05-28: qty 1

## 2013-05-28 MED ORDER — GLUCAGON HCL (RDNA) 1 MG IJ SOLR
INTRAMUSCULAR | Status: DC | PRN
  Administered 2013-05-28: 1 mg via INTRAVENOUS

## 2013-05-28 MED ORDER — MIDAZOLAM HCL 10 MG/2ML IJ SOLN
INTRAMUSCULAR | Status: AC
  Filled 2013-05-28: qty 4

## 2013-05-28 MED ORDER — FENTANYL CITRATE 0.05 MG/ML IJ SOLN
INTRAMUSCULAR | Status: AC
  Filled 2013-05-28: qty 2

## 2013-05-28 MED ORDER — MIDAZOLAM HCL 10 MG/2ML IJ SOLN
INTRAMUSCULAR | Status: DC | PRN
  Administered 2013-05-28: 1 mg via INTRAVENOUS
  Administered 2013-05-28 (×2): 2 mg via INTRAVENOUS

## 2013-05-28 MED ORDER — FENTANYL CITRATE 0.05 MG/ML IJ SOLN
INTRAMUSCULAR | Status: DC | PRN
  Administered 2013-05-28 (×2): 25 ug via INTRAVENOUS

## 2013-05-28 MED ORDER — SODIUM CHLORIDE 0.9 % IV SOLN: INTRAVENOUS | Status: DC

## 2013-05-28 MED ORDER — DIPHENHYDRAMINE HCL 50 MG/ML IJ SOLN
INTRAMUSCULAR | Status: AC
  Filled 2013-05-28: qty 1
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
## 2013-05-28 NOTE — Op Note (Signed)
[facility] [place], [address]   OPERATIVE PROCEDURE REPORT  PATIENT: Nicolas, Eisenhauer.  MR#: [id number] BIRTHDATE: 31-Aug-1929  GENDER: Female ENDOSCOPIST: Carol Ada, MD ASSISTANT:   Ward Chatters, RN, BSN and Remigio Eisenmenger, technician PROCEDURE DATE: 05/28/2013 PROCEDURE:   EGD w/ ablation ASA CLASS:   Class III INDICATIONS:Anemia, Heme positive stool, and history of bleeding AVM. MEDICATIONS: Versed 5 mg IV, Fentanyl 50 mcg IV, and Glucagon 1 mg IV TOPICAL ANESTHETIC:   none  DESCRIPTION OF PROCEDURE:   After the risks benefits and alternatives of the procedure were thoroughly explained, informed consent was obtained.  The Pentax Gastroscope [id number]  endoscope was introduced through the mouth  and advanced to the third portion of the duodenum Without limitations.      The instrument was slowly withdrawn as the mucosa was fully examined.      FINDINGS: The esophagus was normal.  Entry into the gastric lumen revealed two intact hemoclips from the prior AVM ablation.  No evidence of any active bleeding or old blood in the stomach.  In the duodenum the endoscope was able to be advanced into the third portion of the duodenum and multiple nonbleeding AVMs were identified.  APC was used to ablate all identified AVMs. Retroflexed views revealed no abnormalities.     The scope was then withdrawn from the patient and the procedure terminated.  COMPLICATIONS: There were no complications. IMPRESSION: 1) Multiple nonbleeding small bowel AVMs.  RECOMMENDATIONS: 1) Follow up in the office in two weeks.  _______________________________ eSignedCarol Ada, MD 05/28/2013 1:06 PM

## 2013-05-28 NOTE — Interval H&P Note (Signed)
History and Physical Interval Note:  05/28/2013 11:38 AM  Gina [name]  has presented today for surgery, with the diagnosis of AVMs  The various methods of treatment have been discussed with the patient and family. After consideration of risks, benefits and other options for treatment, the patient has consented to  Procedure(s): ESOPHAGOGASTRODUODENOSCOPY (EGD) (N/A) HOT HEMOSTASIS (ARGON PLASMA COAGULATION/BICAP) (N/A) as a surgical intervention .  The patient's history has been reviewed, patient examined, no change in status, stable for surgery.  I have reviewed the patient's chart and labs.  Questions were answered to the patient's satisfaction.     Abigal Choung D

## 2013-05-31 ENCOUNTER — Encounter (HOSPITAL_COMMUNITY): Payer: Self-pay | Admitting: Gastroenterology

## 2013-06-21 ENCOUNTER — Ambulatory Visit: Payer: Medicare Other | Admitting: Internal Medicine

## 2013-08-05 ENCOUNTER — Other Ambulatory Visit: Payer: Self-pay

## 2013-08-18 ENCOUNTER — Encounter: Payer: Medicare Other | Admitting: Dietician

## 2013-08-18 ENCOUNTER — Ambulatory Visit: Payer: Medicare Other | Admitting: Internal Medicine

## 2013-08-24 ENCOUNTER — Ambulatory Visit: Payer: Medicare Other | Admitting: Internal Medicine

## 2013-08-24 ENCOUNTER — Encounter: Payer: Self-pay | Admitting: Internal Medicine

## 2013-09-03 ENCOUNTER — Encounter: Payer: Self-pay | Admitting: Internal Medicine

## 2013-09-03 ENCOUNTER — Ambulatory Visit (INDEPENDENT_AMBULATORY_CARE_PROVIDER_SITE_OTHER): Payer: Medicare Other | Admitting: Internal Medicine

## 2013-09-03 VITALS — BP 112/58 | HR 87 | Temp 97.7°F | Resp 10 | Ht 66.0 in | Wt 85.3 lb

## 2013-09-03 DIAGNOSIS — E0500 Thyrotoxicosis with diffuse goiter without thyrotoxic crisis or storm: Secondary | ICD-10-CM | POA: Insufficient documentation

## 2013-09-03 DIAGNOSIS — E0590 Thyrotoxicosis, unspecified without thyrotoxic crisis or storm: Secondary | ICD-10-CM
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
## 2013-09-03 NOTE — Patient Instructions (Signed)
Please return in 2.5 months. We will call you ASAP with results and further plan.

## 2013-09-06 ENCOUNTER — Other Ambulatory Visit: Payer: Self-pay | Admitting: *Deleted

## 2013-09-06 ENCOUNTER — Other Ambulatory Visit (INDEPENDENT_AMBULATORY_CARE_PROVIDER_SITE_OTHER): Payer: Medicare Other

## 2013-09-06 DIAGNOSIS — E041 Nontoxic single thyroid nodule: Secondary | ICD-10-CM

## 2013-09-06 LAB — TSH: TSH: 0 u[IU]/mL — ABNORMAL LOW (ref 0.35–5.50)

## 2013-09-06 LAB — T4, FREE: Free T4: 3.32 ng/dL — ABNORMAL HIGH (ref 0.60–1.60)

## 2013-09-07 ENCOUNTER — Encounter: Payer: Self-pay | Admitting: *Deleted

## 2013-09-07 LAB — T3: T3, Total: 187.2 ng/dL (ref 80.0–204.0)

## 2013-09-07 MED ORDER — METHIMAZOLE 5 MG PO TABS: 5.0000 mg | ORAL_TABLET | Freq: Two times a day (BID) | ORAL | Status: DC

## 2013-09-09 ENCOUNTER — Telehealth: Payer: Self-pay

## 2013-09-09 NOTE — Telephone Encounter (Signed)
Gina [name], can you please have her check with PCP, too. From her hyperthyroidism point of view, she can take Methimazole when on Zofran.

## 2013-09-09 NOTE — Telephone Encounter (Signed)
Patient called stating that she just recently picked up a prescription for Zofran. Patient stated that she had received a sheet stating that no other medications should be taken while on this. Patient wanted to know Dr opinion if taking her Blood pressure pill and protonix was ok to do.  Please advise,  Thanks!

## 2013-09-09 NOTE — Telephone Encounter (Signed)
Patient informed. 

## 2013-10-01 ENCOUNTER — Telehealth: Payer: Self-pay | Admitting: *Deleted

## 2013-10-01 NOTE — Telephone Encounter (Signed)
Gina [name] at [facility] called stating the pt has not had a pre-cert done on her upcoming Thyroid uptake and scan. Can you please check on this? Thank you.

## 2013-10-04 ENCOUNTER — Other Ambulatory Visit: Payer: Self-pay | Admitting: *Deleted

## 2013-10-04 ENCOUNTER — Telehealth: Payer: Self-pay | Admitting: *Deleted

## 2013-10-04 ENCOUNTER — Encounter (HOSPITAL_COMMUNITY)
Admission: RE | Admit: 2013-10-04 | Discharge: 2013-10-04 | Disposition: A | Discharge status: Home / Self Care | Payer: PRIVATE HEALTH INSURANCE | Source: Ambulatory Visit | Attending: Internal Medicine | Admitting: Internal Medicine

## 2013-10-04 DIAGNOSIS — E0590 Thyrotoxicosis, unspecified without thyrotoxic crisis or storm: Secondary | ICD-10-CM | POA: Insufficient documentation

## 2013-10-04 DIAGNOSIS — E041 Nontoxic single thyroid nodule: Secondary | ICD-10-CM

## 2013-10-04 NOTE — Telephone Encounter (Signed)
Called pt and advised her to start back the MMI (methimazole) 5 mg bid(twice daily) AFTER her scan. Also, advised her to come back for labs - TSH, free T4 and free T3 on Monday. Pt understood. Labs ordered.

## 2013-10-04 NOTE — Telephone Encounter (Signed)
Message copied by Lucius Conn on Mon Oct 04, 2013 11:59 AM ------      Message from: Philemon Kingdom      Created: Mon Oct 04, 2013 11:28 AM       Larene Beach,      Ms Zuchowski has her Uptake and Scan today and tomorrow - can you call her and advise her to start back the MMI 5 mg bid AFTER her scan. Also, please advise her to come back for labs - TSH, free T4 and free T3 on Monday - can you please order this?      Thank you so much,      C ------

## 2013-10-05 ENCOUNTER — Encounter (HOSPITAL_COMMUNITY)
Admission: RE | Admit: 2013-10-05 | Discharge: 2013-10-05 | Disposition: A | Discharge status: Home / Self Care | Payer: PRIVATE HEALTH INSURANCE | Source: Ambulatory Visit | Attending: Internal Medicine | Admitting: Internal Medicine

## 2013-10-05 MED ORDER — SODIUM PERTECHNETATE TC 99M INJECTION
10.0000 | Freq: Once | INTRAVENOUS | Status: AC | PRN
  Administered 2013-10-05: 10 via INTRAVENOUS

## 2013-10-05 MED ORDER — SODIUM IODIDE I 131 CAPSULE
12.0000 | Freq: Once | INTRAVENOUS | Status: AC | PRN
  Administered 2013-10-05: 12 via ORAL

## 2013-10-14 ENCOUNTER — Encounter: Payer: Self-pay | Admitting: Internal Medicine

## 2013-10-15 ENCOUNTER — Other Ambulatory Visit (INDEPENDENT_AMBULATORY_CARE_PROVIDER_SITE_OTHER): Payer: Medicare Other

## 2013-10-15 DIAGNOSIS — E041 Nontoxic single thyroid nodule: Secondary | ICD-10-CM

## 2013-10-15 LAB — T4, FREE: Free T4: 1.05 ng/dL (ref 0.60–1.60)

## 2013-10-15 LAB — T3, FREE: T3, Free: 1.9 pg/mL — ABNORMAL LOW (ref 2.3–4.2)

## 2013-10-15 LAB — TSH: TSH: 0.03 u[IU]/mL — ABNORMAL LOW (ref 0.35–5.50)

## 2013-10-22 ENCOUNTER — Other Ambulatory Visit: Payer: Self-pay | Admitting: Internal Medicine

## 2013-11-05 ENCOUNTER — Ambulatory Visit (INDEPENDENT_AMBULATORY_CARE_PROVIDER_SITE_OTHER): Payer: Medicare Other | Admitting: Internal Medicine

## 2013-11-05 ENCOUNTER — Encounter: Payer: Self-pay | Admitting: Internal Medicine

## 2013-11-05 VITALS — BP 148/60 | HR 89 | Temp 98.3°F | Resp 14 | Ht 67.0 in | Wt 92.2 lb

## 2013-11-05 DIAGNOSIS — E041 Nontoxic single thyroid nodule: Secondary | ICD-10-CM

## 2013-11-05 DIAGNOSIS — E0590 Thyrotoxicosis, unspecified without thyrotoxic crisis or storm: Secondary | ICD-10-CM

## 2013-11-05 NOTE — Progress Notes (Signed)
[name], female   DOB: 30-Jun-1929, 80 y.o.   MRN: [id number]   HPI  Gina [name] is a 80 y.o.-year-old female, initially referred by her PCP, Dr. 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 Last visit 2 mo ago.  Reviewed hx: She has had a goiter since 80 y/o, however she did not have thyroid hormone dysfunction until recently. She describes onset of left-sided AP this summer >> referred to Dr Gina [name] >> he checked TFTs and they revealed hyperthyroidism. She continues to have AP issues other investigations for this. An EGD performed on 05/07/2013 showed 2 gastric AVMs (s/p APC) and a nonbleeding healing antral ulcer, per records from Dr. Benson [name]. A CT of the abdomen and pelvis did not show an evident cause for her pain. Previous colonoscopy in 2010 was normal.  I reviewed pt's thyroid tests: Lab Results  Component Value Date   TSH 0.03* 10/15/2013   TSH 0.00* 09/06/2013   TSH <0.008* 05/06/2013   TSH 0.012* 01/28/2013   FREET4 1.05 10/15/2013   FREET4 3.32* 09/06/2013   FREET4 3.60* 05/06/2013   FREET4 1.51 01/28/2013    An uptake and scan showed uniform uptake within the thyroid gland and no nodules - 24 hour I 131 uptake = 32.2% (normal 10-30%), c/w mild Graves disease.  At last visit we started MMI 5 mg bid >> TFTs improved per last check 3 weeks ago. She also mentions that her AP is better. She gained 7 lbs since last visit. She also feels her swallowing is better.  Component     Latest Ref Rng 09/06/2013 10/15/2013  TSH     0.35 - 5.50 uIU/mL 0.00 (L) 0.03 (L)  Free T4     0.60 - 1.60 ng/dL 3.32 (H) 1.05  T3, Total     80.0 - 204.0 ng/dL 187.2   T3, Free     2.3 - 4.2 pg/mL  1.9 (L)   TSH was still low, but this lags behind improvement in the rest of the tests. We continued current Methimazole regimen  Based on her latest free T4 and free T3 she is cleared for stent placement surgery from her thyroid point of view.  Pt denies nodules in neck, no hoarseness, no  dysphagia/no odynophagia.  A thyroid U/S showed several possible smaller nodules, not clearly defined.  She c/o: - + severe weight loss 136 lbs in 02/2013 >> 85 lbs at last visit >> 92 lbs now - no excessive sweating/heat intolerance; she has cold intolerance - no more tremors - no anxiety - no palpitations - no fatigue - no hyperdefecation; + constipation - + weakness  I reviewed her chart and she also has a history of DM 2, CAD - she apparently had an MI in 01/2013. A 2-D echo showed an EF of 60% in 2009., PVD, HL, HTN, emphysema, DDD, protein-caloric malnutrition.  ROS: Constitutional: see history of present illness Eyes: no blurry vision, no xerophthalmia ENT: see history of present illness Cardiovascular: no CP/+ SOB/no palpitations/leg swelling Respiratory: no cough/+ SOB Gastrointestinal: no N/V/D/C Musculoskeletal: + muscle aches/no joint aches Skin: no rashes Neurological: no tremors/numbness/tingling/dizziness  PE: BP 148/60  Pulse 89  Temp(Src) 98.3 F (36.8 C)  Resp 14  Ht 5\' 7"  (1.702 m)  Wt 92 lb 3.2 oz (41.822 kg)  BMI 14.44 kg/m2  SpO2 97% Wt Readings from Last 3 Encounters:  11/05/13 92 lb 3.2 oz (41.822 kg)  09/03/13 85 lb 4.8 oz (38.692 kg)  05/21/13 106  lb (48.081 kg)   Constitutional: overweight, in NAD, cachectic appearance, temporal wasting Eyes: PERRLA, EOMI, no exophthalmos, no lid lag, no stare ENT: moist mucous membranes,  lumpy bumpy thyroid, bony prominence in the left side of the lower neck, anteriorly, no thyroid bruits, no cervical lymphadenopathy Cardiovascular: RRR, No MRG Respiratory: CTA B Gastrointestinal: abdomen soft, NT, ND, BS+ Musculoskeletal: no deformities, strength intact in all 4 Skin: moist, warm, no rashes Neurological: mild tremor with outstretched hands, DTR normal in all 4  ASSESSMENT: 1. Graves ds.  - Thyroid uptake and scan (10/05/2013): There is uniform uptake within the thyroid gland which is mildly   enlarged. No nodularity. 24 hour I 131 uptake = 32.2% (normal 10-30%)  IMPRESSION: Thyroid imaging and uptake suggests mild Graves disease   - thyroid ultrasound (03/17/2013):  Right thyroid lobe: 5.0 x 2.7 x 2.0 cm   Left thyroid lobe: 4.7 x 2.7 x 1.5 cm   Isthmus: 7.0 mm in thickness in the midline   Focal nodules: The thyroid gland is diffusely heterogeneous. A 1.2 x 1.1 x 1.0 cm rounded, heterogeneous, predominately solid nodule in the mid right lobe, laterally. Suggestion of a 1.5 x 1.3 cm nodule in the mid to inferior left lobe at the level of the isthmus in the transverse plane, not visualized as a discrete nodule in the sagittal plane. Suggestion of a 1.7 x 1.5 x 0.7 cm oval, heterogeneous, predominately solid nodule in the isthmus centered to the right of midline. A 5 mm dense calcification in the isthmus slightly more inferiorly on the right without a well-defined mass separate from the calcification on the images.   Lymphadenopathy: None visualized.  IMPRESSION:  Multinodular goiter, as described above. The only definite discrete, confirmed nodule is in the lateral aspect of the mid right lobe, measuring 1.2 cm in maximum diameter.  - DEXA scan (07/08/2012): Normal  - lumbar spine x-ray (05/06/2013): compression fracture in the superior L1 endplate and 579FGE anterior height loss  PLAN:  1. Patient with mild Graves ds, with  thyrotoxic sxs: severe weight loss, tremors, now improved on MMI - pt feeling much better on MMI, and also she gained weight >> continue current management - Patient is on beta blockers (Lopressor) and she does not appear to be tachycardic, anxious, or tremulous.  - return in 2 weeks for labs - she has an active my chart but she would like to be called about the results - RTC in 3 mo

## 2013-11-05 NOTE — Patient Instructions (Signed)
Please come back in 2 weeks for labs and in 3 months for a visit.

## 2013-11-19 ENCOUNTER — Other Ambulatory Visit: Payer: Medicare Other

## 2013-11-22 ENCOUNTER — Other Ambulatory Visit (INDEPENDENT_AMBULATORY_CARE_PROVIDER_SITE_OTHER): Payer: Medicare Other

## 2013-11-22 DIAGNOSIS — E0590 Thyrotoxicosis, unspecified without thyrotoxic crisis or storm: Secondary | ICD-10-CM

## 2013-11-22 LAB — T3, FREE: T3, Free: 1.9 pg/mL — ABNORMAL LOW (ref 2.3–4.2)

## 2013-11-22 LAB — TSH: TSH: 0.29 u[IU]/mL — ABNORMAL LOW (ref 0.35–5.50)

## 2013-11-22 LAB — T4, FREE: Free T4: 0.69 ng/dL (ref 0.60–1.60)

## 2013-11-23 ENCOUNTER — Encounter: Payer: Self-pay | Admitting: Internal Medicine

## 2013-11-23 ENCOUNTER — Other Ambulatory Visit: Payer: Self-pay | Admitting: Internal Medicine

## 2013-11-23 DIAGNOSIS — E0500 Thyrotoxicosis with diffuse goiter without thyrotoxic crisis or storm: Secondary | ICD-10-CM

## 2013-11-24 ENCOUNTER — Telehealth: Payer: Self-pay | Admitting: *Deleted

## 2013-11-24 NOTE — Telephone Encounter (Signed)
Opened encounter in error  

## 2014-01-06 ENCOUNTER — Other Ambulatory Visit: Payer: Self-pay

## 2014-01-25 ENCOUNTER — Observation Stay (HOSPITAL_COMMUNITY)
Admission: AD | Admit: 2014-01-25 | Discharge: 2014-01-26 | Disposition: A | Discharge status: Home Health | Payer: PRIVATE HEALTH INSURANCE | Source: Ambulatory Visit | Attending: Internal Medicine | Admitting: Internal Medicine

## 2014-01-25 ENCOUNTER — Encounter: Payer: Self-pay | Admitting: Internal Medicine

## 2014-01-25 ENCOUNTER — Ambulatory Visit (INDEPENDENT_AMBULATORY_CARE_PROVIDER_SITE_OTHER): Payer: PRIVATE HEALTH INSURANCE | Admitting: Internal Medicine

## 2014-01-25 ENCOUNTER — Telehealth: Payer: Self-pay | Admitting: *Deleted

## 2014-01-25 ENCOUNTER — Encounter (HOSPITAL_COMMUNITY): Payer: Self-pay | Admitting: General Practice

## 2014-01-25 VITALS — BP 108/52 | HR 78 | Temp 96.8°F | Wt 82.5 lb

## 2014-01-25 DIAGNOSIS — I700 Atherosclerosis of aorta: Secondary | ICD-10-CM | POA: Insufficient documentation

## 2014-01-25 DIAGNOSIS — E119 Type 2 diabetes mellitus without complications: Secondary | ICD-10-CM

## 2014-01-25 DIAGNOSIS — R109 Unspecified abdominal pain: Secondary | ICD-10-CM

## 2014-01-25 DIAGNOSIS — I739 Peripheral vascular disease, unspecified: Secondary | ICD-10-CM | POA: Insufficient documentation

## 2014-01-25 DIAGNOSIS — Z853 Personal history of malignant neoplasm of breast: Secondary | ICD-10-CM | POA: Insufficient documentation

## 2014-01-25 DIAGNOSIS — E0500 Thyrotoxicosis with diffuse goiter without thyrotoxic crisis or storm: Secondary | ICD-10-CM | POA: Insufficient documentation

## 2014-01-25 DIAGNOSIS — K259 Gastric ulcer, unspecified as acute or chronic, without hemorrhage or perforation: Secondary | ICD-10-CM | POA: Insufficient documentation

## 2014-01-25 DIAGNOSIS — R634 Abnormal weight loss: Secondary | ICD-10-CM | POA: Insufficient documentation

## 2014-01-25 DIAGNOSIS — Z720 Tobacco use: Secondary | ICD-10-CM

## 2014-01-25 DIAGNOSIS — Q279 Congenital malformation of peripheral vascular system, unspecified: Secondary | ICD-10-CM | POA: Insufficient documentation

## 2014-01-25 DIAGNOSIS — E43 Unspecified severe protein-calorie malnutrition: Secondary | ICD-10-CM | POA: Insufficient documentation

## 2014-01-25 DIAGNOSIS — R0989 Other specified symptoms and signs involving the circulatory and respiratory systems: Secondary | ICD-10-CM | POA: Insufficient documentation

## 2014-01-25 DIAGNOSIS — I10 Essential (primary) hypertension: Secondary | ICD-10-CM | POA: Insufficient documentation

## 2014-01-25 DIAGNOSIS — F17200 Nicotine dependence, unspecified, uncomplicated: Secondary | ICD-10-CM | POA: Insufficient documentation

## 2014-01-25 DIAGNOSIS — K551 Chronic vascular disorders of intestine: Secondary | ICD-10-CM | POA: Diagnosis present

## 2014-01-25 DIAGNOSIS — Z681 Body mass index (BMI) 19 or less, adult: Secondary | ICD-10-CM | POA: Insufficient documentation

## 2014-01-25 DIAGNOSIS — K5900 Constipation, unspecified: Secondary | ICD-10-CM | POA: Insufficient documentation

## 2014-01-25 DIAGNOSIS — Z9010 Acquired absence of unspecified breast and nipple: Secondary | ICD-10-CM | POA: Insufficient documentation

## 2014-01-25 DIAGNOSIS — E785 Hyperlipidemia, unspecified: Secondary | ICD-10-CM | POA: Insufficient documentation

## 2014-01-25 HISTORY — DX: Thyrotoxicosis with diffuse goiter without thyrotoxic crisis or storm: E05.00

## 2014-01-25 LAB — CBC WITH DIFFERENTIAL/PLATELET
Basophils Absolute: 0 10*3/uL (ref 0.0–0.1)
Basophils Relative: 0 % (ref 0–1)
Eosinophils Absolute: 0 10*3/uL (ref 0.0–0.7)
Eosinophils Relative: 1 % (ref 0–5)
HCT: 34.2 % — ABNORMAL LOW (ref 36.0–46.0)
Hemoglobin: 11.9 g/dL — ABNORMAL LOW (ref 12.0–15.0)
Lymphocytes Relative: 33 % (ref 12–46)
Lymphs Abs: 1.6 10*3/uL (ref 0.7–4.0)
MCH: 34.3 pg — ABNORMAL HIGH (ref 26.0–34.0)
MCHC: 34.8 g/dL (ref 30.0–36.0)
MCV: 98.6 fL (ref 78.0–100.0)
Monocytes Absolute: 0.4 10*3/uL (ref 0.1–1.0)
Monocytes Relative: 9 % (ref 3–12)
Neutro Abs: 2.7 10*3/uL (ref 1.7–7.7)
Neutrophils Relative %: 57 % (ref 43–77)
Platelets: 185 10*3/uL (ref 150–400)
RBC: 3.47 MIL/uL — ABNORMAL LOW (ref 3.87–5.11)
RDW: 12.2 % (ref 11.5–15.5)
WBC: 4.7 10*3/uL (ref 4.0–10.5)

## 2014-01-25 LAB — POCT URINALYSIS DIPSTICK
Blood, UA: NEGATIVE
Glucose, UA: NEGATIVE
Ketones, UA: NEGATIVE
Leukocytes, UA: NEGATIVE
Nitrite, UA: NEGATIVE
Protein, UA: 30
Spec Grav, UA: 1.025
Urobilinogen, UA: 1
pH, UA: 5

## 2014-01-25 LAB — COMPREHENSIVE METABOLIC PANEL
ALT: 10 U/L (ref 0–35)
AST: 12 U/L (ref 0–37)
Albumin: 3.8 g/dL (ref 3.5–5.2)
Alkaline Phosphatase: 58 U/L (ref 39–117)
BUN: 28 mg/dL — ABNORMAL HIGH (ref 6–23)
CO2: 30 mEq/L (ref 19–32)
Calcium: 9.7 mg/dL (ref 8.4–10.5)
Chloride: 101 mEq/L (ref 96–112)
Creat: 0.68 mg/dL (ref 0.50–1.10)
Glucose, Bld: 123 mg/dL — ABNORMAL HIGH (ref 70–99)
Potassium: 3.8 mEq/L (ref 3.5–5.3)
Sodium: 143 mEq/L (ref 135–145)
Total Bilirubin: 0.4 mg/dL (ref 0.3–1.2)
Total Protein: 7.1 g/dL (ref 6.0–8.3)

## 2014-01-25 LAB — GLUCOSE, CAPILLARY
Glucose-Capillary: 126 mg/dL — ABNORMAL HIGH (ref 70–99)
Glucose-Capillary: 212 mg/dL — ABNORMAL HIGH (ref 70–99)

## 2014-01-25 LAB — T4, FREE: Free T4: 2.91 ng/dL — ABNORMAL HIGH (ref 0.80–1.80)

## 2014-01-25 LAB — TSH: TSH: 0.014 u[IU]/mL — ABNORMAL LOW (ref 0.350–4.500)

## 2014-01-25 LAB — LACTIC ACID, PLASMA: LACTIC ACID: 1.4 mmol/L (ref 0.5–2.2)

## 2014-01-25 LAB — POCT GLYCOSYLATED HEMOGLOBIN (HGB A1C): Hemoglobin A1C: 6

## 2014-01-25 MED ORDER — PANTOPRAZOLE SODIUM 40 MG PO TBEC
40.0000 mg | DELAYED_RELEASE_TABLET | Freq: Every day | ORAL | Status: DC
  Administered 2014-01-25 – 2014-01-26 (×2): 40 mg via ORAL
  Filled 2014-01-25 (×2): qty 1

## 2014-01-25 MED ORDER — SODIUM CHLORIDE 0.9 % IV SOLN
INTRAVENOUS | Status: DC
  Administered 2014-01-25 – 2014-01-26 (×2): via INTRAVENOUS

## 2014-01-25 MED ORDER — POLYETHYLENE GLYCOL 3350 17 G PO PACK
17.0000 g | PACK | Freq: Every day | ORAL | Status: DC
  Administered 2014-01-25 – 2014-01-26 (×2): 17 g via ORAL
  Filled 2014-01-25 (×2): qty 1

## 2014-01-25 MED ORDER — LATANOPROST 0.005 % OP SOLN
1.0000 [drp] | Freq: Every day | OPHTHALMIC | Status: DC
  Administered 2014-01-25: 1 [drp] via OPHTHALMIC
  Filled 2014-01-25: qty 2.5

## 2014-01-25 MED ORDER — PROMETHAZINE HCL 25 MG PO TABS: 12.5000 mg | ORAL_TABLET | Freq: Four times a day (QID) | ORAL | Status: DC | PRN

## 2014-01-25 MED ORDER — ENOXAPARIN SODIUM 40 MG/0.4ML ~~LOC~~ SOLN
40.0000 mg | SUBCUTANEOUS | Status: DC
  Filled 2014-01-25: qty 0.4

## 2014-01-25 MED ORDER — SODIUM CHLORIDE 0.9 % IJ SOLN
3.0000 mL | Freq: Two times a day (BID) | INTRAMUSCULAR | Status: DC
  Administered 2014-01-25: 3 mL via INTRAVENOUS

## 2014-01-25 MED ORDER — ENOXAPARIN SODIUM 30 MG/0.3ML ~~LOC~~ SOLN
20.0000 mg | SUBCUTANEOUS | Status: DC
  Administered 2014-01-25: 20 mg via SUBCUTANEOUS
  Filled 2014-01-25 (×2): qty 0.2

## 2014-01-25 MED ORDER — MORPHINE SULFATE 2 MG/ML IJ SOLN: 1.0000 mg | INTRAMUSCULAR | Status: DC | PRN

## 2014-01-25 MED ORDER — DORZOLAMIDE HCL-TIMOLOL MAL 2-0.5 % OP SOLN
1.0000 [drp] | Freq: Every day | OPHTHALMIC | Status: DC
  Administered 2014-01-25 – 2014-01-26 (×2): 1 [drp] via OPHTHALMIC
  Filled 2014-01-25: qty 10

## 2014-01-25 MED ORDER — KETOROLAC TROMETHAMINE 0.5 % OP SOLN
1.0000 [drp] | Freq: Two times a day (BID) | OPHTHALMIC | Status: DC
  Administered 2014-01-25 – 2014-01-26 (×2): 1 [drp] via OPHTHALMIC
  Filled 2014-01-25: qty 3

## 2014-01-25 NOTE — Assessment & Plan Note (Signed)
  Assessment: Progress toward smoking cessation:  smoking less Barriers to progress toward smoking cessation:    Comments: still smoking daily, not ready to quit but has cut down  Plan: Instruction/counseling given:  I counseled patient on the dangers of tobacco use, advised patient to stop smoking, and reviewed strategies to maximize success. Educational resources provided:    Self management tools provided:    Medications to assist with smoking cessation:  None Patient agreed to the following self-care plans for smoking cessation: call [facility] (1-800-QUIT-NOW)

## 2014-01-25 NOTE — Telephone Encounter (Signed)
Call from CNA that works with pt stating pt is in a lot of pain, constant since yesterday. Pain is  on left side abd.  No BM since Saturday.  She has taken stool softeners with no results.  Usually Stool softeners work for her. Some nausea, no vomiting.  Still eating normally.  Will see today.

## 2014-01-25 NOTE — Progress Notes (Signed)
Attempted to start NSL - unsuccessful. Report call to [facility] in 5W; pt will be going to room 11 then to room 16 which is a camera room.

## 2014-01-25 NOTE — Assessment & Plan Note (Signed)
Has reportedly not taken methimazole for greater than 1 months. Was previously on methimazole 5mg  bid, then she claims it was down to daily but she did not get it refilled when the prescription ran out and has also not followed up with endocrine recently.   -is apparently on lopressor 25mg  bid but did not have the bottle with her and she does not recall if she has been taking it -need to discuss with Dr. Cruzita Lederer on recent medications  -repeat TSH and fT4 for now

## 2014-01-25 NOTE — Progress Notes (Signed)
NURSING PROGRESS NOTE  TOMIA SPINNEY [id number] Admission Data: 01/25/2014 9:55 PM Attending Provider: Dominic Pea, DO [id number], [place], MD Code Status: FULL  JOLEIGH RADEBAUGH is a 80 y.o. female patient admitted from outpatient:  -No acute distress noted.  -No complaints of shortness of breath.  -No complaints of chest pain.   Cardiac Monitoring: Box # 1 in place. Cardiac monitor yields: Telemetry strip unavailable at this time  Blood pressure 165/71, pulse 63, temperature 97.7 F (36.5 C), temperature source Oral, resp. rate 18, height 5\' 6"  (1.676 m), weight 37.423 kg (82 lb 8 oz), SpO2 100.00%.   IV Fluids:  Patient admitted with no IV access. IV team placed IV in right AC. No phlebitis or infiltration noted to site.  Allergies:  Metformin and related and Simvastatin  Past Medical History:   has a past medical history of Intraductal carcinoma (06/2003); PVD (peripheral vascular disease); Diabetes mellitus, type 2; Hypertension; Hyperlipidemia; TB lung, latent; Arthritis; Thyroid disease; Ulcer; Mesenteric ischemia; and Graves disease.  Past Surgical History:   has past surgical history that includes Femoral-popliteal Bypass Graft (03/2001); Femoral-popliteal Bypass Graft (01/2003); Mastectomy, partial (01/2004); Ankle surgery (1975); Total abdominal hysterectomy w/ bilateral salpingoophorectomy (1975); Abdominal hysterectomy; Pars plana vitrectomy (12/25/2011); Esophagogastroduodenoscopy (N/A, 05/07/2013); Esophagogastroduodenoscopy (N/A, 05/28/2013); Hot hemostasis (N/A, 05/28/2013); Breast surgery; and Eye surgery.   Skin: Bruises noted to bilateral AC and left wrist. Patient reports that it is from venipuncture.   Patient orientated to room. Information packet given to patient. Admission inpatient armband information verified with patient to include name and date of birth and placed on patient arm. Side rails up x 2, fall assessment and education completed with patient.  Patient able to verbalize understanding of risk associated with falls and verbalized understanding to call for assistance before getting out of bed. Patient refused to watch patient safety video. Call light within reach. Patient able to voice and demonstrate understanding of unit orientation instructions.    Will continue to evaluate and treat per MD orders.  Wallie Renshaw, RN

## 2014-01-25 NOTE — Telephone Encounter (Signed)
Agree with recommendations outlined in [facility] RN note. Pt to be seen today

## 2014-01-25 NOTE — Progress Notes (Signed)
ANTICOAGULATION CONSULT NOTE - Initial Consult  Pharmacy Consult for Lovenox Indication: VTE prophylaxis  Allergies  Allergen Reactions  . Metformin And Related Nausea Only    And diarrhea  . Simvastatin Other (See Comments)    Dizziness. Well tolerates pravastatin.    Patient Measurements: Weight: 82 lb 8 oz (37.423 kg) Heparin Dosing Weight:    Vital Signs: Temp: 97.7 F (36.5 C) (04/28 1739) Temp src: Oral (04/28 1739) BP: 133/69 mmHg (04/28 1739) Pulse Rate: 70 (04/28 1739)  Labs:  Recent Labs  01/25/14 1453  HGB 11.9*  HCT 34.2*  PLT 185  CREATININE 0.68    The CrCl is unknown because both a height and weight (above a minimum accepted value) are required for this calculation.   Medical History: Past Medical History  Diagnosis Date  . Intraductal carcinoma 06/2003    Of right breast. s/p right partial mastectomy. // Followed by Dr. Marylene Buerger  . PVD (peripheral vascular disease)     S/P BL femoral-popliteal bypass surgery 03/2001 (right) and 01/2003 (left)  . Diabetes mellitus, type 2     Well controlled  . Hypertension   . Hyperlipidemia   . TB lung, latent     Treated with INH in 11/2007  . Arthritis     lt hip  . Thyroid disease     Graves disease  . Ulcer     gastric antral ulcer and AVMs  . Mesenteric ischemia   . Graves disease     HX OF GRAVES    Medications:  Prescriptions prior to admission  Medication Sig Dispense Refill  . pantoprazole (PROTONIX) 40 MG tablet Take 40 mg by mouth daily.      Marland Kitchen amLODipine-valsartan (EXFORGE) 10-160 MG per tablet Take 1 tablet by mouth daily.      Marland Kitchen atorvastatin (LIPITOR) 10 MG tablet       . chlorthalidone (HYGROTON) 25 MG tablet Take 25 mg by mouth daily.      Marland Kitchen docusate sodium 100 MG CAPS Take 100 mg by mouth daily.  30 capsule  1  . dorzolamide-timolol (COSOPT) 22.3-6.8 MG/ML ophthalmic solution Place 1 drop into both eyes daily.       Marland Kitchen gentamicin (GARAMYCIN) 0.3 % ophthalmic solution Place 1  drop into the right eye 4 (four) times daily.      Marland Kitchen ketorolac (ACULAR) 0.5 % ophthalmic solution Place 1 drop into the right eye 2 (two) times daily.      Marland Kitchen latanoprost (XALATAN) 0.005 % ophthalmic solution Place 1 drop into both eyes at bedtime.      Marland Kitchen lisinopril (PRINIVIL,ZESTRIL) 40 MG tablet Take 40 mg by mouth daily.      . methimazole (TAPAZOLE) 5 MG tablet TAKE 1 TABLET BY MOUTH TWICE DAILY  60 tablet  0  . metoprolol tartrate (LOPRESSOR) 25 MG tablet Take 1 tablet (25 mg total) by mouth 2 (two) times daily.  30 tablet  3  . ofloxacin (OCUFLOX) 0.3 % ophthalmic solution Place 1 drop into the right eye 4 (four) times daily.      . ondansetron (ZOFRAN) 4 MG tablet       . pilocarpine (PILOCAR) 1 % ophthalmic solution       . sucralfate (CARAFATE) 1 G tablet Take 1 tablet (1 g total) by mouth 4 (four) times daily -  with meals and at bedtime.  120 tablet  5    Assessment: 80 y/o AAF admitted with LLQ abdominal pain. She has significant GI  history including PVD, DM2, HTN, Graves disease (on methimazole), known abdominal bruit w/ known chronic mesenteric ischemia/abd angina (for the last 1-1.69yrs per Dr. Nadyne Coombes), antral ulcer and gastric AVMs, and R breast cancer s/p mastectomy.    Anticoagulation: pharmacy consult to dose LMWH for DVT prophylaxis. She has had decreased PO intake recently and weight loss (down about 10 lbs since 10/2013). Pt denies N/V/D or bloody stools.   Labs: Scr 0.68, Hgb only 11.9, Plts 185. Weight 37.4 kg CrCl likely < 30 based on age and low weight/muscle mass   Goal of Therapy:  DVT prophylaxis Monitor platelets by anticoagulation protocol: Yes   Plan:  Lovenox 20mg  (0.5mg /kg) sq once daily.  Leinaala Catanese S. Alford Highland, PharmD, [facility] Clinical Staff Pharmacist Pager [phone]  Coralyn Pear Alford Highland 01/25/2014,6:42 PM

## 2014-01-25 NOTE — Progress Notes (Signed)
Subjective:   [name] female   DOB: Jul 15, 1929 80 y.o.   MRN: [id number]  HPI: [name] is a 80 y.o. African American female with PMH of PVD, DM2, HTN, Graves disease (on methimazole), antral ulcer and gastric AVMs, and R breast cancer s/p mastectomy presenting to [facility] today for acute visit with complaints of left sided abdominal pain and constipation. She reports persistent severe left sided abdominal pain radiating to across to midback x2 months, 10/10, worse when lying down and with movement, associated with decreased appetite.  She also has not had  BM since Saturday despite laxatives (dulcolax).  She has lost weight again, down to 82.5lb's from 92.2lb's in February of this year and has not taken any methimazole since March 2015, ever since she ran out.  Of note, she was noted to be taking 5mg  bid, however says the dose was reduced to daily maybe in February. She has been following with Dr. Benson Norway and Dr. Cruzita Lederer along with Dr. Einar Gip apparently but is a poor historian and has not followed up with them for several months. She says she has had abdominal pain in the past but it was more in her stomach she claims and not left sided and it was fixed after a "procedure" (although Dr. Benson Norway noted on the phone that it does not seem that stenting was done). On chart review, there is mention of mesenteric ischemia with ?stenting but unclear when this happened and with who. She denies dysuria, nausea, or vomiting. No chest pain, dysuria, fever, or chills.  She does have notable weight loss and decreased appetite with generalized weakness. She does continue to smoke cigarettes daily.   I spoke with Dr. 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 Einar Gip for further evaluation.  It appears per his records that she was not stented and has not followed up with him for sometime as  well.  Caution was recommended with contrast studies in the past due to her Grave's disease but she does appear to have a CT with and without contrast angiogram of abdomen and pelvix in 03/2013 that showed no evidence of aortic aneurysm or dissection but did show extensive calcific atherosclerotic changes throughout the abdominal aorta and branch vessels.    Past Medical History  Diagnosis Date  . Intraductal carcinoma 06/2003    Of right breast. s/p right partial mastectomy. // Followed by Dr. Marylene Buerger  . PVD (peripheral vascular disease)     S/P BL femoral-popliteal bypass surgery 03/2001 (right) and 01/2003 (left)  . Diabetes mellitus, type 2     Well controlled  . Hypertension   . Hyperlipidemia   . TB lung, latent     Treated with INH in 11/2007  . Arthritis     lt hip  . Thyroid disease     Graves disease  . Ulcer     gastric antral ulcer and AVMs   Current Outpatient Prescriptions  Medication Sig Dispense Refill  . amLODipine (NORVASC) 10 MG tablet Take 1 tablet (10 mg total) by mouth daily.  30 tablet  3  . docusate sodium 100 MG CAPS Take 100 mg by mouth daily.  30 capsule  1  . dorzolamide-timolol (COSOPT) 22.3-6.8 MG/ML ophthalmic solution Place 1 drop into both eyes daily.       Marland Kitchen gentamicin (GARAMYCIN) 0.3 % ophthalmic solution  Place 1 drop into the right eye 4 (four) times daily.      Marland Kitchen ketorolac (ACULAR) 0.5 % ophthalmic solution Place 1 drop into the right eye 2 (two) times daily.      Marland Kitchen latanoprost (XALATAN) 0.005 % ophthalmic solution Place 1 drop into both eyes at bedtime.      Marland Kitchen lisinopril (PRINIVIL,ZESTRIL) 40 MG tablet Take 40 mg by mouth daily.      . methimazole (TAPAZOLE) 5 MG tablet TAKE 1 TABLET BY MOUTH TWICE DAILY  60 tablet  0  . metoprolol tartrate (LOPRESSOR) 25 MG tablet Take 1 tablet (25 mg total) by mouth 2 (two) times daily.  30 tablet  3  . ondansetron (ZOFRAN) 4 MG tablet       . pantoprazole (PROTONIX) 40 MG tablet Take 1 tablet (40 mg total)  by mouth 2 (two) times daily before a meal.  60 tablet  5  . sucralfate (CARAFATE) 1 G tablet Take 1 tablet (1 g total) by mouth 4 (four) times daily -  with meals and at bedtime.  120 tablet  5   No current facility-administered medications for this visit.   Family History  Problem Relation Age of Onset  . Cancer Daughter 85    uterine cancer  . Cancer Father    History   Social History  . Marital Status: Widowed    Spouse Name: N/A    Number of Children: N/A  . Years of Education: N/A   Occupational History  . nurse     Private duty nurse   Social History Main Topics  . Smoking status: Former Smoker -- 0.20 packs/day for 64 years    Types: Cigarettes    Quit date: 02/28/2013  . Smokeless tobacco: Never Used     Comment: previously smoked 1 ppd x 64 yrs; cutting back.  . Alcohol Use: No  . Drug Use: No  . Sexual Activity: No   Other Topics Concern  . None   Social History Narrative   Lives alone with home health aid Monday-Friday   Review of Systems:  Constitutional:  Weightloss, fatigue, and decreased appetite. Denies fever or chills.  HEENT:  Denies congestion, sore throat  Respiratory:  Denies SOB  Cardiovascular:  Denies chest pain, palpitations, and leg swelling.   Gastrointestinal:  Abdominal pain, nausea, constipation  Genitourinary:  Denies dysuria  Musculoskeletal:  Back pain. Denies joint swelling.   Skin:  Denies pallor, rash and wound.   Neurological:  Generalized weakness.  Denies dizziness, syncope.   Objective:  Physical Exam: Filed Vitals:   01/25/14 1342  BP: 108/52  Pulse: 78  Temp: 96.8 F (36 C)  TempSrc: Oral  Weight: 82 lb 8 oz (37.422 kg)  SpO2: 96%   Vitals reviewed. General: sitting in chair, acute painful distress, thin HEENT: EOMI Neck: palpable non-tender central goiter Cardiac: RRR Pulm: clear to auscultation bilaterally, no wheezes, rales, or rhonchi Abd: soft, thin, loose skin, tenderness to light and deep palpation  epigastric and left abdomen radiating to left side of back to mid spine, +cva tenderness, nondistended, BS present. Palpable mid-abdominal pulse with bruit Ext: warm, thin, dry, +2dp b/l, -edema, shiny, no hair Neuro: alert and oriented X3, sensation grossly intact, generalized weakness, walks slow but steady without assistance  Assessment & Plan:  Discussed with Dr. Dareen Piano and Dr. Benson Norway (phone) A1C done today Admits to IMTS for further work up of abdominal pain. Dr. Benson Norway agrees and may also need to see while  inpatient.  CBC with diff, CMET, LA, U/A, TSH, fT4 ordered in clinic. Abdominal u/s ordered as well.  Spoke with Dr. Benson Norway on the phone Recommend to discuss case also with Dr. Nadyne Coombes and Endocrine Dr. Cruzita Lederer for further information today.
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
## 2014-01-25 NOTE — Assessment & Plan Note (Signed)
Lab Results  Component Value Date   HGBA1C 6.0 01/25/2014   HGBA1C 6.2 01/28/2013   HGBA1C 8.1 11/05/2012    Assessment: Diabetes control: good control (HgbA1C at goal) Progress toward A1C goal:  at goal  Plan: Medications:  on no medications at this time Home glucose monitoring: Frequency:   Timing:   Instruction/counseling given: no instruction/counseling

## 2014-01-25 NOTE — Assessment & Plan Note (Addendum)
x2 months, severe, constant, radiating to left side of back to mid spine, worse with supine position, not changed with food intake. Associated with decreased po intake, weight-loss. Hx of mesenteric ichemia per GI with ?celiac artery stenosis. Given concerning hx and current symptoms and clinical presentation, concern certainly present for recurrent mesenteric ischemia vs. Diverticulosis/diverticulitis vs. AAA (pulsation on abdominal exam and has audible bruit), vs. PUD. UTI less likely given no pyuria on urine dipstick. Constipation could be contributing as well given no BM since Saturday per patient.  -admit to IMTS for further work up -dicussed patient with Dr. Benson Norway briefly on phone today for further information--agrees with admission -need to touch base with Dr. Einar Gip (cardiology) and Dr. Renne Crigler (endocrine) as well as they have been involved with the patient's care in the past but records not seen on epic -CBC with diff, CMET, Lactic acid, TSH, fT4, u/a, abdominal ultrasound stat -would benefit from CT angio of abdomen but needs contrast, however given hx of Grave's disease and being off medication, caution with contrast will be needed -pain control -likely will need IVF given decreased po intake -likely NPO for now until abdominal ultrasound -discussed with admitting team, Dr. Eulas Post who will admit -case discussed with Dr. Dareen Piano who evaluated the patient and agrees with admission

## 2014-01-26 ENCOUNTER — Observation Stay (HOSPITAL_COMMUNITY): Payer: PRIVATE HEALTH INSURANCE

## 2014-01-26 LAB — BASIC METABOLIC PANEL
BUN: 21 mg/dL (ref 6–23)
CO2: 27 mEq/L (ref 19–32)
Calcium: 9.1 mg/dL (ref 8.4–10.5)
Chloride: 105 mEq/L (ref 96–112)
Creatinine, Ser: 0.52 mg/dL (ref 0.50–1.10)
GFR calc Af Amer: >90 mL/min (ref >90)
GFR calc non Af Amer: 87 mL/min — ABNORMAL LOW (ref >90)
Glucose, Bld: 96 mg/dL (ref 70–99)
Potassium: 4.1 mEq/L (ref 3.7–5.3)
Sodium: 143 mEq/L (ref 137–147)

## 2014-01-26 MED ORDER — FENTANYL 12 MCG/HR TD PT72: 12.5000 ug | MEDICATED_PATCH | TRANSDERMAL | Status: DC

## 2014-01-26 MED ORDER — BOOST / RESOURCE BREEZE PO LIQD: 1.0000 | Freq: Two times a day (BID) | ORAL | Status: DC

## 2014-01-26 MED ORDER — METHIMAZOLE 5 MG PO TABS
5.0000 mg | ORAL_TABLET | Freq: Two times a day (BID) | ORAL | Status: DC
  Filled 2014-01-26 (×2): qty 1

## 2014-01-26 MED ORDER — ADULT MULTIVITAMIN W/MINERALS CH
1.0000 | ORAL_TABLET | Freq: Every day | ORAL | Status: DC
  Administered 2014-01-26: 1 via ORAL
  Filled 2014-01-26: qty 1

## 2014-01-26 MED ORDER — METHIMAZOLE 5 MG PO TABS: 5.0000 mg | ORAL_TABLET | Freq: Two times a day (BID) | ORAL | Status: DC

## 2014-01-26 MED ORDER — FENTANYL 12 MCG/HR TD PT72
12.5000 ug | MEDICATED_PATCH | TRANSDERMAL | Status: DC
  Administered 2014-01-26: 12.5 ug via TRANSDERMAL
  Filled 2014-01-26: qty 1

## 2014-01-26 MED ORDER — POLYETHYLENE GLYCOL 3350 17 G PO PACK: 17.0000 g | PACK | Freq: Every day | ORAL | Status: DC | PRN

## 2014-01-26 MED ORDER — BOOST / RESOURCE BREEZE PO LIQD
1.0000 | Freq: Two times a day (BID) | ORAL | Status: DC
  Administered 2014-01-26 (×2): 1 via ORAL

## 2014-01-26 MED ORDER — SENNOSIDES-DOCUSATE SODIUM 8.6-50 MG PO TABS
1.0000 | ORAL_TABLET | Freq: Two times a day (BID) | ORAL | Status: DC
  Administered 2014-01-26: 1 via ORAL
  Filled 2014-01-26: qty 1

## 2014-01-26 MED ORDER — ADULT MULTIVITAMIN W/MINERALS CH: 1.0000 | ORAL_TABLET | Freq: Every day | ORAL | Status: DC

## 2014-01-26 NOTE — Progress Notes (Signed)
INTERNAL MEDICINE TEACHING ATTENDING ADDENDUM - Gina Contes, MD: I personally saw and evaluated [name] in this clinic visit in conjunction with the resident, Dr. Aretta Nip discussed patient's plan of care with medical resident during this visit. I have confirmed the physical exam findings and have read and agree with the clinic note including the plan with the following addition: Pt with worsening abdominal pain and weight loss likely secondary to chronic mesenteric ischemia Pt with palpable epigastric pulse with bruit. Would get abd sono and follow up Given history of Grave's disease would hold off on CT angiography for now Pt admitted to IMTS for further work up

## 2014-01-26 NOTE — Evaluation (Signed)
Physical Therapy Evaluation Patient Details Name: Gina [name] MRN: [id number] DOB: 10-Jul-1929 Today's Date: 01/26/2014   History of Present Illness  Gina [name] is a 80 y.o. African American female with PMH of PVD, DM2, HTN, Graves disease (on methimazole), antral ulcer and gastric AVMs, and R breast cancer s/p mastectomy presenting to [facility] today for acute visit with complaints of left sided abdominal pain and constipation. Pt admitted for work-up of abdominal pain. She has recieved pain meds and reports that she was told by MD that she had a rib fx on the left side after her fall on 10/14 that did not originally show up on x-ray. Pain has decreased as of 01/26/14.  Clinical Impression  Pt ambulated 300' on eval, 150' with no AD and 150' with RW. Pt with balance loss with no AD with self-correction but required close guarding for safety. Much safer with RW and encouraged to use hers at home upon d/c. Recommend HHPT for strengthening. Generalized weakness and balance can be addressed in the home setting as pt is set for d/c today.  Acute PT signing off.     Follow Up Recommendations Home health PT;Supervision - Intermittent    Equipment Recommendations  None recommended by PT    Recommendations for Other Services       Precautions / Restrictions Precautions Precautions: Fall Precaution Comments: h/o falls Restrictions Weight Bearing Restrictions: No      Mobility  Bed Mobility Overal bed mobility: Modified Independent                Transfers Overall transfer level: Modified independent Equipment used: None                Ambulation/Gait Ambulation/Gait assistance: Min guard Ambulation Distance (Feet): 300 Feet Assistive device: None;Rolling walker (2 wheeled) Gait Pattern/deviations: Step-through pattern;Decreased stride length Gait velocity: decreased   General Gait Details: occasional drift left with 1 LOB though pt self-corrected. Min-guard A with  no AD, second 150', pt was given RW and ambulated with no LOB and supervision. Recommend RW for initial mobility upon d/c.  Stairs            Wheelchair Mobility    Modified Rankin (Stroke Patients Only)       Balance Overall balance assessment: Needs assistance Sitting-balance support: No upper extremity supported;Feet supported Sitting balance-Leahy Scale: Normal     Standing balance support: No upper extremity supported;During functional activity Standing balance-Leahy Scale: Good Standing balance comment: pt with less ability to correct with wt-shift to left                             Pertinent Vitals/Pain VSS    Home Living Family/patient expects to be discharged to:: Private residence Living Arrangements: Alone Available Help at Discharge: Personal care attendant Type of Home: Apartment Home Access: Level entry     Home Layout: One level Home Equipment: Cane - single point;Shower seat;Walker - standard Additional Comments: pt has an aide that comes 7 days/ wk for about 3 hrs/ day    Prior Function Level of Independence: Needs assistance   Gait / Transfers Assistance Needed: ambulates without AD in home; community takes cane  ADL's / Homemaking Assistance Needed: [place] aide assist with cooking and cleaning  Comments: pt drives herself to store and doctor's visits     Hand Dominance   Dominant Hand: Right    Extremity/Trunk Assessment   Upper Extremity Assessment: Overall  WFL for tasks assessed           Lower Extremity Assessment: LLE deficits/detail;Generalized weakness;RLE deficits/detail RLE Deficits / Details: hip flex 4-/5, knee ext 4-/5, knee flex 4-/5 LLE Deficits / Details: hip flex 3+/5, knee ext 3+/5, knee flex 3+/5. Pt does not recall why LLE weaker than right though she has h/o claudication with fem-pop that could contribute. Weakness affects her balance with gait  Cervical / Trunk Assessment: Normal  Communication    Communication: No difficulties  Cognition Arousal/Alertness: Awake/alert Behavior During Therapy: WFL for tasks assessed/performed Overall Cognitive Status: Within Functional Limits for tasks assessed                      General Comments      Exercises Other Exercises Other Exercises: discussed HEP and recommend f/u with HHPT      Assessment/Plan    PT Assessment All further PT needs can be met in the next venue of care  PT Diagnosis Generalized weakness   PT Problem List Decreased strength;Decreased balance;Decreased mobility;Decreased safety awareness  PT Treatment Interventions     PT Goals (Current goals can be found in the Care Plan section) Acute Rehab PT Goals Patient Stated Goal: return home PT Goal Formulation: No goals set, d/c therapy    Frequency     Barriers to discharge Decreased caregiver support pt alone many hours of day    Co-evaluation               End of Session Equipment Utilized During Treatment: Gait belt Activity Tolerance: Patient tolerated treatment well Patient left: in bed;with call bell/phone within reach;with bed alarm set Nurse Communication: Mobility status    Functional Assessment Tool Used: clinical judgement Functional Limitation: Mobility: Walking and moving around Mobility: Walking and Moving Around Current Status ([id number]): At least 1 percent but less than 20 percent impaired, limited or restricted Mobility: Walking and Moving Around Goal Status [phone]): At least 1 percent but less than 20 percent impaired, limited or restricted Mobility: Walking and Moving Around Discharge Status [phone]): At least 1 percent but less than 20 percent impaired, limited or restricted    Time: 1430-1451 PT Time Calculation (min): 21 min   Charges:   PT Evaluation $Initial PT Evaluation Tier I: 1 Procedure PT Treatments $Gait Training: 8-22 mins   PT G Codes:   Functional Assessment Tool Used: clinical judgement Functional  Limitation: Mobility: Walking and moving around  [place], PT  Acute Rehab Services  [place] 01/26/2014, 3:19 PM

## 2014-01-26 NOTE — Discharge Instructions (Addendum)
Please take you medications as directed on this paper.  Given this to your home health aid.  Please make it to your follow up appointment with Dr. Owens Shark next week.  Remember that the fentanyl patches last for 3 days.
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
## 2014-01-26 NOTE — Progress Notes (Signed)
NURSING PROGRESS NOTE  Gina [name] [id number] Discharge Data: 01/26/2014 6:07 PM Attending Provider: Dominic Pea, DO [id number], [place], MD   [name] to be D/C'd Home per MD order.    All IV's will be discontinued and monitored for bleeding.  All belongings will be returned to patient for patient to take home.  Last Documented Vital Signs:  Blood pressure 140/60, pulse 61, temperature 98.2 F (36.8 C), temperature source Oral, resp. rate 18, height 5\' 6"  (1.676 m), weight 37.423 kg (82 lb 8 oz), SpO2 100.00%.  [name], MSN, RN, [employer]

## 2014-01-26 NOTE — H&P (Signed)
INTERNAL MEDICINE TEACHING SERVICE Attending Admission Note  Date: 01/26/2014  Patient name: Gina [name]  Medical record number: [id number]  Date of birth: [birth year]    I have seen and evaluated Gina [name] and discussed their care with the Residency Team.  80 yr old woman with hx Graves disease, PVD, Type 2 DM, tobacco abuse, hx chronic mesenteric ischemia, gastric AVM's, hx right breast cancer s/p mastectomy, presented due to left flank/abdominal pain. She denies any falls. She admits to weight loss. She admitted to constipation.  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 But, she denies any abdominal pain aggravated by eating. In fact, she's hungry. This morning, she described abdominal pain in her left flank radiating to her spine. She had some point tenderness near her lower ribs. Review of laboratory studies indicates a normal lactic acid. She has a free T4 of 2.91. Adherence may be an issue. CXR today reveals evidence of left 10th rib fracture, which likely explains her pain. She denies fall or trauma. Given her hx, a pathologic fracture is of concern. She will need further evaluation for recurrence of breast CA or a new malignancy. This can be performed in outpatient setting (Bone scan). PT eval. Will need to discuss best dispo for her.  Dominic Pea, DO, [facility] 01/26/2014, 12:13 PM

## 2014-01-26 NOTE — Care Management Note (Signed)
    Page 1 of 1   01/26/2014     5:07:52 PM CARE MANAGEMENT NOTE 01/26/2014  Patient:  Gina [name], Gina [name]   Account Number:  [id number]  Date Initiated:  01/26/2014  Documentation initiated by:  Tomi Bamberger  Subjective/Objective Assessment:   dx mesenteric ischemia  admit as observation. Pateint has an aide already.     Action/Plan:   pt eval- recs hhpt.   Anticipated DC Date:  01/26/2014   Anticipated DC Plan:  [place]  CM consult      [facility] Choice  HOME HEALTH   Choice offered to / List presented to:  C-1 Patient        [place] arranged  [place] PT      [facility].   Status of service:  Completed, signed off Medicare Important Message given?  NO (If response is "NO", the following Medicare IM given date fields will be blank) Date Medicare IM given:   Date Additional Medicare IM given:    Discharge Disposition:  [place]  Per UR Regulation:  Reviewed for med. necessity/level of care/duration of stay  If discussed at [place] of Stay Meetings, dates discussed:    Comments:

## 2014-01-26 NOTE — Progress Notes (Signed)
Subjective: Patient notes some improvement in her abdominal pain.   She describes her abdominal pain as starting in her left flank and radiating to her spine. No events on telemetry. Objective: Vital signs in last 24 hours: Filed Vitals:   01/25/14 1739 01/25/14 2032 01/26/14 0517  BP: 133/69 165/71 140/60  Pulse: 70 63 61  Temp: 97.7 F (36.5 C) 97.7 F (36.5 C) 98.2 F (36.8 C)  TempSrc: Oral Oral Oral  Resp: 18 18 18   Height: 5\' 6"  (1.676 m)    Weight: 82 lb 8 oz (37.423 kg)  82 lb 8 oz (37.423 kg)  SpO2: 100% 100% 100%   Weight change:   Intake/Output Summary (Last 24 hours) at 01/26/14 0749 Last data filed at 01/26/14 0620  Gross per 24 hour  Intake      0 ml  Output    450 ml  Net   -450 ml   General: resting in bed, cachetic appearing HEENT: PERRL, EOMI, no scleral icterus Cardiac: RRR, no rubs, murmurs or gallops Pulm: CTA b/l Abd: soft, nontender, nondistended, BS present, +abdominal bruit, no hip tenderness, tenderness of left flank Ext: warm and well perfused, no pedal edema Neuro: alert and oriented X2 Lab Results: Basic Metabolic Panel:  Recent Labs Lab 01/25/14 1453  NA 143  K 3.8  CL 101  CO2 30  GLUCOSE 123*  BUN 28*  CREATININE 0.68  CALCIUM 9.7   Liver Function Tests:  Recent Labs Lab 01/25/14 1453  AST 12  ALT 10  ALKPHOS 58  BILITOT 0.4  PROT 7.1  ALBUMIN 3.8   CBC:  Recent Labs Lab 01/25/14 1453  WBC 4.7  NEUTROABS 2.7  HGB 11.9*  HCT 34.2*  MCV 98.6  PLT 185  CBG:  Recent Labs Lab 01/25/14 1357 01/25/14 2109  GLUCAP 126* 212*   Hemoglobin A1C:  Recent Labs Lab 01/25/14 1406  HGBA1C 6.0   Thyroid Function Tests:  Recent Labs Lab 01/25/14 1453  TSH 0.014*  FREET4 2.91*   Urinalysis:  Recent Labs Lab 01/25/14 1442  BILIRUBINUR Small  PROTEINUR 30 mg/dL  UROBILINOGEN 1.0  NITRITE Negative  LEUKOCYTESUR Negative   Micro Results: No results found for this or any previous visit (from the  past 240 hour(s)). Studies/Results: No results found. Medications: I have reviewed the patient's current medications. Scheduled Meds: . dorzolamide-timolol  1 drop Both Eyes Daily  . enoxaparin (LOVENOX) injection  20 mg Subcutaneous Q24H  . ketorolac  1 drop Right Eye BID  . latanoprost  1 drop Both Eyes QHS  . pantoprazole  40 mg Oral Daily  . polyethylene glycol  17 g Oral Daily  . sodium chloride  3 mL Intravenous Q12H   Continuous Infusions: . sodium chloride 125 mL/hr at 01/26/14 0404   PRN Meds:.morphine injection, promethazine Assessment/Plan: Left flank pain -Worsened by movement, not shooting or coming in waves. U/A with no pyruia or hematuria. Acute mesenteric ischemia ruled out with normal lactic acid.  Suspect MSK origin of pain. - Obtain Rib and Chest Xray to rule out acute fracture given malnutrition - Xray shows left 10th rib fracture.  Chronic Mesenteric Ischemia:  Lactic acid yesterday 1.4.   - Abdominal U/S pending - Requested records from Dr. Benson Norway, Dr. Jinny Blossom disease with  thyrotoxicosis:  -TSH 0.014, Free T4 2.91, likely explanation for patient's weight loss -Discussed with Dr. Cruzita Lederer, will restart methimazole at 5mg  BID with meals  HTN:  BP trending up.  Patient is having  trouble with her home medications, currently taking an ACE and ARB, will attempt to get records from Dr. Einar Gip office.  DM type 2, well controlled:  -CBG monitoring, no SSI at this time   Tobacco abuse:  - smoking cessation counseling  Constipation -No BM yet, moderate amount of stool radiographicly. - Change Miralax to BID, add Senokot-S BID  Severe protein calorie malnutrition - Nutrition consulted  DVT PPx: lovenox  Diet: HH/carb mod, supplements Code status: full Dispo: Pending PT eval.  The patient does have a current PCP (Jenetta Downer, MD) and does not need an [facility] hospital follow-up appointment after discharge.  The patient does not know have  transportation limitations that hinder transportation to clinic appointments.  .Services Needed at time of discharge: Y = Yes, Blank = No PT:   OT:   RN:   Equipment:   Other:     LOS: 1 day   Joni Reining, DO 01/26/2014, 7:49 AM

## 2014-02-02 ENCOUNTER — Encounter: Payer: Self-pay | Admitting: Internal Medicine

## 2014-02-02 ENCOUNTER — Ambulatory Visit (INDEPENDENT_AMBULATORY_CARE_PROVIDER_SITE_OTHER): Payer: PRIVATE HEALTH INSURANCE | Admitting: Internal Medicine

## 2014-02-02 VITALS — BP 164/60 | HR 61 | Temp 96.6°F | Wt 82.6 lb

## 2014-02-02 DIAGNOSIS — X58XXXA Exposure to other specified factors, initial encounter: Secondary | ICD-10-CM

## 2014-02-02 DIAGNOSIS — I10 Essential (primary) hypertension: Secondary | ICD-10-CM

## 2014-02-02 DIAGNOSIS — S2232XA Fracture of one rib, left side, initial encounter for closed fracture: Secondary | ICD-10-CM

## 2014-02-02 DIAGNOSIS — E119 Type 2 diabetes mellitus without complications: Secondary | ICD-10-CM

## 2014-02-02 DIAGNOSIS — S2239XA Fracture of one rib, unspecified side, initial encounter for closed fracture: Secondary | ICD-10-CM

## 2014-02-02 LAB — GLUCOSE, CAPILLARY: Glucose-Capillary: 110 mg/dL — ABNORMAL HIGH (ref 70–99)

## 2014-02-02 MED ORDER — CALCIUM CITRATE-VITAMIN D 315-200 MG-UNIT PO TABS: 2.0000 | ORAL_TABLET | Freq: Two times a day (BID) | ORAL | Status: DC

## 2014-02-02 MED ORDER — FENTANYL 12 MCG/HR TD PT72: 12.5000 ug | MEDICATED_PATCH | TRANSDERMAL | Status: DC

## 2014-02-02 MED ORDER — AMLODIPINE BESYLATE-VALSARTAN 10-320 MG PO TABS: 1.0000 | ORAL_TABLET | Freq: Every day | ORAL | Status: DC

## 2014-02-02 NOTE — Assessment & Plan Note (Signed)
The patient notes improvement of left-sided rib fracture pain with fentanyl. She notes no significant side effects from the fentanyl. During her hospitalization, there was a question of whether the patient needed a bone scan, given her rib fracture of unclear etiology, though later in the hospitalization she reported a fall prior to rib fracture.  I discussed with the patient our options, including watching and waiting, or ordering a bone scan and considering aggressive chemotherapy/radiation. At this point, the patient prefers to defer this scan.  Last DEXA scan 2013 shows osteopenia. -continue fentanyl patches for short-term pain relief for rib fracture -start calcium/vitamin D for osteopenia

## 2014-02-02 NOTE — Progress Notes (Signed)
HPI The patient is a 80 y.o. female with a history of HTN, DM, presenting for a hospital follow-up for a rib fracture.  The patient presents for a hospital follow-up, 4/28-29, for left side pain, found to have a left 10th rib fracture.  The patient notes that her pain is well-controlled with fentanyl, 12.5 mcg, every 3 days, though she "can tell" when the medication is beginning to run out.  The patient has 3 patches remaining.  She notes no significant fatigue, drowsiness, or constipation.  The patient has a history of HTN.  During her hospitalization, her BP medications were optimized, due to conflicting and outdated prescriptions.  BP is only mildly elevated today.  ROS: General: no fevers, chills, changes in weight, changes in appetite Skin: no rash HEENT: no blurry vision, hearing changes, sore throat Pulm: no dyspnea, coughing, wheezing CV: no chest pain, palpitations, shortness of breath Abd: no abdominal pain, nausea/vomiting, diarrhea/constipation GU: no dysuria, hematuria, polyuria Ext: no arthralgias, myalgias Neuro: no weakness, numbness, or tingling  Filed Vitals:   02/02/14 1026  BP: 164/60  Pulse: 61  Temp: 96.6 F (35.9 C)    PEX General: alert, cooperative, and in no apparent distress HEENT: pupils equal round and reactive to light, vision grossly intact, oropharynx clear and non-erythematous  Neck: supple Lungs: clear to ascultation bilaterally, normal work of respiration, no wheezes, rales, ronchi Heart: regular rate and rhythm, no murmurs, gallops, or rubs.  Left chest wall with minimal TTP Abdomen: soft, non-tender, non-distended, normal bowel sounds Extremities: 2+ DP/PT pulses bilaterally, no cyanosis, clubbing, or edema Neurologic: alert & oriented X3, cranial nerves II-XII intact, strength grossly intact, sensation intact to light touch  Current Outpatient Prescriptions on File Prior to Visit  Medication Sig Dispense Refill  . amLODipine-valsartan  (EXFORGE) 10-160 MG per tablet Take 1 tablet by mouth daily.      Marland Kitchen aspirin EC 81 MG tablet Take 81 mg by mouth daily.      Marland Kitchen atorvastatin (LIPITOR) 10 MG tablet Take 10 mg by mouth daily at 6 PM.       . atropine 1 % ophthalmic solution Place 1 drop into the right eye 2 (two) times daily.      Marland Kitchen docusate sodium 100 MG CAPS Take 100 mg by mouth daily.  30 capsule  1  . dorzolamide-timolol (COSOPT) 22.3-6.8 MG/ML ophthalmic solution Place 1 drop into both eyes 2 (two) times daily.       . feeding supplement, RESOURCE BREEZE, (RESOURCE BREEZE) LIQD Take 1 Container by mouth 2 (two) times daily between meals.    0  . fentaNYL (DURAGESIC - DOSED MCG/HR) 12 MCG/HR Place 1 patch (12.5 mcg total) onto the skin every 3 (three) days.  5 patch  0  . gentamicin (GARAMYCIN) 0.3 % ophthalmic solution Place 1 drop into the right eye 4 (four) times daily.      Marland Kitchen ketorolac (ACULAR) 0.5 % ophthalmic solution Place 1 drop into the right eye 2 (two) times daily.      Marland Kitchen latanoprost (XALATAN) 0.005 % ophthalmic solution Place 1 drop into both eyes at bedtime.      . methimazole (TAPAZOLE) 5 MG tablet Take 1 tablet (5 mg total) by mouth 2 (two) times daily with a meal.  60 tablet  0  . Multiple Vitamin (MULTIVITAMIN WITH MINERALS) TABS tablet Take 1 tablet by mouth daily.      . pantoprazole (PROTONIX) 40 MG tablet Take 40 mg by mouth daily.      Marland Kitchen  polyethylene glycol (MIRALAX / GLYCOLAX) packet Take 17 g by mouth daily as needed for severe constipation.  14 each  0  . sucralfate (CARAFATE) 1 G tablet Take 1 tablet (1 g total) by mouth 4 (four) times daily -  with meals and at bedtime.  120 tablet  5   No current facility-administered medications on file prior to visit.    Assessment/Plan

## 2014-02-02 NOTE — Discharge Summary (Signed)
  Date: 02/02/2014  Patient name: Gina [name]  Medical record number: [id number]  Date of birth: 01-24-1929   This patient has been seen and the plan of care was discussed with the house staff. Please see their note for complete details. I concur with their findings and plan.  Dominic Pea, DO, [place] [facility] 02/02/2014, 8:09 AM

## 2014-02-02 NOTE — Discharge Summary (Signed)
Name: Gina [name] MRN: [id number] DOB: June 25, 1929 80 y.o. PCP: Jenetta Downer, MD  Date of Admission: 01/25/2014  5:10 PM Date of Discharge: 02/02/2014 Attending Physician: No att. providers found  Discharge Diagnosis: Active Problems:   Chronic mesenteric insufficiency Left 10th rib fracture Graves disease HTN  DM2, well controlled Tobacco Abuse  Constipation  Discharge Medications:   Medication List    STOP taking these medications       chlorthalidone 25 MG tablet  Commonly known as:  HYGROTON     lisinopril 40 MG tablet  Commonly known as:  PRINIVIL,ZESTRIL     metoprolol tartrate 25 MG tablet  Commonly known as:  LOPRESSOR      TAKE these medications       amLODipine-valsartan 10-160 MG per tablet  Commonly known as:  EXFORGE  Take 1 tablet by mouth daily.     aspirin EC 81 MG tablet  Take 81 mg by mouth daily.     atorvastatin 10 MG tablet  Commonly known as:  LIPITOR  Take 10 mg by mouth daily at 6 PM.     atropine 1 % ophthalmic solution  Place 1 drop into the right eye 2 (two) times daily.     dorzolamide-timolol 22.3-6.8 MG/ML ophthalmic solution  Commonly known as:  COSOPT  Place 1 drop into both eyes 2 (two) times daily.     DSS 100 MG Caps  Take 100 mg by mouth daily.     feeding supplement (RESOURCE BREEZE) Liqd  Take 1 Container by mouth 2 (two) times daily between meals.     fentaNYL 12 MCG/HR  Commonly known as:  DURAGESIC - dosed mcg/hr  Place 1 patch (12.5 mcg total) onto the skin every 3 (three) days.     gentamicin 0.3 % ophthalmic solution  Commonly known as:  GARAMYCIN  Place 1 drop into the right eye 4 (four) times daily.     ketorolac 0.5 % ophthalmic solution  Commonly known as:  ACULAR  Place 1 drop into the right eye 2 (two) times daily.     latanoprost 0.005 % ophthalmic solution  Commonly known as:  XALATAN  Place 1 drop into both eyes at bedtime.     methimazole 5 MG tablet  Commonly known as:   TAPAZOLE  Take 1 tablet (5 mg total) by mouth 2 (two) times daily with a meal.     multivitamin with minerals Tabs tablet  Take 1 tablet by mouth daily.     pantoprazole 40 MG tablet  Commonly known as:  PROTONIX  Take 40 mg by mouth daily.     polyethylene glycol packet  Commonly known as:  MIRALAX / GLYCOLAX  Take 17 g by mouth daily as needed for severe constipation.     sucralfate 1 G tablet  Commonly known as:  CARAFATE  Take 1 tablet (1 g total) by mouth 4 (four) times daily -  with meals and at bedtime.        Disposition and follow-up:   Gina [name] was discharged from [facility] in Stable condition.  At the hospital follow up visit please address:  1.  HTN: On admission there was confusion about patient's blood pressure regimen as she had both a ACEi and ARB on her list and she was unable to clearly state what medications she was taking.  Records were received from Dr. Irven Shelling office and she was discharged on Amlodipine-Valsartan combination.  Her chlorthalidone and metoprolol  were removed from her medication list.  2. Pain control. Patient admitted for abdmoninal pain concerning for mesenteric ischemia (given her vascular history) however this was found to be due to a left rib fracture, she was discharged with fentanyl patches for pain control.  3. Reason for fracture, initially patient denied any trauma to the area or falls, after diagnosis of rib fracture she reports she may have fallen on that side a few weeks ago.  Please consider a Bone scan for evaluation of recurrence of breast CA or new malignancy that could cause a pathologic fracture.  2.  Labs / imaging needed at time of follow-up: Consider Bone Scan  3.  Pending labs/ test needing follow-up: None  Follow-up Appointments:     Follow-up Information   Follow up with Elnora Morrison, MD On 02/02/2014. (at 10:30am, bring your medications)    Specialty:  Internal Medicine   Contact  information:   [place] [address] [phone]       Follow up with [facility]. (HHPT, patient has an aide already.)    Contact information:   [address] [place] [address] [phone]       Discharge Instructions: Discharge Orders   Future Appointments Provider Department Dept Phone   02/02/2014 10:30 AM Hester Mates, MD [place] [phone]   02/03/2014 10:00 AM Philemon Kingdom, MD [facility] [phone]   02/15/2014 12:30 PM [facility] [place] [facility] [address] [phone]   02/15/2014 1:30 PM [facility] [place] [phone]   02/15/2014 2:00 PM Mal Misty, MD [facility] [phone]   Future Orders Complete By Expires   Diet - low sodium heart healthy  As directed    Discharge instructions  As directed    Increase activity slowly  As directed       Consultations:    Procedures Performed:  Dg Ribs Bilateral W/chest  01/26/2014   CLINICAL DATA:  left side flank pain  EXAM: BILATERAL RIBS AND CHEST - 4+ VIEW  COMPARISON:  DG CHEST 2 VIEW dated 05/06/2013  FINDINGS: The lungs are hyperinflated. There is flattening of the hemidiaphragms and calcified granuloma left lung base. No focal regions of consolidation or focal infiltrates. Cardiac silhouette mild to moderately enlarged. Atherosclerotic calcifications in the aorta.  A mildly depressed posterior rib fracture involving the tenth rib on the left. There is dextroscoliosis in the thoracolumbar spine. A moderate amount of stool is appreciated within the colon.  IMPRESSION: Left tenth rib fracture.   Electronically Signed   By: Margaree Mackintosh M.D.   On: 01/26/2014 09:49   US Abdomen Complete  01/26/2014   CLINICAL DATA:  Severe abdomen pain.  EXAM: ULTRASOUND ABDOMEN COMPLETE  COMPARISON:  None.  FINDINGS: Gallbladder:  No gallstones or wall  thickening visualized. No sonographic Murphy sign noted.  Common bile duct:  Diameter: 3.3 mm  Liver:  No focal lesion identified. Within normal limits in parenchymal echogenicity.  IVC:  No abnormality visualized.  Pancreas:  Visualized portion unremarkable.  Spleen:  Size and appearance within normal limits.  Right Kidney:  Length: 10 cm. Echogenicity within normal limits. There is a 3 mm echogenic focus in the midpole likely nonobstructing stone. No mass or hydronephrosis visualized.  Left Kidney:  Length: 10.5 cm. Echogenicity within normal limits. There is a 3 mm echogenic focus in the lower pole likely nonobstructing stone. No mass  or hydronephrosis visualized.  Abdominal aorta:  No aneurysm visualized.  There is atherosclerosis of the aorta.  Other findings:  None.  IMPRESSION: Normal gallbladder. Tiny nonobstructing stones in both kidneys. No hydronephrosis bilaterally.   Electronically Signed   By: Abelardo Diesel M.D.   On: 01/26/2014 09:09   Admission HPI: This is an 80 yo AAF with PMH of PVD, DM2, HTN, Graves disease (on methimazole), known abdominal bruit w/ known chronic mesenteric ischemia/abd angina (for the last 1-1.12yrs per Dr. Nadyne Coombes), antral ulcer and gastric AVMs, and R breast cancer s/p mastectomy who presented to [facility] for an acute visit with complaint of L flank/abdominal pain. The abd pain has progressively worsening for the past 2 months and is now 10/10. Pain starts in L middle abdomen and radiates around to L flank. She has had decreased PO intake recently and weight loss (down about 10 lbs since 10/2013). She also endorses constipation, no BM since Saturday, has been suing dulcolax without relief. Pt denies dysuria, N/V/D, bloody stools, F/C, CP, SOB. Of note she takes Methimazole for her Graves disease and ran out of methimazole 11/2013, has not taken any since this time. Per her clinic visit with Dr. Eula Fried today, she was noted to be taking 5mg  bid, however says the dose was reduced to  daily maybe in February.  Dr. Eulas Post spoke with Dr. Einar Gip today who states pt w/ known abd bruit and chronic abd angina x 1 year, pt still smoking despite encouragement to stop, she is too high risk for intervention, she has appointment w/ him 5/6.  Dr. Eulas Post also spoke with Dr. Cruzita Lederer who said that patient's abd pain has persistent despite her thyroid function control. He does not believe her abd pain is directly related to her thyroid disease.  Dr. Eula Fried spoke with Dr. 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 Per Dr. Minerva Areola records, she was not stented and has not followed up with him for sometime. Caution was recommended with contrast studies in the past due to her Grave's disease but she does appear to have a CT with and without contrast angiogram of abdomen and pelvis in 03/2013 that showed no evidence of aortic aneurysm or dissection but did show extensive calcific atherosclerotic changes throughout the abdominal aorta and branch vessels.    Hospital Course by problem list: Left flank pain due to Left 10th rib fracture -Patient presented with left flank pain that was worsened by movement, it was not worsened with eating however given her severe pain and her history of chronic mesenteric ischemia initially there was concern for ischemia.  Admission labs revealed a reassuring lactic acid.  Further workup including a Xray of the ribs revealed a left 10th rib fracture that corresponded with patient's pain.  Upon further questioning she reported she may have fallen on that area but did not seem sure of it. Given her history of breast cancer, would consider an outpatient bone scan to evaluate for possible recurrence of breast CA versus other malignancy given unknown cause of this fracture.  Chronic Mesenteric Ischemia:  Initially there was concern that patient's abdominal pain could be due to her mesenteric ischemia however admission  labs showed a Lactic acid wnl at 1.4.   Graves disease:  -TSH 0.014, Free T4 2.91, likely explanation for patient's weight loss. Discussed with Dr. Cruzita Lederer, will restart methimazole at 5mg  BID with meals   HTN:  Records were obtained from Dr.  Ganji's office indicating that she should be taking Amlodipine-Valsartan and not lisinopril.  Given her only mildly elevated BP and mild dehydration her chlorthalidone was also discontinued on discharge.  She had not been taking metoprolol at home and was mildly bradycardic throughout her stay, this was also discontinued at discharge.  DM type 2, well controlled:  -CBG well controlled with diet without need for insulin  Tobacco abuse:  - smoking cessation counseling   Constipation  -moderate amount of stool radiographicly. Patient treated with Miralax to BID and Senokot-S BID   Discharge Vitals:   BP 140/60  Pulse 61  Temp(Src) 98.2 F (36.8 C) (Oral)  Resp 18  Ht 5\' 6"  (1.676 m)  Wt 82 lb 8 oz (37.423 kg)  BMI 13.32 kg/m2  SpO2 100%  Discharge Labs:  No results found for this or any previous visit (from the past 24 hour(s)).  Signed: Joni Reining, DO 02/02/2014, 12:15 AM   Time Spent on Discharge: 35 minutes Services Ordered on Discharge: [facility] PT Equipment Ordered on Discharge: None

## 2014-02-02 NOTE — Assessment & Plan Note (Signed)
BP Readings from Last 3 Encounters:  02/02/14 164/60  01/26/14 140/60  01/25/14 108/52    Lab Results  Component Value Date   NA 143 01/26/2014   K 4.1 01/26/2014   CREATININE 0.52 01/26/2014    Assessment: Blood pressure control: mildly elevated Progress toward BP goal:  unchanged Comments: BP mildly elevated, since changing some of her BP meds during this hospitalization.  Plan: Medications:  Increase amlodipine-valsartan to 10-320 mg (from 5-160) Educational resources provided:   Self management tools provided:   Other plans: Recheck at next visit

## 2014-02-02 NOTE — Patient Instructions (Signed)
General Instructions: For your High Blood Pressure: -we are increasing your amlodipine-valsartan to 10-320 mg once per day  For your bone health: -we are starting you on calcium and vitamin D supplementation.  Take 2 tabs, twice per day.  This medication may be cheaper over-the-counter.  For your rib fracture: -we have refilled your Fentanyl patch  Please return for a follow-up visit in 3 months.   Treatment Goals:  Goals (1 Years of Data) as of 02/02/14         01/25/14 01/28/13 07/02/12     Result Component    . HEMOGLOBIN A1C < 8  6.0 6.2     . LDL CALC < 100    77      Progress Toward Treatment Goals:  Treatment Goal 02/02/2014  Hemoglobin A1C at goal  Blood pressure unchanged  Stop smoking -  Prevent falls unchanged    Self Care Goals & Plans:  Self Care Goal 01/25/2014  Manage my medications -  Monitor my health -  Eat healthy foods -  Be physically active -  Stop smoking call [facility] (1-800-QUIT-NOW)  Prevent falls -    Home Blood Glucose Monitoring 02/02/2014  Check my blood sugar no home glucose monitoring  When to check my blood sugar N/A     Care Management & Community Referrals:  Referral 02/02/2014  Referrals made for care management support none needed

## 2014-02-02 NOTE — Assessment & Plan Note (Signed)
Lab Results  Component Value Date   HGBA1C 6.0 01/25/2014   HGBA1C 6.2 01/28/2013   HGBA1C 8.1 11/05/2012     Assessment: Diabetes control: good control (HgbA1C at goal) Progress toward A1C goal:  at goal Comments: A1C well controlled with just diet/exercise  Plan: Medications:  Continue diet/exercise Home glucose monitoring: Frequency: no home glucose monitoring Timing: N/A Instruction/counseling given: discussed foot care Educational resources provided:   Self management tools provided:   Other plans: Foot exam done today.  Pt notes an eye exam 1 month ago.

## 2014-02-03 ENCOUNTER — Ambulatory Visit (INDEPENDENT_AMBULATORY_CARE_PROVIDER_SITE_OTHER): Payer: PRIVATE HEALTH INSURANCE | Admitting: Internal Medicine

## 2014-02-03 ENCOUNTER — Encounter: Payer: Self-pay | Admitting: Internal Medicine

## 2014-02-03 VITALS — BP 100/56 | HR 72 | Temp 97.9°F | Resp 12 | Wt 85.0 lb

## 2014-02-03 DIAGNOSIS — E0590 Thyrotoxicosis, unspecified without thyrotoxic crisis or storm: Secondary | ICD-10-CM

## 2014-02-03 NOTE — Patient Instructions (Signed)
Please continue Methimazole 5 mg 2x a day for now. Stop Methimazole 4 days before the Radioactive Iodine Treatment (RAI tx). Please come back for a follow-up appointment in 2 months. We will call you with the schedule for the RAI Tx.

## 2014-02-03 NOTE — Progress Notes (Signed)
Case discussed with Dr. Brown at the time of the visit.  We reviewed the resident's history and exam and pertinent patient test results.  I agree with the assessment, diagnosis, and plan of care documented in the resident's note. 

## 2014-02-03 NOTE — Progress Notes (Signed)
[name], female   DOB: [birth year], 80 y.o.   MRN: [id number]   HPI  Gina [name] is a 80 y.o.-year-old female, initially referred by her PCP, Dr. Benson Norway, for evaluation for thyrotoxycosis [name] ds). Last visit 3 mo ago.  She was recently admitted (01/25/2014) with AP and found to have a low TSH and high free T4. She admitted to not been taking the MMI 5 mg in am for 1 mo. I d/w the resident and we restarted MMI 5 mg bid before d/c.   She started a fentanyl patch for the pain her L side of her abdomen >> healing broken rib >> pain better.   Reviewed hx: She has had a goiter since 80 y/o, however she did not have thyroid hormone dysfunction until recently. She describes onset of left-sided AP this summer >> referred to Dr Benson Norway >> he checked TFTs and they revealed hyperthyroidism. She continues to have AP issues other investigations for this. An EGD performed on 05/07/2013 showed 2 gastric AVMs (s/p APC) and a nonbleeding healing antral ulcer, per records from Dr. Benson Norway. A CT of the abdomen and pelvis did not show an evident cause for her pain. Previous colonoscopy in 2010 was normal.  I reviewed pt's thyroid tests: Lab Results  Component Value Date   TSH 0.014* 01/25/2014   TSH 0.29* 11/22/2013   TSH 0.03* 10/15/2013   TSH 0.00* 09/06/2013   TSH <0.008* 05/06/2013   TSH 0.012* 01/28/2013   FREET4 2.91* 01/25/2014   FREET4 0.69 11/22/2013   FREET4 1.05 10/15/2013   FREET4 3.32* 09/06/2013   FREET4 3.60* 05/06/2013   FREET4 1.51 01/28/2013    An uptake and scan showed uniform uptake within the thyroid gland and no nodules - 24 hour I 131 uptake = 32.2% (normal 10-30%), c/w mild Graves disease.  A thyroid U/S showed several possible smaller nodules, not clearly defined.  We started MMI 5 mg bid >> TFTs improved >> gained 7 lbs and also felt better and her swallowing was better. We then decreased the dose to 5 mg in am.   Pt feels nodules in neck, no hoarseness, + dysphagia/no  odynophagia.  She c/o: - + severe weight loss 136 lbs in 02/2013 >> 85 lbs now - no excessive sweating/heat intolerance; she has cold intolerance - no more tremors - no anxiety - no palpitations - no fatigue - no hyperdefecation; + constipation - + weakness  I reviewed her chart and she also has a history of DM 2, CAD - she apparently had an MI in 01/2013. A 2-D echo showed an EF of 60% in 2009., PVD, HL, HTN, emphysema, DDD, protein-caloric malnutrition.  ROS: Constitutional: see history of present illness, + nocturia Eyes: no blurry vision, no xerophthalmia ENT: see history of present illness, decreased hearing Cardiovascular:no CP/SOB/no palpitations/leg swelling Respiratory: no cough/SOB Gastrointestinal: no N/V/D/+ C Musculoskeletal: + muscle aches/no joint aches Skin: no rashes Neurological: no tremors/numbness/tingling/dizziness  PE: BP 100/56  Pulse 72  Temp(Src) 97.9 F (36.6 C) (Oral)  Resp 12  Wt 85 lb (38.556 kg)  SpO2 99% Wt Readings from Last 3 Encounters:  02/03/14 85 lb (38.556 kg)  02/02/14 82 lb 9.6 oz (37.467 kg)  01/26/14 82 lb 8 oz (37.423 kg)   Constitutional: overweight, in NAD, cachectic appearance, temporal wasting Eyes: PERRLA, EOMI, no exophthalmos, no lid lag, no stare ENT: moist mucous membranes,  lumpy bumpy thyroid, bony prominence in the left side of the lower neck, anteriorly,  no thyroid bruits, no cervical lymphadenopathy Cardiovascular: RRR, No MRG Respiratory: CTA B Gastrointestinal: abdomen soft, NT, ND, BS+ Musculoskeletal: no deformities, strength intact in all 4 Skin: moist, warm, no rashes Neurological: mild tremor with outstretched hands, DTR normal in all 4  ASSESSMENT: 1. Graves ds.  - Thyroid uptake and scan (10/05/2013): There is uniform uptake within the thyroid gland which is mildly  enlarged. No nodularity. 24 hour I 131 uptake = 32.2% (normal 10-30%)  IMPRESSION: Thyroid imaging and uptake suggests mild Graves  disease   - thyroid ultrasound (03/17/2013):  Right thyroid lobe: 5.0 x 2.7 x 2.0 cm   Left thyroid lobe: 4.7 x 2.7 x 1.5 cm   Isthmus: 7.0 mm in thickness in the midline   Focal nodules: The thyroid gland is diffusely heterogeneous. A 1.2 x 1.1 x 1.0 cm rounded, heterogeneous, predominately solid nodule in the mid right lobe, laterally. Suggestion of a 1.5 x 1.3 cm nodule in the mid to inferior left lobe at the level of the isthmus in the transverse plane, not visualized as a discrete nodule in the sagittal plane. Suggestion of a 1.7 x 1.5 x 0.7 cm oval, heterogeneous, predominately solid nodule in the isthmus centered to the right of midline. A 5 mm dense calcification in the isthmus slightly more inferiorly on the right without a well-defined mass separate from the calcification on the images.   Lymphadenopathy: None visualized.  IMPRESSION:  Multinodular goiter, as described above. The only definite discrete, confirmed nodule is in the lateral aspect of the mid right lobe, measuring 1.2 cm in maximum diameter.  - DEXA scan (07/08/2012): Normal  - lumbar spine x-ray (05/06/2013): compression fracture in the superior L1 endplate and [id number] anterior height loss  PLAN:  1. Patient with mild Graves ds, with  thyrotoxic sxs: severe weight loss, tremors, now improved on MMI, but we need definitive tx for her thyrotoxicosis >> will order the RAI Tx and I will see her back ~ 1 mo after this - continue 5 mg MMI 2x a day for now >> advised to stop 4 days prior to RAI tx - no labs today as too soon after restarting MMI - RTC in 2 mo

## 2014-02-14 ENCOUNTER — Encounter: Payer: Self-pay | Admitting: Vascular Surgery

## 2014-02-15 ENCOUNTER — Other Ambulatory Visit: Payer: Self-pay | Admitting: Vascular Surgery

## 2014-02-15 ENCOUNTER — Ambulatory Visit (INDEPENDENT_AMBULATORY_CARE_PROVIDER_SITE_OTHER)
Admission: RE | Admit: 2014-02-15 | Discharge: 2014-02-15 | Disposition: A | Discharge status: Home / Self Care | Payer: PRIVATE HEALTH INSURANCE | Source: Ambulatory Visit | Attending: Vascular Surgery | Admitting: Vascular Surgery

## 2014-02-15 ENCOUNTER — Ambulatory Visit (HOSPITAL_COMMUNITY)
Admission: RE | Admit: 2014-02-15 | Discharge: 2014-02-15 | Disposition: A | Discharge status: Home / Self Care | Payer: PRIVATE HEALTH INSURANCE | Source: Ambulatory Visit | Attending: Vascular Surgery | Admitting: Vascular Surgery

## 2014-02-15 ENCOUNTER — Encounter: Payer: Self-pay | Admitting: Vascular Surgery

## 2014-02-15 ENCOUNTER — Ambulatory Visit (INDEPENDENT_AMBULATORY_CARE_PROVIDER_SITE_OTHER): Payer: PRIVATE HEALTH INSURANCE | Admitting: Vascular Surgery

## 2014-02-15 VITALS — BP 173/52 | HR 68 | Resp 14 | Ht 67.0 in | Wt 83.8 lb

## 2014-02-15 DIAGNOSIS — R0989 Other specified symptoms and signs involving the circulatory and respiratory systems: Secondary | ICD-10-CM

## 2014-02-15 DIAGNOSIS — R600 Localized edema: Secondary | ICD-10-CM

## 2014-02-15 DIAGNOSIS — R609 Edema, unspecified: Secondary | ICD-10-CM

## 2014-02-15 DIAGNOSIS — Z48812 Encounter for surgical aftercare following surgery on the circulatory system: Secondary | ICD-10-CM

## 2014-02-15 DIAGNOSIS — I739 Peripheral vascular disease, unspecified: Secondary | ICD-10-CM

## 2014-02-15 NOTE — Progress Notes (Signed)
Subjective:     [name], female   DOB: 1929/07/12, 80 y.o.   MRN: [id number]  HPI this 80 year old female returns for continued followup regarding her bilateral femoral popliteal bypass graft performed by me in 2002 in 2007. Both are functioning nicely and are performed with Gore-Tex. She denies any active claudication symptoms but does have some instability in her left leg at times. She has no history of CVA or TIAs. Denies lateralizing weakness, aphasia, amaurosis fugax, diplopia, blurred vision, or syncope.   Past Medical History  Diagnosis Date  . Intraductal carcinoma 06/2003    Of right breast. s/p right partial mastectomy. // Followed by Dr. Marylene Buerger  . PVD (peripheral vascular disease)     S/P BL femoral-popliteal bypass surgery 03/2001 (right) and 01/2003 (left)  . Diabetes mellitus, type 2     Well controlled  . Hypertension   . Hyperlipidemia   . TB lung, latent     Treated with INH in 11/2007  . Arthritis     lt hip  . Thyroid disease     Graves disease  . Ulcer     gastric antral ulcer and AVMs  . Mesenteric ischemia   . Graves disease     HX OF GRAVES    History  Substance Use Topics  . Smoking status: Light Tobacco Smoker -- 0.20 packs/day for 64 years    Types: Cigarettes    Last Attempt to Quit: 02/28/2013  . Smokeless tobacco: Never Used     Comment: started back smoking.  . Alcohol Use: No    Family History  Problem Relation Age of Onset  . Cancer Daughter 70    uterine cancer  . Cancer Father     Allergies  Allergen Reactions  . Metformin And Related Nausea Only    And diarrhea  . Simvastatin Other (See Comments)    Dizziness. Well tolerates pravastatin.    Current outpatient prescriptions:amLODipine-valsartan (EXFORGE) 10-320 MG per tablet, Take 1 tablet by mouth daily., Disp: 30 tablet, Rfl: 11;  aspirin EC 81 MG tablet, Take 81 mg by mouth daily., Disp: , Rfl: ;  atorvastatin (LIPITOR) 10 MG tablet, Take 10 mg by  mouth daily at 6 PM. , Disp: , Rfl: ;  atropine 1 % ophthalmic solution, Place 1 drop into the right eye 2 (two) times daily., Disp: , Rfl:  calcium citrate-vitamin D (CITRACAL+D) 315-200 MG-UNIT per tablet, Take 2 tablets by mouth 2 (two) times daily., Disp: 120 tablet, Rfl: 11;  docusate sodium 100 MG CAPS, Take 100 mg by mouth daily., Disp: 30 capsule, Rfl: 1;  dorzolamide-timolol (COSOPT) 22.3-6.8 MG/ML ophthalmic solution, Place 1 drop into both eyes 2 (two) times daily. , Disp: , Rfl:  feeding supplement, RESOURCE BREEZE, (RESOURCE BREEZE) LIQD, Take 1 Container by mouth 2 (two) times daily between meals., Disp: , Rfl: 0;  fentaNYL (DURAGESIC - DOSED MCG/HR) 12 MCG/HR, Place 1 patch (12.5 mcg total) onto the skin every 3 (three) days., Disp: 10 patch, Rfl: 0;  gentamicin (GARAMYCIN) 0.3 % ophthalmic solution, Place 1 drop into the right eye 4 (four) times daily., Disp: , Rfl:  ketorolac (ACULAR) 0.5 % ophthalmic solution, Place 1 drop into the right eye 2 (two) times daily., Disp: , Rfl: ;  latanoprost (XALATAN) 0.005 % ophthalmic solution, Place 1 drop into both eyes at bedtime., Disp: , Rfl: ;  methimazole (TAPAZOLE) 5 MG tablet, Take 1 tablet (5 mg total) by mouth 2 (two) times daily with  a meal., Disp: 60 tablet, Rfl: 0 Multiple Vitamin (MULTIVITAMIN WITH MINERALS) TABS tablet, Take 1 tablet by mouth daily., Disp: , Rfl: ;  pantoprazole (PROTONIX) 40 MG tablet, Take 40 mg by mouth daily., Disp: , Rfl: ;  polyethylene glycol (MIRALAX / GLYCOLAX) packet, Take 17 g by mouth daily as needed for severe constipation., Disp: 14 each, Rfl: 0  BP 167/64  Pulse 67  Resp 14  Ht 5\' 7"  (1.702 m)  Wt 83 lb 12.8 oz (38.011 kg)  BMI 13.12 kg/m2  Body mass index is 13.12 kg/(m^2).          Review of Systems denies chest pain, dyspnea on exertion, PND, orthopnea. Does complain of a "goiter" with some mild discomfort. Denies any difficulty swallowing, but does occasionally have some instability with  her left leg. Other systems negative and a complete review of systems    Objective:   Physical Exam BP 167/64  Pulse 67  Resp 14  Ht 5\' 7"  (1.702 m)  Wt 83 lb 12.8 oz (38.011 kg)  BMI 13.12 kg/m2  Gen.-alert and oriented x3 in no apparent distress HEENT normal for age Lungs no rhonchi or wheezing Cardiovascular regular rhythm no murmurs carotid pulses 3+ palpable  harsh bruit over left carotid artery  Abdomen soft nontender no palpable masses Musculoskeletal free of  major deformities Skin clear -no rashes Neurologic normal Lower extremities 3+ femoral and dorsalis pedis pulses on the left 3+ femoral and 2+ posterior tibial pulse on the right No edema bilaterally  Today I will order a duplex scan of bilateral femoral popliteal graft and ABIs. ABIs are 1.0 bilaterally in both grafts are widely patent I also ordered a carotid duplex exam which I reviewed and interpreted. There is mild left ICA stenosis approximating 40% with a widely patent right ICA        Assessment:     Widely patent bilateral femoral-popliteal bypass grafts with no evidence of stenosis and no evidence of significant carotid occlusive disease on the right mild stenosis on left-asymptomatic    Plan:     Return in one year for bilateral scans of femoral-popliteal bypass grafts and check ABIs

## 2014-02-15 NOTE — Addendum Note (Signed)
Addended by: Mena Goes on: 02/15/2014 04:57 PM   Modules accepted: Orders

## 2014-02-16 ENCOUNTER — Telehealth: Payer: Self-pay | Admitting: *Deleted

## 2014-02-16 NOTE — Telephone Encounter (Signed)
Xray her leg and arms, ultrasound on [facility] just wanted the doctor to know she did have it done

## 2014-02-16 NOTE — Telephone Encounter (Signed)
Please read note below

## 2014-02-17 ENCOUNTER — Other Ambulatory Visit: Payer: Self-pay | Admitting: Internal Medicine

## 2014-02-17 ENCOUNTER — Other Ambulatory Visit: Payer: Self-pay | Admitting: *Deleted

## 2014-02-17 MED ORDER — FENTANYL 12 MCG/HR TD PT72: 12.5000 ug | MEDICATED_PATCH | TRANSDERMAL | Status: DC

## 2014-02-17 NOTE — Telephone Encounter (Signed)
Pt used her last patch on Sunday. [facility] 01/27/14 for 5 patches.

## 2014-02-18 NOTE — Telephone Encounter (Signed)
Rx refilled and given to pt.

## 2014-02-22 ENCOUNTER — Other Ambulatory Visit: Payer: Self-pay | Admitting: Internal Medicine

## 2014-02-22 NOTE — Progress Notes (Signed)
Pt was prescribed 10 fentanyl patches on 2 separate occasions but received 5 on both occasions.  Bing Neighbors, MD. PGY-1.

## 2014-02-23 ENCOUNTER — Other Ambulatory Visit (HOSPITAL_COMMUNITY): Payer: Self-pay | Admitting: Internal Medicine

## 2014-03-17 ENCOUNTER — Other Ambulatory Visit: Payer: Self-pay | Admitting: Internal Medicine

## 2014-03-17 ENCOUNTER — Ambulatory Visit (INDEPENDENT_AMBULATORY_CARE_PROVIDER_SITE_OTHER): Payer: PRIVATE HEALTH INSURANCE | Admitting: Internal Medicine

## 2014-03-17 ENCOUNTER — Encounter: Payer: Self-pay | Admitting: Internal Medicine

## 2014-03-17 VITALS — BP 157/57 | HR 64 | Temp 98.2°F | Ht 66.0 in | Wt 92.9 lb

## 2014-03-17 DIAGNOSIS — S2239XA Fracture of one rib, unspecified side, initial encounter for closed fracture: Secondary | ICD-10-CM

## 2014-03-17 DIAGNOSIS — X58XXXA Exposure to other specified factors, initial encounter: Secondary | ICD-10-CM

## 2014-03-17 DIAGNOSIS — I10 Essential (primary) hypertension: Secondary | ICD-10-CM

## 2014-03-17 DIAGNOSIS — S2232XA Fracture of one rib, left side, initial encounter for closed fracture: Secondary | ICD-10-CM

## 2014-03-17 MED ORDER — LISINOPRIL-HYDROCHLOROTHIAZIDE 20-12.5 MG PO TABS: 1.0000 | ORAL_TABLET | Freq: Every day | ORAL | Status: DC

## 2014-03-17 NOTE — Assessment & Plan Note (Signed)
Persistent asymptomatic isolated systolic HTN. Amlodipine seems to be the causative agent for the patients LE edema and Dr. Einar Gip (cardiology) also recommending to D/C amlodipine. Patient was treated with Lisinopril, HCTZ, Triameterene, Metoprolol in the past and seems to have tolerated well without any significant adverse effects except for mild hypokalemia while on HCTZ. Discussed with the attending regarding further management.  Plans: D/C Amlodipine - Valsartan. Start Lisinopril/ HCTZ 20/12.5 mg ( HCTZ was chosen to help with edema and ACE-I to replace ARB, the combo to simplify the regimen) Clear instructions were given to discard the Amlodipine - Valsartan and to start the Lisinopril - HCTZ from tomorrow. Follow up in 2 weeks to recheck BP and to get BMP.

## 2014-03-17 NOTE — Progress Notes (Signed)
Subjective:   [name] female   DOB: 1929/03/28 80 y.o.   MRN: [id number]  HPI: [name] is a 80 y.o. woman with PMH significant for HTN, DM-II with an A1C 6.0 (april 2015), PVD s/p B/L bypass surgery, recently diagnosed mild graves disease currently on Methimazole therapy and scheduled to undergo RAI treatment comes to the office for consideration to change her HTN regimen.   Dr. Einar Gip with Cardiology recommended to discontinue her Amlodipine as it seems to be causing lower extremity swelling. Patient reports swelling of her both legs over the last one month, which is persistent and slightly tender. Patient reports that she started her Amlodipine- Valsartan right around the time and thinks it is this medicine causing her leg swelling. Patient denies any SOB, DOE, chest pain, orthopnea, PND.   Patient denies any other complaints.  Past Medical History  Diagnosis Date  . Intraductal carcinoma 06/2003    Of right breast. s/p right partial mastectomy. // Followed by Dr. Marylene Buerger  . PVD (peripheral vascular disease)     S/P BL femoral-popliteal bypass surgery 03/2001 (right) and 01/2003 (left)  . Diabetes mellitus, type 2     Well controlled  . Hypertension   . Hyperlipidemia   . TB lung, latent     Treated with INH in 11/2007  . Arthritis     lt hip  . Thyroid disease     Graves disease  . Ulcer     gastric antral ulcer and AVMs  . Mesenteric ischemia   . Graves disease     HX OF GRAVES   Current Outpatient Prescriptions  Medication Sig Dispense Refill  . aspirin EC 81 MG tablet Take 81 mg by mouth daily.      Marland Kitchen atorvastatin (LIPITOR) 10 MG tablet Take 10 mg by mouth daily at 6 PM.       . atropine 1 % ophthalmic solution Place 1 drop into the right eye 2 (two) times daily.      . calcium citrate-vitamin D (CITRACAL+D) 315-200 MG-UNIT per tablet Take 2 tablets by mouth 2 (two) times daily.  120 tablet  11  . docusate sodium 100 MG CAPS Take 100  mg by mouth daily.  30 capsule  1  . dorzolamide-timolol (COSOPT) 22.3-6.8 MG/ML ophthalmic solution Place 1 drop into both eyes 2 (two) times daily.       . feeding supplement, RESOURCE BREEZE, (RESOURCE BREEZE) LIQD Take 1 Container by mouth 2 (two) times daily between meals.    0  . gentamicin (GARAMYCIN) 0.3 % ophthalmic solution Place 1 drop into the right eye 4 (four) times daily.      Marland Kitchen ketorolac (ACULAR) 0.5 % ophthalmic solution Place 1 drop into the right eye 2 (two) times daily.      Marland Kitchen latanoprost (XALATAN) 0.005 % ophthalmic solution Place 1 drop into both eyes at bedtime.      Marland Kitchen lisinopril-hydrochlorothiazide (PRINZIDE) 20-12.5 MG per tablet Take 1 tablet by mouth daily.  31 tablet  3  . methimazole (TAPAZOLE) 5 MG tablet TAKE 1 TABLET BY MOUTH TWICE DAILY WITH MEALS  60 tablet  0  . Multiple Vitamin (MULTIVITAMIN WITH MINERALS) TABS tablet Take 1 tablet by mouth daily.      . pantoprazole (PROTONIX) 40 MG tablet Take 40 mg by mouth daily.      . pilocarpine (PILOCAR) 1 % ophthalmic solution       . polyethylene glycol (MIRALAX / GLYCOLAX)  packet Take 17 g by mouth daily as needed for severe constipation.  14 each  0   No current facility-administered medications for this visit.   Family History  Problem Relation Age of Onset  . Cancer Daughter 22    uterine cancer  . Cancer Father    History   Social History  . Marital Status: Widowed    Spouse Name: N/A    Number of Children: N/A  . Years of Education: N/A   Occupational History  . nurse     Private duty nurse   Social History Main Topics  . Smoking status: Light Tobacco Smoker -- 0.20 packs/day for 64 years    Types: Cigarettes    Last Attempt to Quit: 02/28/2013  . Smokeless tobacco: Never Used     Comment: started back smoking.  . Alcohol Use: No  . Drug Use: No  . Sexual Activity: None   Other Topics Concern  . None   Social History Narrative   Lives alone with home health aid Monday-Friday   Review  of Systems: Pertinent items are noted in HPI. Objective:  Physical Exam: Filed Vitals:   03/17/14 1324  BP: 157/57  Pulse: 64  Temp: 98.2 F (36.8 C)  TempSrc: Oral  Height: 5\' 6"  (1.676 m)  Weight: 92 lb 14.4 oz (42.139 kg)  SpO2: 98%   Constitutional: Vital signs reviewed.  Patient is a very thin appearing, age appropriate looking african [place] woman , in no acute distress and cooperative with exam.   Eyes: Conjunctivae normal, No scleral icterus.   Cardiovascular: RRR, S1 normal, S2 normal, no MRG Pulmonary/Chest: normal respiratory effort, CTAB, no wheezes, rales, or rhonchi Extremities: 1+ edema noted from knees to half way down the legs. Trace pedal edema noted in both feet.  Neurological: A&O x3. Non-focal.  Assessment & Plan:

## 2014-03-17 NOTE — Assessment & Plan Note (Signed)
Patient states that the pain has improved significantly and requested to discontinue the fentanyl. Patient states that she will take Ibuprofen as needed for pain.

## 2014-03-17 NOTE — Patient Instructions (Signed)
Stop taking the Exforge (Amlodipine - Valsartan) tablet for your blood pressure. Start the new medication sent to your pharmacy for your blood pressure from tomorrow.

## 2014-03-17 NOTE — Progress Notes (Signed)
Case discussed with Dr. Boggala at the time of the visit.  We reviewed the resident's history and exam and pertinent patient test results.  I agree with the assessment, diagnosis, and plan of care documented in the resident's note. 

## 2014-03-23 ENCOUNTER — Telehealth: Payer: Self-pay

## 2014-03-23 NOTE — Telephone Encounter (Signed)
Pt called and would like to know if Dr. Cruzita Lederer could be the listed provider on her medicaid card.   Please advise pt. Thanks!

## 2014-03-23 NOTE — Telephone Encounter (Signed)
Please read note below and advise.  

## 2014-03-23 NOTE — Telephone Encounter (Signed)
Not as a PCP, but as an endocrinologist.

## 2014-03-24 ENCOUNTER — Ambulatory Visit (INDEPENDENT_AMBULATORY_CARE_PROVIDER_SITE_OTHER): Payer: PRIVATE HEALTH INSURANCE | Admitting: Internal Medicine

## 2014-03-24 ENCOUNTER — Encounter: Payer: Self-pay | Admitting: Internal Medicine

## 2014-03-24 ENCOUNTER — Other Ambulatory Visit (HOSPITAL_COMMUNITY): Payer: Self-pay | Admitting: Internal Medicine

## 2014-03-24 VITALS — BP 135/69 | HR 64 | Temp 98.1°F | Ht 66.0 in | Wt 83.1 lb

## 2014-03-24 DIAGNOSIS — S2232XA Fracture of one rib, left side, initial encounter for closed fracture: Secondary | ICD-10-CM

## 2014-03-24 DIAGNOSIS — X58XXXA Exposure to other specified factors, initial encounter: Secondary | ICD-10-CM

## 2014-03-24 DIAGNOSIS — S2239XA Fracture of one rib, unspecified side, initial encounter for closed fracture: Secondary | ICD-10-CM

## 2014-03-24 DIAGNOSIS — I10 Essential (primary) hypertension: Secondary | ICD-10-CM

## 2014-03-24 LAB — BASIC METABOLIC PANEL WITH GFR
BUN: 31 mg/dL — ABNORMAL HIGH (ref 6–23)
CO2: 33 mEq/L — ABNORMAL HIGH (ref 19–32)
Calcium: 9.8 mg/dL (ref 8.4–10.5)
Chloride: 104 mEq/L (ref 96–112)
Creat: 0.65 mg/dL (ref 0.50–1.10)
GFR, Est African American: >89 mL/min
GFR, Est Non African American: 83 mL/min
Glucose, Bld: 180 mg/dL — ABNORMAL HIGH (ref 70–99)
Potassium: 4.2 mEq/L (ref 3.5–5.3)
Sodium: 143 mEq/L (ref 135–145)

## 2014-03-24 MED ORDER — FENTANYL 12 MCG/HR TD PT72: 12.5000 ug | MEDICATED_PATCH | TRANSDERMAL | Status: DC

## 2014-03-24 NOTE — Progress Notes (Signed)
Subjective:   [name] female   DOB: 1928/10/13 80 y.o.   MRN: [id number]  HPI: [name] is a 80 y.o. a 80 y.o. woman with PMH significant for HTN, DM-II with an A1C 6.0 (april 2015), PVD s/p B/L bypass surgery, recently diagnosed mild graves disease currently on Methimazole therapy and scheduled to undergo RAI treatment comes to the office for a follow up of her BP management.  I saw [name] on 03/17/14 for isolated systolic HTN and discontinued her Amlodipine-Valasartan (Please see my A/P under HTN for further details) and started her on Lisinopril-HCTZ. Patient reports compliance to the new medication and reports that her swelling of the legs resolved. Patient reports doing well.  Patient denies any other complaints.  Past Medical History  Diagnosis Date  . Intraductal carcinoma 06/2003    Of right breast. s/p right partial mastectomy. // Followed by Dr. Marylene Buerger  . PVD (peripheral vascular disease)     S/P BL femoral-popliteal bypass surgery 03/2001 (right) and 01/2003 (left)  . Diabetes mellitus, type 2     Well controlled  . Hypertension   . Hyperlipidemia   . TB lung, latent     Treated with INH in 11/2007  . Arthritis     lt hip  . Thyroid disease     Graves disease  . Ulcer     gastric antral ulcer and AVMs  . Mesenteric ischemia   . Graves disease     HX OF GRAVES   Current Outpatient Prescriptions  Medication Sig Dispense Refill  . aspirin EC 81 MG tablet Take 81 mg by mouth daily.      Marland Kitchen atorvastatin (LIPITOR) 10 MG tablet Take 10 mg by mouth daily at 6 PM.       . atropine 1 % ophthalmic solution Place 1 drop into the right eye 2 (two) times daily.      . calcium citrate-vitamin D (CITRACAL+D) 315-200 MG-UNIT per tablet Take 2 tablets by mouth 2 (two) times daily.  120 tablet  11  . docusate sodium 100 MG CAPS Take 100 mg by mouth daily.  30 capsule  1  . dorzolamide-timolol (COSOPT) 22.3-6.8 MG/ML ophthalmic solution Place 1  drop into both eyes 2 (two) times daily.       . feeding supplement, RESOURCE BREEZE, (RESOURCE BREEZE) LIQD Take 1 Container by mouth 2 (two) times daily between meals.    0  . gentamicin (GARAMYCIN) 0.3 % ophthalmic solution Place 1 drop into the right eye 4 (four) times daily.      Marland Kitchen ketorolac (ACULAR) 0.5 % ophthalmic solution Place 1 drop into the right eye 2 (two) times daily.      Marland Kitchen latanoprost (XALATAN) 0.005 % ophthalmic solution Place 1 drop into both eyes at bedtime.      Marland Kitchen lisinopril-hydrochlorothiazide (PRINZIDE) 20-12.5 MG per tablet Take 1 tablet by mouth daily.  31 tablet  3  . methimazole (TAPAZOLE) 5 MG tablet TAKE 1 TABLET BY MOUTH TWICE DAILY WITH MEALS  60 tablet  0  . Multiple Vitamin (MULTIVITAMIN WITH MINERALS) TABS tablet Take 1 tablet by mouth daily.      . pantoprazole (PROTONIX) 40 MG tablet Take 40 mg by mouth daily.      . pilocarpine (PILOCAR) 1 % ophthalmic solution       . polyethylene glycol (MIRALAX / GLYCOLAX) packet Take 17 g by mouth daily as needed for severe constipation.  14 each  0  No current facility-administered medications for this visit.   Family History  Problem Relation Age of Onset  . Cancer Daughter 59    uterine cancer  . Cancer Father    History   Social History  . Marital Status: Widowed    Spouse Name: N/A    Number of Children: N/A  . Years of Education: N/A   Occupational History  . nurse     Private duty nurse   Social History Main Topics  . Smoking status: Light Tobacco Smoker -- 0.20 packs/day for 64 years    Types: Cigarettes    Last Attempt to Quit: 02/28/2013  . Smokeless tobacco: Never Used     Comment: started back smoking.  . Alcohol Use: No  . Drug Use: No  . Sexual Activity: None   Other Topics Concern  . None   Social History Narrative   Lives alone with home health aid Monday-Friday   Review of Systems: Pertinent items are noted in HPI. Objective:  Physical Exam: Filed Vitals:   03/24/14 1109    BP: 135/69  Pulse: 64  Temp: 98.1 F (36.7 C)  TempSrc: Oral  Height: 5\' 6"  (1.676 m)  Weight: 83 lb 1.6 oz (37.694 kg)  SpO2: 100%   Constitutional: Vital signs reviewed. Patient is a very thin appearing, age appropriate looking african [place] woman , in no acute distress and cooperative with exam.  Eyes: Conjunctivae normal, No scleral icterus.  Cardiovascular: RRR, S1 normal, S2 normal, no MRG  Pulmonary/Chest: normal respiratory effort, CTAB, no wheezes, rales, or rhonchi  Extremities: No edema in both lower extremities. Neurological: A&O x3.  Assessment & Plan:

## 2014-03-24 NOTE — Assessment & Plan Note (Addendum)
Patient requested to discontinue Fentanyl patch on 03/17/14 office visit and stated she would take OTC Ibuprofen, but now is asking for fentanyl patch for another month as ibuprofen is not helping her. Discussed with the attending.  Plans: Refill Fentayl 12 mcg/hr #10 (1 month supply) with no refills.

## 2014-03-24 NOTE — Assessment & Plan Note (Signed)
BP well controlled and at goal upon discontinuing Amlodipine- Valsartan and starting Lisinopril-HCTZ combination. Pedal edema resolved upon stopping the Amlodipine and starting the HCTZ. Discussed with the attending regarding further management.  Plans: Continue current regimen. Check BMP to monitor renal function and electrolytes.

## 2014-03-24 NOTE — Progress Notes (Signed)
Case discussed with Dr. Boggala at the time of the visit.  We reviewed the resident's history and exam and pertinent patient test results.  I agree with the assessment, diagnosis, and plan of care documented in the resident's note. 

## 2014-03-24 NOTE — Patient Instructions (Addendum)
Continue all the medications as recommended below. Follow up with your endocrinologist as scheduled. Start using the Fentanyl patch as recommended.

## 2014-03-25 ENCOUNTER — Other Ambulatory Visit: Payer: Self-pay | Admitting: Internal Medicine

## 2014-04-07 ENCOUNTER — Ambulatory Visit (INDEPENDENT_AMBULATORY_CARE_PROVIDER_SITE_OTHER): Payer: PRIVATE HEALTH INSURANCE | Admitting: Internal Medicine

## 2014-04-07 ENCOUNTER — Encounter: Payer: Self-pay | Admitting: Internal Medicine

## 2014-04-07 VITALS — BP 100/58 | HR 53 | Temp 97.6°F | Resp 12 | Wt 87.8 lb

## 2014-04-07 DIAGNOSIS — E0500 Thyrotoxicosis with diffuse goiter without thyrotoxic crisis or storm: Secondary | ICD-10-CM

## 2014-04-07 DIAGNOSIS — E041 Nontoxic single thyroid nodule: Secondary | ICD-10-CM

## 2014-04-07 LAB — T3, FREE: T3, Free: 2.2 pg/mL — ABNORMAL LOW (ref 2.3–4.2)

## 2014-04-07 LAB — T4, FREE: Free T4: 1.3 ng/dL (ref 0.60–1.60)

## 2014-04-07 LAB — TSH: TSH: 0.09 u[IU]/mL — ABNORMAL LOW (ref 0.35–4.50)

## 2014-04-07 NOTE — Progress Notes (Signed)
[name], female   DOB: 01-22-1929, 80 y.o.   MRN: [id number]   HPI  Gina [name] is a 80 y.o.-year-old female, initially referred by her PCP, Dr. Benson Norway, for evaluation for thyrotoxycosis [name] ds). Last visit 2.5 mo ago.  Reviewed hx: She has had a goiter since 80 y/o, however she did not have thyroid hormone dysfunction until recently. She describes onset of left-sided AP this summer >> referred to Dr Benson Norway >> he checked TFTs and they revealed hyperthyroidism. She continues to have AP issues other investigations for this. An EGD performed on 05/07/2013 showed 2 gastric AVMs (s/p APC) and a nonbleeding healing antral ulcer, per records from Dr. Benson Norway. A CT of the abdomen and pelvis did not show an evident cause for her pain. Previous colonoscopy in 2010 was normal.  She was admitted 01/25/2014 with AP and found to have a low TSH and high free T4. She admitted to not been taking the MMI 5 mg in am for 1 mo. I d/w the resident and we restarted MMI 5 mg bid before d/c.   I reviewed pt's thyroid tests: Lab Results  Component Value Date   TSH 0.014* 01/25/2014   TSH 0.29* 11/22/2013   TSH 0.03* 10/15/2013   TSH 0.00* 09/06/2013   TSH <0.008* 05/06/2013   TSH 0.012* 01/28/2013   FREET4 2.91* 01/25/2014   FREET4 0.69 11/22/2013   FREET4 1.05 10/15/2013   FREET4 3.32* 09/06/2013   FREET4 3.60* 05/06/2013   FREET4 1.51 01/28/2013    An uptake and scan showed uniform uptake within the thyroid gland and no nodules - 24 hour I 131 uptake = 32.2% (normal 10-30%), c/w mild Graves disease.  A thyroid U/S showed several possible small nodules, not clearly defined.  We started MMI 5 mg bid >> TFTs improved >> gained 7 lbs and also felt better and her swallowing was better. We then decreased the dose to 5 mg in am, but restarted the 5 mg bid after last admission as sher TFTs were again abnormal (non-compliance). We also scheduled a RAI Tx (not done yet, but has it on 04/13/14, although ordered  02/03/2014!). She remains on MMI 5 mg bid.  Pt feels nodules in neck, no hoarseness, + dysphagia/no odynophagia.  She c/o: - + severe weight loss 136 lbs in 02/2013 >> 85 lbs >> 87 lbs  - no excessive sweating/heat intolerance; no cold intolerance - no more tremors - no anxiety - no palpitations - no fatigue - no hyperdefecation; + constipation - + weakness, + dizziness - believes from Fentanyl patch  I reviewed her chart and she also has a history of DM 2, CAD - she apparently had an MI in 01/2013. A 2-D echo showed an EF of 60% in 2009., PVD, HL, HTN, emphysema, DDD, protein-caloric malnutrition.  ROS: Constitutional: see history of present illness, + nocturia Eyes: no blurry vision, no xerophthalmia ENT: see history of present illness, decreased hearing Cardiovascular:no CP/SOB/no palpitations/leg swelling Respiratory: no cough/SOB Gastrointestinal: no N/V/D/+ C Musculoskeletal: + muscle aches/no joint aches Skin: no rashes Neurological: no tremors/numbness/tingling/dizziness  PE: BP 100/58  Pulse 53  Temp(Src) 97.6 F (36.4 C) (Oral)  Resp 12  Wt 87 lb 12.8 oz (39.826 kg)  SpO2 98% Wt Readings from Last 3 Encounters:  04/07/14 87 lb 12.8 oz (39.826 kg)  03/24/14 83 lb 1.6 oz (37.694 kg)  03/17/14 92 lb 14.4 oz (42.139 kg)   Constitutional: overweight, in NAD, cachectic appearance, temporal wasting Eyes:  PERRLA, EOMI, no exophthalmos, no lid lag, no stare ENT: moist mucous membranes,  lumpy bumpy thyroid, no cervical lymphadenopathy Cardiovascular: RRR, No MRG Respiratory: CTA B Gastrointestinal: abdomen soft, NT, ND, BS+ Musculoskeletal: no deformities, strength intact in all 4 Skin: moist, warm, no rashes Neurological: no tremor with outstretched hands, DTR normal in all 4  ASSESSMENT: 1. Graves ds.  - Thyroid uptake and scan (10/05/2013): There is uniform uptake within the thyroid gland which is mildly  enlarged. No nodularity. 24 hour I 131 uptake = 32.2%  (normal 10-30%)  IMPRESSION: Thyroid imaging and uptake suggests mild Graves disease   - thyroid ultrasound (03/17/2013):  Right thyroid lobe: 5.0 x 2.7 x 2.0 cm   Left thyroid lobe: 4.7 x 2.7 x 1.5 cm   Isthmus: 7.0 mm in thickness in the midline   Focal nodules: The thyroid gland is diffusely heterogeneous. A 1.2 x 1.1 x 1.0 cm rounded, heterogeneous, predominately solid nodule in the mid right lobe, laterally. Suggestion of a 1.5 x 1.3 cm nodule in the mid to inferior left lobe at the level of the isthmus in the transverse plane, not visualized as a discrete nodule in the sagittal plane. Suggestion of a 1.7 x 1.5 x 0.7 cm oval, heterogeneous, predominately solid nodule in the isthmus centered to the right of midline. A 5 mm dense calcification in the isthmus slightly more inferiorly on the right without a well-defined mass separate from the calcification on the images.   Lymphadenopathy: None visualized.  IMPRESSION:  Multinodular goiter, as described above. The only definite discrete, confirmed nodule is in the lateral aspect of the mid right lobe, measuring 1.2 cm in maximum diameter.  - DEXA scan (07/08/2012): Normal  - lumbar spine x-ray (05/06/2013): compression fracture in the superior L1 endplate and [id number] anterior height loss  PLAN:  1. Patient with Graves ds, with previous thyrotoxic sxs: severe weight loss, tremors, now almost entirely resolved (except weight loss) on MMI, but we need definitive tx for her thyrotoxicosis >> will have RAI Tx next week and I will see her back ~ 1 mo after this - continue 5 mg MMI 2x a day for now >> advised to stop 4 days prior to RAI tx - will check TFTs today - RTC in 1 mo  Office Visit on 04/07/2014  Component Date Value Ref Range Status  . TSH 04/07/2014 0.09* 0.35 - 4.50 uIU/mL Final  . Free T4 04/07/2014 1.30  0.60 - 1.60 ng/dL Final  . T3, Free 04/07/2014 2.2* 2.3 - 4.2 pg/mL Final   TFTs much better >> see plan above.

## 2014-04-07 NOTE — Patient Instructions (Signed)
Please stop at the lab. Please keep the appointment in nuclear medicine for the thyroid radioactive thyroid ablation. Please come back for a follow-up appointment in 1 month.

## 2014-04-13 ENCOUNTER — Encounter (HOSPITAL_COMMUNITY)
Admission: RE | Admit: 2014-04-13 | Discharge: 2014-04-13 | Disposition: A | Discharge status: Home / Self Care | Payer: PRIVATE HEALTH INSURANCE | Source: Ambulatory Visit | Attending: Internal Medicine | Admitting: Internal Medicine

## 2014-04-13 DIAGNOSIS — E0590 Thyrotoxicosis, unspecified without thyrotoxic crisis or storm: Secondary | ICD-10-CM | POA: Insufficient documentation

## 2014-04-13 MED ORDER — SODIUM IODIDE I 131 CAPSULE
21.5000 | Freq: Once | INTRAVENOUS | Status: AC
  Administered 2014-04-13: 21.5 via ORAL

## 2014-04-18 ENCOUNTER — Other Ambulatory Visit: Payer: Self-pay | Admitting: *Deleted

## 2014-04-18 ENCOUNTER — Other Ambulatory Visit: Payer: Medicaid Other

## 2014-04-18 DIAGNOSIS — S2232XA Fracture of one rib, left side, initial encounter for closed fracture: Secondary | ICD-10-CM

## 2014-04-18 NOTE — Telephone Encounter (Signed)
Pt presented for a lab visit, no orders, she did see dr Cruzita Lederer this am and had lab draw there, reviewed chart, no phone calls recently. Pt states she rec'd a reminder call Friday for this am, i will assume that it was for dr gherghe's office. Will cancel visit today

## 2014-04-19 ENCOUNTER — Other Ambulatory Visit (INDEPENDENT_AMBULATORY_CARE_PROVIDER_SITE_OTHER): Payer: PRIVATE HEALTH INSURANCE

## 2014-04-19 DIAGNOSIS — E0500 Thyrotoxicosis with diffuse goiter without thyrotoxic crisis or storm: Secondary | ICD-10-CM

## 2014-04-19 LAB — TSH: TSH: 0.03 u[IU]/mL — ABNORMAL LOW (ref 0.35–4.50)

## 2014-04-19 LAB — T3, FREE: T3, Free: 2.9 pg/mL (ref 2.3–4.2)

## 2014-04-19 LAB — T4, FREE: Free T4: 2.36 ng/dL — ABNORMAL HIGH (ref 0.60–1.60)

## 2014-04-20 ENCOUNTER — Other Ambulatory Visit: Payer: Self-pay | Admitting: Internal Medicine

## 2014-04-20 MED ORDER — TRAMADOL HCL 50 MG PO TABS: 100.0000 mg | ORAL_TABLET | Freq: Two times a day (BID) | ORAL | Status: DC

## 2014-04-20 MED ORDER — POLYETHYLENE GLYCOL 3350 17 G PO PACK: 17.0000 g | PACK | Freq: Every day | ORAL | Status: DC | PRN

## 2014-04-20 NOTE — Telephone Encounter (Signed)
Dr Denton Brick, i started to call the tramadol in but then i saw you prescribed only 30 with total of 4 daily, would you like to prescribe more? This is only a 7 day script. Please advise

## 2014-04-20 NOTE — Progress Notes (Signed)
Pt called for refill of Fentanyl patch for rib fracture, seen on Xray- 12/2013. Pt with persistent rib pain for over 10 weeks . Chart review initial concern for pathologic fracture as Dexa scan- 2013 showed Osteopenia. Rib fracture typically should heal within 6 weeks. Pt says she needs anything for pain. Talked to pt, No hx of seizures so prescribed Tramadol instead, and recommended pt needs to come in for re-evaluation and possible repeat chest xray,also need for pain meds. Pt agreed to plan. Pt says she will prefer an appointment on Monday morning.  Bing Neighbors, MD.

## 2014-04-20 NOTE — Progress Notes (Signed)
Pt called asking for refill of Fentanyl patches which she is taking for rib fracture, seen on Xray 12/2013. Initial concern for pathologic fractures, Dexa scan- 2013 showed osteopenia, T score-  -1.4. Recs on DEXA were for repeat Dexa in Oct 2015. Pt later reported she previosly had a fall, possibly sustaining rib fracture.  Rib fractures typically take 6 weeks to heal. Pt with persistent pain >10weeks. Concern for another fracture, non healing, PNA- due to splinting and athelectasis. Recommended pt come in for re-evaluation and possibly repeat imaging, just to be sure we are not missing anything. Called in tramadol instead of fentanyl, tramadol 100mg  BID. Pt denied hx of seizures. Hx of GI bleed so no NSAIDS. Talked to patient, she voiced understanding and agreed to plan. Will prefer to come in Monday morning for appointment.   Bing Neighbors, MD.

## 2014-04-21 ENCOUNTER — Telehealth: Payer: Self-pay | Admitting: Internal Medicine

## 2014-04-21 NOTE — Telephone Encounter (Signed)
Rx called in. One week supply is correct per Dr.

## 2014-04-21 NOTE — Telephone Encounter (Signed)
Pt called requesting refill of fentanyl patches for persistent rib pain, seen on Chest xray- 12/2013. Called pt about coming in for re-evaluation, as she has been having pain for >10 weeks, and rib fractures typically heal within 6 weeks. As per documentation, there was initial concern for Pathologic fractures, but pt later said she might have fallen. Dexa scan- 2013- Showed Osteopenia, and recs were for repeat Scan in Oct 2015. Pt might require repeat imaging to rule out PNA Vs another fracture? Also to still consider Pathologic fracture. Pt agreed to come in. In the main time will send in a prescription for 1 week of Tramadol, 100mg  BID, as no hx of Seizures, also pt has a hx of GIB so no NSAIDS.  Bing Neighbors, MD.

## 2014-04-22 ENCOUNTER — Other Ambulatory Visit (HOSPITAL_COMMUNITY): Payer: Self-pay | Admitting: Internal Medicine

## 2014-04-22 ENCOUNTER — Telehealth: Payer: Self-pay | Admitting: Internal Medicine

## 2014-04-22 ENCOUNTER — Other Ambulatory Visit: Payer: Self-pay | Admitting: Internal Medicine

## 2014-04-22 NOTE — Telephone Encounter (Signed)
It appears this medication is being refilled by Dr. Cruzita Lederer, would be advisable for pt to contact her for refill.

## 2014-04-22 NOTE — Progress Notes (Signed)
i attempted to call this pt wed and got no answer

## 2014-04-25 ENCOUNTER — Encounter: Payer: Self-pay | Admitting: Internal Medicine

## 2014-04-25 ENCOUNTER — Ambulatory Visit (INDEPENDENT_AMBULATORY_CARE_PROVIDER_SITE_OTHER): Payer: PRIVATE HEALTH INSURANCE | Admitting: Internal Medicine

## 2014-04-25 VITALS — BP 119/54 | HR 55 | Temp 96.5°F | Ht 67.0 in | Wt 79.2 lb

## 2014-04-25 DIAGNOSIS — K5900 Constipation, unspecified: Secondary | ICD-10-CM

## 2014-04-25 DIAGNOSIS — S2232XS Fracture of one rib, left side, sequela: Secondary | ICD-10-CM

## 2014-04-25 DIAGNOSIS — IMO0002 Reserved for concepts with insufficient information to code with codable children: Secondary | ICD-10-CM

## 2014-04-25 DIAGNOSIS — Z720 Tobacco use: Secondary | ICD-10-CM

## 2014-04-25 DIAGNOSIS — E119 Type 2 diabetes mellitus without complications: Secondary | ICD-10-CM

## 2014-04-25 DIAGNOSIS — F17200 Nicotine dependence, unspecified, uncomplicated: Secondary | ICD-10-CM

## 2014-04-25 LAB — CBC WITH DIFFERENTIAL/PLATELET
Basophils Absolute: 0 10*3/uL (ref 0.0–0.1)
Basophils Relative: 1 % (ref 0–1)
Eosinophils Absolute: 0.1 10*3/uL (ref 0.0–0.7)
Eosinophils Relative: 2 % (ref 0–5)
HCT: 32 % — ABNORMAL LOW (ref 36.0–46.0)
Hemoglobin: 11 g/dL — ABNORMAL LOW (ref 12.0–15.0)
Lymphocytes Relative: 24 % (ref 12–46)
Lymphs Abs: 1.1 10*3/uL (ref 0.7–4.0)
MCH: 32.6 pg (ref 26.0–34.0)
MCHC: 34.4 g/dL (ref 30.0–36.0)
MCV: 95 fL (ref 78.0–100.0)
Monocytes Absolute: 0.3 10*3/uL (ref 0.1–1.0)
Monocytes Relative: 7 % (ref 3–12)
Neutro Abs: 3 10*3/uL (ref 1.7–7.7)
Neutrophils Relative %: 66 % (ref 43–77)
Platelets: 193 10*3/uL (ref 150–400)
RBC: 3.37 MIL/uL — ABNORMAL LOW (ref 3.87–5.11)
RDW: 12.9 % (ref 11.5–15.5)
WBC: 4.6 10*3/uL (ref 4.0–10.5)

## 2014-04-25 LAB — COMPLETE METABOLIC PANEL WITH GFR
ALT: 10 U/L (ref 0–35)
AST: 12 U/L (ref 0–37)
Albumin: 3.9 g/dL (ref 3.5–5.2)
Alkaline Phosphatase: 56 U/L (ref 39–117)
BUN: 39 mg/dL — ABNORMAL HIGH (ref 6–23)
CO2: 27 mEq/L (ref 19–32)
Calcium: 9.7 mg/dL (ref 8.4–10.5)
Chloride: 102 mEq/L (ref 96–112)
Creat: 0.89 mg/dL (ref 0.50–1.10)
GFR, Est African American: 70 mL/min
GFR, Est Non African American: 61 mL/min
Glucose, Bld: 113 mg/dL — ABNORMAL HIGH (ref 70–99)
Potassium: 4.1 mEq/L (ref 3.5–5.3)
Sodium: 140 mEq/L (ref 135–145)
Total Bilirubin: 0.4 mg/dL (ref 0.2–1.2)
Total Protein: 6 g/dL (ref 6.0–8.3)

## 2014-04-25 LAB — POCT GLYCOSYLATED HEMOGLOBIN (HGB A1C): Hemoglobin A1C: 5.4

## 2014-04-25 LAB — GLUCOSE, CAPILLARY: Glucose-Capillary: 80 mg/dL (ref 70–99)

## 2014-04-25 MED ORDER — DSS 100 MG PO CAPS: 100.0000 mg | ORAL_CAPSULE | Freq: Every day | ORAL | Status: DC

## 2014-04-25 MED ORDER — POLYETHYLENE GLYCOL 3350 17 G PO PACK: 17.0000 g | PACK | Freq: Every day | ORAL | Status: DC | PRN

## 2014-04-25 NOTE — Assessment & Plan Note (Signed)
Rx refill of Miralax and Colace

## 2014-04-25 NOTE — Assessment & Plan Note (Signed)
Counseled on smoking cessation  

## 2014-04-25 NOTE — Assessment & Plan Note (Addendum)
Patient is having pain below left rib cage.  Will w/u for complications of rib fracture with CXR (2 view) to r/o pneumonia, pulm contusion, injury to organs in that area, pneuthorax (doubt as pt is not having sob). Doubt pain related to PE, cardiac Will do 2 view Ab xray (upright and supine) since pain is below rib cage  Will check CMET, CBC, UA today  If imaging negative will order CT abdomen/pelvis  Continue Tramadol 100 mg bid prn RTC in 1-3 months, sooner if needed

## 2014-04-25 NOTE — Progress Notes (Signed)
Subjective:    [name], female    DOB: 04-12-1929, 80 y.o.   MRN: [id number]  HPI Comments: 80 y.o female PMH DM 2 (6.0 01/25/14), DDD, HLD, HTN, PVD, latent TB, tobacco abuse, h/o thyroid nodule, Graves disease, osteopenia borderline (06/2012 DEXA), h/o benign colon polyps (2010), h/o ductal carcinoma in situ right breast   She presents for:  1) left 10 rib fracture with Rib pain (noted since 01/26/2014)-phoned in over the weekend for Rx refills of Fentanyl patches which helped but was given Tramadol 100 mg bid x 1 week supply due to concern for the fractures not healing and pt still being in pain.  Initially pt was given Fentanyl 12 mcg patches x 1 month supply 03/24/14 (removed last patch Saturday prior to visit) as OTC NSAIDs did not provide relief of pain. She states she fell out of bed and tub 12/2013 when had rib fracture but no falls since.  She said pain in left upper quadrant feels like gas is gathering and hurts 10/10 when present. When present pain is constant relieved currently by Tramadol 100 mg bid but intermittent when on pain medication.  Pain lasts a while and was bad Thurs. And Friday prior to visit.  Pain is worse with movement and better with previous Fentanyl and now Tramadol.    Pain does not radiate.  2) DM 2-repeat HA1C today 5.4  3) Needs Rx refills of Colace and Miralax   SH: smoker 1 pk cig last 4 days smoking since age 42. 56 living son daughter died in the 36s     Review of Systems  Constitutional: Positive for appetite change. Negative for fever and chills.       Decreased appetite for a while (comes and goes) tries to drink Ensure but out of Strawberry flavor she likes.   HENT:       Dysphagia improved since undergoing tx for thyroid   Respiratory: Positive for cough. Negative for shortness of breath.        Denies coughing blood. Coughs up grady mucous thinks related to [facility]  Cardiovascular: Negative for chest pain.  Gastrointestinal:  Negative for nausea, vomiting, abdominal pain and blood in stool.       Last stool was Saturday       Objective:   Physical Exam  Nursing note and vitals reviewed. Constitutional: She is oriented to person, place, and time. Vital signs are normal. She appears well-developed and well-nourished. She is cooperative. No distress.  Emaciated on exam  HENT:  Head: Normocephalic and atraumatic.  Mouth/Throat: Oropharynx is clear and moist and mucous membranes are normal. No oropharyngeal exudate.  Eyes: Conjunctivae are normal. Pupils are equal, round, and reactive to light. Right eye exhibits no discharge. Left eye exhibits no discharge. No scleral icterus.  Cardiovascular: Normal rate, regular rhythm, S1 normal, S2 normal and normal heart sounds.   No murmur heard. No lower ext edema   Pulmonary/Chest: Effort normal and breath sounds normal. No respiratory distress. She has no wheezes.  Abdominal: Soft. Bowel sounds are normal. There is tenderness.    Neurological: She is alert and oriented to person, place, and time.  In wheelchair   Skin: Skin is warm, dry and intact. No rash noted. She is not diaphoretic.  Psychiatric: She has a normal mood and affect. Her speech is normal and behavior is normal. Judgment and thought content normal. Cognition and memory are normal.  Assessment & Plan:  1-3 months, sooner if needed

## 2014-04-25 NOTE — Assessment & Plan Note (Signed)
Lab Results  Component Value Date   HGBA1C 5.4 04/25/2014   HGBA1C 6.0 01/25/2014   HGBA1C 6.2 01/28/2013     Assessment: Diabetes control: good control (HgbA1C at goal) Progress toward A1C goal:  at goal Comments: none  Plan: Medications:  none Home glucose monitoring: Frequency: no home glucose monitoring Timing: N/A Instruction/counseling given: no instruction/counseling  Other plans:f/u with PCP in 3 months

## 2014-04-25 NOTE — Patient Instructions (Addendum)
General Instructions: Please follow up in 1-3 months, sooner if needed  Please come back and get imaging done tomorrow.  If imaging negative will have you do further imaging I will let you know about your labs Take care    Treatment Goals:  Goals (1 Years of Data) as of 04/25/14         01/25/14 01/28/13 07/02/12     Result Component    . HEMOGLOBIN A1C < 8  6.0 6.2     . LDL CALC < 100    77      Progress Toward Treatment Goals:  Treatment Goal 04/25/2014  Hemoglobin A1C at goal  Blood pressure at goal  Stop smoking -  Prevent falls -    Self Care Goals & Plans:  Self Care Goal 04/25/2014  Manage my medications take my medicines as prescribed; bring my medications to every visit; refill my medications on time  Monitor my health keep track of my blood pressure  Eat healthy foods drink diet soda or water instead of juice or soda; eat more vegetables; eat foods that are low in salt; eat baked foods instead of fried foods; eat fruit for snacks and desserts; eat smaller portions  Be physically active find an activity I enjoy  Stop smoking call [facility] (1-800-QUIT-NOW)  Prevent falls -  Meeting treatment goals maintain the current self-care plan    Home Blood Glucose Monitoring 04/25/2014  Check my blood sugar no home glucose monitoring  When to check my blood sugar N/A     Care Management & Community Referrals:  Referral 04/25/2014  Referrals made for care management support none needed  Referrals made to community resources none       Rib Fracture A rib fracture is a break or crack in one of the bones of the ribs. The ribs are a group of long, curved bones that wrap around your chest and attach to your spine. They protect your lungs and other organs in the chest cavity. A broken or cracked rib is often painful, but most do not cause other problems. Most rib fractures heal on their own over time. However, rib fractures can be more serious if multiple ribs are broken or if  broken ribs move out of place and push against other structures. CAUSES   A direct blow to the chest. For example, this could happen during contact sports, a car accident, or a fall against a hard object.  Repetitive movements with high force, such as pitching a baseball or having severe coughing spells. SYMPTOMS   Pain when you breathe in or cough.  Pain when someone presses on the injured area. DIAGNOSIS  Your caregiver will perform a physical exam. Various imaging tests may be ordered to confirm the diagnosis and to look for related injuries. These tests may include a chest X-ray, computed tomography (CT), magnetic resonance imaging (MRI), or a bone scan. TREATMENT  Rib fractures usually heal on their own in 1-3 months. The longer healing period is often associated with a continued cough or other aggravating activities. During the healing period, pain control is very important. Medication is usually given to control pain. Hospitalization or surgery may be needed for more severe injuries, such as those in which multiple ribs are broken or the ribs have moved out of place.  HOME CARE INSTRUCTIONS   Avoid strenuous activity and any activities or movements that cause pain. Be careful during activities and avoid bumping the injured rib.  Gradually increase activity  as directed by your caregiver.  Only take over-the-counter or prescription medications as directed by your caregiver. Do not take other medications without asking your caregiver first.  Apply ice to the injured area for the first 1-2 days after you have been treated or as directed by your caregiver. Applying ice helps to reduce inflammation and pain.  Put ice in a plastic bag.  Place a towel between your skin and the bag.   Leave the ice on for 15-20 minutes at a time, every 2 hours while you are awake.  Perform deep breathing as directed by your caregiver. This will help prevent pneumonia, which is a common complication of a  broken rib. Your caregiver may instruct you to:  Take deep breaths several times a day.  Try to cough several times a day, holding a pillow against the injured area.  Use a device called an incentive spirometer to practice deep breathing several times a day.  Drink enough fluids to keep your urine clear or pale yellow. This will help you avoid constipation.   Do not wear a rib belt or binder. These restrict breathing, which can lead to pneumonia.  SEEK IMMEDIATE MEDICAL CARE IF:   You have a fever.   You have difficulty breathing or shortness of breath.   You develop a continual cough, or you cough up thick or bloody sputum.  You feel sick to your stomach (nausea), throw up (vomit), or have abdominal pain.   You have worsening pain not controlled with medications.  MAKE SURE YOU:  Understand these instructions.  Will watch your condition.  Will get help right away if you are not doing well or get worse. Document Released: 09/16/2005 Document Revised: 05/19/2013 Document Reviewed: 11/18/2012 [facility] Patient Information 2015 [place], [place]. This information is not intended to replace advice given to you by your health care provider. Make sure you discuss any questions you have with your health care provider.

## 2014-04-26 ENCOUNTER — Encounter: Payer: Self-pay | Admitting: Internal Medicine

## 2014-04-26 ENCOUNTER — Ambulatory Visit (HOSPITAL_COMMUNITY)
Admission: RE | Admit: 2014-04-26 | Discharge: 2014-04-26 | Disposition: A | Discharge status: Home / Self Care | Payer: PRIVATE HEALTH INSURANCE | Source: Ambulatory Visit | Attending: Internal Medicine | Admitting: Internal Medicine

## 2014-04-26 ENCOUNTER — Other Ambulatory Visit: Payer: Self-pay | Admitting: Internal Medicine

## 2014-04-26 DIAGNOSIS — X58XXXS Exposure to other specified factors, sequela: Secondary | ICD-10-CM | POA: Diagnosis not present

## 2014-04-26 DIAGNOSIS — R1012 Left upper quadrant pain: Secondary | ICD-10-CM | POA: Insufficient documentation

## 2014-04-26 DIAGNOSIS — S2232XS Fracture of one rib, left side, sequela: Secondary | ICD-10-CM

## 2014-04-26 DIAGNOSIS — IMO0002 Reserved for concepts with insufficient information to code with codable children: Secondary | ICD-10-CM | POA: Insufficient documentation

## 2014-04-26 DIAGNOSIS — I7090 Unspecified atherosclerosis: Secondary | ICD-10-CM | POA: Diagnosis not present

## 2014-04-26 DIAGNOSIS — J438 Other emphysema: Secondary | ICD-10-CM | POA: Insufficient documentation

## 2014-04-26 DIAGNOSIS — R109 Unspecified abdominal pain: Secondary | ICD-10-CM | POA: Diagnosis present

## 2014-04-26 LAB — URINALYSIS, ROUTINE W REFLEX MICROSCOPIC
Bilirubin Urine: NEGATIVE
Glucose, UA: NEGATIVE mg/dL
Hgb urine dipstick: NEGATIVE
Ketones, ur: NEGATIVE mg/dL
Leukocytes, UA: NEGATIVE
Nitrite: NEGATIVE
Protein, ur: NEGATIVE mg/dL
Specific Gravity, Urine: 1.016 (ref 1.005–1.030)
Urobilinogen, UA: 0.2 mg/dL (ref 0.0–1.0)
pH: 5 (ref 5.0–8.0)

## 2014-05-09 ENCOUNTER — Other Ambulatory Visit: Payer: Self-pay | Admitting: *Deleted

## 2014-05-10 MED ORDER — ATORVASTATIN CALCIUM 10 MG PO TABS: 10.0000 mg | ORAL_TABLET | Freq: Every day | ORAL | Status: DC

## 2014-05-11 ENCOUNTER — Telehealth: Payer: Self-pay | Admitting: *Deleted

## 2014-05-11 NOTE — Telephone Encounter (Signed)
Call to pt to notify her of her CT Scan appointment on 05/12/2014 at 1:00 PM.  Explained to patient need for contrast to drink and option of coming in today to pick up contrast in Xray or  coming in on 05/12/2014 at 10:45 AM for the prep.  Pt to come in today and do the contrast at home prior to the CT.  Xray was contacted and informed of.  Sander Nephew, RN 05/11/2014 9:04 AM

## 2014-05-12 ENCOUNTER — Encounter (HOSPITAL_COMMUNITY): Payer: Self-pay

## 2014-05-12 ENCOUNTER — Ambulatory Visit (HOSPITAL_COMMUNITY)
Admission: RE | Admit: 2014-05-12 | Discharge: 2014-05-12 | Disposition: A | Discharge status: Home / Self Care | Payer: PRIVATE HEALTH INSURANCE | Source: Ambulatory Visit | Attending: Internal Medicine | Admitting: Internal Medicine

## 2014-05-12 DIAGNOSIS — R109 Unspecified abdominal pain: Secondary | ICD-10-CM | POA: Diagnosis present

## 2014-05-12 DIAGNOSIS — M8448XA Pathological fracture, other site, initial encounter for fracture: Secondary | ICD-10-CM | POA: Insufficient documentation

## 2014-05-12 DIAGNOSIS — N289 Disorder of kidney and ureter, unspecified: Secondary | ICD-10-CM | POA: Insufficient documentation

## 2014-05-12 DIAGNOSIS — IMO0001 Reserved for inherently not codable concepts without codable children: Secondary | ICD-10-CM | POA: Insufficient documentation

## 2014-05-12 DIAGNOSIS — IMO0002 Reserved for concepts with insufficient information to code with codable children: Secondary | ICD-10-CM | POA: Diagnosis present

## 2014-05-12 DIAGNOSIS — S2232XS Fracture of one rib, left side, sequela: Secondary | ICD-10-CM

## 2014-05-12 MED ORDER — IOHEXOL 300 MG/ML  SOLN
80.0000 mL | Freq: Once | INTRAMUSCULAR | Status: AC | PRN
  Administered 2014-05-12: 80 mL via INTRAVENOUS

## 2014-05-13 ENCOUNTER — Ambulatory Visit (INDEPENDENT_AMBULATORY_CARE_PROVIDER_SITE_OTHER): Payer: PRIVATE HEALTH INSURANCE | Admitting: Internal Medicine

## 2014-05-13 ENCOUNTER — Encounter: Payer: Self-pay | Admitting: Internal Medicine

## 2014-05-13 VITALS — BP 102/58 | HR 83 | Temp 97.7°F | Resp 16 | Wt 88.0 lb

## 2014-05-13 DIAGNOSIS — E0500 Thyrotoxicosis with diffuse goiter without thyrotoxic crisis or storm: Secondary | ICD-10-CM

## 2014-05-13 NOTE — Patient Instructions (Signed)
Please stop Methimazole. Please come back in 1 month for thyroid labs. Please come back for a follow-up appointment in 3 months.

## 2014-05-13 NOTE — Progress Notes (Signed)
[name], female   DOB: May 22, 1929, 80 y.o.   MRN: [id number]   HPI  Gina [name] is a 80 y.o.-year-old female, initially referred by her PCP, Dr. Benson Norway, for evaluation for thyrotoxycosis [name] ds). Last visit 1 mo ago.  Reviewed hx: She has had a goiter since 80 y/o, however she did not have thyroid hormone dysfunction until recently. She describes onset of left-sided AP this summer >> referred to Dr Benson Norway >> he checked TFTs and they revealed hyperthyroidism. She continues to have AP issues other investigations for this. An EGD performed on 05/07/2013 showed 2 gastric AVMs (s/p APC) and a nonbleeding healing antral ulcer, per records from Dr. Benson Norway. A CT of the abdomen and pelvis did not show an evident cause for her pain. Previous colonoscopy in 2010 was normal.  She was admitted 01/25/2014 with AP and found to have a low TSH and high free T4. She admitted to not been taking the MMI 5 mg in am for 1 mo. I d/w the resident and we restarted MMI 5 mg bid before d/c.   I reviewed pt's thyroid tests: Lab Results  Component Value Date   TSH 0.03* 04/19/2014   TSH 0.09* 04/07/2014   TSH 0.014* 01/25/2014   TSH 0.29* 11/22/2013   TSH 0.03* 10/15/2013   TSH 0.00* 09/06/2013   TSH <0.008* 05/06/2013   TSH 0.012* 01/28/2013   FREET4 2.36* 04/19/2014   FREET4 1.30 04/07/2014   FREET4 2.91* 01/25/2014   FREET4 0.69 11/22/2013   FREET4 1.05 10/15/2013   FREET4 3.32* 09/06/2013   FREET4 3.60* 05/06/2013   FREET4 1.51 01/28/2013    An uptake and scan showed uniform uptake within the thyroid gland and no nodules - 24 hour I 131 uptake = 32.2% (normal 10-30%), c/w mild Graves disease.  A thyroid U/S showed several possible small nodules, not clearly defined (largest 1.2 cm). Pt feels nodules in neck, no hoarseness, + dysphagia - improved/no odynophagia.  We started MMI 5 mg bid >> TFTs improved >> gained 7 lbs and also felt better and her swallowing was better. We then decreased the dose to 5 mg  in am, but had to restart the 5 mg bid after last admission as sher TFTs were again abnormal (non-compliance).  She had RAI Tx 04/13/2014. She is still on Methimazole after the tx as she did not understand that she needs to stop!  She had a contrasted CT scan yesterday for her chronic abdominal pain.  She relates: - + weight gain - She gained 9 lbs since last visit, which is great!  - + improved appetite - no excessive sweating/heat intolerance; + cold intolerance - no more tremors - no anxiety - no palpitations - no fatigue - + diarrhea after the Ct scan yesterday - + AP - L sided - continues - she ran out of Fentanyl patches.  I reviewed her chart and she also has a history of DM 2, CAD - she apparently had an MI in 01/2013. A 2-D echo showed an EF of 60% in 2009., PVD, HL, HTN, emphysema, DDD, protein-caloric malnutrition.  ROS: Constitutional: see HPI, + nocturia Eyes: + blurry vision, no xerophthalmia ENT: see HPI, + decreased hearing Cardiovascular:no CP/SOB/no palpitations/leg swelling Respiratory: no cough/SOB Gastrointestinal: no N/V/+D/C Musculoskeletal: + muscle aches/no joint aches Skin: no rashes, + hair loss Neurological: no tremors/numbness/tingling/dizziness  I reviewed pt's medications, allergies, PMH, social hx, family hx and no changes required, except as mentioned above.  PE:  BP 102/58  Pulse 83  Temp(Src) 97.7 F (36.5 C) (Oral)  Resp 16  Wt 88 lb (39.917 kg)  SpO2 95% Body mass index is 13.78 kg/(m^2).  Wt Readings from Last 3 Encounters:  05/13/14 88 lb (39.917 kg)  04/25/14 79 lb 3.2 oz (35.925 kg)  04/07/14 87 lb 12.8 oz (39.826 kg)   Constitutional: in NAD, cachectic appearance, temporal wasting Eyes: PERRLA, EOMI, no exophthalmos, no lid lag, no stare ENT: moist mucous membranes, enlarged, lumpy bumpy thyroid, no cervical lymphadenopathy Cardiovascular: RRR, No MRG Respiratory: CTA B Gastrointestinal: abdomen soft, NT, ND,  BS+ Musculoskeletal: no deformities, strength intact in all 4 Skin: moist, warm, no rashes Neurological: no tremor with outstretched hands, DTR normal in all 4  ASSESSMENT: 1. Graves ds.  - Thyroid uptake and scan (10/05/2013): There is uniform uptake within the thyroid gland which is mildly  enlarged. No nodularity. 24 hour I 131 uptake = 32.2% (normal 10-30%)  IMPRESSION: Thyroid imaging and uptake suggests mild Graves disease   - thyroid ultrasound (03/17/2013):  Right thyroid lobe: 5.0 x 2.7 x 2.0 cm   Left thyroid lobe: 4.7 x 2.7 x 1.5 cm   Isthmus: 7.0 mm in thickness in the midline   Focal nodules: The thyroid gland is diffusely heterogeneous. A 1.2 x 1.1 x 1.0 cm rounded, heterogeneous, predominately solid nodule in the mid right lobe, laterally. Suggestion of a 1.5 x 1.3 cm nodule in the mid to inferior left lobe at the level of the isthmus in the transverse plane, not visualized as a discrete nodule in the sagittal plane. Suggestion of a 1.7 x 1.5 x 0.7 cm oval, heterogeneous, predominately solid nodule in the isthmus centered to the right of midline. A 5 mm dense calcification in the isthmus slightly more inferiorly on the right without a well-defined mass separate from the calcification on the images.   Lymphadenopathy: None visualized.  IMPRESSION:  Multinodular goiter, as described above. The only definite discrete, confirmed nodule is in the lateral aspect of the mid right lobe, measuring 1.2 cm in maximum diameter.  - DEXA scan (07/08/2012): Normal  - lumbar spine x-ray (05/06/2013): compression fracture in the superior L1 endplate and [id number] anterior height loss  - RAI Tx 04/13/2014  PLAN:  1. Patient with Graves ds, with previous thyrotoxic sxs, including dramatic weight loss - initially doing fairly well on MMI, but we needed definitive tx for her thyrotoxicosis as the MMI could not be tapered down and she continued to lose weight >> had RAI Tx 1 mo ago and I advised  her to come back ~ 1 mo after this so we can check TFTs, however, she had a CT scan with contrast yesterday and we cannot repeat the TFTs today due to the large iodine load >> we will recheck in 1 mo - I advised her to stop MMI - she is feeling well and her appetite is back >> gained 9 lbs since last visit! - RTC in 3 mo

## 2014-05-20 ENCOUNTER — Other Ambulatory Visit (HOSPITAL_COMMUNITY): Payer: Self-pay | Admitting: Internal Medicine

## 2014-06-15 ENCOUNTER — Encounter: Payer: Self-pay | Admitting: Internal Medicine

## 2014-07-09 ENCOUNTER — Other Ambulatory Visit: Payer: Self-pay | Admitting: Internal Medicine

## 2014-07-18 ENCOUNTER — Other Ambulatory Visit (INDEPENDENT_AMBULATORY_CARE_PROVIDER_SITE_OTHER): Payer: PRIVATE HEALTH INSURANCE

## 2014-07-18 DIAGNOSIS — E0500 Thyrotoxicosis with diffuse goiter without thyrotoxic crisis or storm: Secondary | ICD-10-CM

## 2014-07-18 LAB — TSH: TSH: 47.45 u[IU]/mL — ABNORMAL HIGH (ref 0.35–4.50)

## 2014-07-18 LAB — T4, FREE: Free T4: 0.4 ng/dL — ABNORMAL LOW (ref 0.60–1.60)

## 2014-07-18 LAB — T3, FREE: T3, Free: 1 pg/mL — ABNORMAL LOW (ref 2.3–4.2)

## 2014-07-19 ENCOUNTER — Other Ambulatory Visit: Payer: Self-pay | Admitting: Internal Medicine

## 2014-07-19 DIAGNOSIS — E890 Postprocedural hypothyroidism: Secondary | ICD-10-CM | POA: Insufficient documentation

## 2014-07-19 MED ORDER — LEVOTHYROXINE SODIUM 50 MCG PO TABS: 50.0000 ug | ORAL_TABLET | Freq: Every day | ORAL | Status: DC

## 2014-08-15 ENCOUNTER — Ambulatory Visit: Payer: Medicaid Other | Admitting: Internal Medicine

## 2014-10-05 DIAGNOSIS — H4011X3 Primary open-angle glaucoma, severe stage: Secondary | ICD-10-CM | POA: Diagnosis not present

## 2014-10-05 DIAGNOSIS — H31421 Serous choroidal detachment, right eye: Secondary | ICD-10-CM | POA: Diagnosis not present

## 2014-10-05 DIAGNOSIS — H35351 Cystoid macular degeneration, right eye: Secondary | ICD-10-CM | POA: Diagnosis not present

## 2014-10-25 ENCOUNTER — Other Ambulatory Visit: Payer: Self-pay | Admitting: Internal Medicine

## 2014-10-28 NOTE — Telephone Encounter (Signed)
Message sent to front desk pool for an appt as outlined.

## 2014-11-12 DIAGNOSIS — I739 Peripheral vascular disease, unspecified: Secondary | ICD-10-CM | POA: Diagnosis not present

## 2014-11-13 DIAGNOSIS — I739 Peripheral vascular disease, unspecified: Secondary | ICD-10-CM | POA: Diagnosis not present

## 2014-11-16 DIAGNOSIS — I739 Peripheral vascular disease, unspecified: Secondary | ICD-10-CM | POA: Diagnosis not present

## 2014-11-17 DIAGNOSIS — I739 Peripheral vascular disease, unspecified: Secondary | ICD-10-CM | POA: Diagnosis not present

## 2014-11-18 DIAGNOSIS — I739 Peripheral vascular disease, unspecified: Secondary | ICD-10-CM | POA: Diagnosis not present

## 2014-11-19 DIAGNOSIS — I739 Peripheral vascular disease, unspecified: Secondary | ICD-10-CM | POA: Diagnosis not present

## 2014-11-20 DIAGNOSIS — I739 Peripheral vascular disease, unspecified: Secondary | ICD-10-CM | POA: Diagnosis not present

## 2014-11-21 DIAGNOSIS — I739 Peripheral vascular disease, unspecified: Secondary | ICD-10-CM | POA: Diagnosis not present

## 2014-11-22 DIAGNOSIS — I739 Peripheral vascular disease, unspecified: Secondary | ICD-10-CM | POA: Diagnosis not present

## 2014-11-23 DIAGNOSIS — I739 Peripheral vascular disease, unspecified: Secondary | ICD-10-CM | POA: Diagnosis not present

## 2014-11-24 DIAGNOSIS — I739 Peripheral vascular disease, unspecified: Secondary | ICD-10-CM | POA: Diagnosis not present

## 2014-11-25 DIAGNOSIS — I739 Peripheral vascular disease, unspecified: Secondary | ICD-10-CM | POA: Diagnosis not present

## 2014-11-28 ENCOUNTER — Telehealth: Payer: Self-pay | Admitting: Internal Medicine

## 2014-11-28 DIAGNOSIS — I739 Peripheral vascular disease, unspecified: Secondary | ICD-10-CM | POA: Diagnosis not present

## 2014-11-28 NOTE — Telephone Encounter (Signed)
Call to patient to confirm appointment for 11/29/14 at 1:45. [email or web address]

## 2014-11-29 ENCOUNTER — Encounter: Payer: Self-pay | Admitting: Internal Medicine

## 2014-11-29 ENCOUNTER — Ambulatory Visit (HOSPITAL_COMMUNITY)
Admission: RE | Admit: 2014-11-29 | Discharge: 2014-11-29 | Disposition: A | Discharge status: Home / Self Care | Payer: Medicare Other | Source: Ambulatory Visit | Attending: Internal Medicine | Admitting: Internal Medicine

## 2014-11-29 ENCOUNTER — Ambulatory Visit (INDEPENDENT_AMBULATORY_CARE_PROVIDER_SITE_OTHER): Payer: Medicare Other | Admitting: Internal Medicine

## 2014-11-29 ENCOUNTER — Telehealth: Payer: Self-pay | Admitting: Internal Medicine

## 2014-11-29 VITALS — BP 144/46 | HR 54 | Temp 97.5°F | Ht 67.0 in | Wt 113.3 lb

## 2014-11-29 DIAGNOSIS — Z888 Allergy status to other drugs, medicaments and biological substances status: Secondary | ICD-10-CM | POA: Diagnosis not present

## 2014-11-29 DIAGNOSIS — I739 Peripheral vascular disease, unspecified: Secondary | ICD-10-CM | POA: Diagnosis not present

## 2014-11-29 DIAGNOSIS — E119 Type 2 diabetes mellitus without complications: Secondary | ICD-10-CM | POA: Diagnosis not present

## 2014-11-29 DIAGNOSIS — I10 Essential (primary) hypertension: Secondary | ICD-10-CM | POA: Diagnosis not present

## 2014-11-29 DIAGNOSIS — R109 Unspecified abdominal pain: Secondary | ICD-10-CM | POA: Diagnosis not present

## 2014-11-29 DIAGNOSIS — R0601 Orthopnea: Secondary | ICD-10-CM | POA: Diagnosis not present

## 2014-11-29 DIAGNOSIS — R600 Localized edema: Secondary | ICD-10-CM

## 2014-11-29 DIAGNOSIS — F17210 Nicotine dependence, cigarettes, uncomplicated: Secondary | ICD-10-CM | POA: Diagnosis not present

## 2014-11-29 DIAGNOSIS — D649 Anemia, unspecified: Secondary | ICD-10-CM | POA: Diagnosis not present

## 2014-11-29 DIAGNOSIS — R0600 Dyspnea, unspecified: Secondary | ICD-10-CM | POA: Diagnosis not present

## 2014-11-29 DIAGNOSIS — E785 Hyperlipidemia, unspecified: Secondary | ICD-10-CM | POA: Diagnosis not present

## 2014-11-29 DIAGNOSIS — J90 Pleural effusion, not elsewhere classified: Secondary | ICD-10-CM | POA: Insufficient documentation

## 2014-11-29 DIAGNOSIS — E890 Postprocedural hypothyroidism: Secondary | ICD-10-CM | POA: Diagnosis not present

## 2014-11-29 DIAGNOSIS — J449 Chronic obstructive pulmonary disease, unspecified: Secondary | ICD-10-CM | POA: Insufficient documentation

## 2014-11-29 DIAGNOSIS — R0781 Pleurodynia: Secondary | ICD-10-CM

## 2014-11-29 DIAGNOSIS — I5031 Acute diastolic (congestive) heart failure: Secondary | ICD-10-CM | POA: Diagnosis not present

## 2014-11-29 LAB — COMPLETE METABOLIC PANEL WITH GFR
ALT: 9 U/L (ref 0–35)
AST: 18 U/L (ref 0–37)
Albumin: 3.2 g/dL — ABNORMAL LOW (ref 3.5–5.2)
Alkaline Phosphatase: 47 U/L (ref 39–117)
BUN: 13 mg/dL (ref 6–23)
CO2: 30 mEq/L (ref 19–32)
Calcium: 8.5 mg/dL (ref 8.4–10.5)
Chloride: 104 mEq/L (ref 96–112)
Creat: 0.77 mg/dL (ref 0.50–1.10)
GFR, Est African American: 83 mL/min
GFR, Est Non African American: 72 mL/min
Glucose, Bld: 82 mg/dL (ref 70–99)
Potassium: 3.8 mEq/L (ref 3.5–5.3)
Sodium: 141 mEq/L (ref 135–145)
Total Bilirubin: 0.3 mg/dL (ref 0.2–1.2)
Total Protein: 5.8 g/dL — ABNORMAL LOW (ref 6.0–8.3)

## 2014-11-29 LAB — CBC
HCT: 21 % — ABNORMAL LOW (ref 36.0–46.0)
Hemoglobin: 6.3 g/dL — CL (ref 12.0–15.0)
MCH: 25.4 pg — ABNORMAL LOW (ref 26.0–34.0)
MCHC: 30 g/dL (ref 30.0–36.0)
MCV: 84.7 fL (ref 78.0–100.0)
MPV: 9.9 fL (ref 8.6–12.4)
Platelets: 304 10*3/uL (ref 150–400)
RBC: 2.48 MIL/uL — ABNORMAL LOW (ref 3.87–5.11)
RDW: 17.9 % — ABNORMAL HIGH (ref 11.5–15.5)
WBC: 3.8 10*3/uL — ABNORMAL LOW (ref 4.0–10.5)

## 2014-11-29 LAB — BRAIN NATRIURETIC PEPTIDE: B Natriuretic Peptide: 371.4 pg/mL — ABNORMAL HIGH (ref 0.0–100.0)

## 2014-11-29 LAB — GLUCOSE, CAPILLARY: Glucose-Capillary: 138 mg/dL — ABNORMAL HIGH (ref 70–99)

## 2014-11-29 LAB — POCT GLYCOSYLATED HEMOGLOBIN (HGB A1C): Hemoglobin A1C: 6.6

## 2014-11-29 LAB — TSH: TSH: 138.357 u[IU]/mL — ABNORMAL HIGH (ref 0.350–4.500)

## 2014-11-29 MED ORDER — FUROSEMIDE 20 MG PO TABS: 20.0000 mg | ORAL_TABLET | Freq: Every day | ORAL | Status: DC

## 2014-11-29 MED ORDER — DICLOFENAC SODIUM 1 % TD GEL: 2.0000 g | Freq: Four times a day (QID) | TRANSDERMAL | Status: DC

## 2014-11-29 NOTE — Progress Notes (Signed)
Internal Medicine Clinic Attending  Case discussed with Dr. Emokpae at the time of the visit.  We reviewed the resident's history and exam and pertinent patient test results.  I agree with the assessment, diagnosis, and plan of care documented in the resident's note.  

## 2014-11-29 NOTE — Patient Instructions (Signed)
We will be starting you on some new medication called lasix. Take one tablet once everyday. It is called Lasix/Frusemide. Also we have ordered some test. We will ike to see you in 1 week.  I have also ordered a medication to help with the pain you are having in your side. Apply the medication to the side where you have pain. It is called Diclofenac or Voltaren gel.   It was nice seeing you today.

## 2014-11-29 NOTE — Telephone Encounter (Signed)
  Reason for call:   I placed an outgoing call to [name] at 9:45  PM regarding a critical lab value that was called to me by [employer]. She was seen at [facility] today for routine follow-up where labs, including CBC was checked. Her Hg was called to me from [facility] with critical value of 6.3. She denies hematemesis, melena, BRBPR, hematuria, or epistaxis. She also denies lightheadedness, dizziness, syncope, chest pain, dyspnea, or palpitations. Her last Hg was 11 on 04/25/14. She has history of multiple gastric and small intestine AVM's on the last two EGD's by Dr. Benson Norway from  August of 2014. Her gastric AVM's were repaired with APC and ablation and required blood transfusion during that admission.    Assessment/ Plan:   [name] has asymptomatic blood loss anemia due to probable chronic bleeding from small bowel vs gastric AVM's. She is currently asymptomatic with no active bleeding or hemodynamic instability. Her blood pressure in clinic today was stable at 144/46 and HR 56. I advised her to come to the ED tonight and she stated she would not be able to come until tomorrow in the morning. I instructed her to come tonight if she had any active bleeding or symptoms of anemia (lightheaded, dizzy, chest pain, dyspnea, palpitations). She verbalized understanding and stated she would have someone bring her to the ED tomorrow.    Juluis Mire, MD   11/29/2014, 11:13 PM

## 2014-11-29 NOTE — Progress Notes (Signed)
[name], female   DOB: [birth year], 81 y.o.   MRN: [id number]   Subjective:   [name] female   DOB: 05-20-1929 81 y.o.   MRN: [id number]  HPI: [name] is a 81 y.o. With PMH - HTN, PVD, Syncope, Gastric ulcer without hemorrhage or perforation, DM2, Hypothyroidism status post RAI ablation, tobacco abuse, HLD, protein calorie malnutrition, Anemia. Presented today for routine follow up visit with complaints of dyspnea with exertion, bilat lower extremity edema. Dyspnea on exertion has been present for the past 1 month, present with mild activities such as dressing her bed. Pt uses 2 pillows to sleep, this is new over the past month also, says its because she breathes better with 2 pillow.  Pt denies cough, fevers or chest pain. No aabdominal swelling. Pt also endorses swelling of her bilat, lower extremities, worse on the left, present for the past 1 week ( per chart review, she has had this before). No associated pain except for the sensation of tightness, also no redness, or warmth.   Past Medical History  Diagnosis Date  . Intraductal carcinoma 06/2003    Of right breast. s/p right partial mastectomy. // Followed by Dr. Marylene Buerger  . PVD (peripheral vascular disease)     S/P BL femoral-popliteal bypass surgery 03/2001 (right) and 01/2003 (left)  . Diabetes mellitus, type 2     Well controlled  . Hypertension   . Hyperlipidemia   . TB lung, latent     Treated with INH in 11/2007  . Arthritis     lt hip  . Thyroid disease     Graves disease  . Ulcer     gastric antral ulcer and AVMs  . Mesenteric ischemia   . Graves disease     HX OF GRAVES   Current Outpatient Prescriptions  Medication Sig Dispense Refill  . aspirin EC 81 MG tablet Take 81 mg by mouth daily.    Marland Kitchen atorvastatin (LIPITOR) 10 MG tablet Take 1 tablet (10 mg total) by mouth daily at 6 PM. 90 tablet 4  . atropine 1 % ophthalmic solution Place 1 drop into the right eye 2  (two) times daily.    . calcium citrate-vitamin D (CITRACAL+D) 315-200 MG-UNIT per tablet Take 2 tablets by mouth 2 (two) times daily. 120 tablet 11  . Docusate Sodium (DSS) 100 MG CAPS Take 100 mg by mouth daily. 60 each 0  . dorzolamide-timolol (COSOPT) 22.3-6.8 MG/ML ophthalmic solution Place 1 drop into both eyes 2 (two) times daily.     . feeding supplement, RESOURCE BREEZE, (RESOURCE BREEZE) LIQD Take 1 Container by mouth 2 (two) times daily between meals.  0  . gentamicin (GARAMYCIN) 0.3 % ophthalmic solution Place 1 drop into the right eye 4 (four) times daily.    Marland Kitchen ketorolac (ACULAR) 0.5 % ophthalmic solution Place 1 drop into the right eye 2 (two) times daily.    Marland Kitchen latanoprost (XALATAN) 0.005 % ophthalmic solution Place 1 drop into both eyes at bedtime.    Marland Kitchen levothyroxine (SYNTHROID, LEVOTHROID) 50 MCG tablet Take 1 tablet (50 mcg total) by mouth daily. 60 tablet 2  . lisinopril-hydrochlorothiazide (PRINZIDE,ZESTORETIC) 20-12.5 MG per tablet TAKE 1 TABLET BY MOUTH EVERY DAY 30 tablet 1  . Multiple Vitamin (MULTIVITAMIN WITH MINERALS) TABS tablet Take 1 tablet by mouth daily.    . pantoprazole (PROTONIX) 40 MG tablet Take 40 mg by mouth daily.    . pilocarpine (PILOCAR) 1 %  ophthalmic solution     . polyethylene glycol (MIRALAX / GLYCOLAX) packet Take 17 g by mouth daily as needed for severe constipation. 14 each 0  . traMADol (ULTRAM) 50 MG tablet Take 2 tablets (100 mg total) by mouth 2 (two) times daily. 30 tablet 0   No current facility-administered medications for this visit.   Family History  Problem Relation Age of Onset  . Cancer Daughter 33    uterine cancer  . Cancer Father    History   Social History  . Marital Status: Widowed    Spouse Name: N/A  . Number of Children: N/A  . Years of Education: N/A   Occupational History  . nurse     Private duty nurse   Social History Main Topics  . Smoking status: Light Tobacco Smoker -- 0.20 packs/day for 64 years     Types: Cigarettes    Last Attempt to Quit: 02/28/2013  . Smokeless tobacco: Never Used     Comment: started back smoking but cutting back.  . Alcohol Use: No  . Drug Use: No  . Sexual Activity: Not on file   Other Topics Concern  . None   Social History Narrative   Lives alone with home health aid Monday-Friday   Review of Systems: CONSTITUTIONAL- No Fever, pt is gaining weight. HEAD- No Headache or dizziness. RESPIRATORY- No Cough or SOB. CARDIAC- No chest pain. GI- No abdominal swelling or pain.  Objective:  Physical Exam: Filed Vitals:   11/29/14 1408  BP: 144/46  Pulse: 54  Temp: 97.5 F (36.4 C)  TempSrc: Oral  Height: 5\' 7"  (1.702 m)  Weight: 113 lb 4.8 oz (51.393 kg)  SpO2: 100%   GENERAL- alert, co-operative, slim, appears as stated age, not in any distress. HEENT- Atraumatic, normocephalic, oral mucosa appears moist, neck supple. CARDIAC- RRR, tenderness to palpation of left side of lower rib cage.  RESP- Moving equal volumes of air, and clear to auscultation bilaterally, no wheezes or crackles. ABDOMEN- Soft, nontender, not disteno palpable masses or organomegaly, bowel sounds present. NEURO- No obvious Cr N abnormality, strenght upper and lower extremities- 5/5 EXTREMITIES- pulse 2+, symmetric, Bilat lower extremity- +2 pitting pedal edema, shiny legs, tense, no calf tenderness. SKIN- Warm, dry, No rash or lesion. PSYCH- Normal mood and affect, appropriate thought content and speech.  Assessment & Plan:   The patient's case and plan of care was discussed with attending physician, Dr. Ellwood Dense.  Orthopnea- Differential at this point- Congestive heart failure- considering swelling of bilat lower extremity, weight has increased also, but pt also had hyperthroidism in the past, with subsequent RAI, so she has gained weight since then, had to tell if its all fluid. Last echo was done- 2009, not suggestive of congestive heart failure. Also consideration, for  symptomatic hypothyroidism-  Last TSH- 47.45, placed on Synthroid- 50mg  daily by her endocrinologist, which she says she has been taking, but she has not followed up with her endocrinologist or followed up her in clinic.  - Chest xray - BNP - CBC - CMET - Echo - TSH - Lasix- 20mg  daily, can increase if needed.  Rib pain on left side- pt has had a fracture in this area, previously with persistent pain, area looks benign. Tried tylenol without relief. - Volatren gel.    Addendum- CBC- Result- HgB- 6.3, which will explain patients symptoms of dyspnea with exertion. Likely cause is GI bleed, as she has had this in the past, with 3 endoscopies done in  04/2013 revealing- Multiple non bleeding AVMs- small bowel and gastric and also gastric ulcers.  Also hypothyroidism- TSH-138.357  Would have to dig deeper into compliance history, as if she has been complaint then dose of synthroid will have to be increased. - Called patient, she will need to be admitted and tranasfused, also investigate into acute GI blood loss. She has a bed- [id number] and will be directly admitted to the hospital.

## 2014-11-30 ENCOUNTER — Encounter (HOSPITAL_COMMUNITY): Payer: Self-pay | Admitting: General Practice

## 2014-11-30 ENCOUNTER — Observation Stay (HOSPITAL_COMMUNITY): Payer: Medicare Other

## 2014-11-30 ENCOUNTER — Observation Stay (HOSPITAL_COMMUNITY)
Admission: AD | Admit: 2014-11-30 | Discharge: 2014-12-01 | Disposition: A | Discharge status: Home / Self Care | Payer: Medicare Other | Source: Ambulatory Visit | Attending: Infectious Disease | Admitting: Infectious Disease

## 2014-11-30 DIAGNOSIS — I5031 Acute diastolic (congestive) heart failure: Secondary | ICD-10-CM | POA: Diagnosis not present

## 2014-11-30 DIAGNOSIS — J449 Chronic obstructive pulmonary disease, unspecified: Secondary | ICD-10-CM

## 2014-11-30 DIAGNOSIS — Z720 Tobacco use: Secondary | ICD-10-CM | POA: Diagnosis present

## 2014-11-30 DIAGNOSIS — R109 Unspecified abdominal pain: Secondary | ICD-10-CM | POA: Diagnosis present

## 2014-11-30 DIAGNOSIS — F17210 Nicotine dependence, cigarettes, uncomplicated: Secondary | ICD-10-CM | POA: Diagnosis not present

## 2014-11-30 DIAGNOSIS — R0781 Pleurodynia: Secondary | ICD-10-CM | POA: Diagnosis not present

## 2014-11-30 DIAGNOSIS — I10 Essential (primary) hypertension: Secondary | ICD-10-CM | POA: Insufficient documentation

## 2014-11-30 DIAGNOSIS — R10A2 Flank pain, left side: Secondary | ICD-10-CM

## 2014-11-30 DIAGNOSIS — I739 Peripheral vascular disease, unspecified: Secondary | ICD-10-CM

## 2014-11-30 DIAGNOSIS — M25552 Pain in left hip: Secondary | ICD-10-CM

## 2014-11-30 DIAGNOSIS — E119 Type 2 diabetes mellitus without complications: Secondary | ICD-10-CM

## 2014-11-30 DIAGNOSIS — E785 Hyperlipidemia, unspecified: Secondary | ICD-10-CM | POA: Insufficient documentation

## 2014-11-30 DIAGNOSIS — M25551 Pain in right hip: Secondary | ICD-10-CM

## 2014-11-30 DIAGNOSIS — I951 Orthostatic hypotension: Secondary | ICD-10-CM

## 2014-11-30 DIAGNOSIS — E890 Postprocedural hypothyroidism: Secondary | ICD-10-CM | POA: Diagnosis present

## 2014-11-30 DIAGNOSIS — Z888 Allergy status to other drugs, medicaments and biological substances status: Secondary | ICD-10-CM | POA: Insufficient documentation

## 2014-11-30 DIAGNOSIS — D649 Anemia, unspecified: Secondary | ICD-10-CM | POA: Diagnosis not present

## 2014-11-30 DIAGNOSIS — S299XXA Unspecified injury of thorax, initial encounter: Secondary | ICD-10-CM | POA: Diagnosis not present

## 2014-11-30 DIAGNOSIS — R600 Localized edema: Secondary | ICD-10-CM

## 2014-11-30 HISTORY — DX: Anemia, unspecified: D64.9

## 2014-11-30 LAB — BASIC METABOLIC PANEL
Anion gap: 7 (ref 5–15)
BUN: 12 mg/dL (ref 6–23)
CO2: 31 mmol/L (ref 19–32)
Calcium: 8.6 mg/dL (ref 8.4–10.5)
Chloride: 101 mmol/L (ref 96–112)
Creatinine, Ser: 0.95 mg/dL (ref 0.50–1.10)
GFR calc Af Amer: 62 mL/min — ABNORMAL LOW (ref >90)
GFR calc non Af Amer: 54 mL/min — ABNORMAL LOW (ref >90)
Glucose, Bld: 105 mg/dL — ABNORMAL HIGH (ref 70–99)
Potassium: 3.4 mmol/L — ABNORMAL LOW (ref 3.5–5.1)
Sodium: 139 mmol/L (ref 135–145)

## 2014-11-30 LAB — RETICULOCYTES
RBC.: 2.57 MIL/uL — ABNORMAL LOW (ref 3.87–5.11)
Retic Count, Absolute: 59.1 10*3/uL (ref 19.0–186.0)
Retic Ct Pct: 2.3 % (ref 0.4–3.1)

## 2014-11-30 LAB — CBC
HCT: 21.6 % — ABNORMAL LOW (ref 36.0–46.0)
Hemoglobin: 6.6 g/dL — CL (ref 12.0–15.0)
MCH: 25.7 pg — ABNORMAL LOW (ref 26.0–34.0)
MCHC: 30.6 g/dL (ref 30.0–36.0)
MCV: 84 fL (ref 78.0–100.0)
Platelets: 296 10*3/uL (ref 150–400)
RBC: 2.57 MIL/uL — ABNORMAL LOW (ref 3.87–5.11)
RDW: 18.3 % — ABNORMAL HIGH (ref 11.5–15.5)
WBC: 4.3 10*3/uL (ref 4.0–10.5)

## 2014-11-30 LAB — PREPARE RBC (CROSSMATCH)

## 2014-11-30 MED ORDER — CALCIUM CARBONATE 1250 (500 CA) MG PO TABS
1.0000 | ORAL_TABLET | Freq: Every day | ORAL | Status: DC
  Administered 2014-12-01: 500 mg via ORAL
  Filled 2014-11-30 (×2): qty 1

## 2014-11-30 MED ORDER — ADULT MULTIVITAMIN W/MINERALS CH
1.0000 | ORAL_TABLET | Freq: Every day | ORAL | Status: DC
  Administered 2014-12-01: 1 via ORAL
  Filled 2014-11-30: qty 1

## 2014-11-30 MED ORDER — ASPIRIN EC 81 MG PO TBEC
81.0000 mg | DELAYED_RELEASE_TABLET | Freq: Every day | ORAL | Status: DC
  Administered 2014-12-01: 81 mg via ORAL
  Filled 2014-11-30: qty 1

## 2014-11-30 MED ORDER — CALCIUM CITRATE-VITAMIN D 315-200 MG-UNIT PO TABS: 2.0000 | ORAL_TABLET | Freq: Two times a day (BID) | ORAL | Status: DC

## 2014-11-30 MED ORDER — DOCUSATE SODIUM 100 MG PO CAPS
100.0000 mg | ORAL_CAPSULE | Freq: Every day | ORAL | Status: DC
  Administered 2014-12-01: 100 mg via ORAL
  Filled 2014-11-30: qty 1

## 2014-11-30 MED ORDER — DORZOLAMIDE HCL-TIMOLOL MAL 2-0.5 % OP SOLN
1.0000 [drp] | Freq: Two times a day (BID) | OPHTHALMIC | Status: DC
  Administered 2014-11-30 – 2014-12-01 (×2): 1 [drp] via OPHTHALMIC
  Filled 2014-11-30 (×2): qty 10

## 2014-11-30 MED ORDER — HYDROCHLOROTHIAZIDE 12.5 MG PO CAPS
12.5000 mg | ORAL_CAPSULE | Freq: Every day | ORAL | Status: DC
  Administered 2014-12-01: 12.5 mg via ORAL
  Filled 2014-11-30: qty 1

## 2014-11-30 MED ORDER — PANTOPRAZOLE SODIUM 40 MG PO TBEC
40.0000 mg | DELAYED_RELEASE_TABLET | Freq: Every day | ORAL | Status: DC
  Administered 2014-12-01: 40 mg via ORAL
  Filled 2014-11-30: qty 1

## 2014-11-30 MED ORDER — LEVOTHYROXINE SODIUM 75 MCG PO TABS
75.0000 ug | ORAL_TABLET | Freq: Every day | ORAL | Status: DC
  Administered 2014-12-01: 75 ug via ORAL
  Filled 2014-11-30 (×2): qty 1

## 2014-11-30 MED ORDER — CHOLECALCIFEROL 10 MCG (400 UNIT) PO TABS
400.0000 [IU] | ORAL_TABLET | Freq: Every day | ORAL | Status: DC
  Administered 2014-11-30 – 2014-12-01 (×2): 400 [IU] via ORAL
  Filled 2014-11-30 (×2): qty 1

## 2014-11-30 MED ORDER — BOOST / RESOURCE BREEZE PO LIQD
1.0000 | Freq: Two times a day (BID) | ORAL | Status: DC
  Administered 2014-12-01: 1 via ORAL

## 2014-11-30 MED ORDER — HEPARIN SODIUM (PORCINE) 5000 UNIT/ML IJ SOLN
5000.0000 [IU] | Freq: Three times a day (TID) | INTRAMUSCULAR | Status: DC
  Administered 2014-11-30 – 2014-12-01 (×4): 5000 [IU] via SUBCUTANEOUS
  Filled 2014-11-30 (×5): qty 1

## 2014-11-30 MED ORDER — LATANOPROST 0.005 % OP SOLN
1.0000 [drp] | Freq: Every day | OPHTHALMIC | Status: DC
  Filled 2014-11-30: qty 2.5

## 2014-11-30 MED ORDER — ATORVASTATIN CALCIUM 10 MG PO TABS
10.0000 mg | ORAL_TABLET | Freq: Every day | ORAL | Status: DC
  Administered 2014-11-30: 10 mg via ORAL
  Filled 2014-11-30 (×2): qty 1

## 2014-11-30 MED ORDER — SODIUM CHLORIDE 0.9 % IV SOLN
Freq: Once | INTRAVENOUS | Status: AC
  Administered 2014-11-30: 17:00:00 via INTRAVENOUS

## 2014-11-30 MED ORDER — ALBUTEROL SULFATE (2.5 MG/3ML) 0.083% IN NEBU
2.5000 mg | INHALATION_SOLUTION | Freq: Four times a day (QID) | RESPIRATORY_TRACT | Status: DC | PRN
Start: 2014-11-30 — End: 2014-12-01

## 2014-11-30 MED ORDER — LISINOPRIL-HYDROCHLOROTHIAZIDE 20-12.5 MG PO TABS: 1.0000 | ORAL_TABLET | Freq: Every day | ORAL | Status: DC

## 2014-11-30 MED ORDER — TRAMADOL HCL 50 MG PO TABS: 50.0000 mg | ORAL_TABLET | Freq: Four times a day (QID) | ORAL | Status: DC | PRN

## 2014-11-30 MED ORDER — LISINOPRIL 20 MG PO TABS
20.0000 mg | ORAL_TABLET | Freq: Every day | ORAL | Status: DC
  Administered 2014-12-01: 20 mg via ORAL
  Filled 2014-11-30: qty 1

## 2014-11-30 MED ORDER — FUROSEMIDE 10 MG/ML IJ SOLN
20.0000 mg | Freq: Once | INTRAMUSCULAR | Status: AC
  Administered 2014-11-30: 20 mg via INTRAVENOUS
  Filled 2014-11-30: qty 2

## 2014-11-30 NOTE — Progress Notes (Signed)
Patient and daughter oriented to room and unit protocol.
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
## 2014-11-30 NOTE — Telephone Encounter (Signed)
Pt's daughter calls and states her mother didn't quite understand what the doctor was calling her for, she is informed and states she will have her mother at [facility] by 1230 today

## 2014-11-30 NOTE — Progress Notes (Signed)
Critical hemoglobin of 6.6 called by lab. Physician notified.

## 2014-12-01 DIAGNOSIS — F17210 Nicotine dependence, cigarettes, uncomplicated: Secondary | ICD-10-CM | POA: Diagnosis not present

## 2014-12-01 DIAGNOSIS — J449 Chronic obstructive pulmonary disease, unspecified: Secondary | ICD-10-CM | POA: Diagnosis not present

## 2014-12-01 DIAGNOSIS — R1032 Left lower quadrant pain: Secondary | ICD-10-CM

## 2014-12-01 DIAGNOSIS — E890 Postprocedural hypothyroidism: Secondary | ICD-10-CM | POA: Diagnosis not present

## 2014-12-01 DIAGNOSIS — E785 Hyperlipidemia, unspecified: Secondary | ICD-10-CM | POA: Diagnosis not present

## 2014-12-01 DIAGNOSIS — I5031 Acute diastolic (congestive) heart failure: Secondary | ICD-10-CM | POA: Diagnosis not present

## 2014-12-01 DIAGNOSIS — I739 Peripheral vascular disease, unspecified: Secondary | ICD-10-CM | POA: Diagnosis not present

## 2014-12-01 DIAGNOSIS — I951 Orthostatic hypotension: Secondary | ICD-10-CM | POA: Insufficient documentation

## 2014-12-01 DIAGNOSIS — R600 Localized edema: Secondary | ICD-10-CM

## 2014-12-01 DIAGNOSIS — D649 Anemia, unspecified: Secondary | ICD-10-CM | POA: Diagnosis not present

## 2014-12-01 DIAGNOSIS — I10 Essential (primary) hypertension: Secondary | ICD-10-CM | POA: Diagnosis not present

## 2014-12-01 DIAGNOSIS — Z888 Allergy status to other drugs, medicaments and biological substances status: Secondary | ICD-10-CM | POA: Diagnosis not present

## 2014-12-01 DIAGNOSIS — E119 Type 2 diabetes mellitus without complications: Secondary | ICD-10-CM

## 2014-12-01 DIAGNOSIS — R109 Unspecified abdominal pain: Secondary | ICD-10-CM | POA: Diagnosis not present

## 2014-12-01 LAB — CBC
HCT: 25.3 % — ABNORMAL LOW (ref 36.0–46.0)
Hemoglobin: 8.1 g/dL — ABNORMAL LOW (ref 12.0–15.0)
MCH: 26.7 pg (ref 26.0–34.0)
MCHC: 32 g/dL (ref 30.0–36.0)
MCV: 83.5 fL (ref 78.0–100.0)
Platelets: 255 10*3/uL (ref 150–400)
RBC: 3.03 MIL/uL — ABNORMAL LOW (ref 3.87–5.11)
RDW: 16.6 % — ABNORMAL HIGH (ref 11.5–15.5)
WBC: 5.8 10*3/uL (ref 4.0–10.5)

## 2014-12-01 LAB — TYPE AND SCREEN
ABO/RH(D): O POS
Antibody Screen: NEGATIVE
Unit division: 0

## 2014-12-01 LAB — IRON AND TIBC
Iron: 29 ug/dL — ABNORMAL LOW (ref 42–145)
Saturation Ratios: 9 % — ABNORMAL LOW (ref 20–55)
TIBC: 335 ug/dL (ref 250–470)
UIBC: 306 ug/dL (ref 125–400)

## 2014-12-01 LAB — FOLATE: Folate: 10.3 ng/mL

## 2014-12-01 LAB — FERRITIN: Ferritin: 28 ng/mL (ref 10–291)

## 2014-12-01 LAB — VITAMIN B12: Vitamin B-12: 425 pg/mL (ref 211–911)

## 2014-12-01 MED ORDER — LEVOTHYROXINE SODIUM 50 MCG PO TABS: 75.0000 ug | ORAL_TABLET | Freq: Every day | ORAL | Status: DC

## 2014-12-01 MED ORDER — FERROUS SULFATE 325 (65 FE) MG PO TABS: 325.0000 mg | ORAL_TABLET | Freq: Three times a day (TID) | ORAL | Status: DC

## 2014-12-01 NOTE — Discharge Instructions (Signed)
Anemia, Nonspecific Anemia is a condition in which the concentration of red blood cells or hemoglobin in the blood is below normal. Hemoglobin is a substance in red blood cells that carries oxygen to the tissues of the body. Anemia results in not enough oxygen reaching these tissues.  CAUSES  Common causes of anemia include:   Excessive bleeding. Bleeding may be internal or external. This includes excessive bleeding from periods (in women) or from the intestine.   Poor nutrition.   Chronic kidney, thyroid, and liver disease.  Bone marrow disorders that decrease red blood cell production.  Cancer and treatments for cancer.  HIV, AIDS, and their treatments.  Spleen problems that increase red blood cell destruction.  Blood disorders.  Excess destruction of red blood cells due to infection, medicines, and autoimmune disorders. SIGNS AND SYMPTOMS   Minor weakness.   Dizziness.   Headache.  Palpitations.   Shortness of breath, especially with exercise.   Paleness.  Cold sensitivity.  Indigestion.  Nausea.  Difficulty sleeping.  Difficulty concentrating. Symptoms may occur suddenly or they may develop slowly.  DIAGNOSIS  Additional blood tests are often needed. These help your health care provider determine the best treatment. Your health care provider will check your stool for blood and look for other causes of blood loss.  TREATMENT  Treatment varies depending on the cause of the anemia. Treatment can include:   Supplements of iron, vitamin B12, or folic acid.   Hormone medicines.   A blood transfusion. This may be needed if blood loss is severe.   Hospitalization. This may be needed if there is significant continual blood loss.   Dietary changes.  Spleen removal. HOME CARE INSTRUCTIONS Keep all follow-up appointments. It often takes many weeks to correct anemia, and having your health care provider check on your condition and your response to  treatment is very important. SEEK IMMEDIATE MEDICAL CARE IF:   You develop extreme weakness, shortness of breath, or chest pain.   You become dizzy or have trouble concentrating.  You develop heavy vaginal bleeding.   You develop a rash.   You have bloody or black, tarry stools.   You faint.   You vomit up blood.   You vomit repeatedly.   You have abdominal pain.  You have a fever or persistent symptoms for more than 2-3 days.   You have a fever and your symptoms suddenly get worse.   You are dehydrated.  MAKE SURE YOU:  Understand these instructions.  Will watch your condition.  Will get help right away if you are not doing well or get worse. Document Released: 10/24/2004 Document Revised: 05/19/2013 Document Reviewed: 03/12/2013 Exit[employer] Patient Information 2015 [employer]. This information is not intended to replace advice given to you by your health care provider. Make sure you discuss any questions you have with your health care provider.  

## 2014-12-01 NOTE — H&P (Signed)
  Date: 12/01/2014  Patient name: Gina [name]  Medical record number: [id number]  Date of birth: 1928-10-04   I have seen and evaluated Gina [name] and discussed their care with the Residency Team.   Assessment and Plan: I have seen and evaluated the patient as outlined above. I agree with the formulated Assessment and Plan as detailed in the residents' admission note, with the following changes:   Briefly this is an 82-year-old lady with history of Graves' disease status post ablation and post thyroid ablation hypothyroidism history of gastric AV malformation with GI bleed history of hypertension hyperlipidemia and diabetes mellitus.  She came to the clinic yesterday with complaints of dyspnea on exertion and bilateral lower extremity edema that was new. She also relates a one-month history of fatigue and dyspnea on moderate exertion. She was found to be profoundly anemic with a hemoglobin of 6.3 down from 11 in July. Her blood work here shows her to have significant iron deficiency anemia with a ferritin that is quite low.  Additionally labs checked included a thyroid signaling hormone which was 138.  Patient had been taking levothyroxin 50 mic grams a day but taking it with her medications and with food.  We made her overnight gave her a blood transfusion as well as diuresis for her lower extremity edema. We are checking an echocardiogram.  Today this morning she seemed comfortable walking about her room and then sitting back down and conversing with [place]. Her exam was notable for clear lungs and bradycardia, abdomen was soft. Neuro nonfocal.  #1 Profound anemia: Obviously there is concern about bleed from her AV malformation. We're trying to get stool guaiacs checked. I don't think that she has an acute bleed right now but I do certainly think that she not must have reassessment of her upper GI system with an upper endoscopy in the very near future. We should ensure that if she is that  she would have this if not during this hospitalization very shortly after discharge.  #2 post ablative profound hypothyroidism: Increased her thyroid dose to 75 g of levothyroxine and asked her to take this now on an empty stomach.  #3 hypertension she was severely hypertensive this morning on blood pressure check and her blood pressure in the bed and sitting was in the 190s. Girtha Rm checked her OS vitals and pts BP did drop to 151 from supine of 189 after standing for 3 minutes.   Given her hx of prior fall we will need to be cautious about being TOO aggressive about BP control.   In order to avoid a fall from orthostatic BP drop might be best to target her standing BP  #3 DM: well controlled      Truman Hayward, MD 3/3/20161:30 PM

## 2014-12-01 NOTE — Progress Notes (Signed)
Subjective:  NAEON. Slept well. S/p transfusion of 1 u pRBCs with increase in Hgb to 8.1 from 6.3. Given 20 mg IV lasix for edema. Woke to use bathroom as we gave Lasix last night.  Has not noticed significant improvement in SOB or peripheral edema.   Objective: Vital signs in last 24 hours: Filed Vitals:   12/01/14 1121 12/01/14 1123 12/01/14 1125 12/01/14 1127  BP: 189/34 189/37 177/38 151/43  Pulse: 52 50 53 56  Temp:      TempSrc:      Resp:      Height:      Weight:      SpO2: 97% 100% 96% 98%   Weight change:   Intake/Output Summary (Last 24 hours) at 12/01/14 1313 Last data filed at 12/01/14 0939  Gross per 24 hour  Intake    607 ml  Output   1250 ml  Net   -643 ml   BP 151/43 mmHg  Pulse 56  Temp(Src) 98.8 F (37.1 C) (Oral)  Resp 15  Ht $R'5\' 7"'dV$  (1.702 m)  Wt 51 kg (112 lb 7 oz)  BMI 17.61 kg/m2  SpO2 98%  General Appearance:    Thin appearing female, no distress, cooperative                    Back:     Symmetric, no curvature, ROM normal, no CVA tenderness  Lungs:     Clear to auscultation bilaterally, respirations unlabored  Chest Wall:    Tenderness to palpation of L rib   Heart:    bradycardic, S1 and S2 normal, no murmur, rub   or gallop  Breast Exam:    No tenderness, masses, or nipple abnormality  Abdomen:     Soft, non-tender, bowel sounds active all four quadrants,    no masses, no organomegaly        Extremities:   2+ pitting edema to knees bilaterally, extremities normal, atraumatic, no cyanosis               Lab Results: Recent Results (from the past 2160 hour(s))  Glucose, capillary     Status: Abnormal   Collection Time: 11/29/14  2:12 PM  Result Value Ref Range   Glucose-Capillary 138 (H) 70 - 99 mg/dL  POCT HgB A1C     Status: None   Collection Time: 11/29/14  2:21 PM  Result Value Ref Range   Hemoglobin A1C 6.6   CBC no Diff     Status: Abnormal   Collection Time: 11/29/14  3:39 PM  Result Value Ref Range   WBC 3.8 (L)  4.0 - 10.5 K/uL   RBC 2.48 (L) 3.87 - 5.11 MIL/uL   Hemoglobin 6.3 (LL) 12.0 - 15.0 g/dL    Comment: Result repeated and verified.   HCT 21.0 (L) 36.0 - 46.0 %   MCV 84.7 78.0 - 100.0 fL   MCH 25.4 (L) 26.0 - 34.0 pg   MCHC 30.0 30.0 - 36.0 g/dL   RDW 17.9 (H) 11.5 - 15.5 %   Platelets 304 150 - 400 K/uL   MPV 9.9 8.6 - 12.4 fL  COMPLETE METABOLIC PANEL WITH GFR     Status: Abnormal   Collection Time: 11/29/14  3:39 PM  Result Value Ref Range   Sodium 141 135 - 145 mEq/L   Potassium 3.8 3.5 - 5.3 mEq/L   Chloride 104 96 - 112 mEq/L   CO2 30 19 - 32 mEq/L   Glucose,  Bld 82 70 - 99 mg/dL   BUN 13 6 - 23 mg/dL   Creat 5.14 6.04 - 7.99 mg/dL   Total Bilirubin 0.3 0.2 - 1.2 mg/dL   Alkaline Phosphatase 47 39 - 117 U/L   AST 18 0 - 37 U/L   ALT 9 0 - 35 U/L   Total Protein 5.8 (L) 6.0 - 8.3 g/dL   Albumin 3.2 (L) 3.5 - 5.2 g/dL   Calcium 8.5 8.4 - 87.2 mg/dL   GFR, Est African American 83 mL/min   GFR, Est Non African American 72 mL/min    Comment:   The estimated GFR is a calculation valid for adults (>=60 years old) that uses the CKD-EPI algorithm to adjust for age and sex. It is   not to be used for children, pregnant women, hospitalized patients,    patients on dialysis, or with rapidly changing kidney function. According to the NKDEP, eGFR >89 is normal, 60-89 shows mild impairment, 30-59 shows moderate impairment, 15-29 shows severe impairment and <15 is ESRD.     TSH     Status: Abnormal   Collection Time: 11/29/14  3:39 PM  Result Value Ref Range   TSH 138.357 (H) 0.350 - 4.500 uIU/mL  Brain natriuretic peptide (15872)     Status: Abnormal   Collection Time: 11/29/14  3:40 PM  Result Value Ref Range   B Natriuretic Peptide 371.4 (H) 0.0 - 100.0 pg/mL  Type and screen     Status: None   Collection Time: 11/30/14  7:23 PM  Result Value Ref Range   ABO/RH(D) O POS    Antibody Screen NEG    Sample Expiration 12/03/2014    Unit Number [id number]    Blood  Component Type RBC LR PHER2    Unit division 00    Status of Unit ISSUED,FINAL    Transfusion Status OK TO TRANSFUSE    Crossmatch Result Compatible   Prepare RBC     Status: None   Collection Time: 11/30/14  7:23 PM  Result Value Ref Range   Order Confirmation ORDER PROCESSED BY BLOOD BANK   CBC     Status: Abnormal   Collection Time: 11/30/14  7:23 PM  Result Value Ref Range   WBC 4.3 4.0 - 10.5 K/uL   RBC 2.57 (L) 3.87 - 5.11 MIL/uL   Hemoglobin 6.6 (LL) 12.0 - 15.0 g/dL    Comment: REPEATED TO VERIFY CRITICAL RESULT CALLED TO, READ BACK BY AND VERIFIED WITH: Dallas Breeding 2102 11/30/14 D BRADLEY    HCT 21.6 (L) 36.0 - 46.0 %   MCV 84.0 78.0 - 100.0 fL   MCH 25.7 (L) 26.0 - 34.0 pg   MCHC 30.6 30.0 - 36.0 g/dL   RDW 43.2 (H) 00.3 - 79.4 %   Platelets 296 150 - 400 K/uL    Comment: PLATELET COUNT CONFIRMED BY SMEAR  Vitamin B12     Status: None   Collection Time: 11/30/14  7:23 PM  Result Value Ref Range   Vitamin B-12 425 211 - 911 pg/mL    Comment: Performed at [facility]  Folate     Status: None   Collection Time: 11/30/14  7:23 PM  Result Value Ref Range   Folate 10.3 ng/mL    Comment: (NOTE) Reference Ranges        Deficient:       0.4 - 3.3 ng/mL        Indeterminate:   3.4 - 5.4 ng/mL  Normal:              > 5.4 ng/mL Performed at [facility]   Iron and TIBC     Status: Abnormal   Collection Time: 11/30/14  7:23 PM  Result Value Ref Range   Iron 29 (L) 42 - 145 ug/dL   TIBC 919 918 - 112 ug/dL   Saturation Ratios 9 (L) 20 - 55 %   UIBC 306 125 - 400 ug/dL    Comment: Performed at [facility]  Ferritin     Status: None   Collection Time: 11/30/14  7:23 PM  Result Value Ref Range   Ferritin 28 10 - 291 ng/mL    Comment: Performed at [facility]  Reticulocytes     Status: Abnormal   Collection Time: 11/30/14  7:23 PM  Result Value Ref Range   Retic Ct Pct 2.3 0.4 - 3.1 %   RBC. 2.57 (L) 3.87 - 5.11 MIL/uL   Retic  Count, Manual 59.1 19.0 - 186.0 K/uL  Basic metabolic panel     Status: Abnormal   Collection Time: 11/30/14  7:23 PM  Result Value Ref Range   Sodium 139 135 - 145 mmol/L   Potassium 3.4 (L) 3.5 - 5.1 mmol/L   Chloride 101 96 - 112 mmol/L   CO2 31 19 - 32 mmol/L   Glucose, Bld 105 (H) 70 - 99 mg/dL   BUN 12 6 - 23 mg/dL   Creatinine, Ser 7.93 0.50 - 1.10 mg/dL   Calcium 8.6 8.4 - 68.2 mg/dL   GFR calc non Af Amer 54 (L) >90 mL/min   GFR calc Af Amer 62 (L) >90 mL/min    Comment: (NOTE) The eGFR has been calculated using the CKD EPI equation. This calculation has not been validated in all clinical situations. eGFR's persistently <90 mL/min signify possible Chronic Kidney Disease.    Anion gap 7 5 - 15  CBC     Status: Abnormal   Collection Time: 12/01/14  6:40 AM  Result Value Ref Range   WBC 5.8 4.0 - 10.5 K/uL   RBC 3.03 (L) 3.87 - 5.11 MIL/uL   Hemoglobin 8.1 (L) 12.0 - 15.0 g/dL    Comment: REPEATED TO VERIFY POST TRANSFUSION SPECIMEN    HCT 25.3 (L) 36.0 - 46.0 %   MCV 83.5 78.0 - 100.0 fL   MCH 26.7 26.0 - 34.0 pg   MCHC 32.0 30.0 - 36.0 g/dL   RDW 86.1 (H) 37.1 - 24.4 %   Platelets 255 150 - 400 K/uL    Micro Results: No results found for this or any previous visit (from the past 240 hour(s)). Studies/Results: Dg Chest 2 View  11/29/2014   CLINICAL DATA:  Initial evaluation for dyspnea for 2 weeks with bilateral lower extremity edema  EXAM: CHEST  2 VIEW  COMPARISON:  04/26/2014  FINDINGS: Mild cardiac enlargement, which represents a change from the prior study. Mild diffuse interstitial prominence. Stable aortic arch calcification. Stable left lower lobe granuloma. Small bilateral pleural effusions. Hyperinflation consistent with COPD. Diffuse osteopenia.  IMPRESSION: Superimposed on COPD, there is increased heart size which is new from 2015. The possibility of pericardial effusion is not excluded. Mild diffuse interstitial prominence is also noted with pleural  effusions, suggesting congestive heart failure with mild interstitial pulmonary edema.   Electronically Signed   By: Esperanza Heir M.D.   On: 11/29/2014 17:47   Dg Ribs Unilateral Left  11/30/2014  CLINICAL DATA:  LEFT posterior rib pain below diaphragm, frequent falls at home though uncertain which day/fall resulted in this pain  EXAM: LEFT RIBS - 2 VIEW  COMPARISON:  Chest radiograph 11/29/2014  FINDINGS: Osseous demineralization.  Calcified granuloma lower LEFT lung.  Extensive atherosclerotic calcification aorta.  BB placed at site of symptoms at LEFT inferior ribs laterally.  No rib fracture or bone destruction identified.  LEFT pleural effusion and basilar atelectasis noted.  IMPRESSION: LEFT pleural effusion and basilar atelectasis.  No definite acute LEFT rib abnormalities.   Electronically Signed   By: Ulyses Southward M.D.   On: 11/30/2014 18:03   Medications: I have reviewed the patient's current medications. Scheduled Meds: . aspirin EC  81 mg Oral Daily  . atorvastatin  10 mg Oral q1800  . cholecalciferol  400 Units Oral Daily   And  . calcium carbonate  1 tablet Oral Q breakfast  . docusate sodium  100 mg Oral Daily  . dorzolamide-timolol  1 drop Both Eyes BID  . feeding supplement (RESOURCE BREEZE)  1 Container Oral BID BM  . heparin  5,000 Units Subcutaneous 3 times per day  . lisinopril  20 mg Oral Daily   And  . hydrochlorothiazide  12.5 mg Oral Daily  . latanoprost  1 drop Both Eyes QHS  . levothyroxine  75 mcg Oral QAC breakfast  . multivitamin with minerals  1 tablet Oral Daily  . pantoprazole  40 mg Oral Daily   Continuous Infusions:  PRN Meds:.albuterol, traMADol Assessment/Plan:  Assessment & Plan by Problem:  OLAR SANTINI is a 81 yo F with PMH of Graves disease s/p RAI in July 2015 now hypothryoid, history of gastric AVM (last EGD 04/2013), as well as HTN, HLD, well controlled DM2, and mild anemia who presents with symptomatic anemia and acute CHF exacerbation.     Principal Problem:  Symptomatic anemia Active Problems:  Diabetes mellitus, type 2  Tobacco abuse  Bilateral lower extremity edema  Postablative hypothyroidism  Left flank pain  1. Symptomatic Anemia- Routine labs were checked in clinic and revealed her Hgb dropped to 6.3. Pt is chronically anemic with baseline Hgb around 11. She has a history of gastric AVMs requiring blood transfusions previously. S/p transfusion of 1 u pRBCs on 11/30/14 with increased in Hgb to 8.1  - iron panel: Iron 29 (low). UIBC 306. TIBC 335. Ferritin 28. Folate 10.3  - reticulocytes: 2.9%   - FOBT on stool samples x3 - Consider GI consult if signs of acute GI bleeding (BRBPR, melena, hemoptysis) otherwise can follow up as outpatient  2. Bilateral lower extremity edema: Concerning for CHF as elevated BNP, SOB, and edema with chest xray suggestive of pulmonary edema and cardiomegaly. May be exacerbated to hypothyroidism and anemia. EKG with T wave inversions unchanged from previous EKGs. Bradycardic.  - s/p IV lasix 20 mg to help with edema - Echocardiogram  -Monitor I&O - Daily weights - monitor BMP  3. Postablative Hypothyroidism: Pt with history of goiter s/p RAI on 03/2014, recently diagnosed with hypothyroidism in 06/2014, started on 50 mcg levothyroxine. TSH increased to 138 despite compliance with medications.  - Increase levothyroxine dose to   4. Left flank pain: Pt with history of frequent falls. Most recent fall in June 2015 resulted in fractured L rib. Pt continues to endorse pain in this region. L rib xrays negative for fracture. Consider muscle spasm.  - PT and OT eval for frequent falls and home health needs.  5. Diet controlled Type 2 Diabetes mellitus: Pt does not take medications at home. Well-controlled with HgbA1C of 6.8.  - Carb controlled diet   6. COPD: CXR with changes associated with COPD, extensive smoking history, mild wheezing on exam. No PFTs on file -  Albuterol PRN - Outpatient PFTs  6. PPx - DVT: heparin [place] - PT/OT consults for home health needs   Dispo: Disposition is deferred at this time, awaiting improvement of current medical problems. Anticipated discharge in approximately 1 day(s).   The patient does have a current PCP (Ejiroghene Arlyce Dice, MD) and does need an [facility] hospital follow-up appointment after discharge.  The patient does not have transportation limitations that hinder transportation to clinic appointments.Plan:   This is a Careers information officer Note.  The care of the patient was discussed with Dr. Heber [place] and the assessment and plan formulated with their assistance.  Please see their attached note for official documentation of the daily encounter.   Regino Schultze, Med Student 12/01/2014, 1:13 PM

## 2014-12-01 NOTE — Progress Notes (Signed)
UR completed 

## 2014-12-01 NOTE — Progress Notes (Signed)
[name] discharged Home per MD order.  Discharge instructions reviewed and discussed with the patient, all questions and concerns answered. Copy of instructions and care notes for new diagnosis, medication given to patient.    Medication List    STOP taking these medications        aspirin EC 81 MG tablet      TAKE these medications        atorvastatin 10 MG tablet  Commonly known as:  LIPITOR  Take 1 tablet (10 mg total) by mouth daily at 6 PM.     calcium citrate-vitamin D 315-200 MG-UNIT per tablet  Commonly known as:  CITRACAL+D  Take 2 tablets by mouth 2 (two) times daily.     diclofenac sodium 1 % Gel  Commonly known as:  VOLTAREN  Apply 2 g topically 4 (four) times daily.     dorzolamide-timolol 22.3-6.8 MG/ML ophthalmic solution  Commonly known as:  COSOPT  Place 1 drop into both eyes 2 (two) times daily.     DSS 100 MG Caps  Take 100 mg by mouth daily.     feeding supplement (RESOURCE BREEZE) Liqd  Take 1 Container by mouth 2 (two) times daily between meals.     ferrous sulfate 325 (65 FE) MG tablet  Take 1 tablet (325 mg total) by mouth 3 (three) times daily with meals.     furosemide 20 MG tablet  Commonly known as:  LASIX  Take 1 tablet (20 mg total) by mouth daily.     latanoprost 0.005 % ophthalmic solution  Commonly known as:  XALATAN  Place 1 drop into both eyes at bedtime.     levothyroxine 50 MCG tablet  Commonly known as:  SYNTHROID, LEVOTHROID  Take 1.5 tablets (75 mcg total) by mouth daily.     lisinopril-hydrochlorothiazide 20-12.5 MG per tablet  Commonly known as:  PRINZIDE,ZESTORETIC  TAKE 1 TABLET BY MOUTH EVERY DAY     multivitamin with minerals Tabs tablet  Take 1 tablet by mouth daily.     pantoprazole 40 MG tablet  Commonly known as:  PROTONIX  Take 40 mg by mouth daily.        Patients skin is clean, dry and intact, no evidence of skin break down. IV site discontinued and catheter remains intact. Site without signs  and symptoms of complications. Dressing and pressure applied.  Patient escorted to car by NT in a wheelchair,  no distress noted upon discharge.  Wynetta Emery, Shanyce Daris C 12/01/2014 4:13 PM

## 2014-12-01 NOTE — Progress Notes (Signed)
Dr. Tommy Medal made aware of patient's BP 190/55 this am prior to scheduled BP med administration. Orthostatic VS order placed.  Orthostatic VS taken following patient being supine for greater than 10 minutes  Lying- BP: 189/34 HR: 52  Sitting- BP: 189/37 HR: 50  Standing- BP 177/38 HR: 53  Standing 3 minutes: BP 151/43 HR 56  Patient denied feeling dizzy or any associated symptoms.

## 2014-12-01 NOTE — Progress Notes (Signed)
NCM spoke with patient she chose [facility] for hhpt, referral made to [facility], [place] notified. Soc will begin 24-48 hrs post dc.

## 2014-12-01 NOTE — Progress Notes (Signed)
  Echocardiogram 2D Echocardiogram has been performed.  Gina [name] 12/01/2014, 10:11 AM

## 2014-12-01 NOTE — Care Management Note (Signed)
    Page 1 of 1   12/01/2014     4:57:46 PM CARE MANAGEMENT NOTE 12/01/2014  Patient:  LEESHA, KYZER   Account Number:  [id number]  Date Initiated:  12/01/2014  Documentation initiated by:  Tomi Bamberger  Subjective/Objective Assessment:   dx anemia  admit- lives alone.     Action/Plan:   pt eval- hhpt   Anticipated DC Date:  12/01/2014   Anticipated DC Plan:  [place]  CM consult      [facility] Choice  HOME HEALTH   Choice offered to / List presented to:  C-1 Patient        [place] arranged  [facility] [facility]      [place].   Status of service:  Completed, signed off Medicare Important Message given?  NA - LOS <3 / Initial given by admissions (If response is "NO", the following Medicare IM given date fields will be blank) Date Medicare IM given:   Medicare IM given by:   Date Additional Medicare IM given:   Additional Medicare IM given by:    Discharge Disposition:  [place]  Per UR Regulation:  Reviewed for med. necessity/level of care/duration of stay  If discussed at [place] of Stay Meetings, dates discussed:    Comments:  12/01/14 1656 Tomi Bamberger RN BSN [phone]  patient chose [facility] for hhpt, referral made to [facility], [place] notified. Soc will begin 24-48 hrs post dc.

## 2014-12-01 NOTE — Evaluation (Signed)
Physical Therapy Evaluation Patient Details Name: Gina [name] MRN: [id number] DOB: [birth year] Today's Date: 12/01/2014   History of Present Illness  Pt admit with anemia.    Clinical Impression  Pt admitted with above diagnosis. Pt currently with functional limitations due to the deficits listed below (see PT Problem List). Pt ambulates better with RW with incr safety and incr balance.  Pt has a RW at home but did not use it PTA.  Pt agrees she will use it when she goes home for safety.  Will need HHPT as well.   Pt will benefit from skilled PT to increase their independence and safety with mobility to allow discharge to the venue listed below.      Follow Up Recommendations Home health PT    Equipment Recommendations  None recommended by PT    Recommendations for Other Services       Precautions / Restrictions Precautions Precautions: Fall Precaution Comments: Falls at home per pt Restrictions Weight Bearing Restrictions: No      Mobility  Bed Mobility Overal bed mobility: Independent                Transfers Overall transfer level: Independent                  Ambulation/Gait Ambulation/Gait assistance: Min assist;Mod assist Ambulation Distance (Feet): 350 Feet Assistive device: Rolling walker (2 wheeled) Gait Pattern/deviations: Step-through pattern;Decreased stride length;Decreased stance time - left;Decreased weight shift to left;Decreased step length - left;Staggering right;Drifts right/left;Narrow base of support   Gait velocity interpretation: Below normal speed for age/gender General Gait Details: Pt staggered significantly without device needing mod assist to recover.  Obtained RW which steadies pt to where pt is able to ambulate with RW safely without physical asssit and only occasional cues needed for step technique.    Stairs            Wheelchair Mobility    Modified Rankin (Stroke Patients Only)       Balance Overall balance  assessment: Needs assistance;History of Falls         Standing balance support: Bilateral upper extremity supported;During functional activity Standing balance-Leahy Scale: Fair Standing balance comment: can stand statically without UE support for up to aminute.              High level balance activites: Turns;Direction changes High Level Balance Comments: Performed above with cues and min guard assist with RW.  Did not lose balance.               Pertinent Vitals/Pain Pain Assessment: No/denies pain  VSS    Home Living Family/patient expects to be discharged to:: Private residence Living Arrangements: Alone Available Help at Discharge: Personal care attendant Type of Home: Apartment Home Access: Level entry     Home Layout: One level Home Equipment: Cane - single point;Shower seat;Walker - Psychologist, educational - 2 wheels Additional Comments: pt has an aide that comes 7 days/ wk for about 3 hrs/ day    Prior Function Level of Independence: Needs assistance   Gait / Transfers Assistance Needed: ambulates without AD in home; community takes cane  ADL's / Homemaking Assistance Needed: [place] aide assist with cooking and cleaning  Comments: pt drives herself to store and doctor's visits     Hand Dominance   Dominant Hand: Right    Extremity/Trunk Assessment   Upper Extremity Assessment: Defer to OT evaluation           Lower Extremity Assessment: Generalized  weakness;LLE deficits/detail   LLE Deficits / Details: grossly 3+/5  Cervical / Trunk Assessment: Normal  Communication   Communication: No difficulties  Cognition Arousal/Alertness: Awake/alert Behavior During Therapy: WFL for tasks assessed/performed Overall Cognitive Status: Within Functional Limits for tasks assessed                      General Comments      Exercises        Assessment/Plan    PT Assessment Patient needs continued PT services  PT Diagnosis Generalized weakness    PT Problem List Decreased mobility;Decreased balance;Decreased activity tolerance;Decreased strength;Decreased knowledge of use of DME;Decreased safety awareness;Decreased knowledge of precautions;Decreased coordination  PT Treatment Interventions DME instruction;Gait training;Functional mobility training;Therapeutic activities;Therapeutic exercise;Balance training;Patient/family education   PT Goals (Current goals can be found in the Care Plan section) Acute Rehab PT Goals Patient Stated Goal: to go  home PT Goal Formulation: With patient Time For Goal Achievement: 12/08/14 Potential to Achieve Goals: Good    Frequency Min 3X/week   Barriers to discharge Decreased caregiver support      Co-evaluation               End of Session Equipment Utilized During Treatment: Gait belt Activity Tolerance: Patient limited by fatigue Patient left: in chair;with call bell/phone within reach Nurse Communication: Mobility status    Functional Assessment Tool Used: clinical judgment Functional Limitation: Mobility: Walking and moving around Mobility: Walking and Moving Around Current Status [id number]): At least 40 percent but less than 60 percent impaired, limited or restricted Mobility: Walking and Moving Around Goal Status [phone]): At least 1 percent but less than 20 percent impaired, limited or restricted    Time: 0903-0926 PT Time Calculation (min) (ACUTE ONLY): 23 min   Charges:   PT Evaluation $Initial PT Evaluation Tier I: 1 Procedure PT Treatments $Gait Training: 8-22 mins   PT G Codes:   PT G-Codes **NOT FOR INPATIENT CLASS** Functional Assessment Tool Used: clinical judgment Functional Limitation: Mobility: Walking and moving around Mobility: Walking and Moving Around Current Status [id number]): At least 40 percent but less than 60 percent impaired, limited or restricted Mobility: Walking and Moving Around Goal Status [phone]): At least 1 percent but less than 20 percent impaired,  limited or restricted    Irwin Brakeman F 12/01/2014, 11:57 AM [facility] [phone] (pager)

## 2014-12-01 NOTE — Evaluation (Signed)
Occupational Therapy Evaluation and Discharge Patient Details Name: Gina [name] MRN: [id number] DOB: 08-17-1929 Today's Date: 12/01/2014    History of Present Illness Pt admit with anemia.     Clinical Impression   This 81 yo female admitted with above presents to acute OT with a h/o falls and has an aide 7 days/wk for 3 hours/day that helps her with IADLs and minimal BADLs. Feel with her HHPT this will improve her balance and increase her safety with BADLs as well. No further OT needs noted, we will sign off.    Follow Up Recommendations  No OT follow up    Equipment Recommendations  None recommended by OT       Precautions / Restrictions Precautions Precautions: Fall Precaution Comments: Falls at home per pt--normally to the left per pt due to "that is my weak leg" Restrictions Weight Bearing Restrictions: No      Mobility Bed Mobility Overal bed mobility: Independent             General bed mobility comments: Pt up in recliner upon arrival  Transfers Overall transfer level: Independent                    Balance Overall balance assessment: Needs assistance;History of Falls         Standing balance support: No upper extremity supported Standing balance-Leahy Scale: Fair Standing balance comment: static balance does not seem to be an issue, however with ambulating in hallway pt had 3 instances where she lost her balance to her left ("that is my bad leg") and self corrected by doing a cross-over step                 ADL Overall ADL's : Needs assistance/impaired                                       General ADL Comments: S due to history of falls               Pertinent Vitals/Pain Pain Assessment: No/denies pain     Hand Dominance Right   Extremity/Trunk Assessment Upper Extremity Assessment Upper Extremity Assessment: Overall WFL for tasks assessed     Communication Communication Communication: No  difficulties   Cognition Arousal/Alertness: Awake/alert Behavior During Therapy: WFL for tasks assessed/performed Overall Cognitive Status: Within Functional Limits for tasks assessed                                Home Living Family/patient expects to be discharged to:: Private residence Living Arrangements: Alone Available Help at Discharge: Personal care attendant (3 hours/day 7 days/wk) Type of Home: Apartment Home Access: Level entry     Home Layout: One level     Bathroom Shower/Tub: Tub/shower unit;Curtain Shower/tub characteristics: Architectural technologist: Standard Bathroom Accessibility: Yes   Home Equipment: Cane - single point;Shower seat;Walker - Psychologist, educational - 2 wheels;Grab bars - toilet;Grab bars - tub/shower   Additional Comments: pt has an aide that comes 7 days/ wk for about 3 hrs/ day      Prior Functioning/Environment Level of Independence: Needs assistance  Gait / Transfers Assistance Needed: ambulates without AD in home; community takes cane ADL's / Homemaking Assistance Needed: [place] aide assist with cooking and cleaning and in/out of tub to seat   Comments: pt drives herself to store  and doctor's visits    OT Diagnosis: Generalized weakness         OT Goals(Current goals can be found in the care plan section) Acute Rehab OT Goals Patient Stated Goal: home soon  OT Frequency:                End of Session    Activity Tolerance: Patient tolerated treatment well Patient left: in chair;with call bell/phone within reach;with family/visitor present   Time: [id number] OT Time Calculation (min): 19 min Charges:  OT General Charges $OT Visit: 1 Procedure OT Evaluation $Initial OT Evaluation Tier I: 1 Procedure  Almon Register [id number] 12/01/2014, 12:50 PM

## 2014-12-02 DIAGNOSIS — I739 Peripheral vascular disease, unspecified: Secondary | ICD-10-CM | POA: Diagnosis not present

## 2014-12-02 NOTE — Discharge Summary (Signed)
Name: Gina [name] MRN: [id number] DOB: 12-29-1928 81 y.o. PCP: Bethena Roys, MD  Date of Admission: 11/30/2014 12:00 PM Date of Discharge: 12/01/2014 Attending Physician: Dr. Tommy Medal  Discharge Diagnosis: Principal Problem:   Symptomatic anemia Active Problems:   Diabetes mellitus, type 2   Tobacco abuse   Acute diastolic congestive heart failure   Postablative hypothyroidism   Left flank pain   COPD, severity to be determined   Essential hypertension   Orthostasis  Discharge Medications:   Medication List    STOP taking these medications        aspirin EC 81 MG tablet      TAKE these medications        atorvastatin 10 MG tablet  Commonly known as:  LIPITOR  Take 1 tablet (10 mg total) by mouth daily at 6 PM.     calcium citrate-vitamin D 315-200 MG-UNIT per tablet  Commonly known as:  CITRACAL+D  Take 2 tablets by mouth 2 (two) times daily.     diclofenac sodium 1 % Gel  Commonly known as:  VOLTAREN  Apply 2 g topically 4 (four) times daily.     dorzolamide-timolol 22.3-6.8 MG/ML ophthalmic solution  Commonly known as:  COSOPT  Place 1 drop into both eyes 2 (two) times daily.     DSS 100 MG Caps  Take 100 mg by mouth daily.     feeding supplement (RESOURCE BREEZE) Liqd  Take 1 Container by mouth 2 (two) times daily between meals.     ferrous sulfate 325 (65 FE) MG tablet  Take 1 tablet (325 mg total) by mouth 3 (three) times daily with meals.     furosemide 20 MG tablet  Commonly known as:  LASIX  Take 1 tablet (20 mg total) by mouth daily.     latanoprost 0.005 % ophthalmic solution  Commonly known as:  XALATAN  Place 1 drop into both eyes at bedtime.     levothyroxine 50 MCG tablet  Commonly known as:  SYNTHROID, LEVOTHROID  Take 1.5 tablets (75 mcg total) by mouth daily.     lisinopril-hydrochlorothiazide 20-12.5 MG per tablet  Commonly known as:  PRINZIDE,ZESTORETIC  TAKE 1 TABLET BY MOUTH EVERY DAY     multivitamin with  minerals Tabs tablet  Take 1 tablet by mouth daily.     pantoprazole 40 MG tablet  Commonly known as:  PROTONIX  Take 40 mg by mouth daily.        Disposition and follow-up:   Gina [name] was discharged from [facility] in Stable condition.  At the hospital follow up visit please address:  1.  Anemia: Please repeat CBC, assess Oral iron compliance.  Patient has been referred to GI for eval for blood loss  2. Hypothyroid: synthroid increased to 97mcg. May need further titration based on symptosm.  Please assess symptoms, will need repeat TSH in a few months.  3. DCHF: please assess volume status  4. COPD: consider outpatient PFTs  5.  Labs / imaging needed at time of follow-up: CBC, BMP  6.  Pending labs/ test needing follow-up: None  Follow-up Appointments:     Follow-up Information    Follow up with [facility].   Why:  hhpt   Contact information:   [address] [place] [address] [phone]       Follow up with Otho Bellows, MD On 12/06/2014.   Specialty:  Internal Medicine   Why:  at 9:15am. Please arrive 15 minutes before your appointment   Contact information:   [place] [address] [phone]       Follow up with Beryle Beams, MD On 12/05/2014.   Specialty:  Gastroenterology   Why:  10:45 am; please arrive 15 mins before scheduled appointment   Contact information:   [address] [place] [address] [id number]       Discharge Instructions: Discharge Instructions    Diet - low sodium heart healthy    Complete by:  As directed      Discharge instructions    Complete by:  As directed   Thank you for allowing [place] to be involved in your healthcare while you were hospitalized at [facility].  Please note that there have been changes to your home medications.  --> PLEASE LOOK AT YOUR DISCHARGE MEDICATION LIST FOR DETAILS. Please be sure to take  your iron pills as prescribed. These may cause constipation, so please take colace as needed for bowel movements.    Please call your PCP if you have any questions or concerns, or any difficulty getting any of your medications. Please return to the ER if you have worsening of your symptoms or new severe symptoms arise.     Face-to-face encounter (required for Medicare/Medicaid patients)    Complete by:  As directed   I Jessee Avers certify that this patient is under my care and that I, or a nurse practitioner or physician's assistant working with me, had a face-to-face encounter that meets the physician face-to-face encounter requirements with this patient on 12/01/2014. The encounter with the patient was in whole, or in part for the following medical condition(s) which is the primary reason for home health care (List medical condition): Dizziness with orthostasis, diabetes, falls, anemia, bilateral lower extremity edema.  The encounter with the patient was in whole, or in part, for the following medical condition, which is the primary reason for home health care:  Dizziness  I certify that, based on my findings, the following services are medically necessary home health services:  Physical therapy  Reason for Medically Necessary Home Health Services:  Skilled Nursing- Change/Decline in Patient Status  My clinical findings support the need for the above services:  Unsafe ambulation due to balance issues  Further, I certify that my clinical findings support that this patient is homebound due to:  Unable to leave home safely without assistance     Home Health    Complete by:  As directed   To provide the following care/treatments:  PT     Increase activity slowly    Complete by:  As directed            Consultations:    Procedures Performed:  Dg Chest 2 View  11/29/2014   CLINICAL DATA:  Initial evaluation for dyspnea for 2 weeks with bilateral lower extremity edema  EXAM: CHEST  2 VIEW   COMPARISON:  04/26/2014  FINDINGS: Mild cardiac enlargement, which represents a change from the prior study. Mild diffuse interstitial prominence. Stable aortic arch calcification. Stable left lower lobe granuloma. Small bilateral pleural effusions. Hyperinflation consistent with COPD. Diffuse osteopenia.  IMPRESSION: Superimposed on COPD, there is increased heart size which is new from 2015. The possibility of pericardial effusion is not excluded. Mild diffuse interstitial prominence is also noted with pleural effusions, suggesting congestive heart failure with mild interstitial pulmonary edema.   Electronically Signed   By: Skipper Cliche  M.D.   On: 11/29/2014 17:47   Dg Ribs Unilateral Left  11/30/2014   CLINICAL DATA:  LEFT posterior rib pain below diaphragm, frequent falls at home though uncertain which day/fall resulted in this pain  EXAM: LEFT RIBS - 2 VIEW  COMPARISON:  Chest radiograph 11/29/2014  FINDINGS: Osseous demineralization.  Calcified granuloma lower LEFT lung.  Extensive atherosclerotic calcification aorta.  BB placed at site of symptoms at LEFT inferior ribs laterally.  No rib fracture or bone destruction identified.  LEFT pleural effusion and basilar atelectasis noted.  IMPRESSION: LEFT pleural effusion and basilar atelectasis.  No definite acute LEFT rib abnormalities.   Electronically Signed   By: Lavonia Dana M.D.   On: 11/30/2014 18:03    2D Echo: Study Conclusions  - Left ventricle: The cavity size was normal. Systolic function was normal. The estimated ejection fraction was in the range of 55% to 60%. Wall motion was normal; there were no regional wall motion abnormalities. Features are consistent with a pseudonormal left ventricular filling pattern, with concomitant abnormal relaxation and increased filling pressure (grade 2 diastolic dysfunction). Doppler parameters are consistent with both elevated ventricular end-diastolic filling pressure and  elevated left atrial filling pressure. - Mitral valve: Calcified annulus. - Left atrium: The atrium was mildly dilated. - Atrial septum: No defect or patent foramen ovale was identified. - Pericardium, extracardiac: There was a left pleural effusion.    Admission HPI: Gina [name] is a 81 yo F with PMH of Graves disease s/p RAI in July 2015 now hypothryoid, history of gastric AVM (last EGD 04/2013), as well as HTN, HLD, well controlled DM2, and mild anemia.  She presented to the [facility] yesterday 11/29/14 with chief complaint of dyspnea on exertion and bilateral lower extremity edema. Pt complains of 1 month history of fatigue and dyspnea on moderate exertion. Pt complains of difficulty completing daily activities due to these symptoms. For the last 2 weeks, she has noticed marked swelling of her lower extremities bilaterally. Has never had these symptoms before.   Routine labs were checked and revealed her Hgb dropped to 6.3. Last Hgb checked on 03/2014 was 11. Pt is chronically anemic with baseline Hgb around 11. She has a history of gastric AVMs requiring blood transfusions previously. Endorses fatigue. Denies chest pain, palpitations, dizziness, loss of consciousness. Denies BRBPR, hematochezia, melena. No hemoptysis. She does report she takes iron supplement once or twice a week.  TSH checked yesterday for recent diagnosis of hypothyroidism showed TSH 138, increased from 47 at time of diagnosis in 06/2014. Pt was started on levothyroxine 50 mcg. Endorses compliance with medication, medication bottle suggests she is taking this (few pills remaining, and daughter confirms her taking this medication). Endorses constipation, fatigue. No recent changes in weight.   Her last echo was in 2009: with an EF of 55-65% no regional wall motion abnormalities.  Hospital Course by problem list:  Gina [name] is a 81 yo F with PMH of Graves disease s/p RAI in July 2015 now hypothyroid, history of  gastric AVM (last EGD 04/2013), HTN, HLD, well controlled DM2, and mild chronic anemia who presents with symptomatic anemia and acute CHF exacerbation.  Her hospital course is outlined below per problem.     Symptomatic Iron Deficiency anemia - Hgb on admission was 6.6mg /dL, we transfused her 1 unit and she improved to 8.1mg /dL. With great improvement of her fatigue and weakness.  We obtained an anemia panel which suggested iron deficiency anemia.  She did not  have any reported melena or rectal blood loss.  She did not have any bowel movements during her visit but does have a history of gastric AVMs.  We suspected chronic GI blood loss and have started her on oral iron supplementation  Symptomatic Anemia- The patient was given 1 u pRBC with an appropriate increase in Hgb from 6.3 to 8.1. She has a history of gastric AVMs s/p ablation and requiring blood transfusions. Her iron studies are suggestive of an iron deficiency anemia. She did not have any bowel movements during her visit.  We suspected chronic GI blood loss and have started her on oral iron supplementation She should continue iron supplementation and follow up with GI and outpatient IM clinic.   Acute diastolic CHF exacerbation: The patient was admitted with symptoms concerning for CHF as she had shortness of breath and bilateral peripheral edema with chest xray suggestive of CHF and an elevated BNP. This episode may be exacerbated by hypothyroidism and anemia. Her EKG showed unchanged T wave inversions and stable bradycardia. She was hemodynamically stable throughout this hospitalization.  She was treated with 20 mg IV lasix 20 mg and echocardiogram on 12/01/2014 showed a normal EF with grade 2 DD.  She had good response to IV lasix and was discharged home on oral lasix 20mg  daily.   Postablative Hypothyroidism: Pt with history of goiter s/p RAI on 03/2014, now hypothyroid. We increased her levothyroxine dose to 75 mcg as TSH was 138 despite  compliance with medications.    Left flank pain: Pt has a history of frequent falls, with the most recent fall in June 2015 that resulted in fractured L rib. Pt continues to endorse pain in this region. L rib xrays at this time were negative for fracture. We did obtain home health PT for her.  COPD: The patients CXR demonstrated changes associated with COPD, extensive smoking history, mild wheezing on exam.  No PFTs on file. She was treated with albuterol prn which she did not need during this admission and we recommend outpatient PFTs.   The patient was maintained on a carbohydrate controlled diet for type 2 diabetes as she does not take medications at home.   Discharge Vitals:   BP 197/53 mmHg  Pulse 56  Temp(Src) 98.8 F (37.1 C) (Oral)  Resp 20  Ht 5\' 7"  (1.702 m)  Wt 112 lb 7 oz (51 kg)  BMI 17.61 kg/m2  SpO2 96%  Discharge Labs:  No results found for this or any previous visit (from the past 24 hour(s)).  Signed: Lucious Groves, DO 12/02/2014, 8:07 PM    Services Ordered on Discharge: [place] Ordered on Discharge: none

## 2014-12-04 DIAGNOSIS — I739 Peripheral vascular disease, unspecified: Secondary | ICD-10-CM | POA: Diagnosis not present

## 2014-12-05 ENCOUNTER — Ambulatory Visit (HOSPITAL_COMMUNITY): Admission: RE | Admit: 2014-12-05 | Payer: Medicare Other | Source: Ambulatory Visit

## 2014-12-05 ENCOUNTER — Other Ambulatory Visit: Payer: Self-pay | Admitting: Gastroenterology

## 2014-12-05 ENCOUNTER — Telehealth: Payer: Self-pay | Admitting: Internal Medicine

## 2014-12-05 DIAGNOSIS — D500 Iron deficiency anemia secondary to blood loss (chronic): Secondary | ICD-10-CM | POA: Diagnosis not present

## 2014-12-05 DIAGNOSIS — K31811 Angiodysplasia of stomach and duodenum with bleeding: Secondary | ICD-10-CM | POA: Diagnosis not present

## 2014-12-05 DIAGNOSIS — I739 Peripheral vascular disease, unspecified: Secondary | ICD-10-CM | POA: Diagnosis not present

## 2014-12-05 DIAGNOSIS — E0590 Thyrotoxicosis, unspecified without thyrotoxic crisis or storm: Secondary | ICD-10-CM | POA: Diagnosis not present

## 2014-12-05 NOTE — Telephone Encounter (Signed)
Call to patient to confirm appointment for 12/06/14 at 9:15 lmtcb

## 2014-12-06 ENCOUNTER — Encounter (HOSPITAL_COMMUNITY): Payer: Self-pay | Admitting: *Deleted

## 2014-12-06 ENCOUNTER — Ambulatory Visit: Payer: Medicare Other | Admitting: Internal Medicine

## 2014-12-06 DIAGNOSIS — F17200 Nicotine dependence, unspecified, uncomplicated: Secondary | ICD-10-CM | POA: Diagnosis not present

## 2014-12-06 DIAGNOSIS — R600 Localized edema: Secondary | ICD-10-CM | POA: Diagnosis not present

## 2014-12-06 DIAGNOSIS — E0500 Thyrotoxicosis with diffuse goiter without thyrotoxic crisis or storm: Secondary | ICD-10-CM | POA: Diagnosis not present

## 2014-12-06 DIAGNOSIS — D649 Anemia, unspecified: Secondary | ICD-10-CM | POA: Diagnosis not present

## 2014-12-06 DIAGNOSIS — I739 Peripheral vascular disease, unspecified: Secondary | ICD-10-CM | POA: Diagnosis not present

## 2014-12-06 DIAGNOSIS — E119 Type 2 diabetes mellitus without complications: Secondary | ICD-10-CM | POA: Diagnosis not present

## 2014-12-07 DIAGNOSIS — I739 Peripheral vascular disease, unspecified: Secondary | ICD-10-CM | POA: Diagnosis not present

## 2014-12-07 NOTE — Anesthesia Preprocedure Evaluation (Addendum)
Anesthesia Evaluation  Patient identified by MRN, date of birth, ID band Patient awake    Reviewed: Allergy & Precautions, NPO status , Patient's Chart, lab work & pertinent test results  Airway Mallampati: II   Neck ROM: Full    Dental  (+) Edentulous Upper, Edentulous Lower   Pulmonary COPDCurrent Smoker,  breath sounds clear to auscultation        Cardiovascular hypertension, Pt. on medications Rhythm:Regular  ECHO 11/2014 EF 55-60%   Neuro/Psych Carotids 2015 <49% stenosis Bilat    GI/Hepatic PUD, Gastric AVM with bleeding tendency   Endo/Other  diabetes, Type 2  Renal/GU      Musculoskeletal   Abdominal (+)  Abdomen: soft.    Peds  Hematology  (+) anemia , 8/25   Anesthesia Other Findings   Reproductive/Obstetrics                            Anesthesia Physical Anesthesia Plan  ASA: III  Anesthesia Plan: MAC   Post-op Pain Management:    Induction: Intravenous  Airway Management Planned: Nasal Cannula  Additional Equipment:   Intra-op Plan:   Post-operative Plan:   Informed Consent: I have reviewed the patients History and Physical, chart, labs and discussed the procedure including the risks, benefits and alternatives for the proposed anesthesia with the patient or authorized representative who has indicated his/her understanding and acceptance.     Plan Discussed with:   Anesthesia Plan Comments: (IF GI anticipating likely heavy bleeding during procedure would opt for intubation and controlled airway)        Anesthesia Quick Evaluation

## 2014-12-08 ENCOUNTER — Ambulatory Visit (HOSPITAL_COMMUNITY)
Admission: RE | Admit: 2014-12-08 | Discharge: 2014-12-08 | Disposition: A | Discharge status: Home / Self Care | Payer: Medicare Other | Source: Ambulatory Visit | Attending: Gastroenterology | Admitting: Gastroenterology

## 2014-12-08 ENCOUNTER — Ambulatory Visit (HOSPITAL_COMMUNITY): Payer: Medicare Other | Admitting: Anesthesiology

## 2014-12-08 ENCOUNTER — Encounter (HOSPITAL_COMMUNITY)
Admission: RE | Disposition: A | Discharge status: Home / Self Care | Payer: Self-pay | Source: Ambulatory Visit | Attending: Gastroenterology

## 2014-12-08 ENCOUNTER — Encounter (HOSPITAL_COMMUNITY): Payer: Self-pay

## 2014-12-08 DIAGNOSIS — D509 Iron deficiency anemia, unspecified: Secondary | ICD-10-CM | POA: Diagnosis not present

## 2014-12-08 DIAGNOSIS — Z8673 Personal history of transient ischemic attack (TIA), and cerebral infarction without residual deficits: Secondary | ICD-10-CM | POA: Diagnosis not present

## 2014-12-08 DIAGNOSIS — J449 Chronic obstructive pulmonary disease, unspecified: Secondary | ICD-10-CM | POA: Diagnosis not present

## 2014-12-08 DIAGNOSIS — Z79899 Other long term (current) drug therapy: Secondary | ICD-10-CM | POA: Insufficient documentation

## 2014-12-08 DIAGNOSIS — E119 Type 2 diabetes mellitus without complications: Secondary | ICD-10-CM | POA: Insufficient documentation

## 2014-12-08 DIAGNOSIS — Q2733 Arteriovenous malformation of digestive system vessel: Secondary | ICD-10-CM | POA: Diagnosis not present

## 2014-12-08 DIAGNOSIS — I10 Essential (primary) hypertension: Secondary | ICD-10-CM | POA: Diagnosis not present

## 2014-12-08 DIAGNOSIS — E890 Postprocedural hypothyroidism: Secondary | ICD-10-CM | POA: Insufficient documentation

## 2014-12-08 DIAGNOSIS — H9190 Unspecified hearing loss, unspecified ear: Secondary | ICD-10-CM | POA: Insufficient documentation

## 2014-12-08 DIAGNOSIS — K31819 Angiodysplasia of stomach and duodenum without bleeding: Secondary | ICD-10-CM | POA: Diagnosis not present

## 2014-12-08 DIAGNOSIS — K219 Gastro-esophageal reflux disease without esophagitis: Secondary | ICD-10-CM | POA: Insufficient documentation

## 2014-12-08 DIAGNOSIS — H409 Unspecified glaucoma: Secondary | ICD-10-CM | POA: Diagnosis not present

## 2014-12-08 DIAGNOSIS — K31811 Angiodysplasia of stomach and duodenum with bleeding: Secondary | ICD-10-CM | POA: Diagnosis not present

## 2014-12-08 DIAGNOSIS — Z7982 Long term (current) use of aspirin: Secondary | ICD-10-CM | POA: Insufficient documentation

## 2014-12-08 DIAGNOSIS — I739 Peripheral vascular disease, unspecified: Secondary | ICD-10-CM | POA: Diagnosis not present

## 2014-12-08 HISTORY — PX: HOT HEMOSTASIS: SHX5433

## 2014-12-08 HISTORY — PX: ESOPHAGOGASTRODUODENOSCOPY (EGD) WITH PROPOFOL: SHX5813

## 2014-12-08 HISTORY — DX: Acute diastolic (congestive) heart failure: I50.31

## 2014-12-08 HISTORY — DX: Personal history of other medical treatment: Z92.89

## 2014-12-08 SURGERY — ESOPHAGOGASTRODUODENOSCOPY (EGD) WITH PROPOFOL
Anesthesia: Monitor Anesthesia Care

## 2014-12-08 MED ORDER — LIDOCAINE HCL (CARDIAC) 20 MG/ML IV SOLN
INTRAVENOUS | Status: AC
  Filled 2014-12-08: qty 5

## 2014-12-08 MED ORDER — PROPOFOL 10 MG/ML IV BOLUS
INTRAVENOUS | Status: DC | PRN
  Administered 2014-12-08 (×2): 20 mg via INTRAVENOUS

## 2014-12-08 MED ORDER — SODIUM CHLORIDE 0.9 % IV SOLN: INTRAVENOUS | Status: DC

## 2014-12-08 MED ORDER — LACTATED RINGERS IV SOLN
INTRAVENOUS | Status: DC
  Administered 2014-12-08: 1000 mL via INTRAVENOUS

## 2014-12-08 MED ORDER — PROPOFOL INFUSION 10 MG/ML OPTIME
INTRAVENOUS | Status: DC | PRN
  Administered 2014-12-08: 120 ug/kg/min via INTRAVENOUS

## 2014-12-08 MED ORDER — MEPERIDINE HCL 100 MG/ML IJ SOLN: 6.2500 mg | INTRAMUSCULAR | Status: DC | PRN

## 2014-12-08 MED ORDER — PROMETHAZINE HCL 25 MG/ML IJ SOLN: 6.2500 mg | INTRAMUSCULAR | Status: DC | PRN

## 2014-12-08 MED ORDER — LIDOCAINE HCL (CARDIAC) 20 MG/ML IV SOLN
INTRAVENOUS | Status: DC | PRN
  Administered 2014-12-08: 75 mg via INTRAVENOUS

## 2014-12-08 MED ORDER — FENTANYL CITRATE 0.05 MG/ML IJ SOLN: 25.0000 ug | INTRAMUSCULAR | Status: DC | PRN

## 2014-12-08 MED ORDER — PROPOFOL 10 MG/ML IV BOLUS
INTRAVENOUS | Status: AC
  Filled 2014-12-08: qty 20

## 2014-12-08 SURGICAL SUPPLY — 15 items

## 2014-12-08 NOTE — Transfer of Care (Signed)
Immediate Anesthesia Transfer of Care Note  Patient: Gina [name]  Procedure(s) Performed: Procedure(s): ESOPHAGOGASTRODUODENOSCOPY (EGD) WITH PROPOFOL (N/A) HOT HEMOSTASIS (ARGON PLASMA COAGULATION/BICAP) (N/A)  Patient Location: PACU and Endoscopy Unit  Anesthesia Type:MAC  Level of Consciousness: awake and patient cooperative  Airway & Oxygen Therapy: Patient Spontanous Breathing and Patient connected to nasal cannula oxygen  Post-op Assessment: Report given to RN and Post -op Vital signs reviewed and stable  Post vital signs: Reviewed and stable  Last Vitals:  Filed Vitals:   12/08/14 1001  BP: 164/98  Pulse:   Temp:   Resp:     Complications: No apparent anesthesia complications

## 2014-12-08 NOTE — Interval H&P Note (Signed)
History and Physical Interval Note:  12/08/2014 11:01 AM  Gina [name]  has presented today for surgery, with the diagnosis of AVMs  The various methods of treatment have been discussed with the patient and family. After consideration of risks, benefits and other options for treatment, the patient has consented to  Procedure(s): ESOPHAGOGASTRODUODENOSCOPY (EGD) WITH PROPOFOL (N/A) HOT HEMOSTASIS (ARGON PLASMA COAGULATION/BICAP) (N/A) as a surgical intervention .  The patient's history has been reviewed, patient examined, no change in status, stable for surgery.  I have reviewed the patient's chart and labs.  Questions were answered to the patient's satisfaction.     Gerhard Rappaport D

## 2014-12-08 NOTE — H&P (View-Only) (Signed)
  Date: 12/01/2014  Patient name: Gina [name]  Medical record number: [id number]  Date of birth: [birth year]   I have seen and evaluated Gina [name] and discussed their care with the Residency Team.   Assessment and Plan: I have seen and evaluated the patient as outlined above. I agree with the formulated Assessment and Plan as detailed in the residents' admission note, with the following changes:   Briefly this is an 81-year-old lady with history of Graves' disease status post ablation and post thyroid ablation hypothyroidism history of gastric AV malformation with GI bleed history of hypertension hyperlipidemia and diabetes mellitus.  She came to the clinic yesterday with complaints of dyspnea on exertion and bilateral lower extremity edema that was new. She also relates a one-month history of fatigue and dyspnea on moderate exertion. She was found to be profoundly anemic with a hemoglobin of 6.3 down from 11 in July. Her blood work here shows her to have significant iron deficiency anemia with a ferritin that is quite low.  Additionally labs checked included a thyroid signaling hormone which was 138.  Patient had been taking levothyroxin 50 mic grams a day but taking it with her medications and with food.  We made her overnight gave her a blood transfusion as well as diuresis for her lower extremity edema. We are checking an echocardiogram.  Today this morning she seemed comfortable walking about her room and then sitting back down and conversing with [place]. Her exam was notable for clear lungs and bradycardia, abdomen was soft. Neuro nonfocal.  #1 Profound anemia: Obviously there is concern about bleed from her AV malformation. We're trying to get stool guaiacs checked. I don't think that she has an acute bleed right now but I do certainly think that she not must have reassessment of her upper GI system with an upper endoscopy in the very near future. We should ensure that if she is that  she would have this if not during this hospitalization very shortly after discharge.  #2 post ablative profound hypothyroidism: Increased her thyroid dose to 75 g of levothyroxine and asked her to take this now on an empty stomach.  #3 hypertension she was severely hypertensive this morning on blood pressure check and her blood pressure in the bed and sitting was in the 190s. Girtha Rm checked her OS vitals and pts BP did drop to 151 from supine of 189 after standing for 3 minutes.   Given her hx of prior fall we will need to be cautious about being TOO aggressive about BP control.   In order to avoid a fall from orthostatic BP drop might be best to target her standing BP  #3 DM: well controlled      Truman Hayward, MD 3/3/20161:30 PM

## 2014-12-08 NOTE — Procedures (Signed)
Official report in [facility] is pending.  The enteroscopy was performed and the proximal jejunum was reached.  Several classic appearing AVMs were identified and ablated with APC.  Other suspicious sites were noted an treated with APC.  No active bleeding during the procedure.

## 2014-12-08 NOTE — Anesthesia Postprocedure Evaluation (Signed)
  Anesthesia Post-op Note  Patient: Gina [name]  Procedure(s) Performed: Procedure(s): ESOPHAGOGASTRODUODENOSCOPY (EGD) WITH PROPOFOL (N/A) HOT HEMOSTASIS (ARGON PLASMA COAGULATION/BICAP) (N/A)  Patient Location: PACU and Endoscopy Unit  Anesthesia Type:MAC  Level of Consciousness: awake and alert   Airway and Oxygen Therapy: Patient Spontanous Breathing and Patient connected to nasal cannula oxygen  Post-op Pain: none  Post-op Assessment: Post-op Vital signs reviewed, Patient's Cardiovascular Status Stable, Respiratory Function Stable and Patent Airway  Post-op Vital Signs: Reviewed and stable  Last Vitals:  Filed Vitals:   12/08/14 1001  BP: 164/98  Pulse:   Temp:   Resp:     Complications: No apparent anesthesia complications

## 2014-12-09 ENCOUNTER — Encounter (HOSPITAL_COMMUNITY): Payer: Self-pay | Admitting: Gastroenterology

## 2014-12-09 DIAGNOSIS — I739 Peripheral vascular disease, unspecified: Secondary | ICD-10-CM | POA: Diagnosis not present

## 2014-12-10 DIAGNOSIS — I739 Peripheral vascular disease, unspecified: Secondary | ICD-10-CM | POA: Diagnosis not present

## 2014-12-12 DIAGNOSIS — I739 Peripheral vascular disease, unspecified: Secondary | ICD-10-CM | POA: Diagnosis not present

## 2014-12-13 DIAGNOSIS — E0500 Thyrotoxicosis with diffuse goiter without thyrotoxic crisis or storm: Secondary | ICD-10-CM | POA: Diagnosis not present

## 2014-12-13 DIAGNOSIS — R600 Localized edema: Secondary | ICD-10-CM | POA: Diagnosis not present

## 2014-12-13 DIAGNOSIS — D649 Anemia, unspecified: Secondary | ICD-10-CM | POA: Diagnosis not present

## 2014-12-13 DIAGNOSIS — E119 Type 2 diabetes mellitus without complications: Secondary | ICD-10-CM | POA: Diagnosis not present

## 2014-12-13 DIAGNOSIS — F17200 Nicotine dependence, unspecified, uncomplicated: Secondary | ICD-10-CM | POA: Diagnosis not present

## 2014-12-13 DIAGNOSIS — I739 Peripheral vascular disease, unspecified: Secondary | ICD-10-CM | POA: Diagnosis not present

## 2014-12-14 DIAGNOSIS — I739 Peripheral vascular disease, unspecified: Secondary | ICD-10-CM | POA: Diagnosis not present

## 2014-12-15 DIAGNOSIS — R600 Localized edema: Secondary | ICD-10-CM | POA: Diagnosis not present

## 2014-12-15 DIAGNOSIS — E119 Type 2 diabetes mellitus without complications: Secondary | ICD-10-CM | POA: Diagnosis not present

## 2014-12-15 DIAGNOSIS — D649 Anemia, unspecified: Secondary | ICD-10-CM | POA: Diagnosis not present

## 2014-12-15 DIAGNOSIS — E0500 Thyrotoxicosis with diffuse goiter without thyrotoxic crisis or storm: Secondary | ICD-10-CM | POA: Diagnosis not present

## 2014-12-15 DIAGNOSIS — F17200 Nicotine dependence, unspecified, uncomplicated: Secondary | ICD-10-CM | POA: Diagnosis not present

## 2014-12-15 DIAGNOSIS — I739 Peripheral vascular disease, unspecified: Secondary | ICD-10-CM | POA: Diagnosis not present

## 2014-12-16 DIAGNOSIS — I739 Peripheral vascular disease, unspecified: Secondary | ICD-10-CM | POA: Diagnosis not present

## 2014-12-16 DIAGNOSIS — Z961 Presence of intraocular lens: Secondary | ICD-10-CM | POA: Diagnosis not present

## 2014-12-16 DIAGNOSIS — H4011X3 Primary open-angle glaucoma, severe stage: Secondary | ICD-10-CM | POA: Diagnosis not present

## 2014-12-16 DIAGNOSIS — H4011X1 Primary open-angle glaucoma, mild stage: Secondary | ICD-10-CM | POA: Diagnosis not present

## 2014-12-16 DIAGNOSIS — H05241 Constant exophthalmos, right eye: Secondary | ICD-10-CM | POA: Diagnosis not present

## 2014-12-16 NOTE — Op Note (Signed)
[facility] [place], [address]   ENDOSCOPY PROCEDURE REPORT  PATIENT: Gina, [name]  MR#: [id number] BIRTHDATE: 1929/06/27 , 85  yrs. old GENDER: female ENDOSCOPIST:Alver Leete Benson [place], MD REFERRED BY: PROCEDURE DATE:  12/08/2014 PROCEDURE:   Enteroscopy with ablation ASA CLASS:    Class III INDICATIONS: Anemia and history of AVMs MEDICATION: Monitored anesthesia care TOPICAL ANESTHETIC:   none  DESCRIPTION OF PROCEDURE:   After the risks and benefits of the procedure were explained, informed consent was obtained.  The endoscope was introduced through the mouth  and advanced to the second portion of the duodenum .  The instrument was slowly withdrawn as the mucosa was fully examined. Estimated blood loss is zero unless otherwise noted in this procedure report.   FINDINGS: The esophagus and the gastric lumen were normal.  In the duodenum and proximal jejunum multiple typical and atypical AVMs were identified.  No lesions were bleeding, but all lesions were ablated with APC.  The area was evaluated overt three passes.  The majority of the AVMs were in the distal duodenum/proximal jejunum.. Retroflexed views revealed no abnormalities.    The scope was then withdrawn from the patient and the procedure completed.  COMPLICATIONS: There were no immediate complications.  ENDOSCOPIC IMPRESSION: 1) Distal duodenal/proximal jejunal AVMs s/p APC.  RECOMMENDATIONS: 1) Follow [facility]GB in the office.   _______________________________ eSigned:  Carol Ada, MD Jan 09, 2015 9:47 AM     cc:  CPT CODES: ICD CODES:  The ICD and CPT codes recommended by this software are interpretations from the data that the clinical staff has captured with the software.  The verification of the translation of this report to the ICD and CPT codes and modifiers is the sole responsibility of the health care institution and practicing physician where this report was  generated.  [employer] will not be held responsible for the validity of the ICD and CPT codes included on this report.  AMA assumes no liability for data contained or not contained herein. CPT is a Designer, television/film set of the [employer].  PATIENT NAME:  Gina, [name] MR#: [id number]

## 2014-12-18 DIAGNOSIS — I739 Peripheral vascular disease, unspecified: Secondary | ICD-10-CM | POA: Diagnosis not present

## 2014-12-19 DIAGNOSIS — D649 Anemia, unspecified: Secondary | ICD-10-CM | POA: Diagnosis not present

## 2014-12-19 DIAGNOSIS — E0500 Thyrotoxicosis with diffuse goiter without thyrotoxic crisis or storm: Secondary | ICD-10-CM | POA: Diagnosis not present

## 2014-12-19 DIAGNOSIS — F17200 Nicotine dependence, unspecified, uncomplicated: Secondary | ICD-10-CM | POA: Diagnosis not present

## 2014-12-19 DIAGNOSIS — R600 Localized edema: Secondary | ICD-10-CM | POA: Diagnosis not present

## 2014-12-19 DIAGNOSIS — I739 Peripheral vascular disease, unspecified: Secondary | ICD-10-CM | POA: Diagnosis not present

## 2014-12-19 DIAGNOSIS — E119 Type 2 diabetes mellitus without complications: Secondary | ICD-10-CM | POA: Diagnosis not present

## 2014-12-20 DIAGNOSIS — I739 Peripheral vascular disease, unspecified: Secondary | ICD-10-CM | POA: Diagnosis not present

## 2014-12-21 DIAGNOSIS — E0500 Thyrotoxicosis with diffuse goiter without thyrotoxic crisis or storm: Secondary | ICD-10-CM | POA: Diagnosis not present

## 2014-12-21 DIAGNOSIS — E119 Type 2 diabetes mellitus without complications: Secondary | ICD-10-CM | POA: Diagnosis not present

## 2014-12-21 DIAGNOSIS — D649 Anemia, unspecified: Secondary | ICD-10-CM | POA: Diagnosis not present

## 2014-12-21 DIAGNOSIS — R600 Localized edema: Secondary | ICD-10-CM | POA: Diagnosis not present

## 2014-12-21 DIAGNOSIS — F17200 Nicotine dependence, unspecified, uncomplicated: Secondary | ICD-10-CM | POA: Diagnosis not present

## 2014-12-21 DIAGNOSIS — I739 Peripheral vascular disease, unspecified: Secondary | ICD-10-CM | POA: Diagnosis not present

## 2014-12-22 ENCOUNTER — Other Ambulatory Visit: Payer: Self-pay | Admitting: Internal Medicine

## 2014-12-22 DIAGNOSIS — I739 Peripheral vascular disease, unspecified: Secondary | ICD-10-CM | POA: Diagnosis not present

## 2014-12-23 DIAGNOSIS — I739 Peripheral vascular disease, unspecified: Secondary | ICD-10-CM | POA: Diagnosis not present

## 2014-12-26 DIAGNOSIS — I739 Peripheral vascular disease, unspecified: Secondary | ICD-10-CM | POA: Diagnosis not present

## 2014-12-26 DIAGNOSIS — K31811 Angiodysplasia of stomach and duodenum with bleeding: Secondary | ICD-10-CM | POA: Diagnosis not present

## 2014-12-27 DIAGNOSIS — I739 Peripheral vascular disease, unspecified: Secondary | ICD-10-CM | POA: Diagnosis not present

## 2014-12-28 DIAGNOSIS — I739 Peripheral vascular disease, unspecified: Secondary | ICD-10-CM | POA: Diagnosis not present

## 2014-12-29 DIAGNOSIS — I739 Peripheral vascular disease, unspecified: Secondary | ICD-10-CM | POA: Diagnosis not present

## 2014-12-30 DIAGNOSIS — I739 Peripheral vascular disease, unspecified: Secondary | ICD-10-CM | POA: Diagnosis not present

## 2015-01-02 DIAGNOSIS — I739 Peripheral vascular disease, unspecified: Secondary | ICD-10-CM | POA: Diagnosis not present

## 2015-01-03 DIAGNOSIS — I739 Peripheral vascular disease, unspecified: Secondary | ICD-10-CM | POA: Diagnosis not present

## 2015-01-05 DIAGNOSIS — I739 Peripheral vascular disease, unspecified: Secondary | ICD-10-CM | POA: Diagnosis not present

## 2015-01-06 DIAGNOSIS — I739 Peripheral vascular disease, unspecified: Secondary | ICD-10-CM | POA: Diagnosis not present

## 2015-01-07 DIAGNOSIS — I739 Peripheral vascular disease, unspecified: Secondary | ICD-10-CM | POA: Diagnosis not present

## 2015-01-08 DIAGNOSIS — I739 Peripheral vascular disease, unspecified: Secondary | ICD-10-CM | POA: Diagnosis not present

## 2015-01-09 DIAGNOSIS — I739 Peripheral vascular disease, unspecified: Secondary | ICD-10-CM | POA: Diagnosis not present

## 2015-01-10 DIAGNOSIS — I739 Peripheral vascular disease, unspecified: Secondary | ICD-10-CM | POA: Diagnosis not present

## 2015-01-11 DIAGNOSIS — I739 Peripheral vascular disease, unspecified: Secondary | ICD-10-CM | POA: Diagnosis not present

## 2015-01-12 DIAGNOSIS — I739 Peripheral vascular disease, unspecified: Secondary | ICD-10-CM | POA: Diagnosis not present

## 2015-01-13 DIAGNOSIS — I739 Peripheral vascular disease, unspecified: Secondary | ICD-10-CM | POA: Diagnosis not present

## 2015-01-14 DIAGNOSIS — I739 Peripheral vascular disease, unspecified: Secondary | ICD-10-CM | POA: Diagnosis not present

## 2015-01-15 DIAGNOSIS — I739 Peripheral vascular disease, unspecified: Secondary | ICD-10-CM | POA: Diagnosis not present

## 2015-01-16 DIAGNOSIS — I739 Peripheral vascular disease, unspecified: Secondary | ICD-10-CM | POA: Diagnosis not present

## 2015-01-17 DIAGNOSIS — I739 Peripheral vascular disease, unspecified: Secondary | ICD-10-CM | POA: Diagnosis not present

## 2015-01-18 DIAGNOSIS — I739 Peripheral vascular disease, unspecified: Secondary | ICD-10-CM | POA: Diagnosis not present

## 2015-01-19 DIAGNOSIS — I739 Peripheral vascular disease, unspecified: Secondary | ICD-10-CM | POA: Diagnosis not present

## 2015-01-20 DIAGNOSIS — I739 Peripheral vascular disease, unspecified: Secondary | ICD-10-CM | POA: Diagnosis not present

## 2015-01-21 DIAGNOSIS — I739 Peripheral vascular disease, unspecified: Secondary | ICD-10-CM | POA: Diagnosis not present

## 2015-01-22 DIAGNOSIS — I739 Peripheral vascular disease, unspecified: Secondary | ICD-10-CM | POA: Diagnosis not present

## 2015-01-23 DIAGNOSIS — I739 Peripheral vascular disease, unspecified: Secondary | ICD-10-CM | POA: Diagnosis not present

## 2015-01-24 DIAGNOSIS — I739 Peripheral vascular disease, unspecified: Secondary | ICD-10-CM | POA: Diagnosis not present

## 2015-01-25 DIAGNOSIS — I739 Peripheral vascular disease, unspecified: Secondary | ICD-10-CM | POA: Diagnosis not present

## 2015-01-26 DIAGNOSIS — I739 Peripheral vascular disease, unspecified: Secondary | ICD-10-CM | POA: Diagnosis not present

## 2015-01-27 DIAGNOSIS — I739 Peripheral vascular disease, unspecified: Secondary | ICD-10-CM | POA: Diagnosis not present

## 2015-01-28 DIAGNOSIS — I739 Peripheral vascular disease, unspecified: Secondary | ICD-10-CM | POA: Diagnosis not present

## 2015-01-29 DIAGNOSIS — I739 Peripheral vascular disease, unspecified: Secondary | ICD-10-CM | POA: Diagnosis not present

## 2015-01-30 DIAGNOSIS — I739 Peripheral vascular disease, unspecified: Secondary | ICD-10-CM | POA: Diagnosis not present

## 2015-01-31 DIAGNOSIS — I739 Peripheral vascular disease, unspecified: Secondary | ICD-10-CM | POA: Diagnosis not present

## 2015-02-02 DIAGNOSIS — I739 Peripheral vascular disease, unspecified: Secondary | ICD-10-CM | POA: Diagnosis not present

## 2015-02-03 DIAGNOSIS — I739 Peripheral vascular disease, unspecified: Secondary | ICD-10-CM | POA: Diagnosis not present

## 2015-02-04 DIAGNOSIS — I739 Peripheral vascular disease, unspecified: Secondary | ICD-10-CM | POA: Diagnosis not present

## 2015-02-05 DIAGNOSIS — I739 Peripheral vascular disease, unspecified: Secondary | ICD-10-CM | POA: Diagnosis not present

## 2015-02-06 DIAGNOSIS — I739 Peripheral vascular disease, unspecified: Secondary | ICD-10-CM | POA: Diagnosis not present

## 2015-02-07 DIAGNOSIS — I739 Peripheral vascular disease, unspecified: Secondary | ICD-10-CM | POA: Diagnosis not present

## 2015-02-08 DIAGNOSIS — I739 Peripheral vascular disease, unspecified: Secondary | ICD-10-CM | POA: Diagnosis not present

## 2015-02-09 DIAGNOSIS — I739 Peripheral vascular disease, unspecified: Secondary | ICD-10-CM | POA: Diagnosis not present

## 2015-02-10 DIAGNOSIS — I739 Peripheral vascular disease, unspecified: Secondary | ICD-10-CM | POA: Diagnosis not present

## 2015-02-11 DIAGNOSIS — I739 Peripheral vascular disease, unspecified: Secondary | ICD-10-CM | POA: Diagnosis not present

## 2015-02-12 DIAGNOSIS — I739 Peripheral vascular disease, unspecified: Secondary | ICD-10-CM | POA: Diagnosis not present

## 2015-02-13 DIAGNOSIS — I739 Peripheral vascular disease, unspecified: Secondary | ICD-10-CM | POA: Diagnosis not present

## 2015-02-14 DIAGNOSIS — I739 Peripheral vascular disease, unspecified: Secondary | ICD-10-CM | POA: Diagnosis not present

## 2015-02-15 DIAGNOSIS — I739 Peripheral vascular disease, unspecified: Secondary | ICD-10-CM | POA: Diagnosis not present

## 2015-02-16 DIAGNOSIS — I739 Peripheral vascular disease, unspecified: Secondary | ICD-10-CM | POA: Diagnosis not present

## 2015-02-17 ENCOUNTER — Other Ambulatory Visit: Payer: Self-pay | Admitting: Vascular Surgery

## 2015-02-17 ENCOUNTER — Encounter: Payer: Self-pay | Admitting: Family

## 2015-02-17 DIAGNOSIS — Z48812 Encounter for surgical aftercare following surgery on the circulatory system: Secondary | ICD-10-CM

## 2015-02-17 DIAGNOSIS — I739 Peripheral vascular disease, unspecified: Secondary | ICD-10-CM

## 2015-02-18 DIAGNOSIS — I739 Peripheral vascular disease, unspecified: Secondary | ICD-10-CM | POA: Diagnosis not present

## 2015-02-19 DIAGNOSIS — I739 Peripheral vascular disease, unspecified: Secondary | ICD-10-CM | POA: Diagnosis not present

## 2015-02-20 DIAGNOSIS — I739 Peripheral vascular disease, unspecified: Secondary | ICD-10-CM | POA: Diagnosis not present

## 2015-02-21 ENCOUNTER — Ambulatory Visit (HOSPITAL_COMMUNITY)
Admission: RE | Admit: 2015-02-21 | Discharge: 2015-02-21 | Disposition: A | Discharge status: Home / Self Care | Payer: Medicare Other | Source: Ambulatory Visit | Attending: Vascular Surgery | Admitting: Vascular Surgery

## 2015-02-21 ENCOUNTER — Other Ambulatory Visit: Payer: Self-pay

## 2015-02-21 ENCOUNTER — Encounter: Payer: Self-pay | Admitting: Family

## 2015-02-21 ENCOUNTER — Ambulatory Visit (INDEPENDENT_AMBULATORY_CARE_PROVIDER_SITE_OTHER)
Admission: RE | Admit: 2015-02-21 | Discharge: 2015-02-21 | Disposition: A | Discharge status: Home / Self Care | Payer: Medicare Other | Source: Ambulatory Visit | Attending: Vascular Surgery | Admitting: Vascular Surgery

## 2015-02-21 ENCOUNTER — Ambulatory Visit (INDEPENDENT_AMBULATORY_CARE_PROVIDER_SITE_OTHER): Payer: Medicare Other | Admitting: Family

## 2015-02-21 VITALS — BP 130/62 | HR 55 | Resp 14 | Ht 67.0 in | Wt 109.0 lb

## 2015-02-21 DIAGNOSIS — T82858A Stenosis of vascular prosthetic devices, implants and grafts, initial encounter: Secondary | ICD-10-CM

## 2015-02-21 DIAGNOSIS — I739 Peripheral vascular disease, unspecified: Secondary | ICD-10-CM | POA: Diagnosis not present

## 2015-02-21 DIAGNOSIS — Z9889 Other specified postprocedural states: Secondary | ICD-10-CM

## 2015-02-21 DIAGNOSIS — I6522 Occlusion and stenosis of left carotid artery: Secondary | ICD-10-CM | POA: Diagnosis not present

## 2015-02-21 DIAGNOSIS — I10 Essential (primary) hypertension: Secondary | ICD-10-CM | POA: Insufficient documentation

## 2015-02-21 DIAGNOSIS — I779 Disorder of arteries and arterioles, unspecified: Secondary | ICD-10-CM | POA: Diagnosis not present

## 2015-02-21 DIAGNOSIS — E785 Hyperlipidemia, unspecified: Secondary | ICD-10-CM | POA: Diagnosis not present

## 2015-02-21 DIAGNOSIS — Z48812 Encounter for surgical aftercare following surgery on the circulatory system: Secondary | ICD-10-CM | POA: Diagnosis not present

## 2015-02-21 DIAGNOSIS — F17200 Nicotine dependence, unspecified, uncomplicated: Secondary | ICD-10-CM | POA: Diagnosis not present

## 2015-02-21 DIAGNOSIS — Z95828 Presence of other vascular implants and grafts: Secondary | ICD-10-CM

## 2015-02-21 DIAGNOSIS — Z720 Tobacco use: Secondary | ICD-10-CM | POA: Diagnosis not present

## 2015-02-21 NOTE — Patient Instructions (Signed)
Peripheral Vascular Disease Peripheral Vascular Disease (PVD), also called Peripheral Arterial Disease (PAD), is a circulation problem caused by cholesterol (atherosclerotic plaque) deposits in the arteries. PVD commonly occurs in the lower extremities (legs) but it can occur in other areas of the body, such as your arms. The cholesterol buildup in the arteries reduces blood flow which can cause pain and other serious problems. The presence of PVD can place a person at risk for Coronary Artery Disease (CAD).  CAUSES  Causes of PVD can be many. It is usually associated with more than one risk factor such as:   High Cholesterol.  Smoking.  Diabetes.  Lack of exercise or inactivity.  High blood pressure (hypertension).  Obesity.  Family history. SYMPTOMS   When the lower extremities are affected, patients with PVD may experience:  Leg pain with exertion or physical activity. This is called INTERMITTENT CLAUDICATION. This may present as cramping or numbness with physical activity. The location of the pain is associated with the level of blockage. For example, blockage at the abdominal level (distal abdominal aorta) may result in buttock or hip pain. Lower leg arterial blockage may result in calf pain.  As PVD becomes more severe, pain can develop with less physical activity.  In people with severe PVD, leg pain may occur at rest.  Other PVD signs and symptoms:  Leg numbness or weakness.  Coldness in the affected leg or foot, especially when compared to the other leg.  A change in leg color.  Patients with significant PVD are more prone to ulcers or sores on toes, feet or legs. These may take longer to heal or may reoccur. The ulcers or sores can become infected.  If signs and symptoms of PVD are ignored, gangrene may occur. This can result in the loss of toes or loss of an entire limb.  Not all leg pain is related to PVD. Other medical conditions can cause leg pain such  as:  Blood clots (embolism) or Deep Vein Thrombosis.  Inflammation of the blood vessels (vasculitis).  Spinal stenosis. DIAGNOSIS  Diagnosis of PVD can involve several different types of tests. These can include:  Pulse Volume Recording Method (PVR). This test is simple, painless and does not involve the use of X-rays. PVR involves measuring and comparing the blood pressure in the arms and legs. An ABI (Ankle-Brachial Index) is calculated. The normal ratio of blood pressures is 1. As this number becomes smaller, it indicates more severe disease.  < 0.95 - indicates significant narrowing in one or more leg vessels.  <0.8 - there will usually be pain in the foot, leg or buttock with exercise.  <0.4 - will usually have pain in the legs at rest.  <0.25 - usually indicates limb threatening PVD.  Doppler detection of pulses in the legs. This test is painless and checks to see if you have a pulses in your legs/feet.  A dye or contrast material (a substance that highlights the blood vessels so they show up on x-ray) may be given to help your caregiver better see the arteries for the following tests. The dye is eliminated from your body by the kidney's. Your caregiver may order blood work to check your kidney function and other laboratory values before the following tests are performed:  Magnetic Resonance Angiography (MRA). An MRA is a picture study of the blood vessels and arteries. The MRA machine uses a large magnet to produce images of the blood vessels.  Computed Tomography Angiography (CTA). A CTA   is a specialized x-ray that looks at how the blood flows in your blood vessels. An IV may be inserted into your arm so contrast dye can be injected.  Angiogram. Is a procedure that uses x-rays to look at your blood vessels. This procedure is minimally invasive, meaning a small incision (cut) is made in your groin. A small tube (catheter) is then inserted into the artery of your groin. The catheter  is guided to the blood vessel or artery your caregiver wants to examine. Contrast dye is injected into the catheter. X-rays are then taken of the blood vessel or artery. After the images are obtained, the catheter is taken out. TREATMENT  Treatment of PVD involves many interventions which may include:  Lifestyle changes:  Quitting smoking.  Exercise.  Following a low fat, low cholesterol diet.  Control of diabetes.  Foot care is very important to the PVD patient. Good foot care can help prevent infection.  Medication:  Cholesterol-lowering medicine.  Blood pressure medicine.  Anti-platelet drugs.  Certain medicines may reduce symptoms of Intermittent Claudication.  Interventional/Surgical options:  Angioplasty. An Angioplasty is a procedure that inflates a balloon in the blocked artery. This opens the blocked artery to improve blood flow.  Stent Implant. A wire mesh tube (stent) is placed in the artery. The stent expands and stays in place, allowing the artery to remain open.  Peripheral Bypass Surgery. This is a surgical procedure that reroutes the blood around a blocked artery to help improve blood flow. This type of procedure may be performed if Angioplasty or stent implants are not an option. SEEK IMMEDIATE MEDICAL CARE IF:   You develop pain or numbness in your arms or legs.  Your arm or leg turns cold, becomes blue in color.  You develop redness, warmth, swelling and pain in your arms or legs. MAKE SURE YOU:   Understand these instructions.  Will watch your condition.  Will get help right away if you are not doing well or get worse. Document Released: 10/24/2004 Document Revised: 12/09/2011 Document Reviewed: 09/20/2008 [employer] Patient Information 2015 [employer]. This information is not intended to replace advice given to you by your health care provider. Make sure you discuss any questions you have with your health care provider.    Smoking  Cessation Quitting smoking is important to your health and has many advantages. However, it is not always easy to quit since nicotine is a very addictive drug. Oftentimes, people try 3 times or more before being able to quit. This document explains the best ways for you to prepare to quit smoking. Quitting takes hard work and a lot of effort, but you can do it. ADVANTAGES OF QUITTING SMOKING  You will live longer, feel better, and live better.  Your body will feel the impact of quitting smoking almost immediately.  Within 20 minutes, blood pressure decreases. Your pulse returns to its normal level.  After 8 hours, carbon monoxide levels in the blood return to normal. Your oxygen level increases.  After 24 hours, the chance of having a heart attack starts to decrease. Your breath, hair, and body stop smelling like smoke.  After 48 hours, damaged nerve endings begin to recover. Your sense of taste and smell improve.  After 72 hours, the body is virtually free of nicotine. Your bronchial tubes relax and breathing becomes easier.  After 2 to 12 weeks, lungs can hold more air. Exercise becomes easier and circulation improves.  The risk of having a heart attack, stroke,   cancer, or lung disease is greatly reduced.  After 1 year, the risk of coronary heart disease is cut in half.  After 5 years, the risk of stroke falls to the same as a nonsmoker.  After 10 years, the risk of lung cancer is cut in half and the risk of other cancers decreases significantly.  After 15 years, the risk of coronary heart disease drops, usually to the level of a nonsmoker.  If you are pregnant, quitting smoking will improve your chances of having a healthy baby.  The people you live with, especially any children, will be healthier.  You will have extra money to spend on things other than cigarettes. QUESTIONS TO THINK ABOUT BEFORE ATTEMPTING TO QUIT You may want to talk about your answers with your health care  provider.  Why do you want to quit?  If you tried to quit in the past, what helped and what did not?  What will be the most difficult situations for you after you quit? How will you plan to handle them?  Who can help you through the tough times? Your family? Friends? A health care provider?  What pleasures do you get from smoking? What ways can you still get pleasure if you quit? Here are some questions to ask your health care provider:  How can you help me to be successful at quitting?  What medicine do you think would be best for me and how should I take it?  What should I do if I need more help?  What is smoking withdrawal like? How can I get information on withdrawal? GET READY  Set a quit date.  Change your environment by getting rid of all cigarettes, ashtrays, matches, and lighters in your home, car, or work. Do not let people smoke in your home.  Review your past attempts to quit. Think about what worked and what did not. GET SUPPORT AND ENCOURAGEMENT You have a better chance of being successful if you have help. You can get support in many ways.  Tell your family, friends, and coworkers that you are going to quit and need their support. Ask them not to smoke around you.  Get individual, group, or telephone counseling and support. Programs are available at local hospitals and health centers. Call your local health department for information about programs in your area.  Spiritual beliefs and practices may help some smokers quit.  Download a "quit meter" on your computer to keep track of quit statistics, such as how long you have gone without smoking, cigarettes not smoked, and money saved.  Get a self-help book about quitting smoking and staying off tobacco. LEARN NEW SKILLS AND BEHAVIORS  Distract yourself from urges to smoke. Talk to someone, go for a walk, or occupy your time with a task.  Change your normal routine. Take a different route to work. Drink tea  instead of coffee. Eat breakfast in a different place.  Reduce your stress. Take a hot bath, exercise, or read a book.  Plan something enjoyable to do every day. Reward yourself for not smoking.  Explore interactive web-based programs that specialize in helping you quit. GET MEDICINE AND USE IT CORRECTLY Medicines can help you stop smoking and decrease the urge to smoke. Combining medicine with the above behavioral methods and support can greatly increase your chances of successfully quitting smoking.  Nicotine replacement therapy helps deliver nicotine to your body without the negative effects and risks of smoking. Nicotine replacement therapy includes nicotine gum, lozenges,   inhalers, nasal sprays, and skin patches. Some may be available over-the-counter and others require a prescription.  Antidepressant medicine helps people abstain from smoking, but how this works is unknown. This medicine is available by prescription.  Nicotinic receptor partial agonist medicine simulates the effect of nicotine in your brain. This medicine is available by prescription. Ask your health care provider for advice about which medicines to use and how to use them based on your health history. Your health care provider will tell you what side effects to look out for if you choose to be on a medicine or therapy. Carefully read the information on the package. Do not use any other product containing nicotine while using a nicotine replacement product.  RELAPSE OR DIFFICULT SITUATIONS Most relapses occur within the first 3 months after quitting. Do not be discouraged if you start smoking again. Remember, most people try several times before finally quitting. You may have symptoms of withdrawal because your body is used to nicotine. You may crave cigarettes, be irritable, feel very hungry, cough often, get headaches, or have difficulty concentrating. The withdrawal symptoms are only temporary. They are strongest when you  first quit, but they will go away within 10-14 days. To reduce the chances of relapse, try to:  Avoid drinking alcohol. Drinking lowers your chances of successfully quitting.  Reduce the amount of caffeine you consume. Once you quit smoking, the amount of caffeine in your body increases and can give you symptoms, such as a rapid heartbeat, sweating, and anxiety.  Avoid smokers because they can make you want to smoke.  Do not let weight gain distract you. Many smokers will gain weight when they quit, usually less than 10 pounds. Eat a healthy diet and stay active. You can always lose the weight gained after you quit.  Find ways to improve your mood other than smoking. FOR MORE INFORMATION  www.smokefree.gov  Document Released: 09/10/2001 Document Revised: 01/31/2014 Document Reviewed: 12/26/2011 [employer] Patient Information 2015 [employer]. This information is not intended to replace advice given to you by your health care provider. Make sure you discuss any questions you have with your health care provider.    Smoking Cessation, Tips for Success If you are ready to quit smoking, congratulations! You have chosen to help yourself be healthier. Cigarettes bring nicotine, tar, carbon monoxide, and other irritants into your body. Your lungs, heart, and blood vessels will be able to work better without these poisons. There are many different ways to quit smoking. Nicotine gum, nicotine patches, a nicotine inhaler, or nicotine nasal spray can help with physical craving. Hypnosis, support groups, and medicines help break the habit of smoking. WHAT THINGS CAN I DO TO MAKE QUITTING EASIER?  Here are some tips to help you quit for good:  Pick a date when you will quit smoking completely. Tell all of your friends and family about your plan to quit on that date.  Do not try to slowly cut down on the number of cigarettes you are smoking. Pick a quit date and quit smoking completely starting on that  day.  Throw away all cigarettes.   Clean and remove all ashtrays from your home, work, and car.  On a card, write down your reasons for quitting. Carry the card with you and read it when you get the urge to smoke.  Cleanse your body of nicotine. Drink enough water and fluids to keep your urine clear or pale yellow. Do this after quitting to flush the nicotine from   your body.  Learn to predict your moods. Do not let a bad situation be your excuse to have a cigarette. Some situations in your life might tempt you into wanting a cigarette.  Never have "just one" cigarette. It leads to wanting another and another. Remind yourself of your decision to quit.  Change habits associated with smoking. If you smoked while driving or when feeling stressed, try other activities to replace smoking. Stand up when drinking your coffee. Brush your teeth after eating. Sit in a different chair when you read the paper. Avoid alcohol while trying to quit, and try to drink fewer caffeinated beverages. Alcohol and caffeine may urge you to smoke.  Avoid foods and drinks that can trigger a desire to smoke, such as sugary or spicy foods and alcohol.  Ask people who smoke not to smoke around you.  Have something planned to do right after eating or having a cup of coffee. For example, plan to take a walk or exercise.  Try a relaxation exercise to calm you down and decrease your stress. Remember, you may be tense and nervous for the first 2 weeks after you quit, but this will pass.  Find new activities to keep your hands busy. Play with a pen, coin, or rubber band. Doodle or draw things on paper.  Brush your teeth right after eating. This will help cut down on the craving for the taste of tobacco after meals. You can also try mouthwash.   Use oral substitutes in place of cigarettes. Try using lemon drops, carrots, cinnamon sticks, or chewing gum. Keep them handy so they are available when you have the urge to  smoke.  When you have the urge to smoke, try deep breathing.  Designate your home as a nonsmoking area.  If you are a heavy smoker, ask your health care provider about a prescription for nicotine chewing gum. It can ease your withdrawal from nicotine.  Reward yourself. Set aside the cigarette money you save and buy yourself something nice.  Look for support from others. Join a support group or smoking cessation program. Ask someone at home or at work to help you with your plan to quit smoking.  Always ask yourself, "Do I need this cigarette or is this just a reflex?" Tell yourself, "Today, I choose not to smoke," or "I do not want to smoke." You are reminding yourself of your decision to quit.  Do not replace cigarette smoking with electronic cigarettes (commonly called e-cigarettes). The safety of e-cigarettes is unknown, and some may contain harmful chemicals.  If you relapse, do not give up! Plan ahead and think about what you will do the next time you get the urge to smoke. HOW WILL I FEEL WHEN I QUIT SMOKING? You may have symptoms of withdrawal because your body is used to nicotine (the addictive substance in cigarettes). You may crave cigarettes, be irritable, feel very hungry, cough often, get headaches, or have difficulty concentrating. The withdrawal symptoms are only temporary. They are strongest when you first quit but will go away within 10-14 days. When withdrawal symptoms occur, stay in control. Think about your reasons for quitting. Remind yourself that these are signs that your body is healing and getting used to being without cigarettes. Remember that withdrawal symptoms are easier to treat than the major diseases that smoking can cause.  Even after the withdrawal is over, expect periodic urges to smoke. However, these cravings are generally short lived and will go away whether you   smoke or not. Do not smoke! WHAT RESOURCES ARE AVAILABLE TO HELP ME QUIT SMOKING? Your health care  provider can direct you to community resources or hospitals for support, which may include:  Group support.  Education.  Hypnosis.  Therapy. Document Released: 06/14/2004 Document Revised: 01/31/2014 Document Reviewed: 03/04/2013 [employer] Patient Information 2015 [employer]. This information is not intended to replace advice given to you by your health care provider. Make sure you discuss any questions you have with your health care provider.  
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
## 2015-02-22 DIAGNOSIS — I739 Peripheral vascular disease, unspecified: Secondary | ICD-10-CM | POA: Diagnosis not present

## 2015-02-23 DIAGNOSIS — I739 Peripheral vascular disease, unspecified: Secondary | ICD-10-CM | POA: Diagnosis not present

## 2015-02-24 DIAGNOSIS — I739 Peripheral vascular disease, unspecified: Secondary | ICD-10-CM | POA: Diagnosis not present

## 2015-02-25 DIAGNOSIS — I739 Peripheral vascular disease, unspecified: Secondary | ICD-10-CM | POA: Diagnosis not present

## 2015-02-26 DIAGNOSIS — I739 Peripheral vascular disease, unspecified: Secondary | ICD-10-CM | POA: Diagnosis not present

## 2015-02-28 DIAGNOSIS — I739 Peripheral vascular disease, unspecified: Secondary | ICD-10-CM | POA: Diagnosis not present

## 2015-03-01 DIAGNOSIS — I739 Peripheral vascular disease, unspecified: Secondary | ICD-10-CM | POA: Diagnosis not present

## 2015-03-02 DIAGNOSIS — I739 Peripheral vascular disease, unspecified: Secondary | ICD-10-CM | POA: Diagnosis not present

## 2015-03-03 ENCOUNTER — Other Ambulatory Visit: Payer: Self-pay | Admitting: Internal Medicine

## 2015-03-03 DIAGNOSIS — I739 Peripheral vascular disease, unspecified: Secondary | ICD-10-CM | POA: Diagnosis not present

## 2015-03-04 DIAGNOSIS — I739 Peripheral vascular disease, unspecified: Secondary | ICD-10-CM | POA: Diagnosis not present

## 2015-03-05 DIAGNOSIS — I739 Peripheral vascular disease, unspecified: Secondary | ICD-10-CM | POA: Diagnosis not present

## 2015-03-06 DIAGNOSIS — I739 Peripheral vascular disease, unspecified: Secondary | ICD-10-CM | POA: Diagnosis not present

## 2015-03-07 ENCOUNTER — Encounter (HOSPITAL_COMMUNITY): Payer: Self-pay | Admitting: Surgery

## 2015-03-07 ENCOUNTER — Telehealth: Payer: Self-pay | Admitting: Surgery

## 2015-03-07 ENCOUNTER — Encounter (HOSPITAL_COMMUNITY)
Admission: AD | Disposition: A | Discharge status: Home / Self Care | Payer: Self-pay | Source: Ambulatory Visit | Attending: Surgery

## 2015-03-07 ENCOUNTER — Ambulatory Visit (HOSPITAL_COMMUNITY)
Admission: AD | Admit: 2015-03-07 | Discharge: 2015-03-07 | Disposition: A | Discharge status: Home / Self Care | Payer: Medicare Other | Source: Ambulatory Visit | Attending: Surgery | Admitting: Surgery

## 2015-03-07 ENCOUNTER — Other Ambulatory Visit: Payer: Self-pay | Admitting: *Deleted

## 2015-03-07 DIAGNOSIS — Z87891 Personal history of nicotine dependence: Secondary | ICD-10-CM | POA: Diagnosis not present

## 2015-03-07 DIAGNOSIS — E785 Hyperlipidemia, unspecified: Secondary | ICD-10-CM | POA: Insufficient documentation

## 2015-03-07 DIAGNOSIS — I70312 Atherosclerosis of unspecified type of bypass graft(s) of the extremities with intermittent claudication, left leg: Secondary | ICD-10-CM | POA: Insufficient documentation

## 2015-03-07 DIAGNOSIS — H409 Unspecified glaucoma: Secondary | ICD-10-CM | POA: Diagnosis not present

## 2015-03-07 DIAGNOSIS — Z48812 Encounter for surgical aftercare following surgery on the circulatory system: Secondary | ICD-10-CM

## 2015-03-07 DIAGNOSIS — I70302 Unspecified atherosclerosis of unspecified type of bypass graft(s) of the extremities, left leg: Secondary | ICD-10-CM | POA: Diagnosis not present

## 2015-03-07 DIAGNOSIS — I5032 Chronic diastolic (congestive) heart failure: Secondary | ICD-10-CM | POA: Diagnosis not present

## 2015-03-07 DIAGNOSIS — I10 Essential (primary) hypertension: Secondary | ICD-10-CM | POA: Diagnosis not present

## 2015-03-07 DIAGNOSIS — I739 Peripheral vascular disease, unspecified: Secondary | ICD-10-CM | POA: Diagnosis not present

## 2015-03-07 HISTORY — PX: PERIPHERAL VASCULAR CATHETERIZATION: SHX172C

## 2015-03-07 LAB — POCT ACTIVATED CLOTTING TIME
Activated Clotting Time: 165 seconds
Activated Clotting Time: 190 seconds

## 2015-03-07 LAB — GLUCOSE, CAPILLARY: Glucose-Capillary: 110 mg/dL — ABNORMAL HIGH (ref 65–99)

## 2015-03-07 LAB — POCT I-STAT, CHEM 8
BUN: 17 mg/dL (ref 6–20)
Calcium, Ion: 1.2 mmol/L (ref 1.13–1.30)
Chloride: 103 mmol/L (ref 101–111)
Creatinine, Ser: 0.9 mg/dL (ref 0.44–1.00)
Glucose, Bld: 84 mg/dL (ref 65–99)
HCT: 27 % — ABNORMAL LOW (ref 36.0–46.0)
Hemoglobin: 9.2 g/dL — ABNORMAL LOW (ref 12.0–15.0)
Potassium: 3.5 mmol/L (ref 3.5–5.1)
Sodium: 139 mmol/L (ref 135–145)
TCO2: 23 mmol/L (ref 0–100)

## 2015-03-07 SURGERY — ABDOMINAL AORTOGRAM
Anesthesia: LOCAL

## 2015-03-07 MED ORDER — ACETAMINOPHEN 325 MG RE SUPP: 325.0000 mg | RECTAL | Status: DC | PRN

## 2015-03-07 MED ORDER — MIDAZOLAM HCL 2 MG/2ML IJ SOLN
INTRAMUSCULAR | Status: DC | PRN
  Administered 2015-03-07: 1 mg via INTRAVENOUS

## 2015-03-07 MED ORDER — GUAIFENESIN-DM 100-10 MG/5ML PO SYRP: 15.0000 mL | ORAL_SOLUTION | ORAL | Status: DC | PRN

## 2015-03-07 MED ORDER — METOPROLOL TARTRATE 1 MG/ML IV SOLN: 2.0000 mg | INTRAVENOUS | Status: DC | PRN

## 2015-03-07 MED ORDER — FENTANYL CITRATE (PF) 100 MCG/2ML IJ SOLN
INTRAMUSCULAR | Status: DC | PRN
  Administered 2015-03-07: 25 ug via INTRAVENOUS

## 2015-03-07 MED ORDER — ACETAMINOPHEN 325 MG PO TABS: 325.0000 mg | ORAL_TABLET | ORAL | Status: DC | PRN

## 2015-03-07 MED ORDER — MORPHINE SULFATE 10 MG/ML IJ SOLN: 2.0000 mg | INTRAMUSCULAR | Status: DC | PRN

## 2015-03-07 MED ORDER — PHENOL 1.4 % MT LIQD: 1.0000 | OROMUCOSAL | Status: DC | PRN

## 2015-03-07 MED ORDER — LIDOCAINE HCL (PF) 1 % IJ SOLN
INTRAMUSCULAR | Status: AC
  Filled 2015-03-07: qty 30

## 2015-03-07 MED ORDER — SODIUM CHLORIDE 0.9 % IV SOLN: 1.0000 mL/kg/h | INTRAVENOUS | Status: DC

## 2015-03-07 MED ORDER — ONDANSETRON HCL 4 MG/2ML IJ SOLN: 4.0000 mg | Freq: Four times a day (QID) | INTRAMUSCULAR | Status: DC | PRN

## 2015-03-07 MED ORDER — HEPARIN (PORCINE) IN NACL 2-0.9 UNIT/ML-% IJ SOLN
INTRAMUSCULAR | Status: AC
  Filled 2015-03-07: qty 1000

## 2015-03-07 MED ORDER — ALUM & MAG HYDROXIDE-SIMETH 200-200-20 MG/5ML PO SUSP: 15.0000 mL | ORAL | Status: DC | PRN

## 2015-03-07 MED ORDER — HEPARIN SODIUM (PORCINE) 1000 UNIT/ML IJ SOLN
INTRAMUSCULAR | Status: AC
  Filled 2015-03-07: qty 1

## 2015-03-07 MED ORDER — SODIUM CHLORIDE 0.9 % IV SOLN
INTRAVENOUS | Status: DC
  Administered 2015-03-07: 07:00:00 via INTRAVENOUS

## 2015-03-07 MED ORDER — MIDAZOLAM HCL 2 MG/2ML IJ SOLN
INTRAMUSCULAR | Status: AC
  Filled 2015-03-07: qty 2

## 2015-03-07 MED ORDER — OXYCODONE HCL 5 MG PO TABS: 5.0000 mg | ORAL_TABLET | ORAL | Status: DC | PRN

## 2015-03-07 MED ORDER — DOCUSATE SODIUM 100 MG PO CAPS: 100.0000 mg | ORAL_CAPSULE | Freq: Every day | ORAL | Status: DC

## 2015-03-07 MED ORDER — FENTANYL CITRATE (PF) 100 MCG/2ML IJ SOLN
INTRAMUSCULAR | Status: AC
  Filled 2015-03-07: qty 2

## 2015-03-07 MED ORDER — LABETALOL HCL 5 MG/ML IV SOLN
10.0000 mg | INTRAVENOUS | Status: DC | PRN
  Administered 2015-03-07: 10 mg via INTRAVENOUS

## 2015-03-07 MED ORDER — HYDRALAZINE HCL 20 MG/ML IJ SOLN
5.0000 mg | INTRAMUSCULAR | Status: AC | PRN
  Administered 2015-03-07 (×2): 5 mg via INTRAVENOUS

## 2015-03-07 MED ORDER — LABETALOL HCL 5 MG/ML IV SOLN
INTRAVENOUS | Status: AC
  Filled 2015-03-07: qty 4

## 2015-03-07 MED ORDER — HEPARIN SODIUM (PORCINE) 1000 UNIT/ML IJ SOLN
INTRAMUSCULAR | Status: DC | PRN
  Administered 2015-03-07: 5000 [IU] via INTRAVENOUS

## 2015-03-07 MED ORDER — HYDRALAZINE HCL 20 MG/ML IJ SOLN
INTRAMUSCULAR | Status: AC
  Filled 2015-03-07: qty 1

## 2015-03-07 SURGICAL SUPPLY — 16 items
BALLN ANGIOSCULPT 6X100 (BALLOONS) ×1 IMPLANT
CATH OMNI FLUSH 5F 65CM (CATHETERS) ×1 IMPLANT
COVER PRB 48X5XTLSCP FOLD TPE (BAG) IMPLANT
COVER PROBE 5X48 (BAG) ×2
DRAPE ZERO GRAVITY STERILE (DRAPES) ×1 IMPLANT
KIT ENCORE 26 ADVANTAGE (KITS) ×1 IMPLANT
KIT MICROINTRODUCER 5F 7206 (SHEATH) ×1 IMPLANT
KIT PV (KITS) ×2 IMPLANT
SHEATH PINNACLE 5F 10CM (SHEATH) ×1 IMPLANT
SHEATH PINNACLE MP 6F 45CM (SHEATH) ×1 IMPLANT
SHIELD RADPAD SCOOP 12X17 (MISCELLANEOUS) ×1 IMPLANT
SYR MEDRAD MARK V 150ML (SYRINGE) IMPLANT
TRANSDUCER W/STOPCOCK (MISCELLANEOUS) ×2 IMPLANT
TRAY PV CATH (CUSTOM PROCEDURE TRAY) ×2 IMPLANT
WIRE BENTSON .035X145CM (WIRE) ×1 IMPLANT
WIRE SPARTACORE .014X300CM (WIRE) ×1 IMPLANT

## 2015-03-07 NOTE — Op Note (Signed)
Patient name: Gina [name] MRN: [id number] DOB: 02/05/1929 Sex: female  03/07/2015 Pre-operative Diagnosis: Bypass graft stenosis, left leg Post-operative diagnosis:  Same Surgeon:  Annamarie Major Procedure Performed:  1.  Ultrasound-guided access, right femoral artery  2.  Abdominal aortogram  3.  Bilateral lower extremity runoff  4.  Angioplasty, left femoral-popliteal bypass graft   Findings:  2 areas of intragraft narrowing, likely secondary to calcification within the left bypass graft.  The more distal lesion created a 90% stenosis.  This was successfully treated using a 6 x 100 scoring balloon with residual stenosis less than 10%  Indications:  The patient came in for routine surveillance of her bilateral femoral popliteal bypass graft with Gore-Tex.  She is found to have elevated velocities on the left.  She is here for further evaluation and possible intervention  Procedure:  The patient was identified in the holding area and taken to room 8.  The patient was then placed supine on the table and prepped and draped in the usual sterile fashion.  A time out was called.  Ultrasound was used to evaluate the right common femoral artery.  It was patent .  A digital ultrasound image was acquired.  A micropuncture needle was used to access the right common femoral artery under ultrasound guidance.  An 018 wire was advanced without resistance and a micropuncture sheath was placed.  The 018 wire was removed and a benson wire was placed.  The micropuncture sheath was exchanged for a 5 french sheath.  An omniflush catheter was advanced over the wire to the level of L-1.  An abdominal angiogram was obtained.  The catheter was then pulled down to the aortic bifurcation and pelvic angiography was performed in separate oblique imaging Next, using the omniflush catheter and a benson wire, the aortic bifurcation was crossed and the catheter was placed into theleft external iliac artery and left runoff  was obtained.  right runoff was performed via retrograde sheath injections.  Findings:   Aortogram:  Heavily calcified infrarenal abdominal aorta without significant stenosis.  Bilateral renal ostium are calcified but no significant stenosis is identified.  No significant stenosis within the iliac system bilaterally  Right Lower Extremity:  The right common femoral and profunda femoral artery are patent.  There is a bypass graft from the right common femoral to the popliteal artery with single-vessel runoff.  No significant stenosis is identified.  Left Lower Extremity:  Left common femoral profunda femoral artery are widely patent.  There is a bypass graft from the common femoral to the above-knee popliteal artery with single-vessel runoff.  There are 2 areas of calcification within the bypass graft.  The most distal one is associated with a 90% stenosis  Intervention:  After the above images were acquired, the decision was made to proceed with intervention.  Over a Benson wire, a 6 French 45 cm sheath was inserted into the left external iliac artery.  The patient was fully heparinized.  The bypass graft was selected and a 014 [place] core was advanced through the bypass graft.  I next selected a Angiosculpt 6 x 100 scoring balloon and perform balloon angioplasty of the area of stenosis within the graft.  The balloon was taken to burst pressure and held up for 90 seconds.  Completion imaging revealed stenosis of less than 10%.  Runoff was unchanged.  Catheters and wires were removed.  The patient will be taken the holding area for a sheath pull once her coagulation  profile corrects.  Impression:  #1  no significant aortoiliac stenosis.  #2  right femoral-popliteal bypass graft is widely patent  #3  2 areas of intragraft narrowing, likely secondary to calcification within the left bypass graft.  The more distal lesion created a 90% stenosis.  This was successfully treated using a 6 x 100 scoring balloon  with residual stenosis less than 10%  #4  single-vessel runoff bilaterally     V. Annamarie Major, M.D. [facility] [place] Office: [phone] Pager:  [phone]
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
## 2015-03-07 NOTE — Research (Signed)
Safe Informed Consent   Subject Name: Gina [name]  Subject met inclusion and exclusion criteria.  The informed consent form, study requirements and expectations were reviewed with the subject and questions and concerns were addressed prior to the signing of the consent form.  The subject verbalized understanding of the trail requirements.  The subject agreed to participate in the Safe trial and signed the informed consent.  The informed consent was obtained prior to performance of any protocol-specific procedures for the subject.  A copy of the signed informed consent was given to the subject and a copy was placed in the subject's medical record.  Sandie Ano 03/07/2015, 7:15

## 2015-03-07 NOTE — Progress Notes (Signed)
Site area: rt groin fa sheath Site Prior to Removal:  Level  0 Pressure Applied For:  25 minutes Manual:   yes Patient Status During Pull:  Stable  Post Pull Site:  Level  0 Post Pull Instructions Given:  yes Post Pull Pulses Present: yes Dressing Applied:  tegaderm Bedrest begins @  1000 Comments:  none

## 2015-03-07 NOTE — Telephone Encounter (Signed)
Memory on VM is full, mailed letter, dpm

## 2015-03-07 NOTE — Discharge Instructions (Signed)

## 2015-03-07 NOTE — Telephone Encounter (Signed)
-----   Message from Mena Goes, RN sent at 03/07/2015  9:19 AM EDT ----- Regarding: Schedule   ----- Message -----    From: Serafina Mitchell, MD    Sent: 03/07/2015   8:41 AM      To: [facility] Charge Pool  03/07/2015:  Surgeon:  Annamarie Major Procedure Performed:  1.  Ultrasound-guided access, right femoral artery  2.  Abdominal aortogram  3.  Bilateral lower extremity runoff  4.  Angioplasty, left femoral-popliteal bypass graft   Follow-up with Vinnie Level in 3 months with lower extremity duplex and ABIs bilaterally

## 2015-03-07 NOTE — Interval H&P Note (Signed)
History and Physical Interval Note:  03/07/2015 7:45 AM  Gina [name]  has presented today for surgery, with the diagnosis of left fem-pop graft stenosis  The various methods of treatment have been discussed with the patient and family. After consideration of risks, benefits and other options for treatment, the patient has consented to  Procedure(s): Abdominal Aortogram (N/A) as a surgical intervention .  The patient's history has been reviewed, patient examined, no change in status, stable for surgery.  I have reviewed the patient's chart and labs.  Questions were answered to the patient's satisfaction.     Annamarie Major

## 2015-03-08 DIAGNOSIS — I739 Peripheral vascular disease, unspecified: Secondary | ICD-10-CM | POA: Diagnosis not present

## 2015-03-09 DIAGNOSIS — I739 Peripheral vascular disease, unspecified: Secondary | ICD-10-CM | POA: Diagnosis not present

## 2015-03-10 DIAGNOSIS — I739 Peripheral vascular disease, unspecified: Secondary | ICD-10-CM | POA: Diagnosis not present

## 2015-03-11 DIAGNOSIS — I739 Peripheral vascular disease, unspecified: Secondary | ICD-10-CM | POA: Diagnosis not present

## 2015-03-12 DIAGNOSIS — I739 Peripheral vascular disease, unspecified: Secondary | ICD-10-CM | POA: Diagnosis not present

## 2015-03-13 DIAGNOSIS — I739 Peripheral vascular disease, unspecified: Secondary | ICD-10-CM | POA: Diagnosis not present

## 2015-03-14 DIAGNOSIS — I739 Peripheral vascular disease, unspecified: Secondary | ICD-10-CM | POA: Diagnosis not present

## 2015-03-15 DIAGNOSIS — I739 Peripheral vascular disease, unspecified: Secondary | ICD-10-CM | POA: Diagnosis not present

## 2015-03-16 DIAGNOSIS — I739 Peripheral vascular disease, unspecified: Secondary | ICD-10-CM | POA: Diagnosis not present

## 2015-03-17 DIAGNOSIS — I739 Peripheral vascular disease, unspecified: Secondary | ICD-10-CM | POA: Diagnosis not present

## 2015-03-18 DIAGNOSIS — I739 Peripheral vascular disease, unspecified: Secondary | ICD-10-CM | POA: Diagnosis not present

## 2015-03-27 ENCOUNTER — Other Ambulatory Visit: Payer: Self-pay

## 2015-04-05 ENCOUNTER — Ambulatory Visit: Payer: Medicare Other | Admitting: Internal Medicine

## 2015-04-09 ENCOUNTER — Other Ambulatory Visit: Payer: Self-pay | Admitting: Internal Medicine

## 2015-04-10 DIAGNOSIS — H35352 Cystoid macular degeneration, left eye: Secondary | ICD-10-CM | POA: Diagnosis not present

## 2015-04-10 DIAGNOSIS — H35351 Cystoid macular degeneration, right eye: Secondary | ICD-10-CM | POA: Diagnosis not present

## 2015-04-10 DIAGNOSIS — H4011X3 Primary open-angle glaucoma, severe stage: Secondary | ICD-10-CM | POA: Diagnosis not present

## 2015-04-13 ENCOUNTER — Ambulatory Visit (INDEPENDENT_AMBULATORY_CARE_PROVIDER_SITE_OTHER): Payer: Medicare Other | Admitting: Internal Medicine

## 2015-04-13 ENCOUNTER — Encounter: Payer: Self-pay | Admitting: Internal Medicine

## 2015-04-13 VITALS — BP 163/42 | HR 60 | Temp 98.6°F | Ht 67.0 in | Wt 115.7 lb

## 2015-04-13 DIAGNOSIS — E890 Postprocedural hypothyroidism: Secondary | ICD-10-CM | POA: Diagnosis not present

## 2015-04-13 DIAGNOSIS — J00 Acute nasopharyngitis [common cold]: Secondary | ICD-10-CM

## 2015-04-13 DIAGNOSIS — E1151 Type 2 diabetes mellitus with diabetic peripheral angiopathy without gangrene: Secondary | ICD-10-CM

## 2015-04-13 DIAGNOSIS — I10 Essential (primary) hypertension: Secondary | ICD-10-CM

## 2015-04-13 DIAGNOSIS — D649 Anemia, unspecified: Secondary | ICD-10-CM

## 2015-04-13 DIAGNOSIS — Z9889 Other specified postprocedural states: Secondary | ICD-10-CM | POA: Diagnosis not present

## 2015-04-13 DIAGNOSIS — I739 Peripheral vascular disease, unspecified: Secondary | ICD-10-CM

## 2015-04-13 DIAGNOSIS — F17210 Nicotine dependence, cigarettes, uncomplicated: Secondary | ICD-10-CM

## 2015-04-13 DIAGNOSIS — Z720 Tobacco use: Secondary | ICD-10-CM

## 2015-04-13 DIAGNOSIS — IMO0001 Reserved for inherently not codable concepts without codable children: Secondary | ICD-10-CM

## 2015-04-13 DIAGNOSIS — E118 Type 2 diabetes mellitus with unspecified complications: Secondary | ICD-10-CM

## 2015-04-13 LAB — CBC
HCT: 27.3 % — ABNORMAL LOW (ref 36.0–46.0)
Hemoglobin: 8.5 g/dL — ABNORMAL LOW (ref 12.0–15.0)
MCH: 28 pg (ref 26.0–34.0)
MCHC: 31.1 g/dL (ref 30.0–36.0)
MCV: 89.8 fL (ref 78.0–100.0)
MPV: 9.4 fL (ref 8.6–12.4)
Platelets: 335 10*3/uL (ref 150–400)
RBC: 3.04 MIL/uL — ABNORMAL LOW (ref 3.87–5.11)
RDW: 17.1 % — ABNORMAL HIGH (ref 11.5–15.5)
WBC: 5.3 10*3/uL (ref 4.0–10.5)

## 2015-04-13 LAB — BASIC METABOLIC PANEL WITH GFR
BUN: 20 mg/dL (ref 6–23)
CO2: 29 mEq/L (ref 19–32)
Calcium: 8.8 mg/dL (ref 8.4–10.5)
Chloride: 105 mEq/L (ref 96–112)
Creat: 0.96 mg/dL (ref 0.50–1.10)
GFR, Est African American: 63 mL/min
GFR, Est Non African American: 55 mL/min — ABNORMAL LOW
Glucose, Bld: 91 mg/dL (ref 70–99)
Potassium: 4.2 mEq/L (ref 3.5–5.3)
Sodium: 140 mEq/L (ref 135–145)

## 2015-04-13 LAB — TSH: TSH: 45.706 u[IU]/mL — ABNORMAL HIGH (ref 0.350–4.500)

## 2015-04-13 LAB — GLUCOSE, CAPILLARY: Glucose-Capillary: 130 mg/dL — ABNORMAL HIGH (ref 65–99)

## 2015-04-13 LAB — POCT GLYCOSYLATED HEMOGLOBIN (HGB A1C): Hemoglobin A1C: 6.6

## 2015-04-13 MED ORDER — VARENICLINE TARTRATE 0.5 MG X 11 & 1 MG X 42 PO MISC: ORAL | Status: DC

## 2015-04-13 NOTE — Assessment & Plan Note (Signed)
Hgb A1C today is 6.6, same as A1C on 11/29/14. Her blood glucose today is 130.   She is diet-controlled. No changes to diabetic management.

## 2015-04-13 NOTE — Assessment & Plan Note (Signed)
Last TSH level was 138.357 on 11/29/2014. She was on Synthroid 75 mcg daily, is now on Synthroid 50 mcg per Endocrinology Dr. Cruzita Lederer. There was some question of compliance with Synthroid in the past, but patient states she is taking as directed now and feels much better than before.  Will recheck TSH today.

## 2015-04-13 NOTE — Assessment & Plan Note (Signed)
BP today is 163/42. Previous two readings: 130/62, 144/46. She is taking Lasix 20 mg once daily and Lisinopril-HCTZ 20-12.5 mg once daily. She admits that she was not taking her Lasix as directed for a while, but is now taking as instructed. She states that she has frequent urination and has to rush to the bathroom at times. Her last potassium was 3.5 on 03/07/2015.  Will check BMP now that she is taking Lasix regularly to assess potassium level.

## 2015-04-13 NOTE — Assessment & Plan Note (Signed)
Patient has expressed interest in smoking cessation and understands the effects of smoking on her multiple disease processes. She states that she has cut back to smoking about 1 pack over 3-4 days. She admits that she smokes a cigarette first thing in the morning. We counseled her on possible pharmacotherapy for smoking cessation and she would prefer oral medication over patch. She denies any history of seizure or depression.  Will start Varenicline (CHANTIX) start 0.5 mg tablet by mouth once daily for days 1-3, then increase to 0.5 mg twice daily for days 4-7, then increase to one 1.0 mg tablet twice daily. Patient was also given information on Varenicline and advised to quit smoking completely after 1 week of use, she understands and feels she will be able to do this.  Will follow up in 1 month for smoking cessation.

## 2015-04-13 NOTE — Progress Notes (Signed)
Internal Medicine Clinic Attending  I saw and evaluated the patient.  I personally confirmed the key portions of the history and exam documented by Dr. Zada Finders and I reviewed pertinent patient test results.  The assessment, diagnosis, and plan were formulated together and I agree with the documentation in the resident's note. ASA would be beneficial due to PAD but risks of bleeding 2/2 SB AVM's need to be weighed by PCP.

## 2015-04-13 NOTE — Progress Notes (Signed)
[name], female   DOB: 09-08-1929, 81 y.o.   MRN: [id number]   Subjective:   [name] female   DOB: Sep 16, 1929 81 y.o.   MRN: [id number]  HPI: Gina [name] is a 81 y.o. pleasant lady with PMH as listed below. She is here for follow up after peripheral vascular catheterization of bilateral femoral-popliteal bypass grafts on 03/07/2015. She is also complaining of recent congestion.  Please see problem based charting for discussion.   Past Medical History  Diagnosis Date  . Intraductal carcinoma 06/2003    Of right breast. s/p right partial mastectomy. // Followed by Dr. Marylene Buerger  . PVD (peripheral vascular disease)     S/P BL femoral-popliteal bypass surgery 03/2001 (right) and 01/2003 (left)  . Diabetes mellitus, type 2     Well controlled  . Hypertension   . Hyperlipidemia   . TB lung, latent     Treated with INH in 11/2007  . Arthritis     lt hip  . Thyroid disease     Graves disease  . Ulcer     gastric antral ulcer and AVMs  . Mesenteric ischemia   . Graves disease     HX OF GRAVES  . Anemia 11/30/2014  . Acute diastolic congestive heart failure 01/28/2013  . Transfusion history     last admission 11-30-14   Current Outpatient Prescriptions  Medication Sig Dispense Refill  . aspirin EC 81 MG tablet Take 81 mg by mouth daily.    Marland Kitchen atorvastatin (LIPITOR) 10 MG tablet Take 1 tablet (10 mg total) by mouth daily at 6 PM. (Patient not taking: Reported on 02/21/2015) 90 tablet 4  . calcium citrate-vitamin D (CITRACAL+D) 315-200 MG-UNIT per tablet Take 2 tablets by mouth 2 (two) times daily. 120 tablet 11  . cholecalciferol (VITAMIN D) 1000 UNITS tablet Take 1,000 Units by mouth daily.    . diclofenac sodium (VOLTAREN) 1 % GEL Apply 2 g topically 4 (four) times daily. (Patient taking differently: Apply 2 g topically as needed (for muscle pain). ) 1 Tube 0  . Docusate Sodium (DSS) 100 MG CAPS Take 100 mg by mouth daily. (Patient not  taking: Reported on 02/21/2015) 60 each 0  . dorzolamide-timolol (COSOPT) 22.3-6.8 MG/ML ophthalmic solution Place 1 drop into both eyes 2 (two) times daily.     . feeding supplement, RESOURCE BREEZE, (RESOURCE BREEZE) LIQD Take 1 Container by mouth 2 (two) times daily between meals. (Patient not taking: Reported on 02/21/2015)  0  . ferrous sulfate 325 (65 FE) MG tablet Take 1 tablet (325 mg total) by mouth 3 (three) times daily with meals. 90 tablet 3  . furosemide (LASIX) 20 MG tablet Take 1 tablet (20 mg total) by mouth daily. 30 tablet 0  . levothyroxine (SYNTHROID, LEVOTHROID) 50 MCG tablet Take 1.5 tablets (75 mcg total) by mouth daily. 60 tablet 2  . levothyroxine (SYNTHROID, LEVOTHROID) 50 MCG tablet TAKE 1 TABLET BY MOUTH EVERY DAY 90 tablet 0  . lisinopril-hydrochlorothiazide (PRINZIDE,ZESTORETIC) 20-12.5 MG per tablet TAKE 1 TABLET BY MOUTH EVERY DAY 30 tablet 0  . Multiple Vitamin (MULTIVITAMIN WITH MINERALS) TABS tablet Take 1 tablet by mouth daily.    . pantoprazole (PROTONIX) 40 MG tablet Take 40 mg by mouth daily.    . varenicline (CHANTIX PAK) 0.5 MG X 11 & 1 MG X 42 tablet Take one 0.5 mg tablet by mouth once daily for 3 days, then increase to one 0.5 mg  tablet twice daily for 4 days, then increase to one 1 mg tablet twice daily. 53 tablet 0   No current facility-administered medications for this visit.   Family History  Problem Relation Age of Onset  . Cancer Daughter 61    uterine cancer  . Cancer Father    History   Social History  . Marital Status: Widowed    Spouse Name: N/A  . Number of Children: N/A  . Years of Education: N/A   Occupational History  . nurse     Private duty nurse   Social History Main Topics  . Smoking status: Light Tobacco Smoker -- 0.20 packs/day for 64 years    Types: Cigarettes  . Smokeless tobacco: Never Used     Comment: started back smoking but cutting back.  . Alcohol Use: No  . Drug Use: No  . Sexual Activity: Not on file    Other Topics Concern  . None   Social History Narrative   Lives alone with home health aid Monday-Friday   Review of Systems: Review of Systems  Constitutional: Negative for chills, weight loss and diaphoresis.       Fever 1 week ago.  HENT: Positive for congestion. Negative for ear pain and sore throat.   Respiratory: Negative for hemoptysis, shortness of breath and wheezing.        Cough productive of white/yellow sputum last week. Improved today.  Cardiovascular: Positive for leg swelling. Negative for chest pain, palpitations and claudication.       Leg swelling after walking/sitting. Improved with lying down.  Gastrointestinal: Negative for heartburn, nausea, vomiting, abdominal pain, diarrhea, constipation, blood in stool and melena.  Genitourinary: Positive for urgency and frequency. Negative for dysuria and hematuria.  Musculoskeletal: Negative for myalgias, joint pain and falls.  Neurological: Negative for dizziness, seizures, weakness and headaches.  Endo/Heme/Allergies: Does not bruise/bleed easily.  Psychiatric/Behavioral: Negative for depression.    Objective:  Physical Exam: Filed Vitals:   04/13/15 0833  BP: 163/42  Pulse: 60  Temp: 98.6 F (37 C)  TempSrc: Oral  Height: 5\' 7"  (1.702 m)  Weight: 115 lb 11.2 oz (52.481 kg)  SpO2: 100%   Physical Exam  Constitutional: She is oriented to person, place, and time. She is cooperative.  Thin, pleasant lady. Accompanied by CNA with wheelchair. Does ambulate.  HENT:  Head: Normocephalic and atraumatic.  Right Ear: External ear normal.  Left Ear: External ear normal.  Mouth/Throat: Oropharynx is clear and moist. No oropharyngeal exudate.  Eyes: Conjunctivae are normal.  Neck: Normal range of motion. Neck supple.  Cardiovascular: Normal rate, regular rhythm and normal heart sounds.   Healed right femoral scar s/p catheterization.  Pulmonary/Chest: Effort normal and breath sounds normal. No respiratory distress.  She has no wheezes.  Abdominal: Soft. Bowel sounds are normal.  Musculoskeletal:       Right lower leg: She exhibits swelling. She exhibits no tenderness.       Left lower leg: She exhibits swelling. She exhibits no tenderness.  Neurological: She is alert and oriented to person, place, and time.  Skin: Skin is warm. No erythema.  Psychiatric: She has a normal mood and affect.    Assessment & Plan:  Please see problem based charting for current assessment and plan.

## 2015-04-13 NOTE — Patient Instructions (Signed)
Thank you for your visit.  It sounds like your doing well! Today we are going to check labs for your thyroid and anemia.  Your diabetes is well controlled, your Hgb A1C is 6.6. Today.  We will prescribe Varenicline (CHANTIX) to help with quitting smoking. As we discussed, after 1 week of starting this medication you have to stop smoking completely.   We will follow up in 1 month.  Varenicline oral tablets What is this medicine? VARENICLINE (var EN i kleen) is used to help people quit smoking. It can reduce the symptoms caused by stopping smoking. It is used with a patient support program recommended by your physician. This medicine may be used for other purposes; ask your health care provider or pharmacist if you have questions. COMMON BRAND NAME(S): Chantix What should I tell my health care provider before I take this medicine? They need to know if you have any of these conditions: -bipolar disorder, depression, schizophrenia or other mental illness -heart disease -if you often drink alcohol -kidney disease -peripheral vascular disease -seizures -stroke -suicidal thoughts, plans, or attempt; a previous suicide attempt by you or a family member -an unusual or allergic reaction to varenicline, other medicines, foods, dyes, or preservatives -pregnant or trying to get pregnant -breast-feeding How should I use this medicine? You should set a date to stop smoking and tell your doctor. Start this medicine one week before the quit date. You can also start taking this medicine before you choose a quit date, and then pick a quit date that is between 8 and 35 days of treatment with this medicine. Stick to your plan; ask about support groups or other ways to help you remain a 'quitter'. Take this medicine by mouth after eating. Take with a full glass of water. Follow the directions on the prescription label. Take your doses at regular intervals. Do not take your medicine more often than  directed. A special MedGuide will be given to you by the pharmacist with each prescription and refill. Be sure to read this information carefully each time. Talk to your pediatrician regarding the use of this medicine in children. This medicine is not approved for use in children. Overdosage: If you think you have taken too much of this medicine contact a poison control center or emergency room at once. NOTE: This medicine is only for you. Do not share this medicine with others. What if I miss a dose? If you miss a dose, take it as soon as you can. If it is almost time for your next dose, take only that dose. Do not take double or extra doses. What may interact with this medicine? -alcohol or any product that contains alcohol -insulin -other stop smoking aids -theophylline -warfarin This list may not describe all possible interactions. Give your health care provider a list of all the medicines, herbs, non-prescription drugs, or dietary supplements you use. Also tell them if you smoke, drink alcohol, or use illegal drugs. Some items may interact with your medicine. What should I watch for while using this medicine? Visit your doctor or health care professional for regular check ups. Ask for ongoing advice and encouragement from your doctor or healthcare professional, friends, and family to help you quit. If you smoke while on this medication, quit again Your mouth may get dry. Chewing sugarless gum or sucking hard candy, and drinking plenty of water may help. Contact your doctor if the problem does not go away or is severe. You may get drowsy or  dizzy. Do not drive, use machinery, or do anything that needs mental alertness until you know how this medicine affects you. Do not stand or sit up quickly, especially if you are an older patient. This reduces the risk of dizzy or fainting spells. The use of this medicine may increase the chance of suicidal thoughts or actions. Pay special attention to how  you are responding while on this medicine. Any worsening of mood, or thoughts of suicide or dying should be reported to your health care professional right away. What side effects may I notice from receiving this medicine? Side effects that you should report to your doctor or health care professional as soon as possible: -allergic reactions like skin rash, itching or hives, swelling of the face, lips, tongue, or throat -breathing problems -changes in vision -chest pain or chest tightness -confusion, trouble speaking or understanding -fast, irregular heartbeat -feeling faint or lightheaded, falls -fever -pain in legs when walking -problems with balance, talking, walking -redness, blistering, peeling or loosening of the skin, including inside the mouth -ringing in ears -seizures -sudden numbness or weakness of the face, arm or leg -suicidal thoughts or other mood changes -trouble passing urine or change in the amount of urine -unusual bleeding or bruising -unusually weak or tired Side effects that usually do not require medical attention (report to your doctor or health care professional if they continue or are bothersome): -constipation -headache -nausea, vomiting -strange dreams -stomach gas -trouble sleeping This list may not describe all possible side effects. Call your doctor for medical advice about side effects. You may report side effects to FDA at 1-800-FDA-1088. Where should I keep my medicine? Keep out of the reach of children. Store at room temperature between 15 and 30 degrees C (59 and 86 degrees F). Throw away any unused medicine after the expiration date. NOTE: This sheet is a summary. It may not cover all possible information. If you have questions about this medicine, talk to your doctor, pharmacist, or health care provider.  2015, Elsevier/Gold Standard. (2013-06-28 13:37:47)

## 2015-04-13 NOTE — Assessment & Plan Note (Signed)
Patient has history of symptomatic anemia with transfusion on 12/01/2104. Last Hgb on 03/06/2105 was 9.2. She is S/P peripheral vascular procedure. Has history of GI AVMs, most recently ablated on 12/16/2014. She denies any weakness, dizziness, hematemesis, blood in stool or urine. On physical exam there is no conjunctival pallor, capillary refill is intact, no signs of blood loss.  Will check CBC today to assess Hgb compared to pre-op level from 03/07/2015.

## 2015-04-13 NOTE — Assessment & Plan Note (Signed)
Patient is visiting [place] after peripheral vascular catheterization of bilateral femoral-popliteal bypass grafts on 03/07/15. She was found to have narrowing likely secondary to calcification of the left bypass graft, distal lesion 90%, treated with scoring balloon. She states that before the procedure she had swelling of the legs, more on the left side, and aching pains in both legs after walking for short period of time. In the time after surgery, she states that her swelling is much improved and has no claudication. There is still some swelling in both legs, both patient and CNA report that it is much better than before.  She is ambulating well, no falls since December. There is no tenderness to palpation of legs. Surgical site is healed. She was on aspirin prior to discontinuation several months ago.  Will not start back on aspirin at this time. Consider restarting in future per PCP or by [facility] follow up in 1 month.

## 2015-04-13 NOTE — Assessment & Plan Note (Signed)
Patient states that about 1.5 weeks ago she started having cold symptoms with fever, congestion, and cough productive of white/yellowish sputum which lasted 3-4 days. She states that she took OTC cold medication and is feeling much better the last few days with only slight congestion remaining.  Likely a viral process, no changes in management.

## 2015-05-02 ENCOUNTER — Other Ambulatory Visit: Payer: Self-pay | Admitting: Internal Medicine

## 2015-05-03 ENCOUNTER — Other Ambulatory Visit: Payer: Self-pay | Admitting: Internal Medicine

## 2015-05-04 ENCOUNTER — Other Ambulatory Visit: Payer: Self-pay | Admitting: Internal Medicine

## 2015-05-04 MED ORDER — LISINOPRIL-HYDROCHLOROTHIAZIDE 20-12.5 MG PO TABS: 1.0000 | ORAL_TABLET | Freq: Every day | ORAL | Status: DC

## 2015-05-11 ENCOUNTER — Encounter: Payer: Self-pay | Admitting: Internal Medicine

## 2015-05-11 ENCOUNTER — Encounter: Payer: Self-pay | Admitting: Student-PharmD

## 2015-05-11 ENCOUNTER — Ambulatory Visit (INDEPENDENT_AMBULATORY_CARE_PROVIDER_SITE_OTHER): Payer: Medicare Other | Admitting: Internal Medicine

## 2015-05-11 VITALS — BP 177/38 | HR 66 | Temp 99.0°F | Ht 67.0 in | Wt 114.1 lb

## 2015-05-11 DIAGNOSIS — I739 Peripheral vascular disease, unspecified: Secondary | ICD-10-CM

## 2015-05-11 DIAGNOSIS — E890 Postprocedural hypothyroidism: Secondary | ICD-10-CM

## 2015-05-11 DIAGNOSIS — I10 Essential (primary) hypertension: Secondary | ICD-10-CM

## 2015-05-11 DIAGNOSIS — F17210 Nicotine dependence, cigarettes, uncomplicated: Secondary | ICD-10-CM | POA: Diagnosis not present

## 2015-05-11 DIAGNOSIS — Z720 Tobacco use: Secondary | ICD-10-CM

## 2015-05-11 DIAGNOSIS — Z0000 Encounter for general adult medical examination without abnormal findings: Secondary | ICD-10-CM

## 2015-05-11 NOTE — Progress Notes (Signed)
Internal Medicine Clinic Attending  Case discussed with Dr. Gill soon after the resident saw the patient.  We reviewed the resident's history and exam and pertinent patient test results.  I agree with the assessment, diagnosis, and plan of care documented in the resident's note.  

## 2015-05-11 NOTE — Patient Instructions (Signed)
Thank you for your visit today.   Please return to the internal medicine clinic in 4-8 weeks or sooner if needed.  Please schedule with your primary doctor.    I have made the following additions/changes to your medications: Continue taking your current meds.   We will check your thyroid today and your electrolytes.    Please be sure to bring all of your medications with you to every visit; this includes herbal supplements, vitamins, eye drops, and any over-the-counter medications.   Should you have any questions regarding your medications and/or any new or worsening symptoms, please be sure to call the clinic at [phone].   If you believe that you are suffering from a life threatening condition or one that may result in the loss of limb or function, then you should call 911 and proceed to the nearest Emergency Department.  Levothyroxine tablets What is this medicine? LEVOTHYROXINE (lee voe thye ROX een) is a thyroid hormone. This medicine can improve symptoms of thyroid deficiency such as slow speech, lack of energy, weight gain, hair loss, dry skin, and feeling cold. It also helps to treat goiter (an enlarged thyroid gland). It is also used to treat some kinds of thyroid cancer along with surgery and other medicines. This medicine may be used for other purposes; ask your health care provider or pharmacist if you have questions. COMMON BRAND NAME(S): Estre, Levo-T, Levothroid, Levoxyl, Synthroid, Thyro-Tabs, Unithroid What should I tell my health care provider before I take this medicine? They need to know if you have any of these conditions: -angina -blood clotting problems -diabetes -dieting or on a weight loss program -fertility problems -heart disease -high levels of thyroid hormone -pituitary gland problem -previous heart attack -an unusual or allergic reaction to levothyroxine, thyroid hormones, other medicines, foods, dyes, or preservatives -pregnant or trying to get  pregnant -breast-feeding How should I use this medicine? Take this medicine by mouth with plenty of water. It is best to take on an empty stomach, at least 30 minutes before or 2 hours after food. Follow the directions on the prescription label. Take at the same time each day. Do not take your medicine more often than directed. Contact your pediatrician regarding the use of this medicine in children. While this drug may be prescribed for children and infants as young as a few days of age for selected conditions, precautions do apply. For infants, you may crush the tablet and place in a small amount of (5-10 ml or 1 to 2 teaspoonfuls) of water, breast milk, or non-soy based infant formula. Do not mix with soy-based infant formula. Give as directed. Overdosage: If you think you have taken too much of this medicine contact a poison control center or emergency room at once. NOTE: This medicine is only for you. Do not share this medicine with others. What if I miss a dose? If you miss a dose, take it as soon as you can. If it is almost time for your next dose, take only that dose. Do not take double or extra doses. What may interact with this medicine? -amiodarone -antacids -anti-thyroid medicines -calcium supplements -carbamazepine -cholestyramine -colestipol -digoxin -female hormones, including contraceptive or birth control pills -iron supplements -ketamine -liquid nutrition products like Ensure -medicines for colds and breathing difficulties -medicines for diabetes -medicines for mental depression -medicines or herbals used to decrease weight or appetite -phenobarbital or other barbiturate medications -phenytoin -prednisone or other corticosteroids -rifabutin -rifampin -soy isoflavones -sucralfate -theophylline -warfarin This list may not  describe all possible interactions. Give your health care provider a list of all the medicines, herbs, non-prescription drugs, or dietary  supplements you use. Also tell them if you smoke, drink alcohol, or use illegal drugs. Some items may interact with your medicine. What should I watch for while using this medicine? Be sure to take this medicine with plenty of fluids. Some tablets may cause choking, gagging, or difficulty swallowing from the tablet getting stuck in your throat. Most of these problems disappear if the medicine is taken with the right amount of water or other fluids. Do not switch brands of this medicine unless your health care professional agrees with the change. Ask questions if you are uncertain. You will need regular exams and occasional blood tests to check the response to treatment. If you are receiving this medicine for an underactive thyroid, it may be several weeks before you notice an improvement. Check with your doctor or health care professional if your symptoms do not improve. It may be necessary for you to take this medicine for the rest of your life. Do not stop using this medicine unless your doctor or health care professional advises you to. This medicine can affect blood sugar levels. If you have diabetes, check your blood sugar as directed. You may lose some of your hair when you first start treatment. With time, this usually corrects itself. If you are going to have surgery, tell your doctor or health care professional that you are taking this medicine. What side effects may I notice from receiving this medicine? Side effects that you should report to your doctor or health care professional as soon as possible: -allergic reactions like skin rash, itching or hives, swelling of the face, lips, or tongue -chest pain -excessive sweating or intolerance to heat -fast or irregular heartbeat -nervousness -skin rash or hives -swelling of ankles, feet, or legs -tremors Side effects that usually do not require medical attention (report to your doctor or health care professional if they continue or are  bothersome): -changes in appetite -changes in menstrual periods -diarrhea -hair loss -headache -trouble sleeping -weight loss This list may not describe all possible side effects. Call your doctor for medical advice about side effects. You may report side effects to FDA at 1-800-FDA-1088. Where should I keep my medicine? Keep out of the reach of children. Store at room temperature between 15 and 30 degrees C (59 and 86 degrees F). Protect from light and moisture. Keep container tightly closed. Throw away any unused medicine after the expiration date. NOTE: This sheet is a summary. It may not cover all possible information. If you have questions about this medicine, talk to your doctor, pharmacist, or health care provider.  2015, Elsevier/Gold Standard. (2008-12-23 14:28:07)

## 2015-05-11 NOTE — Assessment & Plan Note (Signed)
Reports she wants to quit smoking and that this appt was mainly to see how she was doing with chantix.  Unfortunately, she has not been taking chantix as it may have not been called in vs. her not picking up the prescription. -again, will have Dr. Maudie Mercury look into compliance issues and meet with pt today

## 2015-05-11 NOTE — Assessment & Plan Note (Signed)
Unlikely benefit of preventative care at this point. -does need pneumovax and prevnar but apparently refused in the past -also will need influenza vaccine if agreeable

## 2015-05-11 NOTE — Progress Notes (Unsigned)
[name], female   DOB: 1929/06/17, 81 y.o.   MRN: [id number] Spoke with patient about how she takes her medications.  Concerns about not taking them and not filling prescriptions.  Has had many changes in her aids/helpers recently who are supposed to pick up medicines, which may have contributed.  Set patient up with [facility] for free delivery, possible adherence packaging and smoking cessation program (including Chantix).

## 2015-05-11 NOTE — Progress Notes (Signed)
[name], female   DOB: 1929-02-09, 81 y.o.   MRN: [id number]     Subjective:   [name] female    DOB: April 24, 1929 81 y.o.    MRN: [id number] Health Maintenance Due: Health Maintenance Due  Topic Date Due  . OPHTHALMOLOGY EXAM  09/04/1941  . TETANUS/TDAP  09/04/1950  . PNA vac Low Risk Adult (1 of 2 - PCV13) 09/04/1996  . LIPID PANEL  07/02/2013  . MAMMOGRAM  08/11/2013  . COLONOSCOPY  01/10/2014  . FOOT EXAM  02/03/2015  . INFLUENZA VACCINE  05/01/2015    _________________________________________________  HPI: [name] is a 81 y.o. female here for a routine visit  for f/u since starting chantix.  Pt has a PMH outlined below.  Please see problem-based charting assessment and plan for further details of medical issues addressed at today's visit.  PMH: Past Medical History  Diagnosis Date  . Intraductal carcinoma 06/2003    Of right breast. s/p right partial mastectomy. // Followed by Dr. Marylene [name]  . PVD (peripheral vascular disease)     S/P BL femoral-popliteal bypass surgery 03/2001 (right) and 01/2003 (left)  . Diabetes mellitus, type 2     Well controlled  . Hypertension   . Hyperlipidemia   . TB lung, latent     Treated with INH in 11/2007  . Arthritis     lt hip  . Thyroid disease     Graves disease  . Ulcer     gastric antral ulcer and AVMs  . Mesenteric ischemia   . Graves disease     HX OF GRAVES  . Anemia 11/30/2014  . Acute diastolic congestive heart failure 01/28/2013  . Transfusion history     last admission 11-30-14    Medications: Current Outpatient Prescriptions on File Prior to Visit  Medication Sig Dispense Refill  . aspirin EC 81 MG tablet Take 81 mg by mouth daily.    Marland Kitchen atorvastatin (LIPITOR) 10 MG tablet Take 1 tablet (10 mg total) by mouth daily at 6 PM. 90 tablet 4  . calcium citrate-vitamin D (CITRACAL+D) 315-200 MG-UNIT per tablet Take 2 tablets by mouth 2 (two) times daily. 120 tablet 11   . cholecalciferol (VITAMIN D) 1000 UNITS tablet Take 1,000 Units by mouth daily.    . diclofenac sodium (VOLTAREN) 1 % GEL Apply 2 g topically 4 (four) times daily. (Patient taking differently: Apply 2 g topically as needed (for muscle pain). ) 1 Tube 0  . Docusate Sodium (DSS) 100 MG CAPS Take 100 mg by mouth daily. 60 each 0  . dorzolamide-timolol (COSOPT) 22.3-6.8 MG/ML ophthalmic solution Place 1 drop into both eyes 2 (two) times daily.     . feeding supplement, RESOURCE BREEZE, (RESOURCE BREEZE) LIQD Take 1 Container by mouth 2 (two) times daily between meals.  0  . ferrous sulfate 325 (65 FE) MG tablet Take 1 tablet (325 mg total) by mouth 3 (three) times daily with meals. 90 tablet 3  . furosemide (LASIX) 20 MG tablet Take 1 tablet (20 mg total) by mouth daily. 30 tablet 0  . levothyroxine (SYNTHROID, LEVOTHROID) 50 MCG tablet Take 1.5 tablets (75 mcg total) by mouth daily. (Patient not taking: Reported on 04/13/2015) 60 tablet 2  . levothyroxine (SYNTHROID, LEVOTHROID) 50 MCG tablet TAKE 1 TABLET BY MOUTH EVERY DAY 90 tablet 0  . lisinopril-hydrochlorothiazide (PRINZIDE,ZESTORETIC) 20-12.5 MG per tablet Take 1 tablet by mouth daily. 90 tablet 0  . Multiple  Vitamin (MULTIVITAMIN WITH MINERALS) TABS tablet Take 1 tablet by mouth daily.    . pantoprazole (PROTONIX) 40 MG tablet Take 40 mg by mouth daily.    . varenicline (CHANTIX PAK) 0.5 MG X 11 & 1 MG X 42 tablet Take one 0.5 mg tablet by mouth once daily for 3 days, then increase to one 0.5 mg tablet twice daily for 4 days, then increase to one 1 mg tablet twice daily. 53 tablet 0   No current facility-administered medications on file prior to visit.    Allergies: No Active Allergies  FH: Family History  Problem Relation Age of Onset  . Cancer Daughter 53    uterine cancer  . Cancer Father     SH: Social History   Social History  . Marital Status: Widowed    Spouse Name: N/A  . Number of Children: N/A  . Years of  Education: N/A   Occupational History  . nurse     Private duty nurse   Social History Main Topics  . Smoking status: Light Tobacco Smoker -- 0.20 packs/day for 64 years    Types: Cigarettes  . Smokeless tobacco: Never Used     Comment: started back smoking but cutting back.  . Alcohol Use: No  . Drug Use: No  . Sexual Activity: Not on file   Other Topics Concern  . Not on file   Social History Narrative   Lives alone with home health aid Monday-Friday    Review of Systems: Constitutional: Negative for fever, chills and weight loss.  Eyes: Negative for blurred vision.  Respiratory: Negative for cough and shortness of breath.  Cardiovascular: Negative for chest pain, palpitations and leg swelling.  Gastrointestinal: Negative for nausea, vomiting, abdominal pain, diarrhea, constipation and blood in stool.  Genitourinary: Negative for dysuria, urgency and frequency.  Musculoskeletal: Negative for myalgias and back pain.  Neurological: Negative for dizziness, weakness and headaches.     Objective:   Vital Signs: Filed Vitals:   05/11/15 0843  BP: 177/38  Pulse: 66  Temp: 99 F (37.2 C)  TempSrc: Oral  Height: 5\' 7"  (1.702 m)  Weight: 114 lb 1.6 oz (51.755 kg)  SpO2: 99%     BP Readings from Last 3 Encounters:  05/11/15 177/38  04/13/15 163/42  03/07/15 194/31    Physical Exam: Constitutional: Vital signs reviewed.  Patient is an elderly female sitting in wheelchair NAD and cooperative with exam.  Head: Normocephalic and atraumatic. Eyes: EOMI, conjunctivae nl, no scleral icterus.  Neck: Supple. Cardiovascular: RRR, no MRG. Pulmonary/Chest: normal effort, CTAB, no wheezes, rales, or rhonchi. Abdominal: Soft. NT/ND +BS. Neurological: A&O x3, cranial nerves II-XII are grossly intact, moving all extremities. Extremities: 2+DP b/l; 1+pitting edema b/l.  Skin: Warm, dry and intact. No rash.   Assessment & Plan:   Assessment and plan was discussed and  formulated with my attending.

## 2015-05-11 NOTE — Assessment & Plan Note (Addendum)
BP today 177/38.  Reports compliance with all meds but had Dr. Maudie Mercury run refill list and has not picked up lasix since May?  Pharmacy met with him and pt agreed to have meds delivered now through [facility] to help with compliance.   -referral to Dr Maudie Mercury for med compliance issues--> met with pt today (see above) -no reason to recheck BMP today given not taking lasix

## 2015-05-11 NOTE — Assessment & Plan Note (Addendum)
Doubt compliance with meds given elevated TSH.  She was supposed to be taking 43mcg per endocrinology.  However, she states compliance with all meds and she takes synthroid with breakfast.  She has an aide that is with her 7 days a week from 8:30-11AM.  The aide has not been filling her pill box but has relied on Gina [name] to take her own meds.  I suggested that the aide assist her with filling her pill box to improve compliance.  Gina [name] also admits that she has difficulty with her meds.  It appears at one point she was on 21mcg but was decreased to 62mcg?   -stressed importance of taking correctly at least 30 min prior to breakfast -will ask Dr. Maudie Mercury to see if she is actually picking up her refills (not filled synthroid in recent months since May) -would keep her on 35mcg and will check TSH today; may increase at next OV if compliant with 64mcg dosage and TSH still elevated  -f/u with PCP in 4-8 weeks  -with Dr Julianne Rice help, she is agreeable to receiving her meds home delivered by [facility]

## 2015-05-11 NOTE — Assessment & Plan Note (Addendum)
Fem-pop not on ASA in 2002 R and 2004 L.  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 Trula Slade in June.  -discuss starting ASA at next [place]

## 2015-05-12 LAB — TSH: TSH: 39.77 u[IU]/mL — ABNORMAL HIGH (ref 0.450–4.500)

## 2015-05-12 NOTE — Progress Notes (Signed)
Quick Note:  Pt reports compliance with synthroid 30mcg daily but Dr. Maudie Mercury called pharmacy and has not picked up synthroid since May. We forward to [facility] to see if [place] can put meds in pill box and make sure pt is taking. Also had Dr. Maudie Mercury speak with pt and agreed to have meds delivered by [facility] to improve compliance. ______

## 2015-05-15 ENCOUNTER — Other Ambulatory Visit: Payer: Self-pay | Admitting: Internal Medicine

## 2015-05-15 DIAGNOSIS — R410 Disorientation, unspecified: Secondary | ICD-10-CM

## 2015-05-15 DIAGNOSIS — E038 Other specified hypothyroidism: Secondary | ICD-10-CM

## 2015-05-18 ENCOUNTER — Other Ambulatory Visit: Payer: Self-pay | Admitting: *Deleted

## 2015-05-19 MED ORDER — ATORVASTATIN CALCIUM 10 MG PO TABS: 10.0000 mg | ORAL_TABLET | Freq: Every day | ORAL | Status: DC

## 2015-05-23 ENCOUNTER — Telehealth: Payer: Self-pay | Admitting: Licensed Clinical Social Worker

## 2015-05-23 DIAGNOSIS — J449 Chronic obstructive pulmonary disease, unspecified: Secondary | ICD-10-CM

## 2015-05-23 DIAGNOSIS — I5031 Acute diastolic (congestive) heart failure: Secondary | ICD-10-CM

## 2015-05-23 NOTE — Telephone Encounter (Signed)
Ms. Byes was referred to CSW for home health services and community care management.  Pt is on the listing for as eligible for [facility] Care Management.  CSW placed call to Ms. Hansford.  Pt agreeable to [facility] services and does not have a preference to agency.  Ms. Medlock states a nurse comes to her home yearly, most likely PCS assessment or insurance.  CSW discussed community care management available via telephone or home visit, pt agreeable.  Pt notified referral for [facility] RN has been made to [facility] and to expect call from this agency as well as [place].  Pt denies add'l social work needs at this time.

## 2015-05-24 NOTE — Consult Note (Signed)
I have assigned Gina [name] referral to [place] Management Coordinator on 05/24/2015.

## 2015-05-25 DIAGNOSIS — E1151 Type 2 diabetes mellitus with diabetic peripheral angiopathy without gangrene: Secondary | ICD-10-CM | POA: Diagnosis not present

## 2015-05-25 DIAGNOSIS — I10 Essential (primary) hypertension: Secondary | ICD-10-CM | POA: Diagnosis not present

## 2015-05-25 DIAGNOSIS — Z720 Tobacco use: Secondary | ICD-10-CM | POA: Diagnosis not present

## 2015-05-25 DIAGNOSIS — E039 Hypothyroidism, unspecified: Secondary | ICD-10-CM | POA: Diagnosis not present

## 2015-05-29 DIAGNOSIS — Z720 Tobacco use: Secondary | ICD-10-CM | POA: Diagnosis not present

## 2015-05-29 DIAGNOSIS — I10 Essential (primary) hypertension: Secondary | ICD-10-CM | POA: Diagnosis not present

## 2015-05-29 DIAGNOSIS — E1151 Type 2 diabetes mellitus with diabetic peripheral angiopathy without gangrene: Secondary | ICD-10-CM | POA: Diagnosis not present

## 2015-05-29 DIAGNOSIS — E039 Hypothyroidism, unspecified: Secondary | ICD-10-CM | POA: Diagnosis not present

## 2015-05-30 ENCOUNTER — Other Ambulatory Visit: Payer: Self-pay | Admitting: *Deleted

## 2015-05-30 DIAGNOSIS — R0600 Dyspnea, unspecified: Secondary | ICD-10-CM

## 2015-05-30 DIAGNOSIS — R600 Localized edema: Secondary | ICD-10-CM

## 2015-06-01 MED ORDER — FUROSEMIDE 20 MG PO TABS: 20.0000 mg | ORAL_TABLET | Freq: Every day | ORAL | Status: DC

## 2015-06-06 ENCOUNTER — Other Ambulatory Visit: Payer: Self-pay | Admitting: *Deleted

## 2015-06-06 ENCOUNTER — Encounter: Payer: Self-pay | Admitting: *Deleted

## 2015-06-06 NOTE — Progress Notes (Signed)
This encounter was created in error - please disregard.

## 2015-06-06 NOTE — Patient Outreach (Signed)
Referral received from care manager assistant for disease management for member.  Call placed to member, identity verified.  This care manager introduced self and purpose of call.  [facility] care management services explained, member declines services at this time, stating "I have enough people coming in and out."    Will notify care management assistant of member's decision to decline services.  Valente David, BSN, [place] Management  [facility] Care Manager [phone]

## 2015-06-07 NOTE — Patient Outreach (Signed)
[facility]) Care Management  06/07/2015  Gina [name] December 03, 1928 [id number]   Notification from Valente David, RN to close case due to patient refused [place] Management services.  Thanks, Ronnell Freshwater. [place], [place] Assistant Phone: [phone] Fax: [phone]

## 2015-06-08 ENCOUNTER — Encounter (HOSPITAL_COMMUNITY): Payer: Medicare Other

## 2015-06-08 ENCOUNTER — Ambulatory Visit: Payer: Medicare Other | Admitting: Family

## 2015-06-12 ENCOUNTER — Encounter: Payer: Self-pay | Admitting: Family

## 2015-06-13 ENCOUNTER — Emergency Department (HOSPITAL_COMMUNITY)
Admission: EM | Admit: 2015-06-13 | Discharge: 2015-06-13 | Disposition: A | Discharge status: Home / Self Care | Payer: Medicare Other | Attending: Emergency Medicine | Admitting: Emergency Medicine

## 2015-06-13 ENCOUNTER — Other Ambulatory Visit: Payer: Self-pay | Admitting: Surgery

## 2015-06-13 ENCOUNTER — Ambulatory Visit (INDEPENDENT_AMBULATORY_CARE_PROVIDER_SITE_OTHER): Payer: Medicare Other | Admitting: Family

## 2015-06-13 ENCOUNTER — Encounter: Payer: Self-pay | Admitting: Family

## 2015-06-13 ENCOUNTER — Emergency Department (HOSPITAL_COMMUNITY): Payer: Medicare Other

## 2015-06-13 ENCOUNTER — Ambulatory Visit (INDEPENDENT_AMBULATORY_CARE_PROVIDER_SITE_OTHER)
Admission: RE | Admit: 2015-06-13 | Discharge: 2015-06-13 | Disposition: A | Discharge status: Home / Self Care | Payer: Medicare Other | Source: Ambulatory Visit | Attending: Surgery | Admitting: Surgery

## 2015-06-13 ENCOUNTER — Encounter (HOSPITAL_COMMUNITY): Payer: Self-pay | Admitting: Emergency Medicine

## 2015-06-13 ENCOUNTER — Other Ambulatory Visit: Payer: Self-pay

## 2015-06-13 VITALS — BP 201/64 | HR 61 | Temp 97.0°F | Resp 16 | Ht 67.0 in | Wt 115.0 lb

## 2015-06-13 DIAGNOSIS — E079 Disorder of thyroid, unspecified: Secondary | ICD-10-CM | POA: Diagnosis not present

## 2015-06-13 DIAGNOSIS — Z95828 Presence of other vascular implants and grafts: Secondary | ICD-10-CM

## 2015-06-13 DIAGNOSIS — Z9889 Other specified postprocedural states: Secondary | ICD-10-CM | POA: Diagnosis not present

## 2015-06-13 DIAGNOSIS — I10 Essential (primary) hypertension: Secondary | ICD-10-CM | POA: Insufficient documentation

## 2015-06-13 DIAGNOSIS — E785 Hyperlipidemia, unspecified: Secondary | ICD-10-CM | POA: Diagnosis not present

## 2015-06-13 DIAGNOSIS — I5031 Acute diastolic (congestive) heart failure: Secondary | ICD-10-CM | POA: Diagnosis not present

## 2015-06-13 DIAGNOSIS — I739 Peripheral vascular disease, unspecified: Secondary | ICD-10-CM | POA: Diagnosis not present

## 2015-06-13 DIAGNOSIS — D649 Anemia, unspecified: Secondary | ICD-10-CM | POA: Insufficient documentation

## 2015-06-13 DIAGNOSIS — Z8719 Personal history of other diseases of the digestive system: Secondary | ICD-10-CM | POA: Insufficient documentation

## 2015-06-13 DIAGNOSIS — R2243 Localized swelling, mass and lump, lower limb, bilateral: Secondary | ICD-10-CM | POA: Diagnosis not present

## 2015-06-13 DIAGNOSIS — Z79899 Other long term (current) drug therapy: Secondary | ICD-10-CM | POA: Diagnosis not present

## 2015-06-13 DIAGNOSIS — Z720 Tobacco use: Secondary | ICD-10-CM | POA: Insufficient documentation

## 2015-06-13 DIAGNOSIS — Z48812 Encounter for surgical aftercare following surgery on the circulatory system: Secondary | ICD-10-CM | POA: Diagnosis not present

## 2015-06-13 DIAGNOSIS — Z86018 Personal history of other benign neoplasm: Secondary | ICD-10-CM | POA: Diagnosis not present

## 2015-06-13 DIAGNOSIS — F17200 Nicotine dependence, unspecified, uncomplicated: Secondary | ICD-10-CM

## 2015-06-13 DIAGNOSIS — F17210 Nicotine dependence, cigarettes, uncomplicated: Secondary | ICD-10-CM | POA: Diagnosis not present

## 2015-06-13 DIAGNOSIS — R0989 Other specified symptoms and signs involving the circulatory and respiratory systems: Secondary | ICD-10-CM

## 2015-06-13 DIAGNOSIS — E119 Type 2 diabetes mellitus without complications: Secondary | ICD-10-CM | POA: Insufficient documentation

## 2015-06-13 DIAGNOSIS — Z7982 Long term (current) use of aspirin: Secondary | ICD-10-CM | POA: Insufficient documentation

## 2015-06-13 DIAGNOSIS — M1990 Unspecified osteoarthritis, unspecified site: Secondary | ICD-10-CM | POA: Diagnosis not present

## 2015-06-13 NOTE — ED Notes (Signed)
Sent here from [place] office with high blood pressure-- states has taken her medicine today, denies any other complaints

## 2015-06-13 NOTE — Patient Instructions (Signed)
Peripheral Vascular Disease Peripheral Vascular Disease (PVD), also called Peripheral Arterial Disease (PAD), is a circulation problem caused by cholesterol (atherosclerotic plaque) deposits in the arteries. PVD commonly occurs in the lower extremities (legs) but it can occur in other areas of the body, such as your arms. The cholesterol buildup in the arteries reduces blood flow which can cause pain and other serious problems. The presence of PVD can place a person at risk for Coronary Artery Disease (CAD).  CAUSES  Causes of PVD can be many. It is usually associated with more than one risk factor such as:   High Cholesterol.  Smoking.  Diabetes.  Lack of exercise or inactivity.  High blood pressure (hypertension).  Obesity.  Family history. SYMPTOMS   When the lower extremities are affected, patients with PVD may experience:  Leg pain with exertion or physical activity. This is called INTERMITTENT CLAUDICATION. This may present as cramping or numbness with physical activity. The location of the pain is associated with the level of blockage. For example, blockage at the abdominal level (distal abdominal aorta) may result in buttock or hip pain. Lower leg arterial blockage may result in calf pain.  As PVD becomes more severe, pain can develop with less physical activity.  In people with severe PVD, leg pain may occur at rest.  Other PVD signs and symptoms:  Leg numbness or weakness.  Coldness in the affected leg or foot, especially when compared to the other leg.  A change in leg color.  Patients with significant PVD are more prone to ulcers or sores on toes, feet or legs. These may take longer to heal or may reoccur. The ulcers or sores can become infected.  If signs and symptoms of PVD are ignored, gangrene may occur. This can result in the loss of toes or loss of an entire limb.  Not all leg pain is related to PVD. Other medical conditions can cause leg pain such  as:  Blood clots (embolism) or Deep Vein Thrombosis.  Inflammation of the blood vessels (vasculitis).  Spinal stenosis. DIAGNOSIS  Diagnosis of PVD can involve several different types of tests. These can include:  Pulse Volume Recording Method (PVR). This test is simple, painless and does not involve the use of X-rays. PVR involves measuring and comparing the blood pressure in the arms and legs. An ABI (Ankle-Brachial Index) is calculated. The normal ratio of blood pressures is 1. As this number becomes smaller, it indicates more severe disease.  < 0.95 - indicates significant narrowing in one or more leg vessels.  <0.8 - there will usually be pain in the foot, leg or buttock with exercise.  <0.4 - will usually have pain in the legs at rest.  <0.25 - usually indicates limb threatening PVD.  Doppler detection of pulses in the legs. This test is painless and checks to see if you have a pulses in your legs/feet.  A dye or contrast material (a substance that highlights the blood vessels so they show up on x-ray) may be given to help your caregiver better see the arteries for the following tests. The dye is eliminated from your body by the kidney's. Your caregiver may order blood work to check your kidney function and other laboratory values before the following tests are performed:  Magnetic Resonance Angiography (MRA). An MRA is a picture study of the blood vessels and arteries. The MRA machine uses a large magnet to produce images of the blood vessels.  Computed Tomography Angiography (CTA). A CTA   is a specialized x-ray that looks at how the blood flows in your blood vessels. An IV may be inserted into your arm so contrast dye can be injected.  Angiogram. Is a procedure that uses x-rays to look at your blood vessels. This procedure is minimally invasive, meaning a small incision (cut) is made in your groin. A small tube (catheter) is then inserted into the artery of your groin. The catheter  is guided to the blood vessel or artery your caregiver wants to examine. Contrast dye is injected into the catheter. X-rays are then taken of the blood vessel or artery. After the images are obtained, the catheter is taken out. TREATMENT  Treatment of PVD involves many interventions which may include:  Lifestyle changes:  Quitting smoking.  Exercise.  Following a low fat, low cholesterol diet.  Control of diabetes.  Foot care is very important to the PVD patient. Good foot care can help prevent infection.  Medication:  Cholesterol-lowering medicine.  Blood pressure medicine.  Anti-platelet drugs.  Certain medicines may reduce symptoms of Intermittent Claudication.  Interventional/Surgical options:  Angioplasty. An Angioplasty is a procedure that inflates a balloon in the blocked artery. This opens the blocked artery to improve blood flow.  Stent Implant. A wire mesh tube (stent) is placed in the artery. The stent expands and stays in place, allowing the artery to remain open.  Peripheral Bypass Surgery. This is a surgical procedure that reroutes the blood around a blocked artery to help improve blood flow. This type of procedure may be performed if Angioplasty or stent implants are not an option. SEEK IMMEDIATE MEDICAL CARE IF:   You develop pain or numbness in your arms or legs.  Your arm or leg turns cold, becomes blue in color.  You develop redness, warmth, swelling and pain in your arms or legs. MAKE SURE YOU:   Understand these instructions.  Will watch your condition.  Will get help right away if you are not doing well or get worse. Document Released: 10/24/2004 Document Revised: 12/09/2011 Document Reviewed: 09/20/2008 ExitCare Patient Information 2015 [employer]. This information is not intended to replace advice given to you by your health care provider. Make sure you discuss any questions you have with your health care provider.   Smoking  Cessation Quitting smoking is important to your health and has many advantages. However, it is not always easy to quit since nicotine is a very addictive drug. Oftentimes, people try 3 times or more before being able to quit. This document explains the best ways for you to prepare to quit smoking. Quitting takes hard work and a lot of effort, but you can do it. ADVANTAGES OF QUITTING SMOKING  You will live longer, feel better, and live better.  Your body will feel the impact of quitting smoking almost immediately.  Within 20 minutes, blood pressure decreases. Your pulse returns to its normal level.  After 8 hours, carbon monoxide levels in the blood return to normal. Your oxygen level increases.  After 24 hours, the chance of having a heart attack starts to decrease. Your breath, hair, and body stop smelling like smoke.  After 48 hours, damaged nerve endings begin to recover. Your sense of taste and smell improve.  After 72 hours, the body is virtually free of nicotine. Your bronchial tubes relax and breathing becomes easier.  After 2 to 12 weeks, lungs can hold more air. Exercise becomes easier and circulation improves.  The risk of having a heart attack, stroke, cancer,   or lung disease is greatly reduced.  After 1 year, the risk of coronary heart disease is cut in half.  After 5 years, the risk of stroke falls to the same as a nonsmoker.  After 10 years, the risk of lung cancer is cut in half and the risk of other cancers decreases significantly.  After 15 years, the risk of coronary heart disease drops, usually to the level of a nonsmoker.  If you are pregnant, quitting smoking will improve your chances of having a healthy baby.  The people you live with, especially any children, will be healthier.  You will have extra money to spend on things other than cigarettes. QUESTIONS TO THINK ABOUT BEFORE ATTEMPTING TO QUIT You may want to talk about your answers with your health care  provider.  Why do you want to quit?  If you tried to quit in the past, what helped and what did not?  What will be the most difficult situations for you after you quit? How will you plan to handle them?  Who can help you through the tough times? Your family? Friends? A health care provider?  What pleasures do you get from smoking? What ways can you still get pleasure if you quit? Here are some questions to ask your health care provider:  How can you help me to be successful at quitting?  What medicine do you think would be best for me and how should I take it?  What should I do if I need more help?  What is smoking withdrawal like? How can I get information on withdrawal? GET READY  Set a quit date.  Change your environment by getting rid of all cigarettes, ashtrays, matches, and lighters in your home, car, or work. Do not let people smoke in your home.  Review your past attempts to quit. Think about what worked and what did not. GET SUPPORT AND ENCOURAGEMENT You have a better chance of being successful if you have help. You can get support in many ways.  Tell your family, friends, and coworkers that you are going to quit and need their support. Ask them not to smoke around you.  Get individual, group, or telephone counseling and support. Programs are available at local hospitals and health centers. Call your local health department for information about programs in your area.  Spiritual beliefs and practices may help some smokers quit.  Download a "quit meter" on your computer to keep track of quit statistics, such as how long you have gone without smoking, cigarettes not smoked, and money saved.  Get a self-help book about quitting smoking and staying off tobacco. LEARN NEW SKILLS AND BEHAVIORS  Distract yourself from urges to smoke. Talk to someone, go for a walk, or occupy your time with a task.  Change your normal routine. Take a different route to work. Drink tea  instead of coffee. Eat breakfast in a different place.  Reduce your stress. Take a hot bath, exercise, or read a book.  Plan something enjoyable to do every day. Reward yourself for not smoking.  Explore interactive web-based programs that specialize in helping you quit. GET MEDICINE AND USE IT CORRECTLY Medicines can help you stop smoking and decrease the urge to smoke. Combining medicine with the above behavioral methods and support can greatly increase your chances of successfully quitting smoking.  Nicotine replacement therapy helps deliver nicotine to your body without the negative effects and risks of smoking. Nicotine replacement therapy includes nicotine gum, lozenges, inhalers,   nasal sprays, and skin patches. Some may be available over-the-counter and others require a prescription.  Antidepressant medicine helps people abstain from smoking, but how this works is unknown. This medicine is available by prescription.  Nicotinic receptor partial agonist medicine simulates the effect of nicotine in your brain. This medicine is available by prescription. Ask your health care provider for advice about which medicines to use and how to use them based on your health history. Your health care provider will tell you what side effects to look out for if you choose to be on a medicine or therapy. Carefully read the information on the package. Do not use any other product containing nicotine while using a nicotine replacement product.  RELAPSE OR DIFFICULT SITUATIONS Most relapses occur within the first 3 months after quitting. Do not be discouraged if you start smoking again. Remember, most people try several times before finally quitting. You may have symptoms of withdrawal because your body is used to nicotine. You may crave cigarettes, be irritable, feel very hungry, cough often, get headaches, or have difficulty concentrating. The withdrawal symptoms are only temporary. They are strongest when you  first quit, but they will go away within 10-14 days. To reduce the chances of relapse, try to:  Avoid drinking alcohol. Drinking lowers your chances of successfully quitting.  Reduce the amount of caffeine you consume. Once you quit smoking, the amount of caffeine in your body increases and can give you symptoms, such as a rapid heartbeat, sweating, and anxiety.  Avoid smokers because they can make you want to smoke.  Do not let weight gain distract you. Many smokers will gain weight when they quit, usually less than 10 pounds. Eat a healthy diet and stay active. You can always lose the weight gained after you quit.  Find ways to improve your mood other than smoking. FOR MORE INFORMATION  www.smokefree.gov  Document Released: 09/10/2001 Document Revised: 01/31/2014 Document Reviewed: 12/26/2011 ExitCare Patient Information 2015 [employer]. This information is not intended to replace advice given to you by your health care provider. Make sure you discuss any questions you have with your health care provider.  

## 2015-06-13 NOTE — Progress Notes (Signed)
Filed Vitals:   06/13/15 1220 06/13/15 1224  BP: 208/64 201/64  Pulse: 61 61  Temp: 97 F (36.1 C)   Resp: 16   Height: 5\' 7"  (1.702 m)   Weight: 115 lb (52.164 kg)   SpO2: 95%

## 2015-06-13 NOTE — Discharge Instructions (Signed)
How to Take Your Blood Pressure HOW DO I GET A BLOOD PRESSURE MACHINE?  You can buy an electronic home blood pressure machine at your local pharmacy. Insurance will sometimes cover the cost if you have a prescription.  Ask your doctor what type of machine is best for you. There are different machines for your arm and your wrist.  If you decide to buy a machine to check your blood pressure on your arm, first check the size of your arm so you can buy the right size cuff. To check the size of your arm:   Use a measuring tape that shows both inches and centimeters.   Wrap the measuring tape around the upper-middle part of your arm. You may need someone to help you measure.   Write down your arm measurement in both inches and centimeters.   To measure your blood pressure correctly, it is important to have the right size cuff.   If your arm is up to 13 inches (up to 34 centimeters), get an adult cuff size.  If your arm is 13 to 17 inches (35 to 44 centimeters), get a large adult cuff size.    If your arm is 17 to 20 inches (45 to 52 centimeters), get an adult thigh cuff.  WHAT DO THE NUMBERS MEAN?   There are two numbers that make up your blood pressure. For example: 120/80.  The first number (120 in our example) is called the "systolic pressure." It is a measure of the pressure in your blood vessels when your heart is pumping blood.  The second number (80 in our example) is called the "diastolic pressure." It is a measure of the pressure in your blood vessels when your heart is resting between beats.  Your doctor will tell you what your blood pressure should be. WHAT SHOULD I DO BEFORE I CHECK MY BLOOD PRESSURE?   Try to rest or relax for at least 30 minutes before you check your blood pressure.  Do not smoke.  Do not have any drinks with caffeine, such as:  Soda.  Coffee.  Tea.  Check your blood pressure in a quiet room.  Sit down and stretch out your arm on a table.  Keep your arm at about the level of your heart. Let your arm relax.  Make sure that your legs are not crossed. HOW DO I CHECK MY BLOOD PRESSURE?  Follow the directions that came with your machine.  Make sure you remove any tight-fighting clothing from your arm or wrist. Wrap the cuff around your upper arm or wrist. You should be able to fit a finger between the cuff and your arm. If you cannot fit a finger between the cuff and your arm, it is too tight and should be removed and rewrapped.  Some units require you to manually pump up the arm cuff.  Automatic units inflate the cuff when you press a button.  Cuff deflation is automatic in both models.  After the cuff is inflated, the unit measures your blood pressure and pulse. The readings are shown on a monitor. Hold still and breathe normally while the cuff is inflated.  Getting a reading takes less than a minute.  Some models store readings in a memory. Some provide a printout of readings. If your machine does not store your readings, keep a written record.  Take readings with you to your next visit with your doctor. Document Released: 08/29/2008 Document Revised: 01/31/2014 Document Reviewed: 11/11/2013 ExitCare Patient Information   2015 [employer]. This information is not intended to replace advice given to you by your health care provider. Make sure you discuss any questions you have with your health care provider.  
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
## 2015-06-14 DIAGNOSIS — S0501XA Injury of conjunctiva and corneal abrasion without foreign body, right eye, initial encounter: Secondary | ICD-10-CM | POA: Diagnosis not present

## 2015-06-19 DIAGNOSIS — S0501XA Injury of conjunctiva and corneal abrasion without foreign body, right eye, initial encounter: Secondary | ICD-10-CM | POA: Diagnosis not present

## 2015-06-22 ENCOUNTER — Ambulatory Visit: Payer: Medicare Other | Admitting: Internal Medicine

## 2015-06-28 ENCOUNTER — Encounter: Payer: Self-pay | Admitting: Internal Medicine

## 2015-06-28 ENCOUNTER — Ambulatory Visit (INDEPENDENT_AMBULATORY_CARE_PROVIDER_SITE_OTHER): Payer: Medicare Other | Admitting: Internal Medicine

## 2015-06-28 VITALS — BP 197/54 | HR 65 | Temp 97.7°F | Ht 67.0 in | Wt 121.0 lb

## 2015-06-28 DIAGNOSIS — I5030 Unspecified diastolic (congestive) heart failure: Secondary | ICD-10-CM | POA: Diagnosis not present

## 2015-06-28 DIAGNOSIS — I739 Peripheral vascular disease, unspecified: Secondary | ICD-10-CM

## 2015-06-28 DIAGNOSIS — E890 Postprocedural hypothyroidism: Secondary | ICD-10-CM | POA: Diagnosis not present

## 2015-06-28 DIAGNOSIS — F17210 Nicotine dependence, cigarettes, uncomplicated: Secondary | ICD-10-CM

## 2015-06-28 DIAGNOSIS — I10 Essential (primary) hypertension: Secondary | ICD-10-CM | POA: Diagnosis not present

## 2015-06-28 DIAGNOSIS — Z720 Tobacco use: Secondary | ICD-10-CM

## 2015-06-28 DIAGNOSIS — I5031 Acute diastolic (congestive) heart failure: Secondary | ICD-10-CM

## 2015-06-28 MED ORDER — ATORVASTATIN CALCIUM 20 MG PO TABS: 20.0000 mg | ORAL_TABLET | Freq: Every day | ORAL | Status: DC

## 2015-06-28 MED ORDER — VARENICLINE TARTRATE 0.5 MG X 11 & 1 MG X 42 PO MISC: ORAL | Status: DC

## 2015-06-28 NOTE — Assessment & Plan Note (Signed)
LAst ECHO- 11/2014- Ef- [id number], grade 2 diastolic dysfunction. She appear euvolemic today. She has been taking her lasix 20mg  daily. She denies dyspnea, has some mild chronic leg swelling. Weight is up but she is recovering from hyperthyroidism, for which she had an ablation and is now on daily synthroid.   Plan- cont lasix 20mg  daily. - Bmet today

## 2015-06-28 NOTE — Assessment & Plan Note (Addendum)
Pt ready to quit smoking, Chantix prescribed previously- June, but pt says she has not gotten it. She has no hx of seizure dsd, and she ihas no depression, suicidal ideation or psychiatric hx. She ha snever quit smoking for more than a day and a half before.   Plan- Will prescribe Chantix - Follow up in one month for Smoking cessation, and BP control. - instructions for use- Days 1 to 3: 0.5 mg once daily, Days 4 to 7: 0.5 mg twice daily, Maintenance (? Day 8): 1 mg twice daily for 11 weeks; may consider a temporary or permanent dose reduction if usual dose is not tolerated. - Prescription printed and faxed.

## 2015-06-28 NOTE — Patient Instructions (Signed)
We want you to increase the dose of the blood pressure medication you are taking to two times the usual dose. So take 2 tablets everyday.  We will send in the prescription for the Smoking medication called Chantrix.  Please continue with your synthroid- 50mg  once day.

## 2015-06-28 NOTE — Assessment & Plan Note (Signed)
BP Readings from Last 3 Encounters:  06/28/15 197/54  06/13/15 225/49  06/13/15 201/64    Lab Results  Component Value Date   NA 140 04/13/2015   K 4.2 04/13/2015   CREATININE 0.96 04/13/2015    Assessment: Blood pressure control:  Uncontrolled Progress toward BP goal:   Not at goal Comments: Now complaint with Lisinopril-HCTZ- 20-12.5mg  daily  Plan: Medications:  Will increase meds to lisinopril-HCTZ- 40-25mg  daily. Other plans: Bmet today - Review BP log next visit - See in 4 weeks.

## 2015-06-28 NOTE — Progress Notes (Signed)
[name], female   DOB: 1929-02-07, 81 y.o.   MRN: [id number]   Subjective:   [name] female   DOB: 04-15-1929 81 y.o.   MRN: [id number]  HPI: [name] is a 81 y.o. with PMH listed below. Presented today for routine follow up visit. Please see assessment and plan for status on pts chronic medical conditions.   Past Medical History  Diagnosis Date  . Intraductal carcinoma 06/2003    Of right breast. s/p right partial mastectomy. // Followed by Dr. Marylene Buerger  . PVD (peripheral vascular disease)     S/P BL femoral-popliteal bypass surgery 03/2001 (right) and 01/2003 (left)  . Diabetes mellitus, type 2     Well controlled  . Hypertension   . Hyperlipidemia   . TB lung, latent     Treated with INH in 11/2007  . Arthritis     lt hip  . Thyroid disease     Graves disease  . Ulcer     gastric antral ulcer and AVMs  . Mesenteric ischemia   . Graves disease     HX OF GRAVES  . Anemia 11/30/2014  . Acute diastolic congestive heart failure 01/28/2013  . Transfusion history     last admission 11-30-14   Current Outpatient Prescriptions  Medication Sig Dispense Refill  . aspirin EC 81 MG tablet Take 81 mg by mouth daily.    Marland Kitchen atorvastatin (LIPITOR) 10 MG tablet Take 1 tablet (10 mg total) by mouth daily at 6 PM. 90 tablet 2  . calcium citrate-vitamin D (CITRACAL+D) 315-200 MG-UNIT per tablet Take 2 tablets by mouth 2 (two) times daily. 120 tablet 11  . cholecalciferol (VITAMIN D) 1000 UNITS tablet Take 1,000 Units by mouth daily.    . diclofenac sodium (VOLTAREN) 1 % GEL Apply 2 g topically 4 (four) times daily. (Patient taking differently: Apply 2 g topically as needed (for muscle pain). ) 1 Tube 0  . Docusate Sodium (DSS) 100 MG CAPS Take 100 mg by mouth daily. 60 each 0  . dorzolamide-timolol (COSOPT) 22.3-6.8 MG/ML ophthalmic solution Place 1 drop into both eyes 2 (two) times daily.     . feeding supplement, RESOURCE BREEZE, (RESOURCE  BREEZE) LIQD Take 1 Container by mouth 2 (two) times daily between meals.  0  . ferrous sulfate 325 (65 FE) MG tablet Take 1 tablet (325 mg total) by mouth 3 (three) times daily with meals. 90 tablet 3  . furosemide (LASIX) 20 MG tablet Take 1 tablet (20 mg total) by mouth daily. 30 tablet 0  . levothyroxine (SYNTHROID, LEVOTHROID) 50 MCG tablet Take 1.5 tablets (75 mcg total) by mouth daily. 60 tablet 2  . levothyroxine (SYNTHROID, LEVOTHROID) 50 MCG tablet TAKE 1 TABLET BY MOUTH EVERY DAY (Patient not taking: Reported on 06/13/2015) 90 tablet 0  . lisinopril-hydrochlorothiazide (PRINZIDE,ZESTORETIC) 20-12.5 MG per tablet Take 1 tablet by mouth daily. 90 tablet 0  . Multiple Vitamin (MULTIVITAMIN WITH MINERALS) TABS tablet Take 1 tablet by mouth daily.    . pantoprazole (PROTONIX) 40 MG tablet Take 40 mg by mouth daily.    . varenicline (CHANTIX PAK) 0.5 MG X 11 & 1 MG X 42 tablet Take one 0.5 mg tablet by mouth once daily for 3 days, then increase to one 0.5 mg tablet twice daily for 4 days, then increase to one 1 mg tablet twice daily. (Patient not taking: Reported on 06/13/2015) 53 tablet 0   No current facility-administered  medications for this visit.   Family History  Problem Relation Age of Onset  . Cancer Daughter 46    uterine cancer  . Cancer Father    Social History   Social History  . Marital Status: Widowed    Spouse Name: N/A  . Number of Children: N/A  . Years of Education: N/A   Occupational History  . nurse     Private duty nurse   Social History Main Topics  . Smoking status: Light Tobacco Smoker -- 0.20 packs/day for 64 years    Types: Cigarettes  . Smokeless tobacco: Never Used     Comment: started back smoking but cutting back.  . Alcohol Use: No  . Drug Use: No  . Sexual Activity: Not Asked   Other Topics Concern  . None   Social History Narrative   Lives alone with home health aid Monday-Friday   Review of Systems: CONSTITUTIONAL- No Fever,  weightloss, or change in appetite, weight actually increasing. SKIN- No Rash, colour changes or itching. HEAD- No Headache or dizziness. Mouth/throat- No Sorethroat, dentures, or bleeding gums. RESPIRATORY- No Cough or SOB. CARDIAC- No Palpitations, or chest pain. GI- No vomiting, diarrhoea, abd pain. URINARY- No Frequency, or dysuria. NEUROLOGIC- No Numbness or seizures.Marland Kitchen [facility]- Denies depression or anxiety.  Objective:  Physical Exam: Filed Vitals:   06/28/15 1112  BP: 197/54  Pulse: 65  Temp: 97.7 F (36.5 C)  TempSrc: Oral  Height: 5\' 7"  (1.702 m)  Weight: 121 lb (54.885 kg)  SpO2: 100%   GENERAL- alert, co-operative, appears as stated age, not in any distress. HEENT- Atraumatic, normocephalic, PERRL, oral mucosa appears moist CARDIAC- Regular, rubs or gallops. RESP- Moving equal volumes of air,  no wheezes or crackles. ABDOMEN- Soft, nontender, bowel sounds present. NEURO- Alert and oriented, Gait- Normal. EXTREMITIES- Warm and well perfused, no pedal edema. SKIN- Warm, dry, No rash or lesion. PSYCH- Normal mood and affect, appropriate thought content and speech.  Assessment & Plan:  The patient's case and plan of care was discussed with attending physician, Dr. Lynnae January.  Please see problem based charting for assessment and plan.

## 2015-06-28 NOTE — Assessment & Plan Note (Signed)
She is supposed to be on a daily aspirin- baby dose. She has a hx of GI bleed, - AVMs per endoscopy in 2014.  Still with anemia. Will hold of on starting aspirin, for now, will recheck CBC next visit. Last check 03/2015- 8.5, on daily iron.  Plan- CBC next visit - revisit issue of aspirin - Will increase Lipitor to 20mg  for now, if she tolerates can consider gradually increasing to 40mg  daily , considering pt has PVD s/p bypass and DM- diet controlled. But would watch for intolerance as her weight is 121lbs, BMI- 18.95.

## 2015-06-28 NOTE — Assessment & Plan Note (Addendum)
Todays note- 06/28/2015-   BP Readings from Last 3 Encounters:  06/28/15 197/54  06/13/15 225/49  06/13/15 201/64    Lab Results  Component Value Date   NA 140 04/13/2015   K 4.2 04/13/2015   CREATININE 0.96 04/13/2015    Assessment: Blood pressure control: Uncontrolled Progress toward BP goal: Not at goal Comments: Now complaint with Lisinopril-HCTZ- 20-12.45m daily  Plan: Medications: Will increase meds to lisinopril-HCTZ- 40-273mdaily. Other plans: Bmet today - Review BP log next visit - See in 4 weeks.                Note By Dr JaMichail Jewels/08/2015-   BP today 177/38. Reports compliance with all meds but had Dr. KiMaudie Mercuryun refill list and has not picked up lasix since May? Pharmacy met with him and pt [facility].  -referral to Dr KiMaudie Mercuryor med compliance issues--> met with pt today (see above) -no reason to recheck BMP today given not taking lasix    Note by Dr ViZada Finders 04/13/2015-   BP today is 163/42. Previous two readings: 130/62, 144/46. She is taking Lasix 20 mg once daily and Lisinopril-HCTZ 20-12.5 mg once daily. She admits that she was not taking her Lasix as directed for a while, but is now taking as instructed. She states that she has frequent urination and has to rush to the bathroom at times. Her last potassium was 3.5 on 03/07/2015.  Will check BMP now that she is taking Lasix regularly to assess potassium level.

## 2015-06-28 NOTE — Assessment & Plan Note (Addendum)
Pt says she has been taking her synthroid, she is on 50mg  daily now. She was seeing an endocrinologist previously.   Plan- TSH today - Pending results call patient and adjust meds.  Addendum- TSH markedly elevated at 76.23, has been on 50mg  daily of synthroid, will increase dose of synthroid to 75

## 2015-06-29 ENCOUNTER — Telehealth: Payer: Self-pay | Admitting: Pharmacist

## 2015-06-29 DIAGNOSIS — R0600 Dyspnea, unspecified: Secondary | ICD-10-CM

## 2015-06-29 DIAGNOSIS — R600 Localized edema: Secondary | ICD-10-CM

## 2015-06-29 LAB — BMP8+ANION GAP
Anion Gap: 15 mmol/L (ref 10.0–18.0)
BUN/Creatinine Ratio: 24 (ref 11–26)
BUN: 19 mg/dL (ref 8–27)
CO2: 24 mmol/L (ref 18–29)
Calcium: 8.8 mg/dL (ref 8.7–10.3)
Chloride: 102 mmol/L (ref 97–108)
Creatinine, Ser: 0.79 mg/dL (ref 0.57–1.00)
GFR calc Af Amer: 80 mL/min/{1.73_m2} (ref >59)
GFR calc non Af Amer: 69 mL/min/{1.73_m2} (ref >59)
Glucose: 103 mg/dL — ABNORMAL HIGH (ref 65–99)
Potassium: 4.3 mmol/L (ref 3.5–5.2)
Sodium: 141 mmol/L (ref 134–144)

## 2015-06-29 LAB — LIPID PANEL
Chol/HDL Ratio: 2.3 ratio units (ref 0.0–4.4)
Cholesterol, Total: 205 mg/dL — ABNORMAL HIGH (ref 100–199)
HDL: 89 mg/dL (ref >39)
LDL Calculated: 91 mg/dL (ref 0–99)
Triglycerides: 126 mg/dL (ref 0–149)
VLDL Cholesterol Cal: 25 mg/dL (ref 5–40)

## 2015-06-29 LAB — TSH: TSH: 76.23 u[IU]/mL — ABNORMAL HIGH (ref 0.450–4.500)

## 2015-06-29 NOTE — Progress Notes (Signed)
Internal Medicine Clinic Attending  Case discussed with Dr. Emokpae soon after the resident saw the patient.  We reviewed the resident's history and exam and pertinent patient test results.  I agree with the assessment, diagnosis, and plan of care documented in the resident's note. 

## 2015-06-30 ENCOUNTER — Other Ambulatory Visit: Payer: Self-pay | Admitting: Internal Medicine

## 2015-06-30 DIAGNOSIS — Z720 Tobacco use: Secondary | ICD-10-CM

## 2015-06-30 MED ORDER — LISINOPRIL-HYDROCHLOROTHIAZIDE 20-12.5 MG PO TABS: 2.0000 | ORAL_TABLET | Freq: Every day | ORAL | Status: DC

## 2015-06-30 MED ORDER — LEVOTHYROXINE SODIUM 75 MCG PO TABS: 75.0000 ug | ORAL_TABLET | Freq: Every day | ORAL | Status: DC

## 2015-06-30 MED ORDER — VARENICLINE TARTRATE 0.5 MG X 11 & 1 MG X 42 PO MISC: ORAL | Status: DC

## 2015-07-18 ENCOUNTER — Ambulatory Visit: Payer: Medicare Other | Admitting: Vascular Surgery

## 2015-07-19 ENCOUNTER — Other Ambulatory Visit: Payer: Self-pay | Admitting: Internal Medicine

## 2015-07-19 DIAGNOSIS — R0600 Dyspnea, unspecified: Secondary | ICD-10-CM

## 2015-07-19 DIAGNOSIS — R600 Localized edema: Secondary | ICD-10-CM

## 2015-07-19 MED ORDER — FUROSEMIDE 20 MG PO TABS: 20.0000 mg | ORAL_TABLET | Freq: Every day | ORAL | Status: DC

## 2015-07-19 NOTE — Telephone Encounter (Signed)
Assistance with hypertension medication management. Patient has not been taking furosemide due to running out of refills. PCP renewed the Rx. Will try to schedule patient with me for medication review. May need additional medication such as amlodipine for BP lowering.

## 2015-07-20 ENCOUNTER — Telehealth: Payer: Self-pay | Admitting: Internal Medicine

## 2015-07-20 NOTE — Telephone Encounter (Signed)
Called Dr. Zenia Resides office.  Patient's LOV 06/17/2015 Pt due to come back within 2 months.  Notes being faxed.

## 2015-08-04 ENCOUNTER — Encounter: Payer: Self-pay | Admitting: Vascular Surgery

## 2015-08-08 ENCOUNTER — Encounter: Payer: Self-pay | Admitting: Vascular Surgery

## 2015-08-08 ENCOUNTER — Ambulatory Visit (INDEPENDENT_AMBULATORY_CARE_PROVIDER_SITE_OTHER): Payer: Medicare Other | Admitting: Vascular Surgery

## 2015-08-08 VITALS — BP 202/64 | HR 53 | Temp 97.8°F | Resp 14 | Ht 67.0 in | Wt 120.0 lb

## 2015-08-08 DIAGNOSIS — I739 Peripheral vascular disease, unspecified: Secondary | ICD-10-CM | POA: Diagnosis not present

## 2015-08-08 NOTE — Progress Notes (Signed)
Filed Vitals:   08/08/15 1015 08/08/15 1022  BP: 209/63 202/64  Pulse: 53 53  Temp: 97.8 F (36.6 C)   TempSrc: Oral   Resp: 14   Height: 5\' 7"  (1.702 m)   Weight: 120 lb (54.432 kg)   SpO2: 100%

## 2015-08-08 NOTE — Addendum Note (Signed)
Addended by: Dorthula Rue L on: 08/08/2015 11:08 AM   Modules accepted: Orders

## 2015-08-08 NOTE — Progress Notes (Signed)
Subjective:     [name], female   DOB: 19-Mar-1929, 81 y.o.   MRN: [id number]  HPI This 81 year old female returns for continued follow-up regarding her bilateral femoral-popliteal Gore-Tex grafts which I inserted into those and into in 2008. She had stenosis -90% in the distal aspect of the left femoral-popliteal graft. This was treated with angioplasty by Dr. Trula Slade in June 2016. Patient has chronic neuropathy in both feet. She does not appreciate any  Difference in her symptoms. She had duplex scan performed last month which revealed 50-70% stenosis in the distal end of the left femoral-popliteal graft with the angioplasty was performed. She does have chronic numbness in both feet.  Past Medical History  Diagnosis Date  . Intraductal carcinoma 06/2003    Of right breast. s/p right partial mastectomy. // Followed by Dr. Marylene Buerger  . PVD (peripheral vascular disease) ([place])     S/P BL femoral-popliteal bypass surgery 03/2001 (right) and 01/2003 (left)  . Diabetes mellitus, type 2 ([place])     Well controlled  . Hypertension   . Hyperlipidemia   . TB lung, latent     Treated with INH in 11/2007  . Arthritis     lt hip  . Thyroid disease     Graves disease  . Ulcer     gastric antral ulcer and AVMs  . Mesenteric ischemia ([place])   . Graves disease     HX OF GRAVES  . Anemia 11/30/2014  . Acute diastolic congestive heart failure ([place]) 01/28/2013  . Transfusion history     last admission 11-30-14    Social History  Substance Use Topics  . Smoking status: Former Smoker -- 0.20 packs/day for 64 years    Types: Cigarettes    Quit date: 07/09/2015  . Smokeless tobacco: Never Used     Comment: started back smoking but cutting back.  . Alcohol Use: No    Family History  Problem Relation Age of Onset  . Cancer Daughter 57    uterine cancer  . Cancer Father     No Active Allergies   Current outpatient prescriptions:  .  aspirin EC 81 MG tablet, Take 81 mg by  mouth daily., Disp: , Rfl:  .  atorvastatin (LIPITOR) 20 MG tablet, Take 1 tablet (20 mg total) by mouth daily at 6 PM., Disp: 90 tablet, Rfl: 0 .  calcium citrate-vitamin D (CITRACAL+D) 315-200 MG-UNIT per tablet, Take 2 tablets by mouth 2 (two) times daily., Disp: 120 tablet, Rfl: 11 .  cholecalciferol (VITAMIN D) 1000 UNITS tablet, Take 1,000 Units by mouth daily., Disp: , Rfl:  .  diclofenac sodium (VOLTAREN) 1 % GEL, Apply 2 g topically 4 (four) times daily. (Patient taking differently: Apply 2 g topically as needed (for muscle pain). ), Disp: 1 Tube, Rfl: 0 .  Docusate Sodium (DSS) 100 MG CAPS, Take 100 mg by mouth daily., Disp: 60 each, Rfl: 0 .  dorzolamide-timolol (COSOPT) 22.3-6.8 MG/ML ophthalmic solution, Place 1 drop into both eyes 2 (two) times daily. , Disp: , Rfl:  .  feeding supplement, RESOURCE BREEZE, (RESOURCE BREEZE) LIQD, Take 1 Container by mouth 2 (two) times daily between meals., Disp: , Rfl: 0 .  ferrous sulfate 325 (65 FE) MG tablet, Take 1 tablet (325 mg total) by mouth 3 (three) times daily with meals., Disp: 90 tablet, Rfl: 3 .  furosemide (LASIX) 20 MG tablet, Take 1 tablet (20 mg total) by mouth daily., Disp: 30 tablet, Rfl: 2 .  levothyroxine (SYNTHROID, LEVOTHROID) 75 MCG tablet, Take 1 tablet (75 mcg total) by mouth daily., Disp: 30 tablet, Rfl: 1 .  lisinopril-hydrochlorothiazide (PRINZIDE,ZESTORETIC) 20-12.5 MG tablet, Take 2 tablets by mouth daily., Disp: 60 tablet, Rfl: 1 .  Multiple Vitamin (MULTIVITAMIN WITH MINERALS) TABS tablet, Take 1 tablet by mouth daily., Disp: , Rfl:  .  pantoprazole (PROTONIX) 40 MG tablet, Take 40 mg by mouth daily., Disp: , Rfl:  .  varenicline (CHANTIX PAK) 0.5 MG X 11 & 1 MG X 42 tablet, Take one 0.5 mg tablet by mouth once daily for 3 days, then increase to one 0.5 mg tablet twice daily for 4 days, then increase to one 1 mg tablet twice daily., Disp: 53 tablet, Rfl: 0  Filed Vitals:   08/08/15 1015 08/08/15 1022  BP: 209/63  202/64  Pulse: 53 53  Temp: 97.8 F (36.6 C)   TempSrc: Oral   Resp: 14   Height: 5\' 7"  (1.702 m)   Weight: 120 lb (54.432 kg)   SpO2: 100%     Body mass index is 18.79 kg/(m^2).           Review of Systems Denies chest pain, dyspnea on exertion, PND, orthopnea, hemoptysis     Objective:   Physical Exam BP 202/64 mmHg  Pulse 53  Temp(Src) 97.8 F (36.6 C) (Oral)  Resp 14  Ht 5\' 7"  (1.702 m)  Wt 120 lb (54.432 kg)  BMI 18.79 kg/m2  SpO2 100%   general elderly female in no apparent distress alert and oriented 3 Lungs no rhonchi or wheezing Both legs were 3+ femoral pulses palpable. 1-2+ left popliteal pulse palpable in the right leg has 1+ popliteal pulse palpable. Both feet are free of ischemic changes with no palpable pulses.     Assessment:      evidence of mild-to-moderate stenosis distal end of left femoral-popliteal Gore-Tex graft - 5 months post angioplasty by Dr. Trula Slade of 90% stenosis     Plan:      return in 3 months with repeat ABIs and duplex scan bilateral femoral-poplitel grafts to see if evidence of any progressive stenosis paticularlyon the lft. Resue daily aspirin 81  mg

## 2015-09-12 ENCOUNTER — Encounter: Payer: Self-pay | Admitting: Internal Medicine

## 2015-09-12 ENCOUNTER — Encounter: Payer: Medicare Other | Admitting: Internal Medicine

## 2015-09-15 ENCOUNTER — Other Ambulatory Visit: Payer: Self-pay | Admitting: Internal Medicine

## 2015-09-27 ENCOUNTER — Other Ambulatory Visit (HOSPITAL_COMMUNITY): Payer: Medicare Other

## 2015-09-27 ENCOUNTER — Encounter (HOSPITAL_COMMUNITY): Payer: Medicare Other

## 2015-09-27 ENCOUNTER — Ambulatory Visit: Payer: Medicare Other | Admitting: Family

## 2015-11-06 ENCOUNTER — Other Ambulatory Visit: Payer: Self-pay | Admitting: Internal Medicine

## 2015-11-13 ENCOUNTER — Ambulatory Visit: Payer: Medicare Other | Admitting: Internal Medicine

## 2015-11-17 ENCOUNTER — Encounter: Payer: Self-pay | Admitting: Family

## 2015-11-23 ENCOUNTER — Ambulatory Visit (HOSPITAL_COMMUNITY)
Admission: RE | Admit: 2015-11-23 | Discharge: 2015-11-23 | Disposition: A | Discharge status: Home / Self Care | Payer: Medicare Other | Source: Ambulatory Visit | Attending: Family | Admitting: Family

## 2015-11-23 ENCOUNTER — Encounter: Payer: Self-pay | Admitting: Family

## 2015-11-23 ENCOUNTER — Ambulatory Visit (INDEPENDENT_AMBULATORY_CARE_PROVIDER_SITE_OTHER)
Admission: RE | Admit: 2015-11-23 | Discharge: 2015-11-23 | Disposition: A | Discharge status: Home / Self Care | Payer: Medicare Other | Source: Ambulatory Visit | Attending: Family | Admitting: Family

## 2015-11-23 ENCOUNTER — Ambulatory Visit (INDEPENDENT_AMBULATORY_CARE_PROVIDER_SITE_OTHER): Payer: Medicare Other | Admitting: Family

## 2015-11-23 VITALS — BP 163/58 | HR 57 | Temp 97.2°F | Resp 14 | Ht 67.0 in | Wt 110.0 lb

## 2015-11-23 DIAGNOSIS — I779 Disorder of arteries and arterioles, unspecified: Secondary | ICD-10-CM

## 2015-11-23 DIAGNOSIS — I739 Peripheral vascular disease, unspecified: Secondary | ICD-10-CM | POA: Diagnosis not present

## 2015-11-23 DIAGNOSIS — Z87891 Personal history of nicotine dependence: Secondary | ICD-10-CM | POA: Diagnosis not present

## 2015-11-23 DIAGNOSIS — Z95828 Presence of other vascular implants and grafts: Secondary | ICD-10-CM | POA: Diagnosis not present

## 2015-11-23 DIAGNOSIS — R0989 Other specified symptoms and signs involving the circulatory and respiratory systems: Secondary | ICD-10-CM

## 2015-11-23 DIAGNOSIS — Z48812 Encounter for surgical aftercare following surgery on the circulatory system: Secondary | ICD-10-CM

## 2015-11-23 DIAGNOSIS — Z4889 Encounter for other specified surgical aftercare: Secondary | ICD-10-CM

## 2015-11-23 NOTE — Patient Instructions (Addendum)
Peripheral Vascular Disease Peripheral vascular disease (PVD) is a disease of the blood vessels that are not part of your heart and brain. A simple term for PVD is poor circulation. In most cases, PVD narrows the blood vessels that carry blood from your heart to the rest of your body. This can result in a decreased supply of blood to your arms, legs, and internal organs, like your stomach or kidneys. However, it most often affects a person's lower legs and feet. There are two types of PVD.  Organic PVD. This is the more common type. It is caused by damage to the structure of blood vessels.  Functional PVD. This is caused by conditions that make blood vessels contract and tighten (spasm). Without treatment, PVD tends to get worse over time. PVD can also lead to acute ischemic limb. This is when an arm or limb suddenly has trouble getting enough blood. This is a medical emergency. CAUSES Each type of PVD has many different causes. The most common cause of PVD is buildup of a fatty material (plaque) inside of your arteries (atherosclerosis). Small amounts of plaque can break off from the walls of the blood vessels and become lodged in a smaller artery. This blocks blood flow and can cause acute ischemic limb. Other common causes of PVD include:  Blood clots that form inside of blood vessels.  Injuries to blood vessels.  Diseases that cause inflammation of blood vessels or cause blood vessel spasms.  Health behaviors and health history that increase your risk of developing PVD. RISK FACTORS  You may have a greater risk of PVD if you:  Have a family history of PVD.  Have certain medical conditions, including:  High cholesterol.  Diabetes.  High blood pressure (hypertension).  Coronary heart disease.  Past problems with blood clots.  Past injury, such as burns or a broken bone. These may have damaged blood vessels in your limbs.  Buerger disease. This is caused by inflamed blood  vessels in your hands and feet.  Some forms of arthritis.  Rare birth defects that affect the arteries in your legs.  Use tobacco.  Do not get enough exercise.  Are obese.  Are age 50 or older. SIGNS AND SYMPTOMS  PVD may cause many different symptoms. Your symptoms depend on what part of your body is not getting enough blood. Some common signs and symptoms include:  Cramps in your lower legs. This may be a symptom of poor leg circulation (claudication).  Pain and weakness in your legs while you are physically active that goes away when you rest (intermittent claudication).  Leg pain when at rest.  Leg numbness, tingling, or weakness.  Coldness in a leg or foot, especially when compared with the other leg.  Skin or hair changes. These can include:  Hair loss.  Shiny skin.  Pale or bluish skin.  Thick toenails.  Inability to get or maintain an erection (erectile dysfunction). People with PVD are more prone to developing ulcers and sores on their toes, feet, or legs. These may take longer than normal to heal. DIAGNOSIS Your health care provider may diagnose PVD from your signs and symptoms. The health care provider will also do a physical exam. You may have tests to find out what is causing your PVD and determine its severity. Tests may include:  Blood pressure recordings from your arms and legs and measurements of the strength of your pulses (pulse volume recordings).  Imaging studies using sound waves to take pictures of   the blood flow through your blood vessels (Doppler ultrasound).  Injecting a dye into your blood vessels before having imaging studies using:  X-rays (angiogram or arteriogram).  Computer-generated X-rays (CT angiogram).  A powerful electromagnetic field and a computer (magnetic resonance angiogram or MRA). TREATMENT Treatment for PVD depends on the cause of your condition and the severity of your symptoms. It also depends on your age. Underlying  causes need to be treated and controlled. These include long-lasting (chronic) conditions, such as diabetes, high cholesterol, and high blood pressure. You may need to first try making lifestyle changes and taking medicines. Surgery may be needed if these do not work. Lifestyle changes may include:  Quitting smoking.  Exercising regularly.  Following a low-fat, low-cholesterol diet. Medicines may include:  Blood thinners to prevent blood clots.  Medicines to improve blood flow.  Medicines to improve your blood cholesterol levels. Surgical procedures may include:  A procedure that uses an inflated balloon to open a blocked artery and improve blood flow (angioplasty).  A procedure to put in a tube (stent) to keep a blocked artery open (stent implant).  Surgery to reroute blood flow around a blocked artery (peripheral bypass surgery).  Surgery to remove dead tissue from an infected wound on the affected limb.  Amputation. This is surgical removal of the affected limb. This may be necessary in cases of acute ischemic limb that are not improved through medical or surgical treatments. HOME CARE INSTRUCTIONS  Take medicines only as directed by your health care provider.  Do not use any tobacco products, including cigarettes, chewing tobacco, or electronic cigarettes. If you need help quitting, ask your health care provider.  Lose weight if you are overweight, and maintain a healthy weight as directed by your health care provider.  Eat a diet that is low in fat and cholesterol. If you need help, ask your health care provider.  Exercise regularly. Ask your health care provider to suggest some good activities for you.  Use compression stockings or other mechanical devices as directed by your health care provider.  Take good care of your feet.  Wear comfortable shoes that fit well.  Check your feet often for any cuts or sores. SEEK MEDICAL CARE IF:  You have cramps in your legs  while walking.  You have leg pain when you are at rest.  You have coldness in a leg or foot.  Your skin changes.  You have erectile dysfunction.  You have cuts or sores on your feet that are not healing. SEEK IMMEDIATE MEDICAL CARE IF:  Your arm or leg turns cold and blue.  Your arms or legs become red, warm, swollen, painful, or numb.  You have chest pain or trouble breathing.  You suddenly have weakness in your face, arm, or leg.  You become very confused or lose the ability to speak.  You suddenly have a very bad headache or lose your vision.   This information is not intended to replace advice given to you by your health care provider. Make sure you discuss any questions you have with your health care provider.   Document Released: 10/24/2004 Document Revised: 10/07/2014 Document Reviewed: 02/24/2014 Elsevier Interactive Patient Education 2016 [employer]  
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
## 2015-11-23 NOTE — Progress Notes (Signed)
Filed Vitals:   11/23/15 1106 11/23/15 1113  BP: 177/55 163/58  Pulse: 59 57  Temp: 97.2 F (36.2 C)   TempSrc: Oral   Resp: 14   Height: 5\' 7"  (1.702 m)   Weight: 110 lb (49.896 kg)   SpO2: 100%

## 2015-12-05 ENCOUNTER — Telehealth: Payer: Self-pay | Admitting: Internal Medicine

## 2015-12-05 ENCOUNTER — Other Ambulatory Visit: Payer: Self-pay | Admitting: *Deleted

## 2015-12-05 NOTE — Telephone Encounter (Signed)
Pt said she does have that "congested feeling" in her throat again and doesn't know why she hasn't gotten any more Thyroid medication called in yet.  She said it needs to go to [facility] on [address]

## 2015-12-05 NOTE — Telephone Encounter (Signed)
I was not aware of any of the high TSH levels that she had. She was lost for follow-up with me and the abnormal thyroid labs were not routed to me, possibly because she decided not to come back to see me. She absolutely needs to be on thyroid medication. If she has not been on it, we need to start at 25 g daily now and advance slowly from there. Please also schedule an appointment with me in approximately 1 month if she agrees to come back.

## 2015-12-05 NOTE — Telephone Encounter (Signed)
I looked back into pt's chart to see when she had her last TSH and last appt. Pt had a TSH done with Dr Ferd Glassing office. Here is the note per Dr Gordy Levan:  Pt reports compliance with synthroid 61mcg daily but Dr. Maudie Mercury called pharmacy and has not picked up synthroid since May. We forward to [facility] to see if [place] can put meds in pill box and make sure pt is taking. Also had Dr. Maudie Mercury speak with pt and agreed to have meds delivered by [facility] to improve compliance. Please read message below as well and advise.

## 2015-12-06 NOTE — Telephone Encounter (Signed)
Called pt a couple of times and was unable to lvm. Will call again.

## 2015-12-29 ENCOUNTER — Other Ambulatory Visit: Payer: Self-pay | Admitting: Internal Medicine

## 2015-12-29 NOTE — Telephone Encounter (Signed)
Last seen 06/28/15

## 2015-12-29 NOTE — Telephone Encounter (Signed)
Last appt was 06/28/15.

## 2015-12-30 NOTE — Telephone Encounter (Signed)
Patient needs appointment within a month. She was supposed to come in 4 weeks after last visit in 06/2015. She needs her hypothyroidism addressed. Thanks.  Ejiro.

## 2016-01-01 ENCOUNTER — Telehealth: Payer: Self-pay | Admitting: Internal Medicine

## 2016-01-01 ENCOUNTER — Encounter: Payer: Self-pay | Admitting: Internal Medicine

## 2016-01-01 NOTE — Telephone Encounter (Signed)
Called patient this am, but no answer.  Both numbers on file phone just rings and no voicemail.  Will send patient a letter in the mail.

## 2016-01-01 NOTE — Telephone Encounter (Signed)
Message sent to front office to schedule an appt. 

## 2016-01-22 ENCOUNTER — Encounter (HOSPITAL_COMMUNITY): Payer: Self-pay

## 2016-01-22 ENCOUNTER — Inpatient Hospital Stay (HOSPITAL_COMMUNITY)
Admission: EM | Admit: 2016-01-22 | Discharge: 2016-01-25 | DRG: 377 | Disposition: A | Discharge status: Home Health | Payer: Medicare Other | Attending: Internal Medicine | Admitting: Internal Medicine

## 2016-01-22 ENCOUNTER — Emergency Department (HOSPITAL_COMMUNITY): Payer: Medicare Other

## 2016-01-22 DIAGNOSIS — K921 Melena: Secondary | ICD-10-CM | POA: Diagnosis present

## 2016-01-22 DIAGNOSIS — Z8049 Family history of malignant neoplasm of other genital organs: Secondary | ICD-10-CM

## 2016-01-22 DIAGNOSIS — F05 Delirium due to known physiological condition: Secondary | ICD-10-CM | POA: Diagnosis present

## 2016-01-22 DIAGNOSIS — G9349 Other encephalopathy: Secondary | ICD-10-CM | POA: Diagnosis present

## 2016-01-22 DIAGNOSIS — Z79899 Other long term (current) drug therapy: Secondary | ICD-10-CM | POA: Diagnosis not present

## 2016-01-22 DIAGNOSIS — E785 Hyperlipidemia, unspecified: Secondary | ICD-10-CM | POA: Diagnosis present

## 2016-01-22 DIAGNOSIS — N179 Acute kidney failure, unspecified: Secondary | ICD-10-CM | POA: Diagnosis present

## 2016-01-22 DIAGNOSIS — Z90710 Acquired absence of both cervix and uterus: Secondary | ICD-10-CM | POA: Diagnosis not present

## 2016-01-22 DIAGNOSIS — Q2733 Arteriovenous malformation of digestive system vessel: Secondary | ICD-10-CM

## 2016-01-22 DIAGNOSIS — Z791 Long term (current) use of non-steroidal anti-inflammatories (NSAID): Secondary | ICD-10-CM | POA: Diagnosis not present

## 2016-01-22 DIAGNOSIS — Z87891 Personal history of nicotine dependence: Secondary | ICD-10-CM | POA: Diagnosis not present

## 2016-01-22 DIAGNOSIS — I10 Essential (primary) hypertension: Secondary | ICD-10-CM | POA: Diagnosis present

## 2016-01-22 DIAGNOSIS — E1151 Type 2 diabetes mellitus with diabetic peripheral angiopathy without gangrene: Secondary | ICD-10-CM | POA: Diagnosis present

## 2016-01-22 DIAGNOSIS — E039 Hypothyroidism, unspecified: Secondary | ICD-10-CM | POA: Diagnosis present

## 2016-01-22 DIAGNOSIS — D62 Acute posthemorrhagic anemia: Secondary | ICD-10-CM | POA: Diagnosis present

## 2016-01-22 DIAGNOSIS — Z7982 Long term (current) use of aspirin: Secondary | ICD-10-CM | POA: Diagnosis not present

## 2016-01-22 DIAGNOSIS — E876 Hypokalemia: Secondary | ICD-10-CM | POA: Diagnosis present

## 2016-01-22 DIAGNOSIS — R410 Disorientation, unspecified: Secondary | ICD-10-CM

## 2016-01-22 DIAGNOSIS — I110 Hypertensive heart disease with heart failure: Secondary | ICD-10-CM | POA: Diagnosis present

## 2016-01-22 DIAGNOSIS — E871 Hypo-osmolality and hyponatremia: Secondary | ICD-10-CM | POA: Diagnosis present

## 2016-01-22 DIAGNOSIS — K922 Gastrointestinal hemorrhage, unspecified: Secondary | ICD-10-CM | POA: Diagnosis present

## 2016-01-22 DIAGNOSIS — K2971 Gastritis, unspecified, with bleeding: Secondary | ICD-10-CM | POA: Diagnosis not present

## 2016-01-22 DIAGNOSIS — R5383 Other fatigue: Secondary | ICD-10-CM

## 2016-01-22 DIAGNOSIS — J449 Chronic obstructive pulmonary disease, unspecified: Secondary | ICD-10-CM | POA: Diagnosis present

## 2016-01-22 DIAGNOSIS — E041 Nontoxic single thyroid nodule: Secondary | ICD-10-CM | POA: Diagnosis present

## 2016-01-22 DIAGNOSIS — Z8711 Personal history of peptic ulcer disease: Secondary | ICD-10-CM | POA: Diagnosis not present

## 2016-01-22 DIAGNOSIS — I5032 Chronic diastolic (congestive) heart failure: Secondary | ICD-10-CM | POA: Diagnosis present

## 2016-01-22 DIAGNOSIS — D649 Anemia, unspecified: Secondary | ICD-10-CM

## 2016-01-22 LAB — PREPARE RBC (CROSSMATCH)

## 2016-01-22 LAB — URINALYSIS, ROUTINE W REFLEX MICROSCOPIC
Bilirubin Urine: NEGATIVE
Glucose, UA: NEGATIVE mg/dL
Hgb urine dipstick: NEGATIVE
Ketones, ur: NEGATIVE mg/dL
Leukocytes, UA: NEGATIVE
Nitrite: NEGATIVE
Protein, ur: NEGATIVE mg/dL
Specific Gravity, Urine: 1.005 (ref 1.005–1.030)
pH: 5.5 (ref 5.0–8.0)

## 2016-01-22 LAB — CBC WITH DIFFERENTIAL/PLATELET
Basophils Absolute: 0 10*3/uL (ref 0.0–0.1)
Basophils Relative: 1 %
Eosinophils Absolute: 0.1 10*3/uL (ref 0.0–0.7)
Eosinophils Relative: 1 %
HCT: 19.2 % — ABNORMAL LOW (ref 36.0–46.0)
Hemoglobin: 6.1 g/dL — CL (ref 12.0–15.0)
Lymphocytes Relative: 23 %
Lymphs Abs: 1 10*3/uL (ref 0.7–4.0)
MCH: 23.3 pg — ABNORMAL LOW (ref 26.0–34.0)
MCHC: 31.8 g/dL (ref 30.0–36.0)
MCV: 73.3 fL — ABNORMAL LOW (ref 78.0–100.0)
Monocytes Absolute: 0.3 10*3/uL (ref 0.1–1.0)
Monocytes Relative: 8 %
Neutro Abs: 3 10*3/uL (ref 1.7–7.7)
Neutrophils Relative %: 68 %
Platelets: 258 10*3/uL (ref 150–400)
RBC: 2.62 MIL/uL — ABNORMAL LOW (ref 3.87–5.11)
RDW: 15.6 % — ABNORMAL HIGH (ref 11.5–15.5)
WBC: 4.3 10*3/uL (ref 4.0–10.5)

## 2016-01-22 LAB — IRON AND TIBC
Iron: 16 ug/dL — ABNORMAL LOW (ref 28–170)
Saturation Ratios: 3 % — ABNORMAL LOW (ref 10.4–31.8)
TIBC: 459 ug/dL — ABNORMAL HIGH (ref 250–450)
UIBC: 443 ug/dL

## 2016-01-22 LAB — COMPREHENSIVE METABOLIC PANEL
ALT: 13 U/L — ABNORMAL LOW (ref 14–54)
AST: 26 U/L (ref 15–41)
Albumin: 3.5 g/dL (ref 3.5–5.0)
Alkaline Phosphatase: 62 U/L (ref 38–126)
Anion gap: 14 (ref 5–15)
BUN: 23 mg/dL — ABNORMAL HIGH (ref 6–20)
CO2: 28 mmol/L (ref 22–32)
Calcium: 9 mg/dL (ref 8.9–10.3)
Chloride: 83 mmol/L — ABNORMAL LOW (ref 101–111)
Creatinine, Ser: 1.15 mg/dL — ABNORMAL HIGH (ref 0.44–1.00)
GFR calc Af Amer: 49 mL/min — ABNORMAL LOW (ref >60)
GFR calc non Af Amer: 42 mL/min — ABNORMAL LOW (ref >60)
Glucose, Bld: 113 mg/dL — ABNORMAL HIGH (ref 65–99)
Potassium: 2.8 mmol/L — ABNORMAL LOW (ref 3.5–5.1)
Sodium: 125 mmol/L — ABNORMAL LOW (ref 135–145)
Total Bilirubin: 1 mg/dL (ref 0.3–1.2)
Total Protein: 6.7 g/dL (ref 6.5–8.1)

## 2016-01-22 LAB — FERRITIN: Ferritin: 11 ng/mL (ref 11–307)

## 2016-01-22 LAB — FOLATE: Folate: 9.9 ng/mL (ref >5.9)

## 2016-01-22 LAB — RETICULOCYTES
RBC.: 2.74 MIL/uL — ABNORMAL LOW (ref 3.87–5.11)
Retic Count, Absolute: 35.6 10*3/uL (ref 19.0–186.0)
Retic Ct Pct: 1.3 % (ref 0.4–3.1)

## 2016-01-22 LAB — VITAMIN B12: Vitamin B-12: 311 pg/mL (ref 180–914)

## 2016-01-22 LAB — I-STAT TROPONIN, ED: Troponin i, poc: 0.04 ng/mL (ref 0.00–0.08)

## 2016-01-22 LAB — MAGNESIUM: Magnesium: 1.8 mg/dL (ref 1.7–2.4)

## 2016-01-22 LAB — POC OCCULT BLOOD, ED: Fecal Occult Bld: POSITIVE — AB

## 2016-01-22 MED ORDER — FUROSEMIDE 20 MG PO TABS
20.0000 mg | ORAL_TABLET | Freq: Every day | ORAL | Status: DC
  Administered 2016-01-22 – 2016-01-23 (×2): 20 mg via ORAL
  Filled 2016-01-22: qty 1

## 2016-01-22 MED ORDER — SUCRALFATE 1 GM/10ML PO SUSP
1.0000 g | Freq: Three times a day (TID) | ORAL | Status: DC
  Administered 2016-01-22 – 2016-01-25 (×9): 1 g via ORAL
  Filled 2016-01-22 (×10): qty 10

## 2016-01-22 MED ORDER — SODIUM CHLORIDE 0.9 % IV SOLN
8.0000 mg/h | INTRAVENOUS | Status: DC
  Administered 2016-01-22 – 2016-01-25 (×2): 8 mg/h via INTRAVENOUS
  Filled 2016-01-22 (×9): qty 80

## 2016-01-22 MED ORDER — ACETAMINOPHEN 325 MG PO TABS: 650.0000 mg | ORAL_TABLET | Freq: Four times a day (QID) | ORAL | Status: DC | PRN

## 2016-01-22 MED ORDER — ACETAMINOPHEN 650 MG RE SUPP: 650.0000 mg | Freq: Four times a day (QID) | RECTAL | Status: DC | PRN

## 2016-01-22 MED ORDER — DORZOLAMIDE HCL-TIMOLOL MAL 2-0.5 % OP SOLN
1.0000 [drp] | Freq: Two times a day (BID) | OPHTHALMIC | Status: DC
  Administered 2016-01-22 – 2016-01-25 (×6): 1 [drp] via OPHTHALMIC
  Filled 2016-01-22: qty 10

## 2016-01-22 MED ORDER — OXYCODONE HCL 5 MG PO TABS: 5.0000 mg | ORAL_TABLET | ORAL | Status: DC | PRN

## 2016-01-22 MED ORDER — LEVOTHYROXINE SODIUM 75 MCG PO TABS
75.0000 ug | ORAL_TABLET | Freq: Every day | ORAL | Status: DC
  Administered 2016-01-23 – 2016-01-25 (×3): 75 ug via ORAL
  Filled 2016-01-22 (×3): qty 1

## 2016-01-22 MED ORDER — SODIUM CHLORIDE 0.9 % IV SOLN
INTRAVENOUS | Status: DC
  Administered 2016-01-22 – 2016-01-24 (×3): via INTRAVENOUS

## 2016-01-22 MED ORDER — LISINOPRIL-HYDROCHLOROTHIAZIDE 20-12.5 MG PO TABS: 2.0000 | ORAL_TABLET | Freq: Every day | ORAL | Status: DC

## 2016-01-22 MED ORDER — SODIUM CHLORIDE 0.9 % IV SOLN
10.0000 mL/h | Freq: Once | INTRAVENOUS | Status: AC
  Administered 2016-01-22: 10 mL/h via INTRAVENOUS

## 2016-01-22 MED ORDER — LISINOPRIL 40 MG PO TABS
40.0000 mg | ORAL_TABLET | Freq: Every day | ORAL | Status: DC
  Administered 2016-01-23 – 2016-01-25 (×3): 40 mg via ORAL
  Filled 2016-01-22 (×3): qty 1

## 2016-01-22 MED ORDER — MAGNESIUM SULFATE 2 GM/50ML IV SOLN
2.0000 g | Freq: Once | INTRAVENOUS | Status: AC
  Administered 2016-01-22: 2 g via INTRAVENOUS
  Filled 2016-01-22: qty 50

## 2016-01-22 MED ORDER — SODIUM CHLORIDE 0.9% FLUSH
3.0000 mL | Freq: Two times a day (BID) | INTRAVENOUS | Status: DC
  Administered 2016-01-22 – 2016-01-25 (×6): 3 mL via INTRAVENOUS

## 2016-01-22 MED ORDER — PANTOPRAZOLE SODIUM 40 MG IV SOLR
40.0000 mg | Freq: Two times a day (BID) | INTRAVENOUS | Status: DC
  Filled 2016-01-22: qty 40

## 2016-01-22 MED ORDER — ATORVASTATIN CALCIUM 20 MG PO TABS
20.0000 mg | ORAL_TABLET | Freq: Every day | ORAL | Status: DC
  Administered 2016-01-22 – 2016-01-24 (×3): 20 mg via ORAL
  Filled 2016-01-22 (×2): qty 1

## 2016-01-22 MED ORDER — POTASSIUM CHLORIDE CRYS ER 20 MEQ PO TBCR
40.0000 meq | EXTENDED_RELEASE_TABLET | Freq: Two times a day (BID) | ORAL | Status: DC
  Administered 2016-01-22 – 2016-01-25 (×6): 40 meq via ORAL
  Filled 2016-01-22 (×6): qty 2

## 2016-01-22 MED ORDER — SODIUM CHLORIDE 0.9 % IV SOLN
80.0000 mg | Freq: Once | INTRAVENOUS | Status: AC
  Administered 2016-01-22: 80 mg via INTRAVENOUS
  Filled 2016-01-22: qty 80

## 2016-01-22 MED ORDER — SODIUM CHLORIDE 0.45 % IV SOLN: INTRAVENOUS | Status: DC

## 2016-01-22 MED ORDER — HYDROCHLOROTHIAZIDE 25 MG PO TABS: 25.0000 mg | ORAL_TABLET | Freq: Every day | ORAL | Status: DC

## 2016-01-22 NOTE — ED Notes (Signed)
rn on the floor aware that the blood is not ready and will need to be started on the floor

## 2016-01-22 NOTE — ED Notes (Signed)
Pt presents to er via ems from the doctors office states that she went there to talk to them about getting her aid at home back and they sent her to the er for confusion, the patient is ambulatory and alert and oriented with no complaints
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
## 2016-01-22 NOTE — Care Management Note (Signed)
Case Management Note  Patient Details  Name: Gina [name] MRN: [id number] Date of Birth: [birth year]  Subjective/Objective:         Admitted with fatigue, hyponatremia. Hgb= 9.0. Lives alone. Independent with ADL's. Uses a cane with ambulation.           Action/Plan: Return to home when medically stable. CM to f/u with disposition needs.  Expected Discharge Date:                  Expected Discharge Plan:  Home/Self Care  In-House Referral:     Discharge planning Services  CM Consult  Post Acute Care Choice:    Choice offered to:     DME Arranged:    DME Agency:     HH Arranged:    HH Agency:     Status of Service:  In process, will continue to follow  Medicare Important Message Given:    Date Medicare IM Given:    Medicare IM give by:    Date Additional Medicare IM Given:    Additional Medicare Important Message give by:     If discussed at [place] of Stay Meetings, dates discussed:    Additional Comments: [name] (Son), truck driver,  [phone]  Whitman Hero [place], [place] [phone] 01/22/2016, 5:19 PM

## 2016-01-22 NOTE — Progress Notes (Signed)
UR COMPLETED  

## 2016-01-23 DIAGNOSIS — K2971 Gastritis, unspecified, with bleeding: Secondary | ICD-10-CM

## 2016-01-23 DIAGNOSIS — E871 Hypo-osmolality and hyponatremia: Secondary | ICD-10-CM

## 2016-01-23 DIAGNOSIS — E876 Hypokalemia: Secondary | ICD-10-CM

## 2016-01-23 DIAGNOSIS — I5032 Chronic diastolic (congestive) heart failure: Secondary | ICD-10-CM

## 2016-01-23 DIAGNOSIS — D62 Acute posthemorrhagic anemia: Secondary | ICD-10-CM

## 2016-01-23 LAB — CBC
HCT: 28.7 % — ABNORMAL LOW (ref 36.0–46.0)
Hemoglobin: 9.6 g/dL — ABNORMAL LOW (ref 12.0–15.0)
MCH: 26.3 pg (ref 26.0–34.0)
MCHC: 33.4 g/dL (ref 30.0–36.0)
MCV: 78.6 fL (ref 78.0–100.0)
Platelets: 235 10*3/uL (ref 150–400)
RBC: 3.65 MIL/uL — ABNORMAL LOW (ref 3.87–5.11)
RDW: 15.6 % — ABNORMAL HIGH (ref 11.5–15.5)
WBC: 5.3 10*3/uL (ref 4.0–10.5)

## 2016-01-23 LAB — BASIC METABOLIC PANEL
Anion gap: 11 (ref 5–15)
BUN: 21 mg/dL — ABNORMAL HIGH (ref 6–20)
CO2: 28 mmol/L (ref 22–32)
Calcium: 8.7 mg/dL — ABNORMAL LOW (ref 8.9–10.3)
Chloride: 89 mmol/L — ABNORMAL LOW (ref 101–111)
Creatinine, Ser: 1.09 mg/dL — ABNORMAL HIGH (ref 0.44–1.00)
GFR calc Af Amer: 52 mL/min — ABNORMAL LOW (ref >60)
GFR calc non Af Amer: 45 mL/min — ABNORMAL LOW (ref >60)
Glucose, Bld: 89 mg/dL (ref 65–99)
Potassium: 3.1 mmol/L — ABNORMAL LOW (ref 3.5–5.1)
Sodium: 128 mmol/L — ABNORMAL LOW (ref 135–145)

## 2016-01-23 MED ORDER — SODIUM CHLORIDE 0.9 % IV SOLN: INTRAVENOUS | Status: DC

## 2016-01-23 MED ORDER — BOOST / RESOURCE BREEZE PO LIQD
1.0000 | Freq: Three times a day (TID) | ORAL | Status: DC
  Administered 2016-01-23 – 2016-01-25 (×3): 1 via ORAL
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
## 2016-01-23 NOTE — Progress Notes (Addendum)
CM received consult regarding Home health needs. CM awaiting PT evaluation, will f/u with home health needs. Whitman Hero RN,BSN,CM [phone]

## 2016-01-23 NOTE — Evaluation (Signed)
Physical Therapy Evaluation Patient Details Name: Gina [name] MRN: [id number] DOB: December 27, 1928 Today's Date: 01/23/2016   History of Present Illness  Patient is a 82 y.o. female with past medical history of proximal small bowel AVM's admitted on 4/24 for melena with acute blood loss anemia and hyponatremia.  Clinical Impression  Pt admitted with above diagnosis. Pt currently with functional limitations due to the deficits listed below (see PT Problem List). Pt strongly desires to return home alone however deferred ambulation this date due to being to "weak." Suspect pt will progress quickly to safe mod I level of function as pt supervision with std pvt to [facility].  Pt will benefit from skilled PT to increase their independence and safety with mobility to allow discharge to the venue listed below.       Follow Up Recommendations Home health PT;Supervision/Assistance - 24 hour    Equipment Recommendations  None recommended by PT    Recommendations for Other Services       Precautions / Restrictions Precautions Precautions: Fall Restrictions Weight Bearing Restrictions: No      Mobility  Bed Mobility Overal bed mobility: Needs Assistance Bed Mobility: Supine to Sit;Sit to Supine     Supine to sit: Supervision Sit to supine: Supervision   General bed mobility comments: no difficulty, mild decreased in pace  Transfers Overall transfer level: Needs assistance Equipment used: None Transfers: Sit to/from [employer] Transfers Sit to Stand: Min guard Stand pivot transfers: Min guard       General transfer comment: pt transfered to/from [facility] from bed without difficulty  Ambulation/Gait             General Gait Details: pt deferred ambulation due to "Im so weak from going to the bathroom and my blood being low"  Stairs            Wheelchair Mobility    Modified Rankin (Stroke Patients Only)       Balance Overall balance assessment: No apparent  balance deficits (not formally assessed)                                           Pertinent Vitals/Pain Pain Assessment: No/denies pain    Home Living Family/patient expects to be discharged to:: Private residence Living Arrangements: Alone Available Help at Discharge: Family;Available PRN/intermittently (reports she is getting a HHA) Type of Home: Apartment Home Access: Level entry     Home Layout: One level Home Equipment: Walker - 2 wheels;Cane - single point;Shower seat;Grab bars - tub/shower Additional Comments: pt used to have an aide but no longer does    Prior Function Level of Independence: Needs assistance   Gait / Transfers Assistance Needed: uses cane  ADL's / Homemaking Assistance Needed: indep  Comments: still drives to get groceries     Hand Dominance   Dominant Hand: Right    Extremity/Trunk Assessment   Upper Extremity Assessment: Generalized weakness           Lower Extremity Assessment: Generalized weakness      Cervical / Trunk Assessment: Normal  Communication   Communication: No difficulties  Cognition Arousal/Alertness: Awake/alert Behavior During Therapy: WFL for tasks assessed/performed Overall Cognitive Status: Within Functional Limits for tasks assessed                      General Comments  Exercises        Assessment/Plan    PT Assessment Patient needs continued PT services  PT Diagnosis Generalized weakness   PT Problem List Decreased strength;Decreased activity tolerance;Decreased balance;Decreased mobility;Decreased coordination  PT Treatment Interventions DME instruction;Gait training;Functional mobility training;Therapeutic activities;Therapeutic exercise   PT Goals (Current goals can be found in the Care Plan section) Acute Rehab PT Goals Patient Stated Goal: home today PT Goal Formulation: With patient Time For Goal Achievement: 01/30/16 Potential to Achieve Goals:  Good Additional Goals Additional Goal #1: Pt to score >19 on DGI to indicate minimal falls risk.    Frequency Min 3X/week   Barriers to discharge Decreased caregiver support lives alone    Co-evaluation               End of Session Equipment Utilized During Treatment: Gait belt Activity Tolerance: Patient tolerated treatment well Patient left: in bed;with call bell/phone within reach;with bed alarm set Nurse Communication: Mobility status         Time: [id number] PT Time Calculation (min) (ACUTE ONLY): 15 min   Charges:   PT Evaluation $PT Eval Low Complexity: 1 Procedure     PT G CodesKingsley Callander 01/23/2016, 4:26 PM   Kittie Plater, PT, DPT Pager #: [phone] Office #: [phone]

## 2016-01-23 NOTE — Progress Notes (Signed)
Initial Nutrition Assessment  DOCUMENTATION CODES:   Severe malnutrition in context of chronic illness  INTERVENTION:   -Boost Breeze po TID, each supplement provides 250 kcal and 9 grams of protein  NUTRITION DIAGNOSIS:   Malnutrition related to chronic illness as evidenced by severe depletion of body fat, severe depletion of muscle mass.  GOAL:   Patient will meet greater than or equal to 90% of their needs  MONITOR:   PO intake, Supplement acceptance, Labs, Weight trends, Skin, I & O's  REASON FOR ASSESSMENT:   Malnutrition Screening Tool    ASSESSMENT:   Gina [name] is a 82 y.o. female with medical history significant of intraductal carcinoma status post partial mastectomy followed by Dr. Annamaria Boots, PVD, DM, HTN, HLD, latent TB, Graves' disease, gastric ulcerations, chronic anemia, CHF, progressive fatigue and concern for confusional state.  Pt admitted with fatigue and symptomatic anemia.   Hx obtained from pt and pt son at bedside. Pt reports that her appetite has been poor for about one month. She reveals her appetite is fair at baseline, but has been eating progressively less over the past 4 weeks. She reports she generally eats 3 times daily; breakfast is her "best meal" and then eats significantly less throughout the day. Pt son reports that he bought her some Boost and Ensure supplements, however, pt does not care for them.   Pt estimates she has lost about 7-10# in the past month and reveals UBW is around 120#. Wt hx within the past year has ranged from 103-115#.   Pt reports she tolerated her clear liquid diet well. She is amenable to try Boost Breeze supplement. Discussed importance of good meal and supplement intake to promote healing. Pt reports "I think I'll eat better once they figure out why I have all this blood in my stomach".   Nutrition-Focused physical exam completed. Findings are moderate to severe fat depletion, moderate to severe muscle depletion,  and no edema.   Labs reviewed: Na: 128 (on IV supplementation), K: 3.1 (on supplement).  Diet Order:  Diet regular Room service appropriate?: Yes; Fluid consistency:: Thin Diet NPO time specified  Skin:  Reviewed, no issues  Last BM:  01/22/16  Height:   Ht Readings from Last 1 Encounters:  01/22/16 5\' 7"  (1.702 m)    Weight:   Wt Readings from Last 1 Encounters:  01/22/16 114 lb (51.71 kg)    Ideal Body Weight:  61.4 kg  BMI:  Body mass index is 17.85 kg/(m^2).  Estimated Nutritional Needs:   Kcal:  1400-1600  Protein:  60-70 grams  Fluid:  1.4-1.6 L  EDUCATION NEEDS:   Education needs addressed  Dominigue Gellner A. Jimmye Norman, RD, LDN, CDE Pager: [phone] After hours Pager: [phone]

## 2016-01-23 NOTE — Progress Notes (Signed)
PROGRESS NOTE        PATIENT DETAILS Name: Gina [name] Age: 82 y.o. Sex: female Date of Birth: 1928/12/10 Admit Date: 01/22/2016 Admitting Physician Waldemar Dickens, MD [id number] [name], MD Outpatient Specialists:Dr Benson Norway  Brief Narrative: Patient is a 82 y.o. female with past medical history of proximal small bowel AVM's admitted on 4/24 for melena with acute blood loss anemia and hyponatremia.  Subjective: Claims did have melanotic-looking stools over the past few days. Seems minimally confused at times.  Assessment/Plan: Active Problems: Upper GI bleeding: Likely secondary to recurrent small bowel AVMs. Continue PPI, GI consulted for EGD in a.m.  Acute blood loss anemia: Secondary to above, transfused 2 units of PRBC. Hemoglobin stable. Follow  Hyponatremia: Secondary to GI bleeding and use of HCTZ. Continue gentle hydration, stop HCTZ. Follow electrolytes.  Acute encephalopathy: Suspect dementia at baseline, encephalopathy likely worsened secondary to hyponatremia, anemia. Suspect mental status.back to baseline. Follow.  History of present illness: Likely secondary to GI bleeding and use of diuretics. Improving with fluids.  Hypokalemia: Replete and recheck. Likely secondary to concurrent use of HCTZ and Lasix.  Chronic diastolic heart failure: Euvolemic on exam, hold diuretics for now. Follow  Hypothyroidism: Continue Synthroid  Dyslipidemia: Continue statin  Hypertension: Controlled continue lisinopril-follow. Diuretics remain on hold  DVT Prophylaxis: SCD's  Code Status: Full code  Family Communication: None at bedside  Disposition Plan: Remain inpatient  Antimicrobial agents: None  Procedures: None  CONSULTS:  GI  Time spent: 25- minutes-Greater than 50% of this time was spent in counseling, explanation of diagnosis, planning of further management, and coordination of care.  MEDICATIONS: Anti-infectives    None        Scheduled Meds: . atorvastatin  20 mg Oral q1800  . dorzolamide-timolol  1 drop Both Eyes BID  . furosemide  20 mg Oral Daily  . levothyroxine  75 mcg Oral QAC breakfast  . lisinopril  40 mg Oral Daily  . [START ON 01/26/2016] pantoprazole (PROTONIX) IV  40 mg Intravenous Q12H  . potassium chloride  40 mEq Oral BID  . sodium chloride flush  3 mL Intravenous Q12H  . sucralfate  1 g Oral TID WC & HS   Continuous Infusions: . sodium chloride 100 mL/hr at 01/22/16 2255  . sodium chloride    . pantoprozole (PROTONIX) infusion 8 mg/hr (01/22/16 2255)   PRN Meds:.acetaminophen **OR** acetaminophen, oxyCODONE   PHYSICAL EXAM: Vital signs: Filed Vitals:   01/23/16 0100 01/23/16 0149 01/23/16 0340 01/23/16 0403  BP: 149/40 133/40 140/41 158/42  Pulse: 51 49 50 53  Temp: 97.8 F (36.6 C) 97.6 F (36.4 C) 97.9 F (36.6 C) 98 F (36.7 C)  TempSrc: Oral Oral Oral Oral  Resp: 18 18 18 18   Height:      Weight:      SpO2: 100% 100% 100%    Filed Weights   01/22/16 1228 01/22/16 1640  Weight: 47.6 kg (104 lb 15 oz) 51.71 kg (114 lb)   Body mass index is 17.85 kg/(m^2).   Gen Exam: Awake andMostly alert-appears minimally confused.  Not in any distress  Neck: Supple, No JVD.   Chest: B/L Clear.   CVS: S1 S2 Regular, no murmurs.  Abdomen: soft, BS +, non tender, non distended.  Extremities: no edema, lower extremities warm to touch. Neurologic: Non Focal.   Skin:  No Rash or lesions   Wounds: N/A.   LABORATORY DATA: CBC:  Recent Labs Lab 01/22/16 1300 01/23/16 0702  WBC 4.3 5.3  NEUTROABS 3.0  --   HGB 6.1* 9.6*  HCT 19.2* 28.7*  MCV 73.3* 78.6  PLT 258 [id number]    Basic Metabolic Panel:  Recent Labs Lab 01/22/16 1300 01/23/16 0702  NA 125* 128*  K 2.8* 3.1*  CL 83* 89*  CO2 28 28  GLUCOSE 113* 89  BUN 23* 21*  CREATININE 1.15* 1.09*  CALCIUM 9.0 8.7*  MG 1.8  --     GFR: Estimated Creatinine Clearance: 31.4 mL/min (by C-G formula based on Cr of  1.09).  Liver Function Tests:  Recent Labs Lab 01/22/16 1300  AST 26  ALT 13*  ALKPHOS 62  BILITOT 1.0  PROT 6.7  ALBUMIN 3.5   No results for input(s): LIPASE, AMYLASE in the last 168 hours. No results for input(s): AMMONIA in the last 168 hours.  Coagulation Profile: No results for input(s): INR, PROTIME in the last 168 hours.  Cardiac Enzymes: No results for input(s): CKTOTAL, CKMB, CKMBINDEX, TROPONINI in the last 168 hours.  BNP (last 3 results) No results for input(s): PROBNP in the last 8760 hours.  HbA1C: No results for input(s): HGBA1C in the last 72 hours.  CBG: No results for input(s): GLUCAP in the last 168 hours.  Lipid Profile: No results for input(s): CHOL, HDL, LDLCALC, TRIG, CHOLHDL, LDLDIRECT in the last 72 hours.  Thyroid Function Tests: No results for input(s): TSH, T4TOTAL, FREET4, T3FREE, THYROIDAB in the last 72 hours.  Anemia Panel:  Recent Labs  01/22/16 2016  VITAMINB12 311  FOLATE 9.9  FERRITIN 11  TIBC 459*  IRON 16*  RETICCTPCT 1.3    Urine analysis:    Component Value Date/Time   COLORURINE YELLOW 01/22/2016 1300   APPEARANCEUR CLEAR 01/22/2016 1300   LABSPEC 1.005 01/22/2016 1300   PHURINE 5.5 01/22/2016 1300   GLUCOSEU NEGATIVE 01/22/2016 1300   GLUCOSEU NEG mg/dL 02/20/2009 2049   HGBUR NEGATIVE 01/22/2016 1300   BILIRUBINUR NEGATIVE 01/22/2016 1300   BILIRUBINUR Small 01/25/2014 1442   KETONESUR NEGATIVE 01/22/2016 1300   PROTEINUR NEGATIVE 01/22/2016 1300   PROTEINUR 30 mg/dL 01/25/2014 1442   UROBILINOGEN 0.2 04/25/2014 1023   UROBILINOGEN 1.0 01/25/2014 1442   NITRITE NEGATIVE 01/22/2016 1300   NITRITE Negative 01/25/2014 1442   LEUKOCYTESUR NEGATIVE 01/22/2016 1300    Sepsis Labs: Lactic Acid, Venous    Component Value Date/Time   LATICACIDVEN 1.8 04/09/2013 1833    MICROBIOLOGY: No results found for this or any previous visit (from the past 240 hour(s)).  RADIOLOGY STUDIES/RESULTS: Dg Chest 2  View  01/22/2016  CLINICAL DATA:  82 year old female with shortness of breath, confusion and generalized weakness EXAM: CHEST  2 VIEW COMPARISON:  Prior chest x-ray 06/13/2015 FINDINGS: Stable cardiac and mediastinal contours. Aortic atherosclerosis. Prominent right basilar nipple shadow. Mild left apical pleural parenchymal scarring. Background pulmonary hyperinflation with diffuse mild interstitial prominence and bronchitic change. No focal airspace consolidation, pleural effusion, pulmonary edema or pneumothorax. IMPRESSION: Stable chest x-ray including mild cardiomegaly and chronic pulmonary parenchymal changes suggesting underlying COPD without evidence of acute cardiopulmonary process. Electronically Signed   By: Jacqulynn Cadet M.D.   On: 01/22/2016 13:19     LOS: 1 day   Oren Binet, MD  [facility] Pager:[phone]  If 7PM-7AM, please contact night-coverage [email or web address] Password [facility] 01/23/2016, 2:14 PM

## 2016-01-24 ENCOUNTER — Encounter (HOSPITAL_COMMUNITY): Payer: Self-pay

## 2016-01-24 ENCOUNTER — Encounter (HOSPITAL_COMMUNITY)
Admission: EM | Disposition: A | Discharge status: Home Health | Payer: Self-pay | Source: Home / Self Care | Attending: Internal Medicine

## 2016-01-24 ENCOUNTER — Inpatient Hospital Stay (HOSPITAL_COMMUNITY): Payer: Medicare Other | Admitting: Anesthesiology

## 2016-01-24 DIAGNOSIS — I10 Essential (primary) hypertension: Secondary | ICD-10-CM

## 2016-01-24 HISTORY — PX: ENTEROSCOPY: SHX5533

## 2016-01-24 LAB — CBC
HCT: 27.6 % — ABNORMAL LOW (ref 36.0–46.0)
Hemoglobin: 9 g/dL — ABNORMAL LOW (ref 12.0–15.0)
MCH: 26.2 pg (ref 26.0–34.0)
MCHC: 32.6 g/dL (ref 30.0–36.0)
MCV: 80.2 fL (ref 78.0–100.0)
Platelets: 231 10*3/uL (ref 150–400)
RBC: 3.44 MIL/uL — ABNORMAL LOW (ref 3.87–5.11)
RDW: 15.9 % — ABNORMAL HIGH (ref 11.5–15.5)
WBC: 8.2 10*3/uL (ref 4.0–10.5)

## 2016-01-24 LAB — TYPE AND SCREEN
ABO/RH(D): O POS
Antibody Screen: NEGATIVE
Unit division: 0
Unit division: 0

## 2016-01-24 LAB — BASIC METABOLIC PANEL
Anion gap: 7 (ref 5–15)
BUN: 20 mg/dL (ref 6–20)
CO2: 25 mmol/L (ref 22–32)
Calcium: 8.5 mg/dL — ABNORMAL LOW (ref 8.9–10.3)
Chloride: 105 mmol/L (ref 101–111)
Creatinine, Ser: 0.87 mg/dL (ref 0.44–1.00)
GFR calc Af Amer: >60 mL/min (ref >60)
GFR calc non Af Amer: 59 mL/min — ABNORMAL LOW (ref >60)
Glucose, Bld: 107 mg/dL — ABNORMAL HIGH (ref 65–99)
Potassium: 3.7 mmol/L (ref 3.5–5.1)
Sodium: 137 mmol/L (ref 135–145)

## 2016-01-24 SURGERY — ENTEROSCOPY
Anesthesia: Monitor Anesthesia Care

## 2016-01-24 MED ORDER — PROPOFOL 500 MG/50ML IV EMUL
INTRAVENOUS | Status: DC | PRN
  Administered 2016-01-24: 125 ug/kg/min via INTRAVENOUS

## 2016-01-24 MED ORDER — LACTATED RINGERS IV SOLN
INTRAVENOUS | Status: DC | PRN
  Administered 2016-01-24: 13:00:00 via INTRAVENOUS

## 2016-01-24 MED ORDER — SODIUM CHLORIDE 0.9 % IV SOLN
500.0000 mg | INTRAVENOUS | Status: DC
  Administered 2016-01-25: 500 mg via INTRAVENOUS
  Filled 2016-01-24 (×2): qty 10

## 2016-01-24 MED ORDER — PNEUMOCOCCAL VAC POLYVALENT 25 MCG/0.5ML IJ INJ
0.5000 mL | INJECTION | INTRAMUSCULAR | Status: DC
  Filled 2016-01-24: qty 0.5

## 2016-01-24 MED ORDER — SODIUM CHLORIDE 0.9 % IV SOLN
25.0000 mg | Freq: Once | INTRAVENOUS | Status: AC
  Administered 2016-01-24: 25 mg via INTRAVENOUS
  Filled 2016-01-24: qty 0.5

## 2016-01-24 MED ORDER — HYDRALAZINE HCL 20 MG/ML IJ SOLN
5.0000 mg | Freq: Once | INTRAMUSCULAR | Status: AC
  Administered 2016-01-24: 5 mg via INTRAVENOUS
  Filled 2016-01-24: qty 1

## 2016-01-24 MED ORDER — PROPOFOL 10 MG/ML IV BOLUS
INTRAVENOUS | Status: DC | PRN
  Administered 2016-01-24: 15 mg via INTRAVENOUS
  Administered 2016-01-24 (×3): 20 mg via INTRAVENOUS

## 2016-01-24 NOTE — Interval H&P Note (Signed)
History and Physical Interval Note:  01/24/2016 1:29 PM  Gina [name]  has presented today for surgery, with the diagnosis of History of AVMs.  The various methods of treatment have been discussed with the patient and family. After consideration of risks, benefits and other options for treatment, the patient has consented to  Procedure(s) with comments: ENTEROSCOPY (N/A) - With APC. as a surgical intervention .  The patient's history has been reviewed, patient examined, no change in status, stable for surgery.  I have reviewed the patient's chart and labs.  Questions were answered to the patient's satisfaction.     Allanna Bresee D

## 2016-01-24 NOTE — Anesthesia Preprocedure Evaluation (Signed)
Anesthesia Evaluation  Patient identified by MRN, date of birth, ID band Patient awake    Reviewed: Allergy & Precautions, NPO status , Patient's Chart, lab work & pertinent test results  Airway Mallampati: II   Neck ROM: full    Dental   Pulmonary COPD, former smoker,    breath sounds clear to auscultation       Cardiovascular hypertension, + Peripheral Vascular Disease and +CHF   Rhythm:regular Rate:Normal     Neuro/Psych    GI/Hepatic PUD,   Endo/Other  diabetesHypothyroidism Hyperthyroidism   Renal/GU      Musculoskeletal  (+) Arthritis ,   Abdominal   Peds  Hematology   Anesthesia Other Findings   Reproductive/Obstetrics                             Anesthesia Physical Anesthesia Plan  ASA: III  Anesthesia Plan: MAC   Post-op Pain Management:    Induction: Intravenous  Airway Management Planned: Simple Face Mask  Additional Equipment:   Intra-op Plan:   Post-operative Plan:   Informed Consent: I have reviewed the patients History and Physical, chart, labs and discussed the procedure including the risks, benefits and alternatives for the proposed anesthesia with the patient or authorized representative who has indicated his/her understanding and acceptance.     Plan Discussed with: CRNA, Anesthesiologist and Surgeon  Anesthesia Plan Comments:         Anesthesia Quick Evaluation

## 2016-01-24 NOTE — Progress Notes (Addendum)
MEDICATION RELATED CONSULT NOTE - INITIAL   Pharmacy Consult for Iron Dextran Indication: Acute blood loss anemia 2nd GIB  No Known Allergies  Patient Measurements: Height: 5\' 7"  (170.2 cm) Weight: 114 lb (51.71 kg) IBW/kg (Calculated) : 61.6 Adjusted Body Weight:   Vital Signs: Temp: 98.4 F (36.9 C) (04/26 0522) Temp Source: Oral (04/26 0522) BP: 127/42 mmHg (04/26 0522) Pulse Rate: 56 (04/26 0522) Intake/Output from previous day: 04/25 0701 - 04/26 0700 In: 480 [P.O.:480] Out: -  Intake/Output from this shift: Total I/O In: 3 [I.V.:3] Out: -   Labs:  Recent Labs  01/22/16 1300 01/23/16 0702 01/24/16 0700  WBC 4.3 5.3 8.2  HGB 6.1* 9.6* 9.0*  HCT 19.2* 28.7* 27.6*  PLT 258 235 231  CREATININE 1.15* 1.09* 0.87  MG 1.8  --   --   ALBUMIN 3.5  --   --   PROT 6.7  --   --   AST 26  --   --   ALT 13*  --   --   ALKPHOS 62  --   --   BILITOT 1.0  --   --    Estimated Creatinine Clearance: 39.3 mL/min (by C-G formula based on Cr of 0.87).   Microbiology: No results found for this or any previous visit (from the past 720 hour(s)).  Medical History: Past Medical History  Diagnosis Date  . Intraductal carcinoma 06/2003    Of right breast. s/p right partial mastectomy. // Followed by Dr. Marylene Buerger  . PVD (peripheral vascular disease) ([place])     S/P BL femoral-popliteal bypass surgery 03/2001 (right) and 01/2003 (left)  . Diabetes mellitus, type 2 ([place])     Well controlled  . Hypertension   . Hyperlipidemia   . TB lung, latent     Treated with INH in 11/2007  . Arthritis     lt hip  . Thyroid disease     Graves disease  . Ulcer     gastric antral ulcer and AVMs  . Mesenteric ischemia ([place])   . Graves disease     HX OF GRAVES  . Anemia 11/30/2014  . Acute diastolic congestive heart failure ([place]) 01/28/2013  . Transfusion history     last admission 11-30-14    Medications:  Scheduled:  . [MAR Hold] atorvastatin  20 mg Oral q1800  . [MAR Hold]  dorzolamide-timolol  1 drop Both Eyes BID  . [MAR Hold] feeding supplement  1 Container Oral TID BM  . [MAR Hold] levothyroxine  75 mcg Oral QAC breakfast  . [MAR Hold] lisinopril  40 mg Oral Daily  . [MAR Hold] pantoprazole (PROTONIX) IV  40 mg Intravenous Q12H  . [MAR Hold] pneumococcal 23 valent vaccine  0.5 mL Intramuscular Tomorrow-1000  . [MAR Hold] potassium chloride  40 mEq Oral BID  . [MAR Hold] sodium chloride flush  3 mL Intravenous Q12H  . [MAR Hold] sucralfate  1 g Oral TID WC & HS    Assessment: 82yo female who presented with GIB and Hg 6.1, now stable s/p PRBC x 2.  Her Hg this AM is 9.  Pt is currently in Endoscopy.  Goal of Therapy:  Hg 12  Plan:  Iron Dextran 25mg  IV x 1 test dose, then 500mg  IV daily x 2 for total of 1000mg . F/U CBC Recommend putting pt on Fe po as well   Gracy Bruins, PharmD Clinical [facility]    01/24/16 [phone])  Orders entered  upon return from [place] [facility], PharmD

## 2016-01-24 NOTE — Progress Notes (Signed)
PROGRESS NOTE        PATIENT DETAILS Name: Gina [name] Age: 82 y.o. Sex: female Date of Birth: 1929-09-04 Admit Date: 01/22/2016 Admitting Physician Waldemar Dickens, MD [id number] [name], MD Outpatient Specialists:Dr Benson Norway  Brief Narrative: Patient is a 82 y.o. female with past medical history of proximal small bowel AVM's admitted on 4/24 for melena with acute blood loss anemia and hyponatremia.  Subjective: Had brown stools yesterday and this am. No abd pain   Assessment/Plan: Active Problems: Upper GI bleeding:Seems to have resolved. Likely secondary to recurrent small bowel AVMs. Continue PPI, GI consulted for EGD later today.  Acute blood loss anemia: Secondary to above, transfused 2 units of PRBC. Hemoglobin stable. Follow  Hyponatremia: Resolved-secondary to GI bleeding and use of HCTZ.  Follow electrolytes.  Acute encephalopathy: Suspect mild dementia at baseline, encephalopathy likely worsened secondary to hyponatremia, anemia. Suspect mental status.back to baseline-answers all my questions appropriately today.  [id number] to GI bleeding and use of HCTZ.  Hypokalemia: Repleted. Likely secondary to concurrent use of HCTZ and Lasix.  Chronic diastolic heart failure: Euvolemic on exam, resume Lasix on discharge. Would not resume HCTZ.   Hypothyroidism: Continue Synthroid  Dyslipidemia: Continue statin  Hypertension: Controlled continue lisinopril-follow. Diuretics remain on hold  DVT Prophylaxis: SCD's  Code Status: Full code  Family Communication: None at bedside  Disposition Plan: Remain inpatient  Antimicrobial agents: None  Procedures: None  CONSULTS:  GI  Time spent: 25- minutes-Greater than 50% of this time was spent in counseling, explanation of diagnosis, planning of further management, and coordination of care.  MEDICATIONS: Anti-infectives    None      Scheduled Meds: . atorvastatin  20 mg  Oral q1800  . dorzolamide-timolol  1 drop Both Eyes BID  . feeding supplement  1 Container Oral TID BM  . levothyroxine  75 mcg Oral QAC breakfast  . lisinopril  40 mg Oral Daily  . [START ON 01/26/2016] pantoprazole (PROTONIX) IV  40 mg Intravenous Q12H  . [START ON 01/25/2016] pneumococcal 23 valent vaccine  0.5 mL Intramuscular Tomorrow-1000  . potassium chloride  40 mEq Oral BID  . sodium chloride flush  3 mL Intravenous Q12H  . sucralfate  1 g Oral TID WC & HS   Continuous Infusions: . sodium chloride 50 mL/hr at 01/24/16 0608  . pantoprozole (PROTONIX) infusion 8 mg/hr (01/22/16 2255)   PRN Meds:.acetaminophen **OR** acetaminophen, oxyCODONE   PHYSICAL EXAM: Vital signs: Filed Vitals:   01/23/16 1517 01/23/16 2154 01/24/16 0522 01/24/16 0700  BP: 135/53 159/55 127/42   Pulse: 55 52 56   Temp: 98.1 F (36.7 C) 98.4 F (36.9 C) 98.4 F (36.9 C)   TempSrc: Oral Oral Oral   Resp: 16 20 20    Height:      Weight:    51.71 kg (114 lb)  SpO2: 100% 99% 94%    Filed Weights   01/22/16 1228 01/22/16 1640 01/24/16 0700  Weight: 47.6 kg (104 lb 15 oz) 51.71 kg (114 lb) 51.71 kg (114 lb)   Body mass index is 17.85 kg/(m^2).   Gen Exam: Awake andMostly alert-appears minimally confused.  Not in any distress  Neck: Supple, No JVD.   Chest: B/L Clear.   CVS: S1 S2 Regular, no murmurs.  Abdomen: soft, BS +, non tender, non distended.  Extremities: no edema, lower extremities  warm to touch. Neurologic: Non Focal.   Skin: No Rash or lesions   Wounds: N/A.   LABORATORY DATA: CBC:  Recent Labs Lab 01/22/16 1300 01/23/16 0702 01/24/16 0700  WBC 4.3 5.3 8.2  NEUTROABS 3.0  --   --   HGB 6.1* 9.6* 9.0*  HCT 19.2* 28.7* 27.6*  MCV 73.3* 78.6 80.2  PLT 258 235 [id number]    Basic Metabolic Panel:  Recent Labs Lab 01/22/16 1300 01/23/16 0702 01/24/16 0700  NA 125* 128* 137  K 2.8* 3.1* 3.7  CL 83* 89* 105  CO2 28 28 25   GLUCOSE 113* 89 107*  BUN 23* 21* 20  CREATININE  1.15* 1.09* 0.87  CALCIUM 9.0 8.7* 8.5*  MG 1.8  --   --     GFR: Estimated Creatinine Clearance: 39.3 mL/min (by C-G formula based on Cr of 0.87).  Liver Function Tests:  Recent Labs Lab 01/22/16 1300  AST 26  ALT 13*  ALKPHOS 62  BILITOT 1.0  PROT 6.7  ALBUMIN 3.5   No results for input(s): LIPASE, AMYLASE in the last 168 hours. No results for input(s): AMMONIA in the last 168 hours.  Coagulation Profile: No results for input(s): INR, PROTIME in the last 168 hours.  Cardiac Enzymes: No results for input(s): CKTOTAL, CKMB, CKMBINDEX, TROPONINI in the last 168 hours.  BNP (last 3 results) No results for input(s): PROBNP in the last 8760 hours.  HbA1C: No results for input(s): HGBA1C in the last 72 hours.  CBG: No results for input(s): GLUCAP in the last 168 hours.  Lipid Profile: No results for input(s): CHOL, HDL, LDLCALC, TRIG, CHOLHDL, LDLDIRECT in the last 72 hours.  Thyroid Function Tests: No results for input(s): TSH, T4TOTAL, FREET4, T3FREE, THYROIDAB in the last 72 hours.  Anemia Panel:  Recent Labs  01/22/16 2016  VITAMINB12 311  FOLATE 9.9  FERRITIN 11  TIBC 459*  IRON 16*  RETICCTPCT 1.3    Urine analysis:    Component Value Date/Time   COLORURINE YELLOW 01/22/2016 1300   APPEARANCEUR CLEAR 01/22/2016 1300   LABSPEC 1.005 01/22/2016 1300   PHURINE 5.5 01/22/2016 1300   GLUCOSEU NEGATIVE 01/22/2016 1300   GLUCOSEU NEG mg/dL 02/20/2009 2049   HGBUR NEGATIVE 01/22/2016 1300   BILIRUBINUR NEGATIVE 01/22/2016 1300   BILIRUBINUR Small 01/25/2014 1442   KETONESUR NEGATIVE 01/22/2016 1300   PROTEINUR NEGATIVE 01/22/2016 1300   PROTEINUR 30 mg/dL 01/25/2014 1442   UROBILINOGEN 0.2 04/25/2014 1023   UROBILINOGEN 1.0 01/25/2014 1442   NITRITE NEGATIVE 01/22/2016 1300   NITRITE Negative 01/25/2014 1442   LEUKOCYTESUR NEGATIVE 01/22/2016 1300    Sepsis Labs: Lactic Acid, Venous    Component Value Date/Time   LATICACIDVEN 1.8 04/09/2013  1833    MICROBIOLOGY: No results found for this or any previous visit (from the past 240 hour(s)).  RADIOLOGY STUDIES/RESULTS: Dg Chest 2 View  01/22/2016  CLINICAL DATA:  82 year old female with shortness of breath, confusion and generalized weakness EXAM: CHEST  2 VIEW COMPARISON:  Prior chest x-ray 06/13/2015 FINDINGS: Stable cardiac and mediastinal contours. Aortic atherosclerosis. Prominent right basilar nipple shadow. Mild left apical pleural parenchymal scarring. Background pulmonary hyperinflation with diffuse mild interstitial prominence and bronchitic change. No focal airspace consolidation, pleural effusion, pulmonary edema or pneumothorax. IMPRESSION: Stable chest x-ray including mild cardiomegaly and chronic pulmonary parenchymal changes suggesting underlying COPD without evidence of acute cardiopulmonary process. Electronically Signed   By: Jacqulynn Cadet M.D.   On: 01/22/2016 13:19     LOS:  2 days   Oren Binet, MD  [facility] Pager:[phone]  If 7PM-7AM, please contact night-coverage [email or web address] Password [email or web address] 01/24/2016, 11:12 AM

## 2016-01-24 NOTE — Progress Notes (Signed)
Physical Therapy Treatment Patient Details Name: Gina [name] MRN: [id number] DOB: [birth year] Today's Date: 01/24/2016    History of Present Illness Patient is a 82 y.o. female with past medical history of proximal small bowel AVM's admitted on 4/24 for melena with acute blood loss anemia and hyponatremia.    PT Comments    Patient progressing slowly towards PT goals. Demonstrates balance deficits during gait training today with 2 LOB requiring Min A to prevent fall. Encouraged use of SPC vs RW until strength/balance improves. Encouraged daily ambulation with RN. Will follow acutely to maximize independence and mobility prior to return home.  Follow Up Recommendations  Home health PT;Supervision/Assistance - 24 hour     Equipment Recommendations  None recommended by PT    Recommendations for Other Services       Precautions / Restrictions Precautions Precautions: Fall Restrictions Weight Bearing Restrictions: No    Mobility  Bed Mobility Overal bed mobility: Needs Assistance Bed Mobility: Supine to Sit     Supine to sit: Supervision;HOB elevated     General bed mobility comments: no difficulty, mild decreased in pace  Transfers Overall transfer level: Needs assistance Equipment used: None Transfers: Sit to/from Stand Sit to Stand: Min guard         General transfer comment: Min guard for safety. + dizziness. Transferred to chair post ambulation bout.  Ambulation/Gait Ambulation/Gait assistance: Min assist Ambulation Distance (Feet): 250 Feet Assistive device: None Gait Pattern/deviations: Step-through pattern;Decreased stride length;Narrow base of support;Staggering right;Staggering left Gait velocity: very slow   General Gait Details: Some staggering noted esp with slow gait speed and LOB x2 requiring Min A for support.   Stairs            Wheelchair Mobility    Modified Rankin (Stroke Patients Only)       Balance Overall balance  assessment: Needs assistance Sitting-balance support: Feet supported;No upper extremity supported Sitting balance-Leahy Scale: Good Sitting balance - Comments: Able to reach down and adjust socks without difficulty.    Standing balance support: During functional activity Standing balance-Leahy Scale: Fair                      Cognition Arousal/Alertness: Awake/alert Behavior During Therapy: WFL for tasks assessed/performed Overall Cognitive Status: Within Functional Limits for tasks assessed                      Exercises      General Comments General comments (skin integrity, edema, etc.): Granddaughter present in room.      Pertinent Vitals/Pain Pain Assessment: No/denies pain    Home Living                      Prior Function            PT Goals (current goals can now be found in the care plan section) Progress towards PT goals: Progressing toward goals    Frequency  Min 3X/week    PT Plan Current plan remains appropriate    Co-evaluation             End of Session Equipment Utilized During Treatment: Gait belt Activity Tolerance: Patient tolerated treatment well Patient left: in chair;with call bell/phone within reach;with family/visitor present     Time: 1125-1145 PT Time Calculation (min) (ACUTE ONLY): 20 min  Charges:  $Gait Training: 8-22 mins  G Codes:      Navia Lindahl A Susana Gripp 01/24/2016, 11:50 AM Wray Kearns, PT, DPT [phone]

## 2016-01-24 NOTE — Anesthesia Procedure Notes (Signed)
Procedure Name: MAC Date/Time: 01/24/2016 1:43 PM Performed by: Salli Quarry Domanique Huesman Pre-anesthesia Checklist: Patient identified, Emergency Drugs available, Suction available, Patient being monitored and Timeout performed Patient Re-evaluated:Patient Re-evaluated prior to inductionOxygen Delivery Method: Nasal cannula

## 2016-01-24 NOTE — Transfer of Care (Signed)
Immediate Anesthesia Transfer of Care Note  Patient: Gina [name]  Procedure(s) Performed: Procedure(s) with comments: ENTEROSCOPY (N/A) - With APC.  Patient Location: Endoscopy Unit  Anesthesia Type:MAC  Level of Consciousness: awake, alert  and patient cooperative  Airway & Oxygen Therapy: Patient Spontanous Breathing  Post-op Assessment: Report given to RN and Post -op Vital signs reviewed and stable  Post vital signs: Reviewed and stable  Last Vitals:  Filed Vitals:   01/24/16 0522 01/24/16 1244  BP: 127/42 207/34  Pulse: 56 55  Temp: 36.9 C 36.6 C  Resp: 20 11    Last Pain: There were no vitals filed for this visit.       Complications: No apparent anesthesia complications

## 2016-01-24 NOTE — Op Note (Signed)
[facility] Patient Name: Gina [name] Procedure Date : 01/24/2016 MRN: [id number] Attending MD: Carol Ada , MD Date of Birth: August 13, 1929 CSN: [id number] Age: 82 Admit Type: Inpatient Procedure:                Small bowel enteroscopy Indications:              Melena, Occult blood in stool Providers:                Carol Ada, MD, Carolynn Comment, RN, Despina Pole, Technician, Lavona Mound, CRNA Referring MD:              Medicines:                Propofol per Anesthesia Complications:            No immediate complications. Estimated Blood Loss:     Estimated blood loss: none. Procedure:                Pre-Anesthesia Assessment:                           - Prior to the procedure, a History and Physical                            was performed, and patient medications and                            allergies were reviewed. The patient's tolerance of                            previous anesthesia was also reviewed. The risks                            and benefits of the procedure and the sedation                            options and risks were discussed with the patient.                            All questions were answered, and informed consent                            was obtained. Prior Anticoagulants: The patient has                            taken no previous anticoagulant or antiplatelet                            agents. ASA Grade Assessment: III - A patient with                            severe systemic disease. After reviewing the risks  and benefits, the patient was deemed in                            satisfactory condition to undergo the procedure.                           - Sedation was administered by an anesthesia                            professional. Deep sedation was attained.                           After obtaining informed consent, the endoscope was   passed under direct vision. Throughout the                            procedure, the patient's blood pressure, pulse, and                            oxygen saturations were monitored continuously. The                            [id number] scope was introduced through                            the mouth and advanced to the proximal jejunum. The                            small bowel enteroscopy was accomplished without                            difficulty. The patient tolerated the procedure                            well. Scope In: Scope Out: Findings:      The examined esophagus was normal.      A single localized, 3 mm non-bleeding erosion was found in the gastric       antrum. There were no stigmata of recent bleeding.      Multiple non-bleeding superficial duodenal ulcers with evidence of       recent bleeding were found in the second portion of the duodenum, in the       third portion of the duodenum and in the fourth portion of the duodenum.       The largest lesion was 4 mm in largest dimension. Estimated blood loss:       none.      Multiple angiodysplastic lesions with no bleeding were found in the       proximal jejunum. Coagulation for tissue destruction using argon plasma       at 0.3 liters/minute and 20 watts was successful. Impression:               - Normal esophagus.                           - Non-bleeding erosive gastropathy.                           -  Multiple non-bleeding duodenal ulcers with                            evidence of recent bleeding.                           - Multiple non-bleeding angiodysplastic lesions in                            the jejunum. Treated with argon plasma coagulation                            (APC).                           - No specimens collected.                           - PPI BID x 1 month and then QD indefinitely.                           - Avoid NSAIDs.                           - Follow up in the office in  two weeks. Recommendation:           - Return patient to hospital ward for ongoing care. Procedure Code(s):        --- Professional ---                           [phone], Small intestinal endoscopy, enteroscopy                            beyond second portion of duodenum, not including                            ileum; with ablation of tumor(s), polyp(s), or                            other lesion(s) not amenable to removal by hot                            biopsy forceps, bipolar cautery or snare technique Diagnosis Code(s):        --- Professional ---                           K31.89, Other diseases of stomach and duodenum                           K26.9, Duodenal ulcer, unspecified as acute or                            chronic, without hemorrhage or perforation                           K55.20, Angiodysplasia of  colon without hemorrhage                           K92.1, Melena (includes Hematochezia)                           R19.5, Other fecal abnormalities CPT copyright 2016 [employer]. All rights reserved. The codes documented in this report are preliminary and upon coder review may  be revised to meet current compliance requirements. Carol Ada, MD Carol Ada, MD 01/24/2016 2:04:42 PM This report has been signed electronically. Number of Addenda: 0

## 2016-01-24 NOTE — H&P (View-Only) (Signed)
PROGRESS NOTE        PATIENT DETAILS Name: Gina [name] Age: 82 y.o. Sex: female Date of Birth: 14-Nov-1928 Admit Date: 01/22/2016 Admitting Physician Waldemar Dickens, MD [id number] [name], MD Outpatient Specialists:Dr Benson Norway  Brief Narrative: Patient is a 82 y.o. female with past medical history of proximal small bowel AVM's admitted on 4/24 for melena with acute blood loss anemia and hyponatremia.  Subjective: Had brown stools yesterday and this am. No abd pain   Assessment/Plan: Active Problems: Upper GI bleeding:Seems to have resolved. Likely secondary to recurrent small bowel AVMs. Continue PPI, GI consulted for EGD later today.  Acute blood loss anemia: Secondary to above, transfused 2 units of PRBC. Hemoglobin stable. Follow  Hyponatremia: Resolved-secondary to GI bleeding and use of HCTZ.  Follow electrolytes.  Acute encephalopathy: Suspect mild dementia at baseline, encephalopathy likely worsened secondary to hyponatremia, anemia. Suspect mental status.back to baseline-answers all my questions appropriately today.  [id number] to GI bleeding and use of HCTZ.  Hypokalemia: Repleted. Likely secondary to concurrent use of HCTZ and Lasix.  Chronic diastolic heart failure: Euvolemic on exam, resume Lasix on discharge. Would not resume HCTZ.   Hypothyroidism: Continue Synthroid  Dyslipidemia: Continue statin  Hypertension: Controlled continue lisinopril-follow. Diuretics remain on hold  DVT Prophylaxis: SCD's  Code Status: Full code  Family Communication: None at bedside  Disposition Plan: Remain inpatient  Antimicrobial agents: None  Procedures: None  CONSULTS:  GI  Time spent: 25- minutes-Greater than 50% of this time was spent in counseling, explanation of diagnosis, planning of further management, and coordination of care.  MEDICATIONS: Anti-infectives    None      Scheduled Meds: . atorvastatin  20 mg  Oral q1800  . dorzolamide-timolol  1 drop Both Eyes BID  . feeding supplement  1 Container Oral TID BM  . levothyroxine  75 mcg Oral QAC breakfast  . lisinopril  40 mg Oral Daily  . [START ON 01/26/2016] pantoprazole (PROTONIX) IV  40 mg Intravenous Q12H  . [START ON 01/25/2016] pneumococcal 23 valent vaccine  0.5 mL Intramuscular Tomorrow-1000  . potassium chloride  40 mEq Oral BID  . sodium chloride flush  3 mL Intravenous Q12H  . sucralfate  1 g Oral TID WC & HS   Continuous Infusions: . sodium chloride 50 mL/hr at 01/24/16 0608  . pantoprozole (PROTONIX) infusion 8 mg/hr (01/22/16 2255)   PRN Meds:.acetaminophen **OR** acetaminophen, oxyCODONE   PHYSICAL EXAM: Vital signs: Filed Vitals:   01/23/16 1517 01/23/16 2154 01/24/16 0522 01/24/16 0700  BP: 135/53 159/55 127/42   Pulse: 55 52 56   Temp: 98.1 F (36.7 C) 98.4 F (36.9 C) 98.4 F (36.9 C)   TempSrc: Oral Oral Oral   Resp: 16 20 20    Height:      Weight:    51.71 kg (114 lb)  SpO2: 100% 99% 94%    Filed Weights   01/22/16 1228 01/22/16 1640 01/24/16 0700  Weight: 47.6 kg (104 lb 15 oz) 51.71 kg (114 lb) 51.71 kg (114 lb)   Body mass index is 17.85 kg/(m^2).   Gen Exam: Awake andMostly alert-appears minimally confused.  Not in any distress  Neck: Supple, No JVD.   Chest: B/L Clear.   CVS: S1 S2 Regular, no murmurs.  Abdomen: soft, BS +, non tender, non distended.  Extremities: no edema, lower extremities  warm to touch. Neurologic: Non Focal.   Skin: No Rash or lesions   Wounds: N/A.   LABORATORY DATA: CBC:  Recent Labs Lab 01/22/16 1300 01/23/16 0702 01/24/16 0700  WBC 4.3 5.3 8.2  NEUTROABS 3.0  --   --   HGB 6.1* 9.6* 9.0*  HCT 19.2* 28.7* 27.6*  MCV 73.3* 78.6 80.2  PLT 258 235 [id number]    Basic Metabolic Panel:  Recent Labs Lab 01/22/16 1300 01/23/16 0702 01/24/16 0700  NA 125* 128* 137  K 2.8* 3.1* 3.7  CL 83* 89* 105  CO2 28 28 25   GLUCOSE 113* 89 107*  BUN 23* 21* 20  CREATININE  1.15* 1.09* 0.87  CALCIUM 9.0 8.7* 8.5*  MG 1.8  --   --     GFR: Estimated Creatinine Clearance: 39.3 mL/min (by C-G formula based on Cr of 0.87).  Liver Function Tests:  Recent Labs Lab 01/22/16 1300  AST 26  ALT 13*  ALKPHOS 62  BILITOT 1.0  PROT 6.7  ALBUMIN 3.5   No results for input(s): LIPASE, AMYLASE in the last 168 hours. No results for input(s): AMMONIA in the last 168 hours.  Coagulation Profile: No results for input(s): INR, PROTIME in the last 168 hours.  Cardiac Enzymes: No results for input(s): CKTOTAL, CKMB, CKMBINDEX, TROPONINI in the last 168 hours.  BNP (last 3 results) No results for input(s): PROBNP in the last 8760 hours.  HbA1C: No results for input(s): HGBA1C in the last 72 hours.  CBG: No results for input(s): GLUCAP in the last 168 hours.  Lipid Profile: No results for input(s): CHOL, HDL, LDLCALC, TRIG, CHOLHDL, LDLDIRECT in the last 72 hours.  Thyroid Function Tests: No results for input(s): TSH, T4TOTAL, FREET4, T3FREE, THYROIDAB in the last 72 hours.  Anemia Panel:  Recent Labs  01/22/16 2016  VITAMINB12 311  FOLATE 9.9  FERRITIN 11  TIBC 459*  IRON 16*  RETICCTPCT 1.3    Urine analysis:    Component Value Date/Time   COLORURINE YELLOW 01/22/2016 1300   APPEARANCEUR CLEAR 01/22/2016 1300   LABSPEC 1.005 01/22/2016 1300   PHURINE 5.5 01/22/2016 1300   GLUCOSEU NEGATIVE 01/22/2016 1300   GLUCOSEU NEG mg/dL 02/20/2009 2049   HGBUR NEGATIVE 01/22/2016 1300   BILIRUBINUR NEGATIVE 01/22/2016 1300   BILIRUBINUR Small 01/25/2014 1442   KETONESUR NEGATIVE 01/22/2016 1300   PROTEINUR NEGATIVE 01/22/2016 1300   PROTEINUR 30 mg/dL 01/25/2014 1442   UROBILINOGEN 0.2 04/25/2014 1023   UROBILINOGEN 1.0 01/25/2014 1442   NITRITE NEGATIVE 01/22/2016 1300   NITRITE Negative 01/25/2014 1442   LEUKOCYTESUR NEGATIVE 01/22/2016 1300    Sepsis Labs: Lactic Acid, Venous    Component Value Date/Time   LATICACIDVEN 1.8 04/09/2013  1833    MICROBIOLOGY: No results found for this or any previous visit (from the past 240 hour(s)).  RADIOLOGY STUDIES/RESULTS: Dg Chest 2 View  01/22/2016  CLINICAL DATA:  82 year old female with shortness of breath, confusion and generalized weakness EXAM: CHEST  2 VIEW COMPARISON:  Prior chest x-ray 06/13/2015 FINDINGS: Stable cardiac and mediastinal contours. Aortic atherosclerosis. Prominent right basilar nipple shadow. Mild left apical pleural parenchymal scarring. Background pulmonary hyperinflation with diffuse mild interstitial prominence and bronchitic change. No focal airspace consolidation, pleural effusion, pulmonary edema or pneumothorax. IMPRESSION: Stable chest x-ray including mild cardiomegaly and chronic pulmonary parenchymal changes suggesting underlying COPD without evidence of acute cardiopulmonary process. Electronically Signed   By: Jacqulynn Cadet M.D.   On: 01/22/2016 13:19     LOS:  2 days   Oren Binet, MD  [facility] Pager:[phone]  If 7PM-7AM, please contact night-coverage [email or web address] Password [email or web address] 01/24/2016, 11:12 AM

## 2016-01-25 ENCOUNTER — Encounter (HOSPITAL_COMMUNITY): Payer: Self-pay | Admitting: Gastroenterology

## 2016-01-25 MED ORDER — POTASSIUM CHLORIDE CRYS ER 20 MEQ PO TBCR: 20.0000 meq | EXTENDED_RELEASE_TABLET | Freq: Every day | ORAL | Status: DC

## 2016-01-25 MED ORDER — BOOST / RESOURCE BREEZE PO LIQD: 1.0000 | Freq: Three times a day (TID) | ORAL | Status: DC

## 2016-01-25 MED ORDER — SUCRALFATE 1 GM/10ML PO SUSP: 1.0000 g | Freq: Three times a day (TID) | ORAL | Status: DC

## 2016-01-25 MED ORDER — SENNA 8.6 MG PO TABS: 2.0000 | ORAL_TABLET | Freq: Every day | ORAL | Status: DC

## 2016-01-25 MED ORDER — PANTOPRAZOLE SODIUM 40 MG PO TBEC: 40.0000 mg | DELAYED_RELEASE_TABLET | Freq: Two times a day (BID) | ORAL | Status: DC

## 2016-01-25 MED ORDER — FUROSEMIDE 20 MG PO TABS
20.0000 mg | ORAL_TABLET | Freq: Every day | ORAL | Status: DC
  Administered 2016-01-25: 20 mg via ORAL
  Filled 2016-01-25: qty 1

## 2016-01-25 MED ORDER — LISINOPRIL 40 MG PO TABS: 40.0000 mg | ORAL_TABLET | Freq: Every day | ORAL | Status: DC

## 2016-01-25 MED ORDER — FERROUS SULFATE 325 (65 FE) MG PO TABS: 325.0000 mg | ORAL_TABLET | Freq: Two times a day (BID) | ORAL | Status: DC

## 2016-01-25 NOTE — Care Management Important Message (Signed)
Important Message  Patient Details  Name: Gina [name] MRN: [id number] Date of Birth: Mar 22, 1929   Medicare Important Message Given:  Yes    [name] 01/25/2016, 1:56 PM

## 2016-01-25 NOTE — Discharge Summary (Signed)
PATIENT DETAILS Name: Gina [name] Age: 82 y.o. Sex: female Date of Birth: Jun 24, 1929 MRN: [id number]. Admitting Physician: Waldemar Dickens, MD [id number] [name], MD  Admit Date: 01/22/2016 Discharge date: 01/25/2016  Recommendations for Outpatient Follow-up:  1. Please repeat CBC/BMET at next visit 2. Has iron deficiency-started on iron-please follow reticulocyte count and CBC 3. Avoid NSAIDs 4. PPI twice a day for one month, and then daily indefinitely  PRIMARY DISCHARGE DIAGNOSIS:  Active Problems:   Essential hypertension   Thyroid nodule   Hyponatremia   GI bleed   Symptomatic anemia   Fatigue   Subacute confusional state   Chronic diastolic congestive heart failure (HCC)      PAST MEDICAL HISTORY: Past Medical History  Diagnosis Date  . Intraductal carcinoma 06/2003    Of right breast. s/p right partial mastectomy. // Followed by Dr. Marylene Buerger  . PVD (peripheral vascular disease) ([place])     S/P BL femoral-popliteal bypass surgery 03/2001 (right) and 01/2003 (left)  . Diabetes mellitus, type 2 ([place])     Well controlled  . Hypertension   . Hyperlipidemia   . TB lung, latent     Treated with INH in 11/2007  . Arthritis     lt hip  . Thyroid disease     Graves disease  . Ulcer     gastric antral ulcer and AVMs  . Mesenteric ischemia ([place])   . Graves disease     HX OF GRAVES  . Anemia 11/30/2014  . Acute diastolic congestive heart failure ([place]) 01/28/2013  . Transfusion history     last admission 11-30-14    DISCHARGE MEDICATIONS: Current Discharge Medication List    START taking these medications   Details  lisinopril (PRINIVIL,ZESTRIL) 40 MG tablet Take 1 tablet (40 mg total) by mouth daily. Qty: 30 tablet, Refills: 0    potassium chloride SA (K-DUR,KLOR-CON) 20 MEQ tablet Take 1 tablet (20 mEq total) by mouth daily. Qty: 30 tablet, Refills: 0    senna (SENOKOT) 8.6 MG TABS tablet Take 2 tablets (17.2 mg total) by mouth at bedtime. Qty: 60 each,  Refills: 0    sucralfate (CARAFATE) 1 GM/10ML suspension Take 10 mLs (1 g total) by mouth 4 (four) times daily -  with meals and at bedtime. Qty: 420 mL, Refills: 0      CONTINUE these medications which have CHANGED   Details  feeding supplement (BOOST / RESOURCE BREEZE) LIQD Take 1 Container by mouth 3 (three) times daily between meals. Qty: 90 Container, Refills: 0    ferrous sulfate 325 (65 FE) MG tablet Take 1 tablet (325 mg total) by mouth 2 (two) times daily with a meal. Qty: 90 tablet, Refills: 0    pantoprazole (PROTONIX) 40 MG tablet Take 1 tablet (40 mg total) by mouth 2 (two) times daily. Take twice daily for 1 month, and then daily indefinitely Qty: 120 tablet, Refills: 0      CONTINUE these medications which have NOT CHANGED   Details  atorvastatin (LIPITOR) 20 MG tablet Take 1 tablet (20 mg total) by mouth daily at 6 PM. Qty: 90 tablet, Refills: 0   Associated Diagnoses: PVD (peripheral vascular disease) (HCC)    calcium citrate-vitamin D (CITRACAL+D) 315-200 MG-UNIT per tablet Take 2 tablets by mouth 2 (two) times daily. Qty: 120 tablet, Refills: 11    cholecalciferol (VITAMIN D) 1000 UNITS tablet Take 1,000 Units by mouth daily.    dorzolamide-timolol (COSOPT) 22.3-6.8 MG/ML ophthalmic solution Place 1  drop into both eyes 2 (two) times daily.    Associated Diagnoses: Diabetes mellitus, type 2 (HCC)    furosemide (LASIX) 20 MG tablet TAKE 1 TABLET BY MOUTH EVERY DAY Qty: 30 tablet, Refills: 1    levothyroxine (SYNTHROID, LEVOTHROID) 75 MCG tablet Take 1 tablet (75 mcg total) by mouth daily. Qty: 30 tablet, Refills: 1    Multiple Vitamin (MULTIVITAMIN WITH MINERALS) TABS tablet Take 1 tablet by mouth daily.    diclofenac sodium (VOLTAREN) 1 % GEL Apply 2 g topically 4 (four) times daily. Qty: 1 Tube, Refills: 0   Associated Diagnoses: Rib pain on left side      STOP taking these medications     aspirin EC 81 MG tablet      Docusate Sodium (DSS) 100 MG  CAPS      lisinopril-hydrochlorothiazide (PRINZIDE,ZESTORETIC) 20-12.5 MG tablet      varenicline (CHANTIX PAK) 0.5 MG X 11 & 1 MG X 42 tablet         ALLERGIES:  No Known Allergies  BRIEF HPI:  See H&P, Labs, Consult and Test reports for all details in brief, Patient is a 82 y.o. female with past medical history of proximal small bowel AVM's admitted on 4/24 for melena with acute blood loss anemia and hyponatremia.   CONSULTATIONS:   GI  PERTINENT RADIOLOGIC STUDIES: Dg Chest 2 View  01/22/2016  CLINICAL DATA:  82 year old female with shortness of breath, confusion and generalized weakness EXAM: CHEST  2 VIEW COMPARISON:  Prior chest x-ray 06/13/2015 FINDINGS: Stable cardiac and mediastinal contours. Aortic atherosclerosis. Prominent right basilar nipple shadow. Mild left apical pleural parenchymal scarring. Background pulmonary hyperinflation with diffuse mild interstitial prominence and bronchitic change. No focal airspace consolidation, pleural effusion, pulmonary edema or pneumothorax. IMPRESSION: Stable chest x-ray including mild cardiomegaly and chronic pulmonary parenchymal changes suggesting underlying COPD without evidence of acute cardiopulmonary process. Electronically Signed   By: Jacqulynn Cadet M.D.   On: 01/22/2016 13:19     PERTINENT LAB RESULTS: CBC:  Recent Labs  01/23/16 0702 01/24/16 0700  WBC 5.3 8.2  HGB 9.6* 9.0*  HCT 28.7* 27.6*  PLT 235 231   CMET CMP     Component Value Date/Time   NA 137 01/24/2016 0700   NA 141 06/28/2015 1205   K 3.7 01/24/2016 0700   CL 105 01/24/2016 0700   CO2 25 01/24/2016 0700   GLUCOSE 107* 01/24/2016 0700   GLUCOSE 103* 06/28/2015 1205   BUN 20 01/24/2016 0700   BUN 19 06/28/2015 1205   CREATININE 0.87 01/24/2016 0700   CREATININE 0.96 04/13/2015 1009   CALCIUM 8.5* 01/24/2016 0700   PROT 6.7 01/22/2016 1300   ALBUMIN 3.5 01/22/2016 1300   AST 26 01/22/2016 1300   ALT 13* 01/22/2016 1300   ALKPHOS 62  01/22/2016 1300   BILITOT 1.0 01/22/2016 1300   GFRNONAA 59* 01/24/2016 0700   GFRNONAA 55* 04/13/2015 1009   GFRAA >60 01/24/2016 0700   GFRAA 63 04/13/2015 1009    GFR Estimated Creatinine Clearance: 37.5 mL/min (by C-G formula based on Cr of 0.87). No results for input(s): LIPASE, AMYLASE in the last 72 hours. No results for input(s): CKTOTAL, CKMB, CKMBINDEX, TROPONINI in the last 72 hours. Invalid input(s): POCBNP No results for input(s): DDIMER in the last 72 hours. No results for input(s): HGBA1C in the last 72 hours. No results for input(s): CHOL, HDL, LDLCALC, TRIG, CHOLHDL, LDLDIRECT in the last 72 hours. No results for input(s): TSH, T4TOTAL, T3FREE,  THYROIDAB in the last 72 hours.  Invalid input(s): FREET3  Recent Labs  01/22/16 2016  VITAMINB12 311  FOLATE 9.9  FERRITIN 11  TIBC 459*  IRON 16*  RETICCTPCT 1.3   Coags: No results for input(s): INR in the last 72 hours.  Invalid input(s): PT Microbiology: No results found for this or any previous visit (from the past 240 hour(s)).   BRIEF HOSPITAL COURSE:  Upper GI bleeding:Resolved, now having brown stools. Likely secondary to recurrent small bowel AVMs. Underwent endoscopy on 4/26 which showed nonerosive gastropathy, multiple nonbleeding duodenal ulcers with recent evidence of bleeding, multiple nonbleeding AVM in the jejunum. GI recommends PPI twice a day for one month, and then daily indefinitely. Patient aware that she needs to stop using NSAIDs.   Acute blood loss anemia: Secondary to above, transfused 2 units of PRBC. Hemoglobin stable. Follow closely in the outpatient setting  Hyponatremia: Resolved-secondary to GI bleeding and use of HCTZ. Follow electrolytes closely in the outpatient setting  Acute encephalopathy: Suspect mild dementia at baseline, encephalopathy likely worsened secondary to hyponatremia, anemia. Suspect mental status.back to baseline-answers all my questions appropriately today.  Further workup deferred to the outpatient setting  [id number] to GI bleeding and use of HCTZ.  Hypokalemia: Repleted. Likely secondary to concurrent use of HCTZ and Lasix. Continue potassium supplementation while on Lasix  Chronic diastolic heart failure: Euvolemic on exam, resume Lasix on discharge. Would not resume HCTZ.   Hypothyroidism: Continue Synthroid  Dyslipidemia: Continue statin  Hypertension: Controlled continue and Lasix, further optimization deferred to the outpatient setting  TODAY-DAY OF DISCHARGE:  Subjective:   Gina [name] today has no headache,no chest abdominal pain,no new weakness tingling or numbness, feels much better wants to go home today.  Objective:   Blood pressure 148/43, pulse 55, temperature 99.2 F (37.3 C), temperature source Oral, resp. rate 18, height 5\' 7"  (1.702 m), weight 49.442 kg (109 lb), SpO2 99 %.  Intake/Output Summary (Last 24 hours) at 01/25/16 1055 Last data filed at 01/25/16 0641  Gross per 24 hour  Intake 1664.17 ml  Output      0 ml  Net 1664.17 ml   Filed Weights   01/22/16 1640 01/24/16 0700 01/25/16 0523  Weight: 51.71 kg (114 lb) 51.71 kg (114 lb) 49.442 kg (109 lb)    Exam Awake Alert, Oriented *3, No new F.N deficits, Normal affect [place].AT,PERRAL Supple Neck,No JVD, No cervical lymphadenopathy appriciated.  Symmetrical Chest wall movement, Good air movement bilaterally, CTAB RRR,No Gallops,Rubs or new Murmurs, No Parasternal Heave +ve B.Sounds, Abd Soft, Non tender, No organomegaly appriciated, No rebound -guarding or rigidity. No Cyanosis, Clubbing or edema, No new Rash or bruise  DISCHARGE CONDITION: Stable  DISPOSITION: Home with home health services  DISCHARGE INSTRUCTIONS:    Activity:  As tolerated with Full fall precautions use walker/cane & assistance as needed  Get Medicines reviewed and adjusted: Please take all your medications with you for your next visit with your Primary  MD  Please request your Primary MD to go over all hospital tests and procedure/radiological results at the follow up, please ask your Primary MD to get all Hospital records sent to his/her office.  If you experience worsening of your admission symptoms, develop shortness of breath, life threatening emergency, suicidal or homicidal thoughts you must seek medical attention immediately by calling 911 or calling your MD immediately  if symptoms less severe.  You must read complete instructions/literature along with all the possible adverse reactions/side effects for all the  Medicines you take and that have been prescribed to you. Take any new Medicines after you have completely understood and accpet all the possible adverse reactions/side effects.   Do not drive when taking Pain medications.   Do not take more than prescribed Pain, Sleep and Anxiety Medications  Special Instructions: If you have smoked or chewed Tobacco  in the last 2 yrs please stop smoking, stop any regular Alcohol  and or any Recreational drug use.  Wear Seat belts while driving.  Please note  You were cared for by a hospitalist during your hospital stay. Once you are discharged, your primary care physician will handle any further medical issues. Please note that NO REFILLS for any discharge medications will be authorized once you are discharged, as it is imperative that you return to your primary care physician (or establish a relationship with a primary care physician if you do not have one) for your aftercare needs so that they can reassess your need for medications and monitor your lab values.   Diet recommendation: Heart Healthy diet  Discharge Instructions    Diet - low sodium heart healthy    Complete by:  As directed      Discharge instructions    Complete by:  As directed   Please do not take NSAIDs (Motrin, Aleve, Advil). If you have pain, try Tylenol.     Increase activity slowly    Complete by:  As directed             Follow-up Information    Follow up with [facility].   Why:  Home health PT arranged   Contact information:   [address] [place] [address] [phone]       Follow up with Rogers Blocker, MD. Schedule an appointment as soon as possible for a visit in 1 week.   Specialty:  Internal Medicine   Why:  Hospital follow up   Contact information:   [place] [address] [phone]       Follow up with HUNG,PATRICK D, MD. Schedule an appointment as soon as possible for a visit in 2 weeks.   Specialty:  Gastroenterology   Why:  Hospital follow up   Contact information:   [address] [place] [address] [phone]      Total Time spent on discharge equals 45 minutes.  SignedOren Binet 01/25/2016 10:55 AM

## 2016-01-25 NOTE — Progress Notes (Signed)
Physical Therapy Treatment Patient Details Name: Gina [name] MRN: [id number] DOB: 1929/03/29 Today's Date: 01/25/2016    History of Present Illness Patient is a 82 y.o. female with past medical history of proximal small bowel AVM's admitted on 4/24 for melena with acute blood loss anemia and hyponatremia.    PT Comments    Patient continues to demonstrate balance deficits especially when multi tasking, conversing or when distracted. Only 1 LOB today requiring Min A to correct. Encouraged use of DME however pt refuses. Able to perform dynamic standing activities- making bed and reaching outside BoS without difficulty. Plans to discharge home today. Would benefit from HHPT for balance and to decrease fall risk.   Follow Up Recommendations  Home health PT;Supervision/Assistance - 24 hour     Equipment Recommendations  None recommended by PT    Recommendations for Other Services       Precautions / Restrictions Precautions Precautions: Fall Restrictions Weight Bearing Restrictions: No    Mobility  Bed Mobility Overal bed mobility: Needs Assistance Bed Mobility: Supine to Sit     Supine to sit: Supervision;HOB elevated     General bed mobility comments: No difficulty or assist needed. Decreased pace.  Transfers Overall transfer level: Needs assistance Equipment used: None Transfers: Sit to/from Stand Sit to Stand: Supervision         General transfer comment: Supervision for safety. Transferred to chair post ambulation bout.  Ambulation/Gait Ambulation/Gait assistance: Min assist Ambulation Distance (Feet): 250 Feet Assistive device: None Gait Pattern/deviations: Step-through pattern;Decreased stride length;Narrow base of support;Scissoring;Drifts right/left;Leaning posteriorly Gait velocity: very slow Gait velocity interpretation: <1.8 ft/sec, indicative of risk for recurrent falls General Gait Details: Some staggering noted with posterior LOB x1 esp when  distracted or with slow gait speed, Min A for support. Scrissoring at times.    Stairs            Wheelchair Mobility    Modified Rankin (Stroke Patients Only)       Balance Overall balance assessment: Needs assistance Sitting-balance support: Feet supported;No upper extremity supported Sitting balance-Leahy Scale: Good     Standing balance support: During functional activity Standing balance-Leahy Scale: Fair Standing balance comment: Able to assist with fixing sheets and making her bed in standing reaching outside BoS.             High level balance activites: Head turns High Level Balance Comments: Tolerated head turns but deviations in gait noted with conversation or trying to multi task.    Cognition Arousal/Alertness: Awake/alert Behavior During Therapy: WFL for tasks assessed/performed Overall Cognitive Status: Within Functional Limits for tasks assessed (except poor safety awareness.)                      Exercises      General Comments        Pertinent Vitals/Pain Pain Assessment: No/denies pain    Home Living                      Prior Function            PT Goals (current goals can now be found in the care plan section) Progress towards PT goals: Progressing toward goals    Frequency  Min 3X/week    PT Plan Current plan remains appropriate    Co-evaluation             End of Session Equipment Utilized During Treatment: Gait belt Activity Tolerance: Patient tolerated treatment  well Patient left: in chair;with call bell/phone within reach     Time: 1403-1430 PT Time Calculation (min) (ACUTE ONLY): 27 min  Charges:  $Gait Training: 8-22 mins $Therapeutic Exercise: 8-22 mins                    G Codes:      Sherald Balbuena A Dietra Stokely 01/25/2016, 3:10 PM  Wray Kearns, [place], DPT [phone]

## 2016-01-25 NOTE — Anesthesia Postprocedure Evaluation (Signed)
Anesthesia Post Note  Patient: Gina [name]  Procedure(s) Performed: Procedure(s) (LRB): ENTEROSCOPY (N/A)  Patient location during evaluation: PACU Anesthesia Type: MAC Level of consciousness: awake and alert Pain management: pain level controlled Vital Signs Assessment: post-procedure vital signs reviewed and stable Respiratory status: spontaneous breathing, nonlabored ventilation, respiratory function stable and patient connected to nasal cannula oxygen Cardiovascular status: stable and blood pressure returned to baseline Anesthetic complications: no    Last Vitals:  Filed Vitals:   01/25/16 0628 01/25/16 1320  BP: 148/43 168/60  Pulse: 55 63  Temp:  36.8 C  Resp:  18    Last Pain: There were no vitals filed for this visit.               [place]

## 2016-01-25 NOTE — Progress Notes (Signed)
Pt systolic BP in the [id number]. Spoke with Dr.Sofia who gave an order for 5 mg of Hydralazine. Will continue to monitor

## 2016-01-25 NOTE — Progress Notes (Signed)
Pt given discharge instructions, prescriptions, and care notes. Pt verbalized understanding AEB no further questions or concerns at this time. IV was discontinued, no redness, pain, or swelling noted at this time. Telemetry discontinued and Centralized Telemetry was notified. Pt left the floor via wheelchair with staff in stable condition. 

## 2016-02-07 ENCOUNTER — Emergency Department (HOSPITAL_COMMUNITY): Payer: Medicare Other

## 2016-02-07 ENCOUNTER — Emergency Department (HOSPITAL_COMMUNITY)
Admission: EM | Admit: 2016-02-07 | Discharge: 2016-02-07 | Disposition: A | Discharge status: Home / Self Care | Payer: Medicare Other | Attending: Emergency Medicine | Admitting: Emergency Medicine

## 2016-02-07 ENCOUNTER — Encounter (HOSPITAL_COMMUNITY): Payer: Self-pay | Admitting: Nurse Practitioner

## 2016-02-07 DIAGNOSIS — Z853 Personal history of malignant neoplasm of breast: Secondary | ICD-10-CM | POA: Diagnosis not present

## 2016-02-07 DIAGNOSIS — M1612 Unilateral primary osteoarthritis, left hip: Secondary | ICD-10-CM | POA: Diagnosis not present

## 2016-02-07 DIAGNOSIS — S29001A Unspecified injury of muscle and tendon of front wall of thorax, initial encounter: Secondary | ICD-10-CM | POA: Insufficient documentation

## 2016-02-07 DIAGNOSIS — Z87891 Personal history of nicotine dependence: Secondary | ICD-10-CM | POA: Insufficient documentation

## 2016-02-07 DIAGNOSIS — E0500 Thyrotoxicosis with diffuse goiter without thyrotoxic crisis or storm: Secondary | ICD-10-CM | POA: Diagnosis not present

## 2016-02-07 DIAGNOSIS — Y92410 Unspecified street and highway as the place of occurrence of the external cause: Secondary | ICD-10-CM | POA: Insufficient documentation

## 2016-02-07 DIAGNOSIS — E119 Type 2 diabetes mellitus without complications: Secondary | ICD-10-CM | POA: Insufficient documentation

## 2016-02-07 DIAGNOSIS — I5031 Acute diastolic (congestive) heart failure: Secondary | ICD-10-CM | POA: Diagnosis not present

## 2016-02-07 DIAGNOSIS — Y998 Other external cause status: Secondary | ICD-10-CM | POA: Diagnosis not present

## 2016-02-07 DIAGNOSIS — Z8611 Personal history of tuberculosis: Secondary | ICD-10-CM | POA: Insufficient documentation

## 2016-02-07 DIAGNOSIS — I10 Essential (primary) hypertension: Secondary | ICD-10-CM | POA: Insufficient documentation

## 2016-02-07 DIAGNOSIS — S3991XA Unspecified injury of abdomen, initial encounter: Secondary | ICD-10-CM | POA: Insufficient documentation

## 2016-02-07 DIAGNOSIS — D649 Anemia, unspecified: Secondary | ICD-10-CM | POA: Insufficient documentation

## 2016-02-07 DIAGNOSIS — Z872 Personal history of diseases of the skin and subcutaneous tissue: Secondary | ICD-10-CM | POA: Diagnosis not present

## 2016-02-07 DIAGNOSIS — E785 Hyperlipidemia, unspecified: Secondary | ICD-10-CM | POA: Insufficient documentation

## 2016-02-07 DIAGNOSIS — Y9389 Activity, other specified: Secondary | ICD-10-CM | POA: Insufficient documentation

## 2016-02-07 DIAGNOSIS — Z79899 Other long term (current) drug therapy: Secondary | ICD-10-CM | POA: Insufficient documentation

## 2016-02-07 MED ORDER — HYDROCODONE-ACETAMINOPHEN 5-325 MG PO TABS
1.0000 | ORAL_TABLET | Freq: Once | ORAL | Status: AC
Start: 2016-02-07 — End: 2016-02-07
  Administered 2016-02-07: 1 via ORAL
  Filled 2016-02-07: qty 1

## 2016-02-07 MED ORDER — HYDROCODONE-ACETAMINOPHEN 5-325 MG PO TABS: 1.0000 | ORAL_TABLET | Freq: Four times a day (QID) | ORAL | Status: DC | PRN

## 2016-02-07 NOTE — ED Notes (Signed)
Pt ambulatory  To the room
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
## 2016-02-07 NOTE — ED Notes (Signed)
Attempted to call pt to update vital signs. Pt did not answer.

## 2016-02-07 NOTE — ED Notes (Signed)
Ambulated pt in hallway. Pt did great. No complaints of dizziness, SOB, or weakness.

## 2016-02-07 NOTE — ED Notes (Signed)
She ears a pad for urinary incontinence  Wet  New given
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
## 2016-02-22 ENCOUNTER — Other Ambulatory Visit: Payer: Self-pay | Admitting: Internal Medicine

## 2016-06-24 ENCOUNTER — Ambulatory Visit: Payer: Medicare Other | Admitting: Family

## 2016-06-24 ENCOUNTER — Other Ambulatory Visit (HOSPITAL_COMMUNITY): Payer: Medicare Other

## 2016-06-24 ENCOUNTER — Encounter (HOSPITAL_COMMUNITY): Payer: Medicare Other

## 2016-10-24 ENCOUNTER — Encounter (HOSPITAL_COMMUNITY): Payer: Self-pay

## 2016-10-24 ENCOUNTER — Observation Stay (HOSPITAL_COMMUNITY)
Admission: EM | Admit: 2016-10-24 | Discharge: 2016-10-25 | Disposition: A | Discharge status: Home / Self Care | Payer: Medicare Other | Attending: Internal Medicine | Admitting: Internal Medicine

## 2016-10-24 DIAGNOSIS — D649 Anemia, unspecified: Secondary | ICD-10-CM | POA: Diagnosis not present

## 2016-10-24 DIAGNOSIS — K31811 Angiodysplasia of stomach and duodenum with bleeding: Secondary | ICD-10-CM | POA: Diagnosis present

## 2016-10-24 DIAGNOSIS — E43 Unspecified severe protein-calorie malnutrition: Secondary | ICD-10-CM | POA: Insufficient documentation

## 2016-10-24 DIAGNOSIS — D509 Iron deficiency anemia, unspecified: Principal | ICD-10-CM | POA: Insufficient documentation

## 2016-10-24 DIAGNOSIS — Z853 Personal history of malignant neoplasm of breast: Secondary | ICD-10-CM | POA: Diagnosis not present

## 2016-10-24 DIAGNOSIS — N179 Acute kidney failure, unspecified: Secondary | ICD-10-CM | POA: Insufficient documentation

## 2016-10-24 DIAGNOSIS — I739 Peripheral vascular disease, unspecified: Secondary | ICD-10-CM | POA: Diagnosis present

## 2016-10-24 DIAGNOSIS — Z8719 Personal history of other diseases of the digestive system: Secondary | ICD-10-CM

## 2016-10-24 DIAGNOSIS — Z79899 Other long term (current) drug therapy: Secondary | ICD-10-CM | POA: Diagnosis not present

## 2016-10-24 DIAGNOSIS — K5520 Angiodysplasia of colon without hemorrhage: Secondary | ICD-10-CM | POA: Insufficient documentation

## 2016-10-24 DIAGNOSIS — I110 Hypertensive heart disease with heart failure: Secondary | ICD-10-CM | POA: Diagnosis not present

## 2016-10-24 DIAGNOSIS — Z681 Body mass index (BMI) 19 or less, adult: Secondary | ICD-10-CM | POA: Diagnosis not present

## 2016-10-24 DIAGNOSIS — I5032 Chronic diastolic (congestive) heart failure: Secondary | ICD-10-CM | POA: Diagnosis not present

## 2016-10-24 DIAGNOSIS — Z8711 Personal history of peptic ulcer disease: Secondary | ICD-10-CM | POA: Diagnosis not present

## 2016-10-24 DIAGNOSIS — Z87891 Personal history of nicotine dependence: Secondary | ICD-10-CM | POA: Insufficient documentation

## 2016-10-24 DIAGNOSIS — E118 Type 2 diabetes mellitus with unspecified complications: Secondary | ICD-10-CM | POA: Diagnosis not present

## 2016-10-24 DIAGNOSIS — Z95820 Peripheral vascular angioplasty status with implants and grafts: Secondary | ICD-10-CM | POA: Diagnosis not present

## 2016-10-24 DIAGNOSIS — K31819 Angiodysplasia of stomach and duodenum without bleeding: Secondary | ICD-10-CM | POA: Diagnosis not present

## 2016-10-24 DIAGNOSIS — I10 Essential (primary) hypertension: Secondary | ICD-10-CM | POA: Diagnosis present

## 2016-10-24 DIAGNOSIS — K551 Chronic vascular disorders of intestine: Secondary | ICD-10-CM | POA: Diagnosis present

## 2016-10-24 DIAGNOSIS — E119 Type 2 diabetes mellitus without complications: Secondary | ICD-10-CM

## 2016-10-24 DIAGNOSIS — E890 Postprocedural hypothyroidism: Secondary | ICD-10-CM | POA: Insufficient documentation

## 2016-10-24 DIAGNOSIS — E1151 Type 2 diabetes mellitus with diabetic peripheral angiopathy without gangrene: Secondary | ICD-10-CM | POA: Insufficient documentation

## 2016-10-24 DIAGNOSIS — E785 Hyperlipidemia, unspecified: Secondary | ICD-10-CM | POA: Diagnosis not present

## 2016-10-24 HISTORY — DX: Acute kidney failure, unspecified: N17.9

## 2016-10-24 LAB — CBC
HCT: 21.3 % — ABNORMAL LOW (ref 36.0–46.0)
HCT: 31.7 % — ABNORMAL LOW (ref 36.0–46.0)
Hemoglobin: 6.6 g/dL — CL (ref 12.0–15.0)
Hemoglobin: 10.5 g/dL — ABNORMAL LOW (ref 12.0–15.0)
MCH: 26.1 pg (ref 26.0–34.0)
MCH: 27.3 pg (ref 26.0–34.0)
MCHC: 31 g/dL (ref 30.0–36.0)
MCHC: 33.1 g/dL (ref 30.0–36.0)
MCV: 84.2 fL (ref 78.0–100.0)
MCV: 82.3 fL (ref 78.0–100.0)
Platelets: 259 10*3/uL (ref 150–400)
Platelets: 253 10*3/uL (ref 150–400)
RBC: 2.53 MIL/uL — ABNORMAL LOW (ref 3.87–5.11)
RBC: 3.85 MIL/uL — ABNORMAL LOW (ref 3.87–5.11)
RDW: 15.5 % (ref 11.5–15.5)
RDW: 14.3 % (ref 11.5–15.5)
WBC: 5.3 10*3/uL (ref 4.0–10.5)
WBC: 7.9 10*3/uL (ref 4.0–10.5)

## 2016-10-24 LAB — COMPREHENSIVE METABOLIC PANEL
ALT: 9 U/L — ABNORMAL LOW (ref 14–54)
AST: 17 U/L (ref 15–41)
Albumin: 3.2 g/dL — ABNORMAL LOW (ref 3.5–5.0)
Alkaline Phosphatase: 56 U/L (ref 38–126)
Anion gap: 10 (ref 5–15)
BUN: 31 mg/dL — ABNORMAL HIGH (ref 6–20)
CO2: 23 mmol/L (ref 22–32)
Calcium: 8.5 mg/dL — ABNORMAL LOW (ref 8.9–10.3)
Chloride: 105 mmol/L (ref 101–111)
Creatinine, Ser: 1.42 mg/dL — ABNORMAL HIGH (ref 0.44–1.00)
GFR calc Af Amer: 38 mL/min — ABNORMAL LOW (ref >60)
GFR calc non Af Amer: 33 mL/min — ABNORMAL LOW (ref >60)
Glucose, Bld: 115 mg/dL — ABNORMAL HIGH (ref 65–99)
Potassium: 3.5 mmol/L (ref 3.5–5.1)
Sodium: 138 mmol/L (ref 135–145)
Total Bilirubin: 0.4 mg/dL (ref 0.3–1.2)
Total Protein: 6.3 g/dL — ABNORMAL LOW (ref 6.5–8.1)

## 2016-10-24 LAB — PREPARE RBC (CROSSMATCH)

## 2016-10-24 LAB — MAGNESIUM: Magnesium: 2.2 mg/dL (ref 1.7–2.4)

## 2016-10-24 LAB — RETICULOCYTES
RBC.: 3.84 MIL/uL — ABNORMAL LOW (ref 3.87–5.11)
Retic Count, Absolute: 65.3 10*3/uL (ref 19.0–186.0)
Retic Ct Pct: 1.7 % (ref 0.4–3.1)

## 2016-10-24 LAB — CBG MONITORING, ED: Glucose-Capillary: 99 mg/dL (ref 65–99)

## 2016-10-24 LAB — GLUCOSE, CAPILLARY
Glucose-Capillary: 164 mg/dL — ABNORMAL HIGH (ref 65–99)
Glucose-Capillary: 77 mg/dL (ref 65–99)

## 2016-10-24 LAB — POC OCCULT BLOOD, ED: Fecal Occult Bld: POSITIVE — AB

## 2016-10-24 LAB — PHOSPHORUS: Phosphorus: 3.5 mg/dL (ref 2.5–4.6)

## 2016-10-24 MED ORDER — ONDANSETRON HCL 4 MG/2ML IJ SOLN: 4.0000 mg | Freq: Four times a day (QID) | INTRAMUSCULAR | Status: DC | PRN

## 2016-10-24 MED ORDER — LEVOTHYROXINE SODIUM 100 MCG IV SOLR
37.5000 ug | Freq: Every day | INTRAVENOUS | Status: DC
  Administered 2016-10-25: 37.5 ug via INTRAVENOUS
  Filled 2016-10-24: qty 5

## 2016-10-24 MED ORDER — ACETAMINOPHEN 650 MG RE SUPP: 650.0000 mg | Freq: Four times a day (QID) | RECTAL | Status: DC | PRN

## 2016-10-24 MED ORDER — HYDRALAZINE HCL 20 MG/ML IJ SOLN
10.0000 mg | Freq: Four times a day (QID) | INTRAMUSCULAR | Status: DC | PRN
  Administered 2016-10-24: 10 mg via INTRAVENOUS
  Filled 2016-10-24 (×2): qty 1

## 2016-10-24 MED ORDER — SODIUM CHLORIDE 0.9% FLUSH: 3.0000 mL | Freq: Two times a day (BID) | INTRAVENOUS | Status: DC

## 2016-10-24 MED ORDER — ACETAMINOPHEN 325 MG PO TABS: 650.0000 mg | ORAL_TABLET | Freq: Four times a day (QID) | ORAL | Status: DC | PRN

## 2016-10-24 MED ORDER — ONDANSETRON HCL 4 MG PO TABS: 4.0000 mg | ORAL_TABLET | Freq: Four times a day (QID) | ORAL | Status: DC | PRN

## 2016-10-24 MED ORDER — PANTOPRAZOLE SODIUM 40 MG IV SOLR
40.0000 mg | Freq: Two times a day (BID) | INTRAVENOUS | Status: DC
  Administered 2016-10-24 – 2016-10-25 (×3): 40 mg via INTRAVENOUS
  Filled 2016-10-24 (×3): qty 40

## 2016-10-24 MED ORDER — SODIUM CHLORIDE 0.9 % IV SOLN
INTRAVENOUS | Status: DC
  Administered 2016-10-24: 20:00:00 via INTRAVENOUS

## 2016-10-24 MED ORDER — SODIUM CHLORIDE 0.9 % IV SOLN: 10.0000 mL/h | Freq: Once | INTRAVENOUS | Status: DC

## 2016-10-24 MED ORDER — SODIUM CHLORIDE 0.9 % IV SOLN: INTRAVENOUS | Status: DC

## 2016-10-24 MED ORDER — LATANOPROST 0.005 % OP SOLN
1.0000 [drp] | Freq: Every day | OPHTHALMIC | Status: DC
  Administered 2016-10-24: 1 [drp] via OPHTHALMIC
  Filled 2016-10-24: qty 2.5

## 2016-10-24 MED ORDER — INSULIN ASPART 100 UNIT/ML ~~LOC~~ SOLN
0.0000 [IU] | SUBCUTANEOUS | Status: DC
  Administered 2016-10-24: 2 [IU] via SUBCUTANEOUS

## 2016-10-24 MED ORDER — DORZOLAMIDE HCL-TIMOLOL MAL 2-0.5 % OP SOLN
1.0000 [drp] | Freq: Two times a day (BID) | OPHTHALMIC | Status: DC
  Administered 2016-10-24 – 2016-10-25 (×2): 1 [drp] via OPHTHALMIC
  Filled 2016-10-24: qty 10

## 2016-10-24 NOTE — ED Triage Notes (Signed)
Patient states she was called by a doctor today after a check-up yesterday and was told to come to ED because something low in her blood. Alert and oriented, no complaints. NAD

## 2016-10-24 NOTE — Progress Notes (Signed)
Patient arrived on unit via stretcher from ED. No family at bedside.  2 unit of blood infusing.  Telemetry placed per MD order and CMT notified.
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
## 2016-10-24 NOTE — Progress Notes (Signed)
Not yet evaluated by GI and doubtful will undergo any procedures until tomorrow so will allow for clear liquids then NPO after MN  Erin Hearing, ANP

## 2016-10-24 NOTE — ED Notes (Signed)
Notified Dr. Stark Jock of hgb 6.6 called from lab

## 2016-10-24 NOTE — ED Notes (Signed)
Pt assisted with bedpan, pt clean and dry.
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
## 2016-10-25 ENCOUNTER — Encounter (HOSPITAL_COMMUNITY): Payer: Self-pay | Admitting: *Deleted

## 2016-10-25 ENCOUNTER — Observation Stay (HOSPITAL_COMMUNITY): Payer: Medicare Other | Admitting: Certified Registered"

## 2016-10-25 ENCOUNTER — Encounter (HOSPITAL_COMMUNITY)
Admission: EM | Disposition: A | Discharge status: Home / Self Care | Payer: Self-pay | Source: Home / Self Care | Attending: Family Medicine

## 2016-10-25 DIAGNOSIS — I110 Hypertensive heart disease with heart failure: Secondary | ICD-10-CM | POA: Diagnosis not present

## 2016-10-25 DIAGNOSIS — D649 Anemia, unspecified: Secondary | ICD-10-CM | POA: Diagnosis not present

## 2016-10-25 DIAGNOSIS — D509 Iron deficiency anemia, unspecified: Secondary | ICD-10-CM | POA: Diagnosis not present

## 2016-10-25 DIAGNOSIS — K31819 Angiodysplasia of stomach and duodenum without bleeding: Secondary | ICD-10-CM | POA: Diagnosis not present

## 2016-10-25 DIAGNOSIS — K5520 Angiodysplasia of colon without hemorrhage: Secondary | ICD-10-CM | POA: Diagnosis not present

## 2016-10-25 HISTORY — PX: ENTEROSCOPY: SHX5533

## 2016-10-25 LAB — TYPE AND SCREEN
Blood Product Expiration Date: 201802212359
Blood Product Expiration Date: 201802212359
ISSUE DATE / TIME: 201801251350
ISSUE DATE / TIME: 201801251649
Unit Type and Rh: 5100
Unit Type and Rh: 5100

## 2016-10-25 LAB — COMPREHENSIVE METABOLIC PANEL
ALT: 8 U/L — ABNORMAL LOW (ref 14–54)
AST: 18 U/L (ref 15–41)
Albumin: 2.6 g/dL — ABNORMAL LOW (ref 3.5–5.0)
Alkaline Phosphatase: 54 U/L (ref 38–126)
Anion gap: 8 (ref 5–15)
BUN: 23 mg/dL — ABNORMAL HIGH (ref 6–20)
CO2: 23 mmol/L (ref 22–32)
Calcium: 8.6 mg/dL — ABNORMAL LOW (ref 8.9–10.3)
Chloride: 110 mmol/L (ref 101–111)
Creatinine, Ser: 1.06 mg/dL — ABNORMAL HIGH (ref 0.44–1.00)
GFR calc Af Amer: 54 mL/min — ABNORMAL LOW (ref >60)
GFR calc non Af Amer: 47 mL/min — ABNORMAL LOW (ref >60)
Glucose, Bld: 88 mg/dL (ref 65–99)
Potassium: 3.5 mmol/L (ref 3.5–5.1)
Sodium: 141 mmol/L (ref 135–145)
Total Bilirubin: 0.6 mg/dL (ref 0.3–1.2)
Total Protein: 5.6 g/dL — ABNORMAL LOW (ref 6.5–8.1)

## 2016-10-25 LAB — IRON AND TIBC
Iron: 60 ug/dL (ref 28–170)
Saturation Ratios: 16 % (ref 10.4–31.8)
TIBC: 377 ug/dL (ref 250–450)
UIBC: 317 ug/dL

## 2016-10-25 LAB — CBC
HCT: 30.5 % — ABNORMAL LOW (ref 36.0–46.0)
Hemoglobin: 10 g/dL — ABNORMAL LOW (ref 12.0–15.0)
MCH: 26.9 pg (ref 26.0–34.0)
MCHC: 32.8 g/dL (ref 30.0–36.0)
MCV: 82 fL (ref 78.0–100.0)
Platelets: 241 10*3/uL (ref 150–400)
RBC: 3.72 MIL/uL — ABNORMAL LOW (ref 3.87–5.11)
RDW: 14.5 % (ref 11.5–15.5)
WBC: 6.3 10*3/uL (ref 4.0–10.5)

## 2016-10-25 LAB — FOLATE: Folate: 12.2 ng/mL (ref >5.9)

## 2016-10-25 LAB — VITAMIN B12: Vitamin B-12: 265 pg/mL (ref 180–914)

## 2016-10-25 LAB — GLUCOSE, CAPILLARY
Glucose-Capillary: 92 mg/dL (ref 65–99)
Glucose-Capillary: 86 mg/dL (ref 65–99)
Glucose-Capillary: 94 mg/dL (ref 65–99)
Glucose-Capillary: 194 mg/dL — ABNORMAL HIGH (ref 65–99)

## 2016-10-25 LAB — FERRITIN: Ferritin: 13 ng/mL (ref 11–307)

## 2016-10-25 LAB — TSH: TSH: 6.282 u[IU]/mL — ABNORMAL HIGH (ref 0.350–4.500)

## 2016-10-25 SURGERY — ENTEROSCOPY
Anesthesia: Monitor Anesthesia Care

## 2016-10-25 MED ORDER — LIDOCAINE HCL (CARDIAC) 20 MG/ML IV SOLN
INTRAVENOUS | Status: DC | PRN
  Administered 2016-10-25: 30 mg via INTRAVENOUS

## 2016-10-25 MED ORDER — LISINOPRIL 40 MG PO TABS
40.0000 mg | ORAL_TABLET | Freq: Every day | ORAL | Status: DC
  Administered 2016-10-25: 40 mg via ORAL
  Filled 2016-10-25: qty 1

## 2016-10-25 MED ORDER — CLONIDINE HCL 0.1 MG PO TABS
0.1000 mg | ORAL_TABLET | Freq: Once | ORAL | Status: AC
Start: 2016-10-25 — End: 2016-10-25
  Administered 2016-10-25: 0.1 mg via ORAL
  Filled 2016-10-25: qty 1

## 2016-10-25 MED ORDER — PROPOFOL 10 MG/ML IV BOLUS
INTRAVENOUS | Status: DC | PRN
  Administered 2016-10-25: 30 mg via INTRAVENOUS
  Administered 2016-10-25: 20 mg via INTRAVENOUS

## 2016-10-25 MED ORDER — PROPOFOL 500 MG/50ML IV EMUL
INTRAVENOUS | Status: DC | PRN
  Administered 2016-10-25: 25 ug/kg/min via INTRAVENOUS

## 2016-10-25 NOTE — Care Management Note (Signed)
Case Management Note  Patient Details  Name: Gina [name] MRN: [id number] Date of Birth: Sep 27, 1929  Subjective/Objective:        CM following for progression and d/c planning.             Action/Plan: 10/25/2016 Noted plan to d/c pt today,  IM not issued due to length of stay. No D/C needs identified.   Expected Discharge Date:  10/25/16               Expected Discharge Plan:  Home/Self Care  In-House Referral:  NA  Discharge planning Services  NA  Post Acute Care Choice:  NA Choice offered to:  NA  DME Arranged:  N/A DME Agency:  NA  HH Arranged:  NA HH Agency:  NA  Status of Service:  Completed, signed off  If discussed at [place] of Stay Meetings, dates discussed:    Additional Comments:  Adron Bene, RN 10/25/2016, 3:53 PM

## 2016-10-25 NOTE — Anesthesia Postprocedure Evaluation (Addendum)
Anesthesia Post Note  Patient: KEMIA WENDEL  Procedure(s) Performed: Procedure(s) (LRB): ENTEROSCOPY (N/A)  Patient location during evaluation: Endoscopy Anesthesia Type: MAC Level of consciousness: awake, awake and alert and oriented Pain management: pain level controlled Vital Signs Assessment: post-procedure vital signs reviewed and stable Respiratory status: spontaneous breathing, nonlabored ventilation, respiratory function stable and patient connected to nasal cannula oxygen Cardiovascular status: blood pressure returned to baseline Anesthetic complications: no       Last Vitals:  Vitals:   10/25/16 1043 10/25/16 1217  BP: (!) 193/48 (!) 164/55  Pulse: 62 61  Resp: 15   Temp: 36.7 C     Last Pain:  Vitals:   10/25/16 1043  TempSrc: Oral                 Bernie Fobes COKER

## 2016-10-25 NOTE — Progress Notes (Signed)
Patient discharge teaching given, including activity, diet, follow-up appoints, and medications. Patient verbalized understanding of all discharge instructions. IV access was d/c'd. Vitals are stable. Skin is intact except as charted in most recent assessments. Pt to be escorted out by RN, to be driven home by taxi.  Jillyn Ledger, MBA, BSN, RN

## 2016-10-25 NOTE — Discharge Summary (Signed)
[facility]  Physician Discharge Summary   [name] MRN: [id number] DOB/AGE: Mar 30, 1929 83 y.o.  Admit date: 10/24/2016 Discharge date: 10/25/2016  PCP: Rogers Blocker, MD  DISCHARGE DIAGNOSES:  Principal Problem:   Symptomatic anemia Active Problems:   HLD (hyperlipidemia)   Essential hypertension   PVD (peripheral vascular disease) (HCC)   Diabetes mellitus, type 2 (HCC)   Acute kidney injury ([place])   Protein-calorie malnutrition, severe (HCC)   His of mesenteric ischemia   Postablative hypothyroidism   Chronic diastolic congestive heart failure (HCC)   History of AVM (arteriovenous malformation) of duodenum, acquired with hemorrhage   Iron deficiency anemia   History of duodenal ulcer   RECOMMENDATIONS FOR OUTPATIENT FOLLOW UP: 1. Outpatient follow-up with gastroenterology in 2 weeks 2. PCP did do blood work to check hemoglobin next week 3. TSH noted to be mildly elevated. Recommend checking this in a few weeks on an outpatient basis.   DISCHARGE CONDITION: fair  Diet recommendation: As before  [facility] Weights   10/24/16 2022  Weight: 47 kg (103 lb 9.9 oz)    INITIAL HISTORY: 83 year old female who was found to be anemic by her primary care provider. She has a history of GI bleed due to AVMs in the past. Patient was sent over to the emergency department. She was transfused and admitted. She was seen by gastroenterology.  Consultations:  Gastroenterology: Dr. Benson Norway  Procedures: Small bowel enteroscopy Impression:               - Normal esophagus.                           - A single non-bleeding angiodysplastic lesion in                            the stomach. Treated with a monopolar probe.                           - Multiple non-bleeding angiodysplastic lesions in                            the jejunum. Treated with a monopolar probe.                           - No specimens collected.  HOSPITAL COURSE:   Symptomatic anemia/ Iron  deficiency anemia Baseline hemoglobin between 9 and 9.6. Hemoglobin at admission was 6.6. Patient stopped taking iron supplementation one month ago secondary to obstipation. She was transfused 2 units of blood. Hemoglobin has responded appropriately. She should continue iron supplementation at home.  Acute kidney injury  Patient was noted to have mildly elevated creatinine. Possibly related to her anemia and hypoperfusion. Her ACE inhibitor and diuretics were held. Creatinine has improved this morning. Follow-up as outpatient.   History of mesenteric ischemia No abdominal pain and no frank red blood per rectum  History of AVM (arteriovenous malformation) of duodenum, acquired with hemorrhage Seen by gastroenterology. Underwent small bowel enteroscopy. Small nonbleeding angiodysplastic lesion noted in the stomach treated with monopolar probe. Multiple lesions noted in jejunum also treated with monopolar probe.    History of duodenal ulcer Occurred in setting of NSAID use. Continue PPI.  Essential hypertension Blood Pressure was highin the setting of procedure. She may resume  her home medications.  Diabetes mellitus, type 2  Apparently diet controlled (not on medications prior to admission)  Chronic diastolic congestive heart failure  Clinically compensated  HLD (hyperlipidemia) May resume home medications  PVD (peripheral vascular disease)  Currently asymptomatic  Protein-calorie malnutrition, severe  Patient reports has been unable to gain weight and at home has consistently weighed 107 pounds. Skin 2. Discussed with primary care provider.  Postablative hypothyroidism  Continue Synthroid. TSH was checked here for unclear reason. 6.2. Will not make any changes to her medication at this time. PCP to recheck as outpatient.   Overall, stable. Patient tolerated the procedure well without any complications. She has ambulated. She wishes to go home. Okay for  discharge.   PERTINENT LABS:  The results of significant diagnostics from this hospitalization (including imaging, microbiology, ancillary and laboratory) are listed below for reference.     Labs: Basic Metabolic Panel:  Recent Labs Lab 10/24/16 1137 10/24/16 2241 10/25/16 0530  NA 138  --  141  K 3.5  --  3.5  CL 105  --  110  CO2 23  --  23  GLUCOSE 115*  --  88  BUN 31*  --  23*  CREATININE 1.42*  --  1.06*  CALCIUM 8.5*  --  8.6*  MG  --  2.2  --   PHOS  --  3.5  --    Liver Function Tests:  Recent Labs Lab 10/24/16 1137 10/25/16 0530  AST 17 18  ALT 9* 8*  ALKPHOS 56 54  BILITOT 0.4 0.6  PROT 6.3* 5.6*  ALBUMIN 3.2* 2.6*   CBC:  Recent Labs Lab 10/24/16 1137 10/24/16 2241 10/25/16 0530  WBC 5.3 7.9 6.3  HGB 6.6* 10.5* 10.0*  HCT 21.3* 31.7* 30.5*  MCV 84.2 82.3 82.0  PLT 259 253 241    CBG:  Recent Labs Lab 10/24/16 2013 10/24/16 2353 10/25/16 0413 10/25/16 0757 10/25/16 1139  GLUCAP 164* 77 92 86 94     IMAGING STUDIES No results found.  DISCHARGE EXAMINATION: Vitals:   10/25/16 1020 10/25/16 1025 10/25/16 1043 10/25/16 1217  BP: (!) 175/54  (!) 193/48 (!) 164/55  Pulse:  63 62 61  Resp:  12 15   Temp:   98 F (36.7 C)   TempSrc:   Oral   SpO2: 97% 100% 100%   Weight:      Height:       General appearance: alert, cooperative, appears stated age and no distress Resp: clear to auscultation bilaterally Cardio: regular rate and rhythm, S1, S2 normal, no murmur, click, rub or gallop GI: soft, non-tender; bowel sounds normal; no masses,  no organomegaly Extremities: extremities normal, atraumatic, no cyanosis or edema  DISPOSITION: Home with family  Discharge Instructions    Call MD for:  extreme fatigue    Complete by:  As directed    Call MD for:  persistant dizziness or light-headedness    Complete by:  As directed    Call MD for:  persistant nausea and vomiting    Complete by:  As directed    Call MD for:  severe  uncontrolled pain    Complete by:  As directed    Call MD for:  temperature >100.4    Complete by:  As directed    Diet - low sodium heart healthy    Complete by:  As directed    Discharge instructions    Complete by:  As directed    Please  be sure to follow up with your primary care provider in a few days to have blood work done. Follow-up with your gastroenterologist in 2 weeks.  You were cared for by a hospitalist during your hospital stay. If you have any questions about your discharge medications or the care you received while you were in the hospital after you are discharged, you can call the unit and asked to speak with the hospitalist on call if the hospitalist that took care of you is not available. Once you are discharged, your primary care physician will handle any further medical issues. Please note that NO REFILLS for any discharge medications will be authorized once you are discharged, as it is imperative that you return to your primary care physician (or establish a relationship with a primary care physician if you do not have one) for your aftercare needs so that they can reassess your need for medications and monitor your lab values. If you do not have a primary care physician, you can call [phone] for a physician referral.   Increase activity slowly    Complete by:  As directed       ALLERGIES: No Known Allergies   Current Discharge Medication List    CONTINUE these medications which have NOT CHANGED   Details  atorvastatin (LIPITOR) 20 MG tablet Take 1 tablet (20 mg total) by mouth daily at 6 PM. Qty: 90 tablet, Refills: 0   Associated Diagnoses: PVD (peripheral vascular disease) ([place])    calcium citrate-vitamin D (CITRACAL+D) 315-200 MG-UNIT per tablet Take 2 tablets by mouth 2 (two) times daily. Qty: 120 tablet, Refills: 11    cholecalciferol (VITAMIN D) 1000 UNITS tablet Take 1,000 Units by mouth daily.    diclofenac sodium (VOLTAREN) 1 % GEL Apply 2 g topically  4 (four) times daily. Qty: 1 Tube, Refills: 0   Associated Diagnoses: Rib pain on left side    dorzolamide-timolol (COSOPT) 22.3-6.8 MG/ML ophthalmic solution Place 1 drop into both eyes 2 (two) times daily.    Associated Diagnoses: Diabetes mellitus, type 2 (HCC)    furosemide (LASIX) 20 MG tablet TAKE 1 TABLET BY MOUTH EVERY DAY Qty: 30 tablet, Refills: 1    latanoprost (XALATAN) 0.005 % ophthalmic solution Place 1 drop into both eyes at bedtime.    levothyroxine (SYNTHROID, LEVOTHROID) 75 MCG tablet Take 1 tablet (75 mcg total) by mouth daily. Qty: 30 tablet, Refills: 1    lisinopril (PRINIVIL,ZESTRIL) 40 MG tablet Take 1 tablet (40 mg total) by mouth daily. Qty: 30 tablet, Refills: 0    Multiple Vitamin (MULTIVITAMIN WITH MINERALS) TABS tablet Take 1 tablet by mouth daily.    pantoprazole (PROTONIX) 40 MG tablet Take 1 tablet (40 mg total) by mouth 2 (two) times daily. Take twice daily for 1 month, and then daily indefinitely Qty: 120 tablet, Refills: 0    potassium chloride SA (K-DUR,KLOR-CON) 20 MEQ tablet Take 1 tablet (20 mEq total) by mouth daily. Qty: 30 tablet, Refills: 0    feeding supplement (BOOST / RESOURCE BREEZE) LIQD Take 1 Container by mouth 3 (three) times daily between meals. Qty: 90 Container, Refills: 0    ferrous sulfate 325 (65 FE) MG tablet Take 1 tablet (325 mg total) by mouth 2 (two) times daily with a meal. Qty: 90 tablet, Refills: 0    HYDROcodone-acetaminophen (NORCO) 5-325 MG tablet Take 1 tablet by mouth every 6 (six) hours as needed. Qty: 6 tablet, Refills: 0    senna (SENOKOT) 8.6 MG TABS tablet  Take 2 tablets (17.2 mg total) by mouth at bedtime. Qty: 60 each, Refills: 0    sucralfate (CARAFATE) 1 GM/10ML suspension Take 10 mLs (1 g total) by mouth 4 (four) times daily -  with meals and at bedtime. Qty: 420 mL, Refills: 0         Follow-up Information    HUNG,PATRICK D, MD. Schedule an appointment as soon as possible for a visit in 2  week(s).   Specialty:  Gastroenterology Contact information: [address], [address] [place] [address] [phone]        Rogers Blocker, MD. Schedule an appointment as soon as possible for a visit in 4 day(s).   Specialty:  Internal Medicine Why:  for blood work Contact information: [address] [place] [address] [phone]           [place]: 55 mins  [facility] Pager [phone]  10/25/2016, 4:13 PM

## 2016-10-25 NOTE — Transfer of Care (Signed)
Immediate Anesthesia Transfer of Care Note  Patient: Gina [name]  Procedure(s) Performed: Procedure(s): ENTEROSCOPY (N/A)  Patient Location: PACU  Anesthesia Type:General  Level of Consciousness: awake, alert , oriented and patient cooperative  Airway & Oxygen Therapy: Patient Spontanous Breathing and Patient connected to nasal cannula oxygen  Post-op Assessment: Report given to RN and Post -op Vital signs reviewed and stable  Post vital signs: Reviewed and stable  Last Vitals: 170/65, 72, 18, 96%  Vitals:   10/25/16 0921 10/25/16 1013  BP: (!) 189/33 (!) 173/68  Pulse: (!) 59 68  Resp: 10 15  Temp: 36.8 C     Last Pain:  Vitals:   10/25/16 0921  TempSrc: Oral         Complications: No apparent anesthesia complications

## 2016-10-25 NOTE — Anesthesia Preprocedure Evaluation (Signed)
Anesthesia Evaluation  Patient identified by MRN, date of birth, ID band Patient awake    Reviewed: Allergy & Precautions, NPO status , Patient's Chart, lab work & pertinent test results  Airway Mallampati: I  TM Distance: >3 FB Neck ROM: Full    Dental   Pulmonary COPD, Current Smoker,    Pulmonary exam normal        Cardiovascular hypertension, Pt. on medications Normal cardiovascular exam     Neuro/Psych    GI/Hepatic   Endo/Other  diabetes, Type 2, Oral Hypoglycemic Agents  Renal/GU      Musculoskeletal   Abdominal   Peds  Hematology   Anesthesia Other Findings   Reproductive/Obstetrics                             Anesthesia Physical Anesthesia Plan  ASA: III  Anesthesia Plan: MAC   Post-op Pain Management:    Induction: Intravenous  Airway Management Planned: Mask  Additional Equipment:   Intra-op Plan:   Post-operative Plan:   Informed Consent: I have reviewed the patients History and Physical, chart, labs and discussed the procedure including the risks, benefits and alternatives for the proposed anesthesia with the patient or authorized representative who has indicated his/her understanding and acceptance.     Plan Discussed with: CRNA and Surgeon  Anesthesia Plan Comments:         Anesthesia Quick Evaluation

## 2016-10-25 NOTE — Op Note (Signed)
[facility] Patient Name: Gina [name] Procedure Date : 10/25/2016 MRN: [id number] Attending MD: Carol Ada , MD Date of Birth: Jul 18, 1929 CSN: [id number] Age: 83 Admit Type: Inpatient Procedure:                Small bowel enteroscopy Indications:              Iron deficiency anemia Providers:                Carol Ada, MD, Burtis Junes, RN, Elspeth Cho                            Tech., Technician, Mardene Celeste "Trish" Durene Romans, CRNA Referring MD:              Medicines:                Propofol per Anesthesia Complications:            No immediate complications. Estimated Blood Loss:     Estimated blood loss: none. Procedure:                Pre-Anesthesia Assessment:                           - Prior to the procedure, a History and Physical                            was performed, and patient medications and                            allergies were reviewed. The patient's tolerance of                            previous anesthesia was also reviewed. The risks                            and benefits of the procedure and the sedation                            options and risks were discussed with the patient.                            All questions were answered, and informed consent                            was obtained. Prior Anticoagulants: The patient has                            taken no previous anticoagulant or antiplatelet                            agents. ASA Grade Assessment: III - A patient with                            severe systemic disease. After reviewing the risks  and benefits, the patient was deemed in                            satisfactory condition to undergo the procedure.                           - Sedation was administered by an anesthesia                            professional. Deep sedation was attained.                           After obtaining informed consent, the endoscope was   passed under direct vision. Throughout the                            procedure, the patient's blood pressure, pulse, and                            oxygen saturations were monitored continuously. The                            EC-[id number] ([id number]) scope was introduced through                            the mouth and advanced to the proximal jejunum. The                            small bowel enteroscopy was accomplished without                            difficulty. The patient tolerated the procedure                            well. Scope In: Scope Out: Findings:      The examined esophagus was normal.      A single 2 mm no bleeding angiodysplastic lesion was found at the       gastroesophageal junction. Coagulation for tissue destruction using       monopolar probe was successful. Estimated blood loss: none.      Multiple angiodysplastic lesions with no bleeding were found in the       proximal jejunum. Coagulation for tissue destruction using monopolar       probe was successful. Estimated blood loss: none. Impression:               - Normal esophagus.                           - A single non-bleeding angiodysplastic lesion in                            the stomach. Treated with a monopolar probe.                           - Multiple non-bleeding angiodysplastic lesions in  the jejunum. Treated with a monopolar probe.                           - No specimens collected. Recommendation:           - Return patient to hospital ward for ongoing care.                           - Resume regular diet.                           - Follow HGB.                           - Follow up in the office in 2 weeks.                           - Signing off. Procedure Code(s):        --- Professional ---                           [phone], Small intestinal endoscopy, enteroscopy                            beyond second portion of duodenum, not including                             ileum; with ablation of tumor(s), polyp(s), or                            other lesion(s) not amenable to removal by hot                            biopsy forceps, bipolar cautery or snare technique Diagnosis Code(s):        --- Professional ---                           N36.144, Angiodysplasia of stomach and duodenum                            without bleeding                           K55.20, Angiodysplasia of colon without hemorrhage                           D50.9, Iron deficiency anemia, unspecified CPT copyright 2016 American Medical Association. All rights reserved. The codes documented in this report are preliminary and upon coder review may  be revised to meet current compliance requirements. Carol Ada, MD Carol Ada, MD 10/25/2016 10:15:29 AM This report has been signed electronically. Number of Addenda: 0

## 2016-10-25 NOTE — Discharge Instructions (Signed)
Anemia, Nonspecific Anemia is a condition in which the concentration of red blood cells or hemoglobin in the blood is below normal. Hemoglobin is a substance in red blood cells that carries oxygen to the tissues of the body. Anemia results in not enough oxygen reaching these tissues. What are the causes? Common causes of anemia include:  Excessive bleeding. Bleeding may be internal or external. This includes excessive bleeding from periods (in women) or from the intestine.  Poor nutrition.  Chronic kidney, thyroid, and liver disease.  Bone marrow disorders that decrease red blood cell production.  Cancer and treatments for cancer.  HIV, AIDS, and their treatments.  Spleen problems that increase red blood cell destruction.  Blood disorders.  Excess destruction of red blood cells due to infection, medicines, and autoimmune disorders. What are the signs or symptoms?  Minor weakness.  Dizziness.  Headache.  Palpitations.  Shortness of breath, especially with exercise.  Paleness.  Cold sensitivity.  Indigestion.  Nausea.  Difficulty sleeping.  Difficulty concentrating. Symptoms may occur suddenly or they may develop slowly. How is this diagnosed? Additional blood tests are often needed. These help your health care provider determine the best treatment. Your health care provider will check your stool for blood and look for other causes of blood loss. How is this treated? Treatment varies depending on the cause of the anemia. Treatment can include:  Supplements of iron, vitamin B12, or folic acid.  Hormone medicines.  A blood transfusion. This may be needed if blood loss is severe.  Hospitalization. This may be needed if there is significant continual blood loss.  Dietary changes.  Spleen removal. Follow these instructions at home: Keep all follow-up appointments. It often takes many weeks to correct anemia, and having your health care provider check on your  condition and your response to treatment is very important. Get help right away if:  You develop extreme weakness, shortness of breath, or chest pain.  You become dizzy or have trouble concentrating.  You develop heavy vaginal bleeding.  You develop a rash.  You have bloody or black, tarry stools.  You faint.  You vomit up blood.  You vomit repeatedly.  You have abdominal pain.  You have a fever or persistent symptoms for more than 2-3 days.  You have a fever and your symptoms suddenly get worse.  You are dehydrated. This information is not intended to replace advice given to you by your health care provider. Make sure you discuss any questions you have with your health care provider. Document Released: 10/24/2004 Document Revised: 02/28/2016 Document Reviewed: 03/12/2013 Elsevier Interactive Patient Education  2017 [employer]  

## 2016-10-26 ENCOUNTER — Encounter (HOSPITAL_COMMUNITY): Payer: Self-pay | Admitting: Gastroenterology

## 2016-10-26 LAB — HEMOGLOBIN A1C
Hgb A1c MFr Bld: 6.2 % — ABNORMAL HIGH (ref 4.8–5.6)
Mean Plasma Glucose: 131 mg/dL

## 2017-01-06 ENCOUNTER — Other Ambulatory Visit (HOSPITAL_COMMUNITY): Payer: Self-pay | Admitting: *Deleted

## 2017-01-07 ENCOUNTER — Ambulatory Visit (HOSPITAL_COMMUNITY)
Admission: RE | Admit: 2017-01-07 | Discharge: 2017-01-07 | Disposition: A | Discharge status: Home / Self Care | Payer: Medicare Other | Source: Ambulatory Visit | Attending: Internal Medicine | Admitting: Internal Medicine

## 2017-01-07 DIAGNOSIS — D500 Iron deficiency anemia secondary to blood loss (chronic): Secondary | ICD-10-CM | POA: Diagnosis present

## 2017-01-07 LAB — PREPARE RBC (CROSSMATCH)

## 2017-01-07 MED ORDER — SODIUM CHLORIDE 0.9 % IV SOLN: Freq: Once | INTRAVENOUS | Status: DC

## 2017-01-08 LAB — TYPE AND SCREEN
ABO/RH(D): O POS
Antibody Screen: NEGATIVE
Unit division: 0
Unit division: 0

## 2017-01-08 LAB — BPAM RBC
Blood Product Expiration Date: 201805032359
Blood Product Expiration Date: 201805042359
ISSUE DATE / TIME: 201804100922
ISSUE DATE / TIME: 201804101144
Unit Type and Rh: 5100
Unit Type and Rh: 5100

## 2017-05-22 NOTE — Addendum Note (Signed)
Addendum  created 05/22/17 1019 by Roberts Gaudy, MD   Sign clinical note

## 2017-05-26 DIAGNOSIS — H401190 Primary open-angle glaucoma, unspecified eye, stage unspecified: Secondary | ICD-10-CM | POA: Insufficient documentation

## 2017-05-28 DIAGNOSIS — Z9889 Other specified postprocedural states: Secondary | ICD-10-CM | POA: Insufficient documentation

## 2017-07-10 DIAGNOSIS — Z961 Presence of intraocular lens: Secondary | ICD-10-CM | POA: Insufficient documentation

## 2017-11-19 ENCOUNTER — Ambulatory Visit (INDEPENDENT_AMBULATORY_CARE_PROVIDER_SITE_OTHER): Payer: Medicare Other | Admitting: Physician Assistant

## 2017-11-19 ENCOUNTER — Encounter (INDEPENDENT_AMBULATORY_CARE_PROVIDER_SITE_OTHER): Payer: Self-pay | Admitting: Physician Assistant

## 2017-11-19 VITALS — BP 198/65 | HR 75 | Temp 98.0°F | Resp 18 | Ht 67.0 in | Wt 120.0 lb

## 2017-11-19 DIAGNOSIS — I739 Peripheral vascular disease, unspecified: Secondary | ICD-10-CM

## 2017-11-19 DIAGNOSIS — K5900 Constipation, unspecified: Secondary | ICD-10-CM | POA: Diagnosis not present

## 2017-11-19 DIAGNOSIS — Z23 Encounter for immunization: Secondary | ICD-10-CM | POA: Diagnosis not present

## 2017-11-19 DIAGNOSIS — E039 Hypothyroidism, unspecified: Secondary | ICD-10-CM | POA: Diagnosis not present

## 2017-11-19 DIAGNOSIS — E119 Type 2 diabetes mellitus without complications: Secondary | ICD-10-CM | POA: Diagnosis not present

## 2017-11-19 DIAGNOSIS — E7841 Elevated Lipoprotein(a): Secondary | ICD-10-CM | POA: Diagnosis not present

## 2017-11-19 DIAGNOSIS — I10 Essential (primary) hypertension: Secondary | ICD-10-CM | POA: Diagnosis not present

## 2017-11-19 MED ORDER — AMLODIPINE BESYLATE 10 MG PO TABS: 10.0000 mg | ORAL_TABLET | Freq: Every day | ORAL | 3 refills | Status: DC

## 2017-11-19 MED ORDER — HYDROCHLOROTHIAZIDE 25 MG PO TABS: 25.0000 mg | ORAL_TABLET | Freq: Every day | ORAL | 3 refills | Status: DC

## 2017-11-19 MED ORDER — LOSARTAN POTASSIUM 50 MG PO TABS: 50.0000 mg | ORAL_TABLET | Freq: Every day | ORAL | 3 refills | Status: DC

## 2017-11-19 MED ORDER — LINACLOTIDE 145 MCG PO CAPS: 145.0000 ug | ORAL_CAPSULE | Freq: Every day | ORAL | 2 refills | Status: DC

## 2017-11-19 NOTE — Patient Instructions (Signed)

## 2017-11-19 NOTE — Progress Notes (Signed)
Subjective:  [name], female    DOB: [birth year]  Age: 84 y.o. MRN: [id number]  CC: HTN  HPI Gina [name] is a 84 y.o. female with a medical history of CHF, DM2, HTN, HLD, PVD,  Graves disease, TB lung (treated), breast cancer, gastric ulcer, mesenteric ischemia, AKI, open angle glaucoma, tobacco abuse, left rib fracture, and arthritis presents as a new patient for management of HTN. Has taken two anti-hypertensives but BP remained elevated. Does not endorse CP, palpitations, SOB, PND, orthopnea, presyncope, syncope, HA, weakness, paralysis, abdominal pain, f/c/n/v, rash, or GU sxs.     Complains of chronic constipation. Has tried several OTC products and drinks prune juice regularly with little to no relief of constipation. Last BM five days ago.       Outpatient Medications Prior to Visit  Medication Sig Dispense Refill  . atorvastatin (LIPITOR) 20 MG tablet Take 1 tablet (20 mg total) by mouth daily at 6 PM. 90 tablet 0  . calcium citrate-vitamin D (CITRACAL+D) 315-200 MG-UNIT per tablet Take 2 tablets by mouth 2 (two) times daily. 120 tablet 11  . cholecalciferol (VITAMIN D) 1000 UNITS tablet Take 1,000 Units by mouth daily.    . diclofenac sodium (VOLTAREN) 1 % GEL Apply 2 g topically 4 (four) times daily. (Patient taking differently: Apply 2 g topically as needed (for muscle pain). ) 1 Tube 0  . dorzolamide-timolol (COSOPT) 22.3-6.8 MG/ML ophthalmic solution Place 1 drop into both eyes 2 (two) times daily.     . feeding supplement (BOOST / RESOURCE BREEZE) LIQD Take 1 Container by mouth 3 (three) times daily between meals. (Patient not taking: Reported on 10/24/2016) 90 Container 0  . ferrous sulfate 325 (65 FE) MG tablet Take 1 tablet (325 mg total) by mouth 2 (two) times daily with a meal. (Patient not taking: Reported on 10/24/2016) 90 tablet 0  . furosemide (LASIX) 20 MG tablet TAKE 1 TABLET BY MOUTH EVERY DAY 30 tablet 1  . HYDROcodone-acetaminophen (NORCO)  5-325 MG tablet Take 1 tablet by mouth every 6 (six) hours as needed. (Patient not taking: Reported on 10/24/2016) 6 tablet 0  . latanoprost (XALATAN) 0.005 % ophthalmic solution Place 1 drop into both eyes at bedtime.    Marland Kitchen levothyroxine (SYNTHROID, LEVOTHROID) 75 MCG tablet Take 1 tablet (75 mcg total) by mouth daily. 30 tablet 1  . lisinopril (PRINIVIL,ZESTRIL) 40 MG tablet Take 1 tablet (40 mg total) by mouth daily. 30 tablet 0  . Multiple Vitamin (MULTIVITAMIN WITH MINERALS) TABS tablet Take 1 tablet by mouth daily.    . pantoprazole (PROTONIX) 40 MG tablet Take 1 tablet (40 mg total) by mouth 2 (two) times daily. Take twice daily for 1 month, and then daily indefinitely 120 tablet 0  . potassium chloride SA (K-DUR,KLOR-CON) 20 MEQ tablet Take 1 tablet (20 mEq total) by mouth daily. 30 tablet 0  . senna (SENOKOT) 8.6 MG TABS tablet Take 2 tablets (17.2 mg total) by mouth at bedtime. (Patient not taking: Reported on 10/24/2016) 60 each 0  . sucralfate (CARAFATE) 1 GM/10ML suspension Take 10 mLs (1 g total) by mouth 4 (four) times daily -  with meals and at bedtime. (Patient not taking: Reported on 10/24/2016) 420 mL 0   No facility-administered medications prior to visit.      ROS Review of Systems  Constitutional: Negative for chills, fever and malaise/fatigue.  Eyes: Negative for blurred vision.  Respiratory: Negative for shortness of breath.   Cardiovascular: Negative  for chest pain and palpitations.  Gastrointestinal: Positive for constipation. Negative for abdominal pain and nausea.  Genitourinary: Negative for dysuria and hematuria.  Musculoskeletal: Negative for joint pain and myalgias.       LE aches bilaterally  Skin: Negative for rash.  Neurological: Negative for tingling and headaches.  Psychiatric/Behavioral: Negative for depression. The patient is not nervous/anxious.     Objective:   Vitals:   11/19/17 1348  BP: (!) 198/65  Pulse: 75  Resp: 18  Temp: 98 F (36.7 C)   SpO2: 99%      Physical Exam  Constitutional: She is oriented to person, place, and time.  Elderly, frail, NAD, polite  HENT:  Head: Normocephalic and atraumatic.  Eyes: No scleral icterus.  Neck: Normal range of motion. Neck supple. No thyromegaly present.  Cardiovascular: Normal rate, regular rhythm and normal heart sounds.  Pulmonary/Chest: Effort normal and breath sounds normal.  Abdominal: Soft. Bowel sounds are normal. There is no tenderness.  Musculoskeletal: She exhibits no edema.  Neurological: She is alert and oriented to person, place, and time.  Skin: Skin is warm and dry. No rash noted. No erythema. No pallor.  Psychiatric: She has a normal mood and affect. Her behavior is normal. Thought content normal.  Vitals reviewed.    Assessment & Plan:    1. Hypertension, unspecified type - CBC with Differential - Comprehensive metabolic panel - Begin Amlodipine, HCTZ, and Losartan  2. Hypothyroidism, unspecified type - Thyroid Panel With TSH  3. Elevated lipoprotein(a) - Lipid panel  4. Type 2 diabetes mellitus without complication, without long-term current use of insulin (HCC) - HgB A1c 6.6% in clinic today  5. Constipation, unspecified constipation type - Has tried several OTC medications and regularly drinks prune juice with little to no relief of constipation.  - Begin Linzess 145 mcg  6. PVD (peripheral vascular disease) ([place]) - Ambulatory referral to Vascular Surgery  7. Need for Tdap vaccination - Administered Tdap in clinic today  Meds ordered this encounter  Medications  . linaclotide (LINZESS) 145 MCG CAPS capsule    Sig: Take 1 capsule (145 mcg total) by mouth daily before breakfast.    Dispense:  30 capsule    Refill:  2    Order Specific Question:   Supervising Provider    Answer:   Tresa Garter [id number]  . hydrochlorothiazide (HYDRODIURIL) 25 MG tablet    Sig: Take 1 tablet (25 mg total) by mouth daily.    Dispense:  90 tablet     Refill:  3    Order Specific Question:   Supervising Provider    Answer:   Tresa Garter [id number]  . amLODipine (NORVASC) 10 MG tablet    Sig: Take 1 tablet (10 mg total) by mouth daily.    Dispense:  90 tablet    Refill:  3    Order Specific Question:   Supervising Provider    Answer:   Tresa Garter [id number]  . losartan (COZAAR) 50 MG tablet    Sig: Take 1 tablet (50 mg total) by mouth daily.    Dispense:  90 tablet    Refill:  3    Order Specific Question:   Supervising Provider    Answer:   Tresa Garter [id number]    Follow-up: Return in about 4 weeks (around 12/17/2017) for HTN.   Clent Demark PA

## 2017-11-20 ENCOUNTER — Telehealth (INDEPENDENT_AMBULATORY_CARE_PROVIDER_SITE_OTHER): Payer: Self-pay | Admitting: Physician Assistant

## 2017-11-20 DIAGNOSIS — Z23 Encounter for immunization: Secondary | ICD-10-CM | POA: Diagnosis not present

## 2017-11-20 LAB — COMPREHENSIVE METABOLIC PANEL
ALT: 7 IU/L (ref 0–32)
AST: 15 IU/L (ref 0–40)
Albumin/Globulin Ratio: 1.4 (ref 1.2–2.2)
Albumin: 4.6 g/dL (ref 3.5–4.7)
Alkaline Phosphatase: 79 IU/L (ref 39–117)
BUN/Creatinine Ratio: 23 (ref 12–28)
BUN: 32 mg/dL — ABNORMAL HIGH (ref 8–27)
Bilirubin Total: 0.2 mg/dL (ref 0.0–1.2)
CO2: 20 mmol/L (ref 20–29)
Calcium: 9.3 mg/dL (ref 8.7–10.3)
Chloride: 103 mmol/L (ref 96–106)
Creatinine, Ser: 1.41 mg/dL — ABNORMAL HIGH (ref 0.57–1.00)
GFR calc Af Amer: 39 mL/min/{1.73_m2} — ABNORMAL LOW (ref >59)
GFR calc non Af Amer: 34 mL/min/{1.73_m2} — ABNORMAL LOW (ref >59)
Globulin, Total: 3.4 g/dL (ref 1.5–4.5)
Glucose: 98 mg/dL (ref 65–99)
Potassium: 4.6 mmol/L (ref 3.5–5.2)
Sodium: 139 mmol/L (ref 134–144)
Total Protein: 8 g/dL (ref 6.0–8.5)

## 2017-11-20 LAB — CBC WITH DIFFERENTIAL/PLATELET
Basophils Absolute: 0 10*3/uL (ref 0.0–0.2)
Basos: 1 %
EOS (ABSOLUTE): 0.1 10*3/uL (ref 0.0–0.4)
Eos: 2 %
Hematocrit: 24.6 % — ABNORMAL LOW (ref 34.0–46.6)
Hemoglobin: 7.4 g/dL — ABNORMAL LOW (ref 11.1–15.9)
Immature Grans (Abs): 0 10*3/uL (ref 0.0–0.1)
Immature Granulocytes: 0 %
Lymphocytes Absolute: 1.3 10*3/uL (ref 0.7–3.1)
Lymphs: 27 %
MCH: 23.2 pg — ABNORMAL LOW (ref 26.6–33.0)
MCHC: 30.1 g/dL — ABNORMAL LOW (ref 31.5–35.7)
MCV: 77 fL — ABNORMAL LOW (ref 79–97)
Monocytes Absolute: 0.3 10*3/uL (ref 0.1–0.9)
Monocytes: 6 %
Neutrophils Absolute: 3.1 10*3/uL (ref 1.4–7.0)
Neutrophils: 64 %
Platelets: 253 10*3/uL (ref 150–379)
RBC: 3.19 x10E6/uL — ABNORMAL LOW (ref 3.77–5.28)
RDW: 17.5 % — ABNORMAL HIGH (ref 12.3–15.4)
WBC: 4.8 10*3/uL (ref 3.4–10.8)

## 2017-11-20 LAB — LIPID PANEL
Chol/HDL Ratio: 2.3 ratio (ref 0.0–4.4)
Cholesterol, Total: 188 mg/dL (ref 100–199)
HDL: 83 mg/dL (ref >39)
LDL Calculated: 86 mg/dL (ref 0–99)
Triglycerides: 94 mg/dL (ref 0–149)
VLDL Cholesterol Cal: 19 mg/dL (ref 5–40)

## 2017-11-20 LAB — POCT GLYCOSYLATED HEMOGLOBIN (HGB A1C): Hemoglobin A1C: 6.6

## 2017-11-20 LAB — THYROID PANEL WITH TSH
Free Thyroxine Index: 2.9 (ref 1.2–4.9)
T3 Uptake Ratio: 29 % (ref 24–39)
T4, Total: 9.9 ug/dL (ref 4.5–12.0)
TSH: 4.6 u[IU]/mL — ABNORMAL HIGH (ref 0.450–4.500)

## 2017-11-20 NOTE — Telephone Encounter (Signed)
Pt friend like to know when since you in creese the dose of the med they need some advice about the med, please call her back

## 2017-11-21 NOTE — Telephone Encounter (Signed)
Need details while you have them on the phone. Please call back and find out what the question is.

## 2017-11-25 ENCOUNTER — Other Ambulatory Visit: Payer: Self-pay

## 2017-11-25 DIAGNOSIS — I6529 Occlusion and stenosis of unspecified carotid artery: Secondary | ICD-10-CM

## 2017-11-25 DIAGNOSIS — I739 Peripheral vascular disease, unspecified: Secondary | ICD-10-CM

## 2017-11-26 ENCOUNTER — Ambulatory Visit (INDEPENDENT_AMBULATORY_CARE_PROVIDER_SITE_OTHER): Payer: Medicare Other | Admitting: Physician Assistant

## 2017-12-08 ENCOUNTER — Other Ambulatory Visit: Payer: Self-pay

## 2017-12-08 ENCOUNTER — Encounter (HOSPITAL_COMMUNITY): Payer: Self-pay

## 2017-12-08 ENCOUNTER — Emergency Department (HOSPITAL_COMMUNITY)
Admission: EM | Admit: 2017-12-08 | Discharge: 2017-12-08 | Disposition: A | Discharge status: Home / Self Care | Payer: Medicare Other | Attending: Emergency Medicine | Admitting: Emergency Medicine

## 2017-12-08 DIAGNOSIS — I5032 Chronic diastolic (congestive) heart failure: Secondary | ICD-10-CM | POA: Diagnosis not present

## 2017-12-08 DIAGNOSIS — E039 Hypothyroidism, unspecified: Secondary | ICD-10-CM | POA: Diagnosis not present

## 2017-12-08 DIAGNOSIS — J449 Chronic obstructive pulmonary disease, unspecified: Secondary | ICD-10-CM | POA: Insufficient documentation

## 2017-12-08 DIAGNOSIS — E119 Type 2 diabetes mellitus without complications: Secondary | ICD-10-CM | POA: Diagnosis not present

## 2017-12-08 DIAGNOSIS — I110 Hypertensive heart disease with heart failure: Secondary | ICD-10-CM | POA: Insufficient documentation

## 2017-12-08 DIAGNOSIS — F17210 Nicotine dependence, cigarettes, uncomplicated: Secondary | ICD-10-CM | POA: Insufficient documentation

## 2017-12-08 DIAGNOSIS — R799 Abnormal finding of blood chemistry, unspecified: Secondary | ICD-10-CM | POA: Diagnosis present

## 2017-12-08 DIAGNOSIS — Z79899 Other long term (current) drug therapy: Secondary | ICD-10-CM | POA: Insufficient documentation

## 2017-12-08 DIAGNOSIS — D649 Anemia, unspecified: Secondary | ICD-10-CM | POA: Insufficient documentation

## 2017-12-08 LAB — COMPREHENSIVE METABOLIC PANEL
ALT: 10 U/L — ABNORMAL LOW (ref 14–54)
AST: 16 U/L (ref 15–41)
Albumin: 3.6 g/dL (ref 3.5–5.0)
Alkaline Phosphatase: 69 U/L (ref 38–126)
Anion gap: 10 (ref 5–15)
BUN: 41 mg/dL — ABNORMAL HIGH (ref 6–20)
CO2: 18 mmol/L — ABNORMAL LOW (ref 22–32)
Calcium: 9 mg/dL (ref 8.9–10.3)
Chloride: 110 mmol/L (ref 101–111)
Creatinine, Ser: 1.44 mg/dL — ABNORMAL HIGH (ref 0.44–1.00)
GFR calc Af Amer: 37 mL/min — ABNORMAL LOW (ref >60)
GFR calc non Af Amer: 32 mL/min — ABNORMAL LOW (ref >60)
Glucose, Bld: 116 mg/dL — ABNORMAL HIGH (ref 65–99)
Potassium: 3.7 mmol/L (ref 3.5–5.1)
Sodium: 138 mmol/L (ref 135–145)
Total Bilirubin: 0.4 mg/dL (ref 0.3–1.2)
Total Protein: 7 g/dL (ref 6.5–8.1)

## 2017-12-08 LAB — CBC
HCT: 25 % — ABNORMAL LOW (ref 36.0–46.0)
Hemoglobin: 7.6 g/dL — ABNORMAL LOW (ref 12.0–15.0)
MCH: 23.4 pg — ABNORMAL LOW (ref 26.0–34.0)
MCHC: 30.4 g/dL (ref 30.0–36.0)
MCV: 76.9 fL — ABNORMAL LOW (ref 78.0–100.0)
Platelets: 266 10*3/uL (ref 150–400)
RBC: 3.25 MIL/uL — ABNORMAL LOW (ref 3.87–5.11)
RDW: 18.2 % — ABNORMAL HIGH (ref 11.5–15.5)
WBC: 4.6 10*3/uL (ref 4.0–10.5)

## 2017-12-08 LAB — PREPARE RBC (CROSSMATCH)

## 2017-12-08 MED ORDER — SODIUM CHLORIDE 0.9 % IV SOLN
Freq: Once | INTRAVENOUS | Status: AC
  Administered 2017-12-08: 14:00:00 via INTRAVENOUS
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
## 2017-12-08 NOTE — ED Notes (Signed)
Pt and caregiver verbalized understanding of discharge instructions. 

## 2017-12-08 NOTE — ED Notes (Signed)
IV team at the bedside. 

## 2017-12-08 NOTE — ED Notes (Signed)
Family at bedside. 

## 2017-12-08 NOTE — ED Notes (Signed)
IV access attempted X2 without success. 

## 2017-12-08 NOTE — ED Triage Notes (Signed)
Pt states she is here because she needs a blood transfusion. Reports hx of the same. Reports feeling fatigued. Pt aoX4, no distress noted.

## 2017-12-08 NOTE — Discharge Instructions (Signed)
Take your iron as instructed  Return to ER as needed for new or worsening symptoms

## 2017-12-09 LAB — BPAM RBC
Blood Product Expiration Date: 201903152359
Blood Product Expiration Date: 201904012359
ISSUE DATE / TIME: 201903111358
ISSUE DATE / TIME: 201903111815
Unit Type and Rh: 5100
Unit Type and Rh: 5100

## 2017-12-09 LAB — TYPE AND SCREEN
ABO/RH(D): O POS
Antibody Screen: NEGATIVE
Unit division: 0
Unit division: 0

## 2017-12-15 ENCOUNTER — Other Ambulatory Visit (INDEPENDENT_AMBULATORY_CARE_PROVIDER_SITE_OTHER): Payer: Self-pay | Admitting: Physician Assistant

## 2017-12-15 NOTE — Telephone Encounter (Signed)
FWD to PCP. Dafne Nield S Jacquelin Krajewski, CMA  

## 2017-12-18 ENCOUNTER — Ambulatory Visit (INDEPENDENT_AMBULATORY_CARE_PROVIDER_SITE_OTHER): Payer: Medicare Other | Admitting: Physician Assistant

## 2018-01-07 ENCOUNTER — Encounter (INDEPENDENT_AMBULATORY_CARE_PROVIDER_SITE_OTHER): Payer: Self-pay | Admitting: Physician Assistant

## 2018-01-07 ENCOUNTER — Other Ambulatory Visit: Payer: Self-pay

## 2018-01-07 ENCOUNTER — Ambulatory Visit (INDEPENDENT_AMBULATORY_CARE_PROVIDER_SITE_OTHER): Payer: Medicare Other | Admitting: Physician Assistant

## 2018-01-07 VITALS — BP 211/67 | HR 60 | Temp 97.8°F | Resp 16 | Wt 118.8 lb

## 2018-01-07 DIAGNOSIS — I10 Essential (primary) hypertension: Secondary | ICD-10-CM | POA: Diagnosis not present

## 2018-01-07 DIAGNOSIS — K5900 Constipation, unspecified: Secondary | ICD-10-CM

## 2018-01-07 MED ORDER — LOSARTAN POTASSIUM 50 MG PO TABS: 50.0000 mg | ORAL_TABLET | Freq: Every day | ORAL | 3 refills | Status: DC

## 2018-01-07 MED ORDER — AMLODIPINE BESYLATE 10 MG PO TABS: 10.0000 mg | ORAL_TABLET | Freq: Every day | ORAL | 3 refills | Status: DC

## 2018-01-07 MED ORDER — HYDROCHLOROTHIAZIDE 25 MG PO TABS: 25.0000 mg | ORAL_TABLET | Freq: Every day | ORAL | 3 refills | Status: DC

## 2018-01-07 MED ORDER — LINACLOTIDE 145 MCG PO CAPS: ORAL_CAPSULE | ORAL | 6 refills | Status: DC

## 2018-01-07 NOTE — Progress Notes (Signed)
Subjective:  [name], female    DOB: 12/21/1928  Age: 84 y.o. MRN: [id number]  CC: HTN  HPI Gina [name] is a 84 y.o. female with a medical history of CHF, DM2, HTN, HLD, PVD,  Graves disease, TB lung (treated), breast cancer, gastric ulcer, mesenteric ischemia, AKI, open angle glaucoma, tobacco abuse, left rib fracture, and arthritis presents to f/u on HTN. Says her blood pressure was "in the 140s" until she began to take "pill pack" pills. BP increased back to "338S" systolic with pill pack pills. She brings her pill pack and the pack's information states these are pills prescribed three months ago by a previous provider. Pt is confused as to why she began taking the old pills again. Does not endorse CP, palpitations, SOB, HA, tingling, numbness, presyncope, syncope, PND, orthopnea, abdominal pain, f/c/n/v, rash, or GI/GU sxs.   Requests a refill of Linzess as this medication has been very effective in relieving her constipation. Already had six refills written for at previous visit but she states she was unaware.   Outpatient Medications Prior to Visit  Medication Sig Dispense Refill  . amLODipine (NORVASC) 10 MG tablet Take 1 tablet (10 mg total) by mouth daily. 90 tablet 3  . atorvastatin (LIPITOR) 20 MG tablet Take 1 tablet (20 mg total) by mouth daily at 6 PM. (Patient taking differently: Take 10 mg by mouth daily at 6 PM. ) 90 tablet 0  . dorzolamide-timolol (COSOPT) 22.3-6.8 MG/ML ophthalmic solution Place 1 drop into both eyes 2 (two) times daily.     . hydrochlorothiazide (HYDRODIURIL) 25 MG tablet Take 1 tablet (25 mg total) by mouth daily. 90 tablet 3  . latanoprost (XALATAN) 0.005 % ophthalmic solution Place 1 drop into both eyes at bedtime.    Marland Kitchen levothyroxine (SYNTHROID, LEVOTHROID) 75 MCG tablet Take 1 tablet (75 mcg total) by mouth daily. (Patient taking differently: Take 50 mcg by mouth daily. ) 30 tablet 1  . LINZESS 145 MCG CAPS capsule Take 1 capsule  (145 mcg total) by mouth daily before breakfast. 28 capsule 6  . losartan (COZAAR) 50 MG tablet Take 1 tablet (50 mg total) by mouth daily. 90 tablet 3  . Multiple Vitamin (MULTIVITAMIN WITH MINERALS) TABS tablet Take 1 tablet by mouth daily.    . prednisoLONE acetate (PRED FORTE) 1 % ophthalmic suspension Instill 1 drop into right eye 6 times a day for 1 week, then 5 times a day for 1 week, then 4 times daily for 1 week, then 3 times daily for  1  . RHOPRESSA 0.02 % SOLN Place 1 drop into both eyes at bedtime.  11   No facility-administered medications prior to visit.      ROS Review of Systems  Constitutional: Negative for chills, fever and malaise/fatigue.  Eyes: Negative for blurred vision.  Respiratory: Negative for shortness of breath.   Cardiovascular: Negative for chest pain and palpitations.  Gastrointestinal: Negative for abdominal pain and nausea.  Genitourinary: Negative for dysuria and hematuria.  Musculoskeletal: Negative for joint pain and myalgias.  Skin: Negative for rash.  Neurological: Negative for tingling and headaches.  Psychiatric/Behavioral: Negative for depression. The patient is not nervous/anxious.     Objective:  Wt 118 lb 12.8 oz (53.9 kg)   BMI 18.61 kg/m   Vitals:   01/07/18 1232  BP: (!) 211/67  Pulse: 60  Resp: 16  Temp: 97.8 F (36.6 C)  SpO2: 97%      Physical Exam  Constitutional: She is oriented to person, place, and time.  Elderly, frail, NAD, polite  HENT:  Head: Normocephalic and atraumatic.  Eyes: No scleral icterus.  Neck: Normal range of motion. Neck supple. No thyromegaly present.  Cardiovascular: Normal rate and regular rhythm.  2/6 systolic murmur. No carotid bruit bilaterally. No LE edema bilaterally.  Pulmonary/Chest: Effort normal and breath sounds normal.  Musculoskeletal: She exhibits no edema.  Neurological: She is alert and oriented to person, place, and time.  Skin: Skin is warm and dry. No rash noted. No  erythema. No pallor.  Psychiatric: She has a normal mood and affect. Her behavior is normal. Thought content normal.  Vitals reviewed.    Assessment & Plan:    1. Hypertension, unspecified type - Resume losartan (COZAAR) 50 MG tablet; Take 1 tablet (50 mg total) by mouth daily.  Dispense: 90 tablet; Refill: 3 - Resume hydrochlorothiazide (HYDRODIURIL) 25 MG tablet; Take 1 tablet (25 mg total) by mouth daily.  Dispense: 90 tablet; Refill: 3 - Resume amLODipine (NORVASC) 10 MG tablet; Take 1 tablet (10 mg total) by mouth daily.  Dispense: 90 tablet; Refill: 3  2. Constipation, unspecified constipation type - Resume linaclotide (LINZESS) 145 MCG CAPS capsule; Take 1 capsule (145 mcg total) by mouth daily before breakfast.  Dispense: 28 capsule; Refill: 6   Meds ordered this encounter  Medications  . losartan (COZAAR) 50 MG tablet    Sig: Take 1 tablet (50 mg total) by mouth daily.    Dispense:  90 tablet    Refill:  3    Order Specific Question:   Supervising Provider    Answer:   Tresa Garter [id number]  . hydrochlorothiazide (HYDRODIURIL) 25 MG tablet    Sig: Take 1 tablet (25 mg total) by mouth daily.    Dispense:  90 tablet    Refill:  3    Order Specific Question:   Supervising Provider    Answer:   Tresa Garter [id number]  . amLODipine (NORVASC) 10 MG tablet    Sig: Take 1 tablet (10 mg total) by mouth daily.    Dispense:  90 tablet    Refill:  3    Order Specific Question:   Supervising Provider    Answer:   Tresa Garter [id number]  . linaclotide (LINZESS) 145 MCG CAPS capsule    Sig: Take 1 capsule (145 mcg total) by mouth daily before breakfast.    Dispense:  28 capsule    Refill:  6    This prescription was filled on 12/15/2017. Any refills authorized will be placed on file.    Order Specific Question:   Supervising Provider    Answer:   Tresa Garter [[id number]]    Follow-up: Return in about 2 weeks (around 01/21/2018) for BP check.    Clent Demark PA

## 2018-01-07 NOTE — Patient Instructions (Signed)

## 2018-01-13 ENCOUNTER — Ambulatory Visit (INDEPENDENT_AMBULATORY_CARE_PROVIDER_SITE_OTHER)
Admission: RE | Admit: 2018-01-13 | Discharge: 2018-01-13 | Disposition: A | Discharge status: Home / Self Care | Payer: Medicare Other | Source: Ambulatory Visit | Attending: Vascular Surgery | Admitting: Vascular Surgery

## 2018-01-13 ENCOUNTER — Ambulatory Visit (HOSPITAL_COMMUNITY)
Admission: RE | Admit: 2018-01-13 | Discharge: 2018-01-13 | Disposition: A | Discharge status: Home / Self Care | Payer: Medicare Other | Source: Ambulatory Visit | Attending: Vascular Surgery | Admitting: Vascular Surgery

## 2018-01-13 DIAGNOSIS — I739 Peripheral vascular disease, unspecified: Secondary | ICD-10-CM | POA: Insufficient documentation

## 2018-01-13 DIAGNOSIS — I6529 Occlusion and stenosis of unspecified carotid artery: Secondary | ICD-10-CM

## 2018-01-15 ENCOUNTER — Ambulatory Visit: Payer: Medicare Other | Admitting: Family

## 2018-01-22 ENCOUNTER — Encounter (INDEPENDENT_AMBULATORY_CARE_PROVIDER_SITE_OTHER): Payer: Self-pay

## 2018-01-22 ENCOUNTER — Other Ambulatory Visit: Payer: Self-pay

## 2018-01-22 ENCOUNTER — Other Ambulatory Visit (INDEPENDENT_AMBULATORY_CARE_PROVIDER_SITE_OTHER): Payer: Medicare Other | Admitting: Physician Assistant

## 2018-01-22 VITALS — BP 212/48 | HR 53 | Temp 97.7°F | Wt 116.8 lb

## 2018-01-22 DIAGNOSIS — I10 Essential (primary) hypertension: Secondary | ICD-10-CM

## 2018-01-22 NOTE — Progress Notes (Signed)
Subjective:  [name], female    DOB: [birth year]  Age: 84 y.o. MRN: [id number]  CC: Blood pressure check  HPI Gina [name] is a 84 y.o. female with a medical history of CHF, DM2, HTN, HLD, PVD, Graves disease, TB lung (treated), breast cancer, gastric ulcer, mesenteric ischemia, AKI, open angle glaucoma, tobacco abuse, left rib fracture, and arthritis presents for a BP check. Had previously been taking old pill pack that included anti-hypertensives prescribed by outside provider. She is now taking anti-hypertensives prescribed here as directed. Notes blood pressure to be 062I systolic at home. No report on diastolic pressure. BP noted to be elevated with wide pulse pressure today. Pt says BP went up due to eating bacon which she eats often.  Pt asymptomatic and is feeling well. No complaints and says she is thankful for the medications she has been prescribed.       Outpatient Medications Prior to Visit  Medication Sig Dispense Refill  . amLODipine (NORVASC) 10 MG tablet Take 1 tablet (10 mg total) by mouth daily. 90 tablet 3  . atorvastatin (LIPITOR) 20 MG tablet Take 1 tablet (20 mg total) by mouth daily at 6 PM. (Patient taking differently: Take 10 mg by mouth daily at 6 PM. ) 90 tablet 0  . dorzolamide-timolol (COSOPT) 22.3-6.8 MG/ML ophthalmic solution Place 1 drop into both eyes 2 (two) times daily.     . hydrochlorothiazide (HYDRODIURIL) 25 MG tablet Take 1 tablet (25 mg total) by mouth daily. 90 tablet 3  . latanoprost (XALATAN) 0.005 % ophthalmic solution Place 1 drop into both eyes at bedtime.    Marland Kitchen levothyroxine (SYNTHROID, LEVOTHROID) 75 MCG tablet Take 1 tablet (75 mcg total) by mouth daily. (Patient taking differently: Take 50 mcg by mouth daily. ) 30 tablet 1  . linaclotide (LINZESS) 145 MCG CAPS capsule Take 1 capsule (145 mcg total) by mouth daily before breakfast. 28 capsule 6  . losartan (COZAAR) 50 MG tablet Take 1 tablet (50 mg total) by mouth daily.  90 tablet 3  . Multiple Vitamin (MULTIVITAMIN WITH MINERALS) TABS tablet Take 1 tablet by mouth daily.    . prednisoLONE acetate (PRED FORTE) 1 % ophthalmic suspension Instill 1 drop into right eye 6 times a day for 1 week, then 5 times a day for 1 week, then 4 times daily for 1 week, then 3 times daily for  1  . RHOPRESSA 0.02 % SOLN Place 1 drop into both eyes at bedtime.  11   No facility-administered medications prior to visit.      ROS Review of Systems  Constitutional: Negative for chills, fever and malaise/fatigue.  Eyes: Negative for blurred vision.  Respiratory: Negative for shortness of breath.   Cardiovascular: Negative for chest pain and palpitations.  Gastrointestinal: Negative for abdominal pain and nausea.  Genitourinary: Negative for dysuria and hematuria.  Musculoskeletal: Negative for joint pain and myalgias.  Skin: Negative for rash.  Neurological: Negative for tingling and headaches.  Psychiatric/Behavioral: Negative for depression. The patient is not nervous/anxious.     Objective:  BP (!) 212/48 (BP Location: Right Arm, Patient Position: Sitting, Cuff Size: Normal)   Pulse (!) 53   Temp 97.7 F (36.5 C) (Oral)   Wt 116 lb 12.8 oz (53 kg)   BMI 18.29 kg/m   BP/Weight 01/22/2018 01/07/2018 9/48/5462  Systolic BP 703 500 938  Diastolic BP 48 67 82  Wt. (Lbs) 116.8 118.8 120  BMI 18.29 18.61 18.79  Physical Exam  Constitutional: She is oriented to person, place, and time.  Elderly, frail, NAD, polite  HENT:  Head: Normocephalic and atraumatic.  Eyes: No scleral icterus.  Neck: Normal range of motion. Neck supple.  Cardiovascular: Normal rate, regular rhythm and intact distal pulses. Exam reveals no gallop and no friction rub.  Murmur (1/6 systolic murmur) heard. Pulmonary/Chest: Effort normal and breath sounds normal. No respiratory distress.  Musculoskeletal: She exhibits no edema.  Neurological: She is alert and oriented to person, place, and  time.  Skin: Skin is warm and dry. No rash noted. No erythema. No pallor.  Psychiatric: She has a normal mood and affect. Her behavior is normal. Thought content normal.  Vitals reviewed.    Assessment & Plan:    1. Hypertension, unspecified type - Continue on current anti-hypertensive regimen. - Ambulatory referral to Cardiology     Follow-up: PRN. Make appointment after cardiology consult/eval complete.  Clent Demark PA

## 2018-02-03 ENCOUNTER — Ambulatory Visit (INDEPENDENT_AMBULATORY_CARE_PROVIDER_SITE_OTHER): Payer: Medicare Other | Admitting: Family

## 2018-02-03 ENCOUNTER — Encounter: Payer: Self-pay | Admitting: Family

## 2018-02-03 ENCOUNTER — Other Ambulatory Visit: Payer: Self-pay

## 2018-02-03 VITALS — BP 179/61 | HR 47 | Resp 18 | Ht 67.0 in | Wt 120.3 lb

## 2018-02-03 DIAGNOSIS — Z87891 Personal history of nicotine dependence: Secondary | ICD-10-CM | POA: Diagnosis not present

## 2018-02-03 DIAGNOSIS — I6523 Occlusion and stenosis of bilateral carotid arteries: Secondary | ICD-10-CM

## 2018-02-03 DIAGNOSIS — I779 Disorder of arteries and arterioles, unspecified: Secondary | ICD-10-CM | POA: Diagnosis not present

## 2018-02-03 NOTE — Patient Instructions (Signed)
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
## 2018-02-12 ENCOUNTER — Telehealth (INDEPENDENT_AMBULATORY_CARE_PROVIDER_SITE_OTHER): Payer: Self-pay | Admitting: Physician Assistant

## 2018-02-12 NOTE — Telephone Encounter (Signed)
Pt's daughter called to request a medication refill on  -levothyroxine (SYNTHROID, LEVOTHROID) 75 MCG tablet  she states pt has always taken this medication and would like it sent to  -[facility]:[id number], [place], [place] [address] Last refill-06/30/15 If not able to fill appt has been scheduled

## 2018-02-13 NOTE — Telephone Encounter (Signed)
After researching who last Rx this medication for patient. Was informed that it was not Dr. Parks Neptune at [facility] endocrinology. Patient was never prescribed this medication by that provider. Was told by patients home care aid that patient was being given the medication by an Mont Dutton at [facility]. Called their office and found out she was actually a patient of [facility] and that is who last Rx levothyroxine on 08/11/17 with 3 refills.

## 2018-02-16 ENCOUNTER — Other Ambulatory Visit: Payer: Self-pay | Admitting: Nurse Practitioner

## 2018-02-16 NOTE — Telephone Encounter (Signed)
She needs to make a lab appointment for thyroid panel and she needs to bring all her medications with her for verification of medications and dates prescribed.

## 2018-02-17 NOTE — Telephone Encounter (Signed)
Patient will come in tomorrow after 1:30 pm. Nat Christen, CMA

## 2018-02-18 ENCOUNTER — Other Ambulatory Visit (INDEPENDENT_AMBULATORY_CARE_PROVIDER_SITE_OTHER): Payer: Medicare Other

## 2018-02-18 DIAGNOSIS — E039 Hypothyroidism, unspecified: Secondary | ICD-10-CM

## 2018-02-18 NOTE — Progress Notes (Signed)
Unable to obtain specimen. Pt stated she will come back tomorrow. Nat Christen, CMA

## 2018-02-25 LAB — THYROID PANEL WITH TSH
Free Thyroxine Index: 0.5 — ABNORMAL LOW (ref 1.2–4.9)
T3 Uptake Ratio: 17 % — ABNORMAL LOW (ref 24–39)
T4, Total: 2.8 ug/dL — ABNORMAL LOW (ref 4.5–12.0)
TSH: 135.9 u[IU]/mL — ABNORMAL HIGH (ref 0.450–4.500)

## 2018-02-26 ENCOUNTER — Other Ambulatory Visit: Payer: Self-pay | Admitting: Nurse Practitioner

## 2018-02-26 MED ORDER — LEVOTHYROXINE SODIUM 88 MCG PO TABS: 88.0000 ug | ORAL_TABLET | Freq: Every day | ORAL | 3 refills | Status: DC

## 2018-02-26 MED ORDER — LEVOTHYROXINE SODIUM 88 MCG PO TABS: 88.0000 ug | ORAL_TABLET | Freq: Every day | ORAL | 0 refills | Status: DC

## 2018-02-27 ENCOUNTER — Telehealth (INDEPENDENT_AMBULATORY_CARE_PROVIDER_SITE_OTHER): Payer: Self-pay

## 2018-02-27 NOTE — Telephone Encounter (Signed)
-----   Message from Gildardo Pounds, NP sent at 02/26/2018 10:56 PM EDT ----- Thyroid levels are extremely elevated. Will send synthroid to [facility]. She needs to make a follow up lab appointment for 6 weeks repeat TSH

## 2018-02-27 NOTE — Telephone Encounter (Signed)
Patient is aware of thyroid levels being extremely elevated and synthroid being sent to [facility]. She is aware to return in 6 weeks for recheck of TSH. Nat Christen, CMA

## 2018-03-10 ENCOUNTER — Ambulatory Visit (INDEPENDENT_AMBULATORY_CARE_PROVIDER_SITE_OTHER): Payer: Medicare Other | Admitting: Physician Assistant

## 2018-03-10 ENCOUNTER — Other Ambulatory Visit: Payer: Self-pay

## 2018-03-10 ENCOUNTER — Encounter (INDEPENDENT_AMBULATORY_CARE_PROVIDER_SITE_OTHER): Payer: Self-pay | Admitting: Physician Assistant

## 2018-03-10 VITALS — BP 228/77 | HR 59 | Temp 98.0°F | Ht 67.0 in | Wt 121.4 lb

## 2018-03-10 DIAGNOSIS — I10 Essential (primary) hypertension: Secondary | ICD-10-CM

## 2018-03-10 NOTE — Patient Instructions (Addendum)
Your blood pressure is too high and you are at risk for a heart attack and/or stroke. You have declined treatment with clonidine in clinic today. Therefor I advise that you go to the urgent care or emergency department to safely lower your blood pressure. Please take your blood pressure medicines as directed.     Be aware of red flag symptoms to include chest pain, shortness of breath, sweating, nausea, vomiting, jaw pain, back pain, arm pain, tingling, numbness, slurred speech, confusion, weakness, or any other worrisome symptoms. Please seek immediate medical care should you notice any of these symptoms.

## 2018-03-10 NOTE — Progress Notes (Addendum)
Subjective:  [name], female    DOB: 1929/02/03  Age: 84 y.o. MRN: [id number]  CC: medication management  HPI  Gina [name] a 84 y.o.femalewith a medical history of CHF, DM2, HTN, HLD, PVD, Graves disease, TB lung (treated), breast cancer, gastric ulcer, mesenteric ischemia, AKI, open angle glaucoma, tobacco abuse, left rib fracture, and arthritis presents to f/u on HTN. Says she switched pharmacies and has not been receiving all her medications. She has been receiving bottles and pill packs at different times and is confused as to what she should take. Friend of the family accompanies patient today and verifies that Gina [name] will now send all her pills together in one pill pack so as to avoid confusion. Review of current pill packs that were brought to clinic shows patient has not been taking antihypertensives as directed. Patient admits to drinking Pepsi and eating bacon regularly. Does not endorse any symptoms or complaints.      Outpatient Medications Prior to Visit  Medication Sig Dispense Refill  . amLODipine (NORVASC) 10 MG tablet Take 1 tablet (10 mg total) by mouth daily. 90 tablet 3  . dorzolamide-timolol (COSOPT) 22.3-6.8 MG/ML ophthalmic solution Place 1 drop into both eyes 2 (two) times daily.     . hydrochlorothiazide (HYDRODIURIL) 25 MG tablet Take 1 tablet (25 mg total) by mouth daily. 90 tablet 3  . latanoprost (XALATAN) 0.005 % ophthalmic solution Place 1 drop into both eyes at bedtime.    Marland Kitchen levothyroxine (SYNTHROID, LEVOTHROID) 88 MCG tablet Take 1 tablet (88 mcg total) by mouth daily. 60 tablet 0  . linaclotide (LINZESS) 145 MCG CAPS capsule Take 1 capsule (145 mcg total) by mouth daily before breakfast. 28 capsule 6  . losartan (COZAAR) 50 MG tablet Take 1 tablet (50 mg total) by mouth daily. 90 tablet 3  . prednisoLONE acetate (PRED FORTE) 1 % ophthalmic suspension Instill 1 drop into right eye 6 times a day for 1 week, then 5 times a  day for 1 week, then 4 times daily for 1 week, then 3 times daily for  1  . RHOPRESSA 0.02 % SOLN Place 1 drop into both eyes at bedtime.  11  . atorvastatin (LIPITOR) 20 MG tablet Take 1 tablet (20 mg total) by mouth daily at 6 PM. (Patient not taking: Reported on 03/10/2018) 90 tablet 0  . Multiple Vitamin (MULTIVITAMIN WITH MINERALS) TABS tablet Take 1 tablet by mouth daily. (Patient not taking: Reported on 02/18/2018)     No facility-administered medications prior to visit.      ROS Review of Systems  Constitutional: Negative for chills, fever and malaise/fatigue.  Eyes: Negative for blurred vision.  Respiratory: Negative for shortness of breath.   Cardiovascular: Negative for chest pain and palpitations.  Gastrointestinal: Negative for abdominal pain and nausea.  Genitourinary: Negative for dysuria and hematuria.  Musculoskeletal: Negative for joint pain and myalgias.  Skin: Negative for rash.  Neurological: Negative for tingling and headaches.  Psychiatric/Behavioral: Negative for depression. The patient is not nervous/anxious.     Objective:  BP (!) 228/77 (BP Location: Right Arm, Patient Position: Sitting, Cuff Size: Small)   Pulse (!) 59   Temp 98 F (36.7 C) (Oral)   Ht 5\' 7"  (1.702 m)   Wt 121 lb 6.4 oz (55.1 kg)   SpO2 97%   BMI 19.01 kg/m   BP/Weight 03/10/2018 02/03/2018 6/57/8469  Systolic BP 629 528 413  Diastolic BP 77 61 48  Wt. (Lbs)  121.4 120.3 116.8  BMI 19.01 18.84 18.29      Physical Exam  Constitutional: She is oriented to person, place, and time.  Elderly, frail, NAD, polite  HENT:  Head: Normocephalic and atraumatic.  Eyes: No scleral icterus.  Neck: Normal range of motion. Neck supple. No thyromegaly present.  Cardiovascular: Normal rate and regular rhythm.  Murmur (2/6 systolic ) heard. Pulmonary/Chest: Effort normal and breath sounds normal.  Abdominal: Soft. Bowel sounds are normal. There is no tenderness.  Musculoskeletal: She exhibits  no edema.  Neurological: She is alert and oriented to person, place, and time.  Skin: Skin is warm and dry. No rash noted. No erythema. No pallor.  Psychiatric: She has a normal mood and affect. Her behavior is normal. Thought content normal.  Vitals reviewed.    Assessment & Plan:   1. Uncontrolled hypertension - Pt not taking medications as directed. There is also confusion as to which pills to take e.g. Bottle vs pill pack. Patient also counseled on dietary indiscretion; asked to stop consuming Pepsi and eating bacon regularly. [facility] will be sending all her pills in clearly marked pill pack on the next shipments according to family friend that was present during interview.  I have also written the following on her AVS and read it to her:     Your blood pressure is too high and you are at risk for a heart attack and/or stroke. You have declined treatment with clonidine in clinic today. Therefor I advise that you go to the urgent care or emergency department to safely lower your blood pressure. Please take your blood pressure medicines as directed.     Be aware of red flag symptoms to include chest pain, shortness of breath, sweating, nausea, vomiting, jaw pain, back pain, arm pain, tingling, numbness, slurred speech, confusion, weakness, or any other worrisome symptoms. Please seek immediate medical care should you notice any of these symptoms.    Follow-up: Return in about 1 month (around 04/07/2018) for HTN.   Clent Demark PA

## 2018-03-13 ENCOUNTER — Other Ambulatory Visit: Payer: Self-pay

## 2018-03-13 DIAGNOSIS — I779 Disorder of arteries and arterioles, unspecified: Secondary | ICD-10-CM

## 2018-05-06 ENCOUNTER — Other Ambulatory Visit: Payer: Self-pay

## 2018-05-06 ENCOUNTER — Encounter (INDEPENDENT_AMBULATORY_CARE_PROVIDER_SITE_OTHER): Payer: Self-pay | Admitting: Physician Assistant

## 2018-05-06 ENCOUNTER — Telehealth (INDEPENDENT_AMBULATORY_CARE_PROVIDER_SITE_OTHER): Payer: Self-pay | Admitting: Physician Assistant

## 2018-05-06 ENCOUNTER — Ambulatory Visit (INDEPENDENT_AMBULATORY_CARE_PROVIDER_SITE_OTHER): Payer: Medicare Other | Admitting: Physician Assistant

## 2018-05-06 ENCOUNTER — Other Ambulatory Visit (INDEPENDENT_AMBULATORY_CARE_PROVIDER_SITE_OTHER): Payer: Self-pay | Admitting: Physician Assistant

## 2018-05-06 VITALS — BP 200/62 | HR 58 | Temp 98.1°F | Ht 67.0 in | Wt 118.6 lb

## 2018-05-06 DIAGNOSIS — N3000 Acute cystitis without hematuria: Secondary | ICD-10-CM | POA: Diagnosis not present

## 2018-05-06 DIAGNOSIS — I739 Peripheral vascular disease, unspecified: Secondary | ICD-10-CM

## 2018-05-06 DIAGNOSIS — I10 Essential (primary) hypertension: Secondary | ICD-10-CM | POA: Diagnosis not present

## 2018-05-06 DIAGNOSIS — R4189 Other symptoms and signs involving cognitive functions and awareness: Secondary | ICD-10-CM

## 2018-05-06 DIAGNOSIS — R350 Frequency of micturition: Secondary | ICD-10-CM

## 2018-05-06 LAB — POCT URINALYSIS DIPSTICK
Bilirubin, UA: NEGATIVE
Blood, UA: POSITIVE
Glucose, UA: NEGATIVE
Ketones, UA: NEGATIVE
Nitrite, UA: NEGATIVE
Protein, UA: POSITIVE — AB
Spec Grav, UA: 1.015 (ref 1.010–1.025)
Urobilinogen, UA: 0.2 E.U./dL
pH, UA: 5.5 (ref 5.0–8.0)

## 2018-05-06 MED ORDER — ATORVASTATIN CALCIUM 20 MG PO TABS: 20.0000 mg | ORAL_TABLET | Freq: Every day | ORAL | 1 refills | Status: DC

## 2018-05-06 MED ORDER — CIPROFLOXACIN HCL 500 MG PO TABS: 500.0000 mg | ORAL_TABLET | Freq: Two times a day (BID) | ORAL | 0 refills | Status: AC

## 2018-05-06 NOTE — Telephone Encounter (Signed)
Refilled. Please notify patient.

## 2018-05-06 NOTE — Telephone Encounter (Signed)
Patient care giver called to request medication refill for atorvastatin (LIPITOR) 20 MG tablet   Patient uses [facility] [place], [place], [place]  Please advice [phone]  Thank you Emmit Pomfret

## 2018-05-06 NOTE — Progress Notes (Signed)
Frequent urination and shakes really bad

## 2018-05-06 NOTE — Patient Instructions (Signed)
You must go to the ED to have your blood pressure lowered gradually. Then you must go to the pharmacy and ask for clarification of how to take your pills. I have reached out to Eden Lathe and we will work on getting supervision for you in regards to taking your pills. You will also receive a call in 2-3 weeks for a neurology consult.     Managing Your Hypertension Hypertension is commonly called high blood pressure. This is when the force of your blood pressing against the walls of your arteries is too strong. Arteries are blood vessels that carry blood from your heart throughout your body. Hypertension forces the heart to work harder to pump blood, and may cause the arteries to become narrow or stiff. Having untreated or uncontrolled hypertension can cause heart attack, stroke, kidney disease, and other problems. What are blood pressure readings? A blood pressure reading consists of a higher number over a lower number. Ideally, your blood pressure should be below 120/80. The first ("top") number is called the systolic pressure. It is a measure of the pressure in your arteries as your heart beats. The second ("bottom") number is called the diastolic pressure. It is a measure of the pressure in your arteries as the heart relaxes. What does my blood pressure reading mean? Blood pressure is classified into four stages. Based on your blood pressure reading, your health care provider may use the following stages to determine what type of treatment you need, if any. Systolic pressure and diastolic pressure are measured in a unit called mm Hg. Normal  Systolic pressure: below 220.  Diastolic pressure: below 80. Elevated  Systolic pressure: 254-270.  Diastolic pressure: below 80. Hypertension stage 1  Systolic pressure: 623-762.  Diastolic pressure: 83-15. Hypertension stage 2  Systolic pressure: 176 or above.  Diastolic pressure: 90 or above. What health risks are associated with  hypertension? Managing your hypertension is an important responsibility. Uncontrolled hypertension can lead to:  A heart attack.  A stroke.  A weakened blood vessel (aneurysm).  Heart failure.  Kidney damage.  Eye damage.  Metabolic syndrome.  Memory and concentration problems.  What changes can I make to manage my hypertension? Hypertension can be managed by making lifestyle changes and possibly by taking medicines. Your health care provider will help you make a plan to bring your blood pressure within a normal range. Eating and drinking  Eat a diet that is high in fiber and potassium, and low in salt (sodium), added sugar, and fat. An example eating plan is called the DASH (Dietary Approaches to Stop Hypertension) diet. To eat this way: ? Eat plenty of fresh fruits and vegetables. Try to fill half of your plate at each meal with fruits and vegetables. ? Eat whole grains, such as whole wheat pasta, brown rice, or whole grain bread. Fill about one quarter of your plate with whole grains. ? Eat low-fat diary products. ? Avoid fatty cuts of meat, processed or cured meats, and poultry with skin. Fill about one quarter of your plate with lean proteins such as fish, chicken without skin, beans, eggs, and tofu. ? Avoid premade and processed foods. These tend to be higher in sodium, added sugar, and fat.  Reduce your daily sodium intake. Most people with hypertension should eat less than 1,500 mg of sodium a day.  Limit alcohol intake to no more than 1 drink a day for nonpregnant women and 2 drinks a day for men. One drink equals 12 oz of  beer, 5 oz of wine, or 1 oz of hard liquor. Lifestyle  Work with your health care provider to maintain a healthy body weight, or to lose weight. Ask what an ideal weight is for you.  Get at least 30 minutes of exercise that causes your heart to beat faster (aerobic exercise) most days of the week. Activities may include walking, swimming, or  biking.  Include exercise to strengthen your muscles (resistance exercise), such as weight lifting, as part of your weekly exercise routine. Try to do these types of exercises for 30 minutes at least 3 days a week.  Do not use any products that contain nicotine or tobacco, such as cigarettes and e-cigarettes. If you need help quitting, ask your health care provider.  Control any long-term (chronic) conditions you have, such as high cholesterol or diabetes. Monitoring  Monitor your blood pressure at home as told by your health care provider. Your personal target blood pressure may vary depending on your medical conditions, your age, and other factors.  Have your blood pressure checked regularly, as often as told by your health care provider. Working with your health care provider  Review all the medicines you take with your health care provider because there may be side effects or interactions.  Talk with your health care provider about your diet, exercise habits, and other lifestyle factors that may be contributing to hypertension.  Visit your health care provider regularly. Your health care provider can help you create and adjust your plan for managing hypertension. Will I need medicine to control my blood pressure? Your health care provider may prescribe medicine if lifestyle changes are not enough to get your blood pressure under control, and if:  Your systolic blood pressure is 130 or higher.  Your diastolic blood pressure is 80 or higher.  Take medicines only as told by your health care provider. Follow the directions carefully. Blood pressure medicines must be taken as prescribed. The medicine does not work as well when you skip doses. Skipping doses also puts you at risk for problems. Contact a health care provider if:  You think you are having a reaction to medicines you have taken.  You have repeated (recurrent) headaches.  You feel dizzy.  You have swelling in your  ankles.  You have trouble with your vision. Get help right away if:  You develop a severe headache or confusion.  You have unusual weakness or numbness, or you feel faint.  You have severe pain in your chest or abdomen.  You vomit repeatedly.  You have trouble breathing. Summary  Hypertension is when the force of blood pumping through your arteries is too strong. If this condition is not controlled, it may put you at risk for serious complications.  Your personal target blood pressure may vary depending on your medical conditions, your age, and other factors. For most people, a normal blood pressure is less than 120/80.  Hypertension is managed by lifestyle changes, medicines, or both. Lifestyle changes include weight loss, eating a healthy, low-sodium diet, exercising more, and limiting alcohol. This information is not intended to replace advice given to you by your health care provider. Make sure you discuss any questions you have with your health care provider. Document Released: 06/10/2012 Document Revised: 08/14/2016 Document Reviewed: 08/14/2016 Elsevier Interactive Patient Education  [employer].

## 2018-05-06 NOTE — Progress Notes (Signed)
Subjective:  [name], female    DOB: [birth year]  Age: 84 y.o. MRN: [id number]  CC: HTN f/u  HPI  Gina [name] a 84 y.o.femalewith a medical history of CHF, DM2, HTN, HLD, PVD, Graves disease, TB lung (treated), breast cancer, gastric ulcer, mesenteric ischemia, AKI, open angle glaucoma, tobacco abuse, left rib fracture, and arthritis presentsto f/u on HTN. She is accompanied by her "PCS" care giver which states patient is not taking medications as directed. Caregiver says she is "pulling out medications from 2017 and taking those", also has seen patient dose not take her nightly doses. Tries to correct patient but pt resists and says she knows what she is doing. Caregiver spends only 2 hours and 45 minutes with patient. Last BP 228/77 mmHg. BP 200/62 mmHg today. Pt endorses symptoms of urinary frequency. Pt does not complain of CP, palpitations, SOB, HA, abdominal pain, fever, chills, nausea, vomiting, back pain, dysuria, or GI sxs.     Outpatient Medications Prior to Visit  Medication Sig Dispense Refill  . amLODipine (NORVASC) 10 MG tablet Take 1 tablet (10 mg total) by mouth daily. 90 tablet 3  . dorzolamide-timolol (COSOPT) 22.3-6.8 MG/ML ophthalmic solution Place 1 drop into both eyes 2 (two) times daily.     . hydrochlorothiazide (HYDRODIURIL) 25 MG tablet Take 1 tablet (25 mg total) by mouth daily. 90 tablet 3  . latanoprost (XALATAN) 0.005 % ophthalmic solution Place 1 drop into both eyes at bedtime.    Marland Kitchen losartan (COZAAR) 50 MG tablet Take 1 tablet (50 mg total) by mouth daily. 90 tablet 3  . atorvastatin (LIPITOR) 20 MG tablet Take 1 tablet (20 mg total) by mouth daily at 6 PM. (Patient not taking: Reported on 03/10/2018) 90 tablet 0  . levothyroxine (SYNTHROID, LEVOTHROID) 88 MCG tablet Take 1 tablet (88 mcg total) by mouth daily. (Patient not taking: Reported on 05/06/2018) 60 tablet 0  . linaclotide (LINZESS) 145 MCG CAPS capsule Take 1 capsule (145 mcg  total) by mouth daily before breakfast. 28 capsule 6  . Multiple Vitamin (MULTIVITAMIN WITH MINERALS) TABS tablet Take 1 tablet by mouth daily. (Patient not taking: Reported on 02/18/2018)    . RHOPRESSA 0.02 % SOLN Place 1 drop into both eyes at bedtime.  11  . prednisoLONE acetate (PRED FORTE) 1 % ophthalmic suspension Instill 1 drop into right eye 6 times a day for 1 week, then 5 times a day for 1 week, then 4 times daily for 1 week, then 3 times daily for  1   No facility-administered medications prior to visit.      ROS Review of Systems  Constitutional: Negative for chills, fever and malaise/fatigue.  Eyes: Negative for blurred vision.  Respiratory: Negative for shortness of breath.   Cardiovascular: Negative for chest pain and palpitations.  Gastrointestinal: Negative for abdominal pain and nausea.  Genitourinary: Positive for frequency. Negative for dysuria and hematuria.  Musculoskeletal: Negative for joint pain and myalgias.  Skin: Negative for rash.  Neurological: Negative for tingling and headaches.  Psychiatric/Behavioral: Negative for depression. The patient is not nervous/anxious.     Objective:  BP (!) 200/62 (BP Location: Right Arm, Patient Position: Sitting, Cuff Size: Normal)   Pulse (!) 58   Temp 98.1 F (36.7 C) (Oral)   Ht 5\' 7"  (1.702 m)   Wt 118 lb 9.6 oz (53.8 kg)   SpO2 97%   BMI 18.58 kg/m   BP/Weight 05/06/2018 9/37/9024 0/05/7352  Systolic BP 299  233 612  Diastolic BP 62 77 61  Wt. (Lbs) 118.6 121.4 120.3  BMI 18.58 19.01 18.84      Physical Exam  Constitutional: She is oriented to person, place, and time.  Frail, elderly, NAD, polite  HENT:  Head: Normocephalic and atraumatic.  Eyes: No scleral icterus.  Neck: Normal range of motion. Neck supple. No thyromegaly present.  Cardiovascular: Normal rate, regular rhythm and normal heart sounds.  Pulmonary/Chest: Effort normal and breath sounds normal.  Abdominal: Soft. Bowel sounds are normal.  There is tenderness (suprapubic tenderness).  Musculoskeletal: She exhibits no edema.  Neurological: She is alert and oriented to person, place, and time.  Skin: Skin is warm and dry. No rash noted. No erythema. No pallor.  Psychiatric: She has a normal mood and affect. Her behavior is normal. Thought content normal.  Mild cognitive impairment apparent through conversation eg. Time to respond, tangential thinking, inability to understand certain questions, and needless repetition of thoughts expressed previously.  Vitals reviewed.    Assessment & Plan:   1. Hypertension, unspecified type - Advised pt and caregiver to go to ED to have BP lowered gradually. - Advised to take all antihypertensives as directed. She is to utilize the pharmacy to clarify her dosing schedule and medications. - I have messaged Ms. Eden Lathe RN at [facility] to help establish supervised care for patient. Current "PCS" caregiver is unable to manage patient and is present for only 2 hours and 45 minutes per day.   2. Cognitive decline - Not acute. Ambulatory referral to Neurology  3. Urinary frequency - Urinalysis Dipstick moderate leukocytes - Urine Culture - Begin ciprofloxacin (CIPRO) 500 MG tablet; Take 1 tablet (500 mg total) by mouth 2 (two) times daily for 5 days.  Dispense: 10 tablet; Refill: 0  4. Acute cystitis without hematuria - Urine Culture - Begin ciprofloxacin (CIPRO) 500 MG tablet; Take 1 tablet (500 mg total) by mouth 2 (two) times daily for 5 days.  Dispense: 10 tablet; Refill: 0   Meds ordered this encounter  Medications  . ciprofloxacin (CIPRO) 500 MG tablet    Sig: Take 1 tablet (500 mg total) by mouth 2 (two) times daily for 5 days.    Dispense:  10 tablet    Refill:  0    Order Specific Question:   Supervising Provider    Answer:   Charlott Rakes [[id number]]    Follow-up: Return in about 1 month (around 06/03/2018) for HTN.   Clent Demark PA

## 2018-05-07 NOTE — Telephone Encounter (Signed)
Patient is aware. Gina [name], CMA  

## 2018-05-08 ENCOUNTER — Telehealth (INDEPENDENT_AMBULATORY_CARE_PROVIDER_SITE_OTHER): Payer: Self-pay

## 2018-05-08 LAB — URINE CULTURE: Organism ID, Bacteria: NO GROWTH

## 2018-05-08 NOTE — Telephone Encounter (Signed)
-----   Message from Clent Demark, PA-C sent at 05/08/2018 10:29 AM EDT ----- No growth on urine culture. Finish taking Cipro regardless.

## 2018-05-08 NOTE — Telephone Encounter (Signed)
Patient aware that no growth on culture but complete cipro regardless. Nat Christen, CMA

## 2018-05-11 ENCOUNTER — Telehealth: Payer: Self-pay

## 2018-05-11 ENCOUNTER — Other Ambulatory Visit (INDEPENDENT_AMBULATORY_CARE_PROVIDER_SITE_OTHER): Payer: Self-pay | Admitting: Physician Assistant

## 2018-05-11 DIAGNOSIS — R4189 Other symptoms and signs involving cognitive functions and awareness: Secondary | ICD-10-CM

## 2018-05-11 NOTE — Progress Notes (Signed)
Home

## 2018-05-11 NOTE — Telephone Encounter (Signed)
Call placed to the patient and explained to her about referrals to [facility] and home health to assist with medication management and assess if she is eligible for any additional services  She was in agreement and had no preference for home health agencies.   [facility] referral faxed to attention of Kerilyn Cortner for home assessment, assistance with medication management and care coordination.

## 2018-05-15 ENCOUNTER — Telehealth: Payer: Self-pay

## 2018-05-15 NOTE — Telephone Encounter (Signed)
Called placed to [facility] care to inquire if they accept [facility] care. As per [name], they do not accept the patient's group.  Call placed to [facility] [place] who stated that they do accept [employer].  Ref[facility] to [facility]  - fax # [phone]

## 2018-05-18 ENCOUNTER — Telehealth: Payer: Self-pay

## 2018-05-18 NOTE — Telephone Encounter (Signed)
Call placed to [facility] # [phone], spoke to [place] who confirmed that they have the referral and will plan to do a start of care on 05/19/18 or 05/20/18.

## 2018-05-19 ENCOUNTER — Telehealth: Payer: Self-pay

## 2018-05-19 NOTE — Telephone Encounter (Signed)
Call received from Marcelino Scot, [facility], explaining that the patient has [place] Access 1 medicaid and they are not able to contact her /provide services.  They are only contracted to services those individuals with [place] Access 2 medicaid

## 2018-05-19 NOTE — Telephone Encounter (Signed)
Thank you :)

## 2018-05-19 NOTE — Telephone Encounter (Signed)
What do you advise? What are her choices at this point?

## 2018-05-20 ENCOUNTER — Telehealth: Payer: Self-pay

## 2018-05-20 NOTE — Telephone Encounter (Signed)
Call received from Marcelino Scot, [facility].  She stated that she ended up calling the patient and was also able to speak to the aide that was in the home.  Aimy explained that the confusion with medication may have occurred when the patient had to change pharmacies. She said the the patient had adherence packaging with her prior pharmacy and when she switched to [facility], the packaging was different and then medications were sent in bottles.  [place] said they patient may have gone about a month without taking medications correctly.  As per the patient and her caregiver, the problem now seems to be resolved and the patient has received the medication in adherence packages that she is comfortable with.

## 2018-05-20 NOTE — Telephone Encounter (Signed)
Ok, thanks for the diligent work. We will see if she finally takes medications as directed.

## 2018-05-31 ENCOUNTER — Emergency Department (HOSPITAL_COMMUNITY)
Admission: EM | Admit: 2018-05-31 | Discharge: 2018-05-31 | Disposition: A | Discharge status: Home / Self Care | Payer: Medicare Other | Attending: Emergency Medicine | Admitting: Emergency Medicine

## 2018-05-31 ENCOUNTER — Encounter (HOSPITAL_COMMUNITY): Payer: Self-pay | Admitting: Emergency Medicine

## 2018-05-31 DIAGNOSIS — R42 Dizziness and giddiness: Secondary | ICD-10-CM

## 2018-05-31 DIAGNOSIS — E038 Other specified hypothyroidism: Secondary | ICD-10-CM | POA: Diagnosis not present

## 2018-05-31 DIAGNOSIS — Z87891 Personal history of nicotine dependence: Secondary | ICD-10-CM | POA: Insufficient documentation

## 2018-05-31 DIAGNOSIS — I158 Other secondary hypertension: Secondary | ICD-10-CM | POA: Insufficient documentation

## 2018-05-31 DIAGNOSIS — E875 Hyperkalemia: Secondary | ICD-10-CM | POA: Diagnosis not present

## 2018-05-31 DIAGNOSIS — I110 Hypertensive heart disease with heart failure: Secondary | ICD-10-CM | POA: Insufficient documentation

## 2018-05-31 DIAGNOSIS — I5032 Chronic diastolic (congestive) heart failure: Secondary | ICD-10-CM | POA: Diagnosis not present

## 2018-05-31 DIAGNOSIS — E119 Type 2 diabetes mellitus without complications: Secondary | ICD-10-CM | POA: Insufficient documentation

## 2018-05-31 DIAGNOSIS — Z79899 Other long term (current) drug therapy: Secondary | ICD-10-CM | POA: Insufficient documentation

## 2018-05-31 LAB — I-STAT CHEM 8, ED
BUN: 32 mg/dL — ABNORMAL HIGH (ref 8–23)
Calcium, Ion: 1.2 mmol/L (ref 1.15–1.40)
Chloride: 115 mmol/L — ABNORMAL HIGH (ref 98–111)
Creatinine, Ser: 1.6 mg/dL — ABNORMAL HIGH (ref 0.44–1.00)
Glucose, Bld: 108 mg/dL — ABNORMAL HIGH (ref 70–99)
HCT: 32 % — ABNORMAL LOW (ref 36.0–46.0)
Hemoglobin: 10.9 g/dL — ABNORMAL LOW (ref 12.0–15.0)
Potassium: 5.3 mmol/L — ABNORMAL HIGH (ref 3.5–5.1)
Sodium: 140 mmol/L (ref 135–145)
TCO2: 18 mmol/L — ABNORMAL LOW (ref 22–32)

## 2018-05-31 LAB — BASIC METABOLIC PANEL
Anion gap: 4 — ABNORMAL LOW (ref 5–15)
BUN: 32 mg/dL — ABNORMAL HIGH (ref 8–23)
CO2: 21 mmol/L — ABNORMAL LOW (ref 22–32)
Calcium: 9.1 mg/dL (ref 8.9–10.3)
Chloride: 115 mmol/L — ABNORMAL HIGH (ref 98–111)
Creatinine, Ser: 1.47 mg/dL — ABNORMAL HIGH (ref 0.44–1.00)
GFR calc Af Amer: 36 mL/min — ABNORMAL LOW (ref >60)
GFR calc non Af Amer: 31 mL/min — ABNORMAL LOW (ref >60)
Glucose, Bld: 109 mg/dL — ABNORMAL HIGH (ref 70–99)
Potassium: 5.3 mmol/L — ABNORMAL HIGH (ref 3.5–5.1)
Sodium: 140 mmol/L (ref 135–145)

## 2018-05-31 MED ORDER — HYDROCHLOROTHIAZIDE 25 MG PO TABS
25.0000 mg | ORAL_TABLET | Freq: Once | ORAL | Status: AC
  Administered 2018-05-31: 25 mg via ORAL
  Filled 2018-05-31: qty 1

## 2018-05-31 MED ORDER — LOSARTAN POTASSIUM 50 MG PO TABS
50.0000 mg | ORAL_TABLET | Freq: Once | ORAL | Status: AC
  Administered 2018-05-31: 50 mg via ORAL
  Filled 2018-05-31: qty 1

## 2018-05-31 MED ORDER — AMLODIPINE BESYLATE 5 MG PO TABS
10.0000 mg | ORAL_TABLET | Freq: Once | ORAL | Status: AC
  Administered 2018-05-31: 10 mg via ORAL
  Filled 2018-05-31: qty 2

## 2018-05-31 NOTE — ED Notes (Signed)
Doctor at bedside.

## 2018-05-31 NOTE — ED Notes (Signed)
Patient resting comfortable on stretcher. States feels better and does not have any dizziness at this time. Alert answering and following commands appropriate.
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
## 2018-05-31 NOTE — ED Triage Notes (Addendum)
Brought by ems from home for c/o dizziness upon getting out of the shower.  Per ems patient crawled to the door because she was afraid she would fall.  Reports the dizziness has passed and denies having any pain.  Bp elevated.  Patient endorses a hx of anemia with multiple transfusions.

## 2018-06-11 ENCOUNTER — Other Ambulatory Visit: Payer: Self-pay

## 2018-06-11 ENCOUNTER — Ambulatory Visit (INDEPENDENT_AMBULATORY_CARE_PROVIDER_SITE_OTHER): Payer: Medicare Other | Admitting: Physician Assistant

## 2018-06-11 ENCOUNTER — Encounter (INDEPENDENT_AMBULATORY_CARE_PROVIDER_SITE_OTHER): Payer: Self-pay | Admitting: Physician Assistant

## 2018-06-11 VITALS — BP 196/66 | HR 65 | Temp 97.7°F | Ht 67.0 in | Wt 112.6 lb

## 2018-06-11 DIAGNOSIS — E119 Type 2 diabetes mellitus without complications: Secondary | ICD-10-CM

## 2018-06-11 DIAGNOSIS — I10 Essential (primary) hypertension: Secondary | ICD-10-CM

## 2018-06-11 DIAGNOSIS — E875 Hyperkalemia: Secondary | ICD-10-CM | POA: Diagnosis not present

## 2018-06-11 DIAGNOSIS — H9190 Unspecified hearing loss, unspecified ear: Secondary | ICD-10-CM

## 2018-06-11 LAB — POCT GLYCOSYLATED HEMOGLOBIN (HGB A1C): Hemoglobin A1C: 5.7 % — AB (ref 4.0–5.6)

## 2018-06-11 MED ORDER — LOSARTAN POTASSIUM 50 MG PO TABS: 100.0000 mg | ORAL_TABLET | Freq: Every day | ORAL | 3 refills | Status: DC

## 2018-06-11 NOTE — Patient Instructions (Signed)
Hearing Loss Hearing loss is a partial or total loss of the ability to hear. This can be temporary or permanent, and it can happen in one or both ears. Hearing loss may be referred to as deafness. Medical care is necessary to treat hearing loss properly and to prevent the condition from getting worse. Your hearing may partially or completely come back, depending on what caused your hearing loss and how severe it is. In some cases, hearing loss is permanent. What are the causes? Common causes of hearing loss include:  Too much wax in the ear canal.  Infection of the ear canal or middle ear.  Fluid in the middle ear.  Injury to the ear or surrounding area.  An object stuck in the ear.  Prolonged exposure to loud sounds, such as music.  Less common causes of hearing loss include:  Tumors in the ear.  Viral or bacterial infections, such as meningitis.  A hole in the eardrum (perforated eardrum).  Problems with the hearing nerve that sends signals between the brain and the ear.  Certain medicines.  What are the signs or symptoms? Symptoms of this condition may include:  Difficulty telling the difference between sounds.  Difficulty following a conversation when there is background noise.  Lack of response to sounds in your environment. This may be most noticeable when you do not respond to startling sounds.  Needing to turn up the volume on the television, radio, etc.  Ringing in the ears.  Dizziness.  Pain in the ears.  How is this diagnosed? This condition is diagnosed based on a physical exam and a hearing test (audiometry). The audiometry test will be performed by a hearing specialist (audiologist). You may also be referred to an ear, nose, and throat (ENT) specialist (otolaryngologist). How is this treated? Treatment for recent onset of hearing loss may include:  Ear wax removal.  Being prescribed medicines to prevent infection (antibiotics).  Being prescribed  medicines to reduce inflammation (corticosteroids).  Follow these instructions at home:  If you were prescribed an antibiotic medicine, take it as told by your health care provider. Do not stop taking the antibiotic even if you start to feel better.  Take over-the-counter and prescription medicines only as told by your health care provider.  Avoid loud noises.  Return to your normal activities as told by your health care provider. Ask your health care provider what activities are safe for you.  Keep all follow-up visits as told by your health care provider. This is important. Contact a health care provider if:  You feel dizzy.  You develop new symptoms.  You vomit or feel nauseous.  You have a fever. Get help right away if:  You develop sudden changes in your vision.  You have severe ear pain.  You have new or increased weakness.  You have a severe headache. This information is not intended to replace advice given to you by your health care provider. Make sure you discuss any questions you have with your health care provider. Document Released: 09/16/2005 Document Revised: 02/22/2016 Document Reviewed: 02/01/2015 Elsevier Interactive Patient Education  2018 [employer].

## 2018-06-11 NOTE — Progress Notes (Signed)
Subjective:  [name], female    DOB: May 11, 1929  Age: 84 y.o. MRN: [id number]  CC: f/u HTN  HPI RAKEYA GLAB a 84 y.o.femalewith a medical history of CHF, DM2, HTN, HLD, PVD, Graves disease, TB lung (treated), breast cancer, gastric ulcer, mesenteric ischemia, AKI, open angle glaucoma, tobacco abuse, left rib fracture, and arthritis presentsto f/u on ED visit from 05/31/18 for vertigo. Workup revealed  stable Hb and elevated K+ at 5.3 mmol/L. Renal function at baseline. Diagnosed with vertigo likely 2/2 BPPV. Recommended continued HCTZ. No longer dizzy and keeping hydrated.     BP noted to be elevated again at 196/66 mmHg today. Pt had each medication explained to her at last visit with specific written instructions on how to take medications. Pt states she is taking medications as directed. Reports BP is 154/74 mmHg this morning. Says her blood pressure "shoots up" when she goes to the doctor's office. Does not endorse CP, palpitations, SOB, HA, tingling, numbness, abdominal pain, f/c/n/v, rash, swelling, or GI/GU sxs.     Outpatient Medications Prior to Visit  Medication Sig Dispense Refill  . amLODipine (NORVASC) 10 MG tablet Take 1 tablet (10 mg total) by mouth daily. 90 tablet 3  . atorvastatin (LIPITOR) 20 MG tablet Take 1 tablet (20 mg total) by mouth daily at 6 PM. 90 tablet 1  . dorzolamide-timolol (COSOPT) 22.3-6.8 MG/ML ophthalmic solution Place 1 drop into both eyes 2 (two) times daily.     . hydrochlorothiazide (HYDRODIURIL) 25 MG tablet Take 1 tablet (25 mg total) by mouth daily. 90 tablet 3  . latanoprost (XALATAN) 0.005 % ophthalmic solution Place 1 drop into both eyes at bedtime.    Marland Kitchen levothyroxine (SYNTHROID, LEVOTHROID) 88 MCG tablet Take 1 tablet (88 mcg total) by mouth daily. 60 tablet 0  . linaclotide (LINZESS) 145 MCG CAPS capsule Take 1 capsule (145 mcg total) by mouth daily before breakfast. 28 capsule 6  . losartan (COZAAR) 50 MG tablet  Take 1 tablet (50 mg total) by mouth daily. 90 tablet 3  . RHOPRESSA 0.02 % SOLN Place 1 drop into both eyes at bedtime.  11  . Multiple Vitamin (MULTIVITAMIN WITH MINERALS) TABS tablet Take 1 tablet by mouth daily. (Patient not taking: Reported on 02/18/2018)     No facility-administered medications prior to visit.      ROS Review of Systems  Constitutional: Negative for chills, fever and malaise/fatigue.  Eyes: Negative for blurred vision.  Respiratory: Negative for shortness of breath.   Cardiovascular: Negative for chest pain and palpitations.  Gastrointestinal: Negative for abdominal pain and nausea.  Genitourinary: Negative for dysuria and hematuria.  Musculoskeletal: Negative for joint pain and myalgias.  Skin: Negative for rash.  Neurological: Negative for tingling and headaches.  Psychiatric/Behavioral: Negative for depression. The patient is not nervous/anxious.     Objective:  BP (!) 196/66 (BP Location: Right Arm, Patient Position: Sitting, Cuff Size: Small)   Pulse 65   Temp 97.7 F (36.5 C) (Oral)   Ht 5\' 7"  (1.702 m)   Wt 112 lb 9.6 oz (51.1 kg)   SpO2 99%   BMI 17.64 kg/m   BP/Weight 06/11/2018 03/08/4853 03/01/7034  Systolic BP 009 381 829  Diastolic BP 66 58 62  Wt. (Lbs) 112.6 118.61 118.6  BMI 17.64 18.58 18.58      Physical Exam  Constitutional: She is oriented to person, place, and time.  Elderly, frail, NAD, polite  HENT:  Head: Normocephalic and atraumatic.  Eyes: No scleral icterus.  Neck: Normal range of motion. Neck supple. No thyromegaly present.  Cardiovascular: Normal rate, regular rhythm and normal heart sounds.  No LE edema bilaterally  Pulmonary/Chest: Effort normal and breath sounds normal.  Musculoskeletal: She exhibits no edema.  Neurological: She is alert and oriented to person, place, and time.  Loss of audition to conversational tones  Skin: Skin is warm and dry. No rash noted. No erythema. No pallor.  Psychiatric: She has a  normal mood and affect. Her behavior is normal. Thought content normal.  Vitals reviewed.    Assessment & Plan:   1. Type 2 diabetes mellitus without complication, without long-term current use of insulin (HCC) - HgB A1c 5.7% today.  2. Hearing loss, unspecified hearing loss type, unspecified laterality - Ambulatory referral to Audiology  3. Hypertension, unspecified type - Increase losartan (COZAAR) 50 MG tablet; Take 2 tablets (100 mg total) by mouth daily.  Dispense: 180 tablet; Refill: 3. Instructed patient to hydrate properly and avoid becoming dehydrated. Pt acknowledged understanding.  4. Hyperkalemia - Basic Metabolic Panel   Meds ordered this encounter  Medications  . losartan (COZAAR) 50 MG tablet    Sig: Take 2 tablets (100 mg total) by mouth daily.    Dispense:  180 tablet    Refill:  3    Order Specific Question:   Supervising Provider    Answer:   Charlott Rakes [[id number]]    Follow-up: Return in about 8 weeks (around 08/06/2018) for HTN.   Clent Demark PA

## 2018-06-11 NOTE — Progress Notes (Signed)
Patient recently seen in ED for vertigo, elevated potassium and HTN

## 2018-06-12 ENCOUNTER — Other Ambulatory Visit (INDEPENDENT_AMBULATORY_CARE_PROVIDER_SITE_OTHER): Payer: Self-pay | Admitting: Physician Assistant

## 2018-06-12 DIAGNOSIS — E875 Hyperkalemia: Secondary | ICD-10-CM

## 2018-06-12 DIAGNOSIS — I10 Essential (primary) hypertension: Secondary | ICD-10-CM

## 2018-06-16 ENCOUNTER — Other Ambulatory Visit (INDEPENDENT_AMBULATORY_CARE_PROVIDER_SITE_OTHER): Payer: Medicare Other

## 2018-06-16 DIAGNOSIS — E875 Hyperkalemia: Secondary | ICD-10-CM

## 2018-06-16 DIAGNOSIS — I10 Essential (primary) hypertension: Secondary | ICD-10-CM

## 2018-06-16 NOTE — Progress Notes (Signed)
Labs collected by onsite [place] phlebotomist. Nat Christen, [place]

## 2018-06-17 ENCOUNTER — Telehealth (INDEPENDENT_AMBULATORY_CARE_PROVIDER_SITE_OTHER): Payer: Self-pay

## 2018-06-17 LAB — BASIC METABOLIC PANEL
BUN/Creatinine Ratio: 28 (ref 12–28)
BUN: 42 mg/dL — ABNORMAL HIGH (ref 8–27)
CO2: 18 mmol/L — ABNORMAL LOW (ref 20–29)
Calcium: 9 mg/dL (ref 8.7–10.3)
Chloride: 110 mmol/L — ABNORMAL HIGH (ref 96–106)
Creatinine, Ser: 1.5 mg/dL — ABNORMAL HIGH (ref 0.57–1.00)
GFR calc Af Amer: 36 mL/min/{1.73_m2} — ABNORMAL LOW (ref >59)
GFR calc non Af Amer: 31 mL/min/{1.73_m2} — ABNORMAL LOW (ref >59)
Glucose: 103 mg/dL — ABNORMAL HIGH (ref 65–99)
Potassium: 5 mmol/L (ref 3.5–5.2)
Sodium: 143 mmol/L (ref 134–144)

## 2018-06-17 NOTE — Telephone Encounter (Signed)
-----   Message from Clent Demark, PA-C sent at 06/17/2018  9:39 AM EDT ----- CKD stable. Pt should also hydrate properly.

## 2018-06-17 NOTE — Telephone Encounter (Signed)
Patient is aware that CKD is stable and the need for her to hydrate properly( drink more water). Nat Christen, CMA

## 2018-07-06 ENCOUNTER — Ambulatory Visit: Payer: Medicare Other | Admitting: Neurology

## 2018-08-06 ENCOUNTER — Ambulatory Visit: Payer: Medicare Other | Admitting: Family

## 2018-08-06 ENCOUNTER — Other Ambulatory Visit (HOSPITAL_COMMUNITY): Payer: Medicare Other

## 2018-08-06 ENCOUNTER — Encounter (HOSPITAL_COMMUNITY): Payer: Medicare Other

## 2018-08-07 ENCOUNTER — Ambulatory Visit (INDEPENDENT_AMBULATORY_CARE_PROVIDER_SITE_OTHER): Payer: Medicare Other | Admitting: Physician Assistant

## 2018-08-07 ENCOUNTER — Other Ambulatory Visit: Payer: Self-pay

## 2018-08-07 ENCOUNTER — Encounter (INDEPENDENT_AMBULATORY_CARE_PROVIDER_SITE_OTHER): Payer: Self-pay | Admitting: Physician Assistant

## 2018-08-07 VITALS — BP 197/69 | HR 64 | Temp 98.6°F | Ht 67.0 in | Wt 119.2 lb

## 2018-08-07 DIAGNOSIS — I10 Essential (primary) hypertension: Secondary | ICD-10-CM

## 2018-08-07 NOTE — Progress Notes (Signed)
Subjective:  [name], female    DOB: [birth year]  Age: 84 y.o. MRN: [id number]  CC: f/u HTN  HPI  Gina [name] a 84 y.o.femalewith a medical history of CHF, DM2, HTN, HLD, PVD, Graves disease, TB lung (treated), breast cancer, gastric ulcer, mesenteric ischemia, AKI, open angle glaucoma, tobacco abuse, left rib fracture, and arthritis presents to f/u on HTN. Last BP 196/66 mmHg nearly two months ago. Says she has not taken her anti-hypertensives this morning. Has caregiver whom accompanies patient to appointments but caregiver failed to make sure patient has taken her medications. Reports taking blood pressure at home and reports lower readings. Says she has seen BP reading of 144/154 mmHg at home. Says she is 100% sure her BP was 144/154 mmHg. Denies CP, palpitations, SOB, PND, orthopnea, LE edema, HA, paresthesias, presyncope, syncope, abdominal pain, f/c/n/v, or GI/GU sxs.      Outpatient Medications Prior to Visit  Medication Sig Dispense Refill  . amLODipine (NORVASC) 10 MG tablet Take 1 tablet (10 mg total) by mouth daily. 90 tablet 3  . atorvastatin (LIPITOR) 20 MG tablet Take 1 tablet (20 mg total) by mouth daily at 6 PM. 90 tablet 1  . dorzolamide-timolol (COSOPT) 22.3-6.8 MG/ML ophthalmic solution Place 1 drop into both eyes 2 (two) times daily.     . hydrochlorothiazide (HYDRODIURIL) 25 MG tablet Take 1 tablet (25 mg total) by mouth daily. 90 tablet 3  . latanoprost (XALATAN) 0.005 % ophthalmic solution Place 1 drop into both eyes at bedtime.    Marland Kitchen levothyroxine (SYNTHROID, LEVOTHROID) 88 MCG tablet Take 1 tablet (88 mcg total) by mouth daily. 60 tablet 0  . linaclotide (LINZESS) 145 MCG CAPS capsule Take 1 capsule (145 mcg total) by mouth daily before breakfast. 28 capsule 6  . losartan (COZAAR) 50 MG tablet Take 2 tablets (100 mg total) by mouth daily. 180 tablet 3  . Multiple Vitamin (MULTIVITAMIN WITH MINERALS) TABS tablet Take 1 tablet by mouth  daily.    . RHOPRESSA 0.02 % SOLN Place 1 drop into both eyes at bedtime.  11   No facility-administered medications prior to visit.      ROS Review of Systems  Constitutional: Negative for chills, fever and malaise/fatigue.  Eyes: Negative for blurred vision.  Respiratory: Negative for shortness of breath.   Cardiovascular: Negative for chest pain and palpitations.  Gastrointestinal: Negative for abdominal pain and nausea.  Genitourinary: Negative for dysuria and hematuria.  Musculoskeletal: Negative for joint pain and myalgias.  Skin: Negative for rash.  Neurological: Negative for tingling and headaches.  Psychiatric/Behavioral: Negative for depression. The patient is not nervous/anxious.     Objective:  BP (!) 207/81 (BP Location: Right Arm, Patient Position: Sitting, Cuff Size: Small)   Pulse 64   Temp 98.6 F (37 C) (Oral)   Ht 5\' 7"  (1.702 m)   Wt 119 lb 3.2 oz (54.1 kg)   SpO2 97%   BMI 18.67 kg/m   BP/Weight 08/07/2018 9/41/7408 10/03/4816  Systolic BP 563 149 702  Diastolic BP 81 66 58  Wt. (Lbs) 119.2 112.6 118.61  BMI 18.67 17.64 18.58      Physical Exam  Constitutional: She is oriented to person, place, and time.  Elderly, frail, NAD, polite  HENT:  Head: Normocephalic and atraumatic.  Eyes: No scleral icterus.  Neck: Normal range of motion. Neck supple. No thyromegaly present.  Cardiovascular: Normal rate, regular rhythm and normal heart sounds.  Pulmonary/Chest: Effort normal and breath  sounds normal.  Abdominal: Soft. Bowel sounds are normal. There is no tenderness.  Musculoskeletal: She exhibits no edema.  Neurological: She is alert and oriented to person, place, and time.  Skin: Skin is warm and dry. No rash noted. No erythema. No pallor.  Psychiatric: She has a normal mood and affect. Her behavior is normal. Thought content normal.  Vitals reviewed.    Assessment & Plan:    1. Hypertension, unspecified type - Ambulatory referral to  Cardiology for placement of 24 hr ambulatory blood pressure monitor.     Follow-up: Return in about 8 weeks (around 10/02/2018) for HTN.   Clent Demark PA

## 2018-08-07 NOTE — Patient Instructions (Signed)

## 2018-08-31 ENCOUNTER — Ambulatory Visit (INDEPENDENT_AMBULATORY_CARE_PROVIDER_SITE_OTHER): Payer: Medicare Other | Admitting: Neurology

## 2018-08-31 ENCOUNTER — Encounter: Payer: Self-pay | Admitting: Neurology

## 2018-08-31 VITALS — BP 182/64 | HR 62 | Ht 67.0 in | Wt 121.0 lb

## 2018-08-31 DIAGNOSIS — R4189 Other symptoms and signs involving cognitive functions and awareness: Secondary | ICD-10-CM

## 2018-08-31 DIAGNOSIS — E538 Deficiency of other specified B group vitamins: Secondary | ICD-10-CM | POA: Diagnosis not present
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
## 2018-08-31 NOTE — Patient Instructions (Signed)

## 2018-09-02 ENCOUNTER — Telehealth: Payer: Self-pay | Admitting: Neurology

## 2018-09-02 LAB — B12 AND FOLATE PANEL
Folate: 6.3 ng/mL (ref >3.0)
Vitamin B-12: 296 pg/mL (ref 232–1245)

## 2018-09-02 LAB — HIV ANTIBODY (ROUTINE TESTING W REFLEX): HIV Screen 4th Generation wRfx: NONREACTIVE

## 2018-09-02 LAB — METHYLMALONIC ACID, SERUM: Methylmalonic Acid: 553 nmol/L — ABNORMAL HIGH (ref 0–378)

## 2018-09-02 LAB — VITAMIN B1: Thiamine: 72.2 nmol/L (ref 66.5–200.0)

## 2018-09-02 LAB — HOMOCYSTEINE: Homocysteine: 36.3 umol/L — ABNORMAL HIGH (ref 0.0–15.0)

## 2018-09-02 LAB — RPR: RPR Ser Ql: NONREACTIVE

## 2018-09-02 NOTE — Telephone Encounter (Signed)
Patient is B12 deficient. B12 deficiency can cause dementia. She needs to start taking 1034mcg daily, long term, and recheck with her pcp in 3-4 months. thanks

## 2018-09-07 NOTE — Telephone Encounter (Signed)
Pts niece [name] returning RNs call

## 2018-09-07 NOTE — Telephone Encounter (Signed)
Spoke with pt's son [name] (on [place]). He is a Administrator and is not as involved in her care as the niece [name] so he asked if RN would call her to discuss. Called Gwendolyn Lima (on [place]), niece, and spoke with a gentleman who answered. He will have her call our office back. Left office number. Will try to reach again if we do not hear from her today.

## 2018-09-07 NOTE — Telephone Encounter (Signed)
Spoke with Gwendolyn Lima (on [place]) and discussed that pt is B12 deficient and Vitamin B12 deficiency can cause Dementia. Advised per Dr. Jaynee Eagles that pt needs to start taking Vitamin B12 1000 mcg daily long term and recheck the lab levels with her primary care Altamease Oiler) in 3-4 months. Advised that this can be purchased over the counter at a pharmacy such as [facility], [facility], sports or vitamin store. She verbalized understanding and appreciation. She will pass this information to pt & caregiver to make sure pt starts taking it.

## 2018-09-19 ENCOUNTER — Other Ambulatory Visit (INDEPENDENT_AMBULATORY_CARE_PROVIDER_SITE_OTHER): Payer: Self-pay | Admitting: Physician Assistant

## 2018-09-19 DIAGNOSIS — I10 Essential (primary) hypertension: Secondary | ICD-10-CM

## 2018-09-19 DIAGNOSIS — I739 Peripheral vascular disease, unspecified: Secondary | ICD-10-CM

## 2018-09-21 NOTE — Telephone Encounter (Signed)
FWD to PCP. Tempestt S Roberts, CMA  

## 2018-10-02 ENCOUNTER — Other Ambulatory Visit (INDEPENDENT_AMBULATORY_CARE_PROVIDER_SITE_OTHER): Payer: Self-pay | Admitting: Physician Assistant

## 2018-10-02 DIAGNOSIS — I10 Essential (primary) hypertension: Secondary | ICD-10-CM

## 2018-10-02 NOTE — Telephone Encounter (Signed)
FWD to PCP. Kaye Luoma S Tayllor Breitenstein, CMA  

## 2018-10-09 ENCOUNTER — Ambulatory Visit (INDEPENDENT_AMBULATORY_CARE_PROVIDER_SITE_OTHER): Payer: Medicare Other | Admitting: Physician Assistant

## 2018-10-12 ENCOUNTER — Ambulatory Visit (INDEPENDENT_AMBULATORY_CARE_PROVIDER_SITE_OTHER): Payer: Medicare Other | Admitting: Internal Medicine

## 2018-10-12 ENCOUNTER — Ambulatory Visit (INDEPENDENT_AMBULATORY_CARE_PROVIDER_SITE_OTHER): Payer: Medicare Other | Admitting: Family

## 2018-10-12 ENCOUNTER — Encounter (INDEPENDENT_AMBULATORY_CARE_PROVIDER_SITE_OTHER): Payer: Self-pay | Admitting: Internal Medicine

## 2018-10-12 ENCOUNTER — Other Ambulatory Visit: Payer: Self-pay

## 2018-10-12 ENCOUNTER — Ambulatory Visit (INDEPENDENT_AMBULATORY_CARE_PROVIDER_SITE_OTHER)
Admission: RE | Admit: 2018-10-12 | Discharge: 2018-10-12 | Disposition: A | Discharge status: Home / Self Care | Payer: Medicare Other | Source: Ambulatory Visit | Attending: Family | Admitting: Family

## 2018-10-12 ENCOUNTER — Encounter: Payer: Self-pay | Admitting: Family

## 2018-10-12 ENCOUNTER — Ambulatory Visit (HOSPITAL_COMMUNITY)
Admission: RE | Admit: 2018-10-12 | Discharge: 2018-10-12 | Disposition: A | Discharge status: Home / Self Care | Payer: Medicare Other | Source: Ambulatory Visit | Attending: Family | Admitting: Family

## 2018-10-12 VITALS — BP 188/57 | HR 68 | Temp 97.4°F | Resp 18 | Ht 67.0 in | Wt 126.0 lb

## 2018-10-12 VITALS — BP 178/67 | HR 58 | Temp 97.5°F | Ht 67.0 in | Wt 126.2 lb

## 2018-10-12 DIAGNOSIS — D649 Anemia, unspecified: Secondary | ICD-10-CM

## 2018-10-12 DIAGNOSIS — I6523 Occlusion and stenosis of bilateral carotid arteries: Secondary | ICD-10-CM | POA: Diagnosis not present

## 2018-10-12 DIAGNOSIS — I779 Disorder of arteries and arterioles, unspecified: Secondary | ICD-10-CM | POA: Diagnosis present

## 2018-10-12 DIAGNOSIS — R600 Localized edema: Secondary | ICD-10-CM | POA: Insufficient documentation

## 2018-10-12 DIAGNOSIS — I130 Hypertensive heart and chronic kidney disease with heart failure and stage 1 through stage 4 chronic kidney disease, or unspecified chronic kidney disease: Secondary | ICD-10-CM | POA: Diagnosis not present

## 2018-10-12 DIAGNOSIS — I110 Hypertensive heart disease with heart failure: Secondary | ICD-10-CM

## 2018-10-12 DIAGNOSIS — Z87891 Personal history of nicotine dependence: Secondary | ICD-10-CM

## 2018-10-12 DIAGNOSIS — Z95828 Presence of other vascular implants and grafts: Secondary | ICD-10-CM

## 2018-10-12 DIAGNOSIS — E1122 Type 2 diabetes mellitus with diabetic chronic kidney disease: Secondary | ICD-10-CM

## 2018-10-12 DIAGNOSIS — E890 Postprocedural hypothyroidism: Secondary | ICD-10-CM

## 2018-10-12 DIAGNOSIS — N183 Chronic kidney disease, stage 3 unspecified: Secondary | ICD-10-CM

## 2018-10-12 DIAGNOSIS — I5032 Chronic diastolic (congestive) heart failure: Secondary | ICD-10-CM | POA: Diagnosis not present

## 2018-10-12 DIAGNOSIS — E118 Type 2 diabetes mellitus with unspecified complications: Secondary | ICD-10-CM

## 2018-10-12 DIAGNOSIS — E538 Deficiency of other specified B group vitamins: Secondary | ICD-10-CM

## 2018-10-12 MED ORDER — FUROSEMIDE 40 MG PO TABS: 40.0000 mg | ORAL_TABLET | Freq: Every day | ORAL | 3 refills | Status: DC
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
## 2018-10-12 NOTE — Patient Instructions (Signed)
Stop hydrochlorothiazide.  Start furosemide 40 mg daily instead.  This will help decrease the swelling in the lower extremities.  Try to limit salt in the foods.

## 2018-10-12 NOTE — Patient Instructions (Signed)
Peripheral Vascular Disease  Peripheral vascular disease (PVD) is a disease of the blood vessels that are not part of your heart and brain. A simple term for PVD is poor circulation. In most cases, PVD narrows the blood vessels that carry blood from your heart to the rest of your body. This can reduce the supply of blood to your arms, legs, and internal organs, like your stomach or kidneys. However, PVD most often affects a person's lower legs and feet. Without treatment, PVD tends to get worse. PVD can also lead to acute ischemic limb. This is when an arm or leg suddenly cannot get enough blood. This is a medical emergency. Follow these instructions at home: Lifestyle  Do not use any products that contain nicotine or tobacco, such as cigarettes and e-cigarettes. If you need help quitting, ask your doctor.  Lose weight if you are overweight. Or, stay at a healthy weight as told by your doctor.  Eat a diet that is low in fat and cholesterol. If you need help, ask your doctor.  Exercise regularly. Ask your doctor for activities that are right for you. General instructions  Take over-the-counter and prescription medicines only as told by your doctor.  Take good care of your feet: ? Wear comfortable shoes that fit well. ? Check your feet often for any cuts or sores.  Keep all follow-up visits as told by your doctor This is important. Contact a doctor if:  You have cramps in your legs when you walk.  You have leg pain when you are at rest.  You have coldness in a leg or foot.  Your skin changes.  You are unable to get or have an erection (erectile dysfunction).  You have cuts or sores on your feet that do not heal. Get help right away if:  Your arm or leg turns cold, numb, and blue.  Your arms or legs become red, warm, swollen, painful, or numb.  You have chest pain.  You have trouble breathing.  You suddenly have weakness in your face, arm, or leg.  You become very  confused or you cannot speak.  You suddenly have a very bad headache.  You suddenly cannot see. Summary  Peripheral vascular disease (PVD) is a disease of the blood vessels.  A simple term for PVD is poor circulation. Without treatment, PVD tends to get worse.  Treatment may include exercise, low fat and low cholesterol diet, and quitting smoking. This information is not intended to replace advice given to you by your health care provider. Make sure you discuss any questions you have with your health care provider. Document Released: 12/11/2009 Document Revised: 10/24/2016 Document Reviewed: 10/24/2016 Elsevier Interactive Patient Education  2019 [employer] Inc.  

## 2018-10-13 ENCOUNTER — Ambulatory Visit (HOSPITAL_BASED_OUTPATIENT_CLINIC_OR_DEPARTMENT_OTHER)
Admission: RE | Admit: 2018-10-13 | Discharge: 2018-10-13 | Disposition: A | Discharge status: Home / Self Care | Payer: Medicare Other | Source: Ambulatory Visit | Attending: Internal Medicine | Admitting: Internal Medicine

## 2018-10-13 DIAGNOSIS — R600 Localized edema: Secondary | ICD-10-CM | POA: Diagnosis not present

## 2018-10-13 LAB — HEMOGLOBIN A1C
Est. average glucose Bld gHb Est-mCnc: 131 mg/dL
Hgb A1c MFr Bld: 6.2 % — ABNORMAL HIGH (ref 4.8–5.6)

## 2018-10-13 LAB — CBC
Hematocrit: 27.4 % — ABNORMAL LOW (ref 34.0–46.6)
Hemoglobin: 8.5 g/dL — ABNORMAL LOW (ref 11.1–15.9)
MCH: 27.8 pg (ref 26.6–33.0)
MCHC: 31 g/dL — ABNORMAL LOW (ref 31.5–35.7)
MCV: 90 fL (ref 79–97)
Platelets: 229 10*3/uL (ref 150–450)
RBC: 3.06 x10E6/uL — ABNORMAL LOW (ref 3.77–5.28)
RDW: 13.2 % (ref 11.7–15.4)
WBC: 4.5 10*3/uL (ref 3.4–10.8)

## 2018-10-13 LAB — COMPREHENSIVE METABOLIC PANEL
ALT: 10 IU/L (ref 0–32)
AST: 15 IU/L (ref 0–40)
Albumin/Globulin Ratio: 1.2 (ref 1.2–2.2)
Albumin: 3.8 g/dL (ref 3.5–4.7)
Alkaline Phosphatase: 73 IU/L (ref 39–117)
BUN/Creatinine Ratio: 26 (ref 12–28)
BUN: 41 mg/dL — ABNORMAL HIGH (ref 8–27)
Bilirubin Total: 0.3 mg/dL (ref 0.0–1.2)
CO2: 18 mmol/L — ABNORMAL LOW (ref 20–29)
Calcium: 8.6 mg/dL — ABNORMAL LOW (ref 8.7–10.3)
Chloride: 110 mmol/L — ABNORMAL HIGH (ref 96–106)
Creatinine, Ser: 1.58 mg/dL — ABNORMAL HIGH (ref 0.57–1.00)
GFR calc Af Amer: 34 mL/min/{1.73_m2} — ABNORMAL LOW (ref >59)
GFR calc non Af Amer: 29 mL/min/{1.73_m2} — ABNORMAL LOW (ref >59)
Globulin, Total: 3.1 g/dL (ref 1.5–4.5)
Glucose: 102 mg/dL — ABNORMAL HIGH (ref 65–99)
Potassium: 4.4 mmol/L (ref 3.5–5.2)
Sodium: 142 mmol/L (ref 134–144)
Total Protein: 6.9 g/dL (ref 6.0–8.5)

## 2018-10-13 LAB — TSH: TSH: 3.72 u[IU]/mL (ref 0.450–4.500)

## 2018-10-13 NOTE — Progress Notes (Signed)
RLE venous duplex       has been completed. Preliminary results can be found under CV proc through chart review. June Leap, BS, RDMS, RVT  Called results to Dr. Durenda Age office

## 2018-10-14 MED ORDER — FERROUS SULFATE 325 (65 FE) MG PO TBEC: 325.0000 mg | DELAYED_RELEASE_TABLET | Freq: Every day | ORAL | 3 refills | Status: DC

## 2018-10-14 MED ORDER — VITAMIN D3 10 MCG (400 UNIT) PO TABS: 400.0000 [IU] | ORAL_TABLET | Freq: Every day | ORAL | 1 refills | Status: DC

## 2018-10-14 NOTE — Addendum Note (Signed)
Addended by: Karle Plumber B on: 10/14/2018 11:14 AM   Modules accepted: Orders

## 2018-10-16 LAB — IRON,TIBC AND FERRITIN PANEL
Ferritin: 26 ng/mL (ref 15–150)
Iron Saturation: 6 % — CL (ref 15–55)
Iron: 22 ug/dL — ABNORMAL LOW (ref 27–139)
Total Iron Binding Capacity: 343 ug/dL (ref 250–450)
UIBC: 321 ug/dL (ref 118–369)

## 2018-10-20 ENCOUNTER — Encounter (INDEPENDENT_AMBULATORY_CARE_PROVIDER_SITE_OTHER): Payer: Self-pay | Admitting: Family Medicine

## 2018-10-20 ENCOUNTER — Other Ambulatory Visit: Payer: Self-pay

## 2018-10-20 ENCOUNTER — Ambulatory Visit (INDEPENDENT_AMBULATORY_CARE_PROVIDER_SITE_OTHER): Payer: Medicare Other | Admitting: Family Medicine

## 2018-10-20 VITALS — BP 191/54 | HR 52 | Temp 97.3°F | Ht 67.0 in | Wt 123.6 lb

## 2018-10-20 DIAGNOSIS — I10 Essential (primary) hypertension: Secondary | ICD-10-CM

## 2018-10-20 DIAGNOSIS — L03115 Cellulitis of right lower limb: Secondary | ICD-10-CM

## 2018-10-20 DIAGNOSIS — I5032 Chronic diastolic (congestive) heart failure: Secondary | ICD-10-CM | POA: Diagnosis not present

## 2018-10-20 DIAGNOSIS — D649 Anemia, unspecified: Secondary | ICD-10-CM

## 2018-10-20 MED ORDER — CEPHALEXIN 250 MG PO CAPS: 250.0000 mg | ORAL_CAPSULE | Freq: Three times a day (TID) | ORAL | 0 refills | Status: AC
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
## 2018-10-21 LAB — CBC WITH DIFFERENTIAL/PLATELET
Basophils Absolute: 0.1 x10E3/uL (ref 0.0–0.2)
Basos: 1 %
EOS (ABSOLUTE): 0.1 x10E3/uL (ref 0.0–0.4)
Eos: 3 %
Hematocrit: 28.6 % — ABNORMAL LOW (ref 34.0–46.6)
Hemoglobin: 9.1 g/dL — ABNORMAL LOW (ref 11.1–15.9)
Immature Grans (Abs): 0 x10E3/uL (ref 0.0–0.1)
Immature Granulocytes: 0 %
Lymphocytes Absolute: 0.9 x10E3/uL (ref 0.7–3.1)
Lymphs: 22 %
MCH: 28.5 pg (ref 26.6–33.0)
MCHC: 31.8 g/dL (ref 31.5–35.7)
MCV: 90 fL (ref 79–97)
Monocytes Absolute: 0.4 x10E3/uL (ref 0.1–0.9)
Monocytes: 9 %
Neutrophils Absolute: 2.6 x10E3/uL (ref 1.4–7.0)
Neutrophils: 65 %
Platelets: 244 x10E3/uL (ref 150–450)
RBC: 3.19 x10E6/uL — ABNORMAL LOW (ref 3.77–5.28)
RDW: 13 % (ref 11.7–15.4)
WBC: 4 x10E3/uL (ref 3.4–10.8)

## 2018-11-05 ENCOUNTER — Encounter (INDEPENDENT_AMBULATORY_CARE_PROVIDER_SITE_OTHER): Payer: Self-pay | Admitting: Critical Care Medicine

## 2018-11-05 ENCOUNTER — Other Ambulatory Visit: Payer: Self-pay

## 2018-11-05 ENCOUNTER — Ambulatory Visit (INDEPENDENT_AMBULATORY_CARE_PROVIDER_SITE_OTHER): Payer: Medicare Other | Admitting: Critical Care Medicine

## 2018-11-05 VITALS — BP 176/63 | HR 46 | Temp 97.5°F | Ht 67.0 in | Wt 119.6 lb

## 2018-11-05 DIAGNOSIS — I739 Peripheral vascular disease, unspecified: Secondary | ICD-10-CM

## 2018-11-05 DIAGNOSIS — E890 Postprocedural hypothyroidism: Secondary | ICD-10-CM

## 2018-11-05 DIAGNOSIS — E43 Unspecified severe protein-calorie malnutrition: Secondary | ICD-10-CM

## 2018-11-05 DIAGNOSIS — N183 Chronic kidney disease, stage 3 unspecified: Secondary | ICD-10-CM

## 2018-11-05 DIAGNOSIS — I5032 Chronic diastolic (congestive) heart failure: Secondary | ICD-10-CM | POA: Diagnosis not present

## 2018-11-05 DIAGNOSIS — Z8719 Personal history of other diseases of the digestive system: Secondary | ICD-10-CM

## 2018-11-05 DIAGNOSIS — H401113 Primary open-angle glaucoma, right eye, severe stage: Secondary | ICD-10-CM

## 2018-11-05 DIAGNOSIS — I130 Hypertensive heart and chronic kidney disease with heart failure and stage 1 through stage 4 chronic kidney disease, or unspecified chronic kidney disease: Secondary | ICD-10-CM

## 2018-11-05 DIAGNOSIS — E7800 Pure hypercholesterolemia, unspecified: Secondary | ICD-10-CM

## 2018-11-05 DIAGNOSIS — Z8711 Personal history of peptic ulcer disease: Secondary | ICD-10-CM

## 2018-11-05 DIAGNOSIS — D508 Other iron deficiency anemias: Secondary | ICD-10-CM

## 2018-11-05 DIAGNOSIS — Z720 Tobacco use: Secondary | ICD-10-CM

## 2018-11-05 DIAGNOSIS — I10 Essential (primary) hypertension: Secondary | ICD-10-CM

## 2018-11-05 MED ORDER — LOSARTAN POTASSIUM 50 MG PO TABS: 50.0000 mg | ORAL_TABLET | Freq: Every day | ORAL | 3 refills | Status: DC

## 2018-11-05 NOTE — Assessment & Plan Note (Signed)
Chronic kidney disease stable at this time with recent lab check

## 2018-11-05 NOTE — Assessment & Plan Note (Signed)
Iron deficiency anemia with improved hemoglobin  Continue iron supplementation

## 2018-11-05 NOTE — Assessment & Plan Note (Signed)
History of hypertension on a systolic basis We will plan to continue losartan at 50 mg daily note in the chart it was listed at 100 mg of we will rectify the fact that it is a 50 mg dose  We will also continue the Norvasc 10 mg daily

## 2018-11-05 NOTE — Assessment & Plan Note (Signed)
History of Graves' disease with post ablation and now hypothyroidism stable with thyroid supplementation  Note recent TSH was normal

## 2018-11-05 NOTE — Assessment & Plan Note (Signed)
History of protein calorie malnutrition note weight is now stabilized

## 2018-11-05 NOTE — Assessment & Plan Note (Signed)
Chronic diastolic heart failure compensated at this time

## 2018-11-05 NOTE — Assessment & Plan Note (Signed)
History of bilateral femoral-popliteal bypass surgery previously stable at this time  There is decreased perfusion to the left foot but does not appear to be on a severe basis

## 2018-11-05 NOTE — Progress Notes (Signed)
Subjective:    [name], female    DOB: [birth year], 85 y.o.   MRN: [id number]  85 y.o.F with PVD, HTN, chronic diastolic HF.  Last seen 1/21.    The patient returns today for follow-up of her peripheral vascular disease, hypertension and chronic diastolic heart failure.  The patient complains of some headaches and visual changes on the right and was concerned she may be having a stroke.  She was requesting to receive a CT scan of her head.  She has had decreased hearing in the right side.  She notes edema in the right lower extremity.  Her weight is down actually 4 pounds from the last visit on 21 January.  Her appetite is satisfactory.  She lives in a senior housing area and has personal care assistance  The patient has been seen by neurology for her cognitive decline and CT scan in 2017 showed cortical atrophy.  She has had frequent falls in the past.   Also hx of Iron Def Anemia.  On Fe supp Hx of Graves dz now on thyroid suppliement  Past Medical History:  Diagnosis Date  . Acute diastolic congestive heart failure ([place]) 01/28/2013  . AKI (acute kidney injury) ([place]) 10/24/2016  . Anemia 11/30/2014  . Arthritis    lt hip  . Depression   . Diabetes mellitus, type 2 ([place])    Well controlled  . Graves disease    HX OF GRAVES  . High cholesterol   . Hyperlipidemia   . Hypertension   . Intraductal carcinoma 06/2003   Of right breast. s/p right partial mastectomy. // Followed by Dr. Marylene Buerger  . Mesenteric ischemia ([place])   . PVD (peripheral vascular disease) ([place])    S/P BL femoral-popliteal bypass surgery 03/2001 (right) and 01/2003 (left)  . TB lung, latent    Treated with INH in 11/2007  . Thyroid disease    Graves disease  . Transfusion history    last admission 11-30-14  . Ulcer    gastric antral ulcer and AVMs     Family History  Problem Relation Age of Onset  . Cancer Daughter 5       uterine cancer  . Cancer Father   . Dementia Neg Hx       Social History   Socioeconomic History  . Marital status: Widowed    Spouse name: Not on file  . Number of children: 1  . Years of education: Not on file  . Highest education level: 6th grade  Occupational History  . Occupation: Marine scientist    Comment: Systems analyst  Social Needs  . Financial resource strain: Not on file  . Food insecurity:    Worry: Not on file    Inability: Not on file  . Transportation needs:    Medical: Not on file    Non-medical: Not on file  Tobacco Use  . Smoking status: Former Smoker    Packs/day: 0.20    Years: 64.00    Pack years: 12.80    Types: Cigarettes    Last attempt to quit: 12/2017    Years since quitting: 0.8  . Smokeless tobacco: Never Used  . Tobacco comment: started back smoking but cutting back.  Substance and Sexual Activity  . Alcohol use: No    Alcohol/week: 0.0 standard drinks  . Drug use: No  . Sexual activity: Never  Lifestyle  . Physical activity:    Days per week: Not on file  Minutes per session: Not on file  . Stress: Not on file  Relationships  . Social connections:    Talks on phone: Not on file    Gets together: Not on file    Attends religious service: Not on file    Active member of club or organization: Not on file    Attends meetings of clubs or organizations: Not on file    Relationship status: Not on file  . Intimate partner violence:    Fear of current or ex partner: Not on file    Emotionally abused: Not on file    Physically abused: Not on file    Forced sexual activity: Not on file  Other Topics Concern  . Not on file  Social History Narrative   Lives alone with home health aid 7 days a week from 11:30AM to 2 PM.      No Known Allergies   Outpatient Medications Prior to Visit  Medication Sig Dispense Refill  . amLODipine (NORVASC) 10 MG tablet TAKE ONE TABLET BY MOUTH EVERY DAY 30 tablet 1  . atorvastatin (LIPITOR) 20 MG tablet Take 1 tablet (20 mg total) by mouth daily at 6 PM. 90  tablet 1  . Cholecalciferol (VITAMIN D3) 10 MCG (400 UNIT) tablet Take 1 tablet (400 Units total) by mouth daily. 100 tablet 1  . dorzolamide-timolol (COSOPT) 22.3-6.8 MG/ML ophthalmic solution Place 1 drop into both eyes 2 (two) times daily.     . ferrous sulfate 325 (65 FE) MG EC tablet Take 1 tablet (325 mg total) by mouth daily. 100 tablet 3  . furosemide (LASIX) 40 MG tablet Take 1 tablet (40 mg total) by mouth daily. 30 tablet 3  . latanoprost (XALATAN) 0.005 % ophthalmic solution Place 1 drop into both eyes at bedtime.    Marland Kitchen levothyroxine (SYNTHROID, LEVOTHROID) 88 MCG tablet Take 1 tablet (88 mcg total) by mouth daily. 60 tablet 0  . linaclotide (LINZESS) 145 MCG CAPS capsule Take 1 capsule (145 mcg total) by mouth daily before breakfast. 28 capsule 6  . Multiple Vitamin (MULTIVITAMIN WITH MINERALS) TABS tablet Take 1 tablet by mouth daily.    . RHOPRESSA 0.02 % SOLN Place 1 drop into both eyes at bedtime.  11  . losartan (COZAAR) 50 MG tablet Take 2 tablets (100 mg total) by mouth daily. 180 tablet 3   No facility-administered medications prior to visit.       Review of Systems  Constitutional: Negative.   HENT: Negative.   Eyes: Positive for visual disturbance.  Respiratory: Negative for chest tightness and shortness of breath.   Cardiovascular: Positive for leg swelling. Negative for chest pain and palpitations.  Gastrointestinal: Negative.   Genitourinary: Negative.   Musculoskeletal: Negative.   Skin: Negative.   Neurological: Positive for weakness and numbness. Negative for dizziness, syncope, speech difficulty, light-headedness and headaches.       Objective:   Physical Exam Vitals:   11/05/18 1505  BP: (!) 176/63  Pulse: (!) 46  Temp: (!) 97.5 F (36.4 C)  TempSrc: Oral  SpO2: 95%  Weight: 119 lb 9.6 oz (54.3 kg)  Height: 5\' 7"  (1.702 m)    Gen: Pleasant,, thin , in no distress,  normal affect  ENT: No lesions,  mouth clear,  oropharynx clear, no  postnasal drip  Neck: No JVD, no TMG, no carotid bruits  Lungs: No use of accessory muscles, no dullness to percussion, clear without rales or rhonchi  Cardiovascular: RRR, heart sounds normal, no  murmur or gallops, 1+  peripheral edema RLE,  Cool LLE  Abdomen: soft and NT, no HSM,  BS normal  Musculoskeletal: No deformities, no cyanosis or clubbing  Neuro: alert, non focal  Skin: Warm, no lesions or rashes  No results found.  BMP Latest Ref Rng & Units 10/12/2018 06/16/2018 05/31/2018  Glucose 65 - 99 mg/dL 102(H) 103(H) 109(H)  BUN 8 - 27 mg/dL 41(H) 42(H) 32(H)  Creatinine 0.57 - 1.00 mg/dL 1.58(H) 1.50(H) 1.47(H)  BUN/Creat Ratio 12 - 28 26 28  -  Sodium 134 - 144 mmol/L 142 143 140  Potassium 3.5 - 5.2 mmol/L 4.4 5.0 5.3(H)  Chloride 96 - 106 mmol/L 110(H) 110(H) 115(H)  CO2 20 - 29 mmol/L 18(L) 18(L) 21(L)  Calcium 8.7 - 10.3 mg/dL 8.6(L) 9.0 9.1   Lab Results  Component Value Date   WBC 4.0 10/20/2018   HGB 9.1 (L) 10/20/2018   HCT 28.6 (L) 10/20/2018   MCV 90 10/20/2018   PLT 244 10/20/2018        Assessment & Plan:  I personally reviewed all images and lab data in the [facility] system as well as any outside material available during this office visit and agree with the  radiology impressions.   Essential hypertension History of hypertension on a systolic basis We will plan to continue losartan at 50 mg daily note in the chart it was listed at 100 mg of we will rectify the fact that it is a 50 mg dose  We will also continue the Norvasc 10 mg daily    PVD (peripheral vascular disease) ([place]) History of bilateral femoral-popliteal bypass surgery previously stable at this time  There is decreased perfusion to the left foot but does not appear to be on a severe basis  Chronic diastolic congestive heart failure (HCC) Chronic diastolic heart failure compensated at this time  History of gastric ulcer History of gastric ulcer in the past stable at this  time  Postablative hypothyroidism History of Graves' disease with post ablation and now hypothyroidism stable with thyroid supplementation  Note recent TSH was normal  CKD (chronic kidney disease) stage 3, GFR 30-59 ml/min (HCC) Chronic kidney disease stable at this time with recent lab check  HLD (hyperlipidemia) History of hyperlipidemia now at goal we will continue atorvastatin  Protein-calorie malnutrition, severe (HCC) History of protein calorie malnutrition note weight is now stabilized  Iron deficiency anemia Iron deficiency anemia with improved hemoglobin  Continue iron supplementation  Primary open-angle glaucoma Primary open angle glaucoma status post surgery of the eye and now with ongoing eyedrops for glaucoma   Gina [name] was seen today for follow-up.  Diagnoses and all orders for this visit:  Hypertension, unspecified type -     losartan (COZAAR) 50 MG tablet; Take 1 tablet (50 mg total) by mouth daily.  CKD (chronic kidney disease) stage 3, GFR 30-59 ml/min (HCC)  Iron deficiency anemia secondary to inadequate dietary iron intake  Chronic diastolic congestive heart failure (HCC)  Peripheral vascular disease, unspecified (HCC)  Essential hypertension  PVD (peripheral vascular disease) (HCC)  History of gastric ulcer  Postablative hypothyroidism  Pure hypercholesterolemia  Protein-calorie malnutrition, severe ([place])  Primary open angle glaucoma (POAG) of right eye, severe stage

## 2018-11-05 NOTE — Patient Instructions (Signed)
No change in medications Return in 2 months to see Gina [name]

## 2018-11-05 NOTE — Assessment & Plan Note (Signed)
History of gastric ulcer in the past stable at this time

## 2018-11-05 NOTE — Assessment & Plan Note (Signed)
Primary open angle glaucoma status post surgery of the eye and now with ongoing eyedrops for glaucoma

## 2018-11-05 NOTE — Assessment & Plan Note (Signed)
History of hyperlipidemia now at goal we will continue atorvastatin

## 2018-12-03 ENCOUNTER — Encounter (INDEPENDENT_AMBULATORY_CARE_PROVIDER_SITE_OTHER): Payer: Medicare Other | Admitting: Ophthalmology

## 2018-12-03 DIAGNOSIS — I10 Essential (primary) hypertension: Secondary | ICD-10-CM

## 2018-12-03 DIAGNOSIS — H4423 Degenerative myopia, bilateral: Secondary | ICD-10-CM | POA: Diagnosis not present

## 2018-12-03 DIAGNOSIS — H35033 Hypertensive retinopathy, bilateral: Secondary | ICD-10-CM | POA: Diagnosis not present

## 2018-12-15 ENCOUNTER — Ambulatory Visit (INDEPENDENT_AMBULATORY_CARE_PROVIDER_SITE_OTHER): Payer: Medicare Other | Admitting: Internal Medicine

## 2018-12-15 ENCOUNTER — Encounter: Payer: Self-pay | Admitting: Internal Medicine

## 2018-12-15 ENCOUNTER — Other Ambulatory Visit: Payer: Self-pay

## 2018-12-15 VITALS — BP 160/50 | HR 88 | Temp 98.1°F | Ht 67.0 in | Wt 106.0 lb

## 2018-12-15 DIAGNOSIS — E890 Postprocedural hypothyroidism: Secondary | ICD-10-CM | POA: Diagnosis not present

## 2018-12-15 LAB — T4, FREE: Free T4: 1.15 ng/dL (ref 0.60–1.60)

## 2018-12-15 LAB — TSH: TSH: 3.09 u[IU]/mL (ref 0.35–4.50)

## 2018-12-15 MED ORDER — LEVOTHYROXINE SODIUM 88 MCG PO TABS: 88.0000 ug | ORAL_TABLET | Freq: Every day | ORAL | 3 refills | Status: DC

## 2018-12-15 NOTE — Patient Instructions (Addendum)
Please stop at the lab.  Please continue levothyroxine 88 mcg daily.  Take the thyroid hormone every day, with water, at least 30 minutes before breakfast, separated by at least 4 hours from: - acid reflux medications - calcium - iron - multivitamins  Please move Iron at before dinner.  Please come back for a follow-up appointment in 4 months.
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
## 2018-12-28 ENCOUNTER — Other Ambulatory Visit: Payer: Self-pay | Admitting: Primary Care

## 2018-12-28 DIAGNOSIS — K5900 Constipation, unspecified: Secondary | ICD-10-CM

## 2018-12-28 MED ORDER — LINACLOTIDE 145 MCG PO CAPS: ORAL_CAPSULE | ORAL | 2 refills | Status: DC

## 2019-01-01 ENCOUNTER — Telehealth: Payer: Self-pay | Admitting: Physician Assistant

## 2019-01-01 NOTE — Telephone Encounter (Signed)
Patients call returned.  Patient identified by name and date of birth. Patient states that she is feeling weak.  Patient also endorses weakness.  Patient states she has not been taking any of her medications and is barely eating.  Patient does say she is drinking water.  Patient advised to increase her food intake and to start back on her medication.  Triage will call her on Monday am to re-assess.

## 2019-01-01 NOTE — Telephone Encounter (Signed)
patient called and said she was dizzy and weak

## 2019-01-04 ENCOUNTER — Ambulatory Visit: Payer: Medicare Other | Attending: Primary Care | Admitting: Primary Care

## 2019-01-04 ENCOUNTER — Other Ambulatory Visit: Payer: Self-pay

## 2019-01-04 ENCOUNTER — Encounter: Payer: Self-pay | Admitting: Primary Care

## 2019-01-04 DIAGNOSIS — I10 Essential (primary) hypertension: Secondary | ICD-10-CM

## 2019-01-04 DIAGNOSIS — R634 Abnormal weight loss: Secondary | ICD-10-CM | POA: Diagnosis not present

## 2019-01-04 DIAGNOSIS — R630 Anorexia: Secondary | ICD-10-CM | POA: Diagnosis not present

## 2019-01-04 DIAGNOSIS — R5382 Chronic fatigue, unspecified: Secondary | ICD-10-CM

## 2019-01-04 NOTE — Progress Notes (Signed)
Taking both HCTZ and furosemide  Not eating, no appetite x 1 week LBM- unk- last week

## 2019-01-04 NOTE — Telephone Encounter (Signed)
Pt had same day appointment with clinician.

## 2019-01-05 ENCOUNTER — Encounter: Payer: Self-pay | Admitting: Primary Care

## 2019-01-05 MED ORDER — MIRTAZAPINE 7.5 MG PO TABS: 7.5000 mg | ORAL_TABLET | Freq: Every day | ORAL | 3 refills | Status: DC
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
## 2019-01-07 ENCOUNTER — Telehealth: Payer: Self-pay | Admitting: Physician Assistant

## 2019-01-09 ENCOUNTER — Telehealth: Payer: Self-pay | Admitting: Family Medicine

## 2019-01-09 NOTE — Telephone Encounter (Signed)
I was contacted by the on-call service due to patient complaint of recurrent nausea and vomiting.  Patient reported that she had spoken with her PCP on Thursday due to recurrent nausea and vomiting including vomited up a dark substance.  Patient reports that she was instructed to go to urgent care if her symptoms continued.  Per on-call triage service, patient reports continued nausea and vomiting and that the vomitus has been dark-colored and bad tasting.  To your service suggested that patient go to the emergency department however the family member with the patient states that she is afraid to take patient to the emergency department but would be willing to go to urgent care in order for patient to receive IV fluids.  Discussed with the on-call triage that urgent care might possibly treat patient but with patient's age and other medical condition she would likely be trans-ported from urgent care to emergency department and since patient will not go to the emergency department at all, family member can be told to take patient to urgent care so that patient will at least get medical attention.  Patient will be contacted by the triage on-call service to instruct the patient be taken to urgent care but best place for the patient to be evaluated would be in the emergency department.

## 2019-01-11 ENCOUNTER — Telehealth: Payer: Self-pay | Admitting: Primary Care

## 2019-01-11 ENCOUNTER — Ambulatory Visit: Payer: Medicare Other | Admitting: Primary Care

## 2019-01-11 ENCOUNTER — Other Ambulatory Visit: Payer: Self-pay

## 2019-01-11 NOTE — Telephone Encounter (Signed)
Called patient number on file and was not able to reach her. Phone gived a busy tone.

## 2019-01-11 NOTE — Telephone Encounter (Signed)
MA spoke with the provider. Patient is advised to go to the UC or ER for evaluation and treatment.

## 2019-01-11 NOTE — Telephone Encounter (Signed)
New Message   Pt's niece is calling, states that the pt is still having a loss of appetite and wants an in person ov. Please f/u

## 2019-01-11 NOTE — Telephone Encounter (Signed)
I prefer Urgent care but wear a mask. She has a hx of anemia and will drop to a 5 and get really sick fact

## 2019-01-14 ENCOUNTER — Inpatient Hospital Stay (HOSPITAL_COMMUNITY)
Admission: EM | Admit: 2019-01-14 | Discharge: 2019-01-17 | DRG: 682 | Disposition: A | Discharge status: Home Health | Payer: Medicare Other | Attending: Student | Admitting: Student

## 2019-01-14 ENCOUNTER — Emergency Department (HOSPITAL_COMMUNITY): Payer: Medicare Other

## 2019-01-14 ENCOUNTER — Encounter (HOSPITAL_COMMUNITY): Payer: Self-pay

## 2019-01-14 DIAGNOSIS — Z87891 Personal history of nicotine dependence: Secondary | ICD-10-CM

## 2019-01-14 DIAGNOSIS — R627 Adult failure to thrive: Secondary | ICD-10-CM | POA: Diagnosis present

## 2019-01-14 DIAGNOSIS — N179 Acute kidney failure, unspecified: Secondary | ICD-10-CM | POA: Diagnosis not present

## 2019-01-14 DIAGNOSIS — Z681 Body mass index (BMI) 19 or less, adult: Secondary | ICD-10-CM

## 2019-01-14 DIAGNOSIS — D509 Iron deficiency anemia, unspecified: Secondary | ICD-10-CM | POA: Diagnosis present

## 2019-01-14 DIAGNOSIS — E7800 Pure hypercholesterolemia, unspecified: Secondary | ICD-10-CM | POA: Diagnosis present

## 2019-01-14 DIAGNOSIS — I10 Essential (primary) hypertension: Secondary | ICD-10-CM | POA: Diagnosis present

## 2019-01-14 DIAGNOSIS — N1330 Unspecified hydronephrosis: Secondary | ICD-10-CM | POA: Diagnosis present

## 2019-01-14 DIAGNOSIS — D649 Anemia, unspecified: Secondary | ICD-10-CM | POA: Diagnosis not present

## 2019-01-14 DIAGNOSIS — I130 Hypertensive heart and chronic kidney disease with heart failure and stage 1 through stage 4 chronic kidney disease, or unspecified chronic kidney disease: Secondary | ICD-10-CM | POA: Diagnosis present

## 2019-01-14 DIAGNOSIS — E785 Hyperlipidemia, unspecified: Secondary | ICD-10-CM | POA: Diagnosis present

## 2019-01-14 DIAGNOSIS — E43 Unspecified severe protein-calorie malnutrition: Secondary | ICD-10-CM | POA: Diagnosis present

## 2019-01-14 DIAGNOSIS — Z79899 Other long term (current) drug therapy: Secondary | ICD-10-CM

## 2019-01-14 DIAGNOSIS — Z961 Presence of intraocular lens: Secondary | ICD-10-CM | POA: Diagnosis present

## 2019-01-14 DIAGNOSIS — I110 Hypertensive heart disease with heart failure: Secondary | ICD-10-CM

## 2019-01-14 DIAGNOSIS — E890 Postprocedural hypothyroidism: Secondary | ICD-10-CM | POA: Diagnosis present

## 2019-01-14 DIAGNOSIS — R531 Weakness: Secondary | ICD-10-CM | POA: Diagnosis not present

## 2019-01-14 DIAGNOSIS — E1151 Type 2 diabetes mellitus with diabetic peripheral angiopathy without gangrene: Secondary | ICD-10-CM | POA: Diagnosis present

## 2019-01-14 DIAGNOSIS — E1122 Type 2 diabetes mellitus with diabetic chronic kidney disease: Secondary | ICD-10-CM | POA: Diagnosis present

## 2019-01-14 DIAGNOSIS — K259 Gastric ulcer, unspecified as acute or chronic, without hemorrhage or perforation: Secondary | ICD-10-CM | POA: Diagnosis present

## 2019-01-14 DIAGNOSIS — Z79890 Hormone replacement therapy (postmenopausal): Secondary | ICD-10-CM

## 2019-01-14 DIAGNOSIS — E119 Type 2 diabetes mellitus without complications: Secondary | ICD-10-CM

## 2019-01-14 DIAGNOSIS — Z95820 Peripheral vascular angioplasty status with implants and grafts: Secondary | ICD-10-CM

## 2019-01-14 DIAGNOSIS — H401130 Primary open-angle glaucoma, bilateral, stage unspecified: Secondary | ICD-10-CM | POA: Diagnosis present

## 2019-01-14 DIAGNOSIS — I5032 Chronic diastolic (congestive) heart failure: Secondary | ICD-10-CM | POA: Diagnosis present

## 2019-01-14 DIAGNOSIS — R7989 Other specified abnormal findings of blood chemistry: Secondary | ICD-10-CM | POA: Diagnosis present

## 2019-01-14 DIAGNOSIS — R778 Other specified abnormalities of plasma proteins: Secondary | ICD-10-CM | POA: Diagnosis present

## 2019-01-14 DIAGNOSIS — E860 Dehydration: Secondary | ICD-10-CM | POA: Diagnosis present

## 2019-01-14 DIAGNOSIS — D631 Anemia in chronic kidney disease: Secondary | ICD-10-CM | POA: Diagnosis present

## 2019-01-14 DIAGNOSIS — Z8615 Personal history of latent tuberculosis infection: Secondary | ICD-10-CM

## 2019-01-14 DIAGNOSIS — N183 Chronic kidney disease, stage 3 (moderate): Secondary | ICD-10-CM | POA: Diagnosis present

## 2019-01-14 LAB — URINALYSIS, ROUTINE W REFLEX MICROSCOPIC
Bilirubin Urine: NEGATIVE
Glucose, UA: NEGATIVE mg/dL
Hgb urine dipstick: NEGATIVE
Ketones, ur: NEGATIVE mg/dL
Nitrite: NEGATIVE
Protein, ur: NEGATIVE mg/dL
Specific Gravity, Urine: 1.01 (ref 1.005–1.030)
pH: 5 (ref 5.0–8.0)

## 2019-01-14 LAB — CBC WITH DIFFERENTIAL/PLATELET
Abs Immature Granulocytes: 0.01 10*3/uL (ref 0.00–0.07)
Basophils Absolute: 0 10*3/uL (ref 0.0–0.1)
Basophils Relative: 1 %
Eosinophils Absolute: 0 10*3/uL (ref 0.0–0.5)
Eosinophils Relative: 1 %
HCT: 24 % — ABNORMAL LOW (ref 36.0–46.0)
Hemoglobin: 7.4 g/dL — ABNORMAL LOW (ref 12.0–15.0)
Immature Granulocytes: 0 %
Lymphocytes Relative: 19 %
Lymphs Abs: 0.8 10*3/uL (ref 0.7–4.0)
MCH: 27.6 pg (ref 26.0–34.0)
MCHC: 30.8 g/dL (ref 30.0–36.0)
MCV: 89.6 fL (ref 80.0–100.0)
Monocytes Absolute: 0.3 10*3/uL (ref 0.1–1.0)
Monocytes Relative: 8 %
Neutro Abs: 3 10*3/uL (ref 1.7–7.7)
Neutrophils Relative %: 71 %
Platelets: 215 10*3/uL (ref 150–400)
RBC: 2.68 MIL/uL — ABNORMAL LOW (ref 3.87–5.11)
RDW: 14.7 % (ref 11.5–15.5)
WBC: 4.2 10*3/uL (ref 4.0–10.5)
nRBC: 0 % (ref 0.0–0.2)

## 2019-01-14 LAB — I-STAT TROPONIN, ED: Troponin i, poc: 0.07 ng/mL (ref 0.00–0.08)

## 2019-01-14 LAB — COMPREHENSIVE METABOLIC PANEL
ALT: 9 U/L (ref 0–44)
AST: 16 U/L (ref 15–41)
Albumin: 3.7 g/dL (ref 3.5–5.0)
Alkaline Phosphatase: 43 U/L (ref 38–126)
Anion gap: 15 (ref 5–15)
BUN: 150 mg/dL — ABNORMAL HIGH (ref 8–23)
CO2: 20 mmol/L — ABNORMAL LOW (ref 22–32)
Calcium: 8.9 mg/dL (ref 8.9–10.3)
Chloride: 100 mmol/L (ref 98–111)
Creatinine, Ser: 4.91 mg/dL — ABNORMAL HIGH (ref 0.44–1.00)
GFR calc Af Amer: 9 mL/min — ABNORMAL LOW (ref >60)
GFR calc non Af Amer: 7 mL/min — ABNORMAL LOW (ref >60)
Glucose, Bld: 129 mg/dL — ABNORMAL HIGH (ref 70–99)
Potassium: 4.7 mmol/L (ref 3.5–5.1)
Sodium: 135 mmol/L (ref 135–145)
Total Bilirubin: 0.6 mg/dL (ref 0.3–1.2)
Total Protein: 6.8 g/dL (ref 6.5–8.1)

## 2019-01-14 LAB — PROTIME-INR
INR: 1.1 (ref 0.8–1.2)
Prothrombin Time: 13.7 seconds (ref 11.4–15.2)

## 2019-01-14 LAB — POC OCCULT BLOOD, ED: Fecal Occult Bld: NEGATIVE

## 2019-01-14 LAB — LIPASE, BLOOD: Lipase: 34 U/L (ref 11–51)

## 2019-01-14 MED ORDER — SODIUM CHLORIDE 0.9 % IV BOLUS
500.0000 mL | Freq: Once | INTRAVENOUS | Status: AC
  Administered 2019-01-14: 500 mL via INTRAVENOUS

## 2019-01-14 MED ORDER — LINACLOTIDE 145 MCG PO CAPS: 145.0000 ug | ORAL_CAPSULE | Freq: Every day | ORAL | Status: DC

## 2019-01-14 MED ORDER — LEVOTHYROXINE SODIUM 88 MCG PO TABS
88.0000 ug | ORAL_TABLET | Freq: Every day | ORAL | Status: DC
  Administered 2019-01-15 – 2019-01-17 (×3): 88 ug via ORAL
  Filled 2019-01-14 (×3): qty 1

## 2019-01-14 MED ORDER — ONDANSETRON HCL 4 MG/2ML IJ SOLN: 4.0000 mg | Freq: Four times a day (QID) | INTRAMUSCULAR | Status: DC | PRN

## 2019-01-14 MED ORDER — HEPARIN SODIUM (PORCINE) 5000 UNIT/ML IJ SOLN
5000.0000 [IU] | Freq: Three times a day (TID) | INTRAMUSCULAR | Status: DC
  Administered 2019-01-15 (×2): 5000 [IU] via SUBCUTANEOUS
  Filled 2019-01-14: qty 1

## 2019-01-14 MED ORDER — ACETAMINOPHEN 650 MG RE SUPP: 650.0000 mg | Freq: Four times a day (QID) | RECTAL | Status: DC | PRN

## 2019-01-14 MED ORDER — ACETAMINOPHEN 325 MG PO TABS: 650.0000 mg | ORAL_TABLET | Freq: Four times a day (QID) | ORAL | Status: DC | PRN

## 2019-01-14 MED ORDER — NETARSUDIL DIMESYLATE 0.02 % OP SOLN: 1.0000 [drp] | Freq: Every day | OPHTHALMIC | Status: DC

## 2019-01-14 MED ORDER — ONDANSETRON HCL 4 MG PO TABS: 4.0000 mg | ORAL_TABLET | Freq: Four times a day (QID) | ORAL | Status: DC | PRN

## 2019-01-14 MED ORDER — SODIUM CHLORIDE 0.9 % IV SOLN
Freq: Once | INTRAVENOUS | Status: AC
  Administered 2019-01-14: 1000 mL via INTRAVENOUS

## 2019-01-14 MED ORDER — MIRTAZAPINE 7.5 MG PO TABS
7.5000 mg | ORAL_TABLET | Freq: Every day | ORAL | Status: DC
  Administered 2019-01-15 – 2019-01-16 (×3): 7.5 mg via ORAL
  Filled 2019-01-14 (×4): qty 1

## 2019-01-14 MED ORDER — HYDRALAZINE HCL 20 MG/ML IJ SOLN: 5.0000 mg | INTRAMUSCULAR | Status: DC | PRN

## 2019-01-14 MED ORDER — AMLODIPINE BESYLATE 10 MG PO TABS
10.0000 mg | ORAL_TABLET | Freq: Every day | ORAL | Status: DC
  Administered 2019-01-15 – 2019-01-17 (×3): 10 mg via ORAL
  Filled 2019-01-14 (×3): qty 1

## 2019-01-14 MED ORDER — FERROUS SULFATE 325 (65 FE) MG PO TABS
325.0000 mg | ORAL_TABLET | Freq: Every day | ORAL | Status: DC
  Administered 2019-01-15 – 2019-01-17 (×3): 325 mg via ORAL
  Filled 2019-01-14 (×3): qty 1

## 2019-01-14 MED ORDER — SODIUM CHLORIDE 0.9 % IV SOLN
INTRAVENOUS | Status: AC
  Administered 2019-01-15: 01:00:00 via INTRAVENOUS

## 2019-01-14 MED ORDER — DORZOLAMIDE HCL-TIMOLOL MAL 2-0.5 % OP SOLN
1.0000 [drp] | Freq: Two times a day (BID) | OPHTHALMIC | Status: DC
  Administered 2019-01-15 – 2019-01-17 (×5): 1 [drp] via OPHTHALMIC
  Filled 2019-01-14: qty 10

## 2019-01-14 MED ORDER — ATORVASTATIN CALCIUM 10 MG PO TABS
20.0000 mg | ORAL_TABLET | Freq: Every day | ORAL | Status: DC
  Administered 2019-01-16: 17:00:00 20 mg via ORAL
  Filled 2019-01-14: qty 2

## 2019-01-14 MED ORDER — LATANOPROST 0.005 % OP SOLN
1.0000 [drp] | Freq: Every day | OPHTHALMIC | Status: DC
  Administered 2019-01-15 – 2019-01-16 (×3): 1 [drp] via OPHTHALMIC
  Filled 2019-01-14: qty 2.5

## 2019-01-14 NOTE — ED Notes (Signed)
ED Provider at bedside. 
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
## 2019-01-14 NOTE — ED Notes (Signed)
RN attempted to start IV; 2nd RN to start line-Monique,RN

## 2019-01-14 NOTE — ED Notes (Signed)
pts niece, [name], would like a call about pts dispo when possible at [phone]

## 2019-01-14 NOTE — ED Triage Notes (Signed)
Pt states that she has had generalized weakness for the past month, reports recent constipation, last BM two days ago. Denies fevers, denies cough. C/o of pain to the L flank, denies urinary symptoms.
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
## 2019-01-14 NOTE — Telephone Encounter (Signed)
Patient niece called to notify PCP that patient will be going to the ED.
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
## 2019-01-15 ENCOUNTER — Observation Stay (HOSPITAL_COMMUNITY): Payer: Medicare Other

## 2019-01-15 DIAGNOSIS — I130 Hypertensive heart and chronic kidney disease with heart failure and stage 1 through stage 4 chronic kidney disease, or unspecified chronic kidney disease: Secondary | ICD-10-CM | POA: Diagnosis present

## 2019-01-15 DIAGNOSIS — Z961 Presence of intraocular lens: Secondary | ICD-10-CM | POA: Diagnosis present

## 2019-01-15 DIAGNOSIS — R778 Other specified abnormalities of plasma proteins: Secondary | ICD-10-CM | POA: Diagnosis present

## 2019-01-15 DIAGNOSIS — N183 Chronic kidney disease, stage 3 (moderate): Secondary | ICD-10-CM

## 2019-01-15 DIAGNOSIS — E890 Postprocedural hypothyroidism: Secondary | ICD-10-CM | POA: Diagnosis present

## 2019-01-15 DIAGNOSIS — I5032 Chronic diastolic (congestive) heart failure: Secondary | ICD-10-CM

## 2019-01-15 DIAGNOSIS — I10 Essential (primary) hypertension: Secondary | ICD-10-CM | POA: Diagnosis not present

## 2019-01-15 DIAGNOSIS — R7989 Other specified abnormal findings of blood chemistry: Secondary | ICD-10-CM | POA: Diagnosis present

## 2019-01-15 DIAGNOSIS — E119 Type 2 diabetes mellitus without complications: Secondary | ICD-10-CM | POA: Diagnosis not present

## 2019-01-15 DIAGNOSIS — N1330 Unspecified hydronephrosis: Secondary | ICD-10-CM | POA: Diagnosis present

## 2019-01-15 DIAGNOSIS — Z87891 Personal history of nicotine dependence: Secondary | ICD-10-CM | POA: Diagnosis not present

## 2019-01-15 DIAGNOSIS — E1122 Type 2 diabetes mellitus with diabetic chronic kidney disease: Secondary | ICD-10-CM | POA: Diagnosis present

## 2019-01-15 DIAGNOSIS — H401130 Primary open-angle glaucoma, bilateral, stage unspecified: Secondary | ICD-10-CM | POA: Diagnosis present

## 2019-01-15 DIAGNOSIS — D631 Anemia in chronic kidney disease: Secondary | ICD-10-CM | POA: Diagnosis present

## 2019-01-15 DIAGNOSIS — E43 Unspecified severe protein-calorie malnutrition: Secondary | ICD-10-CM | POA: Diagnosis present

## 2019-01-15 DIAGNOSIS — D649 Anemia, unspecified: Secondary | ICD-10-CM | POA: Diagnosis not present

## 2019-01-15 DIAGNOSIS — R627 Adult failure to thrive: Secondary | ICD-10-CM | POA: Diagnosis present

## 2019-01-15 DIAGNOSIS — Z79890 Hormone replacement therapy (postmenopausal): Secondary | ICD-10-CM | POA: Diagnosis not present

## 2019-01-15 DIAGNOSIS — R531 Weakness: Secondary | ICD-10-CM | POA: Diagnosis present

## 2019-01-15 DIAGNOSIS — K259 Gastric ulcer, unspecified as acute or chronic, without hemorrhage or perforation: Secondary | ICD-10-CM | POA: Diagnosis present

## 2019-01-15 DIAGNOSIS — D509 Iron deficiency anemia, unspecified: Secondary | ICD-10-CM | POA: Diagnosis present

## 2019-01-15 DIAGNOSIS — E860 Dehydration: Secondary | ICD-10-CM | POA: Diagnosis present

## 2019-01-15 DIAGNOSIS — E1151 Type 2 diabetes mellitus with diabetic peripheral angiopathy without gangrene: Secondary | ICD-10-CM | POA: Diagnosis present

## 2019-01-15 DIAGNOSIS — E785 Hyperlipidemia, unspecified: Secondary | ICD-10-CM | POA: Diagnosis present

## 2019-01-15 DIAGNOSIS — E7800 Pure hypercholesterolemia, unspecified: Secondary | ICD-10-CM | POA: Diagnosis present

## 2019-01-15 DIAGNOSIS — Z681 Body mass index (BMI) 19 or less, adult: Secondary | ICD-10-CM | POA: Diagnosis not present

## 2019-01-15 DIAGNOSIS — Z95820 Peripheral vascular angioplasty status with implants and grafts: Secondary | ICD-10-CM | POA: Diagnosis not present

## 2019-01-15 DIAGNOSIS — N179 Acute kidney failure, unspecified: Secondary | ICD-10-CM | POA: Diagnosis present

## 2019-01-15 DIAGNOSIS — Z8615 Personal history of latent tuberculosis infection: Secondary | ICD-10-CM | POA: Diagnosis not present

## 2019-01-15 LAB — RETICULOCYTES
Immature Retic Fract: 13.8 % (ref 2.3–15.9)
RBC.: 2.73 MIL/uL — ABNORMAL LOW (ref 3.87–5.11)
Retic Count, Absolute: 34.9 10*3/uL (ref 19.0–186.0)
Retic Ct Pct: 1.3 % (ref 0.4–3.1)

## 2019-01-15 LAB — CBC WITH DIFFERENTIAL/PLATELET
Abs Immature Granulocytes: 0.01 10*3/uL (ref 0.00–0.07)
Basophils Absolute: 0 10*3/uL (ref 0.0–0.1)
Basophils Relative: 1 %
Eosinophils Absolute: 0.1 10*3/uL (ref 0.0–0.5)
Eosinophils Relative: 2 %
HCT: 19.9 % — ABNORMAL LOW (ref 36.0–46.0)
Hemoglobin: 6.5 g/dL — CL (ref 12.0–15.0)
Immature Granulocytes: 0 %
Lymphocytes Relative: 28 %
Lymphs Abs: 1.2 10*3/uL (ref 0.7–4.0)
MCH: 28.6 pg (ref 26.0–34.0)
MCHC: 32.7 g/dL (ref 30.0–36.0)
MCV: 87.7 fL (ref 80.0–100.0)
Monocytes Absolute: 0.5 10*3/uL (ref 0.1–1.0)
Monocytes Relative: 11 %
Neutro Abs: 2.4 10*3/uL (ref 1.7–7.7)
Neutrophils Relative %: 58 %
Platelets: 199 10*3/uL (ref 150–400)
RBC: 2.27 MIL/uL — ABNORMAL LOW (ref 3.87–5.11)
RDW: 14.6 % (ref 11.5–15.5)
WBC: 4.2 10*3/uL (ref 4.0–10.5)
nRBC: 0 % (ref 0.0–0.2)

## 2019-01-15 LAB — BASIC METABOLIC PANEL
Anion gap: 12 (ref 5–15)
BUN: 142 mg/dL — ABNORMAL HIGH (ref 8–23)
CO2: 24 mmol/L (ref 22–32)
Calcium: 8.4 mg/dL — ABNORMAL LOW (ref 8.9–10.3)
Chloride: 103 mmol/L (ref 98–111)
Creatinine, Ser: 4.07 mg/dL — ABNORMAL HIGH (ref 0.44–1.00)
GFR calc Af Amer: 11 mL/min — ABNORMAL LOW (ref >60)
GFR calc non Af Amer: 9 mL/min — ABNORMAL LOW (ref >60)
Glucose, Bld: 99 mg/dL (ref 70–99)
Potassium: 4 mmol/L (ref 3.5–5.1)
Sodium: 139 mmol/L (ref 135–145)

## 2019-01-15 LAB — HEPATIC FUNCTION PANEL
ALT: 9 U/L (ref 0–44)
AST: 13 U/L — ABNORMAL LOW (ref 15–41)
Albumin: 3.1 g/dL — ABNORMAL LOW (ref 3.5–5.0)
Alkaline Phosphatase: 37 U/L — ABNORMAL LOW (ref 38–126)
Bilirubin, Direct: <0.1 mg/dL (ref 0.0–0.2)
Total Bilirubin: 0.6 mg/dL (ref 0.3–1.2)
Total Protein: 5.9 g/dL — ABNORMAL LOW (ref 6.5–8.1)

## 2019-01-15 LAB — CREATININE, URINE, RANDOM: Creatinine, Urine: 158.59 mg/dL

## 2019-01-15 LAB — IRON AND TIBC
Iron: 43 ug/dL (ref 28–170)
Saturation Ratios: 12 % (ref 10.4–31.8)
TIBC: 365 ug/dL (ref 250–450)
UIBC: 322 ug/dL

## 2019-01-15 LAB — TROPONIN I
Troponin I: 0.05 ng/mL (ref <0.03)
Troponin I: 0.05 ng/mL (ref <0.03)
Troponin I: 0.05 ng/mL (ref <0.03)
Troponin I: 0.07 ng/mL (ref <0.03)

## 2019-01-15 LAB — CK: Total CK: 102 U/L (ref 38–234)

## 2019-01-15 LAB — VITAMIN B12: Vitamin B-12: 2362 pg/mL — ABNORMAL HIGH (ref 180–914)

## 2019-01-15 LAB — FERRITIN: Ferritin: 15 ng/mL (ref 11–307)

## 2019-01-15 LAB — PREPARE RBC (CROSSMATCH)

## 2019-01-15 LAB — SODIUM, URINE, RANDOM: Sodium, Ur: 50 mmol/L

## 2019-01-15 LAB — FOLATE: Folate: 8.8 ng/mL (ref >5.9)

## 2019-01-15 LAB — TSH: TSH: 1.918 u[IU]/mL (ref 0.350–4.500)

## 2019-01-15 MED ORDER — PANTOPRAZOLE SODIUM 40 MG IV SOLR
40.0000 mg | Freq: Two times a day (BID) | INTRAVENOUS | Status: DC
  Administered 2019-01-15: 40 mg via INTRAVENOUS
  Filled 2019-01-15: qty 40

## 2019-01-15 MED ORDER — ADULT MULTIVITAMIN W/MINERALS CH
1.0000 | ORAL_TABLET | Freq: Every day | ORAL | Status: DC
  Administered 2019-01-16 – 2019-01-17 (×2): 1 via ORAL
  Filled 2019-01-15 (×2): qty 1

## 2019-01-15 MED ORDER — SODIUM CHLORIDE 0.9% IV SOLUTION
Freq: Once | INTRAVENOUS | Status: AC
  Administered 2019-01-15: 09:00:00 via INTRAVENOUS

## 2019-01-15 MED ORDER — ENSURE ENLIVE PO LIQD
237.0000 mL | Freq: Two times a day (BID) | ORAL | Status: DC
  Administered 2019-01-15 – 2019-01-17 (×3): 237 mL via ORAL

## 2019-01-15 MED ORDER — PANTOPRAZOLE SODIUM 40 MG PO TBEC
40.0000 mg | DELAYED_RELEASE_TABLET | Freq: Two times a day (BID) | ORAL | Status: DC
  Administered 2019-01-15 – 2019-01-16 (×2): 40 mg via ORAL
  Filled 2019-01-15 (×2): qty 1

## 2019-01-15 NOTE — Progress Notes (Signed)
Initial Nutrition Assessment  DOCUMENTATION CODES:   Severe malnutrition in context of chronic illness, Underweight  INTERVENTION:   -Ensure Enlive po BID, each supplement provides 350 kcal and 20 grams of protein -MVI with minerals daily  NUTRITION DIAGNOSIS:   Severe Malnutrition related to chronic illness(CKD 3) as evidenced by percent weight loss, severe fat depletion, severe muscle depletion.  GOAL:   Patient will meet greater than or equal to 90% of their needs  MONITOR:   PO intake, Supplement acceptance, Weight trends, Skin, I & O's  REASON FOR ASSESSMENT:   Other (Comment)    ASSESSMENT:   Gina [name] is a 85 y.o. female with history of hypertension, chronic kidney disease stage III, peripheral vascular disease, post ablative hypothyroidism, chronic anemia presents to the ER after patient has been feeling weak for the last 2 weeks.  Patient states she has been progressively getting weaker but has been able to eat.  2 weeks ago she threw up some dark-colored fluid twice.  Denies any abdominal pain diarrhea chest pain shortness of breath fever or chills.  She feels weak but is able to ambulate without help as per the patient.  Did not have any fall or loss of consciousness.  Has not had any change in medications.  Pt admitted with acute renal failure.   Reviewed I/O's: +194 ml x 24 hours  UOP: 550 ml x 24 hours  Pt sleeping soundly at time of visit. She did not respond to voice or touch.   Noted meal tray on tray table was unattempted.   Reviewed wt hx; pt has experienced a 21.2% wt loss over the past 3 months, which is significant for time frame.    Labs reviewed.   NUTRITION - FOCUSED PHYSICAL EXAM:    Most Recent Value  Orbital Region  Severe depletion  Upper Arm Region  Severe depletion  Thoracic and Lumbar Region  Severe depletion  Buccal Region  Severe depletion  Temple Region  Severe depletion  Clavicle Bone Region  Severe depletion  Clavicle  and Acromion Bone Region  Severe depletion  Scapular Bone Region  Severe depletion  Dorsal Hand  Severe depletion  Patellar Region  Severe depletion  Anterior Thigh Region  Severe depletion  Posterior Calf Region  Severe depletion  Edema (RD Assessment)  None  Hair  Reviewed  Eyes  Reviewed  Mouth  Reviewed  Skin  Reviewed  Nails  Reviewed       Diet Order:   Diet Order            Diet renal with fluid restriction Fluid restriction: 1200 mL Fluid; Room service appropriate? Yes; Fluid consistency: Thin  Diet effective now              EDUCATION NEEDS:   No education needs have been identified at this time  Skin:  Skin Assessment: Reviewed RN Assessment  Last BM:  Unknown  Height:   Ht Readings from Last 1 Encounters:  01/14/19 5\' 7"  (1.702 m)    Weight:   Wt Readings from Last 1 Encounters:  01/15/19 44.2 kg    Ideal Body Weight:  61.4 kg  BMI:  Body mass index is 15.27 kg/m.  Estimated Nutritional Needs:   Kcal:  1300-1500  Protein:  50-65 grams  Fluid:  1.2 L    Loys Hoselton A. Jimmye Norman, RD, LDN, [place] Registered Dietitian II Certified Diabetes Care and Education Specialist Pager: [phone] After hours Pager: [phone]

## 2019-01-15 NOTE — Plan of Care (Signed)

## 2019-01-15 NOTE — Plan of Care (Signed)
  Problem: Education: Goal: Knowledge of General Education information will improve Description Including pain rating scale, medication(s)/side effects and non-pharmacologic comfort measures 01/15/2019 1007 by Yehuda Mao, RN Outcome: Progressing 01/15/2019 1007 by Yehuda Mao, RN Outcome: Progressing   Problem: Health Behavior/Discharge Planning: Goal: Ability to manage health-related needs will improve 01/15/2019 1007 by Yehuda Mao, RN Outcome: Progressing 01/15/2019 1007 by Yehuda Mao, RN Outcome: Progressing   Problem: Clinical Measurements: Goal: Ability to maintain clinical measurements within normal limits will improve 01/15/2019 1007 by Yehuda Mao, RN Outcome: Progressing 01/15/2019 1007 by Yehuda Mao, RN Outcome: Progressing Goal: Will remain free from infection 01/15/2019 1007 by Yehuda Mao, RN Outcome: Progressing 01/15/2019 1007 by Yehuda Mao, RN Outcome: Progressing Goal: Diagnostic test results will improve 01/15/2019 1007 by Yehuda Mao, RN Outcome: Progressing 01/15/2019 1007 by Yehuda Mao, RN Outcome: Progressing Goal: Respiratory complications will improve 01/15/2019 1007 by Yehuda Mao, RN Outcome: Progressing 01/15/2019 1007 by Yehuda Mao, RN Outcome: Progressing Goal: Cardiovascular complication will be avoided 01/15/2019 1007 by Yehuda Mao, RN Outcome: Progressing 01/15/2019 1007 by Yehuda Mao, RN Outcome: Progressing   Problem: Activity: Goal: Risk for activity intolerance will decrease 01/15/2019 1007 by Yehuda Mao, RN Outcome: Progressing 01/15/2019 1007 by Yehuda Mao, RN Outcome: Progressing   Problem: Nutrition: Goal: Adequate nutrition will be maintained 01/15/2019 1007 by Yehuda Mao, RN Outcome: Progressing 01/15/2019 1007 by Yehuda Mao, RN Outcome: Progressing   Problem: Coping: Goal: Level of anxiety will decrease 01/15/2019 1007 by Yehuda Mao, RN Outcome: Progressing 01/15/2019 1007 by Yehuda Mao, RN Outcome: Progressing   Problem: Elimination: Goal: Will not experience complications related to bowel motility 01/15/2019 1007 by Yehuda Mao, RN Outcome: Progressing 01/15/2019 1007 by Yehuda Mao, RN Outcome: Progressing Goal: Will not experience complications related to urinary retention 01/15/2019 1007 by Yehuda Mao, RN Outcome: Progressing 01/15/2019 1007 by Yehuda Mao, RN Outcome: Progressing   Problem: Pain Managment: Goal: General experience of comfort will improve 01/15/2019 1007 by Yehuda Mao, RN Outcome: Progressing 01/15/2019 1007 by Yehuda Mao, RN Outcome: Progressing   Problem: Safety: Goal: Ability to remain free from injury will improve 01/15/2019 1007 by Yehuda Mao, RN Outcome: Progressing 01/15/2019 1007 by Yehuda Mao, RN Outcome: Progressing   Problem: Skin Integrity: Goal: Risk for impaired skin integrity will decrease 01/15/2019 1007 by Yehuda Mao, RN Outcome: Progressing 01/15/2019 1007 by Yehuda Mao, RN Outcome: Progressing

## 2019-01-15 NOTE — Progress Notes (Signed)
Patient's hemoglobin is 6.5, MD notified. Troponin is 0.05, she is asymptomatic. MD aware. Will continue.

## 2019-01-15 NOTE — Progress Notes (Addendum)
Progress Note    Gina [name]  KDT:[id number] DOB: 11-07-1928  DOA: 01/14/2019 PCP: Clent Demark, PA-C    Brief Narrative:   Chief complaint: Weakness  Medical records reviewed and are as summarized below:  Gina [name] is an 85 y.o. female with past medical history significant for HTN, CKD stage III, PVD, post ablative hypothyroidism, and chronic anemia requiring transfusions; who presented with generalized weakness.  Patient reporting dark stools, but stool guaiac negative.  Creatinine worsened from 1.5 to 4.9.   Assessment/Plan:   Principal Problem:   ARF (acute renal failure) (HCC) Active Problems:   Essential hypertension   Chronic diastolic congestive heart failure (HCC)   Normochromic normocytic anemia  Acute renal failure superimposed on chronic kidney disease stage III: Patient presents with worsening creatinine from 1.5 in January 2020 to  4.9 on admission with BUN 150.  Patient was started on IV fluids of 100 mL/h.  CK 107. creatinine improving down to 4.07.  Suspect secondary to dehydration.  Renal ultrasound did note some mild right hydronephrosis. -Continue normal saline IV fluids and 100 mL/h -Strict ins and outs -Avoid nephrotoxic agents -Bladder scan every shift with orders to place Foley catheter if urinary retention appreciated post void -Recheck BMP tomorrow  Anemia of chronic disease: Hemoglobin 7.4 on admission down to 6.5 today.  Patient ordered to be transfused 1 unit of packed red blood cells.  Elevated BUN could give concern for upper GI bleed, but stool guaiacs were noted to be negative.   -Continue to monitor and transfuse blood products as needed   Elevated troponin: Troponin 0.05 ->0.07. No complaints of chest pain at this time. -Continue to monitor  Chronic diastolic congestive heart failure: Last echocardiogram revealed EF noted to be 55 to 99% grade 2 diastolic dysfunction in 04/3381.  Patient appears to be hypovolemic at this  time. -Continue to monitor  Essential hypertension: Blood pressures currently stable on 112/42 -Holding ARB and hydrochlorothiazide  Generalized weakness: Likely secondary to dehydration. -Physical therapy   Diabetes mellitus type 2: Currently not on any medications for treatment.  Last hemoglobin A1c 6.2 on 10/12/2018. -Continue to monitor  Peptic ulcer disease history -Change Protonix from IV to p.o.  Peripheral vascular disease -Continue statin  Body mass index is 15.27 kg/m.   Family Communication/Anticipated D/C date and plan/Code Status   DVT prophylaxis: SCDs Code Status: Full Code.  Family Communication: No family present at bedside Disposition Plan: Discharge home in 2 to 3 days once kidney function improves.   Medical Consultants:    None.   Anti-Infectives:    None  Subjective:   Patient reports that she has had no black stools overnight.  Complains of chills and needing a blood transfusion.  Denies any complaints of shortness of breath.   Objective:    Vitals:   01/15/19 0615 01/15/19 0830 01/15/19 0902 01/15/19 1100  BP: (!) 108/47 (!) 133/28 (!) 110/39 (!) 112/42  Pulse: (!) 50 97 62 70  Resp: 18 16 18 16   Temp: 98.1 F (36.7 C) 99.1 F (37.3 C) 97.9 F (36.6 C) 98.4 F (36.9 C)  TempSrc: Oral Oral Oral Oral  SpO2: 100% 99% 99% 98%  Weight:      Height:        Intake/Output Summary (Last 24 hours) at 01/15/2019 1140 Last data filed at 01/15/2019 1012 Gross per 24 hour  Intake 743.68 ml  Output 950 ml  Net -206.32 ml   [employer]  01/18/19 0013  Weight: 44.2 kg    Exam: Constitutional: Frail elderly female in NAD, calm, comfortable Eyes: PERRL, lids and conjunctivae normal ENMT: Mucous membranes are moist. Posterior pharynx clear of any exudate or lesions.  Neck: normal, supple, no masses, no thyromegaly Respiratory: clear to auscultation bilaterally, no wheezing, no crackles. Normal respiratory effort. No accessory muscle  use.  Cardiovascular: Regular rate and rhythm, no murmurs / rubs / gallops. No extremity edema. 2+ pedal pulses. No carotid bruits.  Abdomen: no tenderness, no masses palpated. No hepatosplenomegaly. Bowel sounds positive.  Musculoskeletal: no clubbing / cyanosis. No joint deformity upper and lower extremities. Good ROM, no contractures. Normal muscle tone.  Skin: no rashes, lesions, ulcers. No induration Neurologic: CN 2-12 grossly intact. Sensation intact, DTR normal. Strength 4+/5 in all 4.  Psychiatric: Normal judgment and insight. Alert and oriented x 3. Normal mood.    Data Reviewed:   I have personally reviewed following labs and imaging studies:  Labs: Labs show the following:   Basic Metabolic Panel: Recent Labs  Lab 01/14/19 2010 18-Jan-2019 0505  NA 135 139  K 4.7 4.0  CL 100 103  CO2 20* 24  GLUCOSE 129* 99  BUN 150* 142*  CREATININE 4.91* 4.07*  CALCIUM 8.9 8.4*   GFR Estimated Creatinine Clearance: 6.8 mL/min (A) (by C-G formula based on SCr of 4.07 mg/dL (H)). Liver Function Tests: Recent Labs  Lab 01/14/19 2010 2019-01-18 0505  AST 16 13*  ALT 9 9  ALKPHOS 43 37*  BILITOT 0.6 0.6  PROT 6.8 5.9*  ALBUMIN 3.7 3.1*   Recent Labs  Lab 01/14/19 2010  LIPASE 34   No results for input(s): AMMONIA in the last 168 hours. Coagulation profile Recent Labs  Lab 01/14/19 2010  INR 1.1    CBC: Recent Labs  Lab 01/14/19 2010 01-18-19 0505  WBC 4.2 4.2  NEUTROABS 3.0 2.4  HGB 7.4* 6.5*  HCT 24.0* 19.9*  MCV 89.6 87.7  PLT 215 199   Cardiac Enzymes: Recent Labs  Lab 01/14/19 2330 01-18-19 0730  CKTOTAL  --  102  TROPONINI 0.05* 0.07*   BNP (last 3 results) No results for input(s): PROBNP in the last 8760 hours. CBG: No results for input(s): GLUCAP in the last 168 hours. D-Dimer: No results for input(s): DDIMER in the last 72 hours. Hgb A1c: No results for input(s): HGBA1C in the last 72 hours. Lipid Profile: No results for input(s): CHOL,  HDL, LDLCALC, TRIG, CHOLHDL, LDLDIRECT in the last 72 hours. Thyroid function studies: Recent Labs    Jan 18, 2019 0505  TSH 1.918   Anemia work up: Recent Labs    01/14/19 2330  VITAMINB12 2,362*  FOLATE 8.8  FERRITIN 15  TIBC 365  IRON 43  RETICCTPCT 1.3   Sepsis Labs: Recent Labs  Lab 01/14/19 2010 01/18/2019 0505  WBC 4.2 4.2    Microbiology No results found for this or any previous visit (from the past 240 hour(s)).  Procedures and diagnostic studies:  US Renal  Result Date: 2019-01-18 CLINICAL DATA:  85 y/o  F; acute renal failure. EXAM: RENAL / URINARY TRACT ULTRASOUND COMPLETE COMPARISON:  04/09/2013 CT abdomen and pelvis FINDINGS: Right Kidney: Renal measurements: 10.1 x 4.3 x 4.1 cm = volume: 95.1 mL. Echogenicity within normal limits. No mass visualized. Mild right hydronephrosis. Left Kidney: Renal measurements: 9.1 x 5.1 x 4.7 cm = volume: 117.0 mL. Echogenicity within normal limits. No mass or hydronephrosis visualized. Bladder: Appears normal for degree of bladder distention.  IMPRESSION: Mild right hydronephrosis. Electronically Signed   By: Kristine Garbe M.D.   On: 01/15/2019 02:52   Dg Chest Port 1 View  Result Date: 01/14/2019 CLINICAL DATA:  Generalized weakness for 1 month EXAM: PORTABLE CHEST 1 VIEW COMPARISON:  02/07/2016 FINDINGS: Cardiac shadows within normal limits. Aortic calcifications are again noted. Calcified granuloma in the left lung is noted. No focal infiltrate or sizable effusion is seen. No bony abnormality is noted. IMPRESSION: No acute abnormality seen. Electronically Signed   By: Inez Catalina M.D.   On: 01/14/2019 20:42    Medications:   . amLODipine  10 mg Oral Daily  . atorvastatin  20 mg Oral q1800  . dorzolamide-timolol  1 drop Both Eyes BID  . ferrous sulfate  325 mg Oral Daily  . latanoprost  1 drop Both Eyes QHS  . levothyroxine  88 mcg Oral Q0600  . mirtazapine  7.5 mg Oral QHS  . Netarsudil Dimesylate  1 drop Both  Eyes QHS  . pantoprazole (PROTONIX) IV  40 mg Intravenous Q12H   Continuous Infusions: . sodium chloride 100 mL/hr at 01/15/19 0039     LOS: 0 days   Rondell A Smith  [facility]   *Please refer to [email or web address], password [email or web address] to get updated schedule on who will round on this patient, as hospitalists switch teams weekly. If 7PM-7AM, please contact night-coverage at [email or web address], password [email or web address] for any overnight needs.

## 2019-01-15 NOTE — Progress Notes (Signed)
Patient was educated about the importance of calling before getting out of bed due to her weakness. Bed alarm is on with patient's agreement just to remind her not to get up by herself.

## 2019-01-15 NOTE — Evaluation (Signed)
Physical Therapy Evaluation Patient Details Name: Gina [name] MRN: [id number] DOB: 1929/02/06 Today's Date: 01/15/2019   History of Present Illness  Patient is a 85 y/o female who presents with 2 weeks of weakness. Hgb 6.5. Found to have ARF, s/p transfusion. PMH includes anemia, PVD, TB,. HTN, HLD, DM, deperssion, CHF.   Clinical Impression  Patient presents with generalized weakness, impaired balance, impaired mobility and decreased safety awareness s/p above. Pt lives alone and reports being Mod I PTA using SPC for mobility. Has an aide 7 days/week to assist with bathing. Reports multiple falls at home; wears a life alert bracelet. Today, pt requires Min A for standing transfers and for ambulation due to unsteadiness and a few LOB using SPC. Encouraged use of RW to improve balance and decrease fall risk however pt adamant about using SPC. Would really benefit from more support at home. Will follow acutely to maximize independence and mobility prior to return home.     Follow Up Recommendations Home health PT;Supervision for mobility/OOB    Equipment Recommendations  None recommended by PT    Recommendations for Other Services       Precautions / Restrictions Precautions Precautions: Fall Restrictions Weight Bearing Restrictions: No      Mobility  Bed Mobility Overal bed mobility: Needs Assistance Bed Mobility: Rolling;Sidelying to Sit Rolling: Min guard Sidelying to sit: Min guard;HOB elevated       General bed mobility comments: Increased effort to get to EOB but no assist needed, use of rail.   Transfers Overall transfer level: Needs assistance Equipment used: Straight cane Transfers: Sit to/from Stand Sit to Stand: Min assist         General transfer comment: ASsist to power to standing with cues for hand placement/technique. Transferred to chair post ambulation.  Ambulation/Gait Ambulation/Gait assistance: Min assist Gait Distance (Feet): 120  Feet Assistive device: Straight cane Gait Pattern/deviations: Step-to pattern;Step-through pattern;Narrow base of support;Staggering right;Decreased stride length Gait velocity: decreased Gait velocity interpretation: <1.31 ft/sec, indicative of household ambulator General Gait Details: Slow, shuffling like steps with LOB x3 requiring Min A; cues for [facility] sequencing as pt tends to leave it behind at times. 2/4 DOE. HR 70-80s bpm.  Stairs            Wheelchair Mobility    Modified Rankin (Stroke Patients Only)       Balance Overall balance assessment: Needs assistance;History of Falls Sitting-balance support: Feet supported;No upper extremity supported Sitting balance-Leahy Scale: Good Sitting balance - Comments: Able to reach outside BoS and donn shoes without difficulty or LOB.   Standing balance support: During functional activity Standing balance-Leahy Scale: Poor Standing balance comment: Requires UE support for static standing and external support for dynamic standing and ambulation                             Pertinent Vitals/Pain Pain Assessment: No/denies pain    Home Living Family/patient expects to be discharged to:: Private residence Living Arrangements: Alone Available Help at Discharge: Other (Comment)(HHA everyday) Type of Home: Apartment Home Access: Level entry     Home Layout: One level Home Equipment: Walker - 2 wheels;Cane - single point;Grab bars - tub/shower;Shower seat      Prior Function Level of Independence: Needs assistance   Gait / Transfers Assistance Needed: Uses SPC for ambulation. Does her own dressing. Assist with bathing from an aide. Reports multiple falls at home.  Hand Dominance   Dominant Hand: Right    Extremity/Trunk Assessment   Upper Extremity Assessment Upper Extremity Assessment: Defer to OT evaluation    Lower Extremity Assessment Lower Extremity Assessment: Generalized weakness     Cervical / Trunk Assessment Cervical / Trunk Assessment: Kyphotic  Communication   Communication: No difficulties  Cognition Arousal/Alertness: Awake/alert Behavior During Therapy: WFL for tasks assessed/performed Overall Cognitive Status: No family/caregiver present to determine baseline cognitive functioning                                 General Comments: thinks it is "march." Knows it is "2020." Tangential in speech. Poor awareness of safety; uses SPC instead of walker knowing that she has had lots of falls.       General Comments General comments (skin integrity, edema, etc.): VSS.     Exercises     Assessment/Plan    PT Assessment Patient needs continued PT services  PT Problem List Decreased strength;Decreased balance;Decreased cognition;Decreased knowledge of use of DME;Decreased mobility;Decreased safety awareness       PT Treatment Interventions Functional mobility training;Balance training;Patient/family education;Gait training;Therapeutic activities;Therapeutic exercise;DME instruction    PT Goals (Current goals can be found in the Care Plan section)  Acute Rehab PT Goals Patient Stated Goal: to get stronger and get home PT Goal Formulation: With patient Time For Goal Achievement: 01/29/19 Potential to Achieve Goals: Good    Frequency Min 3X/week   Barriers to discharge Decreased caregiver support lives alone    Co-evaluation               AM-PAC PT "6 Clicks" Mobility  Outcome Measure Help needed turning from your back to your side while in a flat bed without using bedrails?: A Little Help needed moving from lying on your back to sitting on the side of a flat bed without using bedrails?: A Little Help needed moving to and from a bed to a chair (including a wheelchair)?: A Little Help needed standing up from a chair using your arms (e.g., wheelchair or bedside chair)?: A Little Help needed to walk in hospital room?: A Little Help  needed climbing 3-5 steps with a railing? : A Little 6 Click Score: 18    End of Session Equipment Utilized During Treatment: Gait belt Activity Tolerance: Patient tolerated treatment well Patient left: in chair;with call bell/phone within reach;with chair alarm set Nurse Communication: Mobility status PT Visit Diagnosis: Difficulty in walking, not elsewhere classified (R26.2);Unsteadiness on feet (R26.81);Muscle weakness (generalized) (M62.81);Repeated falls (R29.6)    Time: 2841-3244 PT Time Calculation (min) (ACUTE ONLY): 24 min   Charges:   PT Evaluation $PT Eval Low Complexity: 1 Low PT Treatments $Gait Training: 8-22 mins        Wray Kearns, [place], DPT Acute Rehabilitation Services Pager [phone] Office [place] 01/15/2019, 3:21 PM

## 2019-01-16 ENCOUNTER — Other Ambulatory Visit: Payer: Self-pay

## 2019-01-16 DIAGNOSIS — E119 Type 2 diabetes mellitus without complications: Secondary | ICD-10-CM

## 2019-01-16 DIAGNOSIS — R7989 Other specified abnormal findings of blood chemistry: Secondary | ICD-10-CM

## 2019-01-16 LAB — TYPE AND SCREEN
ABO/RH(D): O POS
Antibody Screen: NEGATIVE
Unit division: 0

## 2019-01-16 LAB — CBC WITH DIFFERENTIAL/PLATELET
Abs Immature Granulocytes: 0.02 10*3/uL (ref 0.00–0.07)
Basophils Absolute: 0 10*3/uL (ref 0.0–0.1)
Basophils Relative: 1 %
Eosinophils Absolute: 0.1 10*3/uL (ref 0.0–0.5)
Eosinophils Relative: 2 %
HCT: 29.3 % — ABNORMAL LOW (ref 36.0–46.0)
Hemoglobin: 9.2 g/dL — ABNORMAL LOW (ref 12.0–15.0)
Immature Granulocytes: 0 %
Lymphocytes Relative: 21 %
Lymphs Abs: 1.1 10*3/uL (ref 0.7–4.0)
MCH: 28 pg (ref 26.0–34.0)
MCHC: 31.4 g/dL (ref 30.0–36.0)
MCV: 89.1 fL (ref 80.0–100.0)
Monocytes Absolute: 0.5 10*3/uL (ref 0.1–1.0)
Monocytes Relative: 9 %
Neutro Abs: 3.5 10*3/uL (ref 1.7–7.7)
Neutrophils Relative %: 67 %
Platelets: 159 10*3/uL (ref 150–400)
RBC: 3.29 MIL/uL — ABNORMAL LOW (ref 3.87–5.11)
RDW: 15.4 % (ref 11.5–15.5)
WBC: 5.3 10*3/uL (ref 4.0–10.5)
nRBC: 0 % (ref 0.0–0.2)

## 2019-01-16 LAB — BASIC METABOLIC PANEL
Anion gap: 14 (ref 5–15)
BUN: 119 mg/dL — ABNORMAL HIGH (ref 8–23)
CO2: 19 mmol/L — ABNORMAL LOW (ref 22–32)
Calcium: 8.9 mg/dL (ref 8.9–10.3)
Chloride: 108 mmol/L (ref 98–111)
Creatinine, Ser: 2.69 mg/dL — ABNORMAL HIGH (ref 0.44–1.00)
GFR calc Af Amer: 18 mL/min — ABNORMAL LOW (ref >60)
GFR calc non Af Amer: 15 mL/min — ABNORMAL LOW (ref >60)
Glucose, Bld: 91 mg/dL (ref 70–99)
Potassium: 4.4 mmol/L (ref 3.5–5.1)
Sodium: 141 mmol/L (ref 135–145)

## 2019-01-16 LAB — BPAM RBC
Blood Product Expiration Date: 202004302359
ISSUE DATE / TIME: 202004170820
Unit Type and Rh: 5100

## 2019-01-16 LAB — HEMOGLOBIN AND HEMATOCRIT, BLOOD
HCT: 30.5 % — ABNORMAL LOW (ref 36.0–46.0)
Hemoglobin: 9.9 g/dL — ABNORMAL LOW (ref 12.0–15.0)

## 2019-01-16 MED ORDER — SENNA 8.6 MG PO TABS
1.0000 | ORAL_TABLET | Freq: Every day | ORAL | Status: DC
  Administered 2019-01-16 – 2019-01-17 (×2): 8.6 mg via ORAL
  Filled 2019-01-16 (×2): qty 1

## 2019-01-16 MED ORDER — BRIMONIDINE TARTRATE 0.2 % OP SOLN
1.0000 [drp] | Freq: Two times a day (BID) | OPHTHALMIC | Status: DC
  Administered 2019-01-16 – 2019-01-17 (×2): 1 [drp] via OPHTHALMIC
  Filled 2019-01-16: qty 5

## 2019-01-16 MED ORDER — SODIUM CHLORIDE 0.9 % IV SOLN
INTRAVENOUS | Status: DC
  Administered 2019-01-16: 22:00:00 via INTRAVENOUS
  Administered 2019-01-16: 1 mL via INTRAVENOUS
  Administered 2019-01-17: 100 mL via INTRAVENOUS

## 2019-01-16 MED ORDER — BISACODYL 5 MG PO TBEC: 5.0000 mg | DELAYED_RELEASE_TABLET | Freq: Every day | ORAL | Status: DC | PRN

## 2019-01-16 MED ORDER — PANTOPRAZOLE SODIUM 40 MG PO TBEC
40.0000 mg | DELAYED_RELEASE_TABLET | Freq: Every day | ORAL | Status: DC
  Administered 2019-01-17: 40 mg via ORAL
  Filled 2019-01-16: qty 1

## 2019-01-16 NOTE — Progress Notes (Signed)
Notified MD via amion text page that pt's BP was 126/37 pt asymptomatic. PT A&O x4, sitting up in bed. Awaiting response

## 2019-01-16 NOTE — Progress Notes (Signed)
Progress Note    Gina [name]  WJX:[id number] DOB: [birth year]  DOA: 01/14/2019 PCP: Loletta Specter, PA-C    Brief Narrative:   Chief complaint: Weakness  Medical records reviewed and are as summarized below:  Gina [name] is an 85 y.o. female with past medical history significant for HTN, CKD stage III, PVD, post ablative hypothyroidism, and chronic anemia requiring transfusions; who presented with generalized weakness.  Patient reporting dark stools, but stool guaiac negative.  Creatinine worsened from 1.5 to 4.9.   Assessment/Plan:   Principal Problem:   ARF (acute renal failure) (HCC) Active Problems:   Essential hypertension   Diabetes mellitus, type 2 (HCC)   Chronic diastolic congestive heart failure (HCC)   Normochromic normocytic anemia   Elevated troponin  Acute renal failure superimposed on chronic kidney disease stage III, mild right hydronephrosis: Resolving.  Patient presents with worsening creatinine from 1.5 in January 2020 to  4.91 on admission with BUN 150.  Patient was started on IV fluids of 100 mL/h.  CK 107.  Urinalysis showed no significant signs of infection.  Renal ultrasound noted some mild right hydronephrosis, but patient denies any difficulty voiding or flank pain.  Creatinine improving slowly 4.07-> 2.69.  Suspect prerenal cause of symptoms given significantly elevated BUN to creatinine ratio patient also on diuretics. -Continue normal saline IV fluids and 100 mL/h -Strict ins and outs -Avoid nephrotoxic agents -Bladder scan every shift with orders to place Foley catheter if urinary retention appreciated post void -Recheck BMP tomorrow  Anemia of chronic disease: Hemoglobin 7.4 on admission down to 6.5 today.  Patient ordered to be transfused 1 unit of packed red blood cells.  Elevated BUN could give concern for upper GI bleed, but stool guaiacs were noted to be negative.  Repeat hemoglobin following blood transfusion noted to be 9.9-> 9.2  g/dL. -Continue to monitor   Elevated troponin: Troponin 0.05 ->0.07->0.05-> 0.05. No complaints of chest pain at this time. -Continue to monitor  Chronic diastolic congestive heart failure: Last echocardiogram revealed EF noted to be 55 to 60% grade 2 diastolic dysfunction in 11/2014.  Patient appears to be still hypovolemic at this time.  Currently breathing in no acute distress on room air.  No signs of fluid overload. -Continue to monitor  Essential hypertension: Blood pressures currently at the lower end of normal, but patient currently asymptomatic. -Holding ARB and hydrochlorothiazide  Generalized weakness: Likely secondary to dehydration.  Physical therapy evaluated patient and recommending home health PT with no equipment needed. -Continue physical therapy   Diabetes mellitus type 2: Currently not on any medications for treatment.  Last hemoglobin A1c 6.2 on 10/12/2018. -Continue to monitor  Peptic ulcer disease history -Change Protonix to 40 mg p.o. daily  Peripheral vascular disease -Continue statin   Body mass index is 15.57 kg/m.   Family Communication/Anticipated D/C date and plan/Code Status   DVT prophylaxis: SCDs Code Status: Full Code.  Family Communication: No family present at bedside Disposition Plan: Discharge home in 2 to 3 days once kidney function improves.   Medical Consultants:    None.   Anti-Infectives:    None  Subjective:   Patient reports no complaints of shortness of breath or problems overnight.  Nursing report fluids were discontinued last night around 2200 as it was scheduled to be continued for 1 day. Objective:    Vitals:   01/15/19 2122 01/16/19 0511 01/16/19 0804 01/16/19 1059  BP: (!) 141/49 (!) 124/56 (!) 144/51 (!) 126/37  Pulse:  (!) 59 61 (!) 48  Resp: 20 20 16 17   Temp: 97.8 F (36.6 C) 98.9 F (37.2 C) 98.1 F (36.7 C) 98.1 F (36.7 C)  TempSrc: Oral Oral Oral Oral  SpO2: 100% 97% 98% 96%  Weight:  45.1 kg     Height:        Intake/Output Summary (Last 24 hours) at 01/16/2019 1301 Last data filed at 01/16/2019 0913 Gross per 24 hour  Intake 240 ml  Output 1500 ml  Net -1260 ml   Filed Weights   01/15/19 0013 01/16/19 0511  Weight: 44.2 kg 45.1 kg    Exam: Constitutional: Frail elderly female in NAD, calm, comfortable Eyes: PERRL, lids and conjunctivae normal ENMT: Mucous membranes are moist. Posterior pharynx clear of any exudate or lesions.  Neck: normal, supple, no masses, no thyromegaly Respiratory: clear to auscultation bilaterally, no wheezing, no crackles. Normal respiratory effort. No accessory muscle use.  Cardiovascular: Regular rate and rhythm, no murmurs / rubs / gallops. No extremity edema. 2+ pedal pulses. No carotid bruits.  Abdomen: no tenderness, no masses palpated. No hepatosplenomegaly. Bowel sounds positive.  Musculoskeletal: no clubbing / cyanosis. No joint deformity upper and lower extremities. Good ROM, no contractures. Normal muscle tone.  Skin: no rashes, lesions, ulcers. No induration Neurologic: CN 2-12 grossly intact.Strength 5/5 in all 4.  Psychiatric: Normal judgment and insight. Alert and oriented x 3. Normal mood.    Data Reviewed:   I have personally reviewed following labs and imaging studies:  Labs: Labs show the following:   Basic Metabolic Panel: Recent Labs  Lab 01/14/19 2010 01/15/19 0505 01/16/19 0639  NA 135 139 141  K 4.7 4.0 4.4  CL 100 103 108  CO2 20* 24 19*  GLUCOSE 129* 99 91  BUN 150* 142* 119*  CREATININE 4.91* 4.07* 2.69*  CALCIUM 8.9 8.4* 8.9   GFR Estimated Creatinine Clearance: 10.5 mL/min (A) (by C-G formula based on SCr of 2.69 mg/dL (H)). Liver Function Tests: Recent Labs  Lab 01/14/19 2010 01/15/19 0505  AST 16 13*  ALT 9 9  ALKPHOS 43 37*  BILITOT 0.6 0.6  PROT 6.8 5.9*  ALBUMIN 3.7 3.1*   Recent Labs  Lab 01/14/19 2010  LIPASE 34   No results for input(s): AMMONIA in the last 168  hours. Coagulation profile Recent Labs  Lab 01/14/19 2010  INR 1.1    CBC: Recent Labs  Lab 01/14/19 2010 01/15/19 0505 01/15/19 1630 01/16/19 0639  WBC 4.2 4.2  --  5.3  NEUTROABS 3.0 2.4  --  3.5  HGB 7.4* 6.5* 9.9* 9.2*  HCT 24.0* 19.9* 30.5* 29.3*  MCV 89.6 87.7  --  89.1  PLT 215 199  --  159   Cardiac Enzymes: Recent Labs  Lab 01/14/19 2330 01/15/19 0730 01/15/19 1204 01/15/19 1751  CKTOTAL  --  102  --   --   TROPONINI 0.05* 0.07* 0.05* 0.05*   BNP (last 3 results) No results for input(s): PROBNP in the last 8760 hours. CBG: No results for input(s): GLUCAP in the last 168 hours. D-Dimer: No results for input(s): DDIMER in the last 72 hours. Hgb A1c: No results for input(s): HGBA1C in the last 72 hours. Lipid Profile: No results for input(s): CHOL, HDL, LDLCALC, TRIG, CHOLHDL, LDLDIRECT in the last 72 hours. Thyroid function studies: Recent Labs    01/15/19 0505  TSH 1.918   Anemia work up: Recent Labs    01/14/19 2330  VITAMINB12 2,362*  FOLATE 8.8  FERRITIN 15  TIBC 365  IRON 43  RETICCTPCT 1.3   Sepsis Labs: Recent Labs  Lab 01/14/19 2010 2019-01-23 0505 01/16/19 0639  WBC 4.2 4.2 5.3    Microbiology No results found for this or any previous visit (from the past 240 hour(s)).  Procedures and diagnostic studies:  US Renal  Result Date: 2019/01/23 CLINICAL DATA:  86 y/o  F; acute renal failure. EXAM: RENAL / URINARY TRACT ULTRASOUND COMPLETE COMPARISON:  04/09/2013 CT abdomen and pelvis FINDINGS: Right Kidney: Renal measurements: 10.1 x 4.3 x 4.1 cm = volume: 95.1 mL. Echogenicity within normal limits. No mass visualized. Mild right hydronephrosis. Left Kidney: Renal measurements: 9.1 x 5.1 x 4.7 cm = volume: 117.0 mL. Echogenicity within normal limits. No mass or hydronephrosis visualized. Bladder: Appears normal for degree of bladder distention. IMPRESSION: Mild right hydronephrosis. Electronically Signed   By: Mitzi Hansen  M.D.   On: January 23, 2019 02:52   Dg Chest Port 1 View  Result Date: 01/14/2019 CLINICAL DATA:  Generalized weakness for 1 month EXAM: PORTABLE CHEST 1 VIEW COMPARISON:  02/07/2016 FINDINGS: Cardiac shadows within normal limits. Aortic calcifications are again noted. Calcified granuloma in the left lung is noted. No focal infiltrate or sizable effusion is seen. No bony abnormality is noted. IMPRESSION: No acute abnormality seen. Electronically Signed   By: Alcide Clever M.D.   On: 01/14/2019 20:42    Medications:   . amLODipine  10 mg Oral Daily  . atorvastatin  20 mg Oral q1800  . brimonidine  1 drop Both Eyes BID  . dorzolamide-timolol  1 drop Both Eyes BID  . feeding supplement (ENSURE ENLIVE)  237 mL Oral BID BM  . ferrous sulfate  325 mg Oral Daily  . latanoprost  1 drop Both Eyes QHS  . levothyroxine  88 mcg Oral Q0600  . mirtazapine  7.5 mg Oral QHS  . multivitamin with minerals  1 tablet Oral Daily  . Netarsudil Dimesylate  1 drop Both Eyes QHS  . pantoprazole  40 mg Oral BID   Continuous Infusions: . sodium chloride 1 mL (01/16/19 0929)     LOS: 1 day   Lindzy Rupert A Sarah Baez  [facility]   *Please refer to [email or web address], password [email or web address] to get updated schedule on who will round on this patient, as hospitalists switch teams weekly. If 7PM-7AM, please contact night-coverage at [email or web address], password [email or web address] for any overnight needs.

## 2019-01-16 NOTE — Plan of Care (Signed)
  Problem: Education: Goal: Knowledge of General Education information will improve Description Including pain rating scale, medication(s)/side effects and non-pharmacologic comfort measures Outcome: Progressing   Problem: Health Behavior/Discharge Planning: Goal: Ability to manage health-related needs will improve Outcome: Progressing   

## 2019-01-16 NOTE — Progress Notes (Signed)
PT Cancellation Note  Patient Details Name: Gina [name] MRN: [id number] DOB: Nov 19, 1928   Cancelled Treatment:     Therapy attempted to see the patient on 2 occasions. On the first the patient reported it was too early. On the second occasion the patient reported she was taking a nap. On both occassions the therapist attempted to educate the patient on the importance of mobility, and the importance of getting out of bed. She adamantly refused. PT will follow up as able.    Carney Living PT DPT  01/16/2019, 2:53 PM

## 2019-01-16 NOTE — Progress Notes (Signed)
Requested pt to get up to chair. Pt refused. Pt also previously refused to work with PT/OT today, 01/16/2019.

## 2019-01-16 NOTE — Progress Notes (Signed)
Pt stated she normally uses "purple eye drops" in the morning as well as her "blue eye drops". Pt educated there are only 1 eye drop ordered this AM. Pt states to this RN "You don't know anything. 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 Pt educated that this RN cannot give something that is not ordered. Pt asked to see the "head nurse". CN notified and spoke with pt and reiterated that RN cannot dispense medication that is not ordered and that the RN will notify MD. Pt stated that "I want to talk to the doctor anyway".  This RN text paged MD via [place] regarding pt's request to speak with MD about her eye drops.

## 2019-01-17 DIAGNOSIS — E1122 Type 2 diabetes mellitus with diabetic chronic kidney disease: Secondary | ICD-10-CM

## 2019-01-17 LAB — BASIC METABOLIC PANEL
Anion gap: 9 (ref 5–15)
BUN: 90 mg/dL — ABNORMAL HIGH (ref 8–23)
CO2: 21 mmol/L — ABNORMAL LOW (ref 22–32)
Calcium: 8.4 mg/dL — ABNORMAL LOW (ref 8.9–10.3)
Chloride: 113 mmol/L — ABNORMAL HIGH (ref 98–111)
Creatinine, Ser: 1.93 mg/dL — ABNORMAL HIGH (ref 0.44–1.00)
GFR calc Af Amer: 26 mL/min — ABNORMAL LOW (ref >60)
GFR calc non Af Amer: 23 mL/min — ABNORMAL LOW (ref >60)
Glucose, Bld: 104 mg/dL — ABNORMAL HIGH (ref 70–99)
Potassium: 3.8 mmol/L (ref 3.5–5.1)
Sodium: 143 mmol/L (ref 135–145)

## 2019-01-17 LAB — CBC WITH DIFFERENTIAL/PLATELET
Abs Immature Granulocytes: 0.01 10*3/uL (ref 0.00–0.07)
Basophils Absolute: 0 10*3/uL (ref 0.0–0.1)
Basophils Relative: 0 %
Eosinophils Absolute: 0.1 10*3/uL (ref 0.0–0.5)
Eosinophils Relative: 2 %
HCT: 27.6 % — ABNORMAL LOW (ref 36.0–46.0)
Hemoglobin: 8.6 g/dL — ABNORMAL LOW (ref 12.0–15.0)
Immature Granulocytes: 0 %
Lymphocytes Relative: 18 %
Lymphs Abs: 0.9 10*3/uL (ref 0.7–4.0)
MCH: 27.7 pg (ref 26.0–34.0)
MCHC: 31.2 g/dL (ref 30.0–36.0)
MCV: 89 fL (ref 80.0–100.0)
Monocytes Absolute: 0.4 10*3/uL (ref 0.1–1.0)
Monocytes Relative: 8 %
Neutro Abs: 3.6 10*3/uL (ref 1.7–7.7)
Neutrophils Relative %: 72 %
Platelets: 179 10*3/uL (ref 150–400)
RBC: 3.1 MIL/uL — ABNORMAL LOW (ref 3.87–5.11)
RDW: 15.3 % (ref 11.5–15.5)
WBC: 5 10*3/uL (ref 4.0–10.5)
nRBC: 0 % (ref 0.0–0.2)

## 2019-01-17 MED ORDER — SENNA 8.6 MG PO TABS: 1.0000 | ORAL_TABLET | Freq: Every day | ORAL | 0 refills | Status: DC

## 2019-01-17 MED ORDER — ENSURE ENLIVE PO LIQD: 237.0000 mL | Freq: Two times a day (BID) | ORAL | 12 refills | Status: DC

## 2019-01-17 MED ORDER — HYDRALAZINE HCL 25 MG PO TABS: 25.0000 mg | ORAL_TABLET | Freq: Three times a day (TID) | ORAL | 0 refills | Status: DC

## 2019-01-17 NOTE — Discharge Summary (Signed)
Physician Discharge Summary  SAPHYRA HUTT JSE:[id number] DOB: 1929/05/23 DOA: 01/14/2019  PCP: Clent Demark, PA-C  Admit date: 01/14/2019 Discharge date: 01/17/2019  Admitted From: Home Disposition: Home  Recommendations for Outpatient Follow-up:  1. Follow up with PCP in 1-2 weeks 2. Please obtain BMP/CBC in one week 3. Please follow up on the following pending results:  Home Health: PT/OT/RN/aide Equipment/Devices: None  Discharge Condition: Stable CODE STATUS: Full code  HPI: Per Dr. Hal Hope 85 y.o. female with history HTN, CKD 3, PVD, post ablative hypothyroidism, anemia of chronic disease, FTT, dCHF and PUD presenting with 2 weeks of weakness and dark emesis 2 weeks prior to admission.  In the ER on exam patient appears dehydrated.  Vitals not impressive.  Labs show creatinine of 4.9 (baseline about 1.5 in 1/20). LFTs were normal.  CBC showed hemoglobin of 7.4 (baseline 8-9).  Troponin 0 0.05.  EKG NSR with LVH.  UA, CXR and FOBT negative.  Started on IV fluid.  Renal ultrasound ordered and admitted for AKI and weakness.  Hospital Course: In the hospital, continued on IV fluids with improvement in her renal function.  Nephrotoxic meds discontinued.  Hemoglobin dropped to 6.5.  Received 1 unit of blood and posttransfusion Hgb 9.9, will more than expected but reached equilibrium at 8.6 on day of discharge.  No signs of bleeding.  FOBT negative.  Evaluated by nutrition for FTT/protein calorie malnutrition and started on supplement.  Evaluated by PT/OT prior to discharge and [facility] PT/OT/RN/aide was ordered on discharge as patient lives alone.   See individual problem list below for more.  Attempted to call patient's niece at patient's request for update before discharge but no answer.   Discharge Diagnoses:  Principal Problem:   ARF (acute renal failure) (HCC) Active Problems:   Essential hypertension   Diabetes mellitus, type 2 (HCC)   Chronic diastolic congestive  heart failure (HCC)   Normochromic normocytic anemia   Elevated troponin  Acute renal failure superimposed on chronic kidney disease stage III, mild right hydronephrosis:  Likely prerenal but she is also on diuretics and ARBs. Hydrated with IV fluids.  Creatinine trended from 4.91 on admission to 1.93 on the day of discharge.  Baseline about 1.5.  CK 107.  Urinalysis showed no significant signs of infection.  Renal ultrasound noted some mild right hydronephrosis, but patient denies any difficulty voiding or flank pain.  -Losartan, Lasix and HCTZ discontinued.  -Discharged on amlodipine for blood pressure. -May re-introduce Lasix and losartan as appropriate at follow-up.  Anemia of chronic disease: Hemoglobin 7.4 on admission and dropped to 6.5.  Received 1 unit of blood and Hgb up to 9.9 which is more than expected.  Hgb 8.6 on the day of discharge which is a reasonable equilibrium after 1 unit.  FOBT negative.    Elevated troponin: troponin 0.05 ->0.07->0.05-> 0.05. No complaints of chest pain at this time.  Likely due to slow renal clearance.  Chronic diastolic CHF: Last echocardiogram revealed EF noted to be 55 to 07% grade 2 diastolic dysfunction in 11/7104.  Stable off diuretics during this admission. -May reinitiate Lasix at follow-up.  Essential hypertension: Normotensive off multiple blood pressure medications which is concerning for noncompliance. -Discharged on amlodipine.  Losartan, Lasix and HCTZ discontinued on discharge.  Diabetes mellitus type 2:  Last HgbA1c  6.2% on 10/12/2018.  Peptic ulcer disease history -Discharged on home medications.  Peripheral vascular disease -Continue statin  Severe protein calorie malnutrition: BMI 15.5.  Significant muscle mass  wasting.  Likely due to chronic disease.  -Evaluated by nutritionist -Discharged on supplement as below.  At risk for polypharmacy: patient lives alone.  Some redundance on her medication list such as Lasix and  HCTZ which could have contributed to her renal failure.  -Would benefit from [facility] RN.  Generalized weakness/debility: Evaluated by PT and OT. -Appropriately discharge with home health PT/OT/RN/aide.  Patient lives alone. Discharge Instructions  Discharge Instructions    Call MD for:  extreme fatigue   Complete by:  As directed    Call MD for:  persistant dizziness or light-headedness   Complete by:  As directed    Call MD for:  persistant nausea and vomiting   Complete by:  As directed    Call MD for:  severe uncontrolled pain   Complete by:  As directed    Diet - low sodium heart healthy   Complete by:  As directed    Discharge instructions   Complete by:  As directed    It has been a pleasure taking care of you! You were admitted with generalized weakness and kidney failure.  The weakness could be due to anemia (low blood level) and kidney failure as well.  The kidney failure could be due to dehydration and the blood pressure medications you have been taking.  Your kidney numbers have improved after we stop some of your blood pressure medications.  Your blood level has also improved after blood transfusion.  At this point, we think it is safe to let you go home and follow-up with your primary care doctor next week.   We have made some adjustments to your medications.  Please read the directions before you take them.  Once you are discharged, your primary care physician will handle any further medical issues. Please note that NO REFILLS for any discharge medications will be authorized once you are discharged, as it is imperative that you return to your primary care physician (or establish a relationship with a primary care physician if you do not have one) for your aftercare needs so that they can reassess your need for medications and monitor your lab values. Take care,   Increase activity slowly   Complete by:  As directed      Allergies as of 01/17/2019   No Known Allergies      Medication List    STOP taking these medications   furosemide 40 MG tablet Commonly known as:  LASIX   hydrochlorothiazide 25 MG tablet Commonly known as:  HYDRODIURIL   losartan 50 MG tablet Commonly known as:  COZAAR     TAKE these medications   amLODipine 10 MG tablet Commonly known as:  NORVASC TAKE ONE TABLET BY MOUTH EVERY DAY   atorvastatin 20 MG tablet Commonly known as:  LIPITOR Take 1 tablet (20 mg total) by mouth daily at 6 PM.   brimonidine 0.2 % ophthalmic solution Commonly known as:  ALPHAGAN Place 1 drop into both eyes 2 (two) times a day.   calcium carbonate 1250 (500 Ca) MG tablet Commonly known as:  OS-CAL - dosed in mg of elemental calcium Take 1 tablet by mouth daily with breakfast.   dorzolamide-timolol 22.3-6.8 MG/ML ophthalmic solution Commonly known as:  COSOPT Place 1 drop into both eyes 2 (two) times daily.   feeding supplement (ENSURE ENLIVE) Liqd Take 237 mLs by mouth 2 (two) times daily between meals.   ferrous sulfate 325 (65 FE) MG EC tablet Take 1 tablet (325 mg total)  by mouth daily.   hydrALAZINE 25 MG tablet Commonly known as:  APRESOLINE Take 1 tablet (25 mg total) by mouth 3 (three) times daily for 30 days.   latanoprost 0.005 % ophthalmic solution Commonly known as:  XALATAN Place 1 drop into both eyes at bedtime.   levothyroxine 88 MCG tablet Commonly known as:  SYNTHROID Take 1 tablet (88 mcg total) by mouth daily.   linaclotide 145 MCG Caps capsule Commonly known as:  Linzess Take 1 capsule (145 mcg total) by mouth daily before breakfast.   mirtazapine 15 MG tablet Commonly known as:  REMERON Take 7.5 mg by mouth at bedtime.   multivitamin with minerals Tabs tablet Take 1 tablet by mouth daily.   Rhopressa 0.02 % Soln Generic drug:  Netarsudil Dimesylate Place 1 drop into both eyes at bedtime.   senna 8.6 MG Tabs tablet Commonly known as:  SENOKOT Take 1 tablet (8.6 mg total) by mouth daily. Start taking  on:  January 18, 2019   Vitamin D3 10 MCG (400 UNIT) tablet Take 1 tablet (400 Units total) by mouth daily.            Durable Medical Equipment  (From admission, onward)         Start     Ordered   01/17/19 1255  For home use only DME 3 n 1  Once     01/17/19 1255         Follow-up Information    Clent Demark, PA-C. Schedule an appointment as soon as possible for a visit in 1 week(s).   Specialty:  Physician Assistant Contact information: [address] [place] [address] [phone]           Consultations:  None  Procedures/Studies:  2D Echo: None obtained this admission.  US Renal  Result Date: 01/15/2019 CLINICAL DATA:  85 y/o  F; acute renal failure. EXAM: RENAL / URINARY TRACT ULTRASOUND COMPLETE COMPARISON:  04/09/2013 CT abdomen and pelvis FINDINGS: Right Kidney: Renal measurements: 10.1 x 4.3 x 4.1 cm = volume: 95.1 mL. Echogenicity within normal limits. No mass visualized. Mild right hydronephrosis. Left Kidney: Renal measurements: 9.1 x 5.1 x 4.7 cm = volume: 117.0 mL. Echogenicity within normal limits. No mass or hydronephrosis visualized. Bladder: Appears normal for degree of bladder distention. IMPRESSION: Mild right hydronephrosis. Electronically Signed   By: Kristine Garbe M.D.   On: 01/15/2019 02:52   Dg Chest Port 1 View  Result Date: 01/14/2019 CLINICAL DATA:  Generalized weakness for 1 month EXAM: PORTABLE CHEST 1 VIEW COMPARISON:  02/07/2016 FINDINGS: Cardiac shadows within normal limits. Aortic calcifications are again noted. Calcified granuloma in the left lung is noted. No focal infiltrate or sizable effusion is seen. No bony abnormality is noted. IMPRESSION: No acute abnormality seen. Electronically Signed   By: Inez Catalina M.D.   On: 01/14/2019 20:42     Subjective: No major events overnight of this morning.  No complaint this morning.  Denies chest pain, dyspnea, abdominal pain or urinary issues.  Excited to go  home.  Discharge Exam: Vitals:   01/17/19 0352 01/17/19 1230  BP: 134/85 (!) 133/47  Pulse: 61 (!) 51  Resp: 18 18  Temp: 99.3 F (37.4 C) 97.8 F (36.6 C)  SpO2: 100% 96%    GENERAL: Appears well. No acute distress.  Sitting in bed eating breakfast. HEENT: MMM.  Vision and Hearing grossly intact.  NECK: Supple.  No JVD.  LUNGS:  No IWOB. Good air  movement. CTAB.  HEART:  RRR. Heart sounds normal. ABD: Bowel sounds present. Soft. Non tender.  EXT:   no edema bilaterally.  Significant muscle mass wasting. SKIN: no apparent skin lesion.  NEURO: Awake, alert and oriented appropriately.  No gross deficit.  PSYCH: Calm. Normal affect.    The results of significant diagnostics from this hospitalization (including imaging, microbiology, ancillary and laboratory) are listed below for reference.     Microbiology: No results found for this or any previous visit (from the past 240 hour(s)).   Labs: BNP (last 3 results) No results for input(s): BNP in the last 8760 hours. Basic Metabolic Panel: Recent Labs  Lab 01/14/19 2010 01/15/19 0505 01/16/19 0639 01/17/19 0501  NA 135 139 141 143  K 4.7 4.0 4.4 3.8  CL 100 103 108 113*  CO2 20* 24 19* 21*  GLUCOSE 129* 99 91 104*  BUN 150* 142* 119* 90*  CREATININE 4.91* 4.07* 2.69* 1.93*  CALCIUM 8.9 8.4* 8.9 8.4*   Liver Function Tests: Recent Labs  Lab 01/14/19 2010 01/15/19 0505  AST 16 13*  ALT 9 9  ALKPHOS 43 37*  BILITOT 0.6 0.6  PROT 6.8 5.9*  ALBUMIN 3.7 3.1*   Recent Labs  Lab 01/14/19 2010  LIPASE 34   No results for input(s): AMMONIA in the last 168 hours. CBC: Recent Labs  Lab 01/14/19 2010 01/15/19 0505 01/15/19 1630 01/16/19 0639 01/17/19 0501  WBC 4.2 4.2  --  5.3 5.0  NEUTROABS 3.0 2.4  --  3.5 3.6  HGB 7.4* 6.5* 9.9* 9.2* 8.6*  HCT 24.0* 19.9* 30.5* 29.3* 27.6*  MCV 89.6 87.7  --  89.1 89.0  PLT 215 199  --  159 179   Cardiac Enzymes: Recent Labs  Lab 01/14/19 2330 01/15/19 0730  01/15/19 1204 01/15/19 1751  CKTOTAL  --  102  --   --   TROPONINI 0.05* 0.07* 0.05* 0.05*   BNP: Invalid input(s): POCBNP CBG: No results for input(s): GLUCAP in the last 168 hours. D-Dimer No results for input(s): DDIMER in the last 72 hours. Hgb A1c No results for input(s): HGBA1C in the last 72 hours. Lipid Profile No results for input(s): CHOL, HDL, LDLCALC, TRIG, CHOLHDL, LDLDIRECT in the last 72 hours. Thyroid function studies Recent Labs    01/15/19 0505  TSH 1.918   Anemia work up Recent Labs    01/14/19 2330  VITAMINB12 2,362*  FOLATE 8.8  FERRITIN 15  TIBC 365  IRON 43  RETICCTPCT 1.3   Urinalysis    Component Value Date/Time   COLORURINE YELLOW 01/14/2019 2213   APPEARANCEUR CLEAR 01/14/2019 2213   LABSPEC 1.010 01/14/2019 2213   PHURINE 5.0 01/14/2019 2213   GLUCOSEU NEGATIVE 01/14/2019 2213   GLUCOSEU NEG mg/dL 02/20/2009 2049   HGBUR NEGATIVE 01/14/2019 2213   [place] 01/14/2019 2213   BILIRUBINUR neg 05/06/2018 1200   KETONESUR NEGATIVE 01/14/2019 2213   PROTEINUR NEGATIVE 01/14/2019 2213   UROBILINOGEN 0.2 05/06/2018 1200   UROBILINOGEN 0.2 04/25/2014 1023   NITRITE NEGATIVE 01/14/2019 2213   LEUKOCYTESUR TRACE (A) 01/14/2019 2213   Sepsis Labs Invalid input(s): PROCALCITONIN,  WBC,  LACTICIDVEN   Time coordinating discharge: 35 minutes  SIGNED:  Mercy Riding, MD  [facility] 01/17/2019, 5:51 PM Pager [phone]  If 7PM-7AM, please contact night-coverage [email or web address] Password [email or web address]

## 2019-01-17 NOTE — Progress Notes (Signed)
Discharge instructions, RX's and follow up appts explained and provided to patient verbalized understanding. Home health set up for patient and 3 in 1 and rolling walker delivered to room. patient left floor via wheelchair accompanied by staff.  Colbert Curenton, Tivis Ringer, RN

## 2019-01-17 NOTE — TOC Transition Note (Signed)
Transition of Care [facility]) - CM/SW Discharge Note   Patient Details  Name: Gina [name] MRN: [id number] Date of Birth: 11-09-1928  Transition [facility]) CM/SW Contact:  Claudie Leach, RN Phone Number: 01/17/2019, 1:12 PM   Clinical Narrative:   Pt to d/c home.  Pt from home alone.  Pt's niece, [name], is family contact to handle patient's affairs.  Pt has a Optician, dispensing for 2 hours/day through [facility]. Pt uses a RW at home but would like to have a 3n1.    Discussed with patient and [name], patient's niece.   Final next level of care: [place] Barriers to Discharge: No Barriers Identified   Patient Goals and CMS Choice Patient states their goals for this hospitalization and ongoing recovery are:: to go back home CMS Medicare.gov Compare Post Acute Care list provided to:: Patient Represenative (must comment)(Neice- [name]) Choice offered to / list presented to : Patient   Discharge Plan and Services  Referral called to Sharmon Revere with [facility].  3n1 ordered from [facility] to be delivered to room prior to d/c.               DME Arranged: 3-N-1 DME Agency: [facility] HH Arranged: PT, RN, Nurse's Aide [place] Agency: [facility]

## 2019-01-17 NOTE — Plan of Care (Signed)
  Problem: Education: Goal: Knowledge of General Education information will improve Description Including pain rating scale, medication(s)/side effects and non-pharmacologic comfort measures 01/17/2019 1454 by Emmaline Life, RN Outcome: Completed/Met 01/17/2019 1140 by Emmaline Life, RN Outcome: Progressing   Problem: Health Behavior/Discharge Planning: Goal: Ability to manage health-related needs will improve 01/17/2019 1454 by Emmaline Life, RN Outcome: Completed/Met 01/17/2019 1140 by Emmaline Life, RN Outcome: Progressing   Problem: Clinical Measurements: Goal: Ability to maintain clinical measurements within normal limits will improve 01/17/2019 1454 by Emmaline Life, RN Outcome: Completed/Met 01/17/2019 1140 by Emmaline Life, RN Outcome: Progressing Goal: Will remain free from infection 01/17/2019 1454 by Emmaline Life, RN Outcome: Completed/Met 01/17/2019 1140 by Emmaline Life, RN Outcome: Progressing Goal: Diagnostic test results will improve 01/17/2019 1454 by Emmaline Life, RN Outcome: Completed/Met 01/17/2019 1140 by Emmaline Life, RN Outcome: Progressing Goal: Respiratory complications will improve 01/17/2019 1454 by Emmaline Life, RN Outcome: Completed/Met 01/17/2019 1140 by Emmaline Life, RN Outcome: Progressing Goal: Cardiovascular complication will be avoided 01/17/2019 1454 by Emmaline Life, RN Outcome: Completed/Met 01/17/2019 1140 by Emmaline Life, RN Outcome: Progressing   Problem: Activity: Goal: Risk for activity intolerance will decrease 01/17/2019 1454 by Emmaline Life, RN Outcome: Completed/Met 01/17/2019 1140 by Emmaline Life, RN Outcome: Progressing   Problem: Nutrition: Goal: Adequate nutrition will be maintained 01/17/2019 1454 by Emmaline Life, RN Outcome: Completed/Met 01/17/2019 1140 by Emmaline Life, RN Outcome: Progressing   Problem: Coping: Goal: Level of anxiety will  decrease 01/17/2019 1454 by Emmaline Life, RN Outcome: Completed/Met 01/17/2019 1140 by Emmaline Life, RN Outcome: Progressing   Problem: Elimination: Goal: Will not experience complications related to bowel motility 01/17/2019 1454 by Emmaline Life, RN Outcome: Completed/Met 01/17/2019 1140 by Emmaline Life, RN Outcome: Progressing Goal: Will not experience complications related to urinary retention 01/17/2019 1454 by Emmaline Life, RN Outcome: Completed/Met 01/17/2019 1140 by Emmaline Life, RN Outcome: Progressing   Problem: Pain Managment: Goal: General experience of comfort will improve 01/17/2019 1454 by Emmaline Life, RN Outcome: Completed/Met 01/17/2019 1140 by Emmaline Life, RN Outcome: Progressing   Problem: Safety: Goal: Ability to remain free from injury will improve 01/17/2019 1454 by Emmaline Life, RN Outcome: Completed/Met 01/17/2019 1140 by Emmaline Life, RN Outcome: Progressing   Problem: Skin Integrity: Goal: Risk for impaired skin integrity will decrease 01/17/2019 1454 by Emmaline Life, RN Outcome: Completed/Met 01/17/2019 1140 by Emmaline Life, RN Outcome: Progressing

## 2019-01-17 NOTE — Progress Notes (Signed)
Physical Therapy Treatment Patient Details Name: Gina [name] MRN: [id number] DOB: 1929-03-01 Today's Date: 01/17/2019    History of Present Illness Patient is a 85 y/o female who presents with 2 weeks of weakness. Hgb 6.5. Found to have ARF, s/p transfusion. PMH includes anemia, PVD, TB,. HTN, HLD, DM, deperssion, CHF.     PT Comments    Session focusing on use of RW to improve safety at home and decrease risk of falls. Agree with evaluating therapist that [facility] is not sufficient and advised patient to utilize RW until she can address strength and balance deficits further at next venue. Pt does not present as a fall risk at this time.     Follow Up Recommendations  Home health PT;Supervision for mobility/OOB     Equipment Recommendations  None recommended by PT    Recommendations for Other Services       Precautions / Restrictions Precautions Precautions: Fall Restrictions Weight Bearing Restrictions: No    Mobility  Bed Mobility Overal bed mobility: Needs Assistance Bed Mobility: Rolling;Sidelying to Sit Rolling: Min guard Sidelying to sit: Min guard;HOB elevated       General bed mobility comments: Increased effort to get to EOB but no assist needed, use of rail.   Transfers Overall transfer level: Needs assistance Equipment used: Rolling walker (2 wheeled) Transfers: Sit to/from Stand Sit to Stand: Min assist            Ambulation/Gait Ambulation/Gait assistance: Min guard Gait Distance (Feet): 150 Feet Assistive device: Rolling walker (2 wheeled) Gait Pattern/deviations: Step-to pattern;Step-through pattern;Narrow base of support;Staggering right;Decreased stride length Gait velocity: decreased   General Gait Details: reinforcd need for RW, ambulating without overt LOB but poor awarness and obstacale avoidance.    Stairs             Wheelchair Mobility    Modified Rankin (Stroke Patients Only)       Balance Overall balance  assessment: Needs assistance;History of Falls Sitting-balance support: Feet supported;No upper extremity supported Sitting balance-Leahy Scale: Good Sitting balance - Comments: Able to reach outside BoS and donn shoes without difficulty or LOB.     Standing balance-Leahy Scale: Poor                              Cognition Arousal/Alertness: Awake/alert Behavior During Therapy: WFL for tasks assessed/performed Overall Cognitive Status: No family/caregiver present to determine baseline cognitive functioning                                        Exercises      General Comments        Pertinent Vitals/Pain Pain Assessment: No/denies pain    Home Living                      Prior Function            PT Goals (current goals can now be found in the care plan section) Acute Rehab PT Goals Patient Stated Goal: to get stronger and get home PT Goal Formulation: With patient Time For Goal Achievement: 01/29/19 Potential to Achieve Goals: Good Progress towards PT goals: Progressing toward goals    Frequency    Min 3X/week      PT Plan Current plan remains appropriate    Co-evaluation  AM-PAC PT "6 Clicks" Mobility   Outcome Measure  Help needed turning from your back to your side while in a flat bed without using bedrails?: A Little Help needed moving from lying on your back to sitting on the side of a flat bed without using bedrails?: A Little Help needed moving to and from a bed to a chair (including a wheelchair)?: A Little Help needed standing up from a chair using your arms (e.g., wheelchair or bedside chair)?: A Little Help needed to walk in hospital room?: A Little Help needed climbing 3-5 steps with a railing? : A Little 6 Click Score: 18    End of Session Equipment Utilized During Treatment: Gait belt Activity Tolerance: Patient tolerated treatment well Patient left: in chair;with call bell/phone  within reach;with chair alarm set Nurse Communication: Mobility status PT Visit Diagnosis: Difficulty in walking, not elsewhere classified (R26.2);Unsteadiness on feet (R26.81);Muscle weakness (generalized) (M62.81);Repeated falls (R29.6)     Time: 6433-2951 PT Time Calculation (min) (ACUTE ONLY): 27 min  Charges:  $Gait Training: 8-22 mins $Therapeutic Activity: 8-22 mins                    Reinaldo Berber, PT, DPT Acute Rehabilitation Services Pager: [phone] Office: [phone]   Reinaldo Berber 01/17/2019, 8:55 AM

## 2019-01-17 NOTE — Discharge Instructions (Signed)
Iron Deficiency Anemia, Adult Iron-deficiency anemia is when you have a low amount of red blood cells or hemoglobin. This happens because you have too little iron in your body. Hemoglobin carries oxygen to parts of the body. Anemia can cause your body to not get enough oxygen. It may or may not cause symptoms. Follow these instructions at home: Medicines  Take over-the-counter and prescription medicines only as told by your doctor. This includes iron pills (supplements) and vitamins.  If you cannot handle taking iron pills by mouth, ask your doctor about getting iron through: ? A vein (intravenously). ? A shot (injection) into a muscle.  Take iron pills when your stomach is empty. If you cannot handle this, take them with food.  Do not drink milk or take antacids at the same time as your iron pills.  To prevent trouble pooping (constipation), eat fiber or take medicine (stool softener) as told by your doctor. Eating and drinking   Talk with your doctor before changing the foods you eat. He or she may tell you to eat foods that have a lot of iron, such as: ? Liver. ? Lowfat (lean) beef. ? Breads and cereals that have iron added to them (fortified breads and cereals). ? Eggs. ? Dried fruit. ? Dark green, leafy vegetables.  Drink enough fluid to keep your pee (urine) clear or pale yellow.  Eat fresh fruits and vegetables that are high in vitamin C. They help your body to use iron. Foods with a lot of vitamin C include: ? Oranges. ? Peppers. ? Tomatoes. ? Mangoes. General instructions  Return to your normal activities as told by your doctor. Ask your doctor what activities are safe for you.  Keep yourself clean, and keep things clean around you (your surroundings). Anemia can make you get sick more easily.  Keep all follow-up visits as told by your doctor. This is important. Contact a doctor if:  You feel sick to your stomach (nauseous).  You throw up (vomit).  You feel  weak.  You are sweating for no clear reason.  You have trouble pooping, such as: ? Pooping (having a bowel movement) less than 3 times a week. ? Straining to poop. ? Having poop that is hard, dry, or larger than normal. ? Feeling full or bloated. ? Pain in the lower belly. ? Not feeling better after pooping. Get help right away if:  You pass out (faint). If this happens, do not drive yourself to the hospital. Call your local emergency services (911 in the [place]  You have chest pain.  You have shortness of breath that: ? Is very bad. ? Gets worse with physical activity.  You have a fast heartbeat.  You get light-headed when getting up from sitting or lying down. This information is not intended to replace advice given to you by your health care provider. Make sure you discuss any questions you have with your health care provider. Document Released: 10/19/2010 Document Revised: 06/05/2016 Document Reviewed: 06/05/2016 Elsevier Interactive Patient Education  2019 Elsevier Inc.   Acute Kidney Injury, Adult  Acute kidney injury is a sudden worsening of kidney function. The kidneys are organs that have several jobs. They filter the blood to remove waste products and extra fluid. They also maintain a healthy balance of minerals and hormones in the body, which helps control blood pressure and keep bones strong. With this condition, your kidneys do not do their jobs as well as they should. This condition ranges from mild to  severe. Over time it may develop into long-lasting (chronic) kidney disease. Early detection and treatment may prevent acute kidney injury from developing into a chronic condition. What are the causes? Common causes of this condition include:  A problem with blood flow to the kidneys. This may be caused by: ? Low blood pressure (hypotension) or shock. ? Blood loss. ? Heart and blood vessel (cardiovascular) disease. ? Severe burns. ? Liver disease.  Direct  damage to the kidneys. This may be caused by: ? Certain medicines. ? A kidney infection. ? Poisoning. ? Being around or in contact with toxic substances. ? A surgical wound. ? A hard, direct hit to the kidney area.  A sudden blockage of urine flow. This may be caused by: ? Cancer. ? Kidney stones. ? An enlarged prostate in males. What are the signs or symptoms? Symptoms of this condition may not be obvious until the condition becomes severe. Symptoms of this condition can include:  Tiredness (lethargy), or difficulty staying awake.  Nausea or vomiting.  Swelling (edema) of the face, legs, ankles, or feet.  Problems with urination, such as: ? Abdominal pain, or pain along the side of your stomach (flank). ? Decreased urine production. ? Decrease in the force of urine flow.  Muscle twitches and cramps, especially in the legs.  Confusion or trouble concentrating.  Loss of appetite.  Fever. How is this diagnosed? This condition may be diagnosed with tests, including:  Blood tests.  Urine tests.  Imaging tests.  A test in which a sample of tissue is removed from the kidneys to be examined under a microscope (kidney biopsy). How is this treated? Treatment for this condition depends on the cause and how severe the condition is. In mild cases, treatment may not be needed. The kidneys may heal on their own. In more severe cases, treatment will involve:  Treating the cause of the kidney injury. This may involve changing any medicines you are taking or adjusting your dosage.  Fluids. You may need specialized IV fluids to balance your body's needs.  Having a catheter placed to drain urine and prevent blockages.  Preventing problems from occurring. This may mean avoiding certain medicines or procedures that can cause further injury to the kidneys. In some cases treatment may also require:  A procedure to remove toxic wastes from the body (dialysis or continuous renal  replacement therapy - CRRT).  Surgery. This may be done to repair a torn kidney, or to remove the blockage from the urinary system. Follow these instructions at home: Medicines  Take over-the-counter and prescription medicines only as told by your health care provider.  Do not take any new medicines without your health care provider's approval. Many medicines can worsen your kidney damage.  Do not take any vitamin and mineral supplements without your health care provider's approval. Many nutritional supplements can worsen your kidney damage. Lifestyle  If your health care provider prescribed changes to your diet, follow them. You may need to decrease the amount of protein you eat.  Achieve and maintain a healthy weight. If you need help with this, ask your health care provider.  Start or continue an exercise plan. Try to exercise at least 30 minutes a day, 5 days a week.  Do not use any tobacco products, such as cigarettes, chewing tobacco, and e-cigarettes. If you need help quitting, ask your health care provider. General instructions  Keep track of your blood pressure. Report changes in your blood pressure as told by your health  care provider.  Stay up to date with immunizations. Ask your health care provider which immunizations you need.  Keep all follow-up visits as told by your health care provider. This is important. Where to find more information  American Association of Kidney Patients: BombTimer.gl  National Kidney Foundation: www.kidney.[place]: https://mathis.com/  Life Options Rehabilitation Program: ? www.lifeoptions.org ? www.kidneyschool.org Contact a health care provider if:  Your symptoms get worse.  You develop new symptoms. Get help right away if:  You develop symptoms of worsening kidney disease, which include: ? Headaches. ? Abnormally dark or light skin. ? Easy bruising. ? Frequent hiccups. ? Chest pain. ? Shortness of  breath. ? End of menstruation in women. ? Seizures. ? Confusion or altered mental status. ? Abdominal or back pain. ? Itchiness.  You have a fever.  Your body is producing less urine.  You have pain or bleeding when you urinate. Summary  Acute kidney injury is a sudden worsening of kidney function.  Acute kidney injury can be caused by problems with blood flow to the kidneys, direct damage to the kidneys, and sudden blockage of urine flow.  Symptoms of this condition may not be obvious until it becomes severe. Symptoms may include edema, lethargy, confusion, nausea or vomiting, and problems passing urine.  This condition can usually be diagnosed with blood tests, urine tests, and imaging tests. Sometimes a kidney biopsy is done to diagnose this condition.  Treatment for this condition often involves treating the underlying cause. It is treated with fluids, medicines, dialysis, diet changes, or surgery. This information is not intended to replace advice given to you by your health care provider. Make sure you discuss any questions you have with your health care provider. Document Released: 04/01/2011 Document Revised: 01/16/2017 Document Reviewed: 09/06/2016 Elsevier Interactive Patient Education  2019 [employer].

## 2019-01-17 NOTE — Plan of Care (Signed)

## 2019-01-18 ENCOUNTER — Other Ambulatory Visit: Payer: Self-pay | Admitting: Primary Care

## 2019-01-18 ENCOUNTER — Telehealth: Payer: Self-pay | Admitting: Physician Assistant

## 2019-01-18 DIAGNOSIS — R5381 Other malaise: Secondary | ICD-10-CM

## 2019-01-18 LAB — PROTEIN ELECTROPHORESIS, SERUM
A/G Ratio: 1 (ref 0.7–1.7)
Albumin ELP: 3.6 g/dL (ref 2.9–4.4)
Alpha-1-Globulin: 0.3 g/dL (ref 0.0–0.4)
Alpha-2-Globulin: 0.9 g/dL (ref 0.4–1.0)
Beta Globulin: 0.9 g/dL (ref 0.7–1.3)
Gamma Globulin: 1.3 g/dL (ref 0.4–1.8)
Globulin, Total: 3.5 g/dL (ref 2.2–3.9)
Total Protein ELP: 7.1 g/dL (ref 6.0–8.5)

## 2019-01-18 NOTE — Telephone Encounter (Signed)
Please place a home health order for patient.

## 2019-01-18 NOTE — Telephone Encounter (Signed)
Pt niece called in stated that the patient need a nurse home aide to be referred to go to pts home she stated they would prefer  [facility] please follow up

## 2019-01-21 ENCOUNTER — Other Ambulatory Visit: Payer: Self-pay | Admitting: Primary Care

## 2019-01-21 DIAGNOSIS — K5900 Constipation, unspecified: Secondary | ICD-10-CM

## 2019-01-27 ENCOUNTER — Encounter (HOSPITAL_COMMUNITY): Payer: Self-pay

## 2019-01-27 ENCOUNTER — Other Ambulatory Visit: Payer: Self-pay

## 2019-01-27 ENCOUNTER — Emergency Department (HOSPITAL_COMMUNITY)
Admission: EM | Admit: 2019-01-27 | Discharge: 2019-01-27 | Disposition: A | Discharge status: Home / Self Care | Payer: Medicare Other | Attending: Emergency Medicine | Admitting: Emergency Medicine

## 2019-01-27 DIAGNOSIS — I10 Essential (primary) hypertension: Secondary | ICD-10-CM | POA: Insufficient documentation

## 2019-01-27 DIAGNOSIS — M79675 Pain in left toe(s): Secondary | ICD-10-CM | POA: Diagnosis present

## 2019-01-27 DIAGNOSIS — Z87891 Personal history of nicotine dependence: Secondary | ICD-10-CM | POA: Insufficient documentation

## 2019-01-27 DIAGNOSIS — Z79899 Other long term (current) drug therapy: Secondary | ICD-10-CM | POA: Insufficient documentation

## 2019-01-27 NOTE — Discharge Instructions (Signed)
Place a pad in between the 2 toes.  They sell these commercially at the pharmacy.  Alternatively or in addition you could also wear 5 toed socks.  Please follow-up with your family doctor.  I have attached a podiatrist or a foot doctor information in case you want to follow-up with them in the office.

## 2019-01-27 NOTE — ED Notes (Signed)
Gauze placed between pinky toe and third toe. Paper tape applied. Pt states that she is more comfortable.

## 2019-01-27 NOTE — ED Triage Notes (Signed)
Per pt her second to last toe on the left foot hurts. No necroses present. PMS intact.
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
## 2019-01-27 NOTE — ED Notes (Signed)
Son [phone]

## 2019-01-29 ENCOUNTER — Telehealth: Payer: Self-pay | Admitting: Pharmacist

## 2019-01-29 NOTE — Telephone Encounter (Signed)
Received request from [facility] for HCTZ. This rx was d/c by hospital on 01/14/19 d/t AKI. She has not been seen by PCP after that hospitalization. Recommend not to refill until seen and re-evaluated. Will send to PCP.

## 2019-01-31 NOTE — Telephone Encounter (Signed)
Denied HCTZ

## 2019-02-01 ENCOUNTER — Encounter: Payer: Self-pay | Admitting: Primary Care

## 2019-02-01 ENCOUNTER — Other Ambulatory Visit: Payer: Self-pay

## 2019-02-01 ENCOUNTER — Ambulatory Visit: Payer: Medicare Other | Attending: Primary Care | Admitting: Primary Care

## 2019-02-01 DIAGNOSIS — S90414A Abrasion, right lesser toe(s), initial encounter: Secondary | ICD-10-CM

## 2019-02-01 DIAGNOSIS — X58XXXA Exposure to other specified factors, initial encounter: Secondary | ICD-10-CM | POA: Diagnosis not present

## 2019-02-01 NOTE — Progress Notes (Signed)
Virtual Visit via Telephone Note  I connected with Gina [name] on 02/01/19 at  2:10 PM EDT by telephone and verified that I am speaking with the correct person using two identifiers.   I discussed the limitations, risks, security and privacy concerns of performing an evaluation and management service by telephone and the availability of in person appointments. I also discussed with the patient that there may be a patient responsible charge related to this service. The patient expressed understanding and agreed to proceed.   History of Present Illness: Patient was complaining about a darken color toe that was painful . She denies any injury after prolong time she was able to send me a picture of her toe of concerned. I asked patient if I could forward to Dr. Sharol Given patient and caregiver her niece agree. Dr. Sharol Given responded he would be glad to see her tomorrow an appointment made for 02/02/2019 at 9:00 am patient and niece happy.    Observations/Objective: Review of Systems  Constitutional: Positive for malaise/fatigue and weight loss.  HENT: Negative.   Eyes: Negative.   Respiratory: Positive for shortness of breath.   Cardiovascular: Negative.   Genitourinary: Negative.   Musculoskeletal:       Darken toe painful unkown etiology   Skin: Negative.   Neurological: Positive for dizziness and headaches.  Psychiatric/Behavioral: Positive for memory loss.     Assessment and Plan: Diagnoses and all orders for this visit:  Abrasion of toe of right foot, initial encounter -     AMB referral to orthopedics See HPI   Follow Up Instructions:    I discussed the assessment and treatment plan with the patient. The patient was provided an opportunity to ask questions and all were answered. The patient agreed with the plan and demonstrated an understanding of the instructions.   The patient was advised to call back or seek an in-person evaluation if the symptoms worsen or if the condition  fails to improve as anticipated.  I provided 15 minutes of non-face-to-face time during this encounter. This included reviewing ED notes contacting orthopaedist and acquiring pictures    [name], NP

## 2019-02-01 NOTE — Progress Notes (Signed)
refPatient verified DOB Patient has taken medication Patient has eaten today. Patient complains of forth toe being black and hurts through the night. Patient complains of pain for the past 3 weeks. Patient was seen by UC for the concern and declined imaging.

## 2019-02-02 ENCOUNTER — Ambulatory Visit (INDEPENDENT_AMBULATORY_CARE_PROVIDER_SITE_OTHER): Payer: Medicare Other | Admitting: Vascular Surgery

## 2019-02-02 ENCOUNTER — Encounter: Payer: Self-pay | Admitting: Orthopedic Surgery

## 2019-02-02 ENCOUNTER — Other Ambulatory Visit: Payer: Self-pay | Admitting: *Deleted

## 2019-02-02 ENCOUNTER — Encounter: Payer: Self-pay | Admitting: Vascular Surgery

## 2019-02-02 ENCOUNTER — Telehealth: Payer: Self-pay

## 2019-02-02 ENCOUNTER — Encounter: Payer: Self-pay | Admitting: *Deleted

## 2019-02-02 ENCOUNTER — Other Ambulatory Visit: Payer: Self-pay

## 2019-02-02 ENCOUNTER — Other Ambulatory Visit: Payer: Self-pay | Admitting: Orthopedic Surgery

## 2019-02-02 ENCOUNTER — Ambulatory Visit (INDEPENDENT_AMBULATORY_CARE_PROVIDER_SITE_OTHER): Payer: Medicare Other | Admitting: Orthopedic Surgery

## 2019-02-02 VITALS — BP 177/55 | HR 67 | Temp 96.8°F | Resp 16 | Ht 67.0 in | Wt 110.0 lb

## 2019-02-02 VITALS — Ht 67.0 in | Wt 102.0 lb

## 2019-02-02 DIAGNOSIS — I739 Peripheral vascular disease, unspecified: Secondary | ICD-10-CM | POA: Diagnosis not present

## 2019-02-02 DIAGNOSIS — I998 Other disorder of circulatory system: Secondary | ICD-10-CM

## 2019-02-02 DIAGNOSIS — M79672 Pain in left foot: Secondary | ICD-10-CM

## 2019-02-02 DIAGNOSIS — I999 Unspecified disorder of circulatory system: Secondary | ICD-10-CM

## 2019-02-02 NOTE — Addendum Note (Signed)
Addended by: Pamella Pert on: 02/02/2019 09:57 AM   Modules accepted: Orders
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
## 2019-02-02 NOTE — Telephone Encounter (Signed)
Called [facility] per Dr. Sharol Given worked in today for ischemic left foot. Advised to leave office now and go directly to [facility].
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
## 2019-02-03 ENCOUNTER — Encounter (HOSPITAL_COMMUNITY)
Admission: RE | Disposition: A | Discharge status: Home / Self Care | Payer: Self-pay | Source: Home / Self Care | Attending: Surgery

## 2019-02-03 ENCOUNTER — Ambulatory Visit (HOSPITAL_COMMUNITY)
Admission: RE | Admit: 2019-02-03 | Discharge: 2019-02-04 | Disposition: A | Discharge status: Home / Self Care | Payer: Medicare Other | Attending: Surgery | Admitting: Surgery

## 2019-02-03 ENCOUNTER — Other Ambulatory Visit: Payer: Self-pay

## 2019-02-03 DIAGNOSIS — Z853 Personal history of malignant neoplasm of breast: Secondary | ICD-10-CM | POA: Insufficient documentation

## 2019-02-03 DIAGNOSIS — M1612 Unilateral primary osteoarthritis, left hip: Secondary | ICD-10-CM | POA: Diagnosis not present

## 2019-02-03 DIAGNOSIS — Z87891 Personal history of nicotine dependence: Secondary | ICD-10-CM | POA: Insufficient documentation

## 2019-02-03 DIAGNOSIS — E11621 Type 2 diabetes mellitus with foot ulcer: Secondary | ICD-10-CM | POA: Diagnosis not present

## 2019-02-03 DIAGNOSIS — I509 Heart failure, unspecified: Secondary | ICD-10-CM | POA: Diagnosis not present

## 2019-02-03 DIAGNOSIS — Z79890 Hormone replacement therapy (postmenopausal): Secondary | ICD-10-CM | POA: Insufficient documentation

## 2019-02-03 DIAGNOSIS — D631 Anemia in chronic kidney disease: Secondary | ICD-10-CM | POA: Insufficient documentation

## 2019-02-03 DIAGNOSIS — E1151 Type 2 diabetes mellitus with diabetic peripheral angiopathy without gangrene: Secondary | ICD-10-CM | POA: Diagnosis not present

## 2019-02-03 DIAGNOSIS — N183 Chronic kidney disease, stage 3 (moderate): Secondary | ICD-10-CM | POA: Insufficient documentation

## 2019-02-03 DIAGNOSIS — I130 Hypertensive heart and chronic kidney disease with heart failure and stage 1 through stage 4 chronic kidney disease, or unspecified chronic kidney disease: Secondary | ICD-10-CM | POA: Diagnosis not present

## 2019-02-03 DIAGNOSIS — E7800 Pure hypercholesterolemia, unspecified: Secondary | ICD-10-CM | POA: Diagnosis not present

## 2019-02-03 DIAGNOSIS — Z79899 Other long term (current) drug therapy: Secondary | ICD-10-CM | POA: Insufficient documentation

## 2019-02-03 DIAGNOSIS — E1122 Type 2 diabetes mellitus with diabetic chronic kidney disease: Secondary | ICD-10-CM | POA: Insufficient documentation

## 2019-02-03 DIAGNOSIS — I70245 Atherosclerosis of native arteries of left leg with ulceration of other part of foot: Secondary | ICD-10-CM | POA: Insufficient documentation

## 2019-02-03 DIAGNOSIS — I779 Disorder of arteries and arterioles, unspecified: Secondary | ICD-10-CM | POA: Insufficient documentation

## 2019-02-03 DIAGNOSIS — Z95820 Peripheral vascular angioplasty status with implants and grafts: Secondary | ICD-10-CM | POA: Insufficient documentation

## 2019-02-03 DIAGNOSIS — L97529 Non-pressure chronic ulcer of other part of left foot with unspecified severity: Secondary | ICD-10-CM | POA: Insufficient documentation

## 2019-02-03 DIAGNOSIS — I70645 Atherosclerosis of nonbiological bypass graft(s) of the left leg with ulceration of other part of foot: Secondary | ICD-10-CM | POA: Diagnosis not present

## 2019-02-03 DIAGNOSIS — I739 Peripheral vascular disease, unspecified: Secondary | ICD-10-CM | POA: Diagnosis present

## 2019-02-03 DIAGNOSIS — E785 Hyperlipidemia, unspecified: Secondary | ICD-10-CM | POA: Diagnosis not present

## 2019-02-03 HISTORY — PX: PERIPHERAL VASCULAR BALLOON ANGIOPLASTY: CATH118281

## 2019-02-03 HISTORY — PX: ABDOMINAL AORTOGRAM: CATH118222

## 2019-02-03 HISTORY — PX: LOWER EXTREMITY ANGIOGRAPHY: CATH118251

## 2019-02-03 LAB — POCT I-STAT 4, (NA,K, GLUC, HGB,HCT)
Glucose, Bld: 93 mg/dL (ref 70–99)
HCT: 23 % — ABNORMAL LOW (ref 36.0–46.0)
Hemoglobin: 7.8 g/dL — ABNORMAL LOW (ref 12.0–15.0)
Potassium: 4.2 mmol/L (ref 3.5–5.1)
Sodium: 142 mmol/L (ref 135–145)

## 2019-02-03 LAB — GLUCOSE, CAPILLARY
Glucose-Capillary: 97 mg/dL (ref 70–99)
Glucose-Capillary: 135 mg/dL — ABNORMAL HIGH (ref 70–99)

## 2019-02-03 LAB — POCT ACTIVATED CLOTTING TIME: Activated Clotting Time: 378 seconds

## 2019-02-03 LAB — POCT I-STAT CREATININE: Creatinine, Ser: 1.9 mg/dL — ABNORMAL HIGH (ref 0.44–1.00)

## 2019-02-03 SURGERY — ABDOMINAL AORTOGRAM
Anesthesia: LOCAL

## 2019-02-03 MED ORDER — VITAMIN B-12 1000 MCG PO TABS
1000.0000 ug | ORAL_TABLET | Freq: Every day | ORAL | Status: DC
  Administered 2019-02-03 – 2019-02-04 (×2): 1000 ug via ORAL
  Filled 2019-02-03 (×2): qty 1

## 2019-02-03 MED ORDER — HYDRALAZINE HCL 20 MG/ML IJ SOLN
INTRAMUSCULAR | Status: DC | PRN
  Administered 2019-02-03: 10 mg via INTRAVENOUS

## 2019-02-03 MED ORDER — ONDANSETRON HCL 4 MG/2ML IJ SOLN: 4.0000 mg | Freq: Four times a day (QID) | INTRAMUSCULAR | Status: DC | PRN

## 2019-02-03 MED ORDER — HEPARIN (PORCINE) IN NACL 1000-0.9 UT/500ML-% IV SOLN
INTRAVENOUS | Status: DC | PRN
  Administered 2019-02-03 (×2): 500 mL

## 2019-02-03 MED ORDER — SODIUM CHLORIDE 0.9% FLUSH: 3.0000 mL | INTRAVENOUS | Status: DC | PRN

## 2019-02-03 MED ORDER — FERROUS SULFATE 325 (65 FE) MG PO TABS
325.0000 mg | ORAL_TABLET | Freq: Every day | ORAL | Status: DC
  Administered 2019-02-03 – 2019-02-04 (×2): 325 mg via ORAL
  Filled 2019-02-03 (×2): qty 1

## 2019-02-03 MED ORDER — LABETALOL HCL 5 MG/ML IV SOLN: 10.0000 mg | INTRAVENOUS | Status: DC | PRN

## 2019-02-03 MED ORDER — SODIUM CHLORIDE 0.9 % WEIGHT BASED INFUSION: 1.0000 mL/kg/h | INTRAVENOUS | Status: AC

## 2019-02-03 MED ORDER — SODIUM CHLORIDE 0.9 % IV SOLN: 250.0000 mL | INTRAVENOUS | Status: DC | PRN

## 2019-02-03 MED ORDER — CHOLECALCIFEROL 10 MCG (400 UNIT) PO TABS
400.0000 [IU] | ORAL_TABLET | Freq: Every day | ORAL | Status: DC
  Administered 2019-02-03 – 2019-02-04 (×2): 400 [IU] via ORAL
  Filled 2019-02-03 (×2): qty 1

## 2019-02-03 MED ORDER — BOOST PO LIQD
237.0000 mL | Freq: Two times a day (BID) | ORAL | Status: DC
  Administered 2019-02-03: 237 mL via ORAL
  Filled 2019-02-03 (×3): qty 237

## 2019-02-03 MED ORDER — HEPARIN (PORCINE) IN NACL 1000-0.9 UT/500ML-% IV SOLN
INTRAVENOUS | Status: AC
  Filled 2019-02-03: qty 1000

## 2019-02-03 MED ORDER — HYDROCHLOROTHIAZIDE 25 MG PO TABS
25.0000 mg | ORAL_TABLET | Freq: Every day | ORAL | Status: DC
  Administered 2019-02-03 – 2019-02-04 (×2): 25 mg via ORAL
  Filled 2019-02-03 (×2): qty 1

## 2019-02-03 MED ORDER — LEVOTHYROXINE SODIUM 88 MCG PO TABS
88.0000 ug | ORAL_TABLET | Freq: Every day | ORAL | Status: DC
  Administered 2019-02-04: 88 ug via ORAL
  Filled 2019-02-03: qty 1

## 2019-02-03 MED ORDER — HYDRALAZINE HCL 20 MG/ML IJ SOLN
INTRAMUSCULAR | Status: AC
  Filled 2019-02-03: qty 1

## 2019-02-03 MED ORDER — SODIUM CHLORIDE 0.9% FLUSH
3.0000 mL | Freq: Two times a day (BID) | INTRAVENOUS | Status: DC
  Administered 2019-02-04 (×2): 3 mL via INTRAVENOUS

## 2019-02-03 MED ORDER — LINACLOTIDE 145 MCG PO CAPS
145.0000 ug | ORAL_CAPSULE | Freq: Every day | ORAL | Status: DC
  Administered 2019-02-04: 145 ug via ORAL
  Filled 2019-02-03 (×2): qty 1

## 2019-02-03 MED ORDER — MIRTAZAPINE 7.5 MG PO TABS
7.5000 mg | ORAL_TABLET | Freq: Every day | ORAL | Status: DC
  Administered 2019-02-03: 7.5 mg via ORAL
  Filled 2019-02-03 (×2): qty 1

## 2019-02-03 MED ORDER — ATORVASTATIN CALCIUM 10 MG PO TABS
20.0000 mg | ORAL_TABLET | Freq: Every day | ORAL | Status: DC
  Administered 2019-02-03: 20 mg via ORAL
  Filled 2019-02-03: qty 2

## 2019-02-03 MED ORDER — AMLODIPINE BESYLATE 10 MG PO TABS
10.0000 mg | ORAL_TABLET | Freq: Every day | ORAL | Status: DC
  Administered 2019-02-03 – 2019-02-04 (×2): 10 mg via ORAL
  Filled 2019-02-03 (×2): qty 1

## 2019-02-03 MED ORDER — ASPIRIN EC 81 MG PO TBEC
81.0000 mg | DELAYED_RELEASE_TABLET | Freq: Every day | ORAL | Status: DC
  Administered 2019-02-03 – 2019-02-04 (×2): 81 mg via ORAL
  Filled 2019-02-03 (×2): qty 1

## 2019-02-03 MED ORDER — LIDOCAINE HCL (PF) 1 % IJ SOLN
INTRAMUSCULAR | Status: DC | PRN
  Administered 2019-02-03: 12 mL

## 2019-02-03 MED ORDER — HEPARIN SODIUM (PORCINE) 1000 UNIT/ML IJ SOLN
INTRAMUSCULAR | Status: DC | PRN
  Administered 2019-02-03: 3500 [IU] via INTRAVENOUS
  Administered 2019-02-03: 5000 [IU] via INTRAVENOUS
  Administered 2019-02-03: 3000 [IU] via INTRAVENOUS

## 2019-02-03 MED ORDER — CALCIUM CARBONATE 1250 (500 CA) MG PO TABS
500.0000 mg | ORAL_TABLET | Freq: Every day | ORAL | Status: DC
  Administered 2019-02-04: 500 mg via ORAL
  Filled 2019-02-03 (×3): qty 1

## 2019-02-03 MED ORDER — LIDOCAINE HCL (PF) 1 % IJ SOLN
INTRAMUSCULAR | Status: AC
  Filled 2019-02-03: qty 30

## 2019-02-03 MED ORDER — HEPARIN SODIUM (PORCINE) 1000 UNIT/ML IJ SOLN
INTRAMUSCULAR | Status: AC
  Filled 2019-02-03: qty 1

## 2019-02-03 MED ORDER — HYDRALAZINE HCL 20 MG/ML IJ SOLN: 5.0000 mg | INTRAMUSCULAR | Status: DC | PRN

## 2019-02-03 MED ORDER — HYDRALAZINE HCL 25 MG PO TABS
25.0000 mg | ORAL_TABLET | Freq: Three times a day (TID) | ORAL | Status: DC
  Administered 2019-02-03 – 2019-02-04 (×2): 25 mg via ORAL
  Filled 2019-02-03 (×3): qty 1

## 2019-02-03 MED ORDER — FENTANYL CITRATE (PF) 100 MCG/2ML IJ SOLN
INTRAMUSCULAR | Status: DC | PRN
  Administered 2019-02-03: 25 ug via INTRAVENOUS

## 2019-02-03 MED ORDER — LATANOPROST 0.005 % OP SOLN
1.0000 [drp] | Freq: Every day | OPHTHALMIC | Status: DC
  Administered 2019-02-03: 1 [drp] via OPHTHALMIC
  Filled 2019-02-03: qty 2.5

## 2019-02-03 MED ORDER — IODIXANOL 320 MG/ML IV SOLN
INTRAVENOUS | Status: DC | PRN
  Administered 2019-02-03: 120 mL via INTRAVENOUS

## 2019-02-03 MED ORDER — FENTANYL CITRATE (PF) 100 MCG/2ML IJ SOLN
INTRAMUSCULAR | Status: AC
  Filled 2019-02-03: qty 2

## 2019-02-03 MED ORDER — NETARSUDIL DIMESYLATE 0.02 % OP SOLN: 1.0000 [drp] | Freq: Every day | OPHTHALMIC | Status: DC

## 2019-02-03 MED ORDER — MIDAZOLAM HCL 2 MG/2ML IJ SOLN
INTRAMUSCULAR | Status: DC | PRN
  Administered 2019-02-03: 0.5 mg via INTRAVENOUS

## 2019-02-03 MED ORDER — ACETAMINOPHEN 325 MG PO TABS
650.0000 mg | ORAL_TABLET | ORAL | Status: DC | PRN
  Administered 2019-02-04: 650 mg via ORAL
  Filled 2019-02-03: qty 2

## 2019-02-03 MED ORDER — SENNA 8.6 MG PO TABS
1.0000 | ORAL_TABLET | Freq: Every day | ORAL | Status: DC
  Administered 2019-02-03 – 2019-02-04 (×2): 8.6 mg via ORAL
  Filled 2019-02-03 (×2): qty 1

## 2019-02-03 MED ORDER — VITAMIN D3 10 MCG (400 UNIT) PO TABS
400.0000 [IU] | ORAL_TABLET | Freq: Every day | ORAL | Status: DC
  Filled 2019-02-03: qty 1

## 2019-02-03 MED ORDER — ADULT MULTIVITAMIN W/MINERALS CH
1.0000 | ORAL_TABLET | Freq: Every day | ORAL | Status: DC
  Administered 2019-02-03 – 2019-02-04 (×2): 1 via ORAL
  Filled 2019-02-03 (×2): qty 1

## 2019-02-03 MED ORDER — MIDAZOLAM HCL 2 MG/2ML IJ SOLN
INTRAMUSCULAR | Status: AC
  Filled 2019-02-03: qty 2

## 2019-02-03 MED ORDER — SODIUM CHLORIDE 0.9 % IV SOLN
INTRAVENOUS | Status: DC
  Administered 2019-02-03: 13:00:00 via INTRAVENOUS

## 2019-02-03 MED ORDER — ENSURE ENLIVE PO LIQD: 237.0000 mL | Freq: Two times a day (BID) | ORAL | Status: DC

## 2019-02-03 MED ORDER — BRIMONIDINE TARTRATE 0.2 % OP SOLN
1.0000 [drp] | Freq: Two times a day (BID) | OPHTHALMIC | Status: DC
  Administered 2019-02-03 – 2019-02-04 (×2): 1 [drp] via OPHTHALMIC
  Filled 2019-02-03 (×2): qty 5

## 2019-02-03 SURGICAL SUPPLY — 24 items
BALL STERLING OTW 2.5X100X150 (BALLOONS) ×1
BALLN STERLING OTW 2.5X100X150 (BALLOONS) ×2
BALLOON STRLNG OTW 2.5X100X150 (BALLOONS) IMPLANT
CATH OMNI FLUSH 5F 65CM (CATHETERS) ×1 IMPLANT
CLOSURE MYNX CONTROL 6F/7F (Vascular Products) ×1 IMPLANT
DEVICE TORQUE H2O (MISCELLANEOUS) ×1 IMPLANT
FILTER CO2 0.2 MICRON (VASCULAR PRODUCTS) ×1 IMPLANT
GUIDEWIRE ANGLED .035X150CM (WIRE) ×1 IMPLANT
KIT ENCORE 26 ADVANTAGE (KITS) ×1 IMPLANT
KIT MICROPUNCTURE NIT STIFF (SHEATH) ×1 IMPLANT
KIT PV (KITS) ×3 IMPLANT
RESERVOIR CO2 (VASCULAR PRODUCTS) ×1 IMPLANT
SET FLUSH CO2 (MISCELLANEOUS) ×1 IMPLANT
SHEATH PINNACLE 5F 10CM (SHEATH) ×1 IMPLANT
SHEATH PINNACLE 6F 10CM (SHEATH) ×1 IMPLANT
SHEATH PINNACLE ST 6F 65CM (SHEATH) ×1 IMPLANT
SHEATH PROBE COVER 6X72 (BAG) ×1 IMPLANT
STOPCOCK MORSE 400PSI 3WAY (MISCELLANEOUS) ×1 IMPLANT
SYR MEDRAD MARK V 150ML (SYRINGE) ×1 IMPLANT
TRANSDUCER W/STOPCOCK (MISCELLANEOUS) ×3 IMPLANT
TRAY PV CATH (CUSTOM PROCEDURE TRAY) ×3 IMPLANT
WIRE BENTSON .035X145CM (WIRE) ×1 IMPLANT
WIRE G V18X300CM (WIRE) ×3 IMPLANT
WIRE ROSEN-J .035X180CM (WIRE) ×1 IMPLANT

## 2019-02-03 NOTE — Interval H&P Note (Signed)
History and Physical Interval Note:  02/03/2019 1:39 PM  Gina [name]  has presented today for surgery, with the diagnosis of pvd ulcer.  The various methods of treatment have been discussed with the patient and family. After consideration of risks, benefits and other options for treatment, the patient has consented to  Procedure(s): ABDOMINAL AORTOGRAM W/LOWER EXTREMITY (N/A) as a surgical intervention.  The patient's history has been reviewed, patient examined, no change in status, stable for surgery.  I have reviewed the patient's chart and labs.  Questions were answered to the patient's satisfaction.     Annamarie Major

## 2019-02-03 NOTE — Op Note (Signed)
Patient name: Gina [name] MRN: [id number] DOB: 09-19-1929 Sex: female  02/03/2019 Pre-operative Diagnosis: Left toe ulcer Post-operative diagnosis:  Same Surgeon:  Annamarie Major Procedure Performed:  1.  Ultrasound-guided access, right femoral artery  2.  Abdominal aortogram with CO2  3.  Left lower extremity runoff  4.  Failed angioplasty, left peroneal artery  5.  Angioplasty, left femoral-popliteal bypass graft  6.  Closure device (Mynx)  7.  Conscious sedation (98 minutes)   Indications: Patient has a history of bilateral femoral-popliteal bypass graft with Gore-Tex.approximately 20 years ago.  She has developed a wound on her toe and comes in for angiographic evaluation.  Procedure:  The patient was identified in the holding area and taken to room 8.  The patient was then placed supine on the table and prepped and draped in the usual sterile fashion.  A time out was called.  Conscious sedation was administered with the use of IV fentanyl and Versed under continuous physician and nurse monitoring.  Heart rate, blood pressure, and oxygen saturation were continuously monitored.  Total sedation time was 98 minutes.  Ultrasound was used to evaluate the right common femoral artery.  It was patent .  A digital ultrasound image was acquired.  A micropuncture needle was used to access the right common femoral artery under ultrasound guidance.  An 018 wire was advanced without resistance and a micropuncture sheath was placed.  The 018 wire was removed and a benson wire was placed.  The micropuncture sheath was exchanged for a 5 french sheath.  An omniflush catheter was advanced over the wire to the level of L-1.  An abdominal angiogram with CO2 was obtained.  Next, using the omniflush catheter and a benson wire, the aortic bifurcation was crossed and the catheter was placed into theleft external iliac artery and left runoff was obtained with a combination of CO2 and IV contrast  Findings:   Aortogram: The renal arteries are not well evaluated but appear to be patent and heavily calcified.  The infrarenal abdominal aorta is patent without stenosis however it is heavily calcified.  Bilateral common and external iliac arteries are heavily calcified but widely patent.  Right Lower Extremity: Not evaluated however the origin of the femoral-popliteal bypass graft is patent  Left Lower Extremity: The left common femoral profundofemoral artery are widely patent but calcified.  There is a femoral to above-knee popliteal bypass graft that is patent.  There is luminal irregularity secondary to exophytic calcific plaque at the adductor canal.  The anastomosis to the above-knee popliteal artery appears to be patent.  The popliteal artery is calcified but patent throughout.  The peroneal artery occludes beyond its origin.  There is reconstitution approximately 10 cm later and poor opacification of the foot  Intervention: After the above images were acquired, I felt that for limb salvage we would try to proceed with intervention.  Over a Rosen wire, a 6 French 65 cm sheath was advanced into the femoral-popliteal bypass graft over a Rosen wire.  The patient was bolused with heparin.  Her ACT was greater than 350.  Next, using a three 2.5 x 100 Sterling balloon as a support catheter and a V-18 wire, I tried to proceed with recanalization of the occluded peroneal artery.  Multiple attempts were unsuccessful and ultimately terminated.  Since that had a balloon across the anastomosis and I had had some difficulty getting a wire to travel through the anastomosis to the above-knee popliteal artery, I like to  perform balloon angioplasty of the distal anastomosis with a 2.5 mm balloon.  This was done at 18 atm for 2 minutes.  I then performed angioplasty with this balloon across the areas of exophytic plaque for 30 seconds.  Completion imaging showed that these portions of the bypass graft remain widely patent.  The long  6 French sheath was exchanged out for a short 6 [place] sheath and a minx device was used for closure.  Impression:  #1  Heavily calcified aortoiliac artery without significant stenosis.  #2  Patent bilateral femoral to above-knee popliteal artery bypass grafts  #3  Progression of tibial disease on the left.  There is now occlusion of the peroneal artery which was the single-vessel runoff.  This was not able to be recanalized.  The distal anastomosis to the bypass graft was treated with a 2.5 mm balloon.  #4  No options for revascularization.  She had the wound progressed, she would need amputation.    Theotis Burrow, M.D., [facility] [place] Office: [phone] Pager:  [phone]

## 2019-02-04 ENCOUNTER — Encounter (HOSPITAL_COMMUNITY): Payer: Self-pay | Admitting: Surgery

## 2019-02-04 DIAGNOSIS — E11621 Type 2 diabetes mellitus with foot ulcer: Secondary | ICD-10-CM | POA: Diagnosis not present

## 2019-02-04 LAB — BASIC METABOLIC PANEL
Anion gap: 10 (ref 5–15)
BUN: 56 mg/dL — ABNORMAL HIGH (ref 8–23)
CO2: 19 mmol/L — ABNORMAL LOW (ref 22–32)
Calcium: 8.1 mg/dL — ABNORMAL LOW (ref 8.9–10.3)
Chloride: 112 mmol/L — ABNORMAL HIGH (ref 98–111)
Creatinine, Ser: 1.68 mg/dL — ABNORMAL HIGH (ref 0.44–1.00)
GFR calc Af Amer: 31 mL/min — ABNORMAL LOW (ref >60)
GFR calc non Af Amer: 27 mL/min — ABNORMAL LOW (ref >60)
Glucose, Bld: 106 mg/dL — ABNORMAL HIGH (ref 70–99)
Potassium: 4.1 mmol/L (ref 3.5–5.1)
Sodium: 141 mmol/L (ref 135–145)

## 2019-02-04 LAB — CBC
HCT: 21.6 % — ABNORMAL LOW (ref 36.0–46.0)
Hemoglobin: 6.8 g/dL — CL (ref 12.0–15.0)
MCH: 28.7 pg (ref 26.0–34.0)
MCHC: 31.5 g/dL (ref 30.0–36.0)
MCV: 91.1 fL (ref 80.0–100.0)
Platelets: 193 10*3/uL (ref 150–400)
RBC: 2.37 MIL/uL — ABNORMAL LOW (ref 3.87–5.11)
RDW: 16.4 % — ABNORMAL HIGH (ref 11.5–15.5)
WBC: 4.1 10*3/uL (ref 4.0–10.5)
nRBC: 0 % (ref 0.0–0.2)

## 2019-02-04 LAB — PREPARE RBC (CROSSMATCH)

## 2019-02-04 MED ORDER — ASPIRIN 81 MG PO TBEC: 81.0000 mg | DELAYED_RELEASE_TABLET | Freq: Every day | ORAL | Status: DC

## 2019-02-04 NOTE — Progress Notes (Addendum)
  Progress Note    02/04/2019 7:28 AM 1 Day Post-Op   Subjective:  No complaints this morning.  Aware of risk for amputation if L foot wound worsens.   Vitals:   02/04/19 0309 02/04/19 0612  BP: (!) 134/41   Pulse: 74   Resp: (!) 23   Temp: 99.1 F (37.3 C) 97.7 F (36.5 C)  SpO2: 93%    Physical Exam: Lungs:  Non labored Incisions:  R groin cath site without palpable hematoma; L 4th toe wound Neurologic: A&O  CBC    Component Value Date/Time   WBC 4.1 02/04/2019 0614   RBC 2.37 (L) 02/04/2019 0614   HGB 6.8 (LL) 02/04/2019 0614   HGB 9.1 (L) 10/20/2018 1043   HCT 21.6 (L) 02/04/2019 0614   HCT 28.6 (L) 10/20/2018 1043   PLT 193 02/04/2019 0614   PLT 244 10/20/2018 1043   MCV 91.1 02/04/2019 0614   MCV 90 10/20/2018 1043   MCH 28.7 02/04/2019 0614   MCHC 31.5 02/04/2019 0614   RDW 16.4 (H) 02/04/2019 0614   RDW 13.0 10/20/2018 1043   LYMPHSABS 0.9 01/17/2019 0501   LYMPHSABS 0.9 10/20/2018 1043   MONOABS 0.4 01/17/2019 0501   EOSABS 0.1 01/17/2019 0501   EOSABS 0.1 10/20/2018 1043   BASOSABS 0.0 01/17/2019 0501   BASOSABS 0.1 10/20/2018 1043    BMET    Component Value Date/Time   NA 141 02/04/2019 0614   NA 142 10/12/2018 1055   K 4.1 02/04/2019 0614   CL 112 (H) 02/04/2019 0614   CO2 19 (L) 02/04/2019 0614   GLUCOSE 106 (H) 02/04/2019 0614   BUN 56 (H) 02/04/2019 0614   BUN 41 (H) 10/12/2018 1055   CREATININE 1.68 (H) 02/04/2019 0614   CREATININE 0.96 04/13/2015 1009   CALCIUM 8.1 (L) 02/04/2019 0614   GFRNONAA 27 (L) 02/04/2019 0614   GFRNONAA 55 (L) 04/13/2015 1009   GFRAA 31 (L) 02/04/2019 0614   GFRAA 63 04/13/2015 1009    INR    Component Value Date/Time   INR 1.1 01/14/2019 2010     Intake/Output Summary (Last 24 hours) at 02/04/2019 0728 Last data filed at 02/04/2019 0600 Gross per 24 hour  Intake 386.33 ml  Output 275 ml  Net 111.33 ml     Assessment/Plan:  85 y.o. female is s/p LLE arteriogram with angioplasty of bypass,  unable to cross occluded peroneal 1 Day Post-Op   No options for revascularization based on arteriogram Patient will follow up with Dr. Sharol Given for amputation if wound worsens Chronic anemia: asymptomatic at rest; will discuss transfusion with attending Possible discharge this afternoon  Dagoberto Ligas, PA-C [facility] [phone] 02/04/2019 7:28 AM  I have seen and evaluated the patient. I agree with the PA note as documented above. POD#1 LLE angio with non-reconstructable disease.  Right groin looks good.  Will transfuse 1 unit prior to discharge - Hgb 7.8 --> 6.8 -- chronic anemia.  No concern for groin bleed.  She will be followed by Dr. Sharol Given.    Marty Heck, MD [facility] [place] Office: [phone] Pager: [phone]

## 2019-02-04 NOTE — Discharge Instructions (Signed)
° °  [facility] [place] ° °Discharge Instructions ° °Lower Extremity Angiogram; Angioplasty/Stenting ° °Please refer to the following instructions for your post-procedure care. Your surgeon or physician assistant will discuss any changes with you. ° °Activity ° °Avoid lifting more than 8 pounds (1 gallons of milk) for 72 hours (3 days) after your procedure. You may walk as much as you can tolerate. It's OK to drive after 72 hours. ° °Bathing/Showering ° °You may shower the day after your procedure. If you have a bandage, you may remove it at 24- 48 hours. Clean your incision site with mild soap and water. Pat the area dry with a clean towel. ° °Diet ° °Resume your pre-procedure diet. There are no special food restrictions following this procedure. All patients with peripheral vascular disease should follow a low fat/low cholesterol diet. In order to heal from your surgery, it is CRITICAL to get adequate nutrition. Your body requires vitamins, minerals, and protein. Vegetables are the best source of vitamins and minerals. Vegetables also provide the perfect balance of protein. Processed food has little nutritional value, so try to avoid this. ° °Medications ° °Resume taking all of your medications unless your doctor tells you not to. If your incision is causing pain, you may take over-the-counter pain relievers such as acetaminophen (Tylenol) ° °Follow Up ° °Follow up will be arranged at the time of your procedure. You may have an office visit scheduled or may be scheduled for surgery. Ask your surgeon if you have any questions. ° °Please call us immediately for any of the following conditions: °•Severe or worsening pain your legs or feet at rest or with walking. °•Increased pain, redness, drainage at your groin puncture site. °•Fever of 101 degrees or higher. °•If you have any mild or slow bleeding from your puncture site: lie down, apply firm constant pressure over the area with a piece of  gauze or a clean wash cloth for 30 minutes- no peeking!, call 911 right away if you are still bleeding after 30 minutes, or if the bleeding is heavy and unmanageable. ° °Reduce your risk factors of vascular disease: ° °Stop smoking. If you would like help call [facility] at 1-800-QUIT-NOW (1-800-784-8669) or [facility] at 336-586-4000. °Manage your cholesterol °Maintain a desired weight °Control your diabetes °Keep your blood pressure down ° °If you have any questions, please call the office at [phone] ° °

## 2019-02-04 NOTE — Progress Notes (Addendum)
CRITICAL VALUE ALERT  Critical Value:  Hgb 6.8  Date & Time Notied:  02/04/19 @ 0700  Provider Notified: MD on call Donzetta Matters   Orders Received/Actions taken: awaiting orders  Lajoyce Corners, RN

## 2019-02-05 LAB — BPAM RBC
Blood Product Expiration Date: 202005112359
ISSUE DATE / TIME: 202005071233
Unit Type and Rh: 9500

## 2019-02-05 LAB — TYPE AND SCREEN
ABO/RH(D): O POS
Antibody Screen: NEGATIVE
Unit division: 0

## 2019-02-12 ENCOUNTER — Telehealth: Payer: Self-pay | Admitting: Physician Assistant

## 2019-02-12 NOTE — Telephone Encounter (Signed)
Absolutely.

## 2019-02-12 NOTE — Telephone Encounter (Signed)
Mickel Baas  with med assist called for verbal orders for  twice a week for 4 weeks for physical therapy.

## 2019-02-12 NOTE — Telephone Encounter (Signed)
Mickel Baas, PT informed of V.O.

## 2019-02-17 ENCOUNTER — Other Ambulatory Visit: Payer: Self-pay | Admitting: Pharmacist

## 2019-02-17 DIAGNOSIS — I10 Essential (primary) hypertension: Secondary | ICD-10-CM

## 2019-02-17 MED ORDER — AMLODIPINE BESYLATE 10 MG PO TABS: 10.0000 mg | ORAL_TABLET | Freq: Every day | ORAL | 1 refills | Status: DC

## 2019-03-22 ENCOUNTER — Other Ambulatory Visit: Payer: Self-pay | Admitting: Primary Care

## 2019-03-22 ENCOUNTER — Other Ambulatory Visit (INDEPENDENT_AMBULATORY_CARE_PROVIDER_SITE_OTHER): Payer: Self-pay | Admitting: Family Medicine

## 2019-03-22 DIAGNOSIS — I739 Peripheral vascular disease, unspecified: Secondary | ICD-10-CM

## 2019-03-22 DIAGNOSIS — I10 Essential (primary) hypertension: Secondary | ICD-10-CM

## 2019-03-26 ENCOUNTER — Observation Stay (HOSPITAL_COMMUNITY)
Admission: EM | Admit: 2019-03-26 | Discharge: 2019-03-28 | Disposition: A | Discharge status: Home / Self Care | Payer: Medicare Other | Attending: Internal Medicine | Admitting: Internal Medicine

## 2019-03-26 ENCOUNTER — Other Ambulatory Visit: Payer: Self-pay

## 2019-03-26 ENCOUNTER — Emergency Department (HOSPITAL_COMMUNITY): Payer: Medicare Other

## 2019-03-26 DIAGNOSIS — Z03818 Encounter for observation for suspected exposure to other biological agents ruled out: Secondary | ICD-10-CM | POA: Diagnosis not present

## 2019-03-26 DIAGNOSIS — I5032 Chronic diastolic (congestive) heart failure: Secondary | ICD-10-CM | POA: Diagnosis present

## 2019-03-26 DIAGNOSIS — G459 Transient cerebral ischemic attack, unspecified: Secondary | ICD-10-CM | POA: Diagnosis present

## 2019-03-26 DIAGNOSIS — Z87891 Personal history of nicotine dependence: Secondary | ICD-10-CM | POA: Diagnosis not present

## 2019-03-26 DIAGNOSIS — I130 Hypertensive heart and chronic kidney disease with heart failure and stage 1 through stage 4 chronic kidney disease, or unspecified chronic kidney disease: Secondary | ICD-10-CM | POA: Insufficient documentation

## 2019-03-26 DIAGNOSIS — Z853 Personal history of malignant neoplasm of breast: Secondary | ICD-10-CM | POA: Insufficient documentation

## 2019-03-26 DIAGNOSIS — I739 Peripheral vascular disease, unspecified: Secondary | ICD-10-CM | POA: Diagnosis present

## 2019-03-26 DIAGNOSIS — Z79899 Other long term (current) drug therapy: Secondary | ICD-10-CM | POA: Diagnosis not present

## 2019-03-26 DIAGNOSIS — J90 Pleural effusion, not elsewhere classified: Secondary | ICD-10-CM

## 2019-03-26 DIAGNOSIS — E119 Type 2 diabetes mellitus without complications: Secondary | ICD-10-CM

## 2019-03-26 DIAGNOSIS — R42 Dizziness and giddiness: Secondary | ICD-10-CM | POA: Diagnosis present

## 2019-03-26 DIAGNOSIS — Z8673 Personal history of transient ischemic attack (TIA), and cerebral infarction without residual deficits: Secondary | ICD-10-CM | POA: Insufficient documentation

## 2019-03-26 DIAGNOSIS — E1122 Type 2 diabetes mellitus with diabetic chronic kidney disease: Secondary | ICD-10-CM | POA: Diagnosis not present

## 2019-03-26 DIAGNOSIS — N183 Chronic kidney disease, stage 3 unspecified: Secondary | ICD-10-CM | POA: Diagnosis present

## 2019-03-26 DIAGNOSIS — G3184 Mild cognitive impairment, so stated: Secondary | ICD-10-CM | POA: Insufficient documentation

## 2019-03-26 DIAGNOSIS — E785 Hyperlipidemia, unspecified: Secondary | ICD-10-CM | POA: Diagnosis present

## 2019-03-26 DIAGNOSIS — I10 Essential (primary) hypertension: Secondary | ICD-10-CM | POA: Diagnosis present

## 2019-03-26 LAB — URINALYSIS, ROUTINE W REFLEX MICROSCOPIC
Bacteria, UA: NONE SEEN
Bilirubin Urine: NEGATIVE
Glucose, UA: NEGATIVE mg/dL
Ketones, ur: NEGATIVE mg/dL
Leukocytes,Ua: NEGATIVE
Nitrite: NEGATIVE
Protein, ur: 100 mg/dL — AB
Specific Gravity, Urine: 1.011 (ref 1.005–1.030)
pH: 5 (ref 5.0–8.0)

## 2019-03-26 LAB — COMPREHENSIVE METABOLIC PANEL
ALT: 14 U/L (ref 0–44)
AST: 17 U/L (ref 15–41)
Albumin: 3.5 g/dL (ref 3.5–5.0)
Alkaline Phosphatase: 57 U/L (ref 38–126)
Anion gap: 6 (ref 5–15)
BUN: 44 mg/dL — ABNORMAL HIGH (ref 8–23)
CO2: 17 mmol/L — ABNORMAL LOW (ref 22–32)
Calcium: 8.7 mg/dL — ABNORMAL LOW (ref 8.9–10.3)
Chloride: 115 mmol/L — ABNORMAL HIGH (ref 98–111)
Creatinine, Ser: 1.4 mg/dL — ABNORMAL HIGH (ref 0.44–1.00)
GFR calc Af Amer: 39 mL/min — ABNORMAL LOW (ref >60)
GFR calc non Af Amer: 34 mL/min — ABNORMAL LOW (ref >60)
Glucose, Bld: 139 mg/dL — ABNORMAL HIGH (ref 70–99)
Potassium: 4.6 mmol/L (ref 3.5–5.1)
Sodium: 138 mmol/L (ref 135–145)
Total Bilirubin: 0.7 mg/dL (ref 0.3–1.2)
Total Protein: 6.9 g/dL (ref 6.5–8.1)

## 2019-03-26 LAB — PROTIME-INR
INR: 1.2 (ref 0.8–1.2)
Prothrombin Time: 14.6 seconds (ref 11.4–15.2)

## 2019-03-26 LAB — DIFFERENTIAL
Abs Immature Granulocytes: 0.02 10*3/uL (ref 0.00–0.07)
Basophils Absolute: 0 10*3/uL (ref 0.0–0.1)
Basophils Relative: 1 %
Eosinophils Absolute: 0.1 10*3/uL (ref 0.0–0.5)
Eosinophils Relative: 2 %
Immature Granulocytes: 0 %
Lymphocytes Relative: 22 %
Lymphs Abs: 1.2 10*3/uL (ref 0.7–4.0)
Monocytes Absolute: 0.3 10*3/uL (ref 0.1–1.0)
Monocytes Relative: 5 %
Neutro Abs: 3.7 10*3/uL (ref 1.7–7.7)
Neutrophils Relative %: 70 %

## 2019-03-26 LAB — CBC
HCT: 31.1 % — ABNORMAL LOW (ref 36.0–46.0)
Hemoglobin: 9.7 g/dL — ABNORMAL LOW (ref 12.0–15.0)
MCH: 31.2 pg (ref 26.0–34.0)
MCHC: 31.2 g/dL (ref 30.0–36.0)
MCV: 100 fL (ref 80.0–100.0)
Platelets: 223 10*3/uL (ref 150–400)
RBC: 3.11 MIL/uL — ABNORMAL LOW (ref 3.87–5.11)
RDW: 16.3 % — ABNORMAL HIGH (ref 11.5–15.5)
WBC: 5.3 10*3/uL (ref 4.0–10.5)
nRBC: 0 % (ref 0.0–0.2)

## 2019-03-26 LAB — APTT: aPTT: 34 seconds (ref 24–36)

## 2019-03-26 MED ORDER — SODIUM CHLORIDE 0.9 % IV BOLUS
500.0000 mL | Freq: Once | INTRAVENOUS | Status: AC
  Administered 2019-03-26: 500 mL via INTRAVENOUS

## 2019-03-26 MED ORDER — SODIUM CHLORIDE 0.9% FLUSH: 10.0000 mL | INTRAVENOUS | Status: DC | PRN
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
## 2019-03-26 NOTE — ED Notes (Signed)
[name] (niece) updated on patient condition

## 2019-03-26 NOTE — ED Notes (Signed)
O2 dropped to 91% while ambulating with pulse ox. Pt complains of dizziness.

## 2019-03-26 NOTE — ED Triage Notes (Signed)
Family called EMS d/t dizziness. c/o of feeling lightheaded and nausea. Pt lives alone, called family because she was unable to get off couch.

## 2019-03-26 NOTE — ED Notes (Signed)
NieceAnne Ng, [phone] for updates, will pick up if pt gets discharged

## 2019-03-26 NOTE — ED Notes (Signed)
Pt states she is dizzy and does not feel safe to ambulate. Unable to ambulate with pulse ox.

## 2019-03-27 ENCOUNTER — Other Ambulatory Visit (HOSPITAL_COMMUNITY): Payer: Medicare Other

## 2019-03-27 ENCOUNTER — Encounter (HOSPITAL_COMMUNITY): Payer: Self-pay

## 2019-03-27 ENCOUNTER — Observation Stay (HOSPITAL_COMMUNITY): Payer: Medicare Other

## 2019-03-27 DIAGNOSIS — G459 Transient cerebral ischemic attack, unspecified: Secondary | ICD-10-CM

## 2019-03-27 DIAGNOSIS — R42 Dizziness and giddiness: Secondary | ICD-10-CM | POA: Diagnosis not present

## 2019-03-27 LAB — LIPID PANEL
Cholesterol: 178 mg/dL (ref 0–200)
HDL: 77 mg/dL (ref >40)
LDL Cholesterol: 90 mg/dL (ref 0–99)
Total CHOL/HDL Ratio: 2.3 RATIO
Triglycerides: 53 mg/dL (ref <150)
VLDL: 11 mg/dL (ref 0–40)

## 2019-03-27 LAB — GLUCOSE, CAPILLARY
Glucose-Capillary: 124 mg/dL — ABNORMAL HIGH (ref 70–99)
Glucose-Capillary: 108 mg/dL — ABNORMAL HIGH (ref 70–99)
Glucose-Capillary: 142 mg/dL — ABNORMAL HIGH (ref 70–99)
Glucose-Capillary: 112 mg/dL — ABNORMAL HIGH (ref 70–99)

## 2019-03-27 LAB — RAPID URINE DRUG SCREEN, HOSP PERFORMED
Amphetamines: NOT DETECTED
Barbiturates: NOT DETECTED
Benzodiazepines: NOT DETECTED
Cocaine: NOT DETECTED
Opiates: NOT DETECTED
Tetrahydrocannabinol: NOT DETECTED

## 2019-03-27 LAB — HEMOGLOBIN A1C
Hgb A1c MFr Bld: 5.5 % (ref 4.8–5.6)
Mean Plasma Glucose: 111.15 mg/dL

## 2019-03-27 LAB — TSH: TSH: 18.283 u[IU]/mL — ABNORMAL HIGH (ref 0.350–4.500)

## 2019-03-27 LAB — VITAMIN B12: Vitamin B-12: 839 pg/mL (ref 180–914)

## 2019-03-27 MED ORDER — LATANOPROST 0.005 % OP SOLN
1.0000 [drp] | Freq: Every day | OPHTHALMIC | Status: DC
  Administered 2019-03-27 (×2): 1 [drp] via OPHTHALMIC
  Filled 2019-03-27: qty 2.5

## 2019-03-27 MED ORDER — ENOXAPARIN SODIUM 30 MG/0.3ML ~~LOC~~ SOLN
30.0000 mg | SUBCUTANEOUS | Status: DC
  Administered 2019-03-27 – 2019-03-28 (×2): 30 mg via SUBCUTANEOUS
  Filled 2019-03-27: qty 0.3

## 2019-03-27 MED ORDER — AMLODIPINE BESYLATE 10 MG PO TABS
10.0000 mg | ORAL_TABLET | Freq: Every day | ORAL | Status: DC
  Administered 2019-03-27 – 2019-03-28 (×2): 10 mg via ORAL
  Filled 2019-03-27 (×2): qty 1

## 2019-03-27 MED ORDER — HYDRALAZINE HCL 25 MG PO TABS
25.0000 mg | ORAL_TABLET | Freq: Three times a day (TID) | ORAL | Status: DC
  Administered 2019-03-27 – 2019-03-28 (×5): 25 mg via ORAL
  Filled 2019-03-27 (×5): qty 1

## 2019-03-27 MED ORDER — ASPIRIN 300 MG RE SUPP: 300.0000 mg | Freq: Every day | RECTAL | Status: DC

## 2019-03-27 MED ORDER — MIRTAZAPINE 15 MG PO TABS
7.5000 mg | ORAL_TABLET | Freq: Every day | ORAL | Status: DC
  Administered 2019-03-27 (×2): 7.5 mg via ORAL
  Filled 2019-03-27 (×2): qty 1

## 2019-03-27 MED ORDER — SENNOSIDES-DOCUSATE SODIUM 8.6-50 MG PO TABS: 1.0000 | ORAL_TABLET | Freq: Every evening | ORAL | Status: DC | PRN

## 2019-03-27 MED ORDER — BOOST PO LIQD: 237.0000 mL | Freq: Two times a day (BID) | ORAL | Status: DC

## 2019-03-27 MED ORDER — VITAMIN B-12 1000 MCG PO TABS
1000.0000 ug | ORAL_TABLET | Freq: Every day | ORAL | Status: DC
  Administered 2019-03-27 – 2019-03-28 (×2): 1000 ug via ORAL
  Filled 2019-03-27 (×2): qty 1

## 2019-03-27 MED ORDER — ACETAMINOPHEN 325 MG PO TABS: 650.0000 mg | ORAL_TABLET | ORAL | Status: DC | PRN

## 2019-03-27 MED ORDER — HYDROCHLOROTHIAZIDE 25 MG PO TABS: 25.0000 mg | ORAL_TABLET | Freq: Every day | ORAL | Status: DC

## 2019-03-27 MED ORDER — LEVOTHYROXINE SODIUM 88 MCG PO TABS
88.0000 ug | ORAL_TABLET | Freq: Every day | ORAL | Status: DC
  Administered 2019-03-27: 88 ug via ORAL
  Filled 2019-03-27 (×2): qty 1

## 2019-03-27 MED ORDER — ASPIRIN 81 MG PO TBEC: 81.0000 mg | DELAYED_RELEASE_TABLET | Freq: Every day | ORAL | Status: DC

## 2019-03-27 MED ORDER — ATORVASTATIN CALCIUM 10 MG PO TABS
20.0000 mg | ORAL_TABLET | Freq: Every day | ORAL | Status: DC
  Administered 2019-03-27 – 2019-03-28 (×2): 20 mg via ORAL
  Filled 2019-03-27 (×3): qty 2

## 2019-03-27 MED ORDER — ADULT MULTIVITAMIN W/MINERALS CH
1.0000 | ORAL_TABLET | Freq: Every day | ORAL | Status: DC
  Administered 2019-03-27 – 2019-03-28 (×2): 1 via ORAL
  Filled 2019-03-27 (×2): qty 1

## 2019-03-27 MED ORDER — INSULIN ASPART 100 UNIT/ML ~~LOC~~ SOLN: 0.0000 [IU] | Freq: Every day | SUBCUTANEOUS | Status: DC

## 2019-03-27 MED ORDER — BRIMONIDINE TARTRATE 0.2 % OP SOLN
1.0000 [drp] | Freq: Two times a day (BID) | OPHTHALMIC | Status: DC
  Administered 2019-03-27 – 2019-03-28 (×4): 1 [drp] via OPHTHALMIC
  Filled 2019-03-27: qty 5

## 2019-03-27 MED ORDER — FUROSEMIDE 40 MG PO TABS
40.0000 mg | ORAL_TABLET | Freq: Every day | ORAL | Status: DC
  Administered 2019-03-27: 40 mg via ORAL
  Filled 2019-03-27: qty 1

## 2019-03-27 MED ORDER — LORAZEPAM 2 MG/ML IJ SOLN: 0.5000 mg | Freq: Once | INTRAMUSCULAR | Status: DC

## 2019-03-27 MED ORDER — ACETAMINOPHEN 650 MG RE SUPP: 650.0000 mg | RECTAL | Status: DC | PRN

## 2019-03-27 MED ORDER — LINACLOTIDE 145 MCG PO CAPS: 145.0000 ug | ORAL_CAPSULE | Freq: Every day | ORAL | Status: DC

## 2019-03-27 MED ORDER — NETARSUDIL DIMESYLATE 0.02 % OP SOLN: 1.0000 [drp] | Freq: Every day | OPHTHALMIC | Status: DC

## 2019-03-27 MED ORDER — CHOLECALCIFEROL 10 MCG (400 UNIT) PO TABS
400.0000 [IU] | ORAL_TABLET | Freq: Every day | ORAL | Status: DC
  Administered 2019-03-27 – 2019-03-28 (×2): 400 [IU] via ORAL
  Filled 2019-03-27 (×2): qty 1

## 2019-03-27 MED ORDER — ACETAMINOPHEN 160 MG/5ML PO SOLN: 650.0000 mg | ORAL | Status: DC | PRN

## 2019-03-27 MED ORDER — STROKE: EARLY STAGES OF RECOVERY BOOK
Freq: Once | Status: AC
  Administered 2019-03-27: 02:00:00
  Filled 2019-03-27: qty 1

## 2019-03-27 MED ORDER — ASPIRIN 325 MG PO TABS
325.0000 mg | ORAL_TABLET | Freq: Every day | ORAL | Status: DC
  Administered 2019-03-27 – 2019-03-28 (×2): 325 mg via ORAL
  Filled 2019-03-27 (×2): qty 1

## 2019-03-27 MED ORDER — ENSURE ENLIVE PO LIQD
237.0000 mL | Freq: Two times a day (BID) | ORAL | Status: DC
  Administered 2019-03-27 – 2019-03-28 (×4): 237 mL via ORAL

## 2019-03-27 MED ORDER — FERROUS SULFATE 325 (65 FE) MG PO TABS
325.0000 mg | ORAL_TABLET | Freq: Every day | ORAL | Status: DC
  Administered 2019-03-27 – 2019-03-28 (×2): 325 mg via ORAL
  Filled 2019-03-27 (×2): qty 1

## 2019-03-27 MED ORDER — SENNA 8.6 MG PO TABS: 1.0000 | ORAL_TABLET | Freq: Every day | ORAL | Status: DC | PRN

## 2019-03-27 MED ORDER — SODIUM CHLORIDE 0.9 % IV SOLN
INTRAVENOUS | Status: DC
  Administered 2019-03-27: 02:00:00 via INTRAVENOUS

## 2019-03-27 MED ORDER — INSULIN ASPART 100 UNIT/ML ~~LOC~~ SOLN: 0.0000 [IU] | Freq: Three times a day (TID) | SUBCUTANEOUS | Status: DC

## 2019-03-27 MED ORDER — CALCIUM CARBONATE 1250 (500 CA) MG PO TABS
500.0000 mg | ORAL_TABLET | Freq: Every day | ORAL | Status: DC
  Administered 2019-03-27 – 2019-03-28 (×2): 500 mg via ORAL
  Filled 2019-03-27 (×2): qty 1
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
## 2019-03-27 NOTE — Progress Notes (Signed)
Pt answered nuero questions correctly. Knows she is at [facility], knows it is June 2020, and is able to state her date of birth  Admission database collected at bedside  Pt is forgetful at times but can be redirected  Will call pt family to verify received information

## 2019-03-27 NOTE — Evaluation (Signed)
Physical Therapy Evaluation Patient Details Name: Gina [name] MRN: [id number] DOB: 1929/03/10 Today's Date: 03/27/2019   History of Present Illness  85 year old chronically ill female with hypertension, CVA, dyslipidemia, peripheral vascular disease, type 2 diabetes mellitus, stage III kidney disease, chronic diastolic CHF, history of intermittent dizziness, also mild memory loss, presented to the ED last night with dizziness and nausea    Clinical Impression  Pt admitted with above diagnosis. Pt currently with functional limitations due to the deficits listed below (see PT Problem List). PTA, pt residing at ALF,pt states ambulating with [facility]. Today she states she feels stronger than yesterday, ambulating in room with RW before quickly fatiguing. Due to cognitive deficits and safety awareness, rec SNF as she is likely unsupervised at ALF.  Pt will benefit from skilled PT to increase their independence and safety with mobility to allow discharge to the venue listed below.       Follow Up Recommendations SNF;Supervision/Assistance - 24 hour    Equipment Recommendations  (TBD next venue)    Recommendations for Other Services       Precautions / Restrictions Precautions Precautions: Fall Restrictions Weight Bearing Restrictions: No      Mobility  Bed Mobility Overal bed mobility: Modified Independent                Transfers Overall transfer level: Needs assistance   Transfers: Sit to/from Stand Sit to Stand: Min guard            Ambulation/Gait Ambulation/Gait assistance: Min guard Gait Distance (Feet): 30 Feet Assistive device: Rolling walker (2 wheeled) Gait Pattern/deviations: Step-to pattern Gait velocity: decraesed   General Gait Details: mild unsteady, assist with RW for safety aroud objects  Stairs            Wheelchair Mobility    Modified Rankin (Stroke Patients Only)       Balance Overall balance assessment: Needs assistance    Sitting balance-Leahy Scale: Fair       Standing balance-Leahy Scale: Poor                               Pertinent Vitals/Pain Pain Assessment: No/denies pain    Home Living Family/patient expects to be discharged to:: Assisted living                      Prior Function Level of Independence: Needs assistance   Gait / Transfers Assistance Needed: Uses SPC for ambulation. Does her own dressing. Assist with bathing from an aide. Reports multiple falls at home.           Hand Dominance        Extremity/Trunk Assessment   Upper Extremity Assessment Upper Extremity Assessment: Defer to OT evaluation    Lower Extremity Assessment Lower Extremity Assessment: Generalized weakness(LLE weaker than RLE, per chart chronic weakness)       Communication      Cognition Arousal/Alertness: Awake/alert Behavior During Therapy: WFL for tasks assessed/performed Overall Cognitive Status: No family/caregiver present to determine baseline cognitive functioning                                 General Comments: AOx4, but at times with inappropriate responses in conversations       General Comments      Exercises     Assessment/Plan    PT Assessment Patient  needs continued PT services  PT Problem List Decreased strength       PT Treatment Interventions Gait training;DME instruction;Functional mobility training;Therapeutic activities;Therapeutic exercise;Balance training    PT Goals (Current goals can be found in the Care Plan section)  Acute Rehab PT Goals Patient Stated Goal: non stated PT Goal Formulation: With patient Time For Goal Achievement: 04/10/19 Potential to Achieve Goals: Good    Frequency Min 4X/week   Barriers to discharge Decreased caregiver support      Co-evaluation               AM-PAC PT "6 Clicks" Mobility  Outcome Measure Help needed turning from your back to your side while in a flat bed without  using bedrails?: A Little Help needed moving from lying on your back to sitting on the side of a flat bed without using bedrails?: A Little Help needed moving to and from a bed to a chair (including a wheelchair)?: A Little Help needed standing up from a chair using your arms (e.g., wheelchair or bedside chair)?: A Little Help needed to walk in hospital room?: A Lot Help needed climbing 3-5 steps with a railing? : A Lot 6 Click Score: 16    End of Session Equipment Utilized During Treatment: Gait belt Activity Tolerance: Patient tolerated treatment well Patient left: in bed;with call bell/phone within reach;with chair alarm set Nurse Communication: Mobility status PT Visit Diagnosis: Unsteadiness on feet (R26.81)    Time: 5176-1607 PT Time Calculation (min) (ACUTE ONLY): 18 min   Charges:   PT Evaluation $PT Eval High Complexity: 1 High          Reinaldo Berber, PT, DPT Acute Rehabilitation Services Pager: [phone] Office: [phone]    Reinaldo Berber 03/27/2019, 2:45 PM
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
## 2019-03-27 NOTE — ED Notes (Signed)
Attempted to call report

## 2019-03-27 NOTE — Consult Note (Signed)
Neurology Consultation Reason for Consult: Dizziness Referring Physician: Jonelle Sidle, M  CC: Dizziness  History is obtained from: Patient  HPI: Gina [name] is a 85 y.o. female with a history of recurrent episodes of dizziness happening at least once a month going back many years.  She states that usually when she gets feeling that she comes to the hospital, give her fluids and/or blood and she feels better.  She also is complaining of some left hip pain.  She has had problems with right-sided numbness since she had a stroke back in the 70s, as well as left-sided weakness which she states is been there for many years.  She laid down for a nap around 2 PM and woke up sometime shortly thereafter and was unable to stand up because when she tried to sit up, she had very "dizzy."  She describes the dizziness as both a sensation of lightheadedness as well as a sensation of room spinning.  It was very difficult to tease out exactly what she meant by "dizzy" but she did state that this was very similar to her previous presentations of dizziness.  Of note the patient is a very poor historian, and I am not absolutely certain of the reliability of this history.  Dr. Lavell Anchors saw her for cognitive decline in December of last year  She states that when she finally did stand up here, it was not the dizziness that prevented her from walking, but rather left hip pain.  She uses a walker at baseline.  LKW: Very much unclear tpa given?: no, unclear time of onset    ROS: A 14 point ROS was performed and is negative except as noted in the HPI.  Past Medical History:  Diagnosis Date  . Acute diastolic congestive heart failure ([place]) 01/28/2013  . AKI (acute kidney injury) ([place]) 10/24/2016  . Anemia 11/30/2014  . Arthritis    lt hip  . Depression   . Diabetes mellitus, type 2 ([place])    Well controlled  . Graves disease    HX OF GRAVES  . High cholesterol   . Hyperlipidemia   . Hypertension   . Intraductal  carcinoma 06/2003   Of right breast. s/p right partial mastectomy. // Followed by Dr. Marylene Buerger  . Mesenteric ischemia ([place])   . PVD (peripheral vascular disease) ([place])    S/P BL femoral-popliteal bypass surgery 03/2001 (right) and 01/2003 (left)  . TB lung, latent    Treated with INH in 11/2007  . Thyroid disease    Graves disease  . Transfusion history    last admission 11-30-14  . Ulcer    gastric antral ulcer and AVMs    Family History  Problem Relation Age of Onset  . Cancer Daughter 26       uterine cancer  . Cancer Father   . Dementia Neg Hx      Social History:  reports that she quit smoking about 14 months ago. Her smoking use included cigarettes. She has a 12.80 pack-year smoking history. She has never used smokeless tobacco. She reports that she does not drink alcohol or use drugs.   Exam: Current vital signs: BP (!) 195/50 (BP Location: Right Arm)   Pulse (!) 54   Temp 98.1 F (36.7 C) (Oral)   Resp 16   Ht 5\' 7"  (1.702 m)   Wt 54 kg   SpO2 100%   BMI 18.64 kg/m  Vital signs in last 24 hours: Temp:  [98.1 F (36.7  C)-98.2 F (36.8 C)] 98.1 F (36.7 C) (06/27 0103) Pulse Rate:  [49-63] 54 (06/27 0103) Resp:  [14-21] 16 (06/27 0103) BP: (164-200)/(46-123) 195/50 (06/27 0103) SpO2:  [94 %-100 %] 100 % (06/27 0103) Weight:  [54 kg] 54 kg (06/26 1849)   Physical Exam  Constitutional: Appears well-developed and well-nourished.  Psych: Affect appropriate to situation Eyes: No scleral injection HENT: No OP obstrucion Head: Normocephalic.  Cardiovascular: Normal rate and regular rhythm.  Respiratory: Effort normal, non-labored breathing GI: Soft.  No distension. There is no tenderness.  Skin: WDI  Neuro: Mental Status: Patient is awake, alert, oriented to person, place, month, year, and situation. Patient gives fairly circuitous answers No signs of aphasia or neglect Cranial Nerves: II: Visual Fields are full. Pupils are equal, round, and  reactive to light.  III,IV, VI: EOMI without ptosis or diploplia.  V: Facial sensation is diminished on the right VII: Facial movement with?  Left facial lag when smiling  VIII: hearing is intact to voice X: Uvula elevates symmetrically XI: Shoulder shrug is symmetric. XII: tongue is midline without atrophy or fasciculations.  Motor: Tone is normal. Bulk is normal. 5/5 strength was present in bilateral arms, her left leg is limited due to pain and hip flexion, good distal strength bilaterally Sensory: Sensation is diminished in the right leg to temperature, intact in the arm Cerebellar: She has mild tremor on finger-nose-finger bilaterally without past-pointing     I have reviewed labs in epic and the results pertinent to this consultation are: Hemoglobin 9.7(actually slightly higher than baseline)  I have reviewed the images obtained: CT head- chronic ischemic changes, certainly with the degree that she has a small old infarct could be hiding in there.  Impression: 85 year old female with dizziness and left leg pain.  The description sounds more consistent with orthostasis than vertigo, though her history is very unclear.  I do think an MRI would be reasonable, if this is negative, however, I do not think I would pursue a stroke work-up.  Her systolic dropped more than 20 points between sitting and standing, and her diastolic dropped from lying to sitting, there was a small increase in heart rate from the 50s to the 60s accompanying this.  My suspicion is that her symptoms are more reflective of orthostasis than stroke.  That being said, there is some numbness on my exam which was not documented previously by Dr. Jaynee Eagles.   Recommendations: 1) MRI brain 2) continue aspirin 3) agree with IV fluids   Roland Rack, MD [facility] [phone]  If 7pm- 7am, please page neurology on call as listed in [place].

## 2019-03-27 NOTE — Progress Notes (Addendum)
PROGRESS NOTE    Gina [name]  QQV:[id number] DOB: 1929-01-12 DOA: 03/26/2019 PCP: Clent Demark, PA-C  Brief Narrative: 85 year old chronically ill female with hypertension, CVA, dyslipidemia, peripheral vascular disease, type 2 diabetes mellitus, stage III kidney disease, chronic diastolic CHF, history of intermittent dizziness, also mild memory loss, presented to the ED last night with dizziness and nausea. -This reportedly happens occasionally sometimes once a month, yesterday she was trying to get out of her couch after a nap and was unable to stand up because she felt very dizzy, initially she is reported feeling a spinning sensation but subsequently said it was more like lightheadedness or blacking out.  Very poor historian   Assessment & Plan:   Dizziness -Unable to tease out if this is vertigo versus lightheadedness/presyncope, very poor historian, CT head with atrophy only, suspect some cognitive dysfunction/memory loss at baseline -She did have some drop in systolic blood pressure on standing, standing BP was 163/55 compared to 175/82 laying down, unclear if this is significant -Also home medicines list to diuretics namely HCTZ and Lasix she is unable to tell me if she is taking either of these -Hydrated, diuretics on hold -Was seen by neurology overnight, MRI recommended, orbital x-ray today prior to MRI, there was some concern overnight about patient mentioning some foreign body/metal in her eye -Physical therapy evaluation' -also check 2D ECHO, r/o valvular dz  Short-term memory loss/mild cognitive deficits -Check TSH, B12, PT OT evaluation  Hypertension -Continued regimen of hydralazine and amlodipine  Chronic diastolic CHF -Clinically euvolemic, diuretics on hold  Stage III kidney disease -Stable at baseline  Type 2 diabetes mellitus -On meds at home, sliding scale insulin for now, check hemoglobin A1c  Dyslipidemia -Continue statin  DVT prophylaxis:  Lovenox Code Status: Full code Family Communication: No family at bedside Disposition Plan: Home pending above work-up  Consultants:      Procedures:   Antimicrobials:    Subjective: -Feels okay today, did have some dizziness when she tried to sit up this morning  Objective: Vitals:   03/27/19 0103 03/27/19 0303 03/27/19 0503 03/27/19 0703  BP: (!) 195/50 (!) 159/39 (!) 164/46 (!) 175/47  Pulse: (!) 54     Resp: 16 15 16 16   Temp: 98.1 F (36.7 C) 98.2 F (36.8 C) 97.9 F (36.6 C) 98.2 F (36.8 C)  TempSrc: Oral Oral Oral Oral  SpO2: 100% 99% 98% 99%  Weight:      Height:        Intake/Output Summary (Last 24 hours) at 03/27/2019 1151 Last data filed at 03/27/2019 0830 Gross per 24 hour  Intake 360 ml  Output --  Net 360 ml   Filed Weights   03/26/19 1849  Weight: 54 kg    Examination:  General exam: Frail elderly chronically ill female, sitting up in bed, alert awake oriented to self and place only Respiratory system: Clear anteriorly Cardiovascular system: S1-S2/regular rate rhythm, diastolic murmur gastrointestinal system: Abdomen is nondistended, soft and nontender.Normal bowel sounds heard. Central nervous system: Alert and oriented x2.  Mild left leg weakness 4/5, -Per patient this is chronic  extremities: No edema Skin: No rashes, lesions or ulcers Psychiatry: Poor insight and judgment    Data Reviewed:   CBC: Recent Labs  Lab 03/26/19 2128  WBC 5.3  NEUTROABS 3.7  HGB 9.7*  HCT 31.1*  MCV 100.0  PLT 332   Basic Metabolic Panel: Recent Labs  Lab 03/26/19 2128  NA 138  K 4.6  CL  115*  CO2 17*  GLUCOSE 139*  BUN 44*  CREATININE 1.40*  CALCIUM 8.7*   GFR: Estimated Creatinine Clearance: 24.1 mL/min (A) (by C-G formula based on SCr of 1.4 mg/dL (H)). Liver Function Tests: Recent Labs  Lab 03/26/19 2128  AST 17  ALT 14  ALKPHOS 57  BILITOT 0.7  PROT 6.9  ALBUMIN 3.5   No results for input(s): LIPASE, AMYLASE in the  last 168 hours. No results for input(s): AMMONIA in the last 168 hours. Coagulation Profile: Recent Labs  Lab 03/26/19 2128  INR 1.2   Cardiac Enzymes: No results for input(s): CKTOTAL, CKMB, CKMBINDEX, TROPONINI in the last 168 hours. BNP (last 3 results) No results for input(s): PROBNP in the last 8760 hours. HbA1C: Recent Labs    03/27/19 0511  HGBA1C 5.5   CBG: Recent Labs  Lab 03/27/19 0825  GLUCAP 124*   Lipid Profile: Recent Labs    03/27/19 0511  CHOL 178  HDL 77  LDLCALC 90  TRIG 53  CHOLHDL 2.3   Thyroid Function Tests: No results for input(s): TSH, T4TOTAL, FREET4, T3FREE, THYROIDAB in the last 72 hours. Anemia Panel: No results for input(s): VITAMINB12, FOLATE, FERRITIN, TIBC, IRON, RETICCTPCT in the last 72 hours. Urine analysis:    Component Value Date/Time   COLORURINE YELLOW 03/26/2019 2253   APPEARANCEUR CLEAR 03/26/2019 2253   LABSPEC 1.011 03/26/2019 2253   PHURINE 5.0 03/26/2019 2253   GLUCOSEU NEGATIVE 03/26/2019 2253   GLUCOSEU NEG mg/dL 02/20/2009 2049   HGBUR SMALL (A) 03/26/2019 2253   BILIRUBINUR NEGATIVE 03/26/2019 2253   BILIRUBINUR neg 05/06/2018 1200   KETONESUR NEGATIVE 03/26/2019 2253   PROTEINUR 100 (A) 03/26/2019 2253   UROBILINOGEN 0.2 05/06/2018 1200   UROBILINOGEN 0.2 04/25/2014 1023   NITRITE NEGATIVE 03/26/2019 2253   LEUKOCYTESUR NEGATIVE 03/26/2019 2253   Sepsis Labs: @LABRCNTIP (procalcitonin:4,lacticidven:4)  )No results found for this or any previous visit (from the past 240 hour(s)).       Radiology Studies: Ct Head Wo Contrast  Result Date: 03/26/2019 CLINICAL DATA:  85 year old female with vertigo. EXAM: CT HEAD WITHOUT CONTRAST TECHNIQUE: Contiguous axial images were obtained from the base of the skull through the vertex without intravenous contrast. COMPARISON:  Head CT dated 02/07/2016 FINDINGS: Brain: There is mild age-related atrophy and moderate chronic microvascular ischemic changes. There is no  acute intracranial hemorrhage. No mass effect or midline shift. No extra-axial fluid collection. Vascular: No hyperdense vessel or unexpected calcification. Skull: Normal. Negative for fracture or focal lesion. Sinuses/Orbits: Left maxillary sinus retention cyst or polyp. The remainder of the visualized paranasal sinuses and mastoid air cells are clear. No air-fluid level. A 3 mm radiopaque foreign object over the right globe similar to prior CT. Other: None IMPRESSION: 1. No acute intracranial hemorrhage. 2. Age-related atrophy and chronic microvascular ischemic changes. Electronically Signed   By: Anner Crete M.D.   On: 03/26/2019 21:01        Scheduled Meds:  amLODipine  10 mg Oral Daily   aspirin  300 mg Rectal Daily   Or   aspirin  325 mg Oral Daily   atorvastatin  20 mg Oral q1800   brimonidine  1 drop Both Eyes BID   calcium carbonate  500 mg of elemental calcium Oral Daily   cholecalciferol  400 Units Oral Daily   enoxaparin (LOVENOX) injection  30 mg Subcutaneous Q24H   feeding supplement (ENSURE ENLIVE)  237 mL Oral BID BM   ferrous sulfate  325 mg  Oral Daily   furosemide  40 mg Oral Daily   hydrALAZINE  25 mg Oral TID   insulin aspart  0-5 Units Subcutaneous QHS   insulin aspart  0-9 Units Subcutaneous TID WC   latanoprost  1 drop Both Eyes QHS   levothyroxine  88 mcg Oral Q0600   LORazepam  0.5 mg Intravenous Once   mirtazapine  7.5 mg Oral QHS   multivitamin with minerals  1 tablet Oral Daily   Netarsudil Dimesylate  1 drop Both Eyes QHS   vitamin B-12  1,000 mcg Oral Daily   Continuous Infusions:   LOS: 0 days    Time spent: 52min    Domenic Polite, MD [facility] Page via [email or web address], password [email or web address] After 7PM please contact night-coverage  03/27/2019, 11:51 AM

## 2019-03-27 NOTE — Progress Notes (Signed)
Radiology called and stated there was metal present on the exam and cause for contraindication of MRI  Informed MD via message
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
## 2019-03-27 NOTE — Evaluation (Signed)
Occupational Therapy Evaluation Patient Details Name: Gina [name] MRN: [id number] DOB: 1928/10/18 Today's Date: 03/27/2019    History of Present Illness 85 year old chronically ill female with hypertension, CVA, dyslipidemia, peripheral vascular disease, type 2 diabetes mellitus, stage III kidney disease, chronic diastolic CHF, history of intermittent dizziness, also mild memory loss, presented to the ED last night with dizziness and nausea   Clinical Impression   This 85 yo female admitted with above presents to acute OT with decreased balance, reports multiple falls and decreased safety awareness thus affecting her safety and independence with basic ADLs.Feel pt would be best to go to SNF where she can get more intense therapy before turning home. We will continue to follow.    Follow Up Recommendations  No OT follow up;SNF;Supervision/Assistance - 24 hour;Other (comment)(if pt refuses SNF then HHOT, [facility], and recommend 24/7 S/prn A until family deems it safe for pt to be home alone intermittently)    Equipment Recommendations  None recommended by OT       Precautions / Restrictions Precautions Precautions: Fall Restrictions Weight Bearing Restrictions: No      Mobility Bed Mobility Overal bed mobility: Needs Assistance Bed Mobility: Supine to Sit;Sit to Supine     Supine to sit: Supervision Sit to supine: Min guard   General bed mobility comments: getting back into bed, pt put her RW off to the side and crawled into bed--gown was binding on her legs so not the safest  Transfers Overall transfer level: Needs assistance Equipment used: Rolling walker (2 wheeled) Transfers: Sit to/from Stand Sit to Stand: Min guard              Balance Overall balance assessment: Needs assistance Sitting-balance support: No upper extremity supported;Feet supported Sitting balance-Leahy Scale: Good     Standing balance support: No upper extremity supported;During functional  activity Standing balance-Leahy Scale: Fair Standing balance comment: standing at sink to wash hands                           ADL either performed or assessed with clinical judgement   ADL Overall ADL's : Needs assistance/impaired Eating/Feeding: Independent;Sitting   Grooming: Wash/dry hands;Standing   Upper Body Bathing: Set up;Sitting   Lower Body Bathing: Min guard;Sit to/from stand   Upper Body Dressing : Set up;Standing   Lower Body Dressing: Min guard;Sit to/from stand   Toilet Transfer: Minimal assistance;Ambulation;RW;Regular Toilet;Grab bars   Toileting- Clothing Manipulation and Hygiene: Min guard;Sit to/from stand               Vision Patient Visual Report: No change from baseline              Pertinent Vitals/Pain Pain Assessment: No/denies pain     Hand Dominance Right   Extremity/Trunk Assessment Upper Extremity Assessment Upper Extremity Assessment: Overall WFL for tasks assessed     Communication Communication Communication: No difficulties   Cognition Arousal/Alertness: Awake/alert Behavior During Therapy: WFL for tasks assessed/performed Overall Cognitive Status: No family/caregiver present to determine baseline cognitive functioning                                 General Comments: prior to this admission she called her niece saying her legs were weak and she could not get up and did not want to try due to other falls. 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 Niece in area) Type of Home: Apartment Home Access: Level entry     Home Layout: One level     Bathroom Shower/Tub: Teacher, early years/pre: Standard     Home Equipment:  Environmental consultant - 2 wheels;Cane - single point;Grab bars - tub/shower;Shower seat   Additional Comments: pt reports she cooks some for herself      Prior Functioning/Environment Level of Independence: Needs assistance  Gait / Transfers Assistance Needed: Uses SPC for ambulation. Reports multiple falls at home. ADL's / Homemaking Assistance Needed: Reports her own dressing and bathing (aid is there just to S and help her in and out of tube)            OT Problem List: Impaired balance (sitting and/or standing)      OT Treatment/Interventions: Self-care/ADL training;DME and/or AE instruction;Balance training    OT Goals(Current goals can be found in the care plan section) Acute Rehab OT Goals Patient Stated Goal: to go home OT Goal Formulation: With patient Time For Goal Achievement: 04/10/19 Potential to Achieve Goals: Good  OT Frequency: Min 2X/week              AM-PAC OT "6 Clicks" Daily Activity     Outcome Measure Help from another person eating meals?: None Help from another person taking care of personal grooming?: A Little Help from another person toileting, which includes using toliet, bedpan, or urinal?: A Little Help from another person bathing (including washing, rinsing, drying)?: A Little Help from another person to put on and taking off regular upper body clothing?: A Little Help from another person to put on and taking off regular lower body clothing?: A Little 6 Click Score: 19   End of Session Equipment Utilized During Treatment: Gait belt;Rolling walker Nurse Communication: (pt needs new purewick put back in, RN came while I was still in room and placed one for pt)  Activity Tolerance: Patient tolerated treatment well Patient left: in bed;with call bell/phone within reach;with bed alarm set  OT Visit Diagnosis: Unsteadiness on feet (R26.81);Muscle weakness (generalized) (M62.81);History of falling (Z91.81)                Time: 1451-1511 OT Time Calculation  (min): 20 min Charges:  OT General Charges $OT Visit: 1 Visit OT Evaluation $OT Eval Moderate Complexity: 1 Mod  [place] [address], OTR/L Acute [employer] Pager [phone] Office [phone]     Almon Register 03/27/2019, 3:22 PM

## 2019-03-27 NOTE — Progress Notes (Signed)
Spoke to pt niece, Anette, she states pt had laser eye surgery to improve vision and drains placed but does not believe there is metal present. She also stated pt does live in a senior [facility] facility called [facility]. Informed MD and MRI

## 2019-03-28 ENCOUNTER — Observation Stay (HOSPITAL_BASED_OUTPATIENT_CLINIC_OR_DEPARTMENT_OTHER): Payer: Medicare Other

## 2019-03-28 DIAGNOSIS — R42 Dizziness and giddiness: Secondary | ICD-10-CM | POA: Diagnosis not present

## 2019-03-28 DIAGNOSIS — I361 Nonrheumatic tricuspid (valve) insufficiency: Secondary | ICD-10-CM

## 2019-03-28 DIAGNOSIS — I340 Nonrheumatic mitral (valve) insufficiency: Secondary | ICD-10-CM | POA: Diagnosis not present

## 2019-03-28 LAB — ECHOCARDIOGRAM COMPLETE
Height: 67 in
Weight: 1904 oz

## 2019-03-28 LAB — BASIC METABOLIC PANEL
Anion gap: 4 — ABNORMAL LOW (ref 5–15)
BUN: 43 mg/dL — ABNORMAL HIGH (ref 8–23)
CO2: 21 mmol/L — ABNORMAL LOW (ref 22–32)
Calcium: 8.6 mg/dL — ABNORMAL LOW (ref 8.9–10.3)
Chloride: 116 mmol/L — ABNORMAL HIGH (ref 98–111)
Creatinine, Ser: 1.4 mg/dL — ABNORMAL HIGH (ref 0.44–1.00)
GFR calc Af Amer: 39 mL/min — ABNORMAL LOW (ref >60)
GFR calc non Af Amer: 34 mL/min — ABNORMAL LOW (ref >60)
Glucose, Bld: 89 mg/dL (ref 70–99)
Potassium: 4.2 mmol/L (ref 3.5–5.1)
Sodium: 141 mmol/L (ref 135–145)

## 2019-03-28 LAB — CBC
HCT: 28.5 % — ABNORMAL LOW (ref 36.0–46.0)
Hemoglobin: 9.3 g/dL — ABNORMAL LOW (ref 12.0–15.0)
MCH: 31.1 pg (ref 26.0–34.0)
MCHC: 32.6 g/dL (ref 30.0–36.0)
MCV: 95.3 fL (ref 80.0–100.0)
Platelets: 233 10*3/uL (ref 150–400)
RBC: 2.99 MIL/uL — ABNORMAL LOW (ref 3.87–5.11)
RDW: 16.2 % — ABNORMAL HIGH (ref 11.5–15.5)
WBC: 5.4 10*3/uL (ref 4.0–10.5)
nRBC: 0 % (ref 0.0–0.2)

## 2019-03-28 LAB — GLUCOSE, CAPILLARY
Glucose-Capillary: 94 mg/dL (ref 70–99)
Glucose-Capillary: 92 mg/dL (ref 70–99)
Glucose-Capillary: 98 mg/dL (ref 70–99)
Glucose-Capillary: 157 mg/dL — ABNORMAL HIGH (ref 70–99)

## 2019-03-28 LAB — T4, FREE: Free T4: 0.9 ng/dL (ref 0.61–1.12)

## 2019-03-28 MED ORDER — FUROSEMIDE 20 MG PO TABS
20.0000 mg | ORAL_TABLET | Freq: Every day | ORAL | Status: DC
  Administered 2019-03-28: 20 mg via ORAL
  Filled 2019-03-28: qty 1

## 2019-03-28 MED ORDER — LEVOTHYROXINE SODIUM 112 MCG PO TABS: 112.0000 ug | ORAL_TABLET | Freq: Every day | ORAL | 0 refills | Status: DC

## 2019-03-28 MED ORDER — LEVOTHYROXINE SODIUM 112 MCG PO TABS: 112.0000 ug | ORAL_TABLET | Freq: Every day | ORAL | Status: DC

## 2019-03-28 MED ORDER — FUROSEMIDE 20 MG PO TABS: 20.0000 mg | ORAL_TABLET | Freq: Every day | ORAL | 0 refills | Status: DC

## 2019-03-28 MED ORDER — HYDRALAZINE HCL 25 MG PO TABS: 25.0000 mg | ORAL_TABLET | Freq: Three times a day (TID) | ORAL | 0 refills | Status: DC

## 2019-03-28 NOTE — Care Management (Signed)
Spoke w patient at bedside. She states that she lives at home alone in a senior living complex. She states that she has an aid through Medicaid that stays with her 2 hours a day. She has a RW and 3/1 at home. She does not want to go to SNF but wishes to return home.  She most recently had [facility] for [facility] services and would like to use them again if possible. Patient will need [place] orders and referral placed to [place].

## 2019-03-28 NOTE — Care Management Obs Status (Signed)
[place] NOTIFICATION   Patient Details  Name: Gina [name] MRN: [id number] Date of Birth: 1929/07/30   Medicare Observation Status Notification Given:  Yes    Carles Collet, RN 03/28/2019, 9:13 AM

## 2019-03-28 NOTE — Plan of Care (Signed)
Patient stable, discussed POC with patient, agreeable with plan, denies question/concerns at this time.  

## 2019-03-28 NOTE — Progress Notes (Addendum)
D/C instructions provided to patient and niece [name], denies questions/concerns at this time. Patient transported to front entrance via WC, tol well.

## 2019-03-28 NOTE — Discharge Summary (Addendum)
Physician Discharge Summary  Gina [name] NLG:[id number] DOB: 1929-03-06 DOA: 03/26/2019  PCP: Clent Demark, PA-C  Admit date: 03/26/2019 Discharge date: 03/28/2019  Time spent: 35 minutes  Recommendations for Outpatient Follow-up:  1. PCP in 1 week, started on low dose lasix, please check volume status and Bmet at FU 2. Health PT OT RN aide 3. Please recheck TSH in 4 weeks   Discharge Diagnoses:    Dizziness   Mild cognitive deficits   HLD (hyperlipidemia)   Essential hypertension   PVD (peripheral vascular disease) (HCC)   Diabetes mellitus, type 2 (HCC)   Chronic diastolic congestive heart failure (HCC)   CKD (chronic kidney disease) stage 3, GFR 30-59 ml/min (HCC)   Discharge Condition: Improved  Diet recommendation: Low-sodium heart healthy diabetic  Filed Weights   03/26/19 1849  Weight: 54 kg    History of present illness:  85 year old chronically ill female with hypertension, CVA, dyslipidemia, peripheral vascular disease, type 2 diabetes mellitus, stage III kidney disease, chronic diastolic CHF, history of intermittent dizziness, also mild memory loss, presented to the ED last night with dizziness and nausea. -This reportedly happens occasionally sometimes once a month, yesterday she was trying to get out of her couch after a nap and was unable to stand up because she felt very dizzy, initially she is reported feeling a spinning sensation but subsequently said it was more like lightheadedness or blacking out.  Very poor historian   Hospital Course:   Dizziness -Unable to tease out if this is vertigo versus lightheadedness/presyncope, very poor historian, CT head with atrophy only, suspect some cognitive dysfunction/memory loss at baseline -She did have mild drop in systolic blood pressure on standing, standing BP was 163/55 compared to 175/82 laying down, technically did not meet orthostatic criteria -Hydrated clinically improved, had no recurrence of  dizziness -Was also seen by neurology in consultation MRI brain was recommended however she has a metal object in her right eye hence MRI could not be performed  -Physical therapy evaluation completed, SNF was recommended primarily due to mild cognitive deficits however patient adamantly declined rehab she was set up with [facility] services namely PT OT RN and aide, needs RN assistance for medications -Also had a 2D echocardiogram completed showed normal EF and no significant valvular disease, and an abnormal jet of uncertain significance, given advanced age and comorbidities no further workup indicated  Short-term memory loss/mild cognitive deficits -B12 was normal, TSH was significantly elevated at 18,  I suspect she is not taking her Synthroid regularly or appropriately restarted on Synthroid, dose increased to 112 mcg emphasized that she needs to take this early in the morning before breakfast, needs TSH rechecked in 4 weeks, anticipate level to slowly decrease from 18 range after improved compliance -Ideally needs assisted living or SNF however adamantly declines this, she will be discharged home with [facility] services, needs assistance from home health nurse regarding medications etc.  Hypothyroidism -See discussion above  Hypertension -Continued regimen of hydralazine and amlodipine  Chronic diastolic CHF -Clinically euvolemic, started on lasix 20mg  daily, please check Bmet at FU in 1 week  Stage III kidney disease -Stable at baseline  Type 2 diabetes mellitus -On meds at home, sliding scale insulin for now, check hemoglobin A1c  Dyslipidemia -Continue statin   Discharge Exam: Vitals:   03/28/19 0731 03/28/19 1138  BP: (!) 166/136 (!) 170/61  Pulse: 75 (!) 57  Resp: 18 16  Temp: 97.8 F (36.6 C) 98 F (  36.7 C)  SpO2: 94% 95%    General: Alert awake oriented x2 Cardiovascular: S1-S2/regular rate rhythm Respiratory: Clear  bilaterally  Discharge Instructions    Allergies as of 03/28/2019   No Known Allergies     Medication List    STOP taking these medications   losartan 100 MG tablet Commonly known as: COZAAR     TAKE these medications   amLODipine 10 MG tablet Commonly known as: NORVASC Take 1 tablet (10 mg total) by mouth daily.   aspirin 81 MG EC tablet Take 1 tablet (81 mg total) by mouth daily.   atorvastatin 20 MG tablet Commonly known as: LIPITOR Take 1 tablet (20 mg total) by mouth daily at 6 PM.   feeding supplement (ENSURE ENLIVE) Liqd Take 237 mLs by mouth 2 (two) times daily between meals.   ferrous sulfate 325 (65 FE) MG EC tablet Take 1 tablet (325 mg total) by mouth daily.   hydrALAZINE 25 MG tablet Commonly known as: APRESOLINE Take 1 tablet (25 mg total) by mouth 3 (three) times daily.   latanoprost 0.005 % ophthalmic solution Commonly known as: XALATAN Place 1 drop into both eyes at bedtime.   levothyroxine 112 MCG tablet Commonly known as: SYNTHROID Take 1 tablet (112 mcg total) by mouth daily. What changed:   medication strength  how much to take   Linzess 145 MCG Caps capsule Generic drug: linaclotide Take 1 capsule (145 mcg total) by mouth daily before breakfast. What changed: See the new instructions.   mirtazapine 15 MG tablet Commonly known as: REMERON take ONE-HALF tablet BY MOUTH AT BEDTIME   Rhopressa 0.02 % Soln Generic drug: Netarsudil Dimesylate Place 1 drop into both eyes at bedtime.      No Known Allergies    The results of significant diagnostics from this hospitalization (including imaging, microbiology, ancillary and laboratory) are listed below for reference.    Significant Diagnostic Studies: Dg Eye Foreign Body  Result Date: 03/27/2019 CLINICAL DATA:  Metal working/exposure; clearance prior to MRI EXAM: ORBITS FOR FOREIGN BODY - 2 VIEW COMPARISON:  None. FINDINGS: Water's views with eyes deviated to the left into the  right were obtained. There is a focus of apparent metal within the right orbit. There is opacification in the left maxillary antrum. No fracture or dislocation. IMPRESSION: Focus of metal within the right orbit. This finding is a contraindication for MRI. There is opacification in the left maxillary antrum. These results will be called to the ordering clinician or representative by the Radiologist Assistant, and communication documented in the PACS or zVision Dashboard. Electronically Signed   By: Lowella Grip III M.D.   On: 03/27/2019 12:31   Ct Head Wo Contrast  Result Date: 03/26/2019 CLINICAL DATA:  85 year old female with vertigo. EXAM: CT HEAD WITHOUT CONTRAST TECHNIQUE: Contiguous axial images were obtained from the base of the skull through the vertex without intravenous contrast. COMPARISON:  Head CT dated 02/07/2016 FINDINGS: Brain: There is mild age-related atrophy and moderate chronic microvascular ischemic changes. There is no acute intracranial hemorrhage. No mass effect or midline shift. No extra-axial fluid collection. Vascular: No hyperdense vessel or unexpected calcification. Skull: Normal. Negative for fracture or focal lesion. Sinuses/Orbits: Left maxillary sinus retention cyst or polyp. The remainder of the visualized paranasal sinuses and mastoid air cells are clear. No air-fluid level. A 3 mm radiopaque foreign object over the right globe similar to prior CT. Other: None IMPRESSION: 1. No acute intracranial hemorrhage. 2. Age-related atrophy and chronic microvascular  ischemic changes. Electronically Signed   By: Anner Crete M.D.   On: 03/26/2019 21:01    Microbiology: No results found for this or any previous visit (from the past 240 hour(s)).   Labs: Basic Metabolic Panel: Recent Labs  Lab 03/26/19 2128 03/28/19 0509  NA 138 141  K 4.6 4.2  CL 115* 116*  CO2 17* 21*  GLUCOSE 139* 89  BUN 44* 43*  CREATININE 1.40* 1.40*  CALCIUM 8.7* 8.6*   Liver Function  Tests: Recent Labs  Lab 03/26/19 2128  AST 17  ALT 14  ALKPHOS 57  BILITOT 0.7  PROT 6.9  ALBUMIN 3.5   No results for input(s): LIPASE, AMYLASE in the last 168 hours. No results for input(s): AMMONIA in the last 168 hours. CBC: Recent Labs  Lab 03/26/19 2128 03/28/19 0509  WBC 5.3 5.4  NEUTROABS 3.7  --   HGB 9.7* 9.3*  HCT 31.1* 28.5*  MCV 100.0 95.3  PLT 223 233   Cardiac Enzymes: No results for input(s): CKTOTAL, CKMB, CKMBINDEX, TROPONINI in the last 168 hours. BNP: BNP (last 3 results) No results for input(s): BNP in the last 8760 hours.  ProBNP (last 3 results) No results for input(s): PROBNP in the last 8760 hours.  CBG: Recent Labs  Lab 03/27/19 1557 03/27/19 2107 03/28/19 0610 03/28/19 0833 03/28/19 1252  GLUCAP 142* 112* 94 92 98       Signed:  Domenic Polite MD.  [facility] 03/28/2019, 2:06 PM

## 2019-03-28 NOTE — Progress Notes (Signed)
Physical Therapy Treatment Patient Details Name: Gina [name] MRN: [id number] DOB: 1928-10-24 Today's Date: 03/28/2019    History of Present Illness 85 year old chronically ill female with hypertension, CVA, dyslipidemia, peripheral vascular disease, type 2 diabetes mellitus, stage III kidney disease, chronic diastolic CHF, history of intermittent dizziness, also mild memory loss, presented to the ED last night with dizziness and nausea    PT Comments    Patient seen for mobility progression. Pt is making progress toward PT goals. Pt requires min guard for safety with OOB mobility using RW. Pt will need 24 hour assistance/supervision upon d/c. Continue to progress as tolerated.      Follow Up Recommendations  SNF;Supervision/Assistance - 24 hour     Equipment Recommendations  None recommended by PT    Recommendations for Other Services       Precautions / Restrictions Precautions Precautions: Fall Restrictions Weight Bearing Restrictions: No    Mobility  Bed Mobility Overal bed mobility: Modified Independent Bed Mobility: Supine to Sit           General bed mobility comments: increased time and effort   Transfers Overall transfer level: Needs assistance Equipment used: Rolling walker (2 wheeled) Transfers: Sit to/from Stand Sit to Stand: Min guard         General transfer comment: min guard for safety  Ambulation/Gait Ambulation/Gait assistance: Min guard Gait Distance (Feet): 100 Feet Assistive device: Rolling walker (2 wheeled) Gait Pattern/deviations: Step-through pattern;Decreased stride length;Narrow base of support;Trunk flexed Gait velocity: decraesed   General Gait Details: min guard for safety; cues for safe use of AD when turning; scissoring with turn but no overt LOB   Stairs             Wheelchair Mobility    Modified Rankin (Stroke Patients Only)       Balance Overall balance assessment: Needs assistance Sitting-balance  support: No upper extremity supported;Feet supported Sitting balance-Leahy Scale: Good     Standing balance support: During functional activity;Bilateral upper extremity supported Standing balance-Leahy Scale: Poor(for gait)                              Cognition Arousal/Alertness: Awake/alert Behavior During Therapy: WFL for tasks assessed/performed Overall Cognitive Status: No family/caregiver present to determine baseline cognitive functioning                                 General Comments: pt gets easily distracted and needs cues for navigating small spaces--likely due to R visual deficits      Exercises      General Comments        Pertinent Vitals/Pain Pain Assessment: No/denies pain    Home Living                      Prior Function            PT Goals (current goals can now be found in the care plan section) Acute Rehab PT Goals Patient Stated Goal: to go home Progress towards PT goals: Progressing toward goals    Frequency    Min 4X/week      PT Plan Current plan remains appropriate    Co-evaluation              AM-PAC PT "6 Clicks" Mobility   Outcome Measure  Help needed turning from your back to your side  while in a flat bed without using bedrails?: None Help needed moving from lying on your back to sitting on the side of a flat bed without using bedrails?: A Little Help needed moving to and from a bed to a chair (including a wheelchair)?: A Little Help needed standing up from a chair using your arms (e.g., wheelchair or bedside chair)?: A Little Help needed to walk in hospital room?: A Little Help needed climbing 3-5 steps with a railing? : A Lot 6 Click Score: 18    End of Session Equipment Utilized During Treatment: Gait belt Activity Tolerance: Patient tolerated treatment well Patient left: with call bell/phone within reach;in chair Nurse Communication: Mobility status PT Visit Diagnosis:  Unsteadiness on feet (R26.81)     Time: 7902-4097 PT Time Calculation (min) (ACUTE ONLY): 34 min  Charges:  $Gait Training: 23-37 mins                     Earney Navy, PTA Acute Rehabilitation Services Pager: [phone] Office: [phone]     Darliss Cheney 03/28/2019, 4:17 PM

## 2019-03-28 NOTE — TOC Transition Note (Signed)
Transition of Care [facility]) - CM/SW Discharge Note   Patient Details  Name: Gina [name] MRN: [id number] Date of Birth: 21-Feb-1929  [facility] CM/SW Contact:  Carles Collet, RN Phone Number: 03/28/2019, 3:53 PM   Clinical Narrative:   Referral placed to [facility] and accepted. No other CM needs identified.     Final next level of care: [place] Barriers to Discharge: No Barriers Identified   Patient Goals and CMS Choice Patient states their goals for this hospitalization and ongoing recovery are:: to return home CMS Medicare.gov Compare Post Acute Care list provided to:: Patient Choice offered to / list presented to : Patient  Discharge Placement                       Discharge Plan and Services                          HH Arranged: RN, PT, OT, Nurse's Aide [facility] Agency: [place] Date [facility] Agency Contacted: 03/28/19 Time HH Agency Contacted: 1500 Representative spoke with at [place]: [place] Interventions     Readmission Risk Interventions No flowsheet data found.

## 2019-03-28 NOTE — Progress Notes (Signed)
  Echocardiogram 2D Echocardiogram has been performed.  Randa Lynn Dj [place] 03/28/2019, 12:28 PM

## 2019-03-30 ENCOUNTER — Other Ambulatory Visit (INDEPENDENT_AMBULATORY_CARE_PROVIDER_SITE_OTHER): Payer: Self-pay | Admitting: Family Medicine

## 2019-03-30 DIAGNOSIS — I10 Essential (primary) hypertension: Secondary | ICD-10-CM

## 2019-03-30 DIAGNOSIS — I739 Peripheral vascular disease, unspecified: Secondary | ICD-10-CM

## 2019-03-30 LAB — NOVEL CORONAVIRUS, NAA (HOSP ORDER, SEND-OUT TO REF LAB; TAT 18-24 HRS): SARS-CoV-2, NAA: NOT DETECTED

## 2019-03-31 ENCOUNTER — Other Ambulatory Visit (INDEPENDENT_AMBULATORY_CARE_PROVIDER_SITE_OTHER): Payer: Self-pay

## 2019-03-31 DIAGNOSIS — I739 Peripheral vascular disease, unspecified: Secondary | ICD-10-CM

## 2019-03-31 DIAGNOSIS — I10 Essential (primary) hypertension: Secondary | ICD-10-CM

## 2019-03-31 MED ORDER — ATORVASTATIN CALCIUM 20 MG PO TABS: 20.0000 mg | ORAL_TABLET | Freq: Every day | ORAL | 1 refills | Status: DC

## 2019-03-31 MED ORDER — ATORVASTATIN CALCIUM 20 MG PO TABS: 20.0000 mg | ORAL_TABLET | Freq: Every day | ORAL | 3 refills | Status: DC

## 2019-03-31 NOTE — Telephone Encounter (Signed)
Had to resend refill. Too many refills placed on reorder. Changed to 1 refill. Pharmacy is aware and has cancelled previous order. Nat Christen, CMA

## 2019-03-31 NOTE — Telephone Encounter (Signed)
Refill request from pharmacy. Medication refill sent. Nat Christen, CMA

## 2019-04-09 ENCOUNTER — Other Ambulatory Visit: Payer: Self-pay

## 2019-04-09 DIAGNOSIS — I739 Peripheral vascular disease, unspecified: Secondary | ICD-10-CM

## 2019-04-09 DIAGNOSIS — I6523 Occlusion and stenosis of bilateral carotid arteries: Secondary | ICD-10-CM

## 2019-04-11 ENCOUNTER — Encounter (HOSPITAL_COMMUNITY): Payer: Self-pay

## 2019-04-11 ENCOUNTER — Inpatient Hospital Stay (HOSPITAL_COMMUNITY)
Admission: EM | Admit: 2019-04-11 | Discharge: 2019-04-20 | DRG: 291 | Disposition: A | Discharge status: Home Health | Payer: Medicare Other | Attending: Internal Medicine | Admitting: Internal Medicine

## 2019-04-11 ENCOUNTER — Other Ambulatory Visit: Payer: Self-pay

## 2019-04-11 ENCOUNTER — Emergency Department (HOSPITAL_COMMUNITY): Payer: Medicare Other

## 2019-04-11 DIAGNOSIS — Z87891 Personal history of nicotine dependence: Secondary | ICD-10-CM

## 2019-04-11 DIAGNOSIS — Z9011 Acquired absence of right breast and nipple: Secondary | ICD-10-CM | POA: Diagnosis not present

## 2019-04-11 DIAGNOSIS — I130 Hypertensive heart and chronic kidney disease with heart failure and stage 1 through stage 4 chronic kidney disease, or unspecified chronic kidney disease: Principal | ICD-10-CM | POA: Diagnosis present

## 2019-04-11 DIAGNOSIS — E1122 Type 2 diabetes mellitus with diabetic chronic kidney disease: Secondary | ICD-10-CM | POA: Diagnosis present

## 2019-04-11 DIAGNOSIS — I10 Essential (primary) hypertension: Secondary | ICD-10-CM | POA: Diagnosis present

## 2019-04-11 DIAGNOSIS — Z90710 Acquired absence of both cervix and uterus: Secondary | ICD-10-CM | POA: Diagnosis not present

## 2019-04-11 DIAGNOSIS — Z8711 Personal history of peptic ulcer disease: Secondary | ICD-10-CM | POA: Diagnosis not present

## 2019-04-11 DIAGNOSIS — N179 Acute kidney failure, unspecified: Secondary | ICD-10-CM | POA: Diagnosis present

## 2019-04-11 DIAGNOSIS — J90 Pleural effusion, not elsewhere classified: Secondary | ICD-10-CM | POA: Diagnosis not present

## 2019-04-11 DIAGNOSIS — Z9111 Patient's noncompliance with dietary regimen: Secondary | ICD-10-CM

## 2019-04-11 DIAGNOSIS — N183 Chronic kidney disease, stage 3 unspecified: Secondary | ICD-10-CM | POA: Diagnosis present

## 2019-04-11 DIAGNOSIS — E785 Hyperlipidemia, unspecified: Secondary | ICD-10-CM | POA: Diagnosis present

## 2019-04-11 DIAGNOSIS — Z853 Personal history of malignant neoplasm of breast: Secondary | ICD-10-CM

## 2019-04-11 DIAGNOSIS — I5033 Acute on chronic diastolic (congestive) heart failure: Secondary | ICD-10-CM | POA: Diagnosis present

## 2019-04-11 DIAGNOSIS — Z90722 Acquired absence of ovaries, bilateral: Secondary | ICD-10-CM | POA: Diagnosis not present

## 2019-04-11 DIAGNOSIS — Z79899 Other long term (current) drug therapy: Secondary | ICD-10-CM

## 2019-04-11 DIAGNOSIS — Z9114 Patient's other noncompliance with medication regimen: Secondary | ICD-10-CM | POA: Diagnosis not present

## 2019-04-11 DIAGNOSIS — E1151 Type 2 diabetes mellitus with diabetic peripheral angiopathy without gangrene: Secondary | ICD-10-CM | POA: Diagnosis present

## 2019-04-11 DIAGNOSIS — D509 Iron deficiency anemia, unspecified: Secondary | ICD-10-CM | POA: Diagnosis present

## 2019-04-11 DIAGNOSIS — Z20828 Contact with and (suspected) exposure to other viral communicable diseases: Secondary | ICD-10-CM | POA: Diagnosis present

## 2019-04-11 DIAGNOSIS — I509 Heart failure, unspecified: Secondary | ICD-10-CM

## 2019-04-11 DIAGNOSIS — E43 Unspecified severe protein-calorie malnutrition: Secondary | ICD-10-CM | POA: Diagnosis present

## 2019-04-11 DIAGNOSIS — Z681 Body mass index (BMI) 19 or less, adult: Secondary | ICD-10-CM | POA: Diagnosis not present

## 2019-04-11 DIAGNOSIS — R600 Localized edema: Secondary | ICD-10-CM | POA: Diagnosis present

## 2019-04-11 DIAGNOSIS — J9601 Acute respiratory failure with hypoxia: Secondary | ICD-10-CM | POA: Diagnosis present

## 2019-04-11 DIAGNOSIS — D539 Nutritional anemia, unspecified: Secondary | ICD-10-CM | POA: Diagnosis present

## 2019-04-11 DIAGNOSIS — Z79890 Hormone replacement therapy (postmenopausal): Secondary | ICD-10-CM

## 2019-04-11 DIAGNOSIS — Z8615 Personal history of latent tuberculosis infection: Secondary | ICD-10-CM

## 2019-04-11 DIAGNOSIS — E119 Type 2 diabetes mellitus without complications: Secondary | ICD-10-CM

## 2019-04-11 DIAGNOSIS — M1612 Unilateral primary osteoarthritis, left hip: Secondary | ICD-10-CM | POA: Diagnosis present

## 2019-04-11 DIAGNOSIS — E7800 Pure hypercholesterolemia, unspecified: Secondary | ICD-10-CM | POA: Diagnosis present

## 2019-04-11 DIAGNOSIS — E039 Hypothyroidism, unspecified: Secondary | ICD-10-CM | POA: Diagnosis present

## 2019-04-11 DIAGNOSIS — I739 Peripheral vascular disease, unspecified: Secondary | ICD-10-CM | POA: Diagnosis present

## 2019-04-11 DIAGNOSIS — Z8049 Family history of malignant neoplasm of other genital organs: Secondary | ICD-10-CM

## 2019-04-11 LAB — CBC WITH DIFFERENTIAL/PLATELET
Abs Immature Granulocytes: 0.05 10*3/uL (ref 0.00–0.07)
Basophils Absolute: 0 10*3/uL (ref 0.0–0.1)
Basophils Relative: 1 %
Eosinophils Absolute: 0 10*3/uL (ref 0.0–0.5)
Eosinophils Relative: 0 %
HCT: 28.8 % — ABNORMAL LOW (ref 36.0–46.0)
Hemoglobin: 9.1 g/dL — ABNORMAL LOW (ref 12.0–15.0)
Immature Granulocytes: 1 %
Lymphocytes Relative: 14 %
Lymphs Abs: 1 10*3/uL (ref 0.7–4.0)
MCH: 31.6 pg (ref 26.0–34.0)
MCHC: 31.6 g/dL (ref 30.0–36.0)
MCV: 100 fL (ref 80.0–100.0)
Monocytes Absolute: 0.4 10*3/uL (ref 0.1–1.0)
Monocytes Relative: 6 %
Neutro Abs: 5.4 10*3/uL (ref 1.7–7.7)
Neutrophils Relative %: 78 %
Platelets: 265 10*3/uL (ref 150–400)
RBC: 2.88 MIL/uL — ABNORMAL LOW (ref 3.87–5.11)
RDW: 16 % — ABNORMAL HIGH (ref 11.5–15.5)
WBC: 6.9 10*3/uL (ref 4.0–10.5)
nRBC: 0 % (ref 0.0–0.2)

## 2019-04-11 LAB — COMPREHENSIVE METABOLIC PANEL
ALT: 14 U/L (ref 0–44)
AST: 20 U/L (ref 15–41)
Albumin: 3.7 g/dL (ref 3.5–5.0)
Alkaline Phosphatase: 52 U/L (ref 38–126)
Anion gap: 8 (ref 5–15)
BUN: 46 mg/dL — ABNORMAL HIGH (ref 8–23)
CO2: 18 mmol/L — ABNORMAL LOW (ref 22–32)
Calcium: 8.7 mg/dL — ABNORMAL LOW (ref 8.9–10.3)
Chloride: 114 mmol/L — ABNORMAL HIGH (ref 98–111)
Creatinine, Ser: 1.57 mg/dL — ABNORMAL HIGH (ref 0.44–1.00)
GFR calc Af Amer: 34 mL/min — ABNORMAL LOW (ref >60)
GFR calc non Af Amer: 29 mL/min — ABNORMAL LOW (ref >60)
Glucose, Bld: 139 mg/dL — ABNORMAL HIGH (ref 70–99)
Potassium: 3.8 mmol/L (ref 3.5–5.1)
Sodium: 140 mmol/L (ref 135–145)
Total Bilirubin: 0.7 mg/dL (ref 0.3–1.2)
Total Protein: 6.5 g/dL (ref 6.5–8.1)

## 2019-04-11 LAB — BRAIN NATRIURETIC PEPTIDE: B Natriuretic Peptide: 1065.8 pg/mL — ABNORMAL HIGH (ref 0.0–100.0)

## 2019-04-11 LAB — SARS CORONAVIRUS 2 BY RT PCR (HOSPITAL ORDER, PERFORMED IN ~~LOC~~ HOSPITAL LAB): SARS Coronavirus 2: NEGATIVE

## 2019-04-11 MED ORDER — LATANOPROST 0.005 % OP SOLN
1.0000 [drp] | Freq: Every day | OPHTHALMIC | Status: DC
  Administered 2019-04-11 – 2019-04-19 (×9): 1 [drp] via OPHTHALMIC
  Filled 2019-04-11: qty 2.5

## 2019-04-11 MED ORDER — MIRTAZAPINE 15 MG PO TABS
7.5000 mg | ORAL_TABLET | Freq: Every day | ORAL | Status: DC
  Administered 2019-04-11 – 2019-04-19 (×9): 7.5 mg via ORAL
  Filled 2019-04-11 (×10): qty 1

## 2019-04-11 MED ORDER — FUROSEMIDE 10 MG/ML IJ SOLN
40.0000 mg | Freq: Three times a day (TID) | INTRAMUSCULAR | Status: DC
  Administered 2019-04-11 – 2019-04-12 (×4): 40 mg via INTRAVENOUS
  Filled 2019-04-11 (×7): qty 4

## 2019-04-11 MED ORDER — NETARSUDIL DIMESYLATE 0.02 % OP SOLN: 1.0000 [drp] | Freq: Every day | OPHTHALMIC | Status: DC

## 2019-04-11 MED ORDER — FUROSEMIDE 10 MG/ML IJ SOLN
20.0000 mg | Freq: Once | INTRAMUSCULAR | Status: AC
  Administered 2019-04-11: 20 mg via INTRAVENOUS
  Filled 2019-04-11: qty 2

## 2019-04-11 MED ORDER — LEVOTHYROXINE SODIUM 112 MCG PO TABS
112.0000 ug | ORAL_TABLET | Freq: Every day | ORAL | Status: DC
  Administered 2019-04-12 – 2019-04-20 (×8): 112 ug via ORAL
  Filled 2019-04-11 (×10): qty 1

## 2019-04-11 MED ORDER — ACETAMINOPHEN 325 MG PO TABS
650.0000 mg | ORAL_TABLET | Freq: Four times a day (QID) | ORAL | Status: DC | PRN
  Administered 2019-04-12 – 2019-04-19 (×3): 650 mg via ORAL
  Filled 2019-04-11 (×3): qty 2

## 2019-04-11 MED ORDER — HYDRALAZINE HCL 25 MG PO TABS
25.0000 mg | ORAL_TABLET | Freq: Three times a day (TID) | ORAL | Status: DC
  Administered 2019-04-11 – 2019-04-20 (×27): 25 mg via ORAL
  Filled 2019-04-11 (×28): qty 1

## 2019-04-11 MED ORDER — ENOXAPARIN SODIUM 30 MG/0.3ML ~~LOC~~ SOLN
30.0000 mg | SUBCUTANEOUS | Status: DC
  Administered 2019-04-11 – 2019-04-17 (×7): 30 mg via SUBCUTANEOUS
  Filled 2019-04-11 (×8): qty 0.3

## 2019-04-11 MED ORDER — ATORVASTATIN CALCIUM 10 MG PO TABS
20.0000 mg | ORAL_TABLET | Freq: Every day | ORAL | Status: DC
  Administered 2019-04-11 – 2019-04-19 (×9): 20 mg via ORAL
  Filled 2019-04-11 (×10): qty 2

## 2019-04-11 MED ORDER — ONDANSETRON HCL 4 MG PO TABS
4.0000 mg | ORAL_TABLET | Freq: Four times a day (QID) | ORAL | Status: DC | PRN
  Administered 2019-04-13: 4 mg via ORAL
  Filled 2019-04-11: qty 1

## 2019-04-11 MED ORDER — VITAMIN E 180 MG (400 UNIT) PO CAPS
400.0000 [IU] | ORAL_CAPSULE | Freq: Every day | ORAL | Status: DC
  Administered 2019-04-12 – 2019-04-18 (×7): 400 [IU] via ORAL
  Filled 2019-04-11 (×9): qty 1

## 2019-04-11 MED ORDER — AMLODIPINE BESYLATE 10 MG PO TABS
10.0000 mg | ORAL_TABLET | Freq: Every day | ORAL | Status: DC
  Administered 2019-04-12 – 2019-04-20 (×9): 10 mg via ORAL
  Filled 2019-04-11 (×10): qty 1

## 2019-04-11 MED ORDER — VITAMIN B-12 1000 MCG PO TABS
500.0000 ug | ORAL_TABLET | Freq: Every day | ORAL | Status: DC
  Administered 2019-04-12 – 2019-04-18 (×7): 500 ug via ORAL
  Filled 2019-04-11 (×10): qty 1

## 2019-04-11 MED ORDER — FERROUS SULFATE 325 (65 FE) MG PO TABS
325.0000 mg | ORAL_TABLET | Freq: Every day | ORAL | Status: DC
  Administered 2019-04-11 – 2019-04-20 (×9): 325 mg via ORAL
  Filled 2019-04-11 (×10): qty 1

## 2019-04-11 MED ORDER — ONDANSETRON HCL 4 MG/2ML IJ SOLN: 4.0000 mg | Freq: Four times a day (QID) | INTRAMUSCULAR | Status: DC | PRN

## 2019-04-11 MED ORDER — CALCIUM CARBONATE 1500 (600 CA) MG PO TABS
600.0000 mg | ORAL_TABLET | Freq: Every day | ORAL | Status: DC
  Administered 2019-04-12 – 2019-04-14 (×3): 1500 mg via ORAL
  Filled 2019-04-11 (×4): qty 1

## 2019-04-11 MED ORDER — LATANOPROST 0.005 % OP SOLN: 1.0000 [drp] | Freq: Every day | OPHTHALMIC | Status: DC

## 2019-04-11 MED ORDER — ACETAMINOPHEN 650 MG RE SUPP: 650.0000 mg | Freq: Four times a day (QID) | RECTAL | Status: DC | PRN

## 2019-04-11 MED ORDER — ENSURE ENLIVE PO LIQD
237.0000 mL | Freq: Two times a day (BID) | ORAL | Status: DC
  Administered 2019-04-12: 237 mL via ORAL

## 2019-04-11 NOTE — ED Notes (Signed)
3E unable to take report at this time.

## 2019-04-11 NOTE — ED Triage Notes (Signed)
Pt presents to the ED with right eye swelling. Per patient the swelling started this week. Pt reports that she had a surgery on that eye a long time ago. Pt reports that she recently discovered that there is a piece of steel behind that eye from that surgery. Pt also reporting some shortness of breath for a few weeks. Pt 86% on room air upon ED arrival.
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
## 2019-04-11 NOTE — Progress Notes (Signed)
I spoke with patient's nice at the bedside and all questions were addressed.

## 2019-04-11 NOTE — Progress Notes (Signed)
Pt received from ED to 3e20, pt alert and oriented , CCMD called to verify pt on tele box 20, call light in reach, bed in low position, discussed POC and orderes, pt denies pain. Diet tray ordered
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
## 2019-04-12 ENCOUNTER — Inpatient Hospital Stay (HOSPITAL_COMMUNITY): Admission: RE | Admit: 2019-04-12 | Payer: Medicare Other | Source: Ambulatory Visit

## 2019-04-12 ENCOUNTER — Encounter: Payer: Self-pay | Admitting: Family

## 2019-04-12 ENCOUNTER — Other Ambulatory Visit: Payer: Self-pay | Admitting: Family Medicine

## 2019-04-12 ENCOUNTER — Inpatient Hospital Stay (HOSPITAL_COMMUNITY): Payer: Medicare Other

## 2019-04-12 ENCOUNTER — Encounter (HOSPITAL_COMMUNITY): Payer: Self-pay | Admitting: Radiology

## 2019-04-12 ENCOUNTER — Other Ambulatory Visit: Payer: Self-pay | Admitting: Primary Care

## 2019-04-12 ENCOUNTER — Ambulatory Visit: Payer: Medicare Other | Admitting: Family

## 2019-04-12 ENCOUNTER — Encounter (HOSPITAL_COMMUNITY): Payer: Medicare Other

## 2019-04-12 ENCOUNTER — Other Ambulatory Visit (HOSPITAL_COMMUNITY): Payer: Medicare Other

## 2019-04-12 DIAGNOSIS — I10 Essential (primary) hypertension: Secondary | ICD-10-CM

## 2019-04-12 DIAGNOSIS — E1122 Type 2 diabetes mellitus with diabetic chronic kidney disease: Secondary | ICD-10-CM

## 2019-04-12 DIAGNOSIS — K5900 Constipation, unspecified: Secondary | ICD-10-CM

## 2019-04-12 HISTORY — PX: IR THORACENTESIS ASP PLEURAL SPACE W/IMG GUIDE: IMG5380

## 2019-04-12 LAB — BODY FLUID CELL COUNT WITH DIFFERENTIAL
Lymphs, Fluid: 46 %
Monocyte-Macrophage-Serous Fluid: 16 % — ABNORMAL LOW (ref 50–90)
Neutrophil Count, Fluid: 38 % — ABNORMAL HIGH (ref 0–25)
Total Nucleated Cell Count, Fluid: 280 cu mm (ref 0–1000)

## 2019-04-12 LAB — BASIC METABOLIC PANEL
Anion gap: 10 (ref 5–15)
BUN: 44 mg/dL — ABNORMAL HIGH (ref 8–23)
CO2: 22 mmol/L (ref 22–32)
Calcium: 8.8 mg/dL — ABNORMAL LOW (ref 8.9–10.3)
Chloride: 109 mmol/L (ref 98–111)
Creatinine, Ser: 1.61 mg/dL — ABNORMAL HIGH (ref 0.44–1.00)
GFR calc Af Amer: 33 mL/min — ABNORMAL LOW (ref >60)
GFR calc non Af Amer: 28 mL/min — ABNORMAL LOW (ref >60)
Glucose, Bld: 117 mg/dL — ABNORMAL HIGH (ref 70–99)
Potassium: 3.4 mmol/L — ABNORMAL LOW (ref 3.5–5.1)
Sodium: 141 mmol/L (ref 135–145)

## 2019-04-12 LAB — CBC
HCT: 26.9 % — ABNORMAL LOW (ref 36.0–46.0)
Hemoglobin: 8.6 g/dL — ABNORMAL LOW (ref 12.0–15.0)
MCH: 32 pg (ref 26.0–34.0)
MCHC: 32 g/dL (ref 30.0–36.0)
MCV: 100 fL (ref 80.0–100.0)
Platelets: 266 10*3/uL (ref 150–400)
RBC: 2.69 MIL/uL — ABNORMAL LOW (ref 3.87–5.11)
RDW: 15.9 % — ABNORMAL HIGH (ref 11.5–15.5)
WBC: 7.6 10*3/uL (ref 4.0–10.5)
nRBC: 0 % (ref 0.0–0.2)

## 2019-04-12 LAB — PROTEIN, PLEURAL OR PERITONEAL FLUID: Total protein, fluid: <3 g/dL

## 2019-04-12 LAB — LACTATE DEHYDROGENASE, PLEURAL OR PERITONEAL FLUID: LD, Fluid: 82 U/L — ABNORMAL HIGH (ref 3–23)

## 2019-04-12 LAB — GRAM STAIN

## 2019-04-12 LAB — TSH: TSH: 10.489 u[IU]/mL — ABNORMAL HIGH (ref 0.350–4.500)

## 2019-04-12 LAB — LACTATE DEHYDROGENASE: LDH: 267 U/L — ABNORMAL HIGH (ref 98–192)

## 2019-04-12 MED ORDER — LIDOCAINE HCL 1 % IJ SOLN
INTRAMUSCULAR | Status: AC
  Filled 2019-04-12: qty 20

## 2019-04-12 MED ORDER — LIDOCAINE HCL (PF) 1 % IJ SOLN
INTRAMUSCULAR | Status: DC | PRN
  Administered 2019-04-12: 5 mL

## 2019-04-12 MED ORDER — LINACLOTIDE 145 MCG PO CAPS
145.0000 ug | ORAL_CAPSULE | Freq: Every day | ORAL | Status: DC
  Administered 2019-04-12 – 2019-04-20 (×8): 145 ug via ORAL
  Filled 2019-04-12 (×10): qty 1

## 2019-04-12 NOTE — Evaluation (Signed)
Physical Therapy Evaluation Patient Details Name: Gina [name] MRN: [id number] DOB: [birth year] Today's Date: 04/12/2019   History of Present Illness  Pt is an 85 y/o female admitted secondary to worsening SOB and edema. PMH includes HTN, PVD, DM, CKD, and dCHF, and PVD.   Clinical Impression  Pt admitted secondary to problem above with deficits below. Pt requiring min guard A for transfer to chair using RW. Pt reports she lives home alone, but has CNA that comes 7 days/week for 2 hours/day. Will need to ensure safety with mobility progression prior to return home. If pt requiring more assist than baseline and family unable to provide, may need to consider SNF. Will continue to follow acutely to maximize functional mobility independence and safety.     Follow Up Recommendations Home health PT;Supervision/Assistance - 24 hour(pending progression)    Equipment Recommendations  None recommended by PT    Recommendations for Other Services       Precautions / Restrictions Precautions Precautions: Fall Restrictions Weight Bearing Restrictions: No      Mobility  Bed Mobility Overal bed mobility: Needs Assistance Bed Mobility: Supine to Sit     Supine to sit: Supervision     General bed mobility comments: Supervision and increased time to come to sitting. Required assist for line management.   Transfers Overall transfer level: Needs assistance Equipment used: Rolling walker (2 wheeled) Transfers: Sit to/from [facility] Sit to Stand: Min guard Stand pivot transfers: Min guard       General transfer comment: Min guard to stand and transfer to chair for safety. Cues for safe hand placement. Pt's lunch in room, so further mobility limited.   Ambulation/Gait                Stairs            Wheelchair Mobility    Modified Rankin (Stroke Patients Only)       Balance Overall balance assessment: Needs assistance Sitting-balance support: No  upper extremity supported;Feet supported Sitting balance-Leahy Scale: Good     Standing balance support: Bilateral upper extremity supported;During functional activity Standing balance-Leahy Scale: Poor Standing balance comment: Reliant on BUE support                              Pertinent Vitals/Pain Pain Assessment: No/denies pain    Home Living Family/patient expects to be discharged to:: Private residence Living Arrangements: Alone Available Help at Discharge: Family;Personal care attendant;Available PRN/intermittently Type of Home: Apartment Home Access: Stairs to enter Entrance Stairs-Rails: Right Entrance Stairs-Number of Steps: 3 Home Layout: One level Home Equipment: Walker - 2 wheels;Cane - single point;Grab bars - tub/shower;Shower seat Additional Comments: Pt has an aide that comes 7 days/week for 2 hours a day.     Prior Function Level of Independence: Needs assistance   Gait / Transfers Assistance Needed: Reports use of RW at home  ADL's / Homemaking Assistance Needed: Reports aide assists with bathing and dressing.         Hand Dominance   Dominant Hand: Right    Extremity/Trunk Assessment   Upper Extremity Assessment Upper Extremity Assessment: Defer to OT evaluation    Lower Extremity Assessment Lower Extremity Assessment: Generalized weakness    Cervical / Trunk Assessment Cervical / Trunk Assessment: Kyphotic  Communication   Communication: No difficulties  Cognition Arousal/Alertness: Awake/alert Behavior During Therapy: WFL for tasks assessed/performed Overall Cognitive Status: No family/caregiver present to determine  baseline cognitive functioning                                        General Comments      Exercises     Assessment/Plan    PT Assessment Patient needs continued PT services  PT Problem List Decreased strength;Decreased balance;Decreased mobility;Decreased knowledge of use of  DME;Decreased knowledge of precautions;Cardiopulmonary status limiting activity       PT Treatment Interventions Gait training;DME instruction;Functional mobility training;Therapeutic activities;Therapeutic exercise;Balance training;Stair training;Patient/family education    PT Goals (Current goals can be found in the Care Plan section)  Acute Rehab PT Goals Patient Stated Goal: to go home PT Goal Formulation: With patient Time For Goal Achievement: 04/26/19 Potential to Achieve Goals: Good    Frequency Min 3X/week   Barriers to discharge Decreased caregiver support      Co-evaluation               AM-PAC PT "6 Clicks" Mobility  Outcome Measure Help needed turning from your back to your side while in a flat bed without using bedrails?: None Help needed moving from lying on your back to sitting on the side of a flat bed without using bedrails?: A Little Help needed moving to and from a bed to a chair (including a wheelchair)?: A Little Help needed standing up from a chair using your arms (e.g., wheelchair or bedside chair)?: A Little Help needed to walk in hospital room?: A Little Help needed climbing 3-5 steps with a railing? : A Lot 6 Click Score: 18    End of Session Equipment Utilized During Treatment: Gait belt Activity Tolerance: Patient tolerated treatment well Patient left: in chair;with call bell/phone within reach;with chair alarm set Nurse Communication: Mobility status PT Visit Diagnosis: Unsteadiness on feet (R26.81);Muscle weakness (generalized) (M62.81)    Time: 3818-2993 PT Time Calculation (min) (ACUTE ONLY): 16 min   Charges:   PT Evaluation $PT Eval Low Complexity: [place], PT, DPT  Acute Rehabilitation Services  Pager: [phone] Office: [phone]   Rudean Hitt 04/12/2019, 1:22 PM

## 2019-04-12 NOTE — Telephone Encounter (Signed)
FWD to PCP. Karmina Zufall S Danie Hannig, CMA  

## 2019-04-12 NOTE — Progress Notes (Signed)
PROGRESS NOTE    Gina [name]  GUR:[id number] DOB: 1929/04/16 DOA: 04/11/2019 PCP: Loletta Specter, PA-C   Brief Narrative: Gina [name] is a 85 y.o. female with medical history significant of diastolic heart failure, hypertension, CKD stage 3b, type 2 diabetes mellitus, hypothyroidism and peripheral vascular disease. Patient presented secondary to dyspnea and found to have an acute heart failure episode. IV diuresis stared.   Assessment & Plan:   Principal Problem:   Heart failure (HCC) Active Problems:   Essential hypertension   PVD (peripheral vascular disease) (HCC)   Diabetes mellitus, type 2 (HCC)   CKD (chronic kidney disease) stage 3, GFR 30-59 ml/min (HCC)   Acute on chronic diastolic heart failure Recent Transthoracic Echocardiogram from 03/28/2019 significant for an EF of 60-65%. BNP on admission of about 1000. Started on IV lasix. Urine output not well documented. Baseline weight appears to be around 110 lbs -Continue Lasix -Daily weights, strict in and out -Dietician consult  Acute respiratory failure with hypoxia She has been requiring between 2-4 L via [place]. Not on oxygen at home -Wean to room air -Ambulating pulse ox prior to discharge  Acute cardiogenic pulmonary edema Bilateral pleural effusions Secondary to heart failure. Thoracentesis ordered for significant R>L pleural effusions  CKD stage III Baseline creatinine of 1.5-1.6. Currently stable.  Essential hypertension Slightly uncontrolled -Continue amlodipine, hydralazine  Hypothyroidism -Continue Synthroid  Hyperlipidemia -Continue Lipitor  Diabetes mellitus, type 2 Not on outpatient treatment. Last hemoglobin A1C of 5.5%   DVT prophylaxis: Lovenox Code Status:   Code Status: Full Code Family Communication: None Disposition Plan: Discharge likely in 24-48 hours once heart failure symptoms improved and is weaned to room air if able   Consultants:   None  Procedures:    None  Antimicrobials:  None    Subjective: Still with some trouble breathing today but better than yesterday.  Objective: Vitals:   04/12/19 0503 04/12/19 0504 04/12/19 0745 04/12/19 0747  BP:  (!) 157/37 (!) 149/34   Pulse:  98 70   Resp:  18 15   Temp:  98 F (36.7 C) 98.6 F (37 C)   TempSrc:   Oral   SpO2:  99% (!) 86% 92%  Weight: 52.8 kg     Height:        Intake/Output Summary (Last 24 hours) at 04/12/2019 0927 Last data filed at 04/12/2019 0845 Gross per 24 hour  Intake 560 ml  Output --  Net 560 ml   Filed Weights   04/11/19 1100 04/11/19 1606 04/12/19 0503  Weight: 54 kg 52.4 kg 52.8 kg    Examination:  General exam: Appears calm and comfortable Respiratory system: Diminished bilaterally with significant diminished right sided sounds. Respiratory effort normal. Cardiovascular system: S1 & S2 heard, RRR. Gastrointestinal system: Abdomen is nondistended, soft and nontender. No organomegaly or masses felt. Normal bowel sounds heard. Central nervous system: Alert and oriented. No focal neurological deficits. Extremities: No edema. No calf tenderness Skin: No cyanosis. No rashes Psychiatry: Judgement and insight appear normal. Mood & affect appropriate.     Data Reviewed: I have personally reviewed following labs and imaging studies  CBC: Recent Labs  Lab 04/11/19 1218 04/12/19 0816  WBC 6.9 7.6  NEUTROABS 5.4  --   HGB 9.1* 8.6*  HCT 28.8* 26.9*  MCV 100.0 100.0  PLT 265 266   Basic Metabolic Panel: Recent Labs  Lab 04/11/19 1127 04/12/19 0816  NA 140 141  K 3.8 3.4*  CL 114* 109  CO2 18* 22  GLUCOSE 139* 117*  BUN 46* 44*  CREATININE 1.57* 1.61*  CALCIUM 8.7* 8.8*   GFR: Estimated Creatinine Clearance: 20.5 mL/min (A) (by C-G formula based on SCr of 1.61 mg/dL (H)). Liver Function Tests: Recent Labs  Lab 04/11/19 1127  AST 20  ALT 14  ALKPHOS 52  BILITOT 0.7  PROT 6.5  ALBUMIN 3.7   No results for input(s): LIPASE,  AMYLASE in the last 168 hours. No results for input(s): AMMONIA in the last 168 hours. Coagulation Profile: No results for input(s): INR, PROTIME in the last 168 hours. Cardiac Enzymes: No results for input(s): CKTOTAL, CKMB, CKMBINDEX, TROPONINI in the last 168 hours. BNP (last 3 results) No results for input(s): PROBNP in the last 8760 hours. HbA1C: No results for input(s): HGBA1C in the last 72 hours. CBG: No results for input(s): GLUCAP in the last 168 hours. Lipid Profile: No results for input(s): CHOL, HDL, LDLCALC, TRIG, CHOLHDL, LDLDIRECT in the last 72 hours. Thyroid Function Tests: No results for input(s): TSH, T4TOTAL, FREET4, T3FREE, THYROIDAB in the last 72 hours. Anemia Panel: No results for input(s): VITAMINB12, FOLATE, FERRITIN, TIBC, IRON, RETICCTPCT in the last 72 hours. Sepsis Labs: No results for input(s): PROCALCITON, LATICACIDVEN in the last 168 hours.  Recent Results (from the past 240 hour(s))  SARS Coronavirus 2 (CEPHEID- Performed in [facility] hospital lab), Hosp Order     Status: None   Collection Time: 04/11/19 11:27 AM   Specimen: Nasopharyngeal Swab  Result Value Ref Range Status   SARS Coronavirus 2 NEGATIVE NEGATIVE Final    Comment: (NOTE) If result is NEGATIVE SARS-CoV-2 target nucleic acids are NOT DETECTED. The SARS-CoV-2 RNA is generally detectable in upper and lower  respiratory specimens during the acute phase of infection. The lowest  concentration of SARS-CoV-2 viral copies this assay can detect is 250  copies / mL. A negative result does not preclude SARS-CoV-2 infection  and should not be used as the sole basis for treatment or other  patient management decisions.  A negative result may occur with  improper specimen collection / handling, submission of specimen other  than nasopharyngeal swab, presence of viral mutation(s) within the  areas targeted by this assay, and inadequate number of viral copies  (<250 copies / mL). A negative  result must be combined with clinical  observations, patient history, and epidemiological information. If result is POSITIVE SARS-CoV-2 target nucleic acids are DETECTED. The SARS-CoV-2 RNA is generally detectable in upper and lower  respiratory specimens dur ing the acute phase of infection.  Positive  results are indicative of active infection with SARS-CoV-2.  Clinical  correlation with patient history and other diagnostic information is  necessary to determine patient infection status.  Positive results do  not rule out bacterial infection or co-infection with other viruses. If result is PRESUMPTIVE POSTIVE SARS-CoV-2 nucleic acids MAY BE PRESENT.   A presumptive positive result was obtained on the submitted specimen  and confirmed on repeat testing.  While 2019 novel coronavirus  (SARS-CoV-2) nucleic acids may be present in the submitted sample  additional confirmatory testing may be necessary for epidemiological  and / or clinical management purposes  to differentiate between  SARS-CoV-2 and other Sarbecovirus currently known to infect humans.  If clinically indicated additional testing with an alternate test  methodology [phone]) is advised. The SARS-CoV-2 RNA is generally  detectable in upper and lower respiratory sp ecimens during the acute  phase of infection. The expected result is Negative.  Fact Sheet for Patients:  BoilerBrush.com.cy Fact Sheet for Healthcare Providers: https://pope.com/ This test is not yet approved or cleared by the [place] FDA and has been authorized for detection and/or diagnosis of SARS-CoV-2 by FDA under an Emergency Use Authorization (EUA).  This EUA will remain in effect (meaning this test can be used) for the duration of the COVID-19 declaration under Section 564(b)(1) of the Act, 21 U.S.C. section 360bbb-3(b)(1), unless the authorization is terminated or revoked sooner. Performed at [facility], [address] [place], [place] [address]          Radiology Studies: Dg Chest Port 1 View  Result Date: 04/11/2019 CLINICAL DATA:  Shortness of breath EXAM: PORTABLE CHEST 1 VIEW COMPARISON:  January 14, 2019 FINDINGS: There is patchy airspace opacity in the left lower lobe with small left pleural effusion. There is a moderate right pleural effusion with apparent consolidation in portions of the right mid lung and right base. Heart is upper normal in size with pulmonary vascularity normal. There is aortic atherosclerosis. No evident adenopathy. There is calcification in the region of the right subclavian artery. There is also calcification in each carotid artery. IMPRESSION: Areas of apparent pneumonia in both lower lung zones and right mid lung region. Pleural effusions bilaterally, larger on the right than on the left. Heart upper normal in size. No adenopathy evident. Foci of calcification in each carotid artery. Aortic Atherosclerosis (ICD10-I70.0). Electronically Signed   By: Bretta Bang III M.D.   On: 04/11/2019 11:21        Scheduled Meds: . amLODipine  10 mg Oral Daily  . atorvastatin  20 mg Oral q1800  . calcium carbonate  600 mg of elemental calcium Oral Q breakfast  . enoxaparin (LOVENOX) injection  30 mg Subcutaneous Q24H  . feeding supplement (ENSURE ENLIVE)  237 mL Oral BID BM  . ferrous sulfate  325 mg Oral Daily  . furosemide  40 mg Intravenous Q8H  . hydrALAZINE  25 mg Oral TID  . latanoprost  1 drop Both Eyes QHS  . levothyroxine  112 mcg Oral Daily  . mirtazapine  7.5 mg Oral QHS  . Netarsudil Dimesylate  1 drop Both Eyes QHS  . vitamin B-12  500 mcg Oral Daily  . vitamin E  400 Units Oral Daily   Continuous Infusions:   LOS: 1 day     Jacquelin Hawking, MD [facility] 04/12/2019, 9:27 AM  If 7PM-7AM, please contact night-coverage [email or web address]

## 2019-04-12 NOTE — Procedures (Signed)
Ultrasound-guided diagnostic and therapeutic right thoracentesis performed yielding 1.4 liters of yellow fluid. No immediate complications. Follow-up chest x-ray pending. A portion of the fluid was sent to the lab for preordered studies. EBL none.

## 2019-04-12 NOTE — Evaluation (Signed)
Occupational Therapy Evaluation Patient Details Name: Gina [name] MRN: [id number] DOB: 1928-11-04 Today's Date: 04/12/2019    History of Present Illness Pt is an 85 y/o female admitted secondary to worsening SOB and edema. PMH includes HTN, PVD, DM, CKD, and dCHF, and PVD.    Clinical Impression   PTA patient reports independent with dressing and toileting, assist from aide for IADLs and bathing (who comes 7 days/week for 2 hours), and independent using RW for mobility. She was admitted for above and limited by problem list below, including impaired balance, generalized weakness and decreased activity tolerance. Able to complete UB ADLs with setup to min assist, LB ADLs with min guard assist, basic transfers with min guard assist using RW, and short distance mobility in room with min guard assist.  Noted limited eval, as transport arrived to take pt to IR.  Further cognitive assessment recommended. Patient will benefit from continued OT services while admitted and after dc at [facility] level in order to maximize safety and independence with ADLs/mobility.     Follow Up Recommendations  Home health OT;Supervision - Intermittent    Equipment Recommendations  None recommended by OT    Recommendations for Other Services       Precautions / Restrictions Precautions Precautions: Fall Restrictions Weight Bearing Restrictions: No      Mobility Bed Mobility Overal bed mobility: Needs Assistance Bed Mobility: Supine to Sit     Supine to sit: Supervision     General bed mobility comments: Supervision and increased time to come to sitting.   Transfers Overall transfer level: Needs assistance Equipment used: Rolling walker (2 wheeled) Transfers: Sit to/from Stand Sit to Stand: Min guard Stand pivot transfers: Min guard       General transfer comment: min guard for safety and balance, transfer to transport chair    Balance Overall balance assessment: Needs  assistance Sitting-balance support: No upper extremity supported;Feet supported Sitting balance-Leahy Scale: Good     Standing balance support: Bilateral upper extremity supported;During functional activity Standing balance-Leahy Scale: Poor Standing balance comment: Reliant on BUE support                            ADL either performed or assessed with clinical judgement   ADL Overall ADL's : Needs assistance/impaired     Grooming: Min guard;Standing   Upper Body Bathing: Set up;Sitting   Lower Body Bathing: Min guard;Sit to/from stand   Upper Body Dressing : Standing;Minimal assistance Upper Body Dressing Details (indicate cue type and reason): to don house coat  Lower Body Dressing: Min guard;Sit to/from stand Lower Body Dressing Details (indicate cue type and reason): donning slippers with supervision seated, min guard sit to stand  Toilet Transfer: Min guard;Ambulation;RW Toilet Transfer Details (indicate cue type and reason): simulated to chair         Functional mobility during ADLs: Min guard;Rolling walker General ADL Comments: pt limited by generalized weakness, decreased act tolerance     Vision         Perception     Praxis      Pertinent Vitals/Pain Pain Assessment: No/denies pain     Hand Dominance Right   Extremity/Trunk Assessment Upper Extremity Assessment Upper Extremity Assessment: Generalized weakness   Lower Extremity Assessment Lower Extremity Assessment: Defer to PT evaluation   Cervical / Trunk Assessment Cervical / Trunk Assessment: Kyphotic   Communication Communication Communication: No difficulties   Cognition Arousal/Alertness: Awake/alert Behavior During Therapy:  WFL for tasks assessed/performed Overall Cognitive Status: No family/caregiver present to determine baseline cognitive functioning                                 General Comments: limited eval, appears functional with further assessment  recommended   General Comments  limited eval- transport arrived to take pt to IR     Exercises     Shoulder Instructions      Home Living Family/patient expects to be discharged to:: Private residence Living Arrangements: Alone Available Help at Discharge: Family;Personal care attendant;Available PRN/intermittently Type of Home: Apartment Home Access: Stairs to enter Entrance Stairs-Number of Steps: 3 Entrance Stairs-Rails: Right Home Layout: One level     Bathroom Shower/Tub: Teacher, early years/pre: Standard     Home Equipment: Environmental consultant - 2 wheels;Cane - single point;Grab bars - tub/shower;Shower seat   Additional Comments: Pt has an aide that comes 7 days/week for 2 hours a day.       Prior Functioning/Environment Level of Independence: Needs assistance  Gait / Transfers Assistance Needed: Reports use of RW at home ADL's / Homemaking Assistance Needed: reports aide assist with bathing and IADLs as needed, indepednent toileting and dressing             OT Problem List: Decreased strength;Decreased activity tolerance;Impaired balance (sitting and/or standing);Decreased knowledge of use of DME or AE      OT Treatment/Interventions: DME and/or AE instruction;Balance training;Self-care/ADL training;Therapeutic exercise;Therapeutic activities    OT Goals(Current goals can be found in the care plan section) Acute Rehab OT Goals Patient Stated Goal: to go home OT Goal Formulation: With patient Time For Goal Achievement: 04/26/19 Potential to Achieve Goals: Good  OT Frequency: Min 2X/week   Barriers to D/C:            Co-evaluation              AM-PAC OT "6 Clicks" Daily Activity     Outcome Measure Help from another person eating meals?: None Help from another person taking care of personal grooming?: A Little Help from another person toileting, which includes using toliet, bedpan, or urinal?: A Little Help from another person bathing (including  washing, rinsing, drying)?: A Little Help from another person to put on and taking off regular upper body clothing?: A Little Help from another person to put on and taking off regular lower body clothing?: A Little 6 Click Score: 19   End of Session Equipment Utilized During Treatment: Rolling walker Nurse Communication: Mobility status  Activity Tolerance: Patient tolerated treatment well Patient left: in chair;Other (comment)(transport with pt)  OT Visit Diagnosis: Unsteadiness on feet (R26.81);Muscle weakness (generalized) (M62.81)                Time: 1500-1510 OT Time Calculation (min): 10 min Charges:  OT General Charges $OT Visit: 1 Visit OT Evaluation $OT Eval Moderate Complexity: [place], OT Acute Rehabilitation Services Pager [phone] Office [phone]   Delight Stare 04/12/2019, 4:36 PM

## 2019-04-12 NOTE — Progress Notes (Signed)
Spoke with daughter, [name] given updates.

## 2019-04-12 NOTE — Progress Notes (Signed)
I responded to a [place] to provide Advance Directive information for the patient. I visited the patient's room and provided an overview of the AD, however, she said she didn't need to complete one at this time. She said her niece is her 37. I shared words of encouragement and led in prayer. I shared that the Chaplain is available for additional support as needed or requested.    04/12/19 1022  Clinical Encounter Type  Visited With Patient  Visit Type Spiritual support  Referral From Nurse  Consult/Referral To Chaplain  Spiritual Encounters  Spiritual Needs Prayer;Literature    Chaplain Dr Redgie Grayer

## 2019-04-13 LAB — BASIC METABOLIC PANEL
Anion gap: 8 (ref 5–15)
BUN: 46 mg/dL — ABNORMAL HIGH (ref 8–23)
CO2: 22 mmol/L (ref 22–32)
Calcium: 8.3 mg/dL — ABNORMAL LOW (ref 8.9–10.3)
Chloride: 111 mmol/L (ref 98–111)
Creatinine, Ser: 1.78 mg/dL — ABNORMAL HIGH (ref 0.44–1.00)
GFR calc Af Amer: 29 mL/min — ABNORMAL LOW (ref >60)
GFR calc non Af Amer: 25 mL/min — ABNORMAL LOW (ref >60)
Glucose, Bld: 99 mg/dL (ref 70–99)
Potassium: 3.6 mmol/L (ref 3.5–5.1)
Sodium: 141 mmol/L (ref 135–145)

## 2019-04-13 LAB — CBC
HCT: 24 % — ABNORMAL LOW (ref 36.0–46.0)
Hemoglobin: 7.5 g/dL — ABNORMAL LOW (ref 12.0–15.0)
MCH: 31.5 pg (ref 26.0–34.0)
MCHC: 31.3 g/dL (ref 30.0–36.0)
MCV: 100.8 fL — ABNORMAL HIGH (ref 80.0–100.0)
Platelets: 217 10*3/uL (ref 150–400)
RBC: 2.38 MIL/uL — ABNORMAL LOW (ref 3.87–5.11)
RDW: 15.9 % — ABNORMAL HIGH (ref 11.5–15.5)
WBC: 5.9 10*3/uL (ref 4.0–10.5)
nRBC: 0 % (ref 0.0–0.2)

## 2019-04-13 LAB — PH, BODY FLUID: pH, Body Fluid: 7.5

## 2019-04-13 LAB — PATHOLOGIST SMEAR REVIEW

## 2019-04-13 MED ORDER — ADULT MULTIVITAMIN W/MINERALS CH
1.0000 | ORAL_TABLET | Freq: Every day | ORAL | Status: DC
  Administered 2019-04-13 – 2019-04-18 (×6): 1 via ORAL
  Filled 2019-04-13 (×8): qty 1

## 2019-04-13 MED ORDER — POLYETHYLENE GLYCOL 3350 17 G PO PACK
17.0000 g | PACK | Freq: Every day | ORAL | Status: DC
  Administered 2019-04-13 – 2019-04-17 (×5): 17 g via ORAL
  Filled 2019-04-13 (×8): qty 1

## 2019-04-13 MED ORDER — BOOST PLUS PO LIQD
237.0000 mL | Freq: Three times a day (TID) | ORAL | Status: DC
  Administered 2019-04-13 – 2019-04-18 (×9): 237 mL via ORAL
  Filled 2019-04-13 (×23): qty 237

## 2019-04-13 NOTE — Progress Notes (Signed)
 Initial Nutrition Assessment  DOCUMENTATION CODES:   Severe malnutrition in context of chronic illness, Underweight  INTERVENTION:   Recommend liberalizing diet to REGULAR  Change to Boost Plus, each bottle provides 350 kcals and 14 g of protein  D/C Ensure Enlive  Add Magic cup BID with meals, each supplement provides 290 kcal and 9 grams of protein  Add MVI with Minerals   NUTRITION DIAGNOSIS:   Severe Malnutrition related to chronic illness as evidenced by severe muscle depletion, severe fat depletion.   GOAL:   Patient will meet greater than or equal to 90% of their needs   MONITOR:   PO intake, Supplement acceptance, Weight trends, Labs  REASON FOR ASSESSMENT:   Consult Assessment of nutrition requirement/status  ASSESSMENT:   85 yo female admitted with acute on chronic heart failure, acute pulmonary edema with b/l pleural effusions. PMH includes HTN, CKD III, DM, HLD, hypothyroidism  7/1 Thoracentesis with 1.4 L removed  Pt reports no appetite, did not eat much breakfast this AM but reports food was cold and not appealing. Pt reports she eats 3 meals per day at home and does the cooking herself. Unable to get good 24 hour recall from patient. Pt with Ensure Enlive at bedside; pt reports she prefers Boost. RD to d/c Ensure and order Boost Plus. Pt reports she typically drinks 1 Boost Plus per day  Pt is underweight, reports she does not know if she has lost weight recently or not  Labs: Creatinine 1.78, BUN 46, TSH 10.5 (H) Meds: calcium carbonate, ferrous sulfate, lasix, remeron, Vitamin B-12, Vitamin E  NUTRITION - FOCUSED PHYSICAL EXAM:  Facial edema likely masking severity of wasting/fat loss in the facial region    Most Recent Value  Orbital Region  Mild depletion  Upper Arm Region  Severe depletion  Thoracic and Lumbar Region  Severe depletion  Buccal Region  Mild depletion  Temple Region  Moderate depletion  Clavicle Bone Region  Severe  depletion  Clavicle and Acromion Bone Region  Severe depletion  Scapular Bone Region  Severe depletion  Dorsal Hand  Severe depletion  Patellar Region  Severe depletion  Anterior Thigh Region  Severe depletion  Posterior Calf Region  Severe depletion  Edema (RD Assessment)  None       Diet Order:   Diet Order            Diet regular Room service appropriate? Yes; Fluid consistency: Thin  Diet effective now              EDUCATION NEEDS:   Education needs have been addressed  Skin:  Skin Assessment: Reviewed RN Assessment  Last BM:  7/13  Height:   Ht Readings from Last 1 Encounters:  04/11/19 5\' 7"  (1.702 m)    Weight:   Wt Readings from Last 1 Encounters:  04/13/19 51.1 kg    Ideal Body Weight:  61.4 kg  BMI:  Body mass index is 17.64 kg/m.  Estimated Nutritional Needs:   Kcal:  1450-1600 kcals  Protein:  72-80 g  Fluid:  >/= 1.4 L   Taiga Lupinacci MS, RDN, LDN, CNSC [phone] Pager  [phone] Weekend/On-Call Pager

## 2019-04-13 NOTE — Progress Notes (Signed)
Checked oxygen saturation of pt at room air and dropped to 85% at room air.  Pt is currently lying in bed on 2L nasal canula at oxygen saturation of 95%. Call bell within reach and bed alarm on.

## 2019-04-13 NOTE — Progress Notes (Signed)
Patient refused to stand for weight this morning.Patient also refusing 0600 medications.Patient states,"leave me alone."

## 2019-04-13 NOTE — Progress Notes (Signed)
Pt is currently is currently in 2 L of nasal canula with oxygen saturation at 95%. Pt is lying in bed, call bell within reach. Bed alarm on.

## 2019-04-13 NOTE — Progress Notes (Signed)
PROGRESS NOTE    BEAUTIFUL PENSYL  KGU:[id number] DOB: Dec 11, 1928 DOA: 04/11/2019 PCP: Clent Demark, PA-C   Brief Narrative: Gina [name] is a 85 y.o. female with medical history significant of diastolic heart failure, hypertension, CKD stage 3b, type 2 diabetes mellitus, hypothyroidism and peripheral vascular disease. Patient presented secondary to dyspnea and found to have an acute heart failure episode. IV diuresis stared.   Assessment & Plan:   Principal Problem:   Heart failure ([place]) Active Problems:   Essential hypertension   PVD (peripheral vascular disease) (HCC)   Diabetes mellitus, type 2 (HCC)   CKD (chronic kidney disease) stage 3, GFR 30-59 ml/min (HCC)   Acute on chronic diastolic heart failure Recent Transthoracic Echocardiogram from 03/28/2019 significant for an EF of 60-65%. BNP on admission of about 1000. Started on IV lasix. Urine output not well documented. Baseline weight appears to be around 110 lbs. Down to 112 lbs today with associated worsening creatinine. -Hold lasix secondary to worsening renal function -Daily weights, strict in and out -Dietician consulted -Diet liberalized secondary to poor oral intake  Acute respiratory failure with hypoxia She has been requiring between 2-4 L via [place]. Not on oxygen at home -Wean to room air if able -Ambulating pulse ox prior to discharge -Document hypoxia on wean attempts  Acute cardiogenic pulmonary edema Bilateral pleural effusions Secondary to heart failure. Thoracentesis ordered for significant R>L pleural effusions  CKD stage III Baseline creatinine of 1.5-1.6. Currently worsening with a creatinine of 1.78 today. -Diuresis management above  Essential hypertension Slightly uncontrolled -Continue amlodipine, hydralazine  Hypothyroidism -Continue Synthroid  Hyperlipidemia -Continue Lipitor  Diabetes mellitus, type 2 Not on outpatient treatment. Last hemoglobin A1C of 5.5%  Chronic anemia  Baseline hemoglobin is somewhat labile between 7-9 but appears to be somewhere in the middle at about 8. Hemoglobin down to 7.5 today. -CBC in AM -Discontinue Lovenox, place SCDs   DVT prophylaxis: SCDs Code Status:   Code Status: Full Code Family Communication: None Disposition Plan: Discharge likely in 24-48 hours once heart failure symptoms improved and is weaned to room air if able   Consultants:   None  Procedures:   None  Antimicrobials:  None    Subjective: No issues overnight. Patient tired today.  Objective: Vitals:   04/13/19 0041 04/13/19 0539 04/13/19 0545 04/13/19 0802  BP: (!) 141/43  (!) 139/53 (!) 147/36  Pulse: 67  62 67  Resp: 18  18 16   Temp: 98.8 F (37.1 C)  99.7 F (37.6 C) 98.7 F (37.1 C)  TempSrc: Oral  Oral Oral  SpO2: 96%  100% 98%  Weight:  51.1 kg    Height:        Intake/Output Summary (Last 24 hours) at 04/13/2019 1155 Last data filed at 04/13/2019 0537 Gross per 24 hour  Intake 579 ml  Output 300 ml  Net 279 ml   Filed Weights   04/11/19 1606 04/12/19 0503 04/13/19 0539  Weight: 52.4 kg 52.8 kg 51.1 kg    Examination:  General exam: Appears calm and comfortable Respiratory system: diminished breath sounds bilaterally. Respiratory effort normal. Cardiovascular system: S1 & S2 heard, RRR. No murmurs, rubs, gallops or clicks. Gastrointestinal system: Abdomen is nondistended, soft and nontender. No organomegaly or masses felt. Normal bowel sounds heard. Central nervous system: sleeping but easily arouses to voice. No focal neurological deficits. Extremities: No edema. No calf tenderness Skin: No cyanosis. No rashes Psychiatry: Judgement and insight appear normal. Mood & affect appropriate.  Data Reviewed: I have personally reviewed following labs and imaging studies  CBC: Recent Labs  Lab 04/11/19 1218 04/12/19 0816 04/13/19 0859  WBC 6.9 7.6 5.9  NEUTROABS 5.4  --   --   HGB 9.1* 8.6* 7.5*  HCT 28.8* 26.9*  24.0*  MCV 100.0 100.0 100.8*  PLT 265 266 782   Basic Metabolic Panel: Recent Labs  Lab 04/11/19 1127 04/12/19 0816 04/13/19 0859  NA 140 141 141  K 3.8 3.4* 3.6  CL 114* 109 111  CO2 18* 22 22  GLUCOSE 139* 117* 99  BUN 46* 44* 46*  CREATININE 1.57* 1.61* 1.78*  CALCIUM 8.7* 8.8* 8.3*   GFR: Estimated Creatinine Clearance: 18 mL/min (A) (by C-G formula based on SCr of 1.78 mg/dL (H)). Liver Function Tests: Recent Labs  Lab 04/11/19 1127  AST 20  ALT 14  ALKPHOS 52  BILITOT 0.7  PROT 6.5  ALBUMIN 3.7   No results for input(s): LIPASE, AMYLASE in the last 168 hours. No results for input(s): AMMONIA in the last 168 hours. Coagulation Profile: No results for input(s): INR, PROTIME in the last 168 hours. Cardiac Enzymes: No results for input(s): CKTOTAL, CKMB, CKMBINDEX, TROPONINI in the last 168 hours. BNP (last 3 results) No results for input(s): PROBNP in the last 8760 hours. HbA1C: No results for input(s): HGBA1C in the last 72 hours. CBG: No results for input(s): GLUCAP in the last 168 hours. Lipid Profile: No results for input(s): CHOL, HDL, LDLCALC, TRIG, CHOLHDL, LDLDIRECT in the last 72 hours. Thyroid Function Tests: Recent Labs    04/12/19 0816  TSH 10.489*   Anemia Panel: No results for input(s): VITAMINB12, FOLATE, FERRITIN, TIBC, IRON, RETICCTPCT in the last 72 hours. Sepsis Labs: No results for input(s): PROCALCITON, LATICACIDVEN in the last 168 hours.  Recent Results (from the past 240 hour(s))  SARS Coronavirus 2 (CEPHEID- Performed in [place] hospital lab), Hosp Order     Status: None   Collection Time: 04/11/19 11:27 AM   Specimen: Nasopharyngeal Swab  Result Value Ref Range Status   SARS Coronavirus 2 NEGATIVE NEGATIVE Final    Comment: (NOTE) If result is NEGATIVE SARS-CoV-2 target nucleic acids are NOT DETECTED. The SARS-CoV-2 RNA is generally detectable in upper and lower  respiratory specimens during the acute phase of  infection. The lowest  concentration of SARS-CoV-2 viral copies this assay can detect is 250  copies / mL. A negative result does not preclude SARS-CoV-2 infection  and should not be used as the sole basis for treatment or other  patient management decisions.  A negative result may occur with  improper specimen collection / handling, submission of specimen other  than nasopharyngeal swab, presence of viral mutation(s) within the  areas targeted by this assay, and inadequate number of viral copies  (<250 copies / mL). A negative result must be combined with clinical  observations, patient history, and epidemiological information. If result is POSITIVE SARS-CoV-2 target nucleic acids are DETECTED. The SARS-CoV-2 RNA is generally detectable in upper and lower  respiratory specimens dur ing the acute phase of infection.  Positive  results are indicative of active infection with SARS-CoV-2.  Clinical  correlation with patient history and other diagnostic information is  necessary to determine patient infection status.  Positive results do  not rule out bacterial infection or co-infection with other viruses. If result is PRESUMPTIVE POSTIVE SARS-CoV-2 nucleic acids MAY BE PRESENT.   A presumptive positive result was obtained on the submitted specimen  and  confirmed on repeat testing.  While 2019 novel coronavirus  (SARS-CoV-2) nucleic acids may be present in the submitted sample  additional confirmatory testing may be necessary for epidemiological  and / or clinical management purposes  to differentiate between  SARS-CoV-2 and other Sarbecovirus currently known to infect humans.  If clinically indicated additional testing with an alternate test  methodology [phone]) is advised. The SARS-CoV-2 RNA is generally  detectable in upper and lower respiratory sp ecimens during the acute  phase of infection. The expected result is Negative. Fact Sheet for Patients:   StrictlyIdeas.no Fact Sheet for Healthcare Providers: BankingDealers.co.za This test is not yet approved or cleared by the [place] FDA and has been authorized for detection and/or diagnosis of SARS-CoV-2 by FDA under an Emergency Use Authorization (EUA).  This EUA will remain in effect (meaning this test can be used) for the duration of the COVID-19 declaration under Section 564(b)(1) of the Act, 21 U.S.C. section 360bbb-3(b)(1), unless the authorization is terminated or revoked sooner. Performed at [facility], [place] [address], [place], [place] [address]   Gram stain     Status: None   Collection Time: 04/12/19  3:40 PM   Specimen: Pleural, Right; Pleural Fluid  Result Value Ref Range Status   Specimen Description FLUID RIGHT PLEURAL  Final   Special Requests NONE  Final   Gram Stain   Final    WBC PRESENT,BOTH PMN AND MONONUCLEAR NO ORGANISMS SEEN CYTOSPIN SMEAR Performed at [facility], [place] [address] [place], [place] [address]    Report Status 04/12/2019 FINAL  Final         Radiology Studies: Dg Chest 1 View  Result Date: 04/12/2019 CLINICAL DATA:  Post right thoracentesis EXAM: CHEST  1 VIEW COMPARISON:  03/12/2019 FINDINGS: Significant improvement in right pleural effusion now small. No pneumothorax. Moderate left pleural effusion and left lower lobe atelectasis. Left effusion appears larger. Mild vascular congestion without edema. Atherosclerotic aortic arch. IMPRESSION: No complication post right thoracentesis Apparent progression of left pleural effusion. Electronically Signed   By: Franchot Gallo M.D.   On: 04/12/2019 16:07   Ir Thoracentesis Asp Pleural Space W/img Guide  Result Date: 04/12/2019 INDICATION: Patient with history of congestive heart failure, hypertension, chronic kidney disease, bilateral pleural effusions right greater than left. Request made for diagnostic and therapeutic right  thoracentesis. EXAM: ULTRASOUND GUIDED DIAGNOSTIC AND THERAPEUTIC RIGHT THORACENTESIS MEDICATIONS: None COMPLICATIONS: None immediate. PROCEDURE: An ultrasound guided thoracentesis was thoroughly discussed with the patient and questions answered. The benefits, risks, alternatives and complications were also discussed. The patient understands and wishes to proceed with the procedure. Written consent was obtained. Ultrasound was performed to localize and mark an adequate pocket of fluid in the right chest. The area was then prepped and draped in the normal sterile fashion. 1% Lidocaine was used for local anesthesia. Under ultrasound guidance a 6 Fr Safe-T-Centesis catheter was introduced. Thoracentesis was performed. The catheter was removed and a dressing applied. FINDINGS: A total of approximately 1.4 liters of yellow fluid was removed. Samples were sent to the laboratory as requested by the clinical team. IMPRESSION: Successful ultrasound guided diagnostic and therapeutic right thoracentesis yielding 1.4 liters of pleural fluid. Read by: Rowe Robert, PA-C No pneumothorax on follow-up radiograph. Electronically Signed   By: Lucrezia Europe M.D.   On: 04/12/2019 15:53        Scheduled Meds: . amLODipine  10 mg Oral Daily  . atorvastatin  20 mg Oral q1800  .  calcium carbonate  600 mg of elemental calcium Oral Q breakfast  . enoxaparin (LOVENOX) injection  30 mg Subcutaneous Q24H  . ferrous sulfate  325 mg Oral Daily  . hydrALAZINE  25 mg Oral TID  . lactose free nutrition  237 mL Oral TID WC  . latanoprost  1 drop Both Eyes QHS  . levothyroxine  112 mcg Oral Daily  . linaclotide  145 mcg Oral QAC breakfast  . mirtazapine  7.5 mg Oral QHS  . multivitamin with minerals  1 tablet Oral Daily  . vitamin B-12  500 mcg Oral Daily  . vitamin E  400 Units Oral Daily   Continuous Infusions:   LOS: 2 days     Cordelia Poche, MD [facility] 04/13/2019, 11:55 AM  If 7PM-7AM, please contact  night-coverage [email or web address]

## 2019-04-14 MED ORDER — CALCIUM CARBONATE 1250 (500 CA) MG PO TABS
1.0000 | ORAL_TABLET | Freq: Every day | ORAL | Status: DC
  Administered 2019-04-16 – 2019-04-19 (×4): 500 mg via ORAL
  Filled 2019-04-14 (×6): qty 1

## 2019-04-14 NOTE — Care Management Important Message (Signed)
Important Message  Patient Details  Name: Gina [name] MRN: [id number] Date of Birth: Mar 28, 1929   Medicare Important Message Given:  Yes     [name] 04/14/2019, 12:50 PM

## 2019-04-14 NOTE — TOC Initial Note (Signed)
Transition of Care [facility]) - Initial/Assessment Note    Patient Details  Name: Gina [name] MRN: [id number] Date of Birth: 03/15/1929  Transition of Care [facility]) CM/SW Contact:    Carles Collet, RN Phone Number: 04/14/2019, 9:42 AM  Clinical Narrative:           Notified by Jackquline Denmark that patient is active for [facility] services.          Expected Discharge Plan: [place] Barriers to Discharge: Continued Medical Work up   Patient Goals and CMS Choice        Expected Discharge Plan and Services Expected Discharge Plan: [place]                                              Prior Living Arrangements/Services                       Activities of Daily Living Home Assistive Devices/Equipment: Environmental consultant (specify type), Cane (specify quad or straight) ADL Screening (condition at time of admission) Patient's cognitive ability adequate to safely complete daily activities?: Yes Is the patient deaf or have difficulty hearing?: Yes Does the patient have difficulty seeing, even when wearing glasses/contacts?: No Does the patient have difficulty concentrating, remembering, or making decisions?: Yes Patient able to express need for assistance with ADLs?: Yes Does the patient have difficulty dressing or bathing?: Yes Independently performs ADLs?: No Communication: Independent Dressing (OT): Needs assistance Is this a change from baseline?: Pre-admission baseline Grooming: Independent Feeding: Independent Bathing: Needs assistance Is this a change from baseline?: Pre-admission baseline Toileting: Needs assistance Is this a change from baseline?: Pre-admission baseline In/Out Bed: Independent with device (comment) Walks in Home: Independent with device (comment) Does the patient have difficulty walking or climbing stairs?: Yes Weakness of Legs: Both Weakness of Arms/Hands: None  Permission Sought/Granted                   Emotional Assessment              Admission diagnosis:  Periorbital edema of right eye [R60.0] Acute on chronic congestive heart failure, unspecified heart failure type ([place]) [I50.9] Patient Active Problem List   Diagnosis Date Noted  . Heart failure ([place]) 04/11/2019  . TIA (transient ischemic attack) 03/26/2019  . PAD (peripheral artery disease) ([place]) 02/03/2019  . Elevated troponin 01/15/2019  . ARF (acute renal failure) ([place]) 01/14/2019  . Normochromic normocytic anemia 01/14/2019  . Hypertensive heart disease with heart failure ([place]) 10/12/2018  . CKD (chronic kidney disease) stage 3, GFR 30-59 ml/min (HCC) 10/12/2018  . Pseudophakia of both eyes 07/10/2017  . Postsurgical states following surgery of eye and adnexa 05/28/2017  . Primary open-angle glaucoma 05/26/2017  . History of AVM (arteriovenous malformation) of duodenum, acquired with hemorrhage 10/24/2016  . Iron deficiency anemia 10/24/2016  . History of duodenal ulcer 10/24/2016  . Fatigue 01/22/2016  . Chronic diastolic congestive heart failure ([place]) 01/22/2016  . Postablative hypothyroidism 07/19/2014  . His of mesenteric ischemia 01/25/2014  . History of gastric ulcer 05/12/2013  . Protein-calorie malnutrition, severe ([place]) 05/07/2013  . Syncope 05/06/2013  . Fall 03/04/2013  . Thyroid nodule 03/04/2013  . Loss of weight 01/28/2013  . Constipation 09/10/2012  . DDD (degenerative disc disease), cervical 07/22/2012  . Hip pain, bilateral 07/26/2011  . Preventative  health care 07/26/2011  . PVD (peripheral vascular disease) ([place])   . Diabetes mellitus, type 2 ([place])   . TB lung, latent   . HLD (hyperlipidemia) 05/23/2008  . PULMONARY NODULE 12/29/2007  . Essential hypertension 08/04/2006   PCP:  Clent Demark, PA-C Pharmacy:   [place], [place] - [address] [place] [address] Phone: [phone] Fax: [place], [place] [address] Phone: [phone] Fax: [phone]     Social Determinants of Health (SDOH) Interventions    Readmission Risk Interventions No flowsheet data found.

## 2019-04-14 NOTE — Progress Notes (Signed)
Occupational Therapy Treatment Patient Details Name: Gina [name] MRN: [id number] DOB: [birth year] Today's Date: 04/14/2019    History of present illness Pt is an 85 y/o female admitted secondary to worsening SOB and edema. PMH includes HTN, PVD, DM, CKD, and dCHF, and PVD.    OT comments  Patient supine in bed and agreeable to OT.  Patients cognition assessed with Short Blessed Test, pt scoring 12/28 revealing significant impairments in short term memory, attention and sequencing--unsure if this is baseline, pt denies changes in cognition.  Completing grooming at sink, UB/LB dressing, mobility with RW and transfers with RW given supervision for safety.  On 2L supplemental oxygen via [place] at saturations maintained 93-94%--pt reports not wearing oxgyen PTA. Based on cognition, safety, balance and possible need to manage O2 cord at dc, recommend 24/7 support. If this cannot be arranged, pt may require SNF rehab.  Will follow.    Follow Up Recommendations  Home health OT;Supervision - Intermittent(if patient does not have 24/7 supervision will recommend SNF)    Equipment Recommendations  None recommended by OT    Recommendations for Other Services      Precautions / Restrictions Precautions Precautions: Fall Restrictions Weight Bearing Restrictions: No       Mobility Bed Mobility Overal bed mobility: Needs Assistance Bed Mobility: Supine to Sit     Supine to sit: Supervision     General bed mobility comments: Supervision and increased time to come to sitting.   Transfers Overall transfer level: Needs assistance Equipment used: Standard walker Transfers: Sit to/from Stand Sit to Stand: Supervision         General transfer comment: close supervision for safety and balance    Balance Overall balance assessment: Needs assistance Sitting-balance support: No upper extremity supported;Feet supported Sitting balance-Leahy Scale: Good     Standing balance support:  Bilateral upper extremity supported;During functional activity;No upper extremity supported Standing balance-Leahy Scale: Fair Standing balance comment: able to groom without UE support, relaint on support during mobility with RW                            ADL either performed or assessed with clinical judgement   ADL Overall ADL's : Needs assistance/impaired     Grooming: Supervision/safety;Standing;Wash/dry hands;Wash/dry face;Oral care           Upper Body Dressing : Set up;Sitting Upper Body Dressing Details (indicate cue type and reason): to don house coat  Lower Body Dressing: Supervision/safety;Sit to/from stand Lower Body Dressing Details (indicate cue type and reason): donning slipper seated with supervision, sit to stand supervision Toilet Transfer: Supervision/safety;Ambulation;RW Toilet Transfer Details (indicate cue type and reason): simulated to recliner          Functional mobility during ADLs: Supervision/safety;Rolling walker       Vision       Perception     Praxis      Cognition Arousal/Alertness: Awake/alert Behavior During Therapy: WFL for tasks assessed/performed Overall Cognitive Status: No family/caregiver present to determine baseline cognitive functioning                                 General Comments: pt presents with STM, decreased attention and poor sequencing-- completed short blessed test and reveals 12/28 (impairment consistent with dementia)         Exercises     Shoulder Instructions  General Comments VSS- SpO2 on 2L supplemental oxygen via [place] 93-94%    Pertinent Vitals/ Pain       Pain Assessment: No/denies pain  Home Living                                          Prior Functioning/Environment              Frequency  Min 2X/week        Progress Toward Goals  OT Goals(current goals can now be found in the care plan section)  Progress towards OT goals:  Progressing toward goals  Acute Rehab OT Goals Patient Stated Goal: to go home OT Goal Formulation: With patient Time For Goal Achievement: 04/26/19 Potential to Achieve Goals: Good  Plan Frequency remains appropriate;Discharge plan needs to be updated    Co-evaluation                 AM-PAC OT "6 Clicks" Daily Activity     Outcome Measure   Help from another person eating meals?: None Help from another person taking care of personal grooming?: A Little Help from another person toileting, which includes using toliet, bedpan, or urinal?: A Little Help from another person bathing (including washing, rinsing, drying)?: A Little Help from another person to put on and taking off regular upper body clothing?: A Little Help from another person to put on and taking off regular lower body clothing?: A Little 6 Click Score: 19    End of Session Equipment Utilized During Treatment: Rolling walker;Oxygen(2L)  OT Visit Diagnosis: Unsteadiness on feet (R26.81);Muscle weakness (generalized) (M62.81)   Activity Tolerance Patient tolerated treatment well   Patient Left in chair;with call bell/phone within reach;with chair alarm set   Nurse Communication Mobility status        Time: 7989-2119 OT Time Calculation (min): 39 min  Charges: OT General Charges $OT Visit: 1 Visit OT Treatments $Self Care/Home Management : 23-37 mins $Therapeutic Activity: 8-22 mins  Delight Stare, [place] Pager [phone] Office [phone]    Delight Stare 04/14/2019, 3:17 PM

## 2019-04-14 NOTE — Progress Notes (Signed)
Pt declines any therapy stating she has already just walked and is not interested in just doing exercises.   04/14/19 1541  PT Visit Information  Last PT Received On 04/14/19  Assistance Needed +1  Reason Eval/Treat Not Completed Fatigue/lethargy limiting ability to participate    Mee Hives, PT MS Acute Rehab Dept. Number: [place] [facility] [place]

## 2019-04-14 NOTE — Progress Notes (Signed)
Pts niece given updates.

## 2019-04-14 NOTE — Progress Notes (Signed)
Spoke to pt regarding speaking with niece to give her some updates, pt gave to ok to do that.

## 2019-04-14 NOTE — Progress Notes (Signed)
As per phlebotomist, pt refused Blood draw. Spoke with the pt and still refused.

## 2019-04-14 NOTE — Progress Notes (Signed)
PROGRESS NOTE  Gina [name] HUT:[id number] DOB: Nov 28, 1928 DOA: 04/11/2019 PCP: Clent Demark, PA-C  HPI/Recap of past 24 hours: Gina [name] is a 85 y.o. femalewith medical history significant ofdiastolic heart failure, hypertension,CKD stage 3b,type 2 diabetes mellitus, hypothyroidism and peripheral vascular disease. Patient presented secondary to dyspnea and found to have an acute heart failure episode. IV diuresis started.  04/14/19: Patient was seen and examined at her bedside.  She wants to go home and refuses to participate in her evaluation.   Assessment/Plan: Principal Problem:   Heart failure (HCC) Active Problems:   Essential hypertension   PVD (peripheral vascular disease) (HCC)   Diabetes mellitus, type 2 (HCC)   CKD (chronic kidney disease) stage 3, GFR 30-59 ml/min (HCC)  Acute on chronic diastolic CHF 2D echo done on 03/20/2019 showed EF 60 to 65% Elevated BNP on admission Started on IV Lasix Continue to closely monitor strict I's and's and daily weight Lasix on hold due to AKI.  Acute hypoxic respiratory failure likely secondary to acute on chronic diastolic CHF Currently requiring 2 L of oxygen by nasal cannula to maintain O2 saturation greater than 92% Continue to wean off oxygen supplementation Home O2 evaluation prior to discharge  Pulmonary edema with bilateral pleural effusion likely cardiogenic Lasix on hold due to AKI  AKI on CKD 3 Creatinine at baseline 1.40 with GFR 39 Presented with creatinine of 1.57 Creatinine yesterday 1.78 with GFR of 29 Continue to avoid nephrotoxins Continue to hold diuretics for now Monitor urine output Repeat BMP in the morning   Essential hypertension Slightly uncontrolled -Continue amlodipine, hydralazine  Hypothyroidism -Continue Synthroid  Hyperlipidemia -Continue Lipitor  Diabetes mellitus, type 2 Not on outpatient treatment. Last hemoglobin A1C of 5.5%  Chronic anemia Baseline  hemoglobin is somewhat labile between 7-9 but appears to be somewhere in the middle at about 8. Hemoglobin down to 7.5 today. -CBC in AM -Discontinue Lovenox, place SCDs   DVT prophylaxis: SCDs subcu Lovenox daily Code Status:   Code Status: Full Code Family Communication: None Disposition Plan: Discharge likely in 24-48 hours once heart failure symptoms improved and is weaned to room air if able   Consultants:   None  Procedures:   None  Antimicrobials:  None     Objective: Vitals:   04/13/19 1751 04/13/19 1949 04/14/19 0513 04/14/19 0838  BP: (!) 158/47 (!) 152/35 (!) 141/36 (!) 159/39  Pulse: 75 68 77 77  Resp: 17 18 18 16   Temp: 98.6 F (37 C) 98 F (36.7 C) 99.9 F (37.7 C) 99.2 F (37.3 C)  TempSrc: Oral  Oral Oral  SpO2: 92% 91% 91% 100%  Weight:   51.8 kg   Height:        Intake/Output Summary (Last 24 hours) at 04/14/2019 0856 Last data filed at 04/14/2019 0600 Gross per 24 hour  Intake 250 ml  Output 1000 ml  Net -750 ml   Filed Weights   04/12/19 0503 04/13/19 0539 04/14/19 0513  Weight: 52.8 kg 51.1 kg 51.8 kg    Exam:  . General: 85 y.o. year-old female Emaciated.  In no acute distress.  She does not participate in the evaluation.   . Cardiovascular: Regular rate and rhythm with no rubs or gallops.  No thyromegaly or JVD noted.   Marland Kitchen Respiratory: Mild rales at bases with no wheezes noted.  Poor inspiratory effort.. . Abdomen: Soft nontender nondistended with normal bowel sounds x4 quadrants. . Musculoskeletal: No lower extremity edema. 2/4 pulses  in all 4 extremities. Marland Kitchen Psychiatry: Mood is appropriate for condition and setting   Data Reviewed: CBC: Recent Labs  Lab 04/11/19 1218 04/12/19 0816 04/13/19 0859  WBC 6.9 7.6 5.9  NEUTROABS 5.4  --   --   HGB 9.1* 8.6* 7.5*  HCT 28.8* 26.9* 24.0*  MCV 100.0 100.0 100.8*  PLT 265 266 237   Basic Metabolic Panel: Recent Labs  Lab 04/11/19 1127 04/12/19 0816 04/13/19 0859  NA  140 141 141  K 3.8 3.4* 3.6  CL 114* 109 111  CO2 18* 22 22  GLUCOSE 139* 117* 99  BUN 46* 44* 46*  CREATININE 1.57* 1.61* 1.78*  CALCIUM 8.7* 8.8* 8.3*   GFR: Estimated Creatinine Clearance: 18.2 mL/min (A) (by C-G formula based on SCr of 1.78 mg/dL (H)). Liver Function Tests: Recent Labs  Lab 04/11/19 1127  AST 20  ALT 14  ALKPHOS 52  BILITOT 0.7  PROT 6.5  ALBUMIN 3.7   No results for input(s): LIPASE, AMYLASE in the last 168 hours. No results for input(s): AMMONIA in the last 168 hours. Coagulation Profile: No results for input(s): INR, PROTIME in the last 168 hours. Cardiac Enzymes: No results for input(s): CKTOTAL, CKMB, CKMBINDEX, TROPONINI in the last 168 hours. BNP (last 3 results) No results for input(s): PROBNP in the last 8760 hours. HbA1C: No results for input(s): HGBA1C in the last 72 hours. CBG: No results for input(s): GLUCAP in the last 168 hours. Lipid Profile: No results for input(s): CHOL, HDL, LDLCALC, TRIG, CHOLHDL, LDLDIRECT in the last 72 hours. Thyroid Function Tests: Recent Labs    04/12/19 0816  TSH 10.489*   Anemia Panel: No results for input(s): VITAMINB12, FOLATE, FERRITIN, TIBC, IRON, RETICCTPCT in the last 72 hours. Urine analysis:    Component Value Date/Time   COLORURINE YELLOW 03/26/2019 2253   APPEARANCEUR CLEAR 03/26/2019 2253   LABSPEC 1.011 03/26/2019 2253   PHURINE 5.0 03/26/2019 2253   GLUCOSEU NEGATIVE 03/26/2019 2253   GLUCOSEU NEG mg/dL 02/20/2009 2049   HGBUR SMALL (A) 03/26/2019 2253   BILIRUBINUR NEGATIVE 03/26/2019 2253   BILIRUBINUR neg 05/06/2018 1200   KETONESUR NEGATIVE 03/26/2019 2253   PROTEINUR 100 (A) 03/26/2019 2253   UROBILINOGEN 0.2 05/06/2018 1200   UROBILINOGEN 0.2 04/25/2014 1023   NITRITE NEGATIVE 03/26/2019 2253   LEUKOCYTESUR NEGATIVE 03/26/2019 2253   Sepsis Labs: @LABRCNTIP (procalcitonin:4,lacticidven:4)  ) Recent Results (from the past 240 hour(s))  SARS Coronavirus 2 (CEPHEID-  Performed in [place] hospital lab), Hosp Order     Status: None   Collection Time: 04/11/19 11:27 AM   Specimen: Nasopharyngeal Swab  Result Value Ref Range Status   SARS Coronavirus 2 NEGATIVE NEGATIVE Final    Comment: (NOTE) If result is NEGATIVE SARS-CoV-2 target nucleic acids are NOT DETECTED. The SARS-CoV-2 RNA is generally detectable in upper and lower  respiratory specimens during the acute phase of infection. The lowest  concentration of SARS-CoV-2 viral copies this assay can detect is 250  copies / mL. A negative result does not preclude SARS-CoV-2 infection  and should not be used as the sole basis for treatment or other  patient management decisions.  A negative result may occur with  improper specimen collection / handling, submission of specimen other  than nasopharyngeal swab, presence of viral mutation(s) within the  areas targeted by this assay, and inadequate number of viral copies  (<250 copies / mL). A negative result must be combined with clinical  observations, patient history, and epidemiological information. If  result is POSITIVE SARS-CoV-2 target nucleic acids are DETECTED. The SARS-CoV-2 RNA is generally detectable in upper and lower  respiratory specimens dur ing the acute phase of infection.  Positive  results are indicative of active infection with SARS-CoV-2.  Clinical  correlation with patient history and other diagnostic information is  necessary to determine patient infection status.  Positive results do  not rule out bacterial infection or co-infection with other viruses. If result is PRESUMPTIVE POSTIVE SARS-CoV-2 nucleic acids MAY BE PRESENT.   A presumptive positive result was obtained on the submitted specimen  and confirmed on repeat testing.  While 2019 novel coronavirus  (SARS-CoV-2) nucleic acids may be present in the submitted sample  additional confirmatory testing may be necessary for epidemiological  and / or clinical management  purposes  to differentiate between  SARS-CoV-2 and other Sarbecovirus currently known to infect humans.  If clinically indicated additional testing with an alternate test  methodology [phone]) is advised. The SARS-CoV-2 RNA is generally  detectable in upper and lower respiratory sp ecimens during the acute  phase of infection. The expected result is Negative. Fact Sheet for Patients:  StrictlyIdeas.no Fact Sheet for Healthcare Providers: BankingDealers.co.za This test is not yet approved or cleared by the [place] FDA and has been authorized for detection and/or diagnosis of SARS-CoV-2 by FDA under an Emergency Use Authorization (EUA).  This EUA will remain in effect (meaning this test can be used) for the duration of the COVID-19 declaration under Section 564(b)(1) of the Act, 21 U.S.C. section 360bbb-3(b)(1), unless the authorization is terminated or revoked sooner. Performed at [facility], [place] [address] [place], [place] [address]   Gram stain     Status: None   Collection Time: 04/12/19  3:40 PM   Specimen: Pleural, Right; Pleural Fluid  Result Value Ref Range Status   Specimen Description FLUID RIGHT PLEURAL  Final   Special Requests NONE  Final   Gram Stain   Final    WBC PRESENT,BOTH PMN AND MONONUCLEAR NO ORGANISMS SEEN CYTOSPIN SMEAR Performed at [facility], [place] [address], [place], [place] [address]    Report Status 04/12/2019 FINAL  Final  Culture, body fluid-bottle     Status: None (Preliminary result)   Collection Time: 04/12/19  3:40 PM   Specimen: Fluid  Result Value Ref Range Status   Specimen Description FLUID PLEURAL RIGHT  Final   Special Requests BOTTLES DRAWN AEROBIC AND ANAEROBIC  Final   Culture   Final    NO GROWTH < 24 HOURS Performed at [facility], [place] [address], [place], [place] [address]    Report Status PENDING  Incomplete      Studies: No results found.   Scheduled Meds: . amLODipine  10 mg Oral Daily  . atorvastatin  20 mg Oral q1800  . calcium carbonate  600 mg of elemental calcium Oral Q breakfast  . enoxaparin (LOVENOX) injection  30 mg Subcutaneous Q24H  . ferrous sulfate  325 mg Oral Daily  . hydrALAZINE  25 mg Oral TID  . lactose free nutrition  237 mL Oral TID WC  . latanoprost  1 drop Both Eyes QHS  . levothyroxine  112 mcg Oral Daily  . linaclotide  145 mcg Oral QAC breakfast  . mirtazapine  7.5 mg Oral QHS  . multivitamin with minerals  1 tablet Oral Daily  . polyethylene glycol  17 g Oral Daily  . vitamin B-12  500 mcg Oral Daily  .  vitamin E  400 Units Oral Daily    Continuous Infusions:   LOS: 3 days     Kayleen Memos, MD [facility] Pager [phone]  If 7PM-7AM, please contact night-coverage [email or web address] Password [facility] 04/14/2019, 8:56 AM

## 2019-04-15 LAB — BASIC METABOLIC PANEL
Anion gap: 8 (ref 5–15)
BUN: 49 mg/dL — ABNORMAL HIGH (ref 8–23)
CO2: 24 mmol/L (ref 22–32)
Calcium: 8.7 mg/dL — ABNORMAL LOW (ref 8.9–10.3)
Chloride: 106 mmol/L (ref 98–111)
Creatinine, Ser: 1.96 mg/dL — ABNORMAL HIGH (ref 0.44–1.00)
GFR calc Af Amer: 26 mL/min — ABNORMAL LOW (ref >60)
GFR calc non Af Amer: 22 mL/min — ABNORMAL LOW (ref >60)
Glucose, Bld: 101 mg/dL — ABNORMAL HIGH (ref 70–99)
Potassium: 4.3 mmol/L (ref 3.5–5.1)
Sodium: 138 mmol/L (ref 135–145)

## 2019-04-15 LAB — CBC
HCT: 23 % — ABNORMAL LOW (ref 36.0–46.0)
Hemoglobin: 7.2 g/dL — ABNORMAL LOW (ref 12.0–15.0)
MCH: 31.4 pg (ref 26.0–34.0)
MCHC: 31.3 g/dL (ref 30.0–36.0)
MCV: 100.4 fL — ABNORMAL HIGH (ref 80.0–100.0)
Platelets: 203 10*3/uL (ref 150–400)
RBC: 2.29 MIL/uL — ABNORMAL LOW (ref 3.87–5.11)
RDW: 15.2 % (ref 11.5–15.5)
WBC: 5.2 10*3/uL (ref 4.0–10.5)
nRBC: 0 % (ref 0.0–0.2)

## 2019-04-15 LAB — PHOSPHORUS: Phosphorus: 3.4 mg/dL (ref 2.5–4.6)

## 2019-04-15 LAB — PREPARE RBC (CROSSMATCH)

## 2019-04-15 LAB — MAGNESIUM: Magnesium: 2.1 mg/dL (ref 1.7–2.4)

## 2019-04-15 MED ORDER — IPRATROPIUM-ALBUTEROL 0.5-2.5 (3) MG/3ML IN SOLN
3.0000 mL | Freq: Four times a day (QID) | RESPIRATORY_TRACT | Status: DC
  Administered 2019-04-15 – 2019-04-16 (×6): 3 mL via RESPIRATORY_TRACT
  Filled 2019-04-15 (×6): qty 3

## 2019-04-15 MED ORDER — SODIUM CHLORIDE 0.9% IV SOLUTION
Freq: Once | INTRAVENOUS | Status: AC
  Administered 2019-04-15: 17:00:00 via INTRAVENOUS

## 2019-04-15 MED ORDER — ENSURE ENLIVE PO LIQD: 237.0000 mL | Freq: Two times a day (BID) | ORAL | Status: DC

## 2019-04-15 MED ORDER — IPRATROPIUM-ALBUTEROL 20-100 MCG/ACT IN AERS: 1.0000 | INHALATION_SPRAY | Freq: Four times a day (QID) | RESPIRATORY_TRACT | Status: DC

## 2019-04-15 NOTE — Progress Notes (Signed)
Physical Therapy Treatment Patient Details Name: Gina [name] MRN: [id number] DOB: 12/23/1928 Today's Date: 04/15/2019    History of Present Illness Pt is an 85 y/o female admitted secondary to worsening SOB and edema. PMH includes HTN, PVD, DM, CKD, and dCHF, and PVD.     PT Comments    Pt supine on arrival and eager to return home. Pt reports only having assist of aide 2hrs/ day and otherwise is able to care for herself. Pt with noted memory deficits, incontinence and balance deficits who benefits from increased supervision in the home but unsure if further assist available. Pt reports home has level entry at front with stairs on the back with her normal function exiting from front. Pt with SpO2 82% on Ra with 91% on 2L at rest and required 3L for gait to maintain 90-92% with HR 88. Encouraged increased mobility with nursing assist and pt declined any general LE HEP for strengthening.     Follow Up Recommendations  Home health PT;Supervision/Assistance - 24 hour     Equipment Recommendations  None recommended by PT    Recommendations for Other Services       Precautions / Restrictions Precautions Precautions: Fall Precaution Comments: watch sats    Mobility  Bed Mobility Overal bed mobility: Modified Independent             General bed mobility comments: pt able to transition supine to sitting without assist  Transfers Overall transfer level: Modified independent   Transfers: Sit to/from Stand Sit to Stand: Modified independent (Device/Increase time)            Ambulation/Gait Ambulation/Gait assistance: Min guard Gait Distance (Feet): 350 Feet Assistive device: Rolling walker (2 wheeled) Gait Pattern/deviations: Step-through pattern;Decreased stride length;Narrow base of support;Trunk flexed   Gait velocity interpretation: >2.62 ft/sec, indicative of community ambulatory General Gait Details: guarding for safety with cues for proximity to RW as well as  directional cues   Stairs             Wheelchair Mobility    Modified Rankin (Stroke Patients Only)       Balance Overall balance assessment: Needs assistance Sitting-balance support: No upper extremity supported;Feet supported Sitting balance-Leahy Scale: Good     Standing balance support: Bilateral upper extremity supported;During functional activity Standing balance-Leahy Scale: Poor Standing balance comment: RW for gait and standing                            Cognition Arousal/Alertness: Awake/alert Behavior During Therapy: WFL for tasks assessed/performed Overall Cognitive Status: No family/caregiver present to determine baseline cognitive functioning                                        Exercises      General Comments        Pertinent Vitals/Pain Pain Assessment: No/denies pain    Home Living                      Prior Function            PT Goals (current goals can now be found in the care plan section) Progress towards PT goals: Progressing toward goals    Frequency           PT Plan Current plan remains appropriate    Co-evaluation  AM-PAC PT "6 Clicks" Mobility   Outcome Measure  Help needed turning from your back to your side while in a flat bed without using bedrails?: None Help needed moving from lying on your back to sitting on the side of a flat bed without using bedrails?: None Help needed moving to and from a bed to a chair (including a wheelchair)?: A Little Help needed standing up from a chair using your arms (e.g., wheelchair or bedside chair)?: A Little Help needed to walk in hospital room?: A Little Help needed climbing 3-5 steps with a railing? : A Little 6 Click Score: 20    End of Session Equipment Utilized During Treatment: Gait belt;Oxygen Activity Tolerance: Patient tolerated treatment well Patient left: in chair;with call bell/phone within reach;with  chair alarm set Nurse Communication: Mobility status PT Visit Diagnosis: Unsteadiness on feet (R26.81);Muscle weakness (generalized) (M62.81)     Time: 7017-7939 PT Time Calculation (min) (ACUTE ONLY): 25 min  Charges:  $Gait Training: 8-22 mins $Therapeutic Activity: 8-22 mins                     Semaje Kinker Pam Drown, PT Acute Rehabilitation Services Pager: [phone] Office: [place] 04/15/2019, 1:02 PM

## 2019-04-15 NOTE — TOC Progression Note (Signed)
Transition of Care [facility]) - Progression Note    Patient Details  Name: Gina [name] MRN: [id number] Date of Birth: 14-Sep-1929  Transition of [facility]) CM/SW Gar[place]di, LCSW Phone Number: 04/15/2019, 2:49 PM  Clinical Narrative:  Readmission prevention screen complete. Patient confirmed she was active with [facility] prior to admission. Per representative she had PT, ST, OT, and nursing. Patient will need oxygen when she returns home. Will order once discharged.   Expected Discharge Plan: [place] Barriers to Discharge: Continued Medical Work up  Expected Discharge Plan and Services Expected Discharge Plan: [place]                                               Social Determinants of Health (SDOH) Interventions    Readmission Risk Interventions Readmission Risk Prevention Plan 04/15/2019  Transportation Screening Complete  HRI or Home Care Consult Complete  Palliative Care Screening Not [place] Not Applicable  Some recent data might be hidden

## 2019-04-15 NOTE — Progress Notes (Addendum)
PROGRESS NOTE  Gina [name] JTT:[id number] DOB: Jul 31, 1929 DOA: 04/11/2019 PCP: Clent Demark, PA-C  HPI/Recap of past 24 hours: Gina [name] is a 85 y.o. femalewith medical history significant ofdiastolic heart failure, hypertension,CKD stage 3b,type 2 diabetes mellitus, hypothyroidism and peripheral vascular disease. Patient presented secondary to dyspnea and found to have an acute heart failure episode. IV diuresis started then DC ed sue to AKI.  04/15/19: Patient was seen and examined at her bedside.  She is alert and oriented x3.  She wants to go home.  Unfortunately her renal function worsens.  Hypoxic requiring at least 3 L of oxygen via nasal cannula to maintain O2 saturation greater than 90%.  Not on oxygen supplementation at baseline.   Assessment/Plan: Principal Problem:   Heart failure (HCC) Active Problems:   Essential hypertension   PVD (peripheral vascular disease) (HCC)   Diabetes mellitus, type 2 (HCC)   CKD (chronic kidney disease) stage 3, GFR 30-59 ml/min (HCC)  Acute on chronic diastolic CHF 2D echo done on 03/20/2019 showed EF 60 to 65% Elevated BNP on admission Started on IV Lasix, discontinued due to AKI  Persistent acute hypoxic respiratory failure likely secondary to acute on chronic diastolic CHF Not on oxygen supplementation at baseline. Currently requiring 3 L of oxygen by nasal cannula to maintain O2 saturation greater than 90% Repeat chest x-ray Home O2 evaluation 04/15/2019: SATURATION QUALIFICATIONS: (This note is used to comply with regulatory documentation for home oxygen)  Patient Saturations on Room Air at Rest = 82%  Patient saturation 90% on 2L at rest  Patient Saturations on 3 Liters of oxygen while Ambulating = 91%  Please briefly explain why patient needs home oxygen: Pt requires 2L of oxygen at rest to maintain 90% saturation and 3L with gait to maintain 91%  Pulmonary edema with bilateral pleural effusion likely  cardiogenic Lasix on hold due to AKI  Worsening AKI on CKD 3 Creatinine at baseline 1.40 with GFR 39 Presented with creatinine of 1.57 Creatinine 1.78 >> 1.96 Continue to avoid nephrotoxins Holding off diuretics for now Repeat BMP in the morning  Essential hypertension Slightly uncontrolled labile with soft blood pressures in between -Continue amlodipine, hydralazine  Hypothyroidism -Continue Synthroid  Hyperlipidemia -Continue Lipitor  Diabetes mellitus, type 2 Not on outpatient treatment. Last hemoglobin A1C of 5.5% Avoid hypoglycemia  Symptomatic anemia/iron deficiency anemia Hemoglobin dropped to 7.2 morning from 7.5 No sign of overt bleeding Obtain FOBT Last iron studies with iron deficiency Start ferrous sulfate 325 mg daily Transfuse 1 unit PRBC Repeat H&H  Ambulatory dysfunction/physical debility PT recommended home health PT with 24-hour supervision Patient lives alone, declines SNF Fall precautions  Severe protein calorie malnutrition BMI 17 Severe muscle depletion Severe fat depletion Continue oral supplement  DVT prophylaxis: SCDs subcu Lovenox daily Code Status:   Code Status: Full Code Family Communication: None Disposition Plan:  Possible discharge to home with home health services tomorrow 04/16/2019 once family member can stay with the patient 24/7.  She has a son and a niece.  Son, [place], [phone].   Consultants:   None  Procedures:   None  Antimicrobials:  None     Objective: Vitals:   04/15/19 0027 04/15/19 0527 04/15/19 0700 04/15/19 1314  BP: (!) 161/35 (!) 145/29  (!) 169/34  Pulse: 75 70  67  Resp: 18   16  Temp: 99.2 F (37.3 C) 99.3 F (37.4 C)  97.9 F (36.6 C)  TempSrc: Oral Oral  Oral  SpO2:  92% 96%  98%  Weight:   50.8 kg   Height:        Intake/Output Summary (Last 24 hours) at 04/15/2019 1318 Last data filed at 04/15/2019 0900 Gross per 24 hour  Intake 240 ml  Output 500 ml  Net -260 ml    Filed Weights   04/13/19 0539 04/14/19 0513 04/15/19 0700  Weight: 51.1 kg 51.8 kg 50.8 kg    Exam:  . General: 85 y.o. year-old female Emaciated.  Alert and oriented x3.  In no acute distress. . Cardiovascular: Regular rate and rhythm.  No rubs or gallops.  No JVD or thyromegaly . Respiratory: Mild rales at bases with no wheezes.  Poor inspiratory effort. . Abdomen: Soft nontender nondistended normal bowel sounds present. . Musculoskeletal: No lower extremity edema.  2 out of 4 pulses in all 4 extremities. Marland Kitchen Psychiatry: Mood is appropriate for condition  Data Reviewed: CBC: Recent Labs  Lab 04/11/19 1218 04/12/19 0816 04/13/19 0859 04/15/19 0616  WBC 6.9 7.6 5.9 5.2  NEUTROABS 5.4  --   --   --   HGB 9.1* 8.6* 7.5* 7.2*  HCT 28.8* 26.9* 24.0* 23.0*  MCV 100.0 100.0 100.8* 100.4*  PLT 265 266 217 213   Basic Metabolic Panel: Recent Labs  Lab 04/11/19 1127 04/12/19 0816 04/13/19 0859 04/15/19 0616  NA 140 141 141 138  K 3.8 3.4* 3.6 4.3  CL 114* 109 111 106  CO2 18* 22 22 24   GLUCOSE 139* 117* 99 101*  BUN 46* 44* 46* 49*  CREATININE 1.57* 1.61* 1.78* 1.96*  CALCIUM 8.7* 8.8* 8.3* 8.7*  MG  --   --   --  2.1  PHOS  --   --   --  3.4   GFR: Estimated Creatinine Clearance: 16.2 mL/min (A) (by C-G formula based on SCr of 1.96 mg/dL (H)). Liver Function Tests: Recent Labs  Lab 04/11/19 1127  AST 20  ALT 14  ALKPHOS 52  BILITOT 0.7  PROT 6.5  ALBUMIN 3.7   No results for input(s): LIPASE, AMYLASE in the last 168 hours. No results for input(s): AMMONIA in the last 168 hours. Coagulation Profile: No results for input(s): INR, PROTIME in the last 168 hours. Cardiac Enzymes: No results for input(s): CKTOTAL, CKMB, CKMBINDEX, TROPONINI in the last 168 hours. BNP (last 3 results) No results for input(s): PROBNP in the last 8760 hours. HbA1C: No results for input(s): HGBA1C in the last 72 hours. CBG: No results for input(s): GLUCAP in the last 168 hours.  Lipid Profile: No results for input(s): CHOL, HDL, LDLCALC, TRIG, CHOLHDL, LDLDIRECT in the last 72 hours. Thyroid Function Tests: No results for input(s): TSH, T4TOTAL, FREET4, T3FREE, THYROIDAB in the last 72 hours. Anemia Panel: No results for input(s): VITAMINB12, FOLATE, FERRITIN, TIBC, IRON, RETICCTPCT in the last 72 hours. Urine analysis:    Component Value Date/Time   COLORURINE YELLOW 03/26/2019 2253   APPEARANCEUR CLEAR 03/26/2019 2253   LABSPEC 1.011 03/26/2019 2253   PHURINE 5.0 03/26/2019 2253   GLUCOSEU NEGATIVE 03/26/2019 2253   GLUCOSEU NEG mg/dL 02/20/2009 2049   HGBUR SMALL (A) 03/26/2019 2253   BILIRUBINUR NEGATIVE 03/26/2019 2253   BILIRUBINUR neg 05/06/2018 1200   KETONESUR NEGATIVE 03/26/2019 2253   PROTEINUR 100 (A) 03/26/2019 2253   UROBILINOGEN 0.2 05/06/2018 1200   UROBILINOGEN 0.2 04/25/2014 1023   NITRITE NEGATIVE 03/26/2019 2253   LEUKOCYTESUR NEGATIVE 03/26/2019 2253   Sepsis Labs: @LABRCNTIP (procalcitonin:4,lacticidven:4)  ) Recent Results (from the past 240  hour(s))  SARS Coronavirus 2 (CEPHEID- Performed in [place] hospital lab), Hosp Order     Status: None   Collection Time: 04/11/19 11:27 AM   Specimen: Nasopharyngeal Swab  Result Value Ref Range Status   SARS Coronavirus 2 NEGATIVE NEGATIVE Final    Comment: (NOTE) If result is NEGATIVE SARS-CoV-2 target nucleic acids are NOT DETECTED. The SARS-CoV-2 RNA is generally detectable in upper and lower  respiratory specimens during the acute phase of infection. The lowest  concentration of SARS-CoV-2 viral copies this assay can detect is 250  copies / mL. A negative result does not preclude SARS-CoV-2 infection  and should not be used as the sole basis for treatment or other  patient management decisions.  A negative result may occur with  improper specimen collection / handling, submission of specimen other  than nasopharyngeal swab, presence of viral mutation(s) within the  areas  targeted by this assay, and inadequate number of viral copies  (<250 copies / mL). A negative result must be combined with clinical  observations, patient history, and epidemiological information. If result is POSITIVE SARS-CoV-2 target nucleic acids are DETECTED. The SARS-CoV-2 RNA is generally detectable in upper and lower  respiratory specimens dur ing the acute phase of infection.  Positive  results are indicative of active infection with SARS-CoV-2.  Clinical  correlation with patient history and other diagnostic information is  necessary to determine patient infection status.  Positive results do  not rule out bacterial infection or co-infection with other viruses. If result is PRESUMPTIVE POSTIVE SARS-CoV-2 nucleic acids MAY BE PRESENT.   A presumptive positive result was obtained on the submitted specimen  and confirmed on repeat testing.  While 2019 novel coronavirus  (SARS-CoV-2) nucleic acids may be present in the submitted sample  additional confirmatory testing may be necessary for epidemiological  and / or clinical management purposes  to differentiate between  SARS-CoV-2 and other Sarbecovirus currently known to infect humans.  If clinically indicated additional testing with an alternate test  methodology [phone]) is advised. The SARS-CoV-2 RNA is generally  detectable in upper and lower respiratory sp ecimens during the acute  phase of infection. The expected result is Negative. Fact Sheet for Patients:  StrictlyIdeas.no Fact Sheet for Healthcare Providers: BankingDealers.co.za This test is not yet approved or cleared by the [place] FDA and has been authorized for detection and/or diagnosis of SARS-CoV-2 by FDA under an Emergency Use Authorization (EUA).  This EUA will remain in effect (meaning this test can be used) for the duration of the COVID-19 declaration under Section 564(b)(1) of the Act, 21 U.S.C. section  360bbb-3(b)(1), unless the authorization is terminated or revoked sooner. Performed at [facility], [place] [address], [place], [place] [address]   Gram stain     Status: None   Collection Time: 04/12/19  3:40 PM   Specimen: Pleural, Right; Pleural Fluid  Result Value Ref Range Status   Specimen Description FLUID RIGHT PLEURAL  Final   Special Requests NONE  Final   Gram Stain   Final    WBC PRESENT,BOTH PMN AND MONONUCLEAR NO ORGANISMS SEEN CYTOSPIN SMEAR Performed at [facility], [place] [address], [place],  [address]    Report Status 04/12/2019 FINAL  Final  Culture, body fluid-bottle     Status: None (Preliminary result)   Collection Time: 04/12/19  3:40 PM   Specimen: Fluid  Result Value Ref Range Status   Specimen Description FLUID PLEURAL RIGHT  Final  Special Requests BOTTLES DRAWN AEROBIC AND ANAEROBIC  Final   Culture   Final    NO GROWTH 2 DAYS Performed at [facility], [place] [address] [place], [place] [address]    Report Status PENDING  Incomplete      Studies: No results found.  Scheduled Meds: . amLODipine  10 mg Oral Daily  . atorvastatin  20 mg Oral q1800  . calcium carbonate  1 tablet Oral Q breakfast  . enoxaparin (LOVENOX) injection  30 mg Subcutaneous Q24H  . feeding supplement (ENSURE ENLIVE)  237 mL Oral BID BM  . ferrous sulfate  325 mg Oral Daily  . hydrALAZINE  25 mg Oral TID  . lactose free nutrition  237 mL Oral TID WC  . latanoprost  1 drop Both Eyes QHS  . levothyroxine  112 mcg Oral Daily  . linaclotide  145 mcg Oral QAC breakfast  . mirtazapine  7.5 mg Oral QHS  . multivitamin with minerals  1 tablet Oral Daily  . polyethylene glycol  17 g Oral Daily  . vitamin B-12  500 mcg Oral Daily  . vitamin E  400 Units Oral Daily    Continuous Infusions:   LOS: 4 days     Kayleen Memos, MD [facility] Pager [phone]  If 7PM-7AM, please contact night-coverage [email or web address] Password [email or web address] 04/15/2019,  1:18 PM

## 2019-04-15 NOTE — Progress Notes (Signed)
SATURATION QUALIFICATIONS: (This note is used to comply with regulatory documentation for home oxygen)  Patient Saturations on Room Air at Rest = 82%  Patient saturation 90% on 2L at rest  Patient Saturations on 3 Liters of oxygen while Ambulating = 91%  Please briefly explain why patient needs home oxygen: Pt requires 2L of oxygen at rest to maintain 90% saturation and 3L with gait to maintain 91% [place] Pager: [phone] Office: [phone]

## 2019-04-16 LAB — TYPE AND SCREEN
ABO/RH(D): O POS
Antibody Screen: NEGATIVE
Unit division: 0

## 2019-04-16 LAB — BPAM RBC
Blood Product Expiration Date: 202008152359
ISSUE DATE / TIME: 202007161705
Unit Type and Rh: 5100

## 2019-04-16 LAB — GLUCOSE, CAPILLARY
Glucose-Capillary: 145 mg/dL — ABNORMAL HIGH (ref 70–99)
Glucose-Capillary: 114 mg/dL — ABNORMAL HIGH (ref 70–99)

## 2019-04-16 LAB — BASIC METABOLIC PANEL
Anion gap: 7 (ref 5–15)
BUN: 49 mg/dL — ABNORMAL HIGH (ref 8–23)
CO2: 24 mmol/L (ref 22–32)
Calcium: 8.5 mg/dL — ABNORMAL LOW (ref 8.9–10.3)
Chloride: 106 mmol/L (ref 98–111)
Creatinine, Ser: 1.73 mg/dL — ABNORMAL HIGH (ref 0.44–1.00)
GFR calc Af Amer: 30 mL/min — ABNORMAL LOW (ref >60)
GFR calc non Af Amer: 26 mL/min — ABNORMAL LOW (ref >60)
Glucose, Bld: 115 mg/dL — ABNORMAL HIGH (ref 70–99)
Potassium: 4 mmol/L (ref 3.5–5.1)
Sodium: 137 mmol/L (ref 135–145)

## 2019-04-16 LAB — HEMOGLOBIN AND HEMATOCRIT, BLOOD
HCT: 28.1 % — ABNORMAL LOW (ref 36.0–46.0)
Hemoglobin: 9 g/dL — ABNORMAL LOW (ref 12.0–15.0)

## 2019-04-16 MED ORDER — IPRATROPIUM-ALBUTEROL 0.5-2.5 (3) MG/3ML IN SOLN: 3.0000 mL | Freq: Four times a day (QID) | RESPIRATORY_TRACT | Status: DC | PRN

## 2019-04-16 NOTE — Progress Notes (Signed)
Patient not ready to take morning medications.Patient asked that they be brought back in later.Will notify next shift.

## 2019-04-16 NOTE — Progress Notes (Signed)
Occupational Therapy Treatment Patient Details Name: CHARLENA HAUB MRN: [id number] DOB: 1929/04/09 Today's Date: 04/16/2019    History of present illness Pt is an 85 y/o female admitted secondary to worsening SOB and edema. PMH includes HTN, PVD, DM, CKD, and dCHF, and PVD.    OT comments  Pt. Seen for skilled OT tx session.  Pt. Able to complete bed mobility, toileting and grooming tasks with S.  Intermittent cues for rw management and o2 tubing management.    Follow Up Recommendations  Home health OT;Supervision - Intermittent    Equipment Recommendations  None recommended by OT    Recommendations for Other Services      Precautions / Restrictions Precautions Precautions: Fall Precaution Comments: watch sats Restrictions Weight Bearing Restrictions: No       Mobility Bed Mobility Overal bed mobility: Modified Independent                Transfers Overall transfer level: Modified independent Equipment used: Standard walker Transfers: Sit to/from Stand;Stand Pivot Transfers Sit to Stand: Modified independent (Device/Increase time) Stand pivot transfers: Modified independent (Device/Increase time)            Balance                                           ADL either performed or assessed with clinical judgement   ADL Overall ADL's : Needs assistance/impaired     Grooming: Wash/dry hands;Standing;Supervision/safety Grooming Details (indicate cue type and reason): cues for rw placement at sink counter                 Toilet Transfer: Supervision/safety;Ambulation;RW   Toileting- Water quality scientist and Hygiene: Min guard;Sit to/from stand;Sitting/lateral lean       Functional mobility during ADLs: Supervision/safety;Rolling walker General ADL Comments: pt limited by generalized weakness, decreased act tolerance     Vision       Perception     Praxis      Cognition Arousal/Alertness: Awake/alert Behavior  During Therapy: WFL for tasks assessed/performed Overall Cognitive Status: No family/caregiver present to determine baseline cognitive functioning                                          Exercises     Shoulder Instructions       General Comments      Pertinent Vitals/ Pain       Pain Assessment: No/denies pain  Home Living                                          Prior Functioning/Environment              Frequency  Min 2X/week        Progress Toward Goals  OT Goals(current goals can now be found in the care plan section)  Progress towards OT goals: Progressing toward goals     Plan Frequency remains appropriate;Discharge plan needs to be updated    Co-evaluation                 AM-PAC OT "6 Clicks" Daily Activity     Outcome Measure   Help from another person eating meals?:  None Help from another person taking care of personal grooming?: A Little Help from another person toileting, which includes using toliet, bedpan, or urinal?: A Little Help from another person bathing (including washing, rinsing, drying)?: A Little Help from another person to put on and taking off regular upper body clothing?: A Little Help from another person to put on and taking off regular lower body clothing?: A Little 6 Click Score: 19    End of Session Equipment Utilized During Treatment: Rolling walker;Oxygen      Activity Tolerance Patient tolerated treatment well   Patient Left in chair;with call bell/phone within reach;with chair alarm set   Nurse Communication          Time: [phone] OT Time Calculation (min): 17 min  Charges: OT General Charges $OT Visit: 1 Visit OT Treatments $Self Care/Home Management : 8-22 mins  Janice Coffin, COTA/L 04/16/2019, 9:08 AM

## 2019-04-16 NOTE — Progress Notes (Signed)
Updated the patient's niece, Ms. Cathrine Muster, via phone. She requested for case management to contact her for dc planning. The writer was unable to reach the patient's son.   All questions answered to her satisfaction.

## 2019-04-16 NOTE — Care Management Important Message (Signed)
Important Message  Patient Details  Name: Gina [name] MRN: [id number] Date of Birth: [birth year]   Medicare Important Message Given:  Yes     [name] 04/16/2019, 11:57 AM

## 2019-04-16 NOTE — Progress Notes (Signed)
PROGRESS NOTE  MEGAN PRESTI HDQ:[id number] DOB: Nov 05, 1928 DOA: 04/11/2019 PCP: Clent Demark, PA-C  HPI/Recap of past 24 hours: Gina [name] is a 85 y.o. femalewith medical history significant ofdiastolic heart failure, hypertension,CKD stage 3b,type 2 diabetes mellitus, hypothyroidism and peripheral vascular disease. Patient presented secondary to dyspnea and found to have an acute heart failure episode. IV diuresis started then [facility] due to AKI. Hospital course complicated by generalized weakness and acute hypoxic respiratory failure, not on oxygen supplementation at baseline.  For medical evaluation with requirement for 3 L of oxygen by nasal cannula to maintain O2 saturation greater than 90%.  PT recommended home health PT with 24-hour assistance, the patient lives alone.  04/16/19: Patient was seen and examined at her bedside.  She has no new complaints.  Weakness persist she will require physical therapy at discharge.  T-max 99.7.  Denies any new symptoms, chest pain, dyspnea at rest, abdominal pain, nausea, or dysuria.   Assessment/Plan: Principal Problem:   Heart failure (HCC) Active Problems:   Essential hypertension   PVD (peripheral vascular disease) (HCC)   Diabetes mellitus, type 2 (HCC)   CKD (chronic kidney disease) stage 3, GFR 30-59 ml/min (HCC)  Acute on chronic diastolic CHF 2D echo done on 03/20/2019 showed EF 60 to 65% Elevated BNP on admission Started on IV Lasix, discontinued due to AKI  Persistent acute hypoxic respiratory failure likely secondary to acute on chronic diastolic CHF Not on oxygen supplementation at baseline. Currently requiring 3 L of oxygen by nasal cannula to maintain O2 saturation greater than 90% Repeat chest x-ray Home O2 evaluation 04/15/2019: SATURATION QUALIFICATIONS: (This note is used to comply with regulatory documentation for home oxygen)  Patient Saturations on Room Air at Rest = 82%  Patient saturation 90% on 2L  at rest  Patient Saturations on 3 Liters of oxygen while Ambulating = 91%  Please briefly explain why patient needs home oxygen: Pt requires 2L of oxygen at rest to maintain 90% saturation and 3L with gait to maintain 91%  Symptomatic anemia/iron deficiency anemia/chronic macrocytic anemia Hemoglobin 7.2 with MCV 100.4 Transfuse 1 unit PRBC Repeated H&H 9.0/28.1 FOBT pending No sign of overt bleeding Iron studies on 01/14/2019 remarkable for iron deficiency Start ferrous sulfate 325 mg daily  Pulmonary edema with bilateral pleural effusion likely cardiogenic Lasix on hold due to AKI   AKI on CKD 3 Creatinine at baseline 1.40 with GFR 39 Presented with creatinine of 1.57 Creatinine 1.78 >> 1.96>>1.73 Continue to avoid nephrotoxins Holding off diuretics due to AKI Repeat BMP in the morning  Essential hypertension Slightly uncontrolled labile with systolic blood pressures in between.   Continue amlodipine and hydralazine.    Hypothyroidism -Continue Synthroid  Hyperlipidemia -Continue Lipitor  Diabetes mellitus, type 2 Not on outpatient treatment. Last hemoglobin A1C of 5.5% Avoid hypoglycemia  Symptomatic anemia/iron deficiency anemia Hemoglobin dropped to 7.2 morning from 7.5 No sign of overt bleeding Obtain FOBT Last iron studies with iron deficiency Start ferrous sulfate 325 mg daily Transfuse 1 unit PRBC Repeat H&H  Ambulatory dysfunction/physical debility PT recommended home health PT with 24-hour supervision Patient lives alone, declines SNF Fall precautions  Severe protein calorie malnutrition BMI 17 Severe muscle depletion Severe fat depletion Continue oral supplement  DVT prophylaxis: SCDs subcu Lovenox daily Code Status:   Code Status: Full Code Family Communication:  Called her son on 04/15/2019.  Will call again. Disposition Plan:  Possible discharge to home with home health services tomorrow 04/17/2019 once family  member can stay with the  patient 24/7.  She lives alone and required 24-hour supervision per PTs assessment.  She has a son and a niece.  Son, [place], [phone].   Consultants:   None  Procedures:   None  Antimicrobials:  None     Objective: Vitals:   04/16/19 0255 04/16/19 0500 04/16/19 0831 04/16/19 0850  BP:  (!) 164/39  (!) 163/29  Pulse: 70 76  72  Resp: 16 18    Temp:  99.7 F (37.6 C)    TempSrc:  Oral    SpO2: 98% 91% 95%   Weight:  52.3 kg    Height:        Intake/Output Summary (Last 24 hours) at 04/16/2019 1053 Last data filed at 04/16/2019 1048 Gross per 24 hour  Intake 936 ml  Output 400 ml  Net 536 ml   Filed Weights   04/14/19 0513 04/15/19 0700 04/16/19 0500  Weight: 51.8 kg 50.8 kg 52.3 kg    Exam:  . General: 85 y.o. year-old female Emaciated.  Alert and interactive.  No acute distress. . Cardiovascular: Regular rate and rhythm.  No rubs or gallops.  No JVD or thyromegaly noted.   Marland Kitchen Respiratory: Mild rales at bases no wheezes noted.  Poor inspiratory effort. . Abdomen: Soft nontender nondistended with normal bowel sounds x4 quadrant. . Musculoskeletal: No lower extremity edema.  2 out of 4 pulses in all 4 extremities.  Marland Kitchen Psychiatry: Mood is appropriate for condition and setting.  Data Reviewed: CBC: Recent Labs  Lab 04/11/19 1218 04/12/19 0816 04/13/19 0859 04/15/19 0616 04/16/19 0549  WBC 6.9 7.6 5.9 5.2  --   NEUTROABS 5.4  --   --   --   --   HGB 9.1* 8.6* 7.5* 7.2* 9.0*  HCT 28.8* 26.9* 24.0* 23.0* 28.1*  MCV 100.0 100.0 100.8* 100.4*  --   PLT 265 266 217 203  --    Basic Metabolic Panel: Recent Labs  Lab 04/11/19 1127 04/12/19 0816 04/13/19 0859 04/15/19 0616 04/16/19 0238  NA 140 141 141 138 137  K 3.8 3.4* 3.6 4.3 4.0  CL 114* 109 111 106 106  CO2 18* 22 22 24 24   GLUCOSE 139* 117* 99 101* 115*  BUN 46* 44* 46* 49* 49*  CREATININE 1.57* 1.61* 1.78* 1.96* 1.73*  CALCIUM 8.7* 8.8* 8.3* 8.7* 8.5*  MG  --   --   --  2.1  --    PHOS  --   --   --  3.4  --    GFR: Estimated Creatinine Clearance: 18.9 mL/min (A) (by C-G formula based on SCr of 1.73 mg/dL (H)). Liver Function Tests: Recent Labs  Lab 04/11/19 1127  AST 20  ALT 14  ALKPHOS 52  BILITOT 0.7  PROT 6.5  ALBUMIN 3.7   No results for input(s): LIPASE, AMYLASE in the last 168 hours. No results for input(s): AMMONIA in the last 168 hours. Coagulation Profile: No results for input(s): INR, PROTIME in the last 168 hours. Cardiac Enzymes: No results for input(s): CKTOTAL, CKMB, CKMBINDEX, TROPONINI in the last 168 hours. BNP (last 3 results) No results for input(s): PROBNP in the last 8760 hours. HbA1C: No results for input(s): HGBA1C in the last 72 hours. CBG: No results for input(s): GLUCAP in the last 168 hours. Lipid Profile: No results for input(s): CHOL, HDL, LDLCALC, TRIG, CHOLHDL, LDLDIRECT in the last 72 hours. Thyroid Function Tests: No results for input(s): TSH, T4TOTAL, FREET4, T3FREE, THYROIDAB  in the last 72 hours. Anemia Panel: No results for input(s): VITAMINB12, FOLATE, FERRITIN, TIBC, IRON, RETICCTPCT in the last 72 hours. Urine analysis:    Component Value Date/Time   COLORURINE YELLOW 03/26/2019 2253   APPEARANCEUR CLEAR 03/26/2019 2253   LABSPEC 1.011 03/26/2019 2253   PHURINE 5.0 03/26/2019 2253   GLUCOSEU NEGATIVE 03/26/2019 2253   GLUCOSEU NEG mg/dL 02/20/2009 2049   HGBUR SMALL (A) 03/26/2019 2253   BILIRUBINUR NEGATIVE 03/26/2019 2253   BILIRUBINUR neg 05/06/2018 1200   KETONESUR NEGATIVE 03/26/2019 2253   PROTEINUR 100 (A) 03/26/2019 2253   UROBILINOGEN 0.2 05/06/2018 1200   UROBILINOGEN 0.2 04/25/2014 1023   NITRITE NEGATIVE 03/26/2019 2253   LEUKOCYTESUR NEGATIVE 03/26/2019 2253   Sepsis Labs: @LABRCNTIP (procalcitonin:4,lacticidven:4)  ) Recent Results (from the past 240 hour(s))  SARS Coronavirus 2 (CEPHEID- Performed in [place] hospital lab), Hosp Order     Status: None   Collection Time:  04/11/19 11:27 AM   Specimen: Nasopharyngeal Swab  Result Value Ref Range Status   SARS Coronavirus 2 NEGATIVE NEGATIVE Final    Comment: (NOTE) If result is NEGATIVE SARS-CoV-2 target nucleic acids are NOT DETECTED. The SARS-CoV-2 RNA is generally detectable in upper and lower  respiratory specimens during the acute phase of infection. The lowest  concentration of SARS-CoV-2 viral copies this assay can detect is 250  copies / mL. A negative result does not preclude SARS-CoV-2 infection  and should not be used as the sole basis for treatment or other  patient management decisions.  A negative result may occur with  improper specimen collection / handling, submission of specimen other  than nasopharyngeal swab, presence of viral mutation(s) within the  areas targeted by this assay, and inadequate number of viral copies  (<250 copies / mL). A negative result must be combined with clinical  observations, patient history, and epidemiological information. If result is POSITIVE SARS-CoV-2 target nucleic acids are DETECTED. The SARS-CoV-2 RNA is generally detectable in upper and lower  respiratory specimens dur ing the acute phase of infection.  Positive  results are indicative of active infection with SARS-CoV-2.  Clinical  correlation with patient history and other diagnostic information is  necessary to determine patient infection status.  Positive results do  not rule out bacterial infection or co-infection with other viruses. If result is PRESUMPTIVE POSTIVE SARS-CoV-2 nucleic acids MAY BE PRESENT.   A presumptive positive result was obtained on the submitted specimen  and confirmed on repeat testing.  While 2019 novel coronavirus  (SARS-CoV-2) nucleic acids may be present in the submitted sample  additional confirmatory testing may be necessary for epidemiological  and / or clinical management purposes  to differentiate between  SARS-CoV-2 and other Sarbecovirus currently known to  infect humans.  If clinically indicated additional testing with an alternate test  methodology [phone]) is advised. The SARS-CoV-2 RNA is generally  detectable in upper and lower respiratory sp ecimens during the acute  phase of infection. The expected result is Negative. Fact Sheet for Patients:  StrictlyIdeas.no Fact Sheet for Healthcare Providers: BankingDealers.co.za This test is not yet approved or cleared by the [place] FDA and has been authorized for detection and/or diagnosis of SARS-CoV-2 by FDA under an Emergency Use Authorization (EUA).  This EUA will remain in effect (meaning this test can be used) for the duration of the COVID-19 declaration under Section 564(b)(1) of the Act, 21 U.S.C. section 360bbb-3(b)(1), unless the authorization is terminated or revoked sooner. Performed at [facility],  [address] [place], [place] [address]   Gram stain     Status: None   Collection Time: 04/12/19  3:40 PM   Specimen: Pleural, Right; Pleural Fluid  Result Value Ref Range Status   Specimen Description FLUID RIGHT PLEURAL  Final   Special Requests NONE  Final   Gram Stain   Final    WBC PRESENT,BOTH PMN AND MONONUCLEAR NO ORGANISMS SEEN CYTOSPIN SMEAR Performed at [facility], [place] [address] [place], [place] [address]    Report Status 04/12/2019 FINAL  Final  Culture, body fluid-bottle     Status: None (Preliminary result)   Collection Time: 04/12/19  3:40 PM   Specimen: Fluid  Result Value Ref Range Status   Specimen Description FLUID PLEURAL RIGHT  Final   Special Requests BOTTLES DRAWN AEROBIC AND ANAEROBIC  Final   Culture   Final    NO GROWTH 3 DAYS Performed at [facility], [place] [address] [place], [place] [address]    Report Status PENDING  Incomplete      Studies: No results found.  Scheduled Meds: . amLODipine  10 mg Oral Daily  . atorvastatin  20 mg Oral q1800  . calcium  carbonate  1 tablet Oral Q breakfast  . enoxaparin (LOVENOX) injection  30 mg Subcutaneous Q24H  . feeding supplement (ENSURE ENLIVE)  237 mL Oral BID BM  . ferrous sulfate  325 mg Oral Daily  . hydrALAZINE  25 mg Oral TID  . ipratropium-albuterol  3 mL Nebulization Q6H  . lactose free nutrition  237 mL Oral TID WC  . latanoprost  1 drop Both Eyes QHS  . levothyroxine  112 mcg Oral Daily  . linaclotide  145 mcg Oral QAC breakfast  . mirtazapine  7.5 mg Oral QHS  . multivitamin with minerals  1 tablet Oral Daily  . polyethylene glycol  17 g Oral Daily  . vitamin B-12  500 mcg Oral Daily  . vitamin E  400 Units Oral Daily    Continuous Infusions:   LOS: 5 days     Kayleen Memos, MD [facility] Pager [phone]  If 7PM-7AM, please contact night-coverage [email or web address] Password [facility] 04/16/2019, 10:53 AM

## 2019-04-17 ENCOUNTER — Inpatient Hospital Stay (HOSPITAL_COMMUNITY): Payer: Medicare Other

## 2019-04-17 DIAGNOSIS — N179 Acute kidney failure, unspecified: Secondary | ICD-10-CM

## 2019-04-17 DIAGNOSIS — J90 Pleural effusion, not elsewhere classified: Secondary | ICD-10-CM

## 2019-04-17 LAB — BASIC METABOLIC PANEL
Anion gap: 8 (ref 5–15)
BUN: 54 mg/dL — ABNORMAL HIGH (ref 8–23)
CO2: 22 mmol/L (ref 22–32)
Calcium: 8.5 mg/dL — ABNORMAL LOW (ref 8.9–10.3)
Chloride: 105 mmol/L (ref 98–111)
Creatinine, Ser: 1.68 mg/dL — ABNORMAL HIGH (ref 0.44–1.00)
GFR calc Af Amer: 31 mL/min — ABNORMAL LOW (ref >60)
GFR calc non Af Amer: 27 mL/min — ABNORMAL LOW (ref >60)
Glucose, Bld: 136 mg/dL — ABNORMAL HIGH (ref 70–99)
Potassium: 4.3 mmol/L (ref 3.5–5.1)
Sodium: 135 mmol/L (ref 135–145)

## 2019-04-17 LAB — CULTURE, BODY FLUID W GRAM STAIN -BOTTLE: Culture: NO GROWTH

## 2019-04-17 LAB — GLUCOSE, CAPILLARY
Glucose-Capillary: 121 mg/dL — ABNORMAL HIGH (ref 70–99)
Glucose-Capillary: 137 mg/dL — ABNORMAL HIGH (ref 70–99)

## 2019-04-17 LAB — MRSA PCR SCREENING: MRSA by PCR: NEGATIVE

## 2019-04-17 MED ORDER — FUROSEMIDE 40 MG PO TABS
40.0000 mg | ORAL_TABLET | Freq: Every day | ORAL | Status: DC
  Administered 2019-04-17 – 2019-04-20 (×4): 40 mg via ORAL
  Filled 2019-04-17 (×4): qty 1

## 2019-04-17 NOTE — Care Management (Signed)
Spoke w patient's niece to set up discharge as requested by MD. [facility] will continue through [facility]. Niece is agreeable and could not identify any additional needs for the patient at home. Oxygen delivered to room this morning for home use. No other CM needs. Signing off.

## 2019-04-17 NOTE — Progress Notes (Signed)
PROGRESS NOTE    Gina [name]  TDD:[id number]  DOB: 08-08-1929  DOA: 04/11/2019 PCP: Clent Demark, PA-C  Brief Narrative:  85 y.o.femalewith medical history significant ofdiastolic heart failure, hypertension,CKD stage 3b,type 2 diabetes mellitus, hypothyroidism and peripheral vascular disease. Patient presented secondary to dyspnea and found to have an acute heart failure episode. IV diuresis started then [facility] due to AKI.Hospital course complicated by generalized weakness and acute hypoxic respiratory failure, not on oxygen supplementation at baseline. Home 02 evaluation with requirement for 3 L of oxygen by nasal cannula to maintain O2 saturation greater than 90%.  PT recommended home health PT with 24-hour assistance, the patient lives alone.  Subjective:  Patient resting comfortably.  Has home O2 bedside.  States her son travels a lot but niece lives close by.   Denie any chest pains.  Received 1 unit of PRBC for hemoglobin of 7.2 yesterday.  Repeat chest x-ray ordered for today Objective: Vitals:   04/16/19 2114 04/17/19 0627 04/17/19 0846 04/17/19 1731  BP:  (!) 159/32 (!) 159/56 (!) 165/74  Pulse:  82  74  Resp:  18    Temp:  98.7 F (37.1 C)    TempSrc:  Oral    SpO2: 95% (!) 88%    Weight:  52.3 kg    Height:        Intake/Output Summary (Last 24 hours) at 04/17/2019 1742 Last data filed at 04/17/2019 1731 Gross per 24 hour  Intake 717 ml  Output 750 ml  Net -33 ml   Filed Weights   04/15/19 0700 04/16/19 0500 04/17/19 0627  Weight: 50.8 kg 52.3 kg 52.3 kg    Physical Examination:  General exam: Appears calm and comfortable  Respiratory system: Decreased breath sounds at bases.  Respiratory effort normal. Cardiovascular system: S1 & S2 heard, RRR.No pedal edema. Gastrointestinal system: Abdomen is nondistended, soft and nontender. No organomegaly or masses felt. Normal bowel sounds heard. Central nervous system: Alert and oriented. No focal  neurological deficits. Extremities: Symmetric 5 x 5 power. Skin: No rashes, lesions or ulcers Psychiatry: Judgement and insight appear normal. Mood & affect appropriate.     Data Reviewed: I have personally reviewed following labs and imaging studies  CBC: Recent Labs  Lab 04/11/19 1218 04/12/19 0816 04/13/19 0859 04/15/19 0616 04/16/19 0549  WBC 6.9 7.6 5.9 5.2  --   NEUTROABS 5.4  --   --   --   --   HGB 9.1* 8.6* 7.5* 7.2* 9.0*  HCT 28.8* 26.9* 24.0* 23.0* 28.1*  MCV 100.0 100.0 100.8* 100.4*  --   PLT 265 266 217 203  --    Basic Metabolic Panel: Recent Labs  Lab 04/12/19 0816 04/13/19 0859 04/15/19 0616 04/16/19 0238 04/17/19 0405  NA 141 141 138 137 135  K 3.4* 3.6 4.3 4.0 4.3  CL 109 111 106 106 105  CO2 22 22 24 24 22   GLUCOSE 117* 99 101* 115* 136*  BUN 44* 46* 49* 49* 54*  CREATININE 1.61* 1.78* 1.96* 1.73* 1.68*  CALCIUM 8.8* 8.3* 8.7* 8.5* 8.5*  MG  --   --  2.1  --   --   PHOS  --   --  3.4  --   --    GFR: Estimated Creatinine Clearance: 19.5 mL/min (A) (by C-G formula based on SCr of 1.68 mg/dL (H)). Liver Function Tests: Recent Labs  Lab 04/11/19 1127  AST 20  ALT 14  ALKPHOS 52  BILITOT 0.7  PROT  6.5  ALBUMIN 3.7   No results for input(s): LIPASE, AMYLASE in the last 168 hours. No results for input(s): AMMONIA in the last 168 hours. Coagulation Profile: No results for input(s): INR, PROTIME in the last 168 hours. Cardiac Enzymes: No results for input(s): CKTOTAL, CKMB, CKMBINDEX, TROPONINI in the last 168 hours. BNP (last 3 results) No results for input(s): PROBNP in the last 8760 hours. HbA1C: No results for input(s): HGBA1C in the last 72 hours. CBG: Recent Labs  Lab 04/16/19 1613 04/16/19 2118 04/17/19 0625  GLUCAP 145* 114* 121*   Lipid Profile: No results for input(s): CHOL, HDL, LDLCALC, TRIG, CHOLHDL, LDLDIRECT in the last 72 hours. Thyroid Function Tests: No results for input(s): TSH, T4TOTAL, FREET4, T3FREE,  THYROIDAB in the last 72 hours. Anemia Panel: No results for input(s): VITAMINB12, FOLATE, FERRITIN, TIBC, IRON, RETICCTPCT in the last 72 hours. Sepsis Labs: No results for input(s): PROCALCITON, LATICACIDVEN in the last 168 hours.  Recent Results (from the past 240 hour(s))  SARS Coronavirus 2 (CEPHEID- Performed in [place] hospital lab), Hosp Order     Status: None   Collection Time: 04/11/19 11:27 AM   Specimen: Nasopharyngeal Swab  Result Value Ref Range Status   SARS Coronavirus 2 NEGATIVE NEGATIVE Final    Comment: (NOTE) If result is NEGATIVE SARS-CoV-2 target nucleic acids are NOT DETECTED. The SARS-CoV-2 RNA is generally detectable in upper and lower  respiratory specimens during the acute phase of infection. The lowest  concentration of SARS-CoV-2 viral copies this assay can detect is 250  copies / mL. A negative result does not preclude SARS-CoV-2 infection  and should not be used as the sole basis for treatment or other  patient management decisions.  A negative result may occur with  improper specimen collection / handling, submission of specimen other  than nasopharyngeal swab, presence of viral mutation(s) within the  areas targeted by this assay, and inadequate number of viral copies  (<250 copies / mL). A negative result must be combined with clinical  observations, patient history, and epidemiological information. If result is POSITIVE SARS-CoV-2 target nucleic acids are DETECTED. The SARS-CoV-2 RNA is generally detectable in upper and lower  respiratory specimens dur ing the acute phase of infection.  Positive  results are indicative of active infection with SARS-CoV-2.  Clinical  correlation with patient history and other diagnostic information is  necessary to determine patient infection status.  Positive results do  not rule out bacterial infection or co-infection with other viruses. If result is PRESUMPTIVE POSTIVE SARS-CoV-2 nucleic acids MAY BE  PRESENT.   A presumptive positive result was obtained on the submitted specimen  and confirmed on repeat testing.  While 2019 novel coronavirus  (SARS-CoV-2) nucleic acids may be present in the submitted sample  additional confirmatory testing may be necessary for epidemiological  and / or clinical management purposes  to differentiate between  SARS-CoV-2 and other Sarbecovirus currently known to infect humans.  If clinically indicated additional testing with an alternate test  methodology [id number]) is advised. The SARS-CoV-2 RNA is generally  detectable in upper and lower respiratory sp ecimens during the acute  phase of infection. The expected result is Negative. Fact Sheet for Patients:  StrictlyIdeas.no Fact Sheet for Healthcare Providers: BankingDealers.co.za This test is not yet approved or cleared by the [place] FDA and has been authorized for detection and/or diagnosis of SARS-CoV-2 by FDA under an Emergency Use Authorization (EUA).  This EUA will remain in effect (meaning this test can  be used) for the duration of the COVID-19 declaration under Section 564(b)(1) of the Act, 21 U.S.C. section 360bbb-3(b)(1), unless the authorization is terminated or revoked sooner. Performed at [facility], [place] [address] [place], [place] [address]   Gram stain     Status: None   Collection Time: 04/12/19  3:40 PM   Specimen: Pleural, Right; Pleural Fluid  Result Value Ref Range Status   Specimen Description FLUID RIGHT PLEURAL  Final   Special Requests NONE  Final   Gram Stain   Final    WBC PRESENT,BOTH PMN AND MONONUCLEAR NO ORGANISMS SEEN CYTOSPIN SMEAR Performed at [facility], [place] [address], [place], [place] [address]    Report Status 04/12/2019 FINAL  Final  Culture, body fluid-bottle     Status: None   Collection Time: 04/12/19  3:40 PM   Specimen: Fluid  Result Value Ref Range Status   Specimen Description  FLUID PLEURAL RIGHT  Final   Special Requests BOTTLES DRAWN AEROBIC AND ANAEROBIC  Final   Culture   Final    NO GROWTH 5 DAYS Performed at [facility], [place] [address], [place], [place] [address]    Report Status 04/17/2019 FINAL  Final  MRSA PCR Screening     Status: None   Collection Time: 04/17/19  6:27 AM   Specimen: Nasopharyngeal Wash  Result Value Ref Range Status   MRSA by PCR NEGATIVE NEGATIVE Final    Comment:        The GeneXpert MRSA Assay (FDA approved for NASAL specimens only), is one component of a comprehensive MRSA colonization surveillance program. It is not intended to diagnose MRSA infection nor to guide or monitor treatment for MRSA infections. Performed at [facility], [place] [address] [place], [place] [address]       Radiology Studies: Dg Chest Port 1 View  Result Date: 04/17/2019 CLINICAL DATA:  Follow-up pleural effusion. Congestive heart failure. EXAM: PORTABLE CHEST 1 VIEW COMPARISON:  04/12/2019 FINDINGS: Artifact overlies the chest. There are bilateral pleural effusions, larger on the left than the right, which have enlarged since the study of 5 days ago. There is pulmonary venous hypertension with mild interstitial edema. No acute bone finding. IMPRESSION: Worsened congestive heart failure pattern. Enlarged effusions, more on the left than the right, with basilar atelectasis. Electronically Signed   By: Nelson Chimes M.D.   On: 04/17/2019 10:27        Scheduled Meds: . amLODipine  10 mg Oral Daily  . atorvastatin  20 mg Oral q1800  . calcium carbonate  1 tablet Oral Q breakfast  . enoxaparin (LOVENOX) injection  30 mg Subcutaneous Q24H  . feeding supplement (ENSURE ENLIVE)  237 mL Oral BID BM  . ferrous sulfate  325 mg Oral Daily  . hydrALAZINE  25 mg Oral TID  . lactose free nutrition  237 mL Oral TID WC  . latanoprost  1 drop Both Eyes QHS  . levothyroxine  112 mcg Oral Daily  . linaclotide  145 mcg Oral QAC breakfast  .  mirtazapine  7.5 mg Oral QHS  . multivitamin with minerals  1 tablet Oral Daily  . polyethylene glycol  17 g Oral Daily  . vitamin B-12  500 mcg Oral Daily  . vitamin E  400 Units Oral Daily   Continuous Infusions:  Assessment & Plan:    1.  Chronic diastolic CHF with bilateral pleural effusion/acute respiratory failure: Present on admission.  Walking  desaturation studies showed requirement of 3 L O2 on exertion and home O2 has been set up.  Patient been offered IV Lasix and concern for AKI and chest x-ray today shows worsening CHF.  Patient also received 1 unit of PRBC yesterday.  Patient currently on 4 L O2.  Patient will need to be monitored with resumption of diuretics to avoid CHF exacerbation and readmission.  Explained to patient and niece that euvolemia/respiratory comfort prioritized over optimal creatinine value.  Will consider left pleural effusion tap by IR on Monday (will hold Lovenox after a.m. dose).  2.  AKI on CKD stage III: Likely in the setting of diuretic/cardiorenal syndrome.  Creatinine during the hospital course peaked to 1.96 and down to 1.68 now.  No evidence of hyperkalemia or acidosis.  Unfortunately we will have to diurese patient to achieve optimal balance of CHF control and abnormal creatinine.  Not on ACE inhibitors.  Will resume oral diuretics and monitor progress  3.  Acute on chronic iron deficiency anemia: Iron supplements.  Last stool for occult blood negative in April.  Repeat test pending.  No signs of gross bleeding.  Status post 1 unit PRBC on July 17 with posttransfusion hemoglobin improved to 9.   4.  Diabetes mellitus type 2:?  Diet controlled.  Not on any home meds.  Hgb A1c 5.5  5. Hypertension/hyperlipidemia: Continue current medications.  6.  Hypothyroidism: Synthroid  7.  Severe protein calorie malnutrition: Continue oral supplementation.  DVT prophylaxis: Lovenox Code Status: Full code Family / Patient Communication: Discussed with patient  and discussed with niece Disposition Plan: Home with home health/PT and 24-hour supervision being arranged by family.     LOS: 6 days    Time spent: 35 minutes    [place]ford Shi, MD [facility] Pager [phone]  If 7PM-7AM, please contact night-coverage [email or web address] Password [facility] 04/17/2019, 5:42 PM

## 2019-04-17 NOTE — Plan of Care (Signed)
Patient is on bed, she is educated about how important it is to get help before getting out of the bed.

## 2019-04-18 LAB — CREATININE, SERUM
Creatinine, Ser: 1.65 mg/dL — ABNORMAL HIGH (ref 0.44–1.00)
GFR calc Af Amer: 32 mL/min — ABNORMAL LOW (ref >60)
GFR calc non Af Amer: 28 mL/min — ABNORMAL LOW (ref >60)

## 2019-04-18 NOTE — Progress Notes (Signed)
PROGRESS NOTE    Gina [name]  IWP:[id number]  DOB: December 14, 1928  DOA: 04/11/2019 PCP: Clent Demark, PA-C  Brief Narrative:  85 y.o.femalewith medical history significant ofdiastolic heart failure, hypertension,CKD stage 3b,type 2 diabetes mellitus, hypothyroidism and peripheral vascular disease. Patient presented secondary to dyspnea and found to have an acute heart failure episode. IV diuresis started then [facility] due to AKI.Hospital course complicated by generalized weakness and acute hypoxic respiratory failure, not on oxygen supplementation at baseline. Home 02 evaluation with requirement for 3 L of oxygen by nasal cannula to maintain O2 saturation greater than 90%.  PT recommended home health PT with 24-hour assistance, the patient lives alone.  Subjective:  Patient resting comfortably.  Saturating well on 2 L nasal cannula.  Discharge planning held yesterday and concern for worsening chest x-ray findings and to monitor renal function with resumption of Lasix.  Objective: Vitals:   04/18/19 0618 04/18/19 0949 04/18/19 1330 04/18/19 1718  BP: (!) 156/53 (!) 155/52 (!) 159/36 (!) 158/46  Pulse: 78 69 77 77  Resp: 16 18 16    Temp: 99 F (37.2 C)  98.7 F (37.1 C)   TempSrc:   Oral   SpO2: 91% 93% 100% 100%  Weight:      Height:        Intake/Output Summary (Last 24 hours) at 04/18/2019 1813 Last data filed at 04/18/2019 1718 Gross per 24 hour  Intake 700 ml  Output 900 ml  Net -200 ml   Filed Weights   04/16/19 0500 04/17/19 0627 04/18/19 0425  Weight: 52.3 kg 52.3 kg 51.1 kg    Physical Examination:  General exam: Appears calm and comfortable  Respiratory system: Decreased breath sounds at bases.  Respiratory effort normal. Cardiovascular system: S1 & S2 heard, RRR.No pedal edema. Gastrointestinal system: Abdomen is nondistended, soft and nontender. No organomegaly or masses felt. Normal bowel sounds heard. Central nervous system: Alert and oriented. No  focal neurological deficits. Extremities: Symmetric 5 x 5 power. Skin: No rashes, lesions or ulcers Psychiatry: Judgement and insight appear normal. Mood & affect appropriate.     Data Reviewed: I have personally reviewed following labs and imaging studies  CBC: Recent Labs  Lab 04/12/19 0816 04/13/19 0859 04/15/19 0616 04/16/19 0549  WBC 7.6 5.9 5.2  --   HGB 8.6* 7.5* 7.2* 9.0*  HCT 26.9* 24.0* 23.0* 28.1*  MCV 100.0 100.8* 100.4*  --   PLT 266 217 203  --    Basic Metabolic Panel: Recent Labs  Lab 04/12/19 0816 04/13/19 0859 04/15/19 0616 04/16/19 0238 04/17/19 0405 04/18/19 0441  NA 141 141 138 137 135  --   K 3.4* 3.6 4.3 4.0 4.3  --   CL 109 111 106 106 105  --   CO2 22 22 24 24 22   --   GLUCOSE 117* 99 101* 115* 136*  --   BUN 44* 46* 49* 49* 54*  --   CREATININE 1.61* 1.78* 1.96* 1.73* 1.68* 1.65*  CALCIUM 8.8* 8.3* 8.7* 8.5* 8.5*  --   MG  --   --  2.1  --   --   --   PHOS  --   --  3.4  --   --   --    GFR: Estimated Creatinine Clearance: 19.4 mL/min (A) (by C-G formula based on SCr of 1.65 mg/dL (H)). Liver Function Tests: No results for input(s): AST, ALT, ALKPHOS, BILITOT, PROT, ALBUMIN in the last 168 hours. No results for input(s): LIPASE, AMYLASE  in the last 168 hours. No results for input(s): AMMONIA in the last 168 hours. Coagulation Profile: No results for input(s): INR, PROTIME in the last 168 hours. Cardiac Enzymes: No results for input(s): CKTOTAL, CKMB, CKMBINDEX, TROPONINI in the last 168 hours. BNP (last 3 results) No results for input(s): PROBNP in the last 8760 hours. HbA1C: No results for input(s): HGBA1C in the last 72 hours. CBG: Recent Labs  Lab 04/16/19 1613 04/16/19 2118 04/17/19 0625 04/17/19 2102  GLUCAP 145* 114* 121* 137*   Lipid Profile: No results for input(s): CHOL, HDL, LDLCALC, TRIG, CHOLHDL, LDLDIRECT in the last 72 hours. Thyroid Function Tests: No results for input(s): TSH, T4TOTAL, FREET4, T3FREE,  THYROIDAB in the last 72 hours. Anemia Panel: No results for input(s): VITAMINB12, FOLATE, FERRITIN, TIBC, IRON, RETICCTPCT in the last 72 hours. Sepsis Labs: No results for input(s): PROCALCITON, LATICACIDVEN in the last 168 hours.  Recent Results (from the past 240 hour(s))  SARS Coronavirus 2 (CEPHEID- Performed in [place] hospital lab), Hosp Order     Status: None   Collection Time: 04/11/19 11:27 AM   Specimen: Nasopharyngeal Swab  Result Value Ref Range Status   SARS Coronavirus 2 NEGATIVE NEGATIVE Final    Comment: (NOTE) If result is NEGATIVE SARS-CoV-2 target nucleic acids are NOT DETECTED. The SARS-CoV-2 RNA is generally detectable in upper and lower  respiratory specimens during the acute phase of infection. The lowest  concentration of SARS-CoV-2 viral copies this assay can detect is 250  copies / mL. A negative result does not preclude SARS-CoV-2 infection  and should not be used as the sole basis for treatment or other  patient management decisions.  A negative result may occur with  improper specimen collection / handling, submission of specimen other  than nasopharyngeal swab, presence of viral mutation(s) within the  areas targeted by this assay, and inadequate number of viral copies  (<250 copies / mL). A negative result must be combined with clinical  observations, patient history, and epidemiological information. If result is POSITIVE SARS-CoV-2 target nucleic acids are DETECTED. The SARS-CoV-2 RNA is generally detectable in upper and lower  respiratory specimens dur ing the acute phase of infection.  Positive  results are indicative of active infection with SARS-CoV-2.  Clinical  correlation with patient history and other diagnostic information is  necessary to determine patient infection status.  Positive results do  not rule out bacterial infection or co-infection with other viruses. If result is PRESUMPTIVE POSTIVE SARS-CoV-2 nucleic acids MAY BE  PRESENT.   A presumptive positive result was obtained on the submitted specimen  and confirmed on repeat testing.  While 2019 novel coronavirus  (SARS-CoV-2) nucleic acids may be present in the submitted sample  additional confirmatory testing may be necessary for epidemiological  and / or clinical management purposes  to differentiate between  SARS-CoV-2 and other Sarbecovirus currently known to infect humans.  If clinically indicated additional testing with an alternate test  methodology [phone]) is advised. The SARS-CoV-2 RNA is generally  detectable in upper and lower respiratory sp ecimens during the acute  phase of infection. The expected result is Negative. Fact Sheet for Patients:  StrictlyIdeas.no Fact Sheet for Healthcare Providers: BankingDealers.co.za This test is not yet approved or cleared by the [place] FDA and has been authorized for detection and/or diagnosis of SARS-CoV-2 by FDA under an Emergency Use Authorization (EUA).  This EUA will remain in effect (meaning this test can be used) for the duration of the COVID-19 declaration  under Section 564(b)(1) of the Act, 21 U.S.C. section 360bbb-3(b)(1), unless the authorization is terminated or revoked sooner. Performed at [facility], [place] [address] [place], [place] [address]   Gram stain     Status: None   Collection Time: 04/12/19  3:40 PM   Specimen: Pleural, Right; Pleural Fluid  Result Value Ref Range Status   Specimen Description FLUID RIGHT PLEURAL  Final   Special Requests NONE  Final   Gram Stain   Final    WBC PRESENT,BOTH PMN AND MONONUCLEAR NO ORGANISMS SEEN CYTOSPIN SMEAR Performed at [facility], [place] [address], [place], [place] [address]    Report Status 04/12/2019 FINAL  Final  Culture, body fluid-bottle     Status: None   Collection Time: 04/12/19  3:40 PM   Specimen: Fluid  Result Value Ref Range Status   Specimen Description  FLUID PLEURAL RIGHT  Final   Special Requests BOTTLES DRAWN AEROBIC AND ANAEROBIC  Final   Culture   Final    NO GROWTH 5 DAYS Performed at [facility], [place] [address] [place], [place] [address]    Report Status 04/17/2019 FINAL  Final  MRSA PCR Screening     Status: None   Collection Time: 04/17/19  6:27 AM   Specimen: Nasopharyngeal Wash  Result Value Ref Range Status   MRSA by PCR NEGATIVE NEGATIVE Final    Comment:        The GeneXpert MRSA Assay (FDA approved for NASAL specimens only), is one component of a comprehensive MRSA colonization surveillance program. It is not intended to diagnose MRSA infection nor to guide or monitor treatment for MRSA infections. Performed at [facility], [place] [address] [place], [place] [address]       Radiology Studies: Dg Chest Port 1 View  Result Date: 04/17/2019 CLINICAL DATA:  Follow-up pleural effusion. Congestive heart failure. EXAM: PORTABLE CHEST 1 VIEW COMPARISON:  04/12/2019 FINDINGS: Artifact overlies the chest. There are bilateral pleural effusions, larger on the left than the right, which have enlarged since the study of 5 days ago. There is pulmonary venous hypertension with mild interstitial edema. No acute bone finding. IMPRESSION: Worsened congestive heart failure pattern. Enlarged effusions, more on the left than the right, with basilar atelectasis. Electronically Signed   By: Nelson Chimes M.D.   On: 04/17/2019 10:27        Scheduled Meds: . amLODipine  10 mg Oral Daily  . atorvastatin  20 mg Oral q1800  . calcium carbonate  1 tablet Oral Q breakfast  . enoxaparin (LOVENOX) injection  30 mg Subcutaneous Q24H  . feeding supplement (ENSURE ENLIVE)  237 mL Oral BID BM  . ferrous sulfate  325 mg Oral Daily  . furosemide  40 mg Oral Daily  . hydrALAZINE  25 mg Oral TID  . lactose free nutrition  237 mL Oral TID WC  . latanoprost  1 drop Both Eyes QHS  . levothyroxine  112 mcg Oral Daily  . linaclotide   145 mcg Oral QAC breakfast  . mirtazapine  7.5 mg Oral QHS  . multivitamin with minerals  1 tablet Oral Daily  . polyethylene glycol  17 g Oral Daily  . vitamin B-12  500 mcg Oral Daily  . vitamin E  400 Units Oral Daily   Continuous Infusions:  Assessment & Plan:    1.  Chronic diastolic CHF with bilateral pleural effusion/acute respiratory failure: Present on admission.  Initially diuresed  with IV Lasix which was subsequently held in concern for AKI.  Status post right-sided thoracentesis previously.  Walking desaturation studies showed requirement of 3 L O2 on exertion and home O2 was set up anticipating discharge but patient dyspneic on exertion and repeat chest x-ray 7/18 showed worsening CHF.  Patient also received 1 unit of PRBC on 7/17.  Patient currently on 3 L O2.  Patient will need to be monitored with resumption of diuretics to avoid CHF exacerbation and readmission.  Explained to patient and niece that euvolemia/respiratory comfort prioritized over optimal creatinine value.  May benefit from left pleural effusion tap by IR on Monday (will hold Lovenox tonight).  2.  AKI on CKD stage III: Likely in the setting of diuretic/cardiorenal syndrome.  Creatinine during the hospital course peaked to 1.96 and down to 1.68 now.  No evidence of hyperkalemia or acidosis.  Unfortunately we will have to diurese patient to achieve optimal balance of CHF control and abnormal creatinine.  Not on ACE inhibitors.  Will resume oral diuretics and monitor progress.  Thoracentesis may also reduce the need for high-dose diuretics.  3.  Acute on chronic iron deficiency anemia: Iron supplements.  Last stool for occult blood negative in April.  Repeat test pending.  No signs of gross bleeding.  Status post 1 unit PRBC on July 17 with posttransfusion hemoglobin improved to 9.   4.  Diabetes mellitus type 2:?  Diet controlled.  Not on any home meds.  Hgb A1c 5.5  5. Hypertension/hyperlipidemia: Continue current  medications.  6.  Hypothyroidism: Synthroid  7.  Severe protein calorie malnutrition: Continue oral supplementation.  DVT prophylaxis: Lovenox Code Status: Full code Family / Patient Communication: Discussed with patient and discussed with niece Disposition Plan: Home with home health/PT and 24-hour supervision being arranged by family.     LOS: 7 days    Time spent: 35 minutes    Guilford Shi, MD [facility] Pager [phone]  If 7PM-7AM, please contact night-coverage [email or web address] Password [facility] 04/18/2019, 6:13 PM

## 2019-04-19 LAB — BASIC METABOLIC PANEL
Anion gap: 7 (ref 5–15)
BUN: 50 mg/dL — ABNORMAL HIGH (ref 8–23)
CO2: 26 mmol/L (ref 22–32)
Calcium: 8.3 mg/dL — ABNORMAL LOW (ref 8.9–10.3)
Chloride: 105 mmol/L (ref 98–111)
Creatinine, Ser: 1.66 mg/dL — ABNORMAL HIGH (ref 0.44–1.00)
GFR calc Af Amer: 32 mL/min — ABNORMAL LOW (ref >60)
GFR calc non Af Amer: 27 mL/min — ABNORMAL LOW (ref >60)
Glucose, Bld: 116 mg/dL — ABNORMAL HIGH (ref 70–99)
Potassium: 4.4 mmol/L (ref 3.5–5.1)
Sodium: 138 mmol/L (ref 135–145)

## 2019-04-19 LAB — SARS CORONAVIRUS 2 BY RT PCR (HOSPITAL ORDER, PERFORMED IN ~~LOC~~ HOSPITAL LAB): SARS Coronavirus 2: NEGATIVE

## 2019-04-19 MED ORDER — ALBUTEROL SULFATE HFA 108 (90 BASE) MCG/ACT IN AERS: 2.0000 | INHALATION_SPRAY | Freq: Four times a day (QID) | RESPIRATORY_TRACT | 1 refills | Status: DC | PRN

## 2019-04-19 MED ORDER — FUROSEMIDE 20 MG PO TABS: 40.0000 mg | ORAL_TABLET | Freq: Every day | ORAL | 0 refills | Status: DC

## 2019-04-19 NOTE — Progress Notes (Signed)
OT Cancellation Note  Patient Details Name: Gina [name] MRN: [id number] DOB: 29-Aug-1929   Cancelled Treatment:    Reason Eval/Treat Not Completed: Patient declined, no reason specified (pt reports her back hurts too much and she needs to wait until her medication starts to work).  Will follow.  Delight Stare, OT Acute Rehabilitation Services Pager [phone] Office [phone]   Delight Stare 04/19/2019, 8:49 AM

## 2019-04-19 NOTE — Care Management Important Message (Signed)
Important Message  Patient Details  Name: Gina [name] MRN: [id number] Date of Birth: August 08, 1929   Medicare Important Message Given:  Yes     [name] 04/19/2019, 12:58 PM

## 2019-04-19 NOTE — Plan of Care (Signed)

## 2019-04-19 NOTE — Progress Notes (Signed)
Physical Therapy Treatment Patient Details Name: Gina [name] MRN: [id number] DOB: [birth year] Today's Date: 04/19/2019    History of Present Illness Pt is an 85 y/o female admitted secondary to worsening SOB and edema. PMH includes HTN, PVD, DM, CKD, and dCHF, and PVD.     PT Comments    Pt awake and willing to participate and mobilize stating back pain declined from early Am when checked on. Pt very slow to initiate movement and increased time positioning bed and clothes and expectorating prior to standing. Pt then requested to brush teeth prior to gait which required 11 min standing at sink without UE support to complete task. Pt then reported fatigue and LBP with return to bed and refusal to mobilize further and remain EOB. Pt educated for HEp and need for increased mobility for return home.    Follow Up Recommendations  Home health PT;Supervision/Assistance - 24 hour     Equipment Recommendations  None recommended by PT    Recommendations for Other Services       Precautions / Restrictions Precautions Precautions: Fall Precaution Comments: watch sats    Mobility  Bed Mobility Overal bed mobility: Modified Independent             General bed mobility comments: increased time with HOB 30 degrees, return to EOB  Transfers Overall transfer level: Modified independent   Transfers: Sit to/from [employer] Transfers Sit to Stand: Modified independent (Device/Increase time)         General transfer comment: pt able to stand from bed without assist  Ambulation/Gait Ambulation/Gait assistance: Min guard Gait Distance (Feet): 10 Feet Assistive device: Rolling walker (2 wheeled) Gait Pattern/deviations: Step-through pattern;Decreased stride length;Narrow base of support;Trunk flexed   Gait velocity interpretation: 1.31 - 2.62 ft/sec, indicative of limited community ambulator General Gait Details: cues for proximity to rW. pt walked bed to and from sink then  refused further gait   Stairs             Wheelchair Mobility    Modified Rankin (Stroke Patients Only)       Balance Overall balance assessment: Needs assistance Sitting-balance support: No upper extremity supported;Feet supported Sitting balance-Leahy Scale: Good     Standing balance support: Bilateral upper extremity supported;During functional activity;No upper extremity supported Standing balance-Leahy Scale: Poor Standing balance comment: RW for gait, no UE assist standing at sink                            Cognition Arousal/Alertness: Awake/alert Behavior During Therapy: Flat affect Overall Cognitive Status: No family/caregiver present to determine baseline cognitive functioning                                        Exercises General Exercises - Lower Extremity Long Arc Quad: AROM;Both;Seated;10 reps Hip Flexion/Marching: AROM;Both;10 reps;Seated    General Comments        Pertinent Vitals/Pain Pain Assessment: 0-10 Pain Score: 5  Pain Location: low back pain Pain Descriptors / Indicators: Aching;Constant Pain Intervention(s): Limited activity within patient's tolerance;Repositioned;Monitored during session;Other (comment)(pt denied pain medication)    Home Living                      Prior Function            PT Goals (current goals can now be found  in the care plan section) Progress towards PT goals: Not progressing toward goals - comment(limited by back pain)    Frequency    Min 3X/week      PT Plan Current plan remains appropriate    Co-evaluation              AM-PAC PT "6 Clicks" Mobility   Outcome Measure  Help needed turning from your back to your side while in a flat bed without using bedrails?: None Help needed moving from lying on your back to sitting on the side of a flat bed without using bedrails?: None Help needed moving to and from a bed to a chair (including a wheelchair)?:  A Little Help needed standing up from a chair using your arms (e.g., wheelchair or bedside chair)?: A Little Help needed to walk in hospital room?: A Little Help needed climbing 3-5 steps with a railing? : A Little 6 Click Score: 20    End of Session Equipment Utilized During Treatment: Gait belt;Oxygen Activity Tolerance: Patient tolerated treatment well Patient left: with call bell/phone within reach;in bed;with bed alarm set Nurse Communication: Mobility status PT Visit Diagnosis: Unsteadiness on feet (R26.81);Muscle weakness (generalized) (M62.81)     Time: 0315-9458 PT Time Calculation (min) (ACUTE ONLY): 35 min  Charges:  $Therapeutic Activity: 23-37 mins                     Raileigh Sabater Pam Drown, PT Acute Rehabilitation Services Pager: [phone] Office: [place] 04/19/2019, 1:49 PM

## 2019-04-19 NOTE — Progress Notes (Signed)
OT Cancellation Note  Patient Details Name: Gina [name] MRN: [id number] DOB: [birth year]   Cancelled Treatment:    Reason Eval/Treat Not Completed: Fatigue/lethargy limiting ability to participate.  Patient reports "I just had a bowel movement, I need to rest".  Declining OT.  Will follow.   Delight Stare, OT Acute Rehabilitation Services Pager [phone] Office [phone]   Delight Stare 04/19/2019, 2:52 PM

## 2019-04-19 NOTE — Discharge Summary (Signed)
Physician Discharge Summary  Gina [name]:[id number] DOB: 1929/07/13 DOA: 04/11/2019  PCP: Clent Demark, PA-C  Admit date: 04/11/2019 Discharge date: 04/20/19 Consultations: Interventional radiology Admitted From: Home Disposition: Home  Discharge Diagnoses:  Principal Problem:   Heart failure ([place]) Active Problems:   Essential hypertension   PVD (peripheral vascular disease) ([employer])   Diabetes mellitus, type 2 ([place])   CKD (chronic kidney disease) stage 3, GFR 30-59 ml/min [facility])   Hospital Course Summary:  85 y.o.femalewith medical history significant ofdiastolic heart failure, hypertension,CKD stage 3b,type 2 diabetes mellitus, hypothyroidism and peripheral vascular disease. Patient presented secondary to dyspnea and found to have an acute heart failure episode. IV diuresis started then DCeddue to AKI.Hospital course complicated by generalized weakness and acute hypoxic respiratory failure, not on oxygen supplementation at baseline.Home 02 evaluation with requirement for 3 L of oxygen by nasal cannula to maintain O2 saturation greater than 90%. PT recommended home health PT with 24-hour assistance, the patient lives alone.  1.  Chronic diastolic CHF with bilateral pleural effusion/acute respiratory failure: Present on admission.  Initially diuresed with IV Lasix which was subsequently held in concern for AKI.  Status post right-sided thoracentesis previously.  Walking desaturation studies showed requirement of 3 L O2 on exertion and home O2 was set up anticipating discharge but patient dyspneic on exertion and repeat chest x-ray 7/18 showed worsening CHF.  Patient also received 1 unit of PRBC on 7/17.  Explained to patient and niece that euvolemia/respiratory comfort prioritized over optimal creatinine value. Patient was  monitored with resumption of oral Lasix at 40 mg daily and fortunately her renal function/creatinine level has remained stable. Patient felt to benefit from  left pleural effusion tap and scheduled for thoracentesis 7/20 through IR.  COVID testing resulted negative but per nurse no response from IR this evening.  She will  stay overnight and leave after tap in a.m.  Patient currently saturating well on 2 L O2.    2.  AKI on CKD stage III: Likely in the setting of diuretic/cardiorenal syndrome.  Creatinine during the hospital course peaked to 1.96 and down to 1.68 now.  No evidence of hyperkalemia or acidosis.  Unfortunately we will have to diurese patient to achieve optimal balance of CHF control and abnormal creatinine.  Not on ACE inhibitors.  Resumed oral diuretics and so far renal function tolerating with creatinine stable around 1.6. Thoracentesis may also reduce the need for high-dose diuretics and prevent readmissions.  3.  Acute on chronic iron deficiency anemia: Iron supplements.  Last stool for occult blood negative in April.  Repeat test pending.  No signs of gross bleeding.  Status post 1 unit PRBC on July 17 with posttransfusion hemoglobin improved to 9.   4.  Diabetes mellitus type 2:?  Diet controlled.  Not on any home meds.  Hgb A1c 5.5  5. Hypertension/hyperlipidemia: Continue current medications.  6.  Hypothyroidism: resume Synthroid  7.  Severe protein calorie malnutrition: Continue protein supplements  8. Disposition: Home with home health/PT/home O2 which have been set up and 24-hour supervision arranged by family.  I discussed with her niece this morning and plan for discharge after thoracocentesis and she appreciated care provided   Discharge Exam:  Vitals:   04/19/19 0844 04/19/19 1252  BP: (!) 152/28 (!) 160/90  Pulse: 75 76  Resp:    Temp:    SpO2:  95%   Vitals:   04/19/19 0612 04/19/19 0644 04/19/19 0844 04/19/19 1252  BP: (!) 153/41 Marland Kitchen)  148/55 (!) 152/28 (!) 160/90  Pulse: 84 74 75 76  Resp: 18 18    Temp: 98.6 F (37 C) 99.4 F (37.4 C)    TempSrc: Oral     SpO2: 94% 95%  95%  Weight:  51.2 kg     Height:        General: Pt is alert, awake, not in acute distress Cardiovascular: RRR, S1/S2 +, no rubs, no gallops Respiratory: CTA bilaterally, decreased BS at left base,  no wheezing, no rhonchi Abdominal: Soft, NT, ND, bowel sounds + Extremities: no edema, no cyanosis  Discharge Condition:Stable CODE STATUS: Full code Diet recommendation: 2 gram sodium , diabetic diet Recommendations for Outpatient Follow-up:  1. Follow up with PCP: 1 week 2. Follow up with consultants:  3. Please obtain follow up labs including: BMP in 1 week  [place] services upon discharge:  Equipment/Devices upon discharge:   Discharge Instructions:  Discharge Instructions    ([place]) Call MD:  Anytime you have any of the following symptoms: 1) 3 pound weight gain in 24 hours or 5 pounds in 1 week 2) shortness of breath, with or without a dry hacking cough 3) swelling in the hands, feet or stomach 4) if you have to sleep on extra pillows at night in order to breathe.   Complete by: As directed    Call MD for:  difficulty breathing, headache or visual disturbances   Complete by: As directed    Call MD for:  persistant dizziness or light-headedness   Complete by: As directed    Call MD for:  persistant nausea and vomiting   Complete by: As directed    Diet - low sodium heart healthy   Complete by: As directed    Discharge instructions   Complete by: As directed    Use O2 2lits continuous and on exertion   Increase activity slowly   Complete by: As directed      Allergies as of 04/19/2019   No Known Allergies     Medication List    TAKE these medications   albuterol 108 (90 Base) MCG/ACT inhaler Commonly known as: VENTOLIN HFA Inhale 2 puffs into the lungs every 6 (six) hours as needed for wheezing or shortness of breath.   amLODipine 10 MG tablet Commonly known as: NORVASC Take 1 tablet (10 mg total) by mouth daily.   aspirin 81 MG EC tablet Take 1 tablet (81 mg  total) by mouth daily.   atorvastatin 20 MG tablet Commonly known as: LIPITOR Take 1 tablet (20 mg total) by mouth daily at 6 PM.   bimatoprost 0.01 % Soln Commonly known as: LUMIGAN Place 1 drop into both eyes at bedtime.   calcium carbonate 1500 (600 Ca) MG Tabs tablet Commonly known as: OSCAL Take 600 mg of elemental calcium by mouth daily.   feeding supplement (ENSURE ENLIVE) Liqd Take 237 mLs by mouth 2 (two) times daily between meals.   ferrous sulfate 325 (65 FE) MG EC tablet Take 1 tablet (325 mg total) by mouth daily.   furosemide 20 MG tablet Commonly known as: LASIX Take 2 tablets (40 mg total) by mouth daily. What changed: how much to take   hydrALAZINE 25 MG tablet Commonly known as: APRESOLINE Take 1 tablet (25 mg total) by mouth 3 (three) times daily.   latanoprost 0.005 % ophthalmic solution Commonly known as: XALATAN Place 1 drop into both eyes at bedtime.   levothyroxine 112 MCG tablet Commonly known as:  SYNTHROID Take 1 tablet (112 mcg total) by mouth daily.   linaclotide 145 MCG Caps capsule Commonly known as: Linzess Take 1 capsule (145 mcg total) by mouth daily before breakfast. What changed: See the new instructions.   mirtazapine 15 MG tablet Commonly known as: REMERON take ONE-HALF tablet BY MOUTH AT BEDTIME   Rhopressa 0.02 % Soln Generic drug: Netarsudil Dimesylate Place 1 drop into both eyes at bedtime.   vitamin B-12 500 MCG tablet Commonly known as: CYANOCOBALAMIN Take 500 mcg by mouth daily.   vitamin E 400 UNIT capsule Take 400 Units by mouth daily.            Durable Medical Equipment  (From admission, onward)         Start     Ordered   04/16/19 0511  For home use only DME oxygen  Once    Question Answer Comment  Length of Need 6 Months   Mode or (Route) Nasal cannula   Liters per Minute 3   Frequency Continuous (stationary and portable oxygen unit needed)   Oxygen conserving device Yes   Oxygen delivery  system Gas      04/16/19 0510          No Known Allergies    The results of significant diagnostics from this hospitalization (including imaging, microbiology, ancillary and laboratory) are listed below for reference.    Labs: BNP (last 3 results) Recent Labs    04/11/19 1127  BNP 2,440.1*   Basic Metabolic Panel: Recent Labs  Lab 04/13/19 0859 04/15/19 0616 04/16/19 0238 04/17/19 0405 04/18/19 0441 04/19/19 0510  NA 141 138 137 135  --  138  K 3.6 4.3 4.0 4.3  --  4.4  CL 111 106 106 105  --  105  CO2 22 24 24 22   --  26  GLUCOSE 99 101* 115* 136*  --  116*  BUN 46* 49* 49* 54*  --  50*  CREATININE 1.78* 1.96* 1.73* 1.68* 1.65* 1.66*  CALCIUM 8.3* 8.7* 8.5* 8.5*  --  8.3*  MG  --  2.1  --   --   --   --   PHOS  --  3.4  --   --   --   --    Liver Function Tests: No results for input(s): AST, ALT, ALKPHOS, BILITOT, PROT, ALBUMIN in the last 168 hours. No results for input(s): LIPASE, AMYLASE in the last 168 hours. No results for input(s): AMMONIA in the last 168 hours. CBC: Recent Labs  Lab 04/13/19 0859 04/15/19 0616 04/16/19 0549  WBC 5.9 5.2  --   HGB 7.5* 7.2* 9.0*  HCT 24.0* 23.0* 28.1*  MCV 100.8* 100.4*  --   PLT 217 203  --    Cardiac Enzymes: No results for input(s): CKTOTAL, CKMB, CKMBINDEX, TROPONINI in the last 168 hours. BNP: Invalid input(s): POCBNP CBG: Recent Labs  Lab 04/16/19 1613 04/16/19 2118 04/17/19 0625 04/17/19 2102  GLUCAP 145* 114* 121* 137*   D-Dimer No results for input(s): DDIMER in the last 72 hours. Hgb A1c No results for input(s): HGBA1C in the last 72 hours. Lipid Profile No results for input(s): CHOL, HDL, LDLCALC, TRIG, CHOLHDL, LDLDIRECT in the last 72 hours. Thyroid function studies No results for input(s): TSH, T4TOTAL, T3FREE, THYROIDAB in the last 72 hours.  Invalid input(s): FREET3 Anemia work up No results for input(s): VITAMINB12, FOLATE, FERRITIN, TIBC, IRON, RETICCTPCT in the last 72  hours. Urinalysis    Component Value  Date/Time   COLORURINE YELLOW 03/26/2019 2253   APPEARANCEUR CLEAR 03/26/2019 2253   LABSPEC 1.011 03/26/2019 2253   PHURINE 5.0 03/26/2019 2253   GLUCOSEU NEGATIVE 03/26/2019 2253   GLUCOSEU NEG mg/dL 02/20/2009 2049   HGBUR SMALL (A) 03/26/2019 2253   BILIRUBINUR NEGATIVE 03/26/2019 2253   BILIRUBINUR neg 05/06/2018 1200   KETONESUR NEGATIVE 03/26/2019 2253   PROTEINUR 100 (A) 03/26/2019 2253   UROBILINOGEN 0.2 05/06/2018 1200   UROBILINOGEN 0.2 04/25/2014 1023   NITRITE NEGATIVE 03/26/2019 2253   LEUKOCYTESUR NEGATIVE 03/26/2019 2253   Sepsis Labs Invalid input(s): PROCALCITONIN,  WBC,  LACTICIDVEN Microbiology Recent Results (from the past 240 hour(s))  SARS Coronavirus 2 (CEPHEID- Performed in [place] hospital lab), Hosp Order     Status: None   Collection Time: 04/11/19 11:27 AM   Specimen: Nasopharyngeal Swab  Result Value Ref Range Status   SARS Coronavirus 2 NEGATIVE NEGATIVE Final    Comment: (NOTE) If result is NEGATIVE SARS-CoV-2 target nucleic acids are NOT DETECTED. The SARS-CoV-2 RNA is generally detectable in upper and lower  respiratory specimens during the acute phase of infection. The lowest  concentration of SARS-CoV-2 viral copies this assay can detect is 250  copies / mL. A negative result does not preclude SARS-CoV-2 infection  and should not be used as the sole basis for treatment or other  patient management decisions.  A negative result may occur with  improper specimen collection / handling, submission of specimen other  than nasopharyngeal swab, presence of viral mutation(s) within the  areas targeted by this assay, and inadequate number of viral copies  (<250 copies / mL). A negative result must be combined with clinical  observations, patient history, and epidemiological information. If result is POSITIVE SARS-CoV-2 target nucleic acids are DETECTED. The SARS-CoV-2 RNA is generally detectable in upper  and lower  respiratory specimens dur ing the acute phase of infection.  Positive  results are indicative of active infection with SARS-CoV-2.  Clinical  correlation with patient history and other diagnostic information is  necessary to determine patient infection status.  Positive results do  not rule out bacterial infection or co-infection with other viruses. If result is PRESUMPTIVE POSTIVE SARS-CoV-2 nucleic acids MAY BE PRESENT.   A presumptive positive result was obtained on the submitted specimen  and confirmed on repeat testing.  While 2019 novel coronavirus  (SARS-CoV-2) nucleic acids may be present in the submitted sample  additional confirmatory testing may be necessary for epidemiological  and / or clinical management purposes  to differentiate between  SARS-CoV-2 and other Sarbecovirus currently known to infect humans.  If clinically indicated additional testing with an alternate test  methodology [phone]) is advised. The SARS-CoV-2 RNA is generally  detectable in upper and lower respiratory sp ecimens during the acute  phase of infection. The expected result is Negative. Fact Sheet for Patients:  StrictlyIdeas.no Fact Sheet for Healthcare Providers: BankingDealers.co.za This test is not yet approved or cleared by the [place] FDA and has been authorized for detection and/or diagnosis of SARS-CoV-2 by FDA under an Emergency Use Authorization (EUA).  This EUA will remain in effect (meaning this test can be used) for the duration of the COVID-19 declaration under Section 564(b)(1) of the Act, 21 U.S.C. section 360bbb-3(b)(1), unless the authorization is terminated or revoked sooner. Performed at [facility], [place] [address], [place], [place] [address]   Gram stain     Status: None   Collection Time: 04/12/19  3:40 PM  Specimen: Pleural, Right; Pleural Fluid  Result Value Ref Range Status   Specimen Description  FLUID RIGHT PLEURAL  Final   Special Requests NONE  Final   Gram Stain   Final    WBC PRESENT,BOTH PMN AND MONONUCLEAR NO ORGANISMS SEEN CYTOSPIN SMEAR Performed at [facility], [address] [place], [place] [address]    Report Status 04/12/2019 FINAL  Final  Culture, body fluid-bottle     Status: None   Collection Time: 04/12/19  3:40 PM   Specimen: Fluid  Result Value Ref Range Status   Specimen Description FLUID PLEURAL RIGHT  Final   Special Requests BOTTLES DRAWN AEROBIC AND ANAEROBIC  Final   Culture   Final    NO GROWTH 5 DAYS Performed at [facility], [place] [address] [place], [place] [address]    Report Status 04/17/2019 FINAL  Final  MRSA PCR Screening     Status: None   Collection Time: 04/17/19  6:27 AM   Specimen: Nasopharyngeal Wash  Result Value Ref Range Status   MRSA by PCR NEGATIVE NEGATIVE Final    Comment:        The GeneXpert MRSA Assay (FDA approved for NASAL specimens only), is one component of a comprehensive MRSA colonization surveillance program. It is not intended to diagnose MRSA infection nor to guide or monitor treatment for MRSA infections. Performed at [facility], [place] [address], [place], [place] [address]   SARS Coronavirus 2 (CEPHEID - Performed in [place] hospital lab), Hosp Order     Status: None   Collection Time: 04/19/19 11:45 AM   Specimen: Nasopharyngeal Swab  Result Value Ref Range Status   SARS Coronavirus 2 NEGATIVE NEGATIVE Final    Comment: (NOTE) If result is NEGATIVE SARS-CoV-2 target nucleic acids are NOT DETECTED. The SARS-CoV-2 RNA is generally detectable in upper and lower  respiratory specimens during the acute phase of infection. The lowest  concentration of SARS-CoV-2 viral copies this assay can detect is 250  copies / mL. A negative result does not preclude SARS-CoV-2 infection  and should not be used as the sole basis for treatment or other  patient management decisions.  A negative  result may occur with  improper specimen collection / handling, submission of specimen other  than nasopharyngeal swab, presence of viral mutation(s) within the  areas targeted by this assay, and inadequate number of viral copies  (<250 copies / mL). A negative result must be combined with clinical  observations, patient history, and epidemiological information. If result is POSITIVE SARS-CoV-2 target nucleic acids are DETECTED. The SARS-CoV-2 RNA is generally detectable in upper and lower  respiratory specimens dur ing the acute phase of infection.  Positive  results are indicative of active infection with SARS-CoV-2.  Clinical  correlation with patient history and other diagnostic information is  necessary to determine patient infection status.  Positive results do  not rule out bacterial infection or co-infection with other viruses. If result is PRESUMPTIVE POSTIVE SARS-CoV-2 nucleic acids MAY BE PRESENT.   A presumptive positive result was obtained on the submitted specimen  and confirmed on repeat testing.  While 2019 novel coronavirus  (SARS-CoV-2) nucleic acids may be present in the submitted sample  additional confirmatory testing may be necessary for epidemiological  and / or clinical management purposes  to differentiate between  SARS-CoV-2 and other Sarbecovirus currently known to infect humans.  If clinically indicated additional testing with an alternate test  methodology [phone])  is advised. The SARS-CoV-2 RNA is generally  detectable in upper and lower respiratory sp ecimens during the acute  phase of infection. The expected result is Negative. Fact Sheet for Patients:  StrictlyIdeas.no Fact Sheet for Healthcare Providers: BankingDealers.co.za This test is not yet approved or cleared by the [place] FDA and has been authorized for detection and/or diagnosis of SARS-CoV-2 by FDA under an Emergency Use Authorization  (EUA).  This EUA will remain in effect (meaning this test can be used) for the duration of the COVID-19 declaration under Section 564(b)(1) of the Act, 21 U.S.C. section 360bbb-3(b)(1), unless the authorization is terminated or revoked sooner. Performed at [facility], [place] [address] [place], [place] [address]     Procedures/Studies: Dg Eye Foreign Body  Result Date: 03/27/2019 CLINICAL DATA:  Metal working/exposure; clearance prior to MRI EXAM: ORBITS FOR FOREIGN BODY - 2 VIEW COMPARISON:  None. FINDINGS: Water's views with eyes deviated to the left into the right were obtained. There is a focus of apparent metal within the right orbit. There is opacification in the left maxillary antrum. No fracture or dislocation. IMPRESSION: Focus of metal within the right orbit. This finding is a contraindication for MRI. There is opacification in the left maxillary antrum. These results will be called to the ordering clinician or representative by the Radiologist Assistant, and communication documented in the PACS or zVision Dashboard. Electronically Signed   By: Lowella Grip III M.D.   On: 03/27/2019 12:31   Dg Chest 1 View  Result Date: 04/12/2019 CLINICAL DATA:  Post right thoracentesis EXAM: CHEST  1 VIEW COMPARISON:  03/12/2019 FINDINGS: Significant improvement in right pleural effusion now small. No pneumothorax. Moderate left pleural effusion and left lower lobe atelectasis. Left effusion appears larger. Mild vascular congestion without edema. Atherosclerotic aortic arch. IMPRESSION: No complication post right thoracentesis Apparent progression of left pleural effusion. Electronically Signed   By: Franchot Gallo M.D.   On: 04/12/2019 16:07   Ct Head Wo Contrast  Result Date: 03/26/2019 CLINICAL DATA:  85 year old female with vertigo. EXAM: CT HEAD WITHOUT CONTRAST TECHNIQUE: Contiguous axial images were obtained from the base of the skull through the vertex without intravenous contrast.  COMPARISON:  Head CT dated 02/07/2016 FINDINGS: Brain: There is mild age-related atrophy and moderate chronic microvascular ischemic changes. There is no acute intracranial hemorrhage. No mass effect or midline shift. No extra-axial fluid collection. Vascular: No hyperdense vessel or unexpected calcification. Skull: Normal. Negative for fracture or focal lesion. Sinuses/Orbits: Left maxillary sinus retention cyst or polyp. The remainder of the visualized paranasal sinuses and mastoid air cells are clear. No air-fluid level. A 3 mm radiopaque foreign object over the right globe similar to prior CT. Other: None IMPRESSION: 1. No acute intracranial hemorrhage. 2. Age-related atrophy and chronic microvascular ischemic changes. Electronically Signed   By: Anner Crete M.D.   On: 03/26/2019 21:01   Dg Chest Port 1 View  Result Date: 04/17/2019 CLINICAL DATA:  Follow-up pleural effusion. Congestive heart failure. EXAM: PORTABLE CHEST 1 VIEW COMPARISON:  04/12/2019 FINDINGS: Artifact overlies the chest. There are bilateral pleural effusions, larger on the left than the right, which have enlarged since the study of 5 days ago. There is pulmonary venous hypertension with mild interstitial edema. No acute bone finding. IMPRESSION: Worsened congestive heart failure pattern. Enlarged effusions, more on the left than the right, with basilar atelectasis. Electronically Signed   By: Nelson Chimes M.D.   On: 04/17/2019 10:27   Dg Chest  Port 1 View  Result Date: 04/11/2019 CLINICAL DATA:  Shortness of breath EXAM: PORTABLE CHEST 1 VIEW COMPARISON:  January 14, 2019 FINDINGS: There is patchy airspace opacity in the left lower lobe with small left pleural effusion. There is a moderate right pleural effusion with apparent consolidation in portions of the right mid lung and right base. Heart is upper normal in size with pulmonary vascularity normal. There is aortic atherosclerosis. No evident adenopathy. There is calcification  in the region of the right subclavian artery. There is also calcification in each carotid artery. IMPRESSION: Areas of apparent pneumonia in both lower lung zones and right mid lung region. Pleural effusions bilaterally, larger on the right than on the left. Heart upper normal in size. No adenopathy evident. Foci of calcification in each carotid artery. Aortic Atherosclerosis (ICD10-I70.0). Electronically Signed   By: Lowella Grip III M.D.   On: 04/11/2019 11:21   Ir Thoracentesis Asp Pleural Space W/img Guide  Result Date: 04/12/2019 INDICATION: Patient with history of congestive heart failure, hypertension, chronic kidney disease, bilateral pleural effusions right greater than left. Request made for diagnostic and therapeutic right thoracentesis. EXAM: ULTRASOUND GUIDED DIAGNOSTIC AND THERAPEUTIC RIGHT THORACENTESIS MEDICATIONS: None COMPLICATIONS: None immediate. PROCEDURE: An ultrasound guided thoracentesis was thoroughly discussed with the patient and questions answered. The benefits, risks, alternatives and complications were also discussed. The patient understands and wishes to proceed with the procedure. Written consent was obtained. Ultrasound was performed to localize and mark an adequate pocket of fluid in the right chest. The area was then prepped and draped in the normal sterile fashion. 1% Lidocaine was used for local anesthesia. Under ultrasound guidance a 6 Fr Safe-T-Centesis catheter was introduced. Thoracentesis was performed. The catheter was removed and a dressing applied. FINDINGS: A total of approximately 1.4 liters of yellow fluid was removed. Samples were sent to the laboratory as requested by the clinical team. IMPRESSION: Successful ultrasound guided diagnostic and therapeutic right thoracentesis yielding 1.4 liters of pleural fluid. Read by: Rowe Robert, PA-C No pneumothorax on follow-up radiograph. Electronically Signed   By: Lucrezia Europe M.D.   On: 04/12/2019 15:53     Time  coordinating discharge: Over 30 minutes  SIGNED:   Guilford Shi, MD  [facility] 04/19/2019, 6:04 PM Pager : [phone]

## 2019-04-19 NOTE — Progress Notes (Signed)
PT Cancellation Note  Patient Details Name: Gina [name] MRN: [id number] DOB: [birth year]   Cancelled Treatment:    Reason Eval/Treat Not Completed: Patient declined, no reason specified(pt currently refusing to mobilize to get up for breakfast stating she slept crooked and can't get up yet)   Amerigo Mcglory B Haruo Stepanek 04/19/2019, 7:26 AM  Elwyn Reach, [place] Pager: [phone] Office: [phone]

## 2019-04-19 NOTE — Progress Notes (Addendum)
PROGRESS NOTE    Gina [name]  KGM:[id number]  DOB: 11/07/1928  DOA: 04/11/2019 PCP: Clent Demark, PA-C  Brief Narrative:  85 y.o.femalewith medical history significant ofdiastolic heart failure, hypertension,CKD stage 3b,type 2 diabetes mellitus, hypothyroidism and peripheral vascular disease. Patient presented secondary to dyspnea and found to have an acute heart failure episode. IV diuresis started then [facility] due to AKI.Hospital course complicated by generalized weakness and acute hypoxic respiratory failure, not on oxygen supplementation at baseline. Home 02 evaluation with requirement for 3 L of oxygen by nasal cannula to maintain O2 saturation greater than 90%.  PT recommended home health PT with 24-hour assistance, the patient lives alone.  Subjective:  Patient planned for discharge today after thoracentesis.  Discussed with IR this morning who recommended call with testing prior to left-sided thoracentesis.  Patient saturating well on 2 L nasal cannula.  Objective: Vitals:   04/19/19 0612 04/19/19 0644 04/19/19 0844 04/19/19 1252  BP: (!) 153/41 (!) 148/55 (!) 152/28 (!) 160/90  Pulse: 84 74 75 76  Resp: 18 18    Temp: 98.6 F (37 C) 99.4 F (37.4 C)    TempSrc: Oral     SpO2: 94% 95%  95%  Weight:  51.2 kg    Height:        Intake/Output Summary (Last 24 hours) at 04/19/2019 1756 Last data filed at 04/19/2019 1351 Gross per 24 hour  Intake 476 ml  Output 600 ml  Net -124 ml   Filed Weights   04/17/19 0627 04/18/19 0425 04/19/19 0644  Weight: 52.3 kg 51.1 kg 51.2 kg    Physical Examination:  General exam: Appears calm and comfortable  Respiratory system: Decreased breath sounds at bases.  Respiratory effort normal. Cardiovascular system: S1 & S2 heard, RRR.No pedal edema. Gastrointestinal system: Abdomen is nondistended, soft and nontender. No organomegaly or masses felt. Normal bowel sounds heard. Central nervous system: Alert and oriented. No  focal neurological deficits. Extremities: Symmetric 5 x 5 power. Skin: No rashes, lesions or ulcers Psychiatry: Judgement and insight appear normal. Mood & affect appropriate.     Data Reviewed: I have personally reviewed following labs and imaging studies  CBC: Recent Labs  Lab 04/13/19 0859 04/15/19 0616 04/16/19 0549  WBC 5.9 5.2  --   HGB 7.5* 7.2* 9.0*  HCT 24.0* 23.0* 28.1*  MCV 100.8* 100.4*  --   PLT 217 203  --    Basic Metabolic Panel: Recent Labs  Lab 04/13/19 0859 04/15/19 0616 04/16/19 0238 04/17/19 0405 04/18/19 0441 04/19/19 0510  NA 141 138 137 135  --  138  K 3.6 4.3 4.0 4.3  --  4.4  CL 111 106 106 105  --  105  CO2 22 24 24 22   --  26  GLUCOSE 99 101* 115* 136*  --  116*  BUN 46* 49* 49* 54*  --  50*  CREATININE 1.78* 1.96* 1.73* 1.68* 1.65* 1.66*  CALCIUM 8.3* 8.7* 8.5* 8.5*  --  8.3*  MG  --  2.1  --   --   --   --   PHOS  --  3.4  --   --   --   --    GFR: Estimated Creatinine Clearance: 19.3 mL/min (A) (by C-G formula based on SCr of 1.66 mg/dL (H)). Liver Function Tests: No results for input(s): AST, ALT, ALKPHOS, BILITOT, PROT, ALBUMIN in the last 168 hours. No results for input(s): LIPASE, AMYLASE in the last 168 hours. No results  for input(s): AMMONIA in the last 168 hours. Coagulation Profile: No results for input(s): INR, PROTIME in the last 168 hours. Cardiac Enzymes: No results for input(s): CKTOTAL, CKMB, CKMBINDEX, TROPONINI in the last 168 hours. BNP (last 3 results) No results for input(s): PROBNP in the last 8760 hours. HbA1C: No results for input(s): HGBA1C in the last 72 hours. CBG: Recent Labs  Lab 04/16/19 1613 04/16/19 2118 04/17/19 0625 04/17/19 2102  GLUCAP 145* 114* 121* 137*   Lipid Profile: No results for input(s): CHOL, HDL, LDLCALC, TRIG, CHOLHDL, LDLDIRECT in the last 72 hours. Thyroid Function Tests: No results for input(s): TSH, T4TOTAL, FREET4, T3FREE, THYROIDAB in the last 72 hours. Anemia Panel:  No results for input(s): VITAMINB12, FOLATE, FERRITIN, TIBC, IRON, RETICCTPCT in the last 72 hours. Sepsis Labs: No results for input(s): PROCALCITON, LATICACIDVEN in the last 168 hours.  Recent Results (from the past 240 hour(s))  SARS Coronavirus 2 (CEPHEID- Performed in [place] hospital lab), Hosp Order     Status: None   Collection Time: 04/11/19 11:27 AM   Specimen: Nasopharyngeal Swab  Result Value Ref Range Status   SARS Coronavirus 2 NEGATIVE NEGATIVE Final    Comment: (NOTE) If result is NEGATIVE SARS-CoV-2 target nucleic acids are NOT DETECTED. The SARS-CoV-2 RNA is generally detectable in upper and lower  respiratory specimens during the acute phase of infection. The lowest  concentration of SARS-CoV-2 viral copies this assay can detect is 250  copies / mL. A negative result does not preclude SARS-CoV-2 infection  and should not be used as the sole basis for treatment or other  patient management decisions.  A negative result may occur with  improper specimen collection / handling, submission of specimen other  than nasopharyngeal swab, presence of viral mutation(s) within the  areas targeted by this assay, and inadequate number of viral copies  (<250 copies / mL). A negative result must be combined with clinical  observations, patient history, and epidemiological information. If result is POSITIVE SARS-CoV-2 target nucleic acids are DETECTED. The SARS-CoV-2 RNA is generally detectable in upper and lower  respiratory specimens dur ing the acute phase of infection.  Positive  results are indicative of active infection with SARS-CoV-2.  Clinical  correlation with patient history and other diagnostic information is  necessary to determine patient infection status.  Positive results do  not rule out bacterial infection or co-infection with other viruses. If result is PRESUMPTIVE POSTIVE SARS-CoV-2 nucleic acids MAY BE PRESENT.   A presumptive positive result was  obtained on the submitted specimen  and confirmed on repeat testing.  While 2019 novel coronavirus  (SARS-CoV-2) nucleic acids may be present in the submitted sample  additional confirmatory testing may be necessary for epidemiological  and / or clinical management purposes  to differentiate between  SARS-CoV-2 and other Sarbecovirus currently known to infect humans.  If clinically indicated additional testing with an alternate test  methodology [phone]) is advised. The SARS-CoV-2 RNA is generally  detectable in upper and lower respiratory sp ecimens during the acute  phase of infection. The expected result is Negative. Fact Sheet for Patients:  StrictlyIdeas.no Fact Sheet for Healthcare Providers: BankingDealers.co.za This test is not yet approved or cleared by the [place] FDA and has been authorized for detection and/or diagnosis of SARS-CoV-2 by FDA under an Emergency Use Authorization (EUA).  This EUA will remain in effect (meaning this test can be used) for the duration of the COVID-19 declaration under Section 564(b)(1) of the Act, 21  U.S.C. section 360bbb-3(b)(1), unless the authorization is terminated or revoked sooner. Performed at [facility], [place] [address], [place], [place] [address]   Gram stain     Status: None   Collection Time: 04/12/19  3:40 PM   Specimen: Pleural, Right; Pleural Fluid  Result Value Ref Range Status   Specimen Description FLUID RIGHT PLEURAL  Final   Special Requests NONE  Final   Gram Stain   Final    WBC PRESENT,BOTH PMN AND MONONUCLEAR NO ORGANISMS SEEN CYTOSPIN SMEAR Performed at [facility], [place] [address], [place], [place] [address]    Report Status 04/12/2019 FINAL  Final  Culture, body fluid-bottle     Status: None   Collection Time: 04/12/19  3:40 PM   Specimen: Fluid  Result Value Ref Range Status   Specimen Description FLUID PLEURAL RIGHT  Final   Special Requests  BOTTLES DRAWN AEROBIC AND ANAEROBIC  Final   Culture   Final    NO GROWTH 5 DAYS Performed at [facility], [place] [address] [place], [place] [address]    Report Status 04/17/2019 FINAL  Final  MRSA PCR Screening     Status: None   Collection Time: 04/17/19  6:27 AM   Specimen: Nasopharyngeal Wash  Result Value Ref Range Status   MRSA by PCR NEGATIVE NEGATIVE Final    Comment:        The GeneXpert MRSA Assay (FDA approved for NASAL specimens only), is one component of a comprehensive MRSA colonization surveillance program. It is not intended to diagnose MRSA infection nor to guide or monitor treatment for MRSA infections. Performed at [facility], [place] [address], [place], [place] [address]   SARS Coronavirus 2 (CEPHEID - Performed in [place] hospital lab), Hosp Order     Status: None   Collection Time: 04/19/19 11:45 AM   Specimen: Nasopharyngeal Swab  Result Value Ref Range Status   SARS Coronavirus 2 NEGATIVE NEGATIVE Final    Comment: (NOTE) If result is NEGATIVE SARS-CoV-2 target nucleic acids are NOT DETECTED. The SARS-CoV-2 RNA is generally detectable in upper and lower  respiratory specimens during the acute phase of infection. The lowest  concentration of SARS-CoV-2 viral copies this assay can detect is 250  copies / mL. A negative result does not preclude SARS-CoV-2 infection  and should not be used as the sole basis for treatment or other  patient management decisions.  A negative result may occur with  improper specimen collection / handling, submission of specimen other  than nasopharyngeal swab, presence of viral mutation(s) within the  areas targeted by this assay, and inadequate number of viral copies  (<250 copies / mL). A negative result must be combined with clinical  observations, patient history, and epidemiological information. If result is POSITIVE SARS-CoV-2 target nucleic acids are DETECTED. The SARS-CoV-2 RNA is generally detectable  in upper and lower  respiratory specimens dur ing the acute phase of infection.  Positive  results are indicative of active infection with SARS-CoV-2.  Clinical  correlation with patient history and other diagnostic information is  necessary to determine patient infection status.  Positive results do  not rule out bacterial infection or co-infection with other viruses. If result is PRESUMPTIVE POSTIVE SARS-CoV-2 nucleic acids MAY BE PRESENT.   A presumptive positive result was obtained on the submitted specimen  and confirmed on repeat testing.  While 2019 novel coronavirus  (SARS-CoV-2) nucleic acids may be present in the submitted  sample  additional confirmatory testing may be necessary for epidemiological  and / or clinical management purposes  to differentiate between  SARS-CoV-2 and other Sarbecovirus currently known to infect humans.  If clinically indicated additional testing with an alternate test  methodology [phone]) is advised. The SARS-CoV-2 RNA is generally  detectable in upper and lower respiratory sp ecimens during the acute  phase of infection. The expected result is Negative. Fact Sheet for Patients:  StrictlyIdeas.no Fact Sheet for Healthcare Providers: BankingDealers.co.za This test is not yet approved or cleared by the [place] FDA and has been authorized for detection and/or diagnosis of SARS-CoV-2 by FDA under an Emergency Use Authorization (EUA).  This EUA will remain in effect (meaning this test can be used) for the duration of the COVID-19 declaration under Section 564(b)(1) of the Act, 21 U.S.C. section 360bbb-3(b)(1), unless the authorization is terminated or revoked sooner. Performed at [facility], [place] [address] [place], [name] [address]       Radiology Studies: No results found.      Scheduled Meds: . amLODipine  10 mg Oral Daily  . atorvastatin  20 mg Oral q1800  . calcium  carbonate  1 tablet Oral Q breakfast  . feeding supplement (ENSURE ENLIVE)  237 mL Oral BID BM  . ferrous sulfate  325 mg Oral Daily  . furosemide  40 mg Oral Daily  . hydrALAZINE  25 mg Oral TID  . lactose free nutrition  237 mL Oral TID WC  . latanoprost  1 drop Both Eyes QHS  . levothyroxine  112 mcg Oral Daily  . linaclotide  145 mcg Oral QAC breakfast  . mirtazapine  7.5 mg Oral QHS  . multivitamin with minerals  1 tablet Oral Daily  . polyethylene glycol  17 g Oral Daily  . vitamin B-12  500 mcg Oral Daily  . vitamin E  400 Units Oral Daily   Continuous Infusions:  Assessment & Plan:    1.  Chronic diastolic CHF with bilateral pleural effusion/acute respiratory failure: Present on admission. Initially diuresed with IV Lasix which was subsequently held in concern for AKI. Status post right-sided thoracentesis previously. Walking desaturation studies showed requirement of 3 L O2 on exertion and home O2 was set up anticipating discharge but patient dyspneic on exertion and repeat chest x-ray 7/18 showed worsening CHF. Patient also received 1 unit of PRBC on 7/17. Explained to patient and niece that euvolemia/respiratory comfort prioritized over optimal creatinine value. Patient was  monitored with resumption of oral Lasix at 40 mg daily and fortunately her renal function/creatinine level has remained stable. Patient felt tobenefit from left pleural effusion tap and scheduled for thoracentesis 7/20 through IR. COVID testing resulted negative but per nurse no response from IR this evening. She will  stay overnight and leave after tap in a.m. Will continue to hold Lovenox. Patient currently saturating well on 2 L O2.   2.  AKI on CKD stage III: Likely in the setting of diuretic/cardiorenal syndrome.  Creatinine during the hospital course peaked to 1.96 and down to 1.68 now.  No evidence of hyperkalemia or acidosis.  Unfortunately we will have to diurese patient to achieve optimal  balance of CHF control and abnormal creatinine.  Not on ACE inhibitors.  Will resume oral diuretics and monitor progress.  Thoracentesis may also reduce the need for high-dose diuretics and prevent readmissions.  3.  Acute on chronic iron deficiency anemia: Iron supplements.  Last stool for occult blood negative  in April.  Repeat test pending.  No signs of gross bleeding.  Status post 1 unit PRBC on July 17 with posttransfusion hemoglobin improved to 9.   4.  Diabetes mellitus type 2:?  Diet controlled.  Not on any home meds.  Hgb A1c 5.5  5. Hypertension/hyperlipidemia: Continue current medications.  6.  Hypothyroidism: Synthroid  7.  Severe protein calorie malnutrition: Continue oral supplementation.  DVT prophylaxis: Lovenox on hold for thoracocentesis in am Code Status: Full code Family / Patient Communication: Discussed with patient and discussed with niece Disposition Plan: Home with home health/PT/home O2 which have been set up and 24-hour supervision arranged by family.  I discussed with her niece this morning and plan for discharge today but it appears that thoracentesis will not be done tonight.  Will have discharge summary ready for discharging MD in a.m.     LOS: 8 days    Time spent: 35 minutes    Guilford Shi, MD [facility] Pager [phone]  If 7PM-7AM, please contact night-coverage [email or web address] Password [facility] 04/19/2019, 5:56 PM

## 2019-04-20 ENCOUNTER — Inpatient Hospital Stay (HOSPITAL_COMMUNITY): Payer: Medicare Other

## 2019-04-20 ENCOUNTER — Encounter (HOSPITAL_COMMUNITY): Payer: Self-pay | Admitting: Interventional Radiology

## 2019-04-20 DIAGNOSIS — I739 Peripheral vascular disease, unspecified: Secondary | ICD-10-CM

## 2019-04-20 HISTORY — PX: IR THORACENTESIS ASP PLEURAL SPACE W/IMG GUIDE: IMG5380

## 2019-04-20 LAB — BODY FLUID CELL COUNT WITH DIFFERENTIAL
Eos, Fluid: 0 %
Lymphs, Fluid: 57 %
Monocyte-Macrophage-Serous Fluid: 14 % — ABNORMAL LOW (ref 50–90)
Neutrophil Count, Fluid: 29 % — ABNORMAL HIGH (ref 0–25)
Total Nucleated Cell Count, Fluid: 524 cu mm (ref 0–1000)

## 2019-04-20 MED ORDER — LIDOCAINE HCL 1 % IJ SOLN
INTRAMUSCULAR | Status: AC
  Filled 2019-04-20: qty 20

## 2019-04-20 MED ORDER — LIDOCAINE HCL 1 % IJ SOLN
INTRAMUSCULAR | Status: DC | PRN
  Administered 2019-04-20: 10 mL

## 2019-04-20 NOTE — Discharge Summary (Signed)
Patient seen and examined at bedside this morning.  Patient was discharged yesterday awaiting thoracentesis.  Thoracentesis performed this morning.  800 cc of fluid removed.  Post procedure patient feels much better.  She does not have any complaints.  Her vital signs remained stable. Also spoke with the patient's nurse at bedside. Ambulatory pulse ox will be performed prior to her discharge. Chart reviewed.  Refer to discharge summary from 04/19/2019.  Call with further questions as needed.  Gerlean Ren MD [facility]

## 2019-04-20 NOTE — Procedures (Signed)
PROCEDURE SUMMARY:  Successful image-guided left thoracentesis. Yielded 800 milliliters of clear gold fluid. Patient tolerated procedure well. No immediate complications. EBL = 0 mL.  Specimen was sent for labs. CXR ordered.  Claris Pong Marketia Stallsmith PA-C 04/20/2019 9:41 AM

## 2019-04-20 NOTE — TOC Transition Note (Addendum)
Transition of Care [facility]) - CM/SW Discharge Note   Patient Details  Name: Gina [name] MRN: [id number] Date of Birth: 1928/12/24  [facility]) CM/SW Contact:  Zenon Mayo, RN Phone Number: 04/20/2019, 9:55 AM   Clinical Narrative:    Patient for dc today, she is active with [facility], this NCM contacted [name] to let her know patient is for dc today needing RN, PT, OT and aide.  Also patient has the home oxygen set up in her room already, but she will need another oxygen sat .  This NCM informed Risk manager.  NCM spoke with daughter [name] informed her that patient will be dc today, [name] will transport her home at dc.  Informed her that RN will contact her when patient is ready.   Final next level of care: [place] Barriers to Discharge: No Barriers Identified   Patient Goals and CMS Choice Patient states their goals for this hospitalization and ongoing recovery are:: get better CMS Medicare.gov Compare Post Acute Care list provided to:: Patient Represenative (must comment) Choice offered to / list presented to : Adult Children  Discharge Placement                       Discharge Plan and Services                DME Arranged: Oxygen DME Agency: [employer] Date DME Agency Contacted: 04/17/19 Time DME Agency Contacted: [id number] Representative spoke with at DME Agency: [place]: RN, PT, OT, Nurse's Aide [place] Agency: [facility] Date [place]: 04/20/19 Time [place]: [phone] Representative spoke with at [place]: [place] ([place]) Interventions     Readmission Risk Interventions Readmission Risk Prevention Plan 04/15/2019  Transportation Screening Complete  HRI or [place] Complete  [place] Not Applicable  Some recent data might be hidden

## 2019-04-20 NOTE — Progress Notes (Signed)
SATURATION QUALIFICATIONS: (This note is used to comply with regulatory documentation for home oxygen)  Patient Saturations on Room Air at Rest = 82%   

## 2019-04-22 ENCOUNTER — Telehealth (INDEPENDENT_AMBULATORY_CARE_PROVIDER_SITE_OTHER): Payer: Self-pay

## 2019-04-22 LAB — PATHOLOGIST SMEAR REVIEW

## 2019-04-22 NOTE — Telephone Encounter (Signed)
Transition Care Management Follow-up Telephone Call  Date of discharge and from where: 04/20/2019, [facility]   How have you been since you were released from the hospital? She said that she is feeling " pretty good."   Any questions or concerns? No questions/concerns reported.   Items Reviewed:  Did the pt receive and understand the discharge instructions provided? She has instructions, no questions reported   Medications obtained and verified? She said that she has all medications including the albuterol inhaler which is new.  Reviewed new order for lasix. Patient and her niece did not have any questions and did not want to review the medication list.   Any new allergies since your discharge? None reported   Do you have support at home? Yes, her niece., [name], is primary support  Other (ie: DME, Home Health, etc) O2 from [place]. She has been using it at 2-2.5L continuously. She said that 3L is too much.   [facility] home [facility] -SN/PT/OT/aide. Patient stated that the PT was there today.   She has a scale but doesn't weigh herself every day.  Reviewed when to call provider with weight gain.  Has walker and cane  Functional Questionnaire: (I = Independent and D = Dependent) ADL's: receives needed assistance from family.    Follow up appointments reviewed:    PCP Hospital f/u appt confirmed? 04/27/2019 @ 1430.  Informed her that she will be notified if it is in person or televisit.   [facility] f/u appt confirmed?pnly scheduled for neurology follow up 09/02/2019.   Are transportation arrangements needed? No  If their condition worsens, is the pt aware to call  their PCP or go to the ED?yes  Was the patient provided with contact information for the PCP's office or ED? She has the phone number   Was the pt encouraged to call back with questions or concerns? Yes

## 2019-04-27 ENCOUNTER — Encounter (INDEPENDENT_AMBULATORY_CARE_PROVIDER_SITE_OTHER): Payer: Self-pay | Admitting: Primary Care

## 2019-04-27 ENCOUNTER — Other Ambulatory Visit: Payer: Self-pay

## 2019-04-27 ENCOUNTER — Ambulatory Visit (INDEPENDENT_AMBULATORY_CARE_PROVIDER_SITE_OTHER): Payer: Medicare Other | Admitting: Primary Care

## 2019-04-27 VITALS — BP 172/62 | HR 75 | Temp 98.7°F | Ht 67.0 in | Wt 115.0 lb

## 2019-04-27 DIAGNOSIS — R5381 Other malaise: Secondary | ICD-10-CM

## 2019-04-27 DIAGNOSIS — D509 Iron deficiency anemia, unspecified: Secondary | ICD-10-CM

## 2019-04-27 DIAGNOSIS — K5900 Constipation, unspecified: Secondary | ICD-10-CM

## 2019-04-27 DIAGNOSIS — Z09 Encounter for follow-up examination after completed treatment for conditions other than malignant neoplasm: Secondary | ICD-10-CM

## 2019-04-27 DIAGNOSIS — E43 Unspecified severe protein-calorie malnutrition: Secondary | ICD-10-CM | POA: Diagnosis not present

## 2019-04-27 DIAGNOSIS — I10 Essential (primary) hypertension: Secondary | ICD-10-CM

## 2019-04-27 MED ORDER — AMLODIPINE BESYLATE 10 MG PO TABS: 10.0000 mg | ORAL_TABLET | Freq: Every day | ORAL | 3 refills | Status: DC

## 2019-04-27 MED ORDER — LEVOTHYROXINE SODIUM 112 MCG PO TABS: 112.0000 ug | ORAL_TABLET | Freq: Every day | ORAL | 3 refills | Status: DC

## 2019-04-27 MED ORDER — ALBUTEROL SULFATE HFA 108 (90 BASE) MCG/ACT IN AERS: 2.0000 | INHALATION_SPRAY | Freq: Four times a day (QID) | RESPIRATORY_TRACT | 1 refills | Status: DC | PRN

## 2019-04-27 MED ORDER — CALCIUM CARBONATE 1500 (600 CA) MG PO TABS: 600.0000 mg | ORAL_TABLET | Freq: Every day | ORAL | 3 refills | Status: DC

## 2019-04-27 MED ORDER — FERROUS SULFATE 325 (65 FE) MG PO TBEC: 325.0000 mg | DELAYED_RELEASE_TABLET | Freq: Every day | ORAL | 3 refills | Status: DC

## 2019-04-27 MED ORDER — LINACLOTIDE 145 MCG PO CAPS: 145.0000 ug | ORAL_CAPSULE | Freq: Every day | ORAL | 3 refills | Status: DC

## 2019-04-27 NOTE — Progress Notes (Signed)
Pt would like Rx for portable oxygen

## 2019-04-27 NOTE — Patient Instructions (Signed)
Protein-Energy Malnutrition Protein-energy malnutrition is when a person does not eat enough protein, fat, and calories. When this happens over time, it can lead to severe loss of muscle tissue (muscle wasting). This condition also affects the body's defense system (immune system) and can lead to other health problems. What are the causes? This condition may be caused by:  Not eating enough protein, fat, or calories.  Having certain chronic medical conditions.  Eating too little. What increases the risk? The following factors may make you more likely to develop this condition:  Living in poverty.  Long-term hospitalization.  Alcohol or drug dependency. Addiction often leads to a lifestyle in which proper diet is ignored. Dependency can also hurt the metabolism and the body's ability to absorb nutrients.  Eating disorders, such as anorexia nervosa or bulimia.  Chewing or swallowing problems. People with these disorders may not eat enough.  Having certain conditions, such as: ? Inflammatory bowel disease. Inflammation of the intestines makes it difficult for the body to absorb nutrients. ? Cancer or AIDS. These diseases can cause a loss of appetite. ? Chronic heart failure. This interferes with how the body uses nutrients. ? Cystic fibrosis. This disease can make it difficult for the body to absorb nutrients.  Eating a diet that extremely restricts protein, fat, or calorie intake. What are the signs or symptoms? Symptoms of this condition include:  Fatigue.  Weakness.  Dizziness.  Fainting.  Weight loss.  Loss of muscle tone and muscle mass.  Poor immune response.  Lack of menstruation.  Poor memory.  Hair loss.  Skin changes. How is this diagnosed? This condition may be diagnosed based on:  Your medical and dietary history.  A physical exam. This may include a measurement of your body mass index (BMI).  Blood tests. How is this treated? This condition may  be managed with:  Nutrition therapy. This may include working with a diet and nutrition specialist (dietitian).  Treatment for underlying conditions. People with severe protein-energy malnutrition may need to be treated in a hospital. This may involve receiving nutrition and fluids through an IV. Follow these instructions at home:   Eat a balanced diet. In each meal, include at least one food that is high in protein. Foods that are high in protein include: ? Meat. ? Poultry. ? Fish. ? Eggs. ? Cheese. ? Milk. ? Beans. ? Nuts.  Eat nutrient-rich foods that are easy to swallow and digest, such as: ? Fruit and yogurt smoothies. ? Oatmeal with nut butter.  Try to eat six small meals each day instead of three large meals.  Take vitamin and protein supplements as told by your health care provider or dietitian.  Follow your health care provider's recommendations about exercise and activity.  Keep all follow-up visits as told by your health care provider. This is important. Contact a health care provider if you:  Have increased weakness or fatigue.  Faint.  Are a woman and you stop having your period (menstruating).  Have rapid hair loss.  Have unexpected weight loss.  Have diarrhea.  Have nausea and vomiting. Get help right away if you have:  Difficulty breathing.  Chest pain. Summary  Protein-energy malnutrition is when a person does not eat enough protein, fat, and calories.  Protein-energy malnutrition can lead to severe loss of muscle tissue (muscle wasting). This condition also affects the body's defense system (immune system) and can lead to other health problems.  Talk with your health care provider about treatment for this   condition. Effective treatment depends on the underlying cause of the malnutrition. This information is not intended to replace advice given to you by your health care provider. Make sure you discuss any questions you have with your health  care provider. Document Released: 08/02/2005 Document Revised: 10/01/2017 Document Reviewed: 10/01/2017 Elsevier Patient Education  2020 [employer]  
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
## 2019-05-05 ENCOUNTER — Telehealth (INDEPENDENT_AMBULATORY_CARE_PROVIDER_SITE_OTHER): Payer: Self-pay | Admitting: Primary Care

## 2019-05-05 NOTE — Telephone Encounter (Signed)
Hold all Bp meds till Friday therapist [name] ck Bp several times and has a strong pulse 150/30. Call Friday before patient take Bp meds. If she cont. To have n/v and diarrhea go to ED for evaluation.

## 2019-05-06 ENCOUNTER — Other Ambulatory Visit: Payer: Self-pay

## 2019-05-06 DIAGNOSIS — I6523 Occlusion and stenosis of bilateral carotid arteries: Secondary | ICD-10-CM

## 2019-05-06 DIAGNOSIS — I739 Peripheral vascular disease, unspecified: Secondary | ICD-10-CM

## 2019-05-07 ENCOUNTER — Other Ambulatory Visit: Payer: Self-pay

## 2019-05-07 ENCOUNTER — Ambulatory Visit (HOSPITAL_COMMUNITY)
Admission: RE | Admit: 2019-05-07 | Discharge: 2019-05-07 | Disposition: A | Discharge status: Home / Self Care | Payer: Medicare Other | Source: Ambulatory Visit | Attending: Family | Admitting: Family

## 2019-05-07 ENCOUNTER — Ambulatory Visit (INDEPENDENT_AMBULATORY_CARE_PROVIDER_SITE_OTHER)
Admission: RE | Admit: 2019-05-07 | Discharge: 2019-05-07 | Disposition: A | Discharge status: Home / Self Care | Payer: Medicare Other | Source: Ambulatory Visit | Attending: Family | Admitting: Family

## 2019-05-07 DIAGNOSIS — I739 Peripheral vascular disease, unspecified: Secondary | ICD-10-CM

## 2019-05-18 ENCOUNTER — Telehealth (INDEPENDENT_AMBULATORY_CARE_PROVIDER_SITE_OTHER): Payer: Self-pay | Admitting: Primary Care

## 2019-05-18 NOTE — Telephone Encounter (Signed)
Patients Niece Leafy Ro called to request an update on a "POC" order to [facility], please follow up  [phone]

## 2019-05-21 ENCOUNTER — Telehealth (INDEPENDENT_AMBULATORY_CARE_PROVIDER_SITE_OTHER): Payer: Self-pay

## 2019-05-21 NOTE — Telephone Encounter (Signed)
Tillie Rung, physical therapist with [facility] called to report that patients BP was low and her heart rate was low. Patients apical heart rate was 53 and regular with a blood pressure of 10/40. Tillie Rung reported that since 8/5 patients diastolic pressure had been low. With reading of 138/32 on 8-5, 8-7 155/40, 8-8 152/40. Patient is not dizzy or lethargic. Nat Christen, CMA

## 2019-05-22 ENCOUNTER — Other Ambulatory Visit (INDEPENDENT_AMBULATORY_CARE_PROVIDER_SITE_OTHER): Payer: Self-pay | Admitting: Primary Care

## 2019-05-22 DIAGNOSIS — I10 Essential (primary) hypertension: Secondary | ICD-10-CM

## 2019-05-22 NOTE — Telephone Encounter (Signed)
Made aware of Bp PT has been following Bp each visit before therapy consulted with a cardiologist held 40mg  of lasix taking once daily refer to Dr. Einar Gip

## 2019-05-25 ENCOUNTER — Telehealth (HOSPITAL_COMMUNITY): Payer: Self-pay | Admitting: Rehabilitation

## 2019-05-25 NOTE — Telephone Encounter (Signed)

## 2019-05-26 ENCOUNTER — Encounter: Payer: Self-pay | Admitting: Family

## 2019-05-26 ENCOUNTER — Other Ambulatory Visit: Payer: Self-pay

## 2019-05-26 ENCOUNTER — Ambulatory Visit (INDEPENDENT_AMBULATORY_CARE_PROVIDER_SITE_OTHER): Payer: Medicare Other | Admitting: Family

## 2019-05-26 ENCOUNTER — Ambulatory Visit (HOSPITAL_COMMUNITY)
Admission: RE | Admit: 2019-05-26 | Discharge: 2019-05-26 | Disposition: A | Discharge status: Home / Self Care | Payer: Medicare Other | Source: Ambulatory Visit | Attending: Family | Admitting: Family

## 2019-05-26 VITALS — BP 191/70 | HR 78 | Temp 97.7°F | Resp 14 | Ht 67.0 in | Wt 117.4 lb

## 2019-05-26 DIAGNOSIS — I779 Disorder of arteries and arterioles, unspecified: Secondary | ICD-10-CM | POA: Diagnosis not present

## 2019-05-26 DIAGNOSIS — Z87891 Personal history of nicotine dependence: Secondary | ICD-10-CM | POA: Diagnosis not present

## 2019-05-26 DIAGNOSIS — Z95828 Presence of other vascular implants and grafts: Secondary | ICD-10-CM | POA: Diagnosis not present

## 2019-05-26 DIAGNOSIS — I6523 Occlusion and stenosis of bilateral carotid arteries: Secondary | ICD-10-CM
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
## 2019-05-26 NOTE — Patient Instructions (Addendum)
  To decrease swelling in your feet and legs: Elevate feet above slightly bent knees, feet above heart, overnight and 3-4 times per day for 20 minutes.   To measure for knee high compression hose: Measure the length of calf (from the crease of the knee to the bottom of the heel), largest circumference of calf, and ankle circumference first thing in the morning before your legs have a chance to swell.  Take these 3 measurements with you to obtain 20-30 mm mercury graduated knee high compression hose.  Put the stockings on in the morning, remove at bedtime.     Peripheral Vascular Disease  Peripheral vascular disease (PVD) is a disease of the blood vessels that are not part of your heart and brain. A simple term for PVD is poor circulation. In most cases, PVD narrows the blood vessels that carry blood from your heart to the rest of your body. This can reduce the supply of blood to your arms, legs, and internal organs, like your stomach or kidneys. However, PVD most often affects a person's lower legs and feet. Without treatment, PVD tends to get worse. PVD can also lead to acute ischemic limb. This is when an arm or leg suddenly cannot get enough blood. This is a medical emergency. Follow these instructions at home: Lifestyle  Do not use any products that contain nicotine or tobacco, such as cigarettes and e-cigarettes. If you need help quitting, ask your doctor.  Lose weight if you are overweight. Or, stay at a healthy weight as told by your doctor.  Eat a diet that is low in fat and cholesterol. If you need help, ask your doctor.  Exercise regularly. Ask your doctor for activities that are right for you. General instructions  Take over-the-counter and prescription medicines only as told by your doctor.  Take good care of your feet: ? Wear comfortable shoes that fit well. ? Check your feet often for any cuts or sores.  Keep all follow-up visits as told by your doctor This is  important. Contact a doctor if:  You have cramps in your legs when you walk.  You have leg pain when you are at rest.  You have coldness in a leg or foot.  Your skin changes.  You are unable to get or have an erection (erectile dysfunction).  You have cuts or sores on your feet that do not heal. Get help right away if:  Your arm or leg turns cold, numb, and blue.  Your arms or legs become red, warm, swollen, painful, or numb.  You have chest pain.  You have trouble breathing.  You suddenly have weakness in your face, arm, or leg.  You become very confused or you cannot speak.  You suddenly have a very bad headache.  You suddenly cannot see. Summary  Peripheral vascular disease (PVD) is a disease of the blood vessels.  A simple term for PVD is poor circulation. Without treatment, PVD tends to get worse.  Treatment may include exercise, low fat and low cholesterol diet, and quitting smoking. This information is not intended to replace advice given to you by your health care provider. Make sure you discuss any questions you have with your health care provider. Document Released: 12/11/2009 Document Revised: 08/29/2017 Document Reviewed: 10/24/2016 Elsevier Patient Education  2020 [employer].

## 2019-05-31 ENCOUNTER — Other Ambulatory Visit (INDEPENDENT_AMBULATORY_CARE_PROVIDER_SITE_OTHER): Payer: Self-pay | Admitting: Primary Care

## 2019-05-31 MED ORDER — FUROSEMIDE 20 MG PO TABS: 40.0000 mg | ORAL_TABLET | Freq: Every day | ORAL | 3 refills | Status: DC

## 2019-05-31 MED ORDER — HYDRALAZINE HCL 25 MG PO TABS: 25.0000 mg | ORAL_TABLET | Freq: Three times a day (TID) | ORAL | 3 refills | Status: DC

## 2019-06-08 ENCOUNTER — Other Ambulatory Visit: Payer: Self-pay | Admitting: Family Medicine

## 2019-06-08 DIAGNOSIS — I10 Essential (primary) hypertension: Secondary | ICD-10-CM

## 2019-07-06 ENCOUNTER — Other Ambulatory Visit (INDEPENDENT_AMBULATORY_CARE_PROVIDER_SITE_OTHER): Payer: Self-pay | Admitting: Primary Care

## 2019-07-06 DIAGNOSIS — J9601 Acute respiratory failure with hypoxia: Secondary | ICD-10-CM

## 2019-08-12 ENCOUNTER — Other Ambulatory Visit: Payer: Self-pay | Admitting: Primary Care

## 2019-08-13 ENCOUNTER — Other Ambulatory Visit (INDEPENDENT_AMBULATORY_CARE_PROVIDER_SITE_OTHER): Payer: Self-pay | Admitting: Primary Care

## 2019-08-13 NOTE — Telephone Encounter (Signed)
FWD to PCP. Tempestt S Roberts, CMA  

## 2019-08-19 ENCOUNTER — Ambulatory Visit (INDEPENDENT_AMBULATORY_CARE_PROVIDER_SITE_OTHER): Payer: Medicare Other | Admitting: Primary Care

## 2019-08-19 ENCOUNTER — Encounter (INDEPENDENT_AMBULATORY_CARE_PROVIDER_SITE_OTHER): Payer: Self-pay | Admitting: Primary Care

## 2019-08-19 ENCOUNTER — Other Ambulatory Visit: Payer: Self-pay

## 2019-08-19 VITALS — BP 158/73 | HR 49 | Temp 97.5°F | Ht 67.0 in | Wt 116.0 lb

## 2019-08-19 DIAGNOSIS — M79674 Pain in right toe(s): Secondary | ICD-10-CM

## 2019-08-19 DIAGNOSIS — E039 Hypothyroidism, unspecified: Secondary | ICD-10-CM

## 2019-08-19 DIAGNOSIS — Z89411 Acquired absence of right great toe: Secondary | ICD-10-CM | POA: Diagnosis not present

## 2019-08-19 DIAGNOSIS — E7800 Pure hypercholesterolemia, unspecified: Secondary | ICD-10-CM

## 2019-08-19 DIAGNOSIS — K5900 Constipation, unspecified: Secondary | ICD-10-CM

## 2019-08-19 DIAGNOSIS — I10 Essential (primary) hypertension: Secondary | ICD-10-CM | POA: Diagnosis not present

## 2019-08-19 DIAGNOSIS — E43 Unspecified severe protein-calorie malnutrition: Secondary | ICD-10-CM | POA: Diagnosis not present

## 2019-08-19 DIAGNOSIS — I739 Peripheral vascular disease, unspecified: Secondary | ICD-10-CM

## 2019-08-19 MED ORDER — FERROUS SULFATE 325 (65 FE) MG PO TBEC: 325.0000 mg | DELAYED_RELEASE_TABLET | Freq: Every day | ORAL | 3 refills | Status: DC

## 2019-08-19 MED ORDER — LEVOTHYROXINE SODIUM 112 MCG PO TABS: 112.0000 ug | ORAL_TABLET | Freq: Every day | ORAL | 3 refills | Status: DC

## 2019-08-19 MED ORDER — FUROSEMIDE 20 MG PO TABS: 40.0000 mg | ORAL_TABLET | Freq: Every day | ORAL | 3 refills | Status: DC

## 2019-08-19 MED ORDER — ALBUTEROL SULFATE HFA 108 (90 BASE) MCG/ACT IN AERS: 2.0000 | INHALATION_SPRAY | Freq: Four times a day (QID) | RESPIRATORY_TRACT | 1 refills | Status: DC | PRN

## 2019-08-19 MED ORDER — ATORVASTATIN CALCIUM 20 MG PO TABS: 20.0000 mg | ORAL_TABLET | Freq: Every day | ORAL | 1 refills | Status: DC

## 2019-08-19 MED ORDER — MIRTAZAPINE 15 MG PO TABS: 7.5000 mg | ORAL_TABLET | Freq: Every day | ORAL | 3 refills | Status: DC

## 2019-08-19 MED ORDER — HYDRALAZINE HCL 25 MG PO TABS: 25.0000 mg | ORAL_TABLET | Freq: Three times a day (TID) | ORAL | 3 refills | Status: DC

## 2019-08-19 MED ORDER — AMLODIPINE BESYLATE 10 MG PO TABS: 10.0000 mg | ORAL_TABLET | Freq: Every day | ORAL | 3 refills | Status: DC

## 2019-08-19 MED ORDER — CALCIUM CARBONATE 1500 (600 CA) MG PO TABS: 600.0000 mg | ORAL_TABLET | Freq: Every day | ORAL | 3 refills | Status: DC

## 2019-08-19 MED ORDER — LINACLOTIDE 145 MCG PO CAPS: 145.0000 ug | ORAL_CAPSULE | Freq: Every day | ORAL | 3 refills | Status: DC
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
## 2019-08-19 NOTE — Patient Instructions (Signed)
Edema  Edema is when you have too much fluid in your body or under your skin. Edema may make your legs, feet, and ankles swell up. Swelling is also common in looser tissues, like around your eyes. This is a common condition. It gets more common as you get older. There are many possible causes of edema. Eating too much salt (sodium) and being on your feet or sitting for a long time can cause edema in your legs, feet, and ankles. Hot weather may make edema worse. Edema is usually painless. Your skin may look swollen or shiny. Follow these instructions at home:  Keep the swollen body part raised (elevated) above the level of your heart when you are sitting or lying down.  Do not sit still or stand for a long time.  Do not wear tight clothes. Do not wear garters on your upper legs.  Exercise your legs. This can help the swelling go down.  Wear elastic bandages or support stockings as told by your doctor.  Eat a low-salt (low-sodium) diet to reduce fluid as told by your doctor.  Depending on the cause of your swelling, you may need to limit how much fluid you drink (fluid restriction).  Take over-the-counter and prescription medicines only as told by your doctor. Contact a doctor if:  Treatment is not working.  You have heart, liver, or kidney disease and have symptoms of edema.  You have sudden and unexplained weight gain. Get help right away if:  You have shortness of breath or chest pain.  You cannot breathe when you lie down.  You have pain, redness, or warmth in the swollen areas.  You have heart, liver, or kidney disease and get edema all of a sudden.  You have a fever and your symptoms get worse all of a sudden. Summary  Edema is when you have too much fluid in your body or under your skin.  Edema may make your legs, feet, and ankles swell up. Swelling is also common in looser tissues, like around your eyes.  Raise (elevate) the swollen body part above the level of your  heart when you are sitting or lying down.  Follow your doctor's instructions about diet and how much fluid you can drink (fluid restriction). This information is not intended to replace advice given to you by your health care provider. Make sure you discuss any questions you have with your health care provider. Document Released: 03/04/2008 Document Revised: 09/19/2017 Document Reviewed: 10/04/2016 Elsevier Patient Education  2020 [employer]  

## 2019-08-20 LAB — CBC WITH DIFFERENTIAL/PLATELET
Basophils Absolute: 0 10*3/uL (ref 0.0–0.2)
Basos: 1 %
EOS (ABSOLUTE): 0.1 10*3/uL (ref 0.0–0.4)
Eos: 3 %
Hematocrit: 26.7 % — ABNORMAL LOW (ref 34.0–46.6)
Hemoglobin: 8.3 g/dL — ABNORMAL LOW (ref 11.1–15.9)
Immature Grans (Abs): 0 10*3/uL (ref 0.0–0.1)
Immature Granulocytes: 0 %
Lymphocytes Absolute: 1 10*3/uL (ref 0.7–3.1)
Lymphs: 27 %
MCH: 27.7 pg (ref 26.6–33.0)
MCHC: 31.1 g/dL — ABNORMAL LOW (ref 31.5–35.7)
MCV: 89 fL (ref 79–97)
Monocytes Absolute: 0.3 10*3/uL (ref 0.1–0.9)
Monocytes: 9 %
Neutrophils Absolute: 2.3 10*3/uL (ref 1.4–7.0)
Neutrophils: 60 %
Platelets: 299 10*3/uL (ref 150–450)
RBC: 3 x10E6/uL — ABNORMAL LOW (ref 3.77–5.28)
RDW: 13.8 % (ref 11.7–15.4)
WBC: 3.8 10*3/uL (ref 3.4–10.8)

## 2019-08-20 LAB — LIPID PANEL
Chol/HDL Ratio: 1.9 ratio (ref 0.0–4.4)
Cholesterol, Total: 140 mg/dL (ref 100–199)
HDL: 72 mg/dL (ref >39)
LDL Chol Calc (NIH): 52 mg/dL (ref 0–99)
Triglycerides: 83 mg/dL (ref 0–149)
VLDL Cholesterol Cal: 16 mg/dL (ref 5–40)

## 2019-08-20 LAB — URIC ACID: Uric Acid: 11.2 mg/dL — ABNORMAL HIGH (ref 2.5–7.1)

## 2019-08-20 LAB — TSH+FREE T4
Free T4: 1.73 ng/dL (ref 0.82–1.77)
TSH: 5.06 u[IU]/mL — ABNORMAL HIGH (ref 0.450–4.500)

## 2019-08-25 ENCOUNTER — Other Ambulatory Visit (INDEPENDENT_AMBULATORY_CARE_PROVIDER_SITE_OTHER): Payer: Self-pay | Admitting: Primary Care

## 2019-08-25 MED ORDER — PREDNISONE 20 MG PO TABS: 30.0000 mg | ORAL_TABLET | Freq: Every day | ORAL | 0 refills | Status: DC

## 2019-09-02 ENCOUNTER — Encounter: Payer: Self-pay | Admitting: Family Medicine

## 2019-09-02 ENCOUNTER — Ambulatory Visit: Payer: Medicare Other | Admitting: Family Medicine
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
## 2019-09-12 ENCOUNTER — Other Ambulatory Visit: Payer: Self-pay

## 2019-09-12 ENCOUNTER — Inpatient Hospital Stay (HOSPITAL_COMMUNITY)
Admission: EM | Admit: 2019-09-12 | Discharge: 2019-09-26 | DRG: 291 | Disposition: A | Discharge status: Home Health | Payer: Medicare Other | Attending: Internal Medicine | Admitting: Internal Medicine

## 2019-09-12 ENCOUNTER — Emergency Department (HOSPITAL_COMMUNITY): Payer: Medicare Other

## 2019-09-12 ENCOUNTER — Encounter (HOSPITAL_COMMUNITY): Payer: Self-pay | Admitting: Emergency Medicine

## 2019-09-12 DIAGNOSIS — I700 Atherosclerosis of aorta: Secondary | ICD-10-CM | POA: Diagnosis present

## 2019-09-12 DIAGNOSIS — R54 Age-related physical debility: Secondary | ICD-10-CM | POA: Diagnosis present

## 2019-09-12 DIAGNOSIS — Z79899 Other long term (current) drug therapy: Secondary | ICD-10-CM

## 2019-09-12 DIAGNOSIS — D649 Anemia, unspecified: Secondary | ICD-10-CM | POA: Diagnosis not present

## 2019-09-12 DIAGNOSIS — K559 Vascular disorder of intestine, unspecified: Secondary | ICD-10-CM | POA: Diagnosis present

## 2019-09-12 DIAGNOSIS — I340 Nonrheumatic mitral (valve) insufficiency: Secondary | ICD-10-CM | POA: Diagnosis not present

## 2019-09-12 DIAGNOSIS — F329 Major depressive disorder, single episode, unspecified: Secondary | ICD-10-CM | POA: Diagnosis present

## 2019-09-12 DIAGNOSIS — R0602 Shortness of breath: Secondary | ICD-10-CM

## 2019-09-12 DIAGNOSIS — Z20828 Contact with and (suspected) exposure to other viral communicable diseases: Secondary | ICD-10-CM | POA: Diagnosis present

## 2019-09-12 DIAGNOSIS — Z9011 Acquired absence of right breast and nipple: Secondary | ICD-10-CM

## 2019-09-12 DIAGNOSIS — Z853 Personal history of malignant neoplasm of breast: Secondary | ICD-10-CM

## 2019-09-12 DIAGNOSIS — Z9010 Acquired absence of unspecified breast and nipple: Secondary | ICD-10-CM | POA: Diagnosis not present

## 2019-09-12 DIAGNOSIS — J811 Chronic pulmonary edema: Secondary | ICD-10-CM | POA: Diagnosis present

## 2019-09-12 DIAGNOSIS — R946 Abnormal results of thyroid function studies: Secondary | ICD-10-CM | POA: Diagnosis present

## 2019-09-12 DIAGNOSIS — Z8711 Personal history of peptic ulcer disease: Secondary | ICD-10-CM

## 2019-09-12 DIAGNOSIS — I361 Nonrheumatic tricuspid (valve) insufficiency: Secondary | ICD-10-CM | POA: Diagnosis not present

## 2019-09-12 DIAGNOSIS — Z8049 Family history of malignant neoplasm of other genital organs: Secondary | ICD-10-CM

## 2019-09-12 DIAGNOSIS — M1612 Unilateral primary osteoarthritis, left hip: Secondary | ICD-10-CM | POA: Diagnosis present

## 2019-09-12 DIAGNOSIS — I130 Hypertensive heart and chronic kidney disease with heart failure and stage 1 through stage 4 chronic kidney disease, or unspecified chronic kidney disease: Principal | ICD-10-CM | POA: Diagnosis present

## 2019-09-12 DIAGNOSIS — Z7982 Long term (current) use of aspirin: Secondary | ICD-10-CM | POA: Diagnosis not present

## 2019-09-12 DIAGNOSIS — D500 Iron deficiency anemia secondary to blood loss (chronic): Secondary | ICD-10-CM | POA: Diagnosis not present

## 2019-09-12 DIAGNOSIS — R001 Bradycardia, unspecified: Secondary | ICD-10-CM | POA: Diagnosis not present

## 2019-09-12 DIAGNOSIS — E1122 Type 2 diabetes mellitus with diabetic chronic kidney disease: Secondary | ICD-10-CM | POA: Diagnosis present

## 2019-09-12 DIAGNOSIS — I739 Peripheral vascular disease, unspecified: Secondary | ICD-10-CM | POA: Diagnosis not present

## 2019-09-12 DIAGNOSIS — I5033 Acute on chronic diastolic (congestive) heart failure: Secondary | ICD-10-CM | POA: Diagnosis present

## 2019-09-12 DIAGNOSIS — E1151 Type 2 diabetes mellitus with diabetic peripheral angiopathy without gangrene: Secondary | ICD-10-CM | POA: Diagnosis present

## 2019-09-12 DIAGNOSIS — J90 Pleural effusion, not elsewhere classified: Secondary | ICD-10-CM | POA: Diagnosis present

## 2019-09-12 DIAGNOSIS — E785 Hyperlipidemia, unspecified: Secondary | ICD-10-CM | POA: Diagnosis present

## 2019-09-12 DIAGNOSIS — J9601 Acute respiratory failure with hypoxia: Secondary | ICD-10-CM | POA: Diagnosis present

## 2019-09-12 DIAGNOSIS — N179 Acute kidney failure, unspecified: Secondary | ICD-10-CM | POA: Diagnosis present

## 2019-09-12 DIAGNOSIS — Z8615 Personal history of latent tuberculosis infection: Secondary | ICD-10-CM

## 2019-09-12 DIAGNOSIS — I4891 Unspecified atrial fibrillation: Secondary | ICD-10-CM | POA: Diagnosis not present

## 2019-09-12 DIAGNOSIS — Z87891 Personal history of nicotine dependence: Secondary | ICD-10-CM | POA: Diagnosis not present

## 2019-09-12 DIAGNOSIS — R0902 Hypoxemia: Secondary | ICD-10-CM

## 2019-09-12 DIAGNOSIS — Z7952 Long term (current) use of systemic steroids: Secondary | ICD-10-CM

## 2019-09-12 DIAGNOSIS — E039 Hypothyroidism, unspecified: Secondary | ICD-10-CM | POA: Diagnosis present

## 2019-09-12 DIAGNOSIS — Z9981 Dependence on supplemental oxygen: Secondary | ICD-10-CM

## 2019-09-12 DIAGNOSIS — D509 Iron deficiency anemia, unspecified: Secondary | ICD-10-CM | POA: Diagnosis present

## 2019-09-12 DIAGNOSIS — N183 Chronic kidney disease, stage 3 unspecified: Secondary | ICD-10-CM | POA: Diagnosis not present

## 2019-09-12 DIAGNOSIS — Z9114 Patient's other noncompliance with medication regimen: Secondary | ICD-10-CM | POA: Diagnosis not present

## 2019-09-12 DIAGNOSIS — J9621 Acute and chronic respiratory failure with hypoxia: Secondary | ICD-10-CM | POA: Diagnosis present

## 2019-09-12 DIAGNOSIS — J918 Pleural effusion in other conditions classified elsewhere: Secondary | ICD-10-CM | POA: Diagnosis present

## 2019-09-12 DIAGNOSIS — I10 Essential (primary) hypertension: Secondary | ICD-10-CM | POA: Diagnosis not present

## 2019-09-12 DIAGNOSIS — Z9889 Other specified postprocedural states: Secondary | ICD-10-CM

## 2019-09-12 DIAGNOSIS — Z79890 Hormone replacement therapy (postmenopausal): Secondary | ICD-10-CM | POA: Diagnosis not present

## 2019-09-12 DIAGNOSIS — N1832 Chronic kidney disease, stage 3b: Secondary | ICD-10-CM | POA: Diagnosis present

## 2019-09-12 DIAGNOSIS — R040 Epistaxis: Secondary | ICD-10-CM | POA: Diagnosis not present

## 2019-09-12 DIAGNOSIS — I499 Cardiac arrhythmia, unspecified: Secondary | ICD-10-CM | POA: Diagnosis not present

## 2019-09-12 LAB — TROPONIN I (HIGH SENSITIVITY)
Troponin I (High Sensitivity): 40 ng/L — ABNORMAL HIGH (ref <18)
Troponin I (High Sensitivity): 43 ng/L — ABNORMAL HIGH (ref <18)

## 2019-09-12 LAB — BASIC METABOLIC PANEL
Anion gap: 13 (ref 5–15)
BUN: 48 mg/dL — ABNORMAL HIGH (ref 8–23)
CO2: 21 mmol/L — ABNORMAL LOW (ref 22–32)
Calcium: 8.9 mg/dL (ref 8.9–10.3)
Chloride: 102 mmol/L (ref 98–111)
Creatinine, Ser: 2.12 mg/dL — ABNORMAL HIGH (ref 0.44–1.00)
GFR calc Af Amer: 23 mL/min — ABNORMAL LOW (ref >60)
GFR calc non Af Amer: 20 mL/min — ABNORMAL LOW (ref >60)
Glucose, Bld: 112 mg/dL — ABNORMAL HIGH (ref 70–99)
Potassium: 4 mmol/L (ref 3.5–5.1)
Sodium: 136 mmol/L (ref 135–145)

## 2019-09-12 LAB — CBC
HCT: 24.5 % — ABNORMAL LOW (ref 36.0–46.0)
Hemoglobin: 7.4 g/dL — ABNORMAL LOW (ref 12.0–15.0)
MCH: 25.5 pg — ABNORMAL LOW (ref 26.0–34.0)
MCHC: 30.2 g/dL (ref 30.0–36.0)
MCV: 84.5 fL (ref 80.0–100.0)
Platelets: 279 10*3/uL (ref 150–400)
RBC: 2.9 MIL/uL — ABNORMAL LOW (ref 3.87–5.11)
RDW: 16.2 % — ABNORMAL HIGH (ref 11.5–15.5)
WBC: 3.4 10*3/uL — ABNORMAL LOW (ref 4.0–10.5)
nRBC: 0 % (ref 0.0–0.2)

## 2019-09-12 LAB — BRAIN NATRIURETIC PEPTIDE: B Natriuretic Peptide: 2054.2 pg/mL — ABNORMAL HIGH (ref 0.0–100.0)

## 2019-09-12 MED ORDER — MIRTAZAPINE 15 MG PO TABS
7.5000 mg | ORAL_TABLET | Freq: Every day | ORAL | Status: DC
  Administered 2019-09-12 – 2019-09-25 (×14): 7.5 mg via ORAL
  Filled 2019-09-12 (×14): qty 1

## 2019-09-12 MED ORDER — FUROSEMIDE 10 MG/ML IJ SOLN
40.0000 mg | Freq: Once | INTRAMUSCULAR | Status: AC
  Administered 2019-09-12: 40 mg via INTRAVENOUS
  Filled 2019-09-12: qty 4

## 2019-09-12 MED ORDER — AMLODIPINE BESYLATE 10 MG PO TABS
10.0000 mg | ORAL_TABLET | Freq: Every day | ORAL | Status: DC
  Administered 2019-09-13 – 2019-09-26 (×14): 10 mg via ORAL
  Filled 2019-09-12 (×14): qty 1

## 2019-09-12 MED ORDER — NETARSUDIL DIMESYLATE 0.02 % OP SOLN: 1.0000 [drp] | Freq: Every day | OPHTHALMIC | Status: DC

## 2019-09-12 MED ORDER — ASPIRIN EC 81 MG PO TBEC
81.0000 mg | DELAYED_RELEASE_TABLET | Freq: Every day | ORAL | Status: DC
  Administered 2019-09-12 – 2019-09-26 (×15): 81 mg via ORAL
  Filled 2019-09-12 (×15): qty 1

## 2019-09-12 MED ORDER — ENOXAPARIN SODIUM 30 MG/0.3ML ~~LOC~~ SOLN: 30.0000 mg | SUBCUTANEOUS | Status: DC

## 2019-09-12 MED ORDER — LINACLOTIDE 145 MCG PO CAPS
145.0000 ug | ORAL_CAPSULE | Freq: Every day | ORAL | Status: DC
  Administered 2019-09-13 – 2019-09-22 (×7): 145 ug via ORAL
  Filled 2019-09-12 (×15): qty 1

## 2019-09-12 MED ORDER — ALBUTEROL SULFATE HFA 108 (90 BASE) MCG/ACT IN AERS
2.0000 | INHALATION_SPRAY | Freq: Four times a day (QID) | RESPIRATORY_TRACT | Status: DC | PRN
  Filled 2019-09-12: qty 6.7

## 2019-09-12 MED ORDER — SODIUM CHLORIDE 0.9% FLUSH: 3.0000 mL | Freq: Once | INTRAVENOUS | Status: DC

## 2019-09-12 MED ORDER — LATANOPROST 0.005 % OP SOLN
1.0000 [drp] | Freq: Every day | OPHTHALMIC | Status: DC
  Administered 2019-09-12 – 2019-09-25 (×14): 1 [drp] via OPHTHALMIC
  Filled 2019-09-12: qty 2.5

## 2019-09-12 MED ORDER — LEVOTHYROXINE SODIUM 112 MCG PO TABS
112.0000 ug | ORAL_TABLET | Freq: Every day | ORAL | Status: DC
  Administered 2019-09-13 – 2019-09-26 (×14): 112 ug via ORAL
  Filled 2019-09-12 (×14): qty 1

## 2019-09-12 MED ORDER — VITAMIN E 180 MG (400 UNIT) PO CAPS
400.0000 [IU] | ORAL_CAPSULE | Freq: Every day | ORAL | Status: DC
  Administered 2019-09-12 – 2019-09-26 (×15): 400 [IU] via ORAL
  Filled 2019-09-12 (×15): qty 1

## 2019-09-12 MED ORDER — ATORVASTATIN CALCIUM 10 MG PO TABS
20.0000 mg | ORAL_TABLET | Freq: Every day | ORAL | Status: DC
  Administered 2019-09-13 – 2019-09-25 (×13): 20 mg via ORAL
  Filled 2019-09-12 (×13): qty 2

## 2019-09-12 MED ORDER — LATANOPROST 0.005 % OP SOLN: 1.0000 [drp] | Freq: Every day | OPHTHALMIC | Status: DC

## 2019-09-12 MED ORDER — CALCIUM CARBONATE 1250 (500 CA) MG PO TABS
500.0000 mg | ORAL_TABLET | Freq: Every day | ORAL | Status: DC
  Administered 2019-09-13 – 2019-09-26 (×14): 500 mg via ORAL
  Filled 2019-09-12 (×15): qty 1

## 2019-09-12 MED ORDER — FERROUS SULFATE 325 (65 FE) MG PO TABS
325.0000 mg | ORAL_TABLET | Freq: Every day | ORAL | Status: DC
  Administered 2019-09-13 – 2019-09-26 (×14): 325 mg via ORAL
  Filled 2019-09-12 (×15): qty 1

## 2019-09-12 MED ORDER — VITAMIN B-12 1000 MCG PO TABS
500.0000 ug | ORAL_TABLET | Freq: Every day | ORAL | Status: DC
  Administered 2019-09-13 – 2019-09-26 (×14): 500 ug via ORAL
  Filled 2019-09-12 (×15): qty 1

## 2019-09-12 MED ORDER — ALBUTEROL SULFATE (2.5 MG/3ML) 0.083% IN NEBU: 2.5000 mg | INHALATION_SOLUTION | Freq: Four times a day (QID) | RESPIRATORY_TRACT | Status: DC | PRN

## 2019-09-12 NOTE — H&P (Signed)
Date: 09/12/2019               Patient Name:  Gina [name] MRN: [id number]  DOB: 1929-03-23 Age / Sex: 85 y.o., female   PCP: [name], NP         Medical Service: Internal Medicine Teaching Service         Attending Physician: Dr. Evette Doffing, Mallie Mussel, *    First Contact: Dr. Benjamine Mola Pager: [phone]  Second Contact: Dr. Myrtie Hawk Pager: [phone]       After Hours (After 5p/  First Contact Pager: [phone]  weekends / holidays): Second Contact Pager: [phone]   Chief Complaint: Shortness of breath  History of Present Illness: This is a 85 year old female with a history of HFpEF (Echo in 03/28/19 EF 60-65%, DD, HTN, PVD, HLD, CKD stage IIIb, diet controlled DM, hypothyroidism, anemia, mesenteric ischemia, and breast carcinoma s/p mastectomy who presented with shortness of breath and generalized swelling.  She reports that about 2 weeks ago she started noticing worsening shortness of breath, having difficulty with ambulation and became very shortness of breath with short distances.  Endorses a 2 week history of diffuse swelling in her legs.  She has been having orthopnea, PND, and decreased appetite. She denies any chest pain, fevers, chills, nausea, vomiting, headaches, lightheadedness, dizziness or other symptoms.  Denies any issues with urination.  Denies any recent sick contacts or recent travel.  She reports that she has been taking Lasix 40 mg 2-3 times per day, does not check her weights at home.  She drinks about 2-3 bottles of water per day, reports that she does not consume a lot of salt.  She reports that she does not check her weights at home.  She was recently admitted from 7/12-7/21 for acute on chronic congestive heart failure. She was noted to be hypoxic requiring supplemental oxygen, treated with IV Lasix however due to worsening renal failure it was felt that a thoracentesis would be beneficial, drained about 800 cc of fluid.  Fluid pathology showed mixed  inflammatory cells, mildly increased neutrophil count of 29%.  Weight at day of discharge was 51.2 kg.  In the ED she was noted to be afebrile, HR in the 60s, RR up to 21, hypertensive. Labs significant for Cr 2.12 (BL 1.6), BNP 2,054. Troponin's 40 > 43. CBC showed WBC 3.4, Hgb 7.4, platelets 279.  EKG showed heart rate in the 60s, irregularly irregular rhythm, right axis deviation. Chest x-ray showed large bilateral pleural effusions with underlying atelectatic changes, significantly increased from prior exam.  She was admitted to internal medicine.  Meds:  Current Meds  Medication Sig  . albuterol (VENTOLIN HFA) 108 (90 Base) MCG/ACT inhaler Inhale 2 puffs into the lungs every 6 (six) hours as needed for wheezing or shortness of breath.  Marland Kitchen amLODipine (NORVASC) 10 MG tablet Take 1 tablet (10 mg total) by mouth daily.  Marland Kitchen aspirin EC 81 MG tablet Take 81 mg by mouth daily.  Marland Kitchen atorvastatin (LIPITOR) 20 MG tablet Take 1 tablet (20 mg total) by mouth daily at 6 PM.  . bimatoprost (LUMIGAN) 0.01 % SOLN Place 1 drop into both eyes at bedtime.  . calcium carbonate (OSCAL) 1500 (600 Ca) MG TABS tablet Take 1 tablet (1,500 mg total) by mouth daily.  . ferrous sulfate 325 (65 FE) MG EC tablet Take 1 tablet (325 mg total) by mouth daily.  . furosemide (LASIX) 20 MG tablet Take 2 tablets (40 mg  total) by mouth daily. Take 1 tablet (40mg  total) by mouth daily  . hydrALAZINE (APRESOLINE) 25 MG tablet Take 1 tablet (25 mg total) by mouth 3 (three) times daily.  Marland Kitchen latanoprost (XALATAN) 0.005 % ophthalmic solution Place 1 drop into both eyes at bedtime.  Marland Kitchen levothyroxine (SYNTHROID) 112 MCG tablet Take 1 tablet (112 mcg total) by mouth daily.  Marland Kitchen linaclotide (LINZESS) 145 MCG CAPS capsule Take 1 capsule (145 mcg total) by mouth daily before breakfast.  . mirtazapine (REMERON) 15 MG tablet Take 0.5 tablets (7.5 mg total) by mouth at bedtime.  . predniSONE (DELTASONE) 20 MG tablet Take 1.5 tablets (30 mg total) by  mouth daily with breakfast.  . RHOPRESSA 0.02 % SOLN Place 1 drop into both eyes at bedtime.  . vitamin B-12 (CYANOCOBALAMIN) 500 MCG tablet Take 500 mcg by mouth daily.  . vitamin E 400 UNIT capsule Take 400 Units by mouth daily.     Allergies: Allergies as of 09/12/2019  . (No Known Allergies)   Past Medical History:  Diagnosis Date  . Acute diastolic congestive heart failure ([place]) 01/28/2013  . AKI (acute kidney injury) ([place]) 10/24/2016  . Anemia 11/30/2014  . Arthritis    lt hip  . Depression   . Diabetes mellitus, type 2 ([place])    Well controlled  . Graves disease    HX OF GRAVES  . High cholesterol   . Hyperlipidemia   . Hypertension   . Intraductal carcinoma 06/2003   Of right breast. s/p right partial mastectomy. // Followed by Dr. Marylene Buerger  . Mesenteric ischemia ([place])   . PVD (peripheral vascular disease) ([place])    S/P BL femoral-popliteal bypass surgery 03/2001 (right) and 01/2003 (left)  . TB lung, latent    Treated with INH in 11/2007  . Thyroid disease    Graves disease  . Transfusion history    last admission 11-30-14  . Ulcer    gastric antral ulcer and AVMs     Family History: Patient denies any significant family history.  Family History  Problem Relation Age of Onset  . Cancer Daughter 70       uterine cancer  . Cancer Father   . Dementia Neg Hx     Social History: Patient is currently living alone, has nursing aide that comes out about once a day.  Denies any current smoking history, quit smoking about 2 years ago.  Denies any alcohol or recreational drug use.  Review of Systems: A complete ROS was negative except as per HPI.   Physical Exam: Blood pressure (!) 147/50, pulse 75, temperature 98.1 F (36.7 C), temperature source Oral, resp. rate 17, height 5\' 5"  (1.651 m), weight 55.3 kg, SpO2 90 %. Physical Exam  Constitutional: She is oriented to person, place, and time.  Elderly female, no acute distress, laying in bed  HENT:  Head:  Normocephalic and atraumatic.  Eyes: Pupils are equal, round, and reactive to light. Conjunctivae and EOM are normal.  Cardiovascular: Normal rate and intact distal pulses.  Irregularly irregular, no murmurs rubs or gallops, JVD up to mandible  Pulmonary/Chest:  Normal work of breathing, bibasilar crackles  Abdominal: Soft. Bowel sounds are normal. She exhibits no distension.  Mild abdominal edema  Musculoskeletal:        General: Edema (2+ BL LE edema up to thighs) present. Normal range of motion.     Cervical back: Normal range of motion and neck supple.  Neurological: She is alert and oriented to  person, place, and time.  Skin: Skin is warm and dry. She is not diaphoretic.  Psychiatric: Mood and affect normal.    EKG: personally reviewed my interpretation is heart rate in the 60s, irregularly irregular, right axis deviation  CXR: personally reviewed my interpretation is significant bilateral pleural effusions  Assessment & Plan by Problem: Principal Problem:   Acute respiratory failure with hypoxia (HCC) Active Problems:   Pleural effusion, bilateral  Acute on chronic heart failure: Acute respiratory failure 2/2 bilateral pleural effusions: This is a 85 year old female with a history of HFpEF (Echo in 03/28/19 EF 60-65%, DD, HTN, PVD, HLD, DM type 2, CKD stage IIIb, diet controlled DM, hypothyroidism, anemia, mesenteric ischemia, and breast carcinoma s/p mastectomy with a recent admission for acute on chronic heart failure requiring thoracentesis. Presenting with a 2-week history of worsening dyspnea, bilateral lower extremity swelling, and decreased appetite.  She reports compliance with the Lasix 40 mg twice daily, compliance with dietary and low sodium intake, denies any issues with urination.  She denies any chest pain, or signs or symptoms of infection.  Noted to be hypoxic requiring 2 L of oxygen.  On exam she was found to be significantly fluid overloaded, bibasilar crackles, 2+  bilateral leg edema up to thighs and in abdomen, and elevated JVD. Weight is up to 55 kg, from 51 kg in 7/20. Labs are significant for an AKI, creatinine 2.12 up from baseline of 1.66.  BNP of 2054. Troponin's elevated at 40 > 43. EKG showed new onset atrial fibrillation, HR 60s, right axis deviation, no ST changes.  Currently unclear as the cause of her bilateral pleural effusions, could be due to a heart failure exacerbation however she has developed a significant amount of fluid despite reported compliance with home Lasix.  During her last admission it does seem like she was noncompliant with medical and fluid restriction so this could be the cause. Patient also has an AKI on admission, will attempt IV diuresis with Lasix for now however may ultimately require another thoracentesis to drain.  -IV Lasix 40 mg once -Repeat echocardiogram -Cardiac telemetry -Strict I's and O's, daily weights -Consider thoracentesis in a.m. if insignificant output  New onset atrial fibrillation: Noted on EKG during admission, no prior history noted in chart. Patient is currently denying any chest pain or palpitations.  She is currently rate controlled, heart rate in the 60s.  It is unclear if her heart failure is contributing to the new onset atrial fibrillation or if the atrial fibrillation is causing the acute HF exacerbation. CHADs-Vasc score of 6, she is a candidate for anticoagulation. Since she potentially will get thoracentesis tomorrow will hold on starting anything at this time.  -Repeat EKG in AM -Consider warfarin following thoracentesis  Hypertension: Patient is on amlodipine 10 mg daily, hydralazine 25 mg 3 times daily at home. BP ranging around 146-159/40-50. Currently attempting IV diuresis.  -Continue home amlodipine 10 mg daily -Hold home hydralazine  Acute on chronic kidney disease: Baseline creatinine of around 1.6, creatinine on admission was 2.12.  Patient is currently significantly volume  overloaded, appears to be due to heart failure exacerbation.  Attempting IV diuresis at this time, will monitor carefully.  -Repeat BMP in AM  Hypothyroidism: Patient is on Synthroid 112 mcg daily.  TSH 3 weeks ago was 5.06, free T4 of 1.73.   -Continue synthroid 112 mcg daily  Normocytic anemia:  Patient has a history of anemia of chronic disease requiring occasional blood transfusions.  She is on iron and B12 at home. Hemoglobin on admission was 7.4, baseline around 7-8.  We will continue to monitor her with transfusions as needed. -Monitor CBC, transfuse if Hgb <7 -Continue home iron and B12  Hyperlipidemia:  -On Atorvastatin at home, continue this.   Dispo: Admit patient to Inpatient with expected length of stay greater than 2 midnights.  Signed: Asencion Noble, MD 09/12/2019, 9:56 PM  Pager: [phone]

## 2019-09-12 NOTE — ED Notes (Signed)
Meds from pharmacy delivered as NT was leaving to take pt upstairs.

## 2019-09-12 NOTE — ED Notes (Signed)
Two unsuccessful IV attempts. Placing request for IV consult.

## 2019-09-12 NOTE — ED Triage Notes (Addendum)
Pt reports "swelling all over" x2 weeks. States she takes a diuretic and hasnt missed any doses. Pt also endorses sob, denies cp. Wears 3L O2 at home, pt speaking in full sentences, nad. resp e/u.

## 2019-09-12 NOTE — ED Notes (Signed)
87% on 3L O2.
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
## 2019-09-12 NOTE — ED Notes (Signed)
Patient transported to X-ray 

## 2019-09-13 ENCOUNTER — Other Ambulatory Visit: Payer: Self-pay

## 2019-09-13 DIAGNOSIS — I5033 Acute on chronic diastolic (congestive) heart failure: Secondary | ICD-10-CM | POA: Diagnosis present

## 2019-09-13 DIAGNOSIS — J90 Pleural effusion, not elsewhere classified: Secondary | ICD-10-CM

## 2019-09-13 DIAGNOSIS — I4891 Unspecified atrial fibrillation: Secondary | ICD-10-CM | POA: Diagnosis present

## 2019-09-13 LAB — LACTATE DEHYDROGENASE, PLEURAL OR PERITONEAL FLUID: LD, Fluid: 69 U/L — ABNORMAL HIGH (ref 3–23)

## 2019-09-13 LAB — FERRITIN: Ferritin: 17 ng/mL (ref 11–307)

## 2019-09-13 LAB — BODY FLUID CELL COUNT WITH DIFFERENTIAL
Eos, Fluid: 1 %
Lymphs, Fluid: 36 %
Monocyte-Macrophage-Serous Fluid: 15 % — ABNORMAL LOW (ref 50–90)
Neutrophil Count, Fluid: 48 % — ABNORMAL HIGH (ref 0–25)
Total Nucleated Cell Count, Fluid: 61 cu mm (ref 0–1000)

## 2019-09-13 LAB — BASIC METABOLIC PANEL
Anion gap: 12 (ref 5–15)
BUN: 49 mg/dL — ABNORMAL HIGH (ref 8–23)
CO2: 21 mmol/L — ABNORMAL LOW (ref 22–32)
Calcium: 8.9 mg/dL (ref 8.9–10.3)
Chloride: 104 mmol/L (ref 98–111)
Creatinine, Ser: 2.19 mg/dL — ABNORMAL HIGH (ref 0.44–1.00)
GFR calc Af Amer: 23 mL/min — ABNORMAL LOW (ref >60)
GFR calc non Af Amer: 19 mL/min — ABNORMAL LOW (ref >60)
Glucose, Bld: 86 mg/dL (ref 70–99)
Potassium: 3.7 mmol/L (ref 3.5–5.1)
Sodium: 137 mmol/L (ref 135–145)

## 2019-09-13 LAB — URINALYSIS, ROUTINE W REFLEX MICROSCOPIC
Bilirubin Urine: NEGATIVE
Glucose, UA: NEGATIVE mg/dL
Hgb urine dipstick: NEGATIVE
Ketones, ur: NEGATIVE mg/dL
Leukocytes,Ua: NEGATIVE
Nitrite: NEGATIVE
Protein, ur: NEGATIVE mg/dL
Specific Gravity, Urine: 1.006 (ref 1.005–1.030)
pH: 5 (ref 5.0–8.0)

## 2019-09-13 LAB — LACTATE DEHYDROGENASE: LDH: 245 U/L — ABNORMAL HIGH (ref 98–192)

## 2019-09-13 LAB — CBC
HCT: 21.9 % — ABNORMAL LOW (ref 36.0–46.0)
Hemoglobin: 6.8 g/dL — CL (ref 12.0–15.0)
MCH: 25.7 pg — ABNORMAL LOW (ref 26.0–34.0)
MCHC: 31.1 g/dL (ref 30.0–36.0)
MCV: 82.6 fL (ref 80.0–100.0)
Platelets: 251 10*3/uL (ref 150–400)
RBC: 2.65 MIL/uL — ABNORMAL LOW (ref 3.87–5.11)
RDW: 16.2 % — ABNORMAL HIGH (ref 11.5–15.5)
WBC: 5.2 10*3/uL (ref 4.0–10.5)
nRBC: 0 % (ref 0.0–0.2)

## 2019-09-13 LAB — IRON AND TIBC
Iron: 10 ug/dL — ABNORMAL LOW (ref 28–170)
Saturation Ratios: 3 % — ABNORMAL LOW (ref 10.4–31.8)
TIBC: 361 ug/dL (ref 250–450)
UIBC: 351 ug/dL

## 2019-09-13 LAB — PROTEIN, PLEURAL OR PERITONEAL FLUID: Total protein, fluid: <3 g/dL

## 2019-09-13 LAB — PROTEIN, TOTAL: Total Protein: 6.5 g/dL (ref 6.5–8.1)

## 2019-09-13 LAB — HEMOGLOBIN AND HEMATOCRIT, BLOOD
HCT: 28.2 % — ABNORMAL LOW (ref 36.0–46.0)
Hemoglobin: 9.2 g/dL — ABNORMAL LOW (ref 12.0–15.0)

## 2019-09-13 LAB — GLUCOSE, CAPILLARY: Glucose-Capillary: 116 mg/dL — ABNORMAL HIGH (ref 70–99)

## 2019-09-13 LAB — PREPARE RBC (CROSSMATCH)

## 2019-09-13 LAB — SARS CORONAVIRUS 2 (TAT 6-24 HRS): SARS Coronavirus 2: NEGATIVE

## 2019-09-13 MED ORDER — FUROSEMIDE 10 MG/ML IJ SOLN
80.0000 mg | Freq: Two times a day (BID) | INTRAMUSCULAR | Status: DC
  Administered 2019-09-13 – 2019-09-14 (×2): 80 mg via INTRAVENOUS
  Filled 2019-09-13 (×2): qty 8

## 2019-09-13 MED ORDER — FUROSEMIDE 10 MG/ML IJ SOLN
80.0000 mg | Freq: Once | INTRAMUSCULAR | Status: AC
  Administered 2019-09-13: 80 mg via INTRAVENOUS
  Filled 2019-09-13: qty 8

## 2019-09-13 MED ORDER — SODIUM CHLORIDE 0.9% IV SOLUTION: Freq: Once | INTRAVENOUS | Status: DC

## 2019-09-13 MED ORDER — SODIUM CHLORIDE 0.9 % IV SOLN
510.0000 mg | Freq: Once | INTRAVENOUS | Status: AC
  Administered 2019-09-13: 510 mg via INTRAVENOUS
  Filled 2019-09-13: qty 17

## 2019-09-13 NOTE — Progress Notes (Signed)
PT Cancellation Note  Patient Details Name: Gina [name] MRN: [id number] DOB: 12/06/1928   Cancelled Treatment:    Reason Eval/Treat Not Completed: Patient not medically ready. Per RN pt with low HGB and she was about to start blood. RN also reports that patient may go for thoracentesis today as well. PT to return as able, as appropriate to complete PT eval.  Kittie Plater, PT, DPT Acute Rehabilitation Services Pager #: [phone] Office #: [phone]    Berline Lopes 09/13/2019, 8:55 AM

## 2019-09-13 NOTE — Progress Notes (Signed)
Occupational Therapy Evaluation Patient Details Name: Gina [name] MRN: [id number] DOB: [birth year] Today's Date: 09/13/2019    History of Present Illness 85 year old female with a history of HFpEF, DM, HTN, PVD, HLD, CKD IIIb, anemia, and breast carcinoma s/p mastectomy who presents with shortness of breath and edema, orthopnea, paroxysmal nocturnal dyspnea, and decreased appetite. Thoracentisis   Clinical Impression   PTA, pt lived in a Senior Living apt and had an aid 8 hrs/day. Pt states she has a son/grand daughter who can assist after DC. Pt able to mobilize to [facility], then recliner with minguard A on 4L with SpO2 maintaining above 90. Pt overall min A for ADL.Feel pt appropriate for DC home with [place], however recommend 24/7 S initially. Need to confirm that family is able to assist at DC. Will follow acutely.     Follow Up Recommendations  Home health OT;Supervision/Assistance - 24 hour(initially)    Equipment Recommendations  None recommended by OT    Recommendations for Other Services       Precautions / Restrictions Precautions Precautions: Fall      Mobility Bed Mobility Overal bed mobility: Needs Assistance Bed Mobility: Supine to Sit     Supine to sit: Supervision        Transfers Overall transfer level: Needs assistance   Transfers: Sit to/from Stand;Stand Pivot Transfers Sit to Stand: Min guard Stand pivot transfers: Min guard       General transfer comment: unsteady; pt has not been OOB    Balance Overall balance assessment: Needs assistance   Sitting balance-Leahy Scale: Good       Standing balance-Leahy Scale: Poor Standing balance comment: reliant on external support                           ADL either performed or assessed with clinical judgement   ADL Overall ADL's : Needs assistance/impaired     Grooming: Set up;Sitting   Upper Body Bathing: Set up;Sitting   Lower Body Bathing: [place] assistance;Sit to/from  stand   Upper Body Dressing : Set up;Sitting   Lower Body Dressing: Minimum assistance;Sit to/from stand   Toilet Transfer: Min guard;BSC;Stand-pivot   Toileting- Water quality scientist and Hygiene: Set up;Sit to/from stand       Functional mobility during ADLs: Min guard(furntiure walking)       Vision         Perception     Praxis      Pertinent Vitals/Pain Pain Assessment: Faces Faces Pain Scale: Hurts a little bit Pain Location: chest Pain Descriptors / Indicators: Discomfort Pain Intervention(s): Limited activity within patient's tolerance     Hand Dominance Right   Extremity/Trunk Assessment Upper Extremity Assessment Upper Extremity Assessment: Generalized weakness   Lower Extremity Assessment Lower Extremity Assessment: Defer to PT evaluation   Cervical / Trunk Assessment Cervical / Trunk Assessment: Kyphotic   Communication     Cognition Arousal/Alertness: Awake/alert Behavior During Therapy: WFL for tasks assessed/performed Overall Cognitive Status: No family/caregiver present to determine baseline cognitive functioning                                 General Comments: most likely baseline; will further assess   General Comments       Exercises     Shoulder Instructions      Home Living Family/patient expects to be discharged to:: Private residence Living Arrangements: Alone  Available Help at Discharge: Family;Personal care attendant;Available PRN/intermittently Type of Home: Apartment Home Access: Level entry     Home Layout: One level     Bathroom Shower/Tub: Teacher, early years/pre: Standard Bathroom Accessibility: Yes How Accessible: Accessible via walker Home Equipment: Echo - 2 wheels;Cane - single point;Grab bars - tub/shower;Shower seat   Additional Comments: States she has a call bell in the apt; life alert system      Prior Functioning/Environment Level of Independence: Needs assistance   Gait / Transfers Assistance Needed: Reports use of RW at home ADL's / Homemaking Assistance Needed: reports aide assist with bathing and IADLs as needed, indepednent toileting and dressing  Communication / Swallowing Assistance Needed: HOH Comments: retired Quarry manager        OT Problem List: Decreased strength;Decreased activity tolerance;Impaired balance (sitting and/or standing);Decreased safety awareness;Decreased knowledge of use of DME or AE;Cardiopulmonary status limiting activity      OT Treatment/Interventions: Self-care/ADL training;Therapeutic exercise;Energy conservation;DME and/or AE instruction;Therapeutic activities;Cognitive remediation/compensation;Patient/family education;Balance training    OT Goals(Current goals can be found in the care plan section) Acute Rehab OT Goals Patient Stated Goal: to go home OT Goal Formulation: With patient Time For Goal Achievement: 09/27/19 Potential to Achieve Goals: Good  OT Frequency: Min 3X/week   Barriers to D/C:            Co-evaluation              AM-PAC OT "6 Clicks" Daily Activity     Outcome Measure Help from another person eating meals?: None Help from another person taking care of personal grooming?: A Little Help from another person toileting, which includes using toliet, bedpan, or urinal?: A Little Help from another person bathing (including washing, rinsing, drying)?: A Lot Help from another person to put on and taking off regular upper body clothing?: A Little Help from another person to put on and taking off regular lower body clothing?: A Lot 6 Click Score: 17   End of Session Equipment Utilized During Treatment: Oxygen(5L) Nurse Communication: Mobility status  Activity Tolerance: Patient tolerated treatment well Patient left: in chair;with call bell/phone within reach;with chair alarm set  OT Visit Diagnosis: Unsteadiness on feet (R26.81);Muscle weakness (generalized) (M62.81);Other symptoms and signs  involving cognitive function                Time: 1605-1650 OT Time Calculation (min): 45 min Charges:  OT General Charges $OT Visit: 1 Visit OT Evaluation $OT Eval Moderate Complexity: 1 Mod OT Treatments $Self Care/Home Management : 23-37 mins  Maurie Boettcher, OT/L   Acute OT Clinical Specialist Acute Rehabilitation Services Pager [phone] Office [phone]   [facility] 09/13/2019, 5:49 PM

## 2019-09-13 NOTE — Progress Notes (Signed)
Patient's O2 stat dropped to the 70s at 4l, she is stating now 94 at 6l O2. Patient did received 40 mg lasix @2242  and had not voided yet. Bladder scan shows 367 ml MD on call notified.

## 2019-09-13 NOTE — Progress Notes (Signed)
OT Cancellation Note  Patient Details Name: Gina [name] MRN: [id number] DOB: 03-20-1929   Cancelled Treatment:    Reason Eval/Treat Not Completed: Patient not medically ready. Low Hgb. Pt to receive blood. Nsg requesting to hold this am.   Myrene Buddy Zayna Toste, OT/L   Acute OT Clinical Specialist Acute Rehabilitation Services Pager [phone] Office [phone]  09/13/2019, 9:06 AM

## 2019-09-13 NOTE — Progress Notes (Addendum)
Subjective:  Pt reports she is feeling better today. She continues to endorse shortness of breath that has improved. Discussed plan for thoracentisis and pt reported that she would like to move forward with said procedure.  Objective:  Vital signs in last 24 hours: Vitals:   09/13/19 1123 09/13/19 1125 09/13/19 1127 09/13/19 1132  BP:    (!) 136/54  Pulse:      Resp: 13 18 15 18   Temp:      TempSrc:      SpO2: 93% 95% 97% 100%  Weight:      Height:       Weight change:   Intake/Output Summary (Last 24 hours) at 09/13/2019 1343 Last data filed at 09/13/2019 1233 Gross per 24 hour  Intake --  Output 500 ml  Net -500 ml   Physical Exam Constitutional:      Appearance: She is ill-appearing.  Neck:     Comments: JVD present to jaw, pt at 45 degrees. Cardiovascular:     Rate and Rhythm: Normal rate. Rhythm irregular.     Heart sounds: Normal heart sounds. No murmur. No friction rub. No gallop.   Pulmonary:     Comments: Increased work of breathing, some coughing during exam, Decreased breath sounds in lower lobes bilaterally. Dullness to percussion in bilateral lower lobes.  Musculoskeletal:     Comments: +1 pitting edema bilaterally to just below the knees  Neurological:     Mental Status: She is alert.    Labs: Hgb 6.8, Hct 21.7  Assessment/Plan:  Principal Problem:   Acute on chronic heart failure with preserved ejection fraction (HFpEF) (HCC) Active Problems:   Essential hypertension   Iron deficiency anemia   Pleural effusion, bilateral   Acute respiratory failure with hypoxia (HCC)   Atrial fibrillation (HCC)  Pt is an 85 year old female with a history of HFpEF, DM, HTN, PVD, HLD, CKD IIIb, anemia, and breast carcinoma s/p mastectomy who presents with shortness of breath and edema, orthopnea, paroxysmal nocturnal dyspnea, and decreased appetite. Pt has an O2 saturation in the low 90's but remais clinically stable.   Acute on chronic heart failure with  preserved ejection fraction: Acute respiratory failure with hypoxia Bilateral pleural effusions: Pt has volume overload contributing to bilateral pleural effusions and peripheral edema. Pt continues to have below normal O2 saturation on oxygen. Elevated BNP, elevated creatinine, and EKG result of atrial fibrillation suggest a cardiorenal syndrome that has contributed to this CHF exacerbation, which possibly has led to these pleural effusions. Given pt's history of breast carcinoma and tuberculosis, pt should undergo thoracentesis to rule out these serious potential causes. - thoracentesis this morning for therapeutic and diagnostics on pleural effusion - continue IV Lasix 80mg  bid - Wean supplemental oxygen as able - Cardiac telemetry - Daily weights  Atrial fibrillation: EKG showed atrial fibrillation on admission. This morning on telemetry she is back in normal sinus rhythm. Patient at high risk for stroke, but also high risk for bleeding. 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  Potentially she could be eligible for anticoagulation in the future, but would need repeat endoscopy before to treat any actively bleeding telangiectasias. - Aspirin 81mg  daily for now  Hypertension: Pt's pressures remain mildly elevate in the 734'L-937'T systolic. Diastolic pressures are in the 40's-50's - Continue amlodipine 10 mg daily - holding home hydralazine 25 mg 3 times daily  Acute on  chronic kidney disease: Cr: 2.21. Pt volume overloaded this morning - Already given IV lasix this morning - Repeat BMP tomorrow AM  Normocytic anemia due to iron deficiency: Hgb 6.8. Pt was typed and screened. Given 1L of blood this morning via transfusion. Pt takes iron and B12 supplements. Anemia most likely due to iron deficiency from GI bleed due to telangiectasias - Repeat CBC this afternoon - s/p 1 transfusion  and 510mg  feraheme today.    LOS: 1 day   Carin Primrose, Medical Student 09/13/2019, 1:43 PM

## 2019-09-13 NOTE — Procedures (Addendum)
Thoracentesis Procedure Note  Pre-operative Diagnosis:  Pleural effusion  Indications:  Symptomatic pleural effusion  Procedure Details:  Informed consent was obtained after explanation of the risks and benefits of the procedure, refer to the consent documentation.  Time-out Performed immediately prior to the procedure.  All available chest radiographs were reviewed and the patient was subsequently placed in a sitting/semi-recumbent position.  Using ultrasound guidance a large pleural effusion was noted on the right.  The area was prepped with chlorhexadine and draped in sterile fashion.  Following this 1% lidocaine was injected subcutaneously and deep to provide anesthesia.  A small incision was then made parallel and superior to the rib, the thoracentesis needle with catheter was inserted into the chest wall and advanced under constant aspiration.  Upon aspiration of pleural fluid, the catheter was advanced into the pleural space and the needle was removed.  The catheter was then connected to a drainange bag and the fluid was removed under manual drainage. The catheter was then removed during slow forced exhalation and a sterile bandage was placed.  Findings:   1400 mL of straw-colored pleural fluid was removed.  Fluid was sent for analysis - LDH, protein, cytology, cell count, culture, AFB smear/culture  Condition: The patient tolerated the procedure well and remains in the same condition as pre-procedure.  Complications: None  Plan: A routine post-procedure xray is not necessary unless the patient develops new symptoms.

## 2019-09-14 DIAGNOSIS — E785 Hyperlipidemia, unspecified: Secondary | ICD-10-CM

## 2019-09-14 DIAGNOSIS — I130 Hypertensive heart and chronic kidney disease with heart failure and stage 1 through stage 4 chronic kidney disease, or unspecified chronic kidney disease: Principal | ICD-10-CM

## 2019-09-14 DIAGNOSIS — Z853 Personal history of malignant neoplasm of breast: Secondary | ICD-10-CM

## 2019-09-14 DIAGNOSIS — Z79899 Other long term (current) drug therapy: Secondary | ICD-10-CM

## 2019-09-14 DIAGNOSIS — E1151 Type 2 diabetes mellitus with diabetic peripheral angiopathy without gangrene: Secondary | ICD-10-CM

## 2019-09-14 DIAGNOSIS — N179 Acute kidney failure, unspecified: Secondary | ICD-10-CM

## 2019-09-14 DIAGNOSIS — E1122 Type 2 diabetes mellitus with diabetic chronic kidney disease: Secondary | ICD-10-CM

## 2019-09-14 DIAGNOSIS — N183 Chronic kidney disease, stage 3 unspecified: Secondary | ICD-10-CM

## 2019-09-14 DIAGNOSIS — I4891 Unspecified atrial fibrillation: Secondary | ICD-10-CM

## 2019-09-14 DIAGNOSIS — Z7982 Long term (current) use of aspirin: Secondary | ICD-10-CM

## 2019-09-14 DIAGNOSIS — D649 Anemia, unspecified: Secondary | ICD-10-CM

## 2019-09-14 DIAGNOSIS — D500 Iron deficiency anemia secondary to blood loss (chronic): Secondary | ICD-10-CM

## 2019-09-14 DIAGNOSIS — J9601 Acute respiratory failure with hypoxia: Secondary | ICD-10-CM

## 2019-09-14 DIAGNOSIS — Z9010 Acquired absence of unspecified breast and nipple: Secondary | ICD-10-CM

## 2019-09-14 LAB — CBC
HCT: 28 % — ABNORMAL LOW (ref 36.0–46.0)
Hemoglobin: 9.1 g/dL — ABNORMAL LOW (ref 12.0–15.0)
MCH: 26.8 pg (ref 26.0–34.0)
MCHC: 32.5 g/dL (ref 30.0–36.0)
MCV: 82.6 fL (ref 80.0–100.0)
Platelets: 221 10*3/uL (ref 150–400)
RBC: 3.39 MIL/uL — ABNORMAL LOW (ref 3.87–5.11)
RDW: 15.4 % (ref 11.5–15.5)
WBC: 5.8 10*3/uL (ref 4.0–10.5)
nRBC: 0 % (ref 0.0–0.2)

## 2019-09-14 LAB — TYPE AND SCREEN
ABO/RH(D): O POS
Antibody Screen: NEGATIVE
Unit division: 0

## 2019-09-14 LAB — COMPREHENSIVE METABOLIC PANEL
ALT: 15 U/L (ref 0–44)
AST: 26 U/L (ref 15–41)
Albumin: 3 g/dL — ABNORMAL LOW (ref 3.5–5.0)
Alkaline Phosphatase: 50 U/L (ref 38–126)
Anion gap: 11 (ref 5–15)
BUN: 49 mg/dL — ABNORMAL HIGH (ref 8–23)
CO2: 23 mmol/L (ref 22–32)
Calcium: 8.8 mg/dL — ABNORMAL LOW (ref 8.9–10.3)
Chloride: 105 mmol/L (ref 98–111)
Creatinine, Ser: 2.33 mg/dL — ABNORMAL HIGH (ref 0.44–1.00)
GFR calc Af Amer: 21 mL/min — ABNORMAL LOW (ref >60)
GFR calc non Af Amer: 18 mL/min — ABNORMAL LOW (ref >60)
Glucose, Bld: 101 mg/dL — ABNORMAL HIGH (ref 70–99)
Potassium: 3.9 mmol/L (ref 3.5–5.1)
Sodium: 139 mmol/L (ref 135–145)
Total Bilirubin: 1.1 mg/dL (ref 0.3–1.2)
Total Protein: 6.1 g/dL — ABNORMAL LOW (ref 6.5–8.1)

## 2019-09-14 LAB — GLUCOSE, CAPILLARY
Glucose-Capillary: 91 mg/dL (ref 70–99)
Glucose-Capillary: 121 mg/dL — ABNORMAL HIGH (ref 70–99)
Glucose-Capillary: 156 mg/dL — ABNORMAL HIGH (ref 70–99)
Glucose-Capillary: 118 mg/dL — ABNORMAL HIGH (ref 70–99)

## 2019-09-14 LAB — BPAM RBC
Blood Product Expiration Date: 202012212359
ISSUE DATE / TIME: 202012141012
Unit Type and Rh: 5100

## 2019-09-14 LAB — ACID FAST SMEAR (AFB, MYCOBACTERIA): Acid Fast Smear: NEGATIVE

## 2019-09-14 LAB — CYTOLOGY - NON PAP

## 2019-09-14 MED ORDER — FUROSEMIDE 10 MG/ML IJ SOLN
100.0000 mg | Freq: Two times a day (BID) | INTRAVENOUS | Status: DC
  Administered 2019-09-14: 100 mg via INTRAVENOUS
  Filled 2019-09-14 (×3): qty 10

## 2019-09-14 NOTE — Progress Notes (Addendum)
Subjective:  No acute events overnight. Pt is just waking up as the encounter starts. Pt reports feeling better and improved breathing. Pt denies any cough, dyspnea, chest pain, palpitations or dizziness. Pt reports ambulating to the bathroom yesterday as well.   Objective:  Vital signs in last 24 hours: Vitals:   09/13/19 1925 09/14/19 0500 09/14/19 0518 09/14/19 0852  BP: (!) 137/42  (!) 144/47 128/90  Pulse: 63  68   Resp: 16  16   Temp: 98.1 F (36.7 C)  98.1 F (36.7 C)   TempSrc: Oral  Oral   SpO2: 97%  91%   Weight:  51.9 kg    Height:       Weight change: -1.134 kg  Intake/Output Summary (Last 24 hours) at 09/14/2019 1101 Last data filed at 09/13/2019 2115 Gross per 24 hour  Intake 555 ml  Output 400 ml  Net 155 ml   Physical Exam Constitutional:      General: She is not in acute distress.    Comments: Pt is tired, just waking up from sleep  Cardiovascular:     Rate and Rhythm: Normal rate and regular rhythm.     Heart sounds: Normal heart sounds. No murmur. No friction rub. No gallop.   Pulmonary:     Effort: Pulmonary effort is normal.  Musculoskeletal:     Comments: + 2 pitting edema bilaterally up to just below the patella  Skin:    General: Skin is warm and dry.  Neurological:     General: No focal deficit present.     Mental Status: Mental status is at baseline.    Labs: Hgb 9.1, Hct 28.0 Ca 8.8, Albumin 3.0, Cr 2.33, BUN 49, LDH 245, protein 6.1  Pleural fluid: LD: 69, protein: <3.0,  Mesothelial cells present  Assessment/Plan:  Principal Problem:   Acute on chronic heart failure with preserved ejection fraction (HFpEF) (HCC) Active Problems:   Essential hypertension   Iron deficiency anemia   Pleural effusion, bilateral   Acute respiratory failure with hypoxia (HCC)   Atrial fibrillation (HCC)  Pt is an 85 year old female with a history of HFpEF, DM, HTN, PVD, HLD, CKD IIIb, anemia, and prior breast carcinoma s/p mastectomy  admitted with acute on chronic HFpEF.  Acute on chronic heart failure with preserved ejection fraction: Acute respiratory failure with hypoxia Bilateral pleural effusions: Thoracentesis performed in R pleural space yesterday. 1.4L of fluid removed. Effusion is transudative consistent with CHF. Pt respiratory status has improved. Exam still looks volume overloaded. She is requiring 5LPM of supplemental oxygen.  - May benefit from a left sided therapeutic thoracentesis - Increase dose of IV Lasix to 100 mg bid - Wean supplemental oxygen as able - Cardiac telemetry - Daily weights  Atrial fibrillation: Pt in normal sinus rhythm today. Patient at high risk for stroke, but also high risk for bleeding because of chronic iron deficiency anemia 2/2 to intestinal telangiectasias. Based bleeding risk factors, not a good candidate for anticoagulation - Continue Aspirin 81mg  daily  Normocytic anemia due to iron deficiency: Hgb 9.1 after receving 1L transfusion yesterday. Anemia is improving, most likely due to iron deficiency from GI bleed due to telangiectasias - Continue iron and B12 supplementation - Repeat CBC in AM  Acute on chronic kidney disease: Creatinine stable today, GFR is low around 15.  - Increase dose of IV Lasix to 100 mg bid - Repeat BMP tomorrow AM  Hypertension: - Continue amlodipine 10 mg daily - holding  home hydralazine 25 mg 3 times daily   LOS: 2 days   Gina [name], Medical Student 09/14/2019, 11:01 AM

## 2019-09-14 NOTE — Evaluation (Signed)
Physical Therapy Evaluation Patient Details Name: Gina [name] MRN: [id number] DOB: 04-28-1929 Today's Date: 09/14/2019   History of Present Illness  85 year old female with a history of HFpEF, DM, HTN, PVD, HLD, CKD IIIb, anemia, and breast carcinoma s/p mastectomy who presents with shortness of breath and edema, orthopnea, paroxysmal nocturnal dyspnea, and decreased appetite. Thoracentisis  Clinical Impression  Pt admitted with above. Pt with generalized weakness and decreased endurance. Pt reports needs O2 only at night now pt on 6Lo2 via [place] sating >90% during ambulation. I feel pt will be safe to return to her senior living apartment with 24/7 assist initially until pt is stronger and more steady as pt is at an increased falls risk. Acute PT to cont to follow.    Follow Up Recommendations Home health PT;Supervision/Assistance - 24 hour    Equipment Recommendations  Rolling walker with 5" wheels    Recommendations for Other Services       Precautions / Restrictions Precautions Precautions: Fall Restrictions Weight Bearing Restrictions: No      Mobility  Bed Mobility Overal bed mobility: Needs Assistance Bed Mobility: Supine to Sit     Supine to sit: Supervision     General bed mobility comments: increased time, use of bed rail, no physical assist  Transfers Overall transfer level: Needs assistance Equipment used: None Transfers: Sit to/from Stand;Stand Pivot Transfers Sit to Stand: Min guard Stand pivot transfers: Min guard       General transfer comment: increased time, guarded, min guard for safety and hand placement  Ambulation/Gait Ambulation/Gait assistance: Min assist Gait Distance (Feet): 175 Feet Assistive device: Rolling walker (2 wheeled) Gait Pattern/deviations: Step-through pattern;Decreased stride length;Narrow base of support Gait velocity: dec Gait velocity interpretation: 1.31 - 2.62 ft/sec, indicative of limited community  ambulator General Gait Details: pt near scissor like gait pattern, verbal cues to increase base of support however pt with continued narrow base of support, no standing rest breaks, good walker movement  Stairs            Wheelchair Mobility    Modified Rankin (Stroke Patients Only)       Balance Overall balance assessment: Needs assistance Sitting-balance support: No upper extremity supported;Feet supported Sitting balance-Leahy Scale: Good     Standing balance support: Bilateral upper extremity supported Standing balance-Leahy Scale: Poor Standing balance comment: reliant on external support                             Pertinent Vitals/Pain Pain Assessment: Faces Faces Pain Scale: Hurts a little bit Pain Location: L LE with ambulation Pain Descriptors / Indicators: Discomfort Pain Intervention(s): Monitored during session    Home Living Family/patient expects to be discharged to:: Private residence Living Arrangements: Alone Available Help at Discharge: Family;Personal care attendant;Available PRN/intermittently Type of Home: Apartment Home Access: Level entry     Home Layout: One level Home Equipment: Walker - 2 wheels;Cane - single point;Grab bars - tub/shower;Shower seat Additional Comments: States she has a call bell in the apt; life alert system    Prior Function Level of Independence: Needs assistance   Gait / Transfers Assistance Needed: Reports use of RW at home  ADL's / Homemaking Assistance Needed: reports aide assist with bathing and IADLs as needed, indepednent toileting and dressing   Comments: retired Production manager Domin[employer]   Dominant Hand: Right    Extremity/Trunk Assessment   Upper Extremity Assessment Upper Extremity  Assessment: Generalized weakness    Lower Extremity Assessment Lower Extremity Assessment: Generalized weakness    Cervical / Trunk Assessment Cervical / Trunk Assessment: Kyphotic  Communication    Communication: No difficulties  Cognition Arousal/Alertness: Awake/alert Behavior During Therapy: WFL for tasks assessed/performed Overall Cognitive Status: Within Functional Limits for tasks assessed                                 General Comments: pt soft spoken, clarified report with son, pt at baseline level of cognition      General Comments General comments (skin integrity, edema, etc.): VSS, pt assisted to [facility], pt supervision for tolieting and hygiene.    Exercises     Assessment/Plan    PT Assessment Patient needs continued PT services  PT Problem List Decreased strength;Decreased range of motion;Decreased activity tolerance;Decreased balance;Decreased mobility;Decreased coordination;Decreased cognition;Decreased knowledge of use of DME;Decreased safety awareness       PT Treatment Interventions DME instruction;Gait training;Stair training;Functional mobility training;Therapeutic activities;Therapeutic exercise;Balance training;Neuromuscular re-education    PT Goals (Current goals can be found in the Care Plan section)  Acute Rehab PT Goals Patient Stated Goal: to go home PT Goal Formulation: With patient Time For Goal Achievement: 09/28/19 Potential to Achieve Goals: Good    Frequency Min 3X/week   Barriers to discharge        Co-evaluation               AM-PAC PT "6 Clicks" Mobility  Outcome Measure Help needed turning from your back to your side while in a flat bed without using bedrails?: None Help needed moving from lying on your back to sitting on the side of a flat bed without using bedrails?: None Help needed moving to and from a bed to a chair (including a wheelchair)?: A Little Help needed standing up from a chair using your arms (e.g., wheelchair or bedside chair)?: A Little Help needed to walk in hospital room?: A Little Help needed climbing 3-5 steps with a railing? : A Lot 6 Click Score: 19    End of Session Equipment  Utilized During Treatment: Gait belt Activity Tolerance: Patient tolerated treatment well Patient left: in chair;with call bell/phone within reach;with chair alarm set Nurse Communication: Mobility status PT Visit Diagnosis: Unsteadiness on feet (R26.81);Muscle weakness (generalized) (M62.81);Difficulty in walking, not elsewhere classified (R26.2)    Time: 1540-0867 PT Time Calculation (min) (ACUTE ONLY): 30 min   Charges:   PT Evaluation $PT Eval Moderate Complexity: 1 Mod PT Treatments $Gait Training: 8-22 mins        Kittie Plater, PT, DPT Acute Rehabilitation Services Pager #: [phone] Office #: [phone]   Berline Lopes 09/14/2019, 11:10 AM

## 2019-09-15 DIAGNOSIS — N1832 Chronic kidney disease, stage 3b: Secondary | ICD-10-CM

## 2019-09-15 DIAGNOSIS — I739 Peripheral vascular disease, unspecified: Secondary | ICD-10-CM

## 2019-09-15 DIAGNOSIS — D509 Iron deficiency anemia, unspecified: Secondary | ICD-10-CM

## 2019-09-15 LAB — BASIC METABOLIC PANEL
Anion gap: 10 (ref 5–15)
BUN: 48 mg/dL — ABNORMAL HIGH (ref 8–23)
CO2: 25 mmol/L (ref 22–32)
Calcium: 8.8 mg/dL — ABNORMAL LOW (ref 8.9–10.3)
Chloride: 103 mmol/L (ref 98–111)
Creatinine, Ser: 2.37 mg/dL — ABNORMAL HIGH (ref 0.44–1.00)
GFR calc Af Amer: 21 mL/min — ABNORMAL LOW (ref >60)
GFR calc non Af Amer: 18 mL/min — ABNORMAL LOW (ref >60)
Glucose, Bld: 99 mg/dL (ref 70–99)
Potassium: 3.8 mmol/L (ref 3.5–5.1)
Sodium: 138 mmol/L (ref 135–145)

## 2019-09-15 LAB — BODY FLUID CELL COUNT WITH DIFFERENTIAL
Eos, Fluid: 0 %
Lymphs, Fluid: 20 %
Monocyte-Macrophage-Serous Fluid: 40 % — ABNORMAL LOW (ref 50–90)
Neutrophil Count, Fluid: 40 % — ABNORMAL HIGH (ref 0–25)
Total Nucleated Cell Count, Fluid: 233 cu mm (ref 0–1000)

## 2019-09-15 LAB — CBC
HCT: 27.4 % — ABNORMAL LOW (ref 36.0–46.0)
Hemoglobin: 8.9 g/dL — ABNORMAL LOW (ref 12.0–15.0)
MCH: 27.1 pg (ref 26.0–34.0)
MCHC: 32.5 g/dL (ref 30.0–36.0)
MCV: 83.3 fL (ref 80.0–100.0)
Platelets: 217 10*3/uL (ref 150–400)
RBC: 3.29 MIL/uL — ABNORMAL LOW (ref 3.87–5.11)
RDW: 15.9 % — ABNORMAL HIGH (ref 11.5–15.5)
WBC: 6.2 10*3/uL (ref 4.0–10.5)
nRBC: 0.6 % — ABNORMAL HIGH (ref 0.0–0.2)

## 2019-09-15 LAB — TSH: TSH: 17.192 u[IU]/mL — ABNORMAL HIGH (ref 0.350–4.500)

## 2019-09-15 LAB — PROTEIN, PLEURAL OR PERITONEAL FLUID: Total protein, fluid: <3 g/dL

## 2019-09-15 LAB — PROTEIN, TOTAL: Total Protein: 6.2 g/dL — ABNORMAL LOW (ref 6.5–8.1)

## 2019-09-15 LAB — LACTATE DEHYDROGENASE: LDH: 260 U/L — ABNORMAL HIGH (ref 98–192)

## 2019-09-15 LAB — LACTATE DEHYDROGENASE, PLEURAL OR PERITONEAL FLUID: LD, Fluid: 77 U/L — ABNORMAL HIGH (ref 3–23)

## 2019-09-15 MED ORDER — POTASSIUM CHLORIDE CRYS ER 20 MEQ PO TBCR
20.0000 meq | EXTENDED_RELEASE_TABLET | Freq: Once | ORAL | Status: AC
  Administered 2019-09-15: 07:00:00 20 meq via ORAL
  Filled 2019-09-15: qty 1

## 2019-09-15 MED ORDER — FUROSEMIDE 10 MG/ML IJ SOLN: 120.0000 mg | Freq: Two times a day (BID) | INTRAVENOUS | Status: DC

## 2019-09-15 MED ORDER — FUROSEMIDE 10 MG/ML IJ SOLN
120.0000 mg | Freq: Two times a day (BID) | INTRAVENOUS | Status: DC
  Administered 2019-09-15 – 2019-09-20 (×12): 120 mg via INTRAVENOUS
  Filled 2019-09-15: qty 12
  Filled 2019-09-15 (×2): qty 10
  Filled 2019-09-15: qty 12
  Filled 2019-09-15: qty 10
  Filled 2019-09-15: qty 2
  Filled 2019-09-15 (×2): qty 12
  Filled 2019-09-15 (×2): qty 10
  Filled 2019-09-15: qty 12
  Filled 2019-09-15: qty 10
  Filled 2019-09-15 (×2): qty 12

## 2019-09-15 NOTE — Progress Notes (Addendum)
Thoracentesis Procedure Note  Pre-operative Diagnosis:  Pleural effusion  Indications:  Symptomatic pleural effusion  Procedure Details:  Informed consent was obtained after explanation of the risks and benefits of the procedure, refer to the consent documentation.  Time-out Performed immediately prior to the procedure.  All available chest radiographs were reviewed and the patient was subsequently placed in a sitting/semi-recumbent position.  Using ultrasound guidance a large pleural effusion was noted on the left.  The area was prepped with chlorhexadine and draped in sterile fashion.  Following this 1% lidocaine was injected subcutaneously and deep to provide anesthesia.  A small incision was then made parallel and superior to the rib, the thoracentesis needle with catheter was inserted into the chest wall and advanced under constant aspiration.  Upon aspiration of pleural fluid, the catheter was advanced into the pleural space and the needle was removed.  The catheter was then connected to a drainange bag and the fluid was removed under manual drainage. The catheter was then removed during slow forced exhalation and a sterile bandage was placed.  Findings:   1000 mL of clear yellow pleural fluid was removed.  Fluid was sent for cytology, LDH, PR, cell count  Condition: The patient tolerated the procedure well and remains in the same condition as pre-procedure.  Complications: None  Plan: A routine post-procedure xray is not necessary unless the patient develops new symptoms.

## 2019-09-15 NOTE — Progress Notes (Addendum)
Subjective:  No acute events overnight. Pt reports feeling fine this morning. Pt endorses a cough that is occasional and "feels like she is coughing up congestion," but denies shortness of breath, chest pain, palpitations, headaches or dizziness.   Objective:  Vital signs in last 24 hours: Vitals:   09/14/19 0518 09/14/19 0852 09/14/19 2039 09/15/19 0623  BP: (!) 144/47 128/90 (!) 144/71 (!) 148/77  Pulse: 68  65 63  Resp: 16  20 17   Temp: 98.1 F (36.7 C)  98.1 F (36.7 C) 98 F (36.7 C)  TempSrc: Oral  Oral Oral  SpO2: 91%  94% 91%  Weight:    53.7 kg  Height:       Weight change: 1.763 kg  Intake/Output Summary (Last 24 hours) at 09/15/2019 0731 Last data filed at 09/15/2019 0500 Gross per 24 hour  Intake 290 ml  Output 500 ml  Net -210 ml   Physical Exam Constitutional:      General: She is not in acute distress.    Comments: Pt appears tired and sluggish  Cardiovascular:     Rate and Rhythm: Normal rate and regular rhythm.     Heart sounds: Normal heart sounds. No murmur. No friction rub. No gallop.   Pulmonary:     Effort: Pulmonary effort is normal.     Comments: Decreased breath sounds. Unsure if pt was taking deep breaths Musculoskeletal:     Comments: +1 pitting edema bilateral lower extremities to the mid-calf  Neurological:     General: No focal deficit present.     Mental Status: Mental status is at baseline.  Psychiatric:        Mood and Affect: Mood normal.     Labs: Cr. 2.37, BUN 48 Hgb 8.9, Hct 27.4  Assessment/Plan:  Principal Problem:   Acute on chronic heart failure with preserved ejection fraction (HFpEF) (HCC) Active Problems:   Essential hypertension   Iron deficiency anemia   Pleural effusion, bilateral   Acute respiratory failure with hypoxia (HCC)   Atrial fibrillation (HCC)  Pt is an 85 year old female with a history of HFpEF, HTN, PVD, HLD, CKD IIIb, anemia, and prior breast carcinoma s/p mastectomy admitted with acute  on chronic HFpEF  Acute on chronic heart failure with preserved ejection fraction: Acute respiratory failure with hypoxia Bilateral pleural effusions: Pt's volume status is improving based on peripheral edema resolving. Pt's respiratory status unchanged from yesterday. Pt is requiring 6L of O2. Edema most likely from HF exacerbation, less likely due to other causes like nephrotic syndrome based on lack of proteinuria or liver injury based on normal LFT's. Still has JVD on exam and requires further diuresis, will increase dose and track response - Left sided thoracentesis today - Increase to IV Lasix 120 mg BID - wean off supplemental O2 if possible - Cardiac telemetry - Continue daily weights  Acute on chronic kidney disease: Creatinine 2.37. Slightly increased but stable, GFR improving. Most likely result of chronic disease since Cr levels are mostly unchanged. - Increase dose of IV Lasix to 120 mg bid - Repeat BMP tomorrow AM  Normocytic anemia due to iron deficiency: Hgb 8.9, remains stable. - Continue iron and B12 supplementation - Repeat CBC in AM  Atrial fibrillation: Pt maintaining normal sinus rhythm today. - Continue Aspirin 81mg  daily - Avoiding anticoagulation given suspected GI bleeding  Hypertension: - Continue amlodipine 10 mg daily - holding home hydralazine   LOS: 3 days   Carin Primrose, Medical  Student 09/15/2019, 7:31 AM

## 2019-09-15 NOTE — Progress Notes (Signed)
OT Cancellation Note  Patient Details Name: Gina [name] MRN: [id number] DOB: 10/13/1928   Cancelled Treatment:    Reason Eval/Treat Not Completed: Other (comment);Patient declined, no reason specified Pt reports being cold and "not wanting to get out of this bed right now." Will check back as time allows for OT session.   Lanier Clam., COTA/L Acute Rehabilitation Services [phone] [place] 09/15/2019, 11:01 AM

## 2019-09-16 ENCOUNTER — Inpatient Hospital Stay (HOSPITAL_COMMUNITY): Payer: Medicare Other

## 2019-09-16 DIAGNOSIS — I340 Nonrheumatic mitral (valve) insufficiency: Secondary | ICD-10-CM

## 2019-09-16 DIAGNOSIS — E039 Hypothyroidism, unspecified: Secondary | ICD-10-CM

## 2019-09-16 DIAGNOSIS — I361 Nonrheumatic tricuspid (valve) insufficiency: Secondary | ICD-10-CM

## 2019-09-16 LAB — T4, FREE: Free T4: 1.38 ng/dL — ABNORMAL HIGH (ref 0.61–1.12)

## 2019-09-16 LAB — BASIC METABOLIC PANEL
Anion gap: 9 (ref 5–15)
BUN: 46 mg/dL — ABNORMAL HIGH (ref 8–23)
CO2: 25 mmol/L (ref 22–32)
Calcium: 8.2 mg/dL — ABNORMAL LOW (ref 8.9–10.3)
Chloride: 104 mmol/L (ref 98–111)
Creatinine, Ser: 2.41 mg/dL — ABNORMAL HIGH (ref 0.44–1.00)
GFR calc Af Amer: 20 mL/min — ABNORMAL LOW (ref >60)
GFR calc non Af Amer: 17 mL/min — ABNORMAL LOW (ref >60)
Glucose, Bld: 88 mg/dL (ref 70–99)
Potassium: 4 mmol/L (ref 3.5–5.1)
Sodium: 138 mmol/L (ref 135–145)

## 2019-09-16 LAB — HEPATIC FUNCTION PANEL
ALT: 16 U/L (ref 0–44)
AST: 64 U/L — ABNORMAL HIGH (ref 15–41)
Albumin: 2.7 g/dL — ABNORMAL LOW (ref 3.5–5.0)
Alkaline Phosphatase: 43 U/L (ref 38–126)
Bilirubin, Direct: <0.1 mg/dL (ref 0.0–0.2)
Total Bilirubin: 0.4 mg/dL (ref 0.3–1.2)
Total Protein: 5.4 g/dL — ABNORMAL LOW (ref 6.5–8.1)

## 2019-09-16 LAB — ECHOCARDIOGRAM COMPLETE
Height: 65 in
Weight: 1880.08 oz

## 2019-09-16 LAB — CBC
HCT: 26.7 % — ABNORMAL LOW (ref 36.0–46.0)
Hemoglobin: 8.3 g/dL — ABNORMAL LOW (ref 12.0–15.0)
MCH: 26.8 pg (ref 26.0–34.0)
MCHC: 31.1 g/dL (ref 30.0–36.0)
MCV: 86.1 fL (ref 80.0–100.0)
Platelets: 194 10*3/uL (ref 150–400)
RBC: 3.1 MIL/uL — ABNORMAL LOW (ref 3.87–5.11)
RDW: 16.2 % — ABNORMAL HIGH (ref 11.5–15.5)
WBC: 6.3 10*3/uL (ref 4.0–10.5)
nRBC: 0.6 % — ABNORMAL HIGH (ref 0.0–0.2)

## 2019-09-16 LAB — BODY FLUID CULTURE: Culture: NO GROWTH

## 2019-09-16 LAB — CYTOLOGY - NON PAP

## 2019-09-16 LAB — MAGNESIUM: Magnesium: 1.7 mg/dL (ref 1.7–2.4)

## 2019-09-16 MED ORDER — MAGNESIUM SULFATE 50 % IJ SOLN
3.0000 g | Freq: Once | INTRAVENOUS | Status: AC
  Administered 2019-09-16: 3 g via INTRAVENOUS
  Filled 2019-09-16: qty 6

## 2019-09-16 NOTE — Discharge Summary (Addendum)
Name: Gina [name] MRN: [id number] DOB: 09/09/1929 85 y.o. PCP: [name], NP  Date of Admission: 09/12/2019  2:21 PM Date of Discharge: 09/26/2019 Attending Physician: Dr. Philipp Ovens  Discharge Diagnosis: 1. Acute hypoxic respiratory failure 2. Acute on chronic HFpEF 3. Bilateral pleural effusions 4. Iron deficiency anemia  Discharge Medications: Allergies as of 09/26/2019   No Known Allergies     Medication List    STOP taking these medications   hydrALAZINE 25 MG tablet Commonly known as: APRESOLINE   predniSONE 20 MG tablet Commonly known as: DELTASONE     TAKE these medications   albuterol 108 (90 Base) MCG/ACT inhaler Commonly known as: VENTOLIN HFA Inhale 2 puffs into the lungs every 6 (six) hours as needed for wheezing or shortness of breath.   amLODipine 10 MG tablet Commonly known as: NORVASC Take 1 tablet (10 mg total) by mouth daily.   aspirin EC 81 MG tablet Take 81 mg by mouth daily.   atorvastatin 20 MG tablet Commonly known as: LIPITOR Take 1 tablet (20 mg total) by mouth daily at 6 PM.   bimatoprost 0.01 % Soln Commonly known as: LUMIGAN Place 1 drop into both eyes at bedtime.   calcium carbonate 1500 (600 Ca) MG Tabs tablet Commonly known as: OSCAL Take 1 tablet (1,500 mg total) by mouth daily.   ferrous sulfate 325 (65 FE) MG EC tablet Take 1 tablet (325 mg total) by mouth daily.   furosemide 80 MG tablet Commonly known as: LASIX Take 1 tablet (80 mg total) by mouth 2 (two) times daily. What changed:   medication strength  how much to take  when to take this  additional instructions   latanoprost 0.005 % ophthalmic solution Commonly known as: XALATAN Place 1 drop into both eyes at bedtime.   levothyroxine 112 MCG tablet Commonly known as: SYNTHROID Take 1 tablet (112 mcg total) by mouth daily.   linaclotide 145 MCG Caps capsule Commonly known as: Linzess Take 1 capsule (145 mcg total) by mouth daily  before breakfast.   mirtazapine 15 MG tablet Commonly known as: REMERON Take 0.5 tablets (7.5 mg total) by mouth at bedtime.   Rhopressa 0.02 % Soln Generic drug: Netarsudil Dimesylate Place 1 drop into both eyes at bedtime.   vitamin B-12 500 MCG tablet Commonly known as: CYANOCOBALAMIN Take 500 mcg by mouth daily.   vitamin E 400 UNIT capsule Take 400 Units by mouth daily.            Durable Medical Equipment  (From admission, onward)         Start     Ordered   09/26/19 1342  For home use only DME lightweight manual wheelchair with seat cushion  Once    Comments: Patient suffers from heart failure which impairs their ability to perform daily activities like bathing, dressing, grooming and toileting in the home.  A walker will not resolve issue with performing activities of daily living. A wheelchair will allow patient to safely perform daily activities. Patient can safely propel the wheelchair in the home or has a caregiver who can provide assistance. Length of need Lifetime.Accessories: elevating leg rests (ELRs), wheel locks, extensions, back cushion, and anti-tippers.   09/26/19 1342   09/26/19 1313  DME Walker  Once    Question Answer Comment  Walker: With 5 Inch Wheels   Patient needs a walker to treat with the following condition Acute on chronic heart failure ([place])      09/26/19  1317   09/26/19 1313  DME 3-in-1  Once     09/26/19 1317          Disposition and follow-up:   [name] was discharged from [facility] in [place] condition.  At the hospital follow up visit please address:  1.  Patient discharged on lasix 80 mg PO twice daily. Recommend BMP in 7-10 days to evaluate electrolytes/renal function. Please consider chest x-ray for evaluation of bilateral pleural effusions. We would also advise CBC to evaluate patient's anemia.   2.  Labs / imaging needed at time of follow-up: CBC, BMP, Chest X-Ray  3.  Pending labs/ test needing  follow-up: None  Follow-up Appointments: Follow-up Information    Almyra Deforest, [place]. Go on 10/05/2019.   Specialties: Cardiology, Radiology Why: @10am  for hospital follow up with Dr. Rosezella Florida PA -Please arrive 10 minutes early  Contact information: [place] [address] [phone]        [name], NP Follow up.   Specialty: Internal Medicine Why: Please call your primary care doctor for an appointment to occur within the next week Contact information: [place] [address] [phone]        Minus Breeding, MD .   Specialty: Cardiology Contact information: [address] [place] [address] [place] Follow up.   Specialty: Home Health Services Why: [phone].  The agency will contact you to arrange appointments for physical therapy.           Hospital Course by problem list: # Acute on chronic heart failure with preserved ejection fraction: # Bilateral pleural effusions: Patient presented with 2-week history of progressive worsening shortness of breath and generalized swelling.  Chest x-ray at admission showed large bilateral pleural effusions for which she had previously had bilateral thoracentesis in July on prior admission.  IV Lasix dosage was titrated to effect and patient obtained adequate diuresis at a dosage of 120 mg IV twice daily. Patient had three thoracenteses performed - right side drained twice (1400 mL first time on 12/14, 700 mL second time on 12/23) and left side drained once (1000 mL on 12/16). Analysis was conducted for bilateral effusions (12/14 and 12/16). Fluid was straw-colored and lights criteria was consistent with transitive effusion.  Cytology was without evidence for malignant cells.  Repeat echocardiogram showed EF 60-65%, no regional wall abnormalities,mild mitral regurgitation , and Grade 1 diastolic dysfunction. Cardiology  consulted and recommended discharging on lasix 80 mg PO twice daily. Patient weight of 50.1 kg on day of discharge, was 53 kg on admission, 51.2 kg at last discharge.  # Hypothyroidism: Patient TSH of 17.192 during this admission, was 5.060 on 08/19/2019.  T4 was elevated at 1.38.  Elevated TSH thought to be secondary to issues with medication compliance.  Elevated T4, which was obtained several days into hospitalization, is thought to be secondary to the transient rise which can be seen with initiation of Synthroid therapy.  # Acute on chronic kidney disease: Baseline creatinine of around 1.7 in July 2020, creatinine on admission of 2.12.  Elevated creatinine thought to be due to heart failure exacerbation. Creatinine improved with diuresis. Creatinine at discharge of 1.75  # Normocytic anemia: Hemoglobin of 7.4 on admission, last baseline of 8.3 on 08/19/2019. Decreased to 6.8 on 09/13/2019 and patient was transfused 1 unit with appropriate response. Hemoglobin was stable during admission and value of  7.9 on day of discharge.  Iron studies were consistent with iron deficiency anemia with low iron and ferritin.  Patient was continued on iron supplementation and received two IV doses of Feraheme.    Discharge Vitals:   BP (!) 150/45 (BP Location: Left Arm)   Pulse 66   Temp 98.2 F (36.8 C) (Oral)   Resp 19   Ht 5\' 5"  (1.651 m)   Wt 50.1 kg   SpO2 99%   BMI 18.39 kg/m   Pertinent Labs, Studies, and Procedures:   CBC Latest Ref Rng & Units 09/26/2019 09/25/2019 09/24/2019  WBC 4.0 - 10.5 K/uL 4.5 4.8 4.9  Hemoglobin 12.0 - 15.0 g/dL 7.9(L) 8.1(L) 8.2(L)  Hematocrit 36.0 - 46.0 % 25.3(L) 25.7(L) 26.5(L)  Platelets 150 - 400 K/uL 221 190 204   BMP Latest Ref Rng & Units 09/26/2019 09/25/2019 09/24/2019  Glucose 70 - 99 mg/dL 94 91 149(H)  BUN 8 - 23 mg/dL 53(H) 55(H) 58(H)  Creatinine 0.44 - 1.00 mg/dL 1.75(H) 1.82(H) 1.95(H)  BUN/Creat Ratio 12 - 28 - - -  Sodium 135 - 145 mmol/L 138  140 137  Potassium 3.5 - 5.1 mmol/L 4.5 4.3 5.5(H)  Chloride 98 - 111 mmol/L 95(L) 96(L) 95(L)  CO2 22 - 32 mmol/L 37(H) 35(H) 32  Calcium 8.9 - 10.3 mg/dL 8.2(L) 7.8(L) 8.0(L)     CXR (09/12/2019): IMPRESSION: Large bilateral pleural effusions with underlying atelectatic changes significantly increased from the prior exam.  CXR (09/22/2019): IMPRESSION: 1. Persistent large bilateral pleural effusions, slightly increased on the right. 2. Resolved interstitial pulmonary edema. 3.  Aortic Atherosclerosis (ICD10-I70.0)  CXR (09/25/2019): IMPRESSION: 1. Slight increase in the right pleural effusion. 2. Slight decrease in the left pleural effusion.  TEE (09/16/2019): IMPRESSIONS  1. Left ventricular ejection fraction, by visual estimation, is 60 to 65%. The left ventricle has normal function. There is moderately increased left ventricular hypertrophy.  2. Elevated left atrial and left ventricular end-diastolic pressures.  3. Left ventricular diastolic parameters are consistent with Grade I diastolic dysfunction (impaired relaxation).  4. The left ventricle has no regional wall motion abnormalities.  5. Global right ventricle has normal systolic function.The right ventricular size is normal. No increase in right ventricular wall thickness.  6. Left atrial size was moderately dilated.  7. Right atrial size was normal.  8. The mitral valve is abnormal. Mild mitral valve regurgitation.  9. The tricuspid valve is grossly normal. Tricuspid valve regurgitation is mild. 10. The aortic valve is tricuspid. Aortic valve regurgitation is not visualized. 11. The pulmonic valve was grossly normal. Pulmonic valve regurgitation is trivial. 12. The inferior vena cava is normal in size with greater than 50% respiratory variability, suggesting right atrial pressure of 3 mmHg. 13. Large pleural effusion in both left and right lateral regions. 14. Trivial pericardial effusion is  present.    Discharge Instructions: Discharge Instructions    Call MD for:  difficulty breathing, headache or visual disturbances   Complete by: As directed    Call MD for:  persistant dizziness or light-headedness   Complete by: As directed    Call MD for:  persistant nausea and vomiting   Complete by: As directed    Call MD for:  severe uncontrolled pain   Complete by: As directed    Diet - low sodium heart healthy   Complete by: As directed    Discharge instructions   Complete by: As directed    You were seen in the hospital for heart  failure.  We used medications to get fluid off of your body and we also directly drained fluid from her lungs.  We want you to take Lasix 80 mg by mouth twice daily and follow-up with your primary care provider within the next week.  You also have an appointment with cardiology on January 5th.  Is important for you to take your medications as prescribed and follow-up with your doctors to help manage your medications.  Thank you for allowing [place] to be part of your medical care!   Increase activity slowly   Complete by: As directed       Signed: Jeanmarie Hubert, MD 09/26/2019, 3:22 PM   Pager: [phone]

## 2019-09-16 NOTE — Progress Notes (Signed)
Physical Therapy Treatment Patient Details Name: Gina [name] MRN: [id number] DOB: 1928/12/05 Today's Date: 09/16/2019    History of Present Illness 85 year old female with a history of HFpEF, DM, HTN, PVD, HLD, CKD IIIb, anemia, and breast carcinoma s/p mastectomy who presents with shortness of breath and edema, orthopnea, paroxysmal nocturnal dyspnea, and decreased appetite. Thoracentisis 12/14 and 12/16.    PT Comments    Pt seen for mobility progression. Pt on BSC upon arrival and agreeable to participate in therapy. Pt requires supervision/min guard for functional transfers and short distance gait using RW. Pt ambulated to EOB and then declined further mobility due to stomach pain and fear of having another BM while ambulating. Increased mobility encouraged. Current plan remains appropriate.    Follow Up Recommendations  Home health PT;Supervision/Assistance - 24 hour     Equipment Recommendations  Rolling walker with 5" wheels    Recommendations for Other Services       Precautions / Restrictions Precautions Precautions: Fall Restrictions Weight Bearing Restrictions: No    Mobility  Bed Mobility               General bed mobility comments: pt on BSC upon arrival and sitting EOB end of session  Transfers Overall transfer level: Needs assistance Equipment used: Rolling walker (2 wheeled) Transfers: Sit to/from Stand Sit to Stand: Supervision;Min guard         General transfer comment: cues for hand placement; supervision for safety from [facility] and min guard when sitting EOB   Ambulation/Gait Ambulation/Gait assistance: Min guard Gait Distance (Feet): 6 Feet Assistive device: Rolling walker (2 wheeled) Gait Pattern/deviations: Step-through pattern;Decreased stride length;Trunk flexed     General Gait Details: pt agreeable to ambulate until after getting to EOB and then with c/o stomach pain and fear that she would have another BM while ambulating; min  guard for safety; pt with short bilat step lengths and flexed posture   Stairs             Wheelchair Mobility    Modified Rankin (Stroke Patients Only)       Balance Overall balance assessment: Needs assistance Sitting-balance support: No upper extremity supported;Feet supported Sitting balance-Leahy Scale: Good     Standing balance support: Bilateral upper extremity supported;During functional activity Standing balance-Leahy Scale: Poor                              Cognition Arousal/Alertness: Awake/alert Behavior During Therapy: WFL for tasks assessed/performed Overall Cognitive Status: Within Functional Limits for tasks assessed                                        Exercises      General Comments General comments (skin integrity, edema, etc.): VSS      Pertinent Vitals/Pain Pain Assessment: Faces Faces Pain Scale: Hurts a little bit Pain Location: stomach Pain Descriptors / Indicators: Discomfort Pain Intervention(s): Monitored during session    Home Living                      Prior Function            PT Goals (current goals can now be found in the care plan section) Progress towards PT goals: Progressing toward goals    Frequency    Min 3X/week  PT Plan Current plan remains appropriate    Co-evaluation              AM-PAC PT "6 Clicks" Mobility   Outcome Measure  Help needed turning from your back to your side while in a flat bed without using bedrails?: None Help needed moving from lying on your back to sitting on the side of a flat bed without using bedrails?: None Help needed moving to and from a bed to a chair (including a wheelchair)?: A Little Help needed standing up from a chair using your arms (e.g., wheelchair or bedside chair)?: A Little Help needed to walk in hospital room?: A Little Help needed climbing 3-5 steps with a railing? : A Lot 6 Click Score: 19    End of  Session   Activity Tolerance: Patient tolerated treatment well Patient left: in bed;with call bell/phone within reach;Other (comment);with bed alarm set(pt requesting to sit EOB) Nurse Communication: Mobility status PT Visit Diagnosis: Unsteadiness on feet (R26.81);Muscle weakness (generalized) (M62.81);Difficulty in walking, not elsewhere classified (R26.2)     Time: 2224-1146 PT Time Calculation (min) (ACUTE ONLY): 12 min  Charges:  $Gait Training: 8-22 mins                     Earney Navy, PTA Acute Rehabilitation Services Pager: [phone] Office: [phone]     Darliss Cheney 09/16/2019, 4:44 PM

## 2019-09-16 NOTE — Progress Notes (Signed)
Occupational Therapy Treatment Patient Details Name: Gina [name] MRN: [id number] DOB: 07/23/1929 Today's Date: 09/16/2019    History of present illness 85 year old female with a history of HFpEF, DM, HTN, PVD, HLD, CKD IIIb, anemia, and breast carcinoma s/p mastectomy who presents with shortness of breath and edema, orthopnea, paroxysmal nocturnal dyspnea, and decreased appetite. Thoracentisis 12/14 and 12/16.   OT comments  Patient supine in bed and agreeable to OT session. Completing transfers with min guard, in room mobility using RW with min guard and grooming standing at sink with min guard. On 2L supplemental O2 via [place] during session, VSS. Continues to be limited by decreased activity tolerance. Continue to recommend initial 24/7 support. Will follow.    Follow Up Recommendations  Home health OT;Supervision/Assistance - 24 hour(initally)    Equipment Recommendations  None recommended by OT    Recommendations for Other Services      Precautions / Restrictions Precautions Precautions: Fall Restrictions Weight Bearing Restrictions: No       Mobility Bed Mobility Overal bed mobility: Needs Assistance Bed Mobility: Supine to Sit;Sit to Supine     Supine to sit: Supervision Sit to supine: Supervision   General bed mobility comments: increased time, no physical support required   Transfers Overall transfer level: Needs assistance Equipment used: Rolling walker (2 wheeled) Transfers: Sit to/from Stand Sit to Stand: Min guard         General transfer comment: min guard, cueing for hand placement and safety     Balance Overall balance assessment: Needs assistance Sitting-balance support: No upper extremity supported;Feet supported Sitting balance-Leahy Scale: Good     Standing balance support: Bilateral upper extremity supported;During functional activity Standing balance-Leahy Scale: Poor Standing balance comment: reliant on external support                            ADL either performed or assessed with clinical judgement   ADL Overall ADL's : Needs assistance/impaired     Grooming: Min guard;Standing;Wash/dry face Grooming Details (indicate cue type and reason): standing at sink             Lower Body Dressing: Minimal assistance;Sit to/from stand Lower Body Dressing Details (indicate cue type and reason): reaching to adjust socks with min A seated, min guard sit to stand  Toilet Transfer: Min guard;Ambulation;RW Toilet Transfer Details (indicate cue type and reason): simulated in room         Functional mobility during ADLs: Min guard;Rolling walker       Vision       Perception     Praxis      Cognition Arousal/Alertness: Awake/alert Behavior During Therapy: WFL for tasks assessed/performed Overall Cognitive Status: Within Functional Limits for tasks assessed                                          Exercises     Shoulder Instructions       General Comments VSS    Pertinent Vitals/ Pain       Pain Assessment: Faces Faces Pain Scale: Hurts a little bit Pain Location: generalized Pain Descriptors / Indicators: Discomfort Pain Intervention(s): Monitored during session  Home Living  Prior Functioning/Environment              Frequency  Min 3X/week        Progress Toward Goals  OT Goals(current goals can now be found in the care plan section)  Progress towards OT goals: Progressing toward goals  Acute Rehab OT Goals Patient Stated Goal: to go home OT Goal Formulation: With patient Time For Goal Achievement: 09/27/19 Potential to Achieve Goals: Good  Plan Discharge plan remains appropriate;Frequency remains appropriate    Co-evaluation                 AM-PAC OT "6 Clicks" Daily Activity     Outcome Measure   Help from another person eating meals?: None Help from another person taking  care of personal grooming?: A Little Help from another person toileting, which includes using toliet, bedpan, or urinal?: A Little Help from another person bathing (including washing, rinsing, drying)?: A Little Help from another person to put on and taking off regular upper body clothing?: A Little Help from another person to put on and taking off regular lower body clothing?: A Little 6 Click Score: 19    End of Session Equipment Utilized During Treatment: Oxygen(2L)  OT Visit Diagnosis: Unsteadiness on feet (R26.81);Muscle weakness (generalized) (M62.81);Other symptoms and signs involving cognitive function   Activity Tolerance Patient tolerated treatment well   Patient Left with call bell/phone within reach;in bed;with bed alarm set   Nurse Communication Mobility status        Time: 0600-4599 OT Time Calculation (min): 22 min  Charges: OT General Charges $OT Visit: 1 Visit OT Treatments $Self Care/Home Management : 8-22 mins  [place] Pager [phone] Office [place] 09/16/2019, 1:28 PM

## 2019-09-16 NOTE — Progress Notes (Signed)
  Echocardiogram 2D Echocardiogram has been attempted. Patient eating lunch. Stated to come back later.  Gina [name] Gina [name] 09/16/2019, 1:34 PM

## 2019-09-16 NOTE — Care Management Important Message (Signed)
Important Message  Patient Details  Name: Gina [name] MRN: [id number] Date of Birth: January 10, 1929   Medicare Important Message Given:  Yes     [name] 09/16/2019, 12:59 PM

## 2019-09-16 NOTE — Progress Notes (Signed)
  Echocardiogram 2D Echocardiogram has been performed.  Gina [name] G [name] 09/16/2019, 3:52 PM
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
## 2019-09-17 ENCOUNTER — Inpatient Hospital Stay (HOSPITAL_COMMUNITY): Payer: Medicare Other

## 2019-09-17 LAB — COMPREHENSIVE METABOLIC PANEL
ALT: 17 U/L (ref 0–44)
AST: 40 U/L (ref 15–41)
Albumin: 2.9 g/dL — ABNORMAL LOW (ref 3.5–5.0)
Alkaline Phosphatase: 45 U/L (ref 38–126)
Anion gap: 11 (ref 5–15)
BUN: 46 mg/dL — ABNORMAL HIGH (ref 8–23)
CO2: 28 mmol/L (ref 22–32)
Calcium: 8.4 mg/dL — ABNORMAL LOW (ref 8.9–10.3)
Chloride: 98 mmol/L (ref 98–111)
Creatinine, Ser: 2.46 mg/dL — ABNORMAL HIGH (ref 0.44–1.00)
GFR calc Af Amer: 20 mL/min — ABNORMAL LOW (ref >60)
GFR calc non Af Amer: 17 mL/min — ABNORMAL LOW (ref >60)
Glucose, Bld: 117 mg/dL — ABNORMAL HIGH (ref 70–99)
Potassium: 3.8 mmol/L (ref 3.5–5.1)
Sodium: 137 mmol/L (ref 135–145)
Total Bilirubin: 0.5 mg/dL (ref 0.3–1.2)
Total Protein: 5.9 g/dL — ABNORMAL LOW (ref 6.5–8.1)

## 2019-09-17 LAB — CBC
HCT: 42.8 % (ref 36.0–46.0)
Hemoglobin: 13.4 g/dL (ref 12.0–15.0)
MCH: 27.2 pg (ref 26.0–34.0)
MCHC: 31.3 g/dL (ref 30.0–36.0)
MCV: 86.8 fL (ref 80.0–100.0)
Platelets: 129 10*3/uL — ABNORMAL LOW (ref 150–400)
RBC: 4.93 MIL/uL (ref 3.87–5.11)
RDW: 17.8 % — ABNORMAL HIGH (ref 11.5–15.5)
WBC: 5.6 10*3/uL (ref 4.0–10.5)
nRBC: 0.4 % — ABNORMAL HIGH (ref 0.0–0.2)

## 2019-09-17 LAB — MAGNESIUM: Magnesium: 2.4 mg/dL (ref 1.7–2.4)

## 2019-09-17 MED ORDER — SPIRONOLACTONE 25 MG PO TABS: 25.0000 mg | ORAL_TABLET | Freq: Every day | ORAL | Status: DC

## 2019-09-17 NOTE — Progress Notes (Signed)
Pt refused Lab this am. Phlebotomist will try again at 08.00 am. Pt was quite irritable because of  frequency of nursing care, monitor alarming and checking vital signs. She got out of bed without using call bell, bed alarm activated. Staff explained and emotional support given.  Vital sings stable, sinus rhythm, HR 40s-60s. Will continue to monitor.  Simrat Kendrick,RN

## 2019-09-17 NOTE — Progress Notes (Signed)
PT Cancellation Note  Patient Details Name: Gina [name] MRN: [id number] DOB: November 23, 1928   Cancelled Treatment:    Reason Eval/Treat Not Completed: Patient declined, no reason specified Encouragement provided to participate and increased OOB mobility encouraged with assist from nursing staff. PT will continue to follow acutely.    Earney Navy, PTA Acute Rehabilitation Services Pager: [phone] Office: [phone]   09/17/2019, 3:58 PM
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
## 2019-09-17 NOTE — Progress Notes (Signed)
Extensive discussion with [name] to reassess [name].  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  She just retired in 2012 at the age of 37.  Her son and her grandchildren are her only remaining family.  She does not like needing the help of an aide at home, but recognizes that she is less capable than she once was.  She says there was a time earlier this year when she was quite sick and got down to 70 pounds and thought she was going to die.  She says she was nursed back to health by a cousin who brought her some of her foods that he cans himself.  Regarding this hospitalization, she is glad to have her appetite back and feels much better than when she came in.  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  She is hopeful that we can continue to get her strength and breathing a little better and that she will be able to return home.  She is fine with going home on oxygen.  She is to have a cardiologist, but has not seen one in years and is open to a cardiology referral for further management of her heart failure.  She acknowledged that with her advanced kidney disease and heart failure, it is unlikely her condition will improve significantly.  She also admits that she sometimes likes to "sneak" a little bit, like adding salt to her food.  However, she expressed a desire to cut back on this in hopes that she can stay healthy for longer.  In light of this conversation, we will continue with our current plan of care to optimize her volume status and medications.  We did not have a chance to discuss CODE STATUS, and I will reassess this topic with her in the near future.

## 2019-09-17 NOTE — Progress Notes (Signed)
After MgSO4 IV was giving almost finished a whole bag, EKG showed sinus bradycardia, HR 28-40s. MgSO4 stopped. Pt was asymptomatic, arouseable, followed commands  but unclearly determined due to she was quit sleepy and drowsy per her baseline.  Notified Dr. Vance Gather. EKG 12 lead was done, the result posted in chart. Her BP remained stable. Will continue to monitor.  09/16/19 2321  Vitals  BP (!) 146/36  MAP (mmHg) (!) 64  BP Location Right Arm  BP Method Automatic  Patient Position (if appropriate) Lying  Pulse Rate (!) 33  Pulse Rate Source Monitor  ECG Heart Rate (!) 33  Cardiac Rhythm SB  Resp 20  Oxygen Therapy  SpO2 94 %  O2 Device Nasal Cannula  O2 Flow Rate (L/min) 2 L/min  Patient Activity (if Appropriate) In bed  Pulse Oximetry Type Continuous  Pain Assessment  Pain Scale 0-10  Pain Score 0  Glasgow Coma Scale  Eye Opening 4  Best Verbal Response (NON-intubated) 5  Best Motor Response 6  Glasgow Coma Scale Score 15  MEWS Score  MEWS RR 0  MEWS Pulse 2  MEWS Systolic 0  MEWS LOC 0  MEWS Temp 0  MEWS Score 2  MEWS Score Color Yellow  MEWS Assessment  Is this an acute change? Yes  Provider Notification  Provider Name/Title Dr. Vance Gather  Date Provider Notified 09/16/19  Time Provider Notified 2325  Notification Type Page  Notification Reason Change in status  Response No new orders  Date of Provider Response 09/16/19  Time of Provider Response 2505   Kennyth Lose, RN

## 2019-09-18 LAB — BASIC METABOLIC PANEL
Anion gap: 9 (ref 5–15)
BUN: 46 mg/dL — ABNORMAL HIGH (ref 8–23)
CO2: 30 mmol/L (ref 22–32)
Calcium: 8.2 mg/dL — ABNORMAL LOW (ref 8.9–10.3)
Chloride: 98 mmol/L (ref 98–111)
Creatinine, Ser: 1.81 mg/dL — ABNORMAL HIGH (ref 0.44–1.00)
GFR calc Af Amer: 28 mL/min — ABNORMAL LOW (ref >60)
GFR calc non Af Amer: 25 mL/min — ABNORMAL LOW (ref >60)
Glucose, Bld: 80 mg/dL (ref 70–99)
Potassium: 4.2 mmol/L (ref 3.5–5.1)
Sodium: 137 mmol/L (ref 135–145)

## 2019-09-18 LAB — CBC
HCT: 26.4 % — ABNORMAL LOW (ref 36.0–46.0)
Hemoglobin: 8.3 g/dL — ABNORMAL LOW (ref 12.0–15.0)
MCH: 27.4 pg (ref 26.0–34.0)
MCHC: 31.4 g/dL (ref 30.0–36.0)
MCV: 87.1 fL (ref 80.0–100.0)
Platelets: 168 10*3/uL (ref 150–400)
RBC: 3.03 MIL/uL — ABNORMAL LOW (ref 3.87–5.11)
RDW: 17.8 % — ABNORMAL HIGH (ref 11.5–15.5)
WBC: 4.6 10*3/uL (ref 4.0–10.5)
nRBC: 0 % (ref 0.0–0.2)

## 2019-09-18 MED ORDER — ENSURE ENLIVE PO LIQD
237.0000 mL | Freq: Two times a day (BID) | ORAL | Status: DC
  Administered 2019-09-19 – 2019-09-26 (×5): 237 mL via ORAL

## 2019-09-18 NOTE — Progress Notes (Signed)
Initial Nutrition Assessment  DOCUMENTATION CODES:   Not applicable  INTERVENTION:  -Continue Ensure Enlive po BID, each supplement provides 350 kcal and 20 grams of protein  -Magic cup BID with lunch and dinner meals, each supplement provides 290 kcal and 9 grams of protein  NUTRITION DIAGNOSIS:   Inadequate oral intake related to chronic illness, decreased appetite(CHF; CKD3) as evidenced by per patient/family report(per chart review, pt reports 2 week history of decreased appetite prior to admission; eating 60-75% of meals during admission).   GOAL:   Patient will meet greater than or equal to 90% of their needs   MONITOR:   PO intake, Weight trends, Supplement acceptance, Labs, I & O's  REASON FOR ASSESSMENT:   Malnutrition Screening Tool, Consult Assessment of nutrition requirement/status  ASSESSMENT:  RD working remotely.  85 year old female with past medical history of , EF 60-65% June 2020, HTN, PVD, HLD, CKD3, T2 DM, hypothyroidism, anemia, mesenteric ischemia, h/o breast carcinoma s/p mastectomy, recent admission from 7/12-7/21 for acute on chronic CHF s/p thoracentesis who presented with SOB and generalized swelling over the past 2 weeks. Patient endorses difficulty with ambulation, 2 week history of diffuse lip swelling, orthopnea, PND, and decreased appetite. In ED labs significant for Cr 2.12; baseline 1.6, BNP 2054, CXR showed large bilateral pleural effusions with underlying atelectatic changes and EKG showed HR in 60s, irregular rhythm, and right axis deviation.  Patient admitted with acute on chronic heart failure with preserved EF and is s/p 2 thoracentesis since admission.   Unable to obtain nutrition history at this time, per chart pt currently in diagnostic radiology. Patient documented with 60-75% po intake of meals this admission. She is provided Ensure twice daily per medication review. RD will also provide Magic Cup on lunch and dinner trays to aid with  calorie/protein needs.   Per notes, pt reports feeling well this morning and stated she will work with PT today. She is slowly improving with diuresis; will continue to optimize volume status. Renal function much improved today. Patient noted quite frail and deconditioned.   Per chart review, patient seen by inpatient RD on 01/15/19 during a previous admission. Patient diagnosed with severe malnutrition at that time and noted with severe fat and muscle depletion as well as severe weight loss. Suspect ongoing malnutrition, unable to diagnose at this time secondary to RD working remotely and unable to obtain NPFE. Oral nutrition supplements are being provided. Physical exam to be completed at follow up to identify presence and severtiy of any fat/muscle depletions.   Current wt 53.8 kg (118.4 lbs) noted BLE non-pitting edema per RN flowsheet. Weight history reviewed, 46.3 kg - 54 kg over the past 9 months.  I/Os: -2065 since admit   -1100 ml x 24 hrs UOP: 1700 ml x 24 hrs  Medications reviewed and include: Os-cal, ferrous sulfate, remeron, B12, vit E Lasix IV 120 mg BID  Labs: BUN 46 (H), Cr 1.81 (H), Hgb 8.3 (L)   NUTRITION - FOCUSED PHYSICAL EXAM: Unable to complete at this time, RD working remotely.  Diet Order:   Diet Order            Diet Heart Room service appropriate? No; Fluid consistency: Thin  Diet effective now              EDUCATION NEEDS:   No education needs have been identified at this time  Skin:  Skin Assessment: Reviewed RN Assessment  Last BM:  12/19 (type 6)  Height:  Ht Readings from Last 1 Encounters:  09/12/19 5\' 5"  (1.651 m)    Weight:   Wt Readings from Last 1 Encounters:  09/18/19 53.8 kg    Ideal Body Weight:  56.8 kg  BMI:  Body mass index is 19.74 kg/m.  Estimated Nutritional Needs:   Kcal:  1350-1500  Protein:  68-75  Fluid:  >/= 1.2 L/day   Lajuan Lines, RD, LDN Clinical Nutrition [facility] Telephone [phone] After  Hours/Weekend Pager: [phone]

## 2019-09-18 NOTE — Progress Notes (Addendum)
   Subjective:  She is feeling well this morning. Usually moves around on her own and uses a commode at home. She states she will work with PT today.   Objective:  Vital signs in last 24 hours: Vitals:   09/17/19 0843 09/17/19 1241 09/17/19 1948 09/18/19 0354  BP: (!) 145/64 (!) 146/55 134/83 137/78  Pulse: 60 70 67 69  Resp: 15 17 20 16   Temp: 98 F (36.7 C) 98.4 F (36.9 C) (!) 97.5 F (36.4 C) 97.6 F (36.4 C)  TempSrc: Oral Oral Oral Oral  SpO2: 96% 100% 94% 96%  Weight:    53.8 kg  Height:       Physical Exam Gen: Frail , elderly, pleasant, NAD CV: RRR, no murmurs, rubs, or gallops. JVD, no LEE Pulm: Poor effort, CTAB, no wheezes , rails , rhonchi  Assessment/Plan:  Principal Problem:   Acute on chronic heart failure with preserved ejection fraction (HFpEF) (HCC) Active Problems:   Essential hypertension   Iron deficiency anemia   Pleural effusion, bilateral   Acute respiratory failure with hypoxia (HCC)   Atrial fibrillation (HCC)  Gina [name] is a 85 year old female with a past medical history of HTN, HLD , IDA, CKD stage IIIb, and HFpEF here for acute hypoxic respiratory failure secondary to acute on chronic HFpEF.   #Acute hypoxic respiratory failure #Acute on chronic HFpEF #Bilateral pleural effusions Patient is status post 2 thoracentesis since admission.  Diuresing and monitoring volume status.  Urine output shows net -1.2 on admission.  -300 mL in the last 24 hours.  - Continue IV Lasix 120 mg BID  Acute on chronic kidney disease Baseline ~1.7 in July 2020. Cr improving today , 2.46 > 1.81  Atrial Fibrillation - In atrial fibrillation on admission. No history of Afib reported. History of intestinal telangiectasias and reported bright red bowel movement on admission. Will not start anticoagulation.  - Continue ASA 81mg    HTN - continue amlodipine 10 mg - holding home hydralazine   IDA - Hgb stable ~ 9 - Continue to monitor - Transfuse Hgb  below 7 -  Continue Iron and B12 supplementation   Dispo: Anticipated discharge in approximately 2-3 days.   Tamsen Snider, MD PGY1  [phone]

## 2019-09-19 LAB — CBC
HCT: 28.2 % — ABNORMAL LOW (ref 36.0–46.0)
Hemoglobin: 8.7 g/dL — ABNORMAL LOW (ref 12.0–15.0)
MCH: 27 pg (ref 26.0–34.0)
MCHC: 30.9 g/dL (ref 30.0–36.0)
MCV: 87.6 fL (ref 80.0–100.0)
Platelets: 180 10*3/uL (ref 150–400)
RBC: 3.22 MIL/uL — ABNORMAL LOW (ref 3.87–5.11)
RDW: 18.6 % — ABNORMAL HIGH (ref 11.5–15.5)
WBC: 5 10*3/uL (ref 4.0–10.5)
nRBC: 0 % (ref 0.0–0.2)

## 2019-09-19 LAB — BASIC METABOLIC PANEL
Anion gap: 9 (ref 5–15)
BUN: 51 mg/dL — ABNORMAL HIGH (ref 8–23)
CO2: 32 mmol/L (ref 22–32)
Calcium: 8.2 mg/dL — ABNORMAL LOW (ref 8.9–10.3)
Chloride: 97 mmol/L — ABNORMAL LOW (ref 98–111)
Creatinine, Ser: 1.88 mg/dL — ABNORMAL HIGH (ref 0.44–1.00)
GFR calc Af Amer: 27 mL/min — ABNORMAL LOW (ref >60)
GFR calc non Af Amer: 23 mL/min — ABNORMAL LOW (ref >60)
Glucose, Bld: 86 mg/dL (ref 70–99)
Potassium: 4.2 mmol/L (ref 3.5–5.1)
Sodium: 138 mmol/L (ref 135–145)

## 2019-09-19 NOTE — Progress Notes (Signed)
   Subjective:  Resting in bed and denies any acute changes overnight. Discussed working with PT/OT and she is agreeable. She has a home health aid and says she would like to go home on discharge. Lives alone.   Objective:  Vital signs in last 24 hours: Vitals:   09/18/19 0759 09/18/19 1207 09/18/19 1931 09/19/19 0410  BP: (!) 143/75 (!) 131/52 (!) 146/55 (!) 164/52  Pulse: 70 61 72 79  Resp: 20 16 20    Temp: 98 F (36.7 C) 98.7 F (37.1 C) 98.1 F (36.7 C) 98.3 F (36.8 C)  TempSrc: Oral Oral Oral Oral  SpO2: 96% 97% 94% 91%  Weight:    48.4 kg  Height:       Physical Exam Gen: Frail , elderly, pleasant, NAD CV: RRR, no murmurs, rubs, or gallops. JVD to jaw, no LEE Pulm: Poor effort, CTAB  Assessment/Plan:  Principal Problem:   Acute on chronic heart failure with preserved ejection fraction (HFpEF) (HCC) Active Problems:   Essential hypertension   Iron deficiency anemia   Pleural effusion, bilateral   Acute respiratory failure with hypoxia (HCC)   Atrial fibrillation (HCC)  Gina [name] is a 85 year old female with a past medical history of HTN, HLD , IDA, CKD stage IIIb, and HFpEF here for acute hypoxic respiratory failure secondary to acute on chronic HFpEF.   #Acute hypoxic respiratory failure #Acute on chronic HFpEF #Bilateral pleural effusions Patient is status post 2 thoracentesis since admission.  Diuresing and monitoring volume status.  Urine output shows net 2.4 on admission.  -1.2 mL in the last 24 hours. 53.7 > 48.4kg on admission.  - Continue IV Lasix 120 mg BID  #Nutrition - BMI ~18, consulted RD - Start Ensure, Magic Cup  Acute on chronic kidney disease Baseline ~1.7 in July 2020. Cr improved , 2.46 > 1.81 - continue to trend  Atrial Fibrillation - In atrial fibrillation on admission. No history of Afib reported. History of intestinal telangiectasias and reported bright red bowel movement on admission. Will not start anticoagulation.  -  Continue ASA 81mg    HTN - continue amlodipine 10 mg - holding home hydralazine   IDA - Hgb stable, 8.7 - Continue to monitor - Transfuse Hgb below 7 -  Continue Iron and B12 supplementation   Dispo: Anticipated discharge in approximately 2-3 days.   Tamsen Snider, MD PGY1  [phone]

## 2019-09-20 DIAGNOSIS — I5033 Acute on chronic diastolic (congestive) heart failure: Secondary | ICD-10-CM

## 2019-09-20 LAB — BASIC METABOLIC PANEL
Anion gap: 10 (ref 5–15)
BUN: 53 mg/dL — ABNORMAL HIGH (ref 8–23)
CO2: 33 mmol/L — ABNORMAL HIGH (ref 22–32)
Calcium: 8.1 mg/dL — ABNORMAL LOW (ref 8.9–10.3)
Chloride: 95 mmol/L — ABNORMAL LOW (ref 98–111)
Creatinine, Ser: 2.05 mg/dL — ABNORMAL HIGH (ref 0.44–1.00)
GFR calc Af Amer: 24 mL/min — ABNORMAL LOW (ref >60)
GFR calc non Af Amer: 21 mL/min — ABNORMAL LOW (ref >60)
Glucose, Bld: 83 mg/dL (ref 70–99)
Potassium: 4.5 mmol/L (ref 3.5–5.1)
Sodium: 138 mmol/L (ref 135–145)

## 2019-09-20 LAB — CBC
HCT: 26.4 % — ABNORMAL LOW (ref 36.0–46.0)
Hemoglobin: 8.5 g/dL — ABNORMAL LOW (ref 12.0–15.0)
MCH: 28.1 pg (ref 26.0–34.0)
MCHC: 32.2 g/dL (ref 30.0–36.0)
MCV: 87.4 fL (ref 80.0–100.0)
Platelets: 165 10*3/uL (ref 150–400)
RBC: 3.02 MIL/uL — ABNORMAL LOW (ref 3.87–5.11)
RDW: 18.6 % — ABNORMAL HIGH (ref 11.5–15.5)
WBC: 4.9 10*3/uL (ref 4.0–10.5)
nRBC: 0 % (ref 0.0–0.2)

## 2019-09-20 NOTE — Progress Notes (Signed)
PT Cancellation Note  Patient Details Name: Gina [name] MRN: [id number] DOB: 15-Nov-1928   Cancelled Treatment:    Reason Eval/Treat Not Completed: Patient declined, no reason specified attempted to see patient, she was soundly asleep and then refused PT due to wanting to eat her lunch while it was still hot. Unable to convince her to participate in PT this morning. Will attempt to return if time/schedule allow.    Windell Norfolk, DPT, PN1   Supplemental Physical Therapist [facility]    Pager [phone] Acute Rehab Office [phone]

## 2019-09-20 NOTE — Progress Notes (Signed)
PT Cancellation Note  Patient Details Name: Gina [name] MRN: [id number] DOB: [birth year]   Cancelled Treatment:    Reason Eval/Treat Not Completed: Patient declined, no reason specified made second attempt to see patient, she continues to refuse working with therapy stating "I don't feel well today and I may be going home tomorrow anyway". Emphasized that she needs to participate in mobility regardless of if she is going home or not. She continued to refuse, unable to convince her to participate.    Windell Norfolk, DPT, PN1   Supplemental Physical Therapist [facility]    Pager [phone] Acute Rehab Office [phone]

## 2019-09-20 NOTE — Progress Notes (Signed)
   Subjective:  Patient is sleeping on approach. She wakes up and reports no concerns.   Objective:  Vital signs in last 24 hours: Vitals:   09/19/19 0800 09/19/19 1140 09/19/19 2102 09/20/19 0434  BP: (!) 141/48 133/60 (!) 144/43 (!) 138/49  Pulse:  70 64 (!) 57  Resp: 16 18 20 16   Temp:  99 F (37.2 C) 98.6 F (37 C) 98.4 F (36.9 C)  TempSrc:  Oral Oral Oral  SpO2:  99% 99% 95%  Weight:    50.8 kg  Height:       Physical Exam Gen: Frail , elderly, pleasant, NAD CV: RRR, no murmurs, rubs, or gallops. JVD midway to jaw,  no LEE Pulm: Poor effort, CTAB  Assessment/Plan:  Principal Problem:   Acute on chronic heart failure with preserved ejection fraction (HFpEF) (HCC) Active Problems:   Essential hypertension   Iron deficiency anemia   Pleural effusion, bilateral   Acute respiratory failure with hypoxia (HCC)   Atrial fibrillation (HCC)  Gina [name] is a 85 year old female with a past medical history of HTN, HLD , IDA, CKD stage IIIb, and HFpEF here for acute hypoxic respiratory failure secondary to acute on chronic HFpEF.   #Acute hypoxic respiratory failure #Acute on chronic HFpEF #Bilateral pleural effusions Patient is status post 2 thoracentesis since admission.  Diuresing and monitoring volume status.  Urine output shows net 3.9 on admission.  -1 L in the last 24 hours. Weights are not correlating with diuresis.  - Continue IV Lasix 120 mg BID - Consult Cardiology   #Nutrition - BMI ~18, consulted RD  - Continue Ensure, Magic Cup  Acute on chronic kidney disease Baseline ~1.7 in July 2020. Cr improved , 2.46 > 1.8 , slight bump to 2.05 overnight.  - continue to trend  Atrial Fibrillation - In atrial fibrillation on admission. No history of Afib reported. History of intestinal telangiectasias and reported bright red bowel movement on admission. Will not start anticoagulation.  - Continue ASA 81mg    HTN - continue amlodipine 10 mg - holding home  hydralazine   IDA - Hgb stable, 8.7 - Continue to monitor - Transfuse Hgb below 7 -  Continue Iron and B12 supplementation   Dispo: Anticipated discharge in approximately 2-3 days.   Tamsen Snider, MD PGY1  [phone]
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
## 2019-09-20 NOTE — TOC Initial Note (Addendum)
Transition of Care [facility]) - Initial/Assessment Note    Patient Details  Name: Gina [name] MRN: [id number] Date of Birth: 1929-04-30  Transition of Care [facility]) CM/SW Contact:    Carles Collet, RN Phone Number: 09/20/2019, 3:27 PM  Clinical Narrative:                 Damaris Schooner w patient at bedside. She could not remember some specific details about her home health and deferred conversation to her niece. I spoke w her niece over the phone. She states that she has an aid w [facility] 7 days a week, had HH with [facility] in the Fall and would like to have them again. Referral made to [facility], waiting to hear back. If [facility] cannot take, I reviewed [place] ratings of other companies with niece on the phone and she is agreeable to any with 4 star rating.  Patient has all needed DME at home. Niece is able to provide transportation home for patient and will bring portable oxygen tank. Patient has home oxygen with [employer].   [facility] refused to take.  Will need to try additional [facility] providers         Patient Goals and CMS Choice        Expected Discharge Plan and Services                                                Prior Living Arrangements/Services                       Activities of Daily Living Home Assistive Devices/Equipment: Grab bars in shower, Grab bars around toilet, Walker (specify type) ADL Screening (condition at time of admission) Patient's cognitive ability adequate to safely complete daily activities?: Yes Is the patient deaf or have difficulty hearing?: No Does the patient have difficulty seeing, even when wearing glasses/contacts?: No Does the patient have difficulty concentrating, remembering, or making decisions?: No Patient able to express need for assistance with ADLs?: Yes Does the patient have difficulty dressing or bathing?: No Independently performs ADLs?: Yes (appropriate for developmental age) Does the patient have difficulty walking or  climbing stairs?: Yes Weakness of Legs: Both Weakness of Arms/Hands: None  Permission Sought/Granted                  Emotional Assessment              Admission diagnosis:  Shortness of breath [R06.02] Pleural effusion, bilateral [J90] Patient Active Problem List   Diagnosis Date Noted  . Acute on chronic heart failure with preserved ejection fraction (HFpEF) ([place]) 09/13/2019  . Atrial fibrillation ([place]) 09/13/2019  . Pleural effusion, bilateral 09/12/2019  . Acute respiratory failure with hypoxia ([place]) 09/12/2019  . Heart failure ([place]) 04/11/2019  . TIA (transient ischemic attack) 03/26/2019  . PAD (peripheral artery disease) ([place]) 02/03/2019  . Elevated troponin 01/15/2019  . ARF (acute renal failure) ([place]) 01/14/2019  . Normochromic normocytic anemia 01/14/2019  . Hypertensive heart disease with heart failure ([place]) 10/12/2018  . CKD (chronic kidney disease) stage 3, GFR 30-59 ml/min 10/12/2018  . Pseudophakia of both eyes 07/10/2017  . Postsurgical states following surgery of eye and adnexa 05/28/2017  . Primary open-angle glaucoma 05/26/2017  . History of AVM (arteriovenous malformation) of duodenum, acquired with hemorrhage 10/24/2016  . Iron deficiency anemia 10/24/2016  .  History of duodenal ulcer 10/24/2016  . Fatigue 01/22/2016  . Chronic diastolic congestive heart failure ([place]) 01/22/2016  . Postablative hypothyroidism 07/19/2014  . His of mesenteric ischemia 01/25/2014  . History of gastric ulcer 05/12/2013  . Protein-calorie malnutrition, severe ([place]) 05/07/2013  . Syncope 05/06/2013  . Fall 03/04/2013  . Thyroid nodule 03/04/2013  . Loss of weight 01/28/2013  . Constipation 09/10/2012  . DDD (degenerative disc disease), cervical 07/22/2012  . Hip pain, bilateral 07/26/2011  . Preventative health care 07/26/2011  . PVD (peripheral vascular disease) ([place])   . Diabetes mellitus, type 2 ([place])   . TB lung, latent   . HLD (hyperlipidemia) 05/23/2008   . PULMONARY NODULE 12/29/2007  . Essential hypertension 08/04/2006   PCP:  [name], NP Pharmacy:   [place], [place] - [address] [place] [address] Phone: [phone] Fax: [place], [place] [address] Phone: [phone] Fax: [phone]     Social Determinants of Health (SDOH) Interventions    Readmission Risk Interventions Readmission Risk Prevention Plan 04/20/2019 04/15/2019  Transportation Screening Complete Complete  PCP or Specialist Appt within 3-5 Days Complete -  HRI or Home Care Consult Complete -  Social Work Consult for [place] Planning/Counseling Complete -  Palliative Care Screening Not Applicable -  Medication Review (RN Care Manager) Complete -  HRI or [place] - Complete  Palliative Care Screening - Not [place] - Not Applicable  Some recent data might be hidden

## 2019-09-20 NOTE — Progress Notes (Signed)
Occupational Therapy Treatment Patient Details Name: Gina [name] MRN: [id number] DOB: [birth year] Today's Date: 09/20/2019    History of present illness 85 year old female with a history of HFpEF, DM, HTN, PVD, HLD, CKD IIIb, anemia, and breast carcinoma s/p mastectomy who presents with shortness of breath and edema, orthopnea, paroxysmal nocturnal dyspnea, and decreased appetite. Thoracentisis 12/14 and 12/16.   OT comments  Pt making steady progress towards OT goals this session. Pt required MIN A- min guard for all functional mobility and transfers with RW. Pt completed functional mobility from EOB around bed to recliner as pt found to be wet. Pt required cues to manage RW. Pt on 2.5L O2 with pt de-sating to 83% needing seated rest break and PLB technique to rebound to 91%. DC plan remains appropriate, will continue to follow acutely per POC.    Follow Up Recommendations  Home health OT;Supervision/Assistance - 24 hour(initially)    Equipment Recommendations  None recommended by OT    Recommendations for Other Services      Precautions / Restrictions Precautions Precautions: Fall Restrictions Weight Bearing Restrictions: No       Mobility Bed Mobility Overal bed mobility: Needs Assistance Bed Mobility: Supine to Sit     Supine to sit: Min assist;HOB elevated     General bed mobility comments: MIN A to sequence and initiate BLE movement to EOB and scoot hips to EOB  Transfers Overall transfer level: Needs assistance Equipment used: Rolling walker (2 wheeled) Transfers: Sit to/from Stand Sit to Stand: Min guard;Min assist;From elevated surface         General transfer comment: cues for hand placement; x2 trials MIN A first trial to power up into standing, min guard second trial for balance    Balance Overall balance assessment: Needs assistance Sitting-balance support: No upper extremity supported;Feet supported Sitting balance-Leahy Scale: Good      Standing balance support: Bilateral upper extremity supported;During functional activity Standing balance-Leahy Scale: Poor                             ADL either performed or assessed with clinical judgement   ADL Overall ADL's : Needs assistance/impaired                         Toilet Transfer: Min guard;Ambulation;RW;Minimal assistance Toilet Transfer Details (indicate cue type and reason): simulated in room         Functional mobility during ADLs: Min guard;Rolling walker;Minimal assistance General ADL Comments: session focus on functional mobility from EOB>recliner. Pt limited by decreased activity tolerance and generalized weakness. pt on 2.5 L O2 with pt de-sating to 82%, able to rebound to 91% with PLB and seated rest break     Vision Baseline Vision/History: No visual deficits     Perception     Praxis      Cognition Arousal/Alertness: Awake/alert Behavior During Therapy: WFL for tasks assessed/performed Overall Cognitive Status: Within Functional Limits for tasks assessed                                          Exercises     Shoulder Instructions       General Comments pt on 2.5 L O2 with pt de-sating to 83% with functional mobility, Pt required seated rest break and PLB to rebound to  91%    Pertinent Vitals/ Pain       Pain Assessment: No/denies pain  Home Living                                          Prior Functioning/Environment              Frequency  Min 3X/week        Progress Toward Goals  OT Goals(current goals can now be found in the care plan section)  Progress towards OT goals: Progressing toward goals  Acute Rehab OT Goals Patient Stated Goal: to go home OT Goal Formulation: With patient Time For Goal Achievement: 09/27/19 Potential to Achieve Goals: Good  Plan Discharge plan remains appropriate;Frequency remains appropriate    Co-evaluation                  AM-PAC OT "6 Clicks" Daily Activity     Outcome Measure   Help from another person eating meals?: None     Help from another person bathing (including washing, rinsing, drying)?: A Little Help from another person to put on and taking off regular upper body clothing?: A Little Help from another person to put on and taking off regular lower body clothing?: A Little 6 Click Score: 13    End of Session Equipment Utilized During Treatment: Gait belt;Rolling walker;Other (comment)(2.5 L O2)  OT Visit Diagnosis: Unsteadiness on feet (R26.81);Muscle weakness (generalized) (M62.81);Other symptoms and signs involving cognitive function   Activity Tolerance Patient tolerated treatment well   Patient Left in chair;with call bell/phone within reach   Nurse Communication Mobility status        Time: 2080-2233 OT Time Calculation (min): 25 min  Charges: OT General Charges $OT Visit: 1 Visit OT Treatments $Therapeutic Activity: 23-37 mins  Lanier Clam., COTA/L Acute Rehabilitation Services [phone] [place] 09/20/2019, 4:39 PM

## 2019-09-21 ENCOUNTER — Other Ambulatory Visit (INDEPENDENT_AMBULATORY_CARE_PROVIDER_SITE_OTHER): Payer: Self-pay | Admitting: Primary Care

## 2019-09-21 LAB — CBC
HCT: 27.6 % — ABNORMAL LOW (ref 36.0–46.0)
Hemoglobin: 8.5 g/dL — ABNORMAL LOW (ref 12.0–15.0)
MCH: 27.2 pg (ref 26.0–34.0)
MCHC: 30.8 g/dL (ref 30.0–36.0)
MCV: 88.5 fL (ref 80.0–100.0)
Platelets: 172 10*3/uL (ref 150–400)
RBC: 3.12 MIL/uL — ABNORMAL LOW (ref 3.87–5.11)
RDW: 19.3 % — ABNORMAL HIGH (ref 11.5–15.5)
WBC: 3.8 10*3/uL — ABNORMAL LOW (ref 4.0–10.5)
nRBC: 0 % (ref 0.0–0.2)

## 2019-09-21 LAB — BASIC METABOLIC PANEL
Anion gap: 8 (ref 5–15)
BUN: 55 mg/dL — ABNORMAL HIGH (ref 8–23)
CO2: 36 mmol/L — ABNORMAL HIGH (ref 22–32)
Calcium: 8.4 mg/dL — ABNORMAL LOW (ref 8.9–10.3)
Chloride: 92 mmol/L — ABNORMAL LOW (ref 98–111)
Creatinine, Ser: 1.95 mg/dL — ABNORMAL HIGH (ref 0.44–1.00)
GFR calc Af Amer: 26 mL/min — ABNORMAL LOW (ref >60)
GFR calc non Af Amer: 22 mL/min — ABNORMAL LOW (ref >60)
Glucose, Bld: 91 mg/dL (ref 70–99)
Potassium: 4.2 mmol/L (ref 3.5–5.1)
Sodium: 136 mmol/L (ref 135–145)

## 2019-09-21 MED ORDER — SODIUM CHLORIDE 0.9 % IV SOLN
510.0000 mg | Freq: Once | INTRAVENOUS | Status: AC
  Administered 2019-09-21: 13:00:00 510 mg via INTRAVENOUS
  Filled 2019-09-21: qty 17

## 2019-09-21 MED ORDER — TORSEMIDE 20 MG PO TABS
20.0000 mg | ORAL_TABLET | Freq: Every day | ORAL | Status: DC
  Administered 2019-09-21: 09:00:00 20 mg via ORAL
  Filled 2019-09-21: qty 1

## 2019-09-21 MED ORDER — TORSEMIDE 20 MG PO TABS
20.0000 mg | ORAL_TABLET | Freq: Two times a day (BID) | ORAL | Status: DC
  Administered 2019-09-21 – 2019-09-22 (×2): 20 mg via ORAL
  Filled 2019-09-21 (×2): qty 1

## 2019-09-21 NOTE — Progress Notes (Addendum)
Progress Note  Patient Name: Gina [name] Date of Encounter: 09/21/2019  Primary Cardiologist: Dr. Percival Spanish   Subjective   Somewhat lethargic and sleepy this morning. No chest pain or dyspnea.   Inpatient Medications    Scheduled Meds: . sodium chloride   Intravenous Once  . amLODipine  10 mg Oral Daily  . aspirin EC  81 mg Oral Daily  . atorvastatin  20 mg Oral q1800  . calcium carbonate  500 mg of elemental calcium Oral Daily  . feeding supplement (ENSURE ENLIVE)  237 mL Oral BID BM  . ferrous sulfate  325 mg Oral Q breakfast  . latanoprost  1 drop Both Eyes QHS  . levothyroxine  112 mcg Oral Q0600  . linaclotide  145 mcg Oral QAC breakfast  . mirtazapine  7.5 mg Oral QHS  . sodium chloride flush  3 mL Intravenous Once  . torsemide  20 mg Oral Daily  . vitamin B-12  500 mcg Oral Daily  . vitamin E  400 Units Oral Daily   Continuous Infusions:  PRN Meds: albuterol   Vital Signs    Vitals:   09/20/19 2021 09/21/19 0347 09/21/19 0412 09/21/19 0816  BP: 138/60 (!) 118/48  (!) 128/57  Pulse: 66   (!) 57  Resp: 18 12  16   Temp: 98.6 F (37 C) 98.2 F (36.8 C)  98 F (36.7 C)  TempSrc: Oral Oral  Oral  SpO2: 97% 96%  96%  Weight:   50.3 kg   Height:        Intake/Output Summary (Last 24 hours) at 09/21/2019 0843 Last data filed at 09/20/2019 1859 Gross per 24 hour  Intake 240 ml  Output 400 ml  Net -160 ml   Last 3 Weights 09/21/2019 09/20/2019 09/19/2019  Weight (lbs) 110 lb 14.3 oz 111 lb 15.9 oz 106 lb 11.2 oz  Weight (kg) 50.3 kg 50.8 kg 48.4 kg      Telemetry    Sinus rhythm with PACs- Personally Reviewed  ECG    No new tracing   Physical Exam   GEN: frail elderly female in no acute distress.   Neck: No JVD Cardiac: RRR, no murmurs, rubs, or gallops.  Respiratory: diminished breath sound at base GI: Soft, nontender, non-distended  MS: No edema; No deformity. Neuro:  Nonfocal  Psych: lethargic   Labs    High Sensitivity  Troponin:   Recent Labs  Lab 09/12/19 1527 09/12/19 1900  TROPONINIHS 40* 43*      Chemistry Recent Labs  Lab 09/15/19 0333 09/16/19 0329 09/17/19 1000 09/19/19 0725 09/20/19 0336 09/21/19 0331  NA 138 138 137 138 138 136  K 3.8 4.0 3.8 4.2 4.5 4.2  CL 103 104 98 97* 95* 92*  CO2 25 25 28  32 33* 36*  GLUCOSE 99 88 117* 86 83 91  BUN 48* 46* 46* 51* 53* 55*  CREATININE 2.37* 2.41* 2.46* 1.88* 2.05* 1.95*  CALCIUM 8.8* 8.2* 8.4* 8.2* 8.1* 8.4*  PROT 6.2* 5.4* 5.9*  --   --   --   ALBUMIN  --  2.7* 2.9*  --   --   --   AST  --  64* 40  --   --   --   ALT  --  16 17  --   --   --   ALKPHOS  --  43 45  --   --   --   BILITOT  --  0.4 0.5  --   --   --  GFRNONAA 18* 17* 17* 23* 21* 22*  GFRAA 21* 20* 20* 27* 24* 26*  ANIONGAP 10 9 11 9 10 8      Hematology Recent Labs  Lab 09/19/19 0725 09/20/19 0336 09/21/19 0331  WBC 5.0 4.9 3.8*  RBC 3.22* 3.02* 3.12*  HGB 8.7* 8.5* 8.5*  HCT 28.2* 26.4* 27.6*  MCV 87.6 87.4 88.5  MCH 27.0 28.1 27.2  MCHC 30.9 32.2 30.8  RDW 18.6* 18.6* 19.3*  PLT 180 165 172    Radiology    No results found.  Cardiac Studies  Echo 09/16/2019 1. Left ventricular ejection fraction, by visual estimation, is 60 to 65%. The left ventricle has normal function. There is moderately increased left ventricular hypertrophy. 2. Elevated left atrial and left ventricular end-diastolic pressures. 3. Left ventricular diastolic parameters are consistent with Grade I diastolic dysfunction (impaired relaxation). 4. The left ventricle has no regional wall motion abnormalities. 5. Global right ventricle has normal systolic function.The right ventricular size is normal. No increase in right ventricular wall thickness. 6. Left atrial size was moderately dilated. 7. Right atrial size was normal. 8. The mitral valve is abnormal. Mild mitral valve regurgitation. 9. The tricuspid valve is grossly normal. Tricuspid valve regurgitation is mild. 10. The  aortic valve is tricuspid. Aortic valve regurgitation is not visualized. 11. The pulmonic valve was grossly normal. Pulmonic valve regurgitation is trivial. 12. The inferior vena cava is normal in size with greater than 50% respiratory variability, suggesting right atrial pressure of 3 mmHg. 13. Large pleural effusion in both left and right lateral regions. 14. Trivial pericardial effusion is present.   Patient Profile     Gina [name] is a 85 y.o. female with a hx of PVD followed by vascular (s/p bilateral femoral-popliteal bypass graft and left peroneal artery angioplasty), HTN, iron deficiency anemia, HLD, CKD III-IV, DM, thyroid disease and breast cancer s/p R mastectomy who is being seen  for the evaluation of CHF at the request of Dr. Court Joy.   Assessment & Plan    1. Acute on chronic diastolic CHF/Acute hypoxic respiratory failure/bilateral pleural effusion  - S/p bilateral thoracentisis with transudate process. Echo showed preserved LVEF with grade 1 DD. IV diuresis with good response and improved SCr to 1.81 however Cardiology asked to evaluated to due to worsening renal function to 2.05. She felt euvolemic and change IV lasix to Torsemide >> renal function improved to 1.95 today.   2. Arrhythmias  - Admit EKG on 12/13 with "undermined rhythm" and artifacts. This does not correlate with telemetry which showed Sinus rhythm with PACs. No documented afib.   3. Acute on CKD III-IV - As above  4. HTN - BP relatively stable on amlodipine    [facility] will sign off.   Medication Recommendations:  Continue Torsemide, change to Lasix 60 mg every day at discharge  Other recommendations (labs, testing, etc):  HH Follow up as an outpatient:  PRN Cardiology follow up as outpatient. Follow up with PCP in 1-2 weeks   For questions or updates, please contact [place] Please consult [email or web address] for contact info under        SignedLeanor Kail, PA  09/21/2019,  8:43 AM    History and all data above reviewed.  Patient examined.  I agree with the findings as above.  She denies pain or SOB.  The patient exam reveals COR: RRR  ,  Lungs: Clear  ,  Abd: Positive bowel sounds, no rebound no guarding, Ext No  edema  .  All available labs, radiology testing, previous records reviewed. Agree with documented assessment and plan.   Frail but no distress.  Changed to PO diuretic and this could be back to Lasix at home since she has this.  I would increase this dose as she did not seem to do well with 40 mg.  However, she would need close follow up of her renal function.  No evidence of atrial fib.       Jeneen Rinks Shar Paez  10:19 AM  09/21/2019

## 2019-09-21 NOTE — Progress Notes (Signed)
Occupational Therapy Treatment Patient Details Name: Gina [name] MRN: [id number] DOB: 1928/10/11 Today's Date: 09/21/2019    History of present illness Pt is an 85 year old female with a history of HFpEF, DM, HTN, PVD, HLD, CKD IIIb, anemia, and breast carcinoma s/p mastectomy, who presents 09/12/19 with SOB, edema, orthopnea, paroxysmal nocturnal dyspnea, and decreased appetite. Worked up for CHF exacerbation. Bilateral pleural effusions, s/p thoracentisis 12/14 and 12/16.   OT comments  Pt making steady progress towards OT goals this session. Pt asleep upon arrival in recliner, but requesting to return to bed. Assisted pt back to bed via stand pivot transfer with RW and min guard assist. Pt continues to be limited by generalized weakness and decreased activity tolerance. Pt on 2 L O2 with sats > 90% throughout session. Pt declined further ADLs or functional mobility. Pt likely to DC home tomorrow with [place]. Will continue to follow acutely per POC.    Follow Up Recommendations  Home health OT;Supervision/Assistance - 24 hour(initially)    Equipment Recommendations  None recommended by OT    Recommendations for Other Services      Precautions / Restrictions Precautions Precautions: Fall Precaution Comments: Baseline 2L O2 PRN Restrictions Weight Bearing Restrictions: No       Mobility Bed Mobility Overal bed mobility: Modified Independent Bed Mobility: Sit to Supine     Supine to sit: Modified independent (Device/Increase time);HOB elevated Sit to supine: Modified independent (Device/Increase time)   General bed mobility comments: Increased time and effort, no physical assist required  Transfers Overall transfer level: Needs assistance Equipment used: Rolling walker (2 wheeled) Transfers: Sit to/from Stand Sit to Stand: Supervision Stand pivot transfers: Min guard       General transfer comment: pt complete stand pivot from recliner >EOB with RW and min guard for  safety, pt with good safety awareness noted    Balance Overall balance assessment: Needs assistance Sitting-balance support: No upper extremity supported;Feet supported Sitting balance-Leahy Scale: Good     Standing balance support: Bilateral upper extremity supported;During functional activity Standing balance-Leahy Scale: Fair Standing balance comment: Can static stand without UE support                           ADL either performed or assessed with clinical judgement   ADL Overall ADL's : Needs assistance/impaired                         Toilet Transfer: Min guard;Ambulation;RW Toilet Transfer Details (indicate cue type and reason): simulated in room from recliner>EOB, good safety awareness noted         Functional mobility during ADLs: Min guard;Rolling walker General ADL Comments: session focus on functional mobility and increasing activity tolerance for BADL participation     Vision Baseline Vision/History: No visual deficits     Perception     Praxis      Cognition Arousal/Alertness: Awake/alert Behavior During Therapy: WFL for tasks assessed/performed Overall Cognitive Status: Within Functional Limits for tasks assessed                                          Exercises     Shoulder Instructions       General Comments on 2L O2 throughout session with sats > 90% throughout session    Pertinent Vitals/ Pain  Pain Assessment: No/denies pain  Home Living                                          Prior Functioning/Environment              Frequency  Min 3X/week        Progress Toward Goals  OT Goals(current goals can now be found in the care plan section)  Progress towards OT goals: Progressing toward goals  Acute Rehab OT Goals Patient Stated Goal: to go home OT Goal Formulation: With patient Time For Goal Achievement: 09/27/19 Potential to Achieve Goals: Good  Plan  Discharge plan remains appropriate;Frequency remains appropriate    Co-evaluation                 AM-PAC OT "6 Clicks" Daily Activity     Outcome Measure   Help from another person eating meals?: None Help from another person taking care of personal grooming?: A Little Help from another person toileting, which includes using toliet, bedpan, or urinal?: A Little Help from another person bathing (including washing, rinsing, drying)?: A Little Help from another person to put on and taking off regular upper body clothing?: A Little Help from another person to put on and taking off regular lower body clothing?: A Little 6 Click Score: 19    End of Session Equipment Utilized During Treatment: Rolling walker;Oxygen;Other (comment)(2 L O2)  OT Visit Diagnosis: Unsteadiness on feet (R26.81);Muscle weakness (generalized) (M62.81);Other symptoms and signs involving cognitive function   Activity Tolerance Patient tolerated treatment well   Patient Left in bed;with call bell/phone within reach;with bed alarm set   Nurse Communication Mobility status;Other (comment)(gave pt coffee and crackers)        Time: 1610-9604 OT Time Calculation (min): 15 min  Charges: OT General Charges $OT Visit: 1 Visit OT Treatments $Self Care/Home Management : 8-22 mins  Lanier Clam., COTA/L Acute Rehabilitation Services [phone] [place] 09/21/2019, 4:03 PM

## 2019-09-21 NOTE — Progress Notes (Signed)
Physical Therapy Treatment Patient Details Name: Gina [name] MRN: [id number] DOB: 1929-01-06 Today's Date: 09/21/2019    History of Present Illness Pt is an 85 year old female with a history of HFpEF, DM, HTN, PVD, HLD, CKD IIIb, anemia, and breast carcinoma s/p mastectomy, who presents 09/12/19 with SOB, edema, orthopnea, paroxysmal nocturnal dyspnea, and decreased appetite. Worked up for CHF exacerbation. Bilateral pleural effusions, s/p thoracentisis 12/14 and 12/16.   PT Comments    Pt slowly progressing with mobility, limited participation due to fatigue. Able to stand and walk short distance with RW and min guard. Recommend discharge home with w/c for longer household and community distances. Pt reports she will have necessary assist from family. Continue to recommend HHPT services to maximize functional mobility and independence.  SpO2 down to 86% on RA, maintaining >/91% on 2L O2    Follow Up Recommendations  Home health PT;Supervision/Assistance - 24 hour     Equipment Recommendations  Rolling walker with 5" wheels;3in1 (PT);Wheelchair (measurements PT);Wheelchair cushion (measurements PT)    Recommendations for Other Services       Precautions / Restrictions Precautions Precautions: Fall Precaution Comments: Baseline 2L O2 PRN Restrictions Weight Bearing Restrictions: No    Mobility  Bed Mobility Overal bed mobility: Modified Independent Bed Mobility: Supine to Sit     Supine to sit: Modified independent (Device/Increase time);HOB elevated     General bed mobility comments: Increased time and effort, no physical assist required  Transfers Overall transfer level: Needs assistance Equipment used: Rolling walker (2 wheeled) Transfers: Sit to/from Stand Sit to Stand: Supervision         General transfer comment: Stood from bed and recliner to RW with supervision for safety, good hand placement; heavy reliance on UE support; increased  time  Ambulation/Gait Ambulation/Gait assistance: Min guard Gait Distance (Feet): 4 Feet Assistive device: Rolling walker (2 wheeled) Gait Pattern/deviations: Decreased stride length;Trunk flexed;Step-to pattern     General Gait Details: Short distance to recliner with RW and min guard; pt declining further distance due to fatigue   Stairs             Wheelchair Mobility    Modified Rankin (Stroke Patients Only)       Balance Overall balance assessment: Needs assistance Sitting-balance support: No upper extremity supported;Feet supported Sitting balance-Leahy Scale: Good     Standing balance support: Bilateral upper extremity supported;During functional activity Standing balance-Leahy Scale: Fair Standing balance comment: Can static stand without UE support                            Cognition Arousal/Alertness: Awake/alert Behavior During Therapy: WFL for tasks assessed/performed Overall Cognitive Status: Within Functional Limits for tasks assessed                                        Exercises      General Comments General comments (skin integrity, edema, etc.): SpO2 down to 87% on RA, returning to >/91% on 2L O2      Pertinent Vitals/Pain Pain Assessment: No/denies pain    Home Living                      Prior Function            PT Goals (current goals can now be found in the care plan section)  Progress towards PT goals: Progressing toward goals    Frequency    Min 3X/week      PT Plan Current plan remains appropriate    Co-evaluation              AM-PAC PT "6 Clicks" Mobility   Outcome Measure  Help needed turning from your back to your side while in a flat bed without using bedrails?: None Help needed moving from lying on your back to sitting on the side of a flat bed without using bedrails?: None Help needed moving to and from a bed to a chair (including a wheelchair)?: A Little Help  needed standing up from a chair using your arms (e.g., wheelchair or bedside chair)?: A Little Help needed to walk in hospital room?: A Little Help needed climbing 3-5 steps with a railing? : A Lot 6 Click Score: 19    End of Session Equipment Utilized During Treatment: Oxygen Activity Tolerance: Patient limited by fatigue Patient left: in chair;with call bell/phone within reach;with chair alarm set Nurse Communication: Mobility status PT Visit Diagnosis: Unsteadiness on feet (R26.81);Muscle weakness (generalized) (M62.81);Difficulty in walking, not elsewhere classified (R26.2)     Time: 9396-8864 PT Time Calculation (min) (ACUTE ONLY): 21 min  Charges:  $Therapeutic Activity: 8-22 mins                    Mabeline Caras, PT, DPT Acute Rehabilitation Services  Pager [phone] Office [place] 09/21/2019, 12:17 PM

## 2019-09-21 NOTE — Progress Notes (Signed)
Nutrition Follow up  DOCUMENTATION CODES:   Not applicable  INTERVENTION:   -Continue Ensure Enlive po BID, each supplement provides 350 kcal and 20 grams of protein -Continue Magic cup BID with lunch and dinner meals, each supplement provides 290 kcal and 9 grams of protein  NUTRITION DIAGNOSIS:   Inadequate oral intake related to chronic illness, decreased appetite(CHF; CKD3) as evidenced by per patient/family report(per chart review, pt reports 2 week history of decreased appetite prior to admission; eating 60-75% of meals during admission).  Ongoing   GOAL:   Patient will meet greater than or equal to 90% of their needs   Progressing   MONITOR:   PO intake, Weight trends, Supplement acceptance, Labs, I & O's  REASON FOR ASSESSMENT:   Malnutrition Screening Tool, Consult Assessment of nutrition requirement/status  ASSESSMENT:   85 year old female with past medical history of , EF 60-65% June 2020, HTN, PVD, HLD, CKD3, T2 DM, hypothyroidism, anemia, mesenteric ischemia, h/o breast carcinoma s/p mastectomy, recent admission from 7/12-7/21 for acute on chronic CHF s/p thoracentesis who presented with SOB and generalized swelling over the past 2 weeks. Patient endorses difficulty with ambulation, 2 week history of diffuse lip swelling, orthopnea, PND, and decreased appetite. In ED labs significant for Cr 2.12; baseline 1.6, BNP 2054, CXR showed large bilateral pleural effusions with underlying atelectatic changes and EKG showed HR in 60s, irregular rhythm, and right axis deviation.   Pt s/p two thoracentesis since admit. Continues to diurese with lasix.   Appetite progressing. Meal completions charted as 50-75% for her last eight meals. Drinking Ensure once daily. Will continue current interventions. Plan discharge in 1-2 day with home heath.   Admission weight: 53.1 kg  Current weight: 50.3 kg    I/O: -3,886 ml since admit UOP: 1,200 ml x 24 hrs   Medications: os-cal,  ferrous, remeron, 20 mg demadex BID, Vit B12, Vit E Labs: Cr 1.95-plateau CBG 83-91   Diet Order:   Diet Order            Diet Heart Room service appropriate? No; Fluid consistency: Thin  Diet effective now              EDUCATION NEEDS:   No education needs have been identified at this time  Skin:  Skin Assessment: Reviewed RN Assessment  Last BM:  12/20  Height:   Ht Readings from Last 1 Encounters:  09/12/19 5\' 5"  (1.651 m)    Weight:   Wt Readings from Last 1 Encounters:  09/21/19 50.3 kg    Ideal Body Weight:  56.8 kg  BMI:  Body mass index is 18.45 kg/m.  Estimated Nutritional Needs:   Kcal:  1350-1500  Protein:  68-75  Fluid:  >/= 1.2 L/day   Mariana Single RD, LDN Clinical Nutrition Pager # - [phone]

## 2019-09-21 NOTE — Progress Notes (Addendum)
   Subjective:  Patient is awake and eating breakfast. Visible crusted blood in nostrils, patient reports a nose bleed which stopped. Discussed transition to Torsemide today. Encouraged patient to work with PT/OT.   Objective:  Vital signs in last 24 hours: Vitals:   09/20/19 2021 09/21/19 0347 09/21/19 0412 09/21/19 0816  BP: 138/60 (!) 118/48  (!) 128/57  Pulse: 66   (!) 57  Resp: 18 12  16   Temp: 98.6 F (37 C) 98.2 F (36.8 C)  98 F (36.7 C)  TempSrc: Oral Oral  Oral  SpO2: 97% 96%  96%  Weight:   50.3 kg   Height:       Physical Exam Gen: Frail , elderly, on [place], blood  in and below nares CV: RRR, no murmurs, rubs, or gallops. Pulm: poor air movement , fine rales in lower and mid lung fields.   Assessment/Plan:  Principal Problem:   Acute on chronic heart failure with preserved ejection fraction (HFpEF) (HCC) Active Problems:   Essential hypertension   Iron deficiency anemia   Pleural effusion, bilateral   Acute respiratory failure with hypoxia (HCC)   Atrial fibrillation (HCC)  Gina [name] is a 85 year old female with a past medical history of HTN, HLD , IDA, CKD stage IIIb, and HFpEF here for acute hypoxic respiratory failure secondary to acute on chronic HFpEF.   #Acute hypoxic respiratory failure #Acute on chronic HFpEF #Bilateral pleural effusions Patient is status post 2 thoracentesis since admission.  Diuresing and monitoring volume status. Came in on lasix 40 mg daily and started on IV Lasix 120 mg BID. Urine output shows net 3.5 on admission.  -1.2 L in the last 24 hours. Weights are not correlating with diuresis.  Greatly appreciate cardiology consult. - Torsemide 20 mg BID , switch to Lasix 40 mg BID at discharge  - Appreciate Cardiology recommendations  #Nutrition - BMI ~18, consulted RD  - Continue Ensure, Magic Cup  Acute on chronic kidney disease Baseline ~1.7 in July 2020. Cr 2.46 , improved with diuresis > 1.8 on 12/19. Mild fluctuation  since. 1.95 today.  - continue to trend  Arrhythmia - Agree with cardiology review of ecg, irregulare rate, but patient in sinus rhythm.    HTN - continue amlodipine 10 mg - holding home hydralazine   IDA - Hgb stable, 8.5 - Continue to monitor - Transfuse Hgb below 7 -  Continue Iron and B12 supplementation  - Received one dose of  Feraheme on 12/14, second dose today 12/22  Dispo: Anticipated discharge in approximately 1-2 days.   Tamsen Snider, MD PGY1  [phone]

## 2019-09-22 ENCOUNTER — Telehealth: Payer: Self-pay | Admitting: Cardiology

## 2019-09-22 ENCOUNTER — Inpatient Hospital Stay (HOSPITAL_COMMUNITY): Payer: Medicare Other

## 2019-09-22 DIAGNOSIS — Z9981 Dependence on supplemental oxygen: Secondary | ICD-10-CM

## 2019-09-22 DIAGNOSIS — I499 Cardiac arrhythmia, unspecified: Secondary | ICD-10-CM

## 2019-09-22 HISTORY — PX: IR THORACENTESIS ASP PLEURAL SPACE W/IMG GUIDE: IMG5380

## 2019-09-22 LAB — BASIC METABOLIC PANEL
Anion gap: 8 (ref 5–15)
BUN: 55 mg/dL — ABNORMAL HIGH (ref 8–23)
CO2: 34 mmol/L — ABNORMAL HIGH (ref 22–32)
Calcium: 8 mg/dL — ABNORMAL LOW (ref 8.9–10.3)
Chloride: 94 mmol/L — ABNORMAL LOW (ref 98–111)
Creatinine, Ser: 1.92 mg/dL — ABNORMAL HIGH (ref 0.44–1.00)
GFR calc Af Amer: 26 mL/min — ABNORMAL LOW (ref >60)
GFR calc non Af Amer: 23 mL/min — ABNORMAL LOW (ref >60)
Glucose, Bld: 84 mg/dL (ref 70–99)
Potassium: 4.5 mmol/L (ref 3.5–5.1)
Sodium: 136 mmol/L (ref 135–145)

## 2019-09-22 LAB — CBC
HCT: 26.2 % — ABNORMAL LOW (ref 36.0–46.0)
Hemoglobin: 8.2 g/dL — ABNORMAL LOW (ref 12.0–15.0)
MCH: 27.7 pg (ref 26.0–34.0)
MCHC: 31.3 g/dL (ref 30.0–36.0)
MCV: 88.5 fL (ref 80.0–100.0)
Platelets: 173 10*3/uL (ref 150–400)
RBC: 2.96 MIL/uL — ABNORMAL LOW (ref 3.87–5.11)
RDW: 19.8 % — ABNORMAL HIGH (ref 11.5–15.5)
WBC: 4.8 10*3/uL (ref 4.0–10.5)
nRBC: 0 % (ref 0.0–0.2)

## 2019-09-22 MED ORDER — FUROSEMIDE 10 MG/ML IJ SOLN
40.0000 mg | Freq: Two times a day (BID) | INTRAMUSCULAR | Status: DC
  Administered 2019-09-22 – 2019-09-24 (×5): 40 mg via INTRAVENOUS
  Filled 2019-09-22 (×5): qty 4

## 2019-09-22 MED ORDER — LIDOCAINE HCL 1 % IJ SOLN
INTRAMUSCULAR | Status: DC | PRN
  Administered 2019-09-22: 10 mL

## 2019-09-22 MED ORDER — LIDOCAINE HCL 1 % IJ SOLN
INTRAMUSCULAR | Status: AC
  Filled 2019-09-22: qty 20

## 2019-09-22 NOTE — Progress Notes (Signed)
Subjective:  Patient seen at bedside this morning.  Patient denies difficulty breathing, states she feels well.  Patient states she uses oxygen at home and is on 3 L nasal cannula.  Plan of care discussed with patient, all questions answered.   Objective:    Vital Signs (last 24 hours): Vitals:   09/21/19 1919 09/21/19 2200 09/22/19 0100 09/22/19 0355  BP: (!) 142/52   (!) 145/57  Pulse: 66   61  Resp: 20 20 18 19   Temp: 98.8 F (37.1 C)   98.6 F (37 C)  TempSrc: Oral   Oral  SpO2: 92%   92%  Weight:    49.4 kg  Height:        Physical Exam: General Alert and answers questions appropriately, no acute distress  Cardiac Regular rate and rhythm, no murmurs, rubs, or gallops  Extremities No peripheral edema   CBC Latest Ref Rng & Units 09/22/2019 09/21/2019 09/20/2019  WBC 4.0 - 10.5 K/uL 4.8 3.8(L) 4.9  Hemoglobin 12.0 - 15.0 g/dL 8.2(L) 8.5(L) 8.5(L)  Hematocrit 36.0 - 46.0 % 26.2(L) 27.6(L) 26.4(L)  Platelets 150 - 400 K/uL 173 172 165   BMP Latest Ref Rng & Units 09/22/2019 09/21/2019 09/20/2019  Glucose 70 - 99 mg/dL 84 91 83  BUN 8 - 23 mg/dL 55(H) 55(H) 53(H)  Creatinine 0.44 - 1.00 mg/dL 1.92(H) 1.95(H) 2.05(H)  BUN/Creat Ratio 12 - 28 - - -  Sodium 135 - 145 mmol/L 136 136 138  Potassium 3.5 - 5.1 mmol/L 4.5 4.2 4.5  Chloride 98 - 111 mmol/L 94(L) 92(L) 95(L)  CO2 22 - 32 mmol/L 34(H) 36(H) 33(H)  Calcium 8.9 - 10.3 mg/dL 8.0(L) 8.4(L) 8.1(L)    Assessment/Plan:   Principal Problem:   Acute on chronic heart failure with preserved ejection fraction (HFpEF) (HCC) Active Problems:   Essential hypertension   Iron deficiency anemia   Pleural effusion, bilateral   Acute respiratory failure with hypoxia (HCC)   Atrial fibrillation [facility])   Patient is an 85 year old female with past medical history of hypertension, hyperlipidemia, CKD stage IIIb, and HFpEF here with acute hypoxic respiratory failure secondary to acute on chronic HFpEF.  #Acute hypoxic  respiratory failure: #Acute on chronic HFpEF: #Bilateral pleural effusions: Patient is status post 2 thoracentesis since admission.  Continue diuresis and monitoring volume status.  Patient with 1800 mL output since yesterday, received 20 mg torsemide twice yesterday. Weight of 49.4 kg from 50.3 kg day before. Was 53 kg on admission, weight at last discharge of 51.2 kg.  * We will continue with torsemide 20 mg twice daily for now. Patient will likely require more aggressive diuresis. We appreciate cardiology recommendations * Patient is having increased oxygen requirement, 6L [place] to maintain saturation of 90%. No respiratory distress. CXR demonstrates significant bilateral pleural effusions, with slight increase on right side since 12/18.  # Acute on chronic kidney disease: Baseline creatinine approximately 1.7 in July 2020.  Creatinine of 2.46 on admission, improved with diuresis to 1.8 on 12/19.  Creatinine of 1.92 today. *We will continue to follow  # Arrhythmia: Per cardiology review of ECG, irregular rhythm was detected, but not consistent with atrial fibrillation, patient in sinus rhythm.  # HTN: Continue amlodipine 10 mg daily + continue to hold home hydralazine  # Iron deficiency anemia: Hemoglobin of 8.2 today, stable, will continue to monitor. *Continue iron and vitamin B12 supplementation *Received 1 dose of Feraheme on 12/14 and second dose on 12/22  # Nutrition:  * BMI ~  18, consulted RD * Continue ensure, magic cup  Diet: Heart DVT Ppx:  Dispo: Anticipated discharge pending clinical improvement  Jeanmarie Hubert, MD 09/22/2019, 6:20 AM Pager: [phone]

## 2019-09-22 NOTE — Telephone Encounter (Signed)
New message   Per Robbie Lis scheduled a TOC visit with HAO Meng on 10/05/19 at 10:00 am.

## 2019-09-22 NOTE — Progress Notes (Signed)
Internal Medicine Attending Note:  I have seen and evaluated this patient and I have discussed the plan of care with the house staff. Please see their note for complete details. I concur with their findings.  Patient noted to have worsening hypoxia this morning on rounds, requiring 5L [place] to maintain oxygen > 90%. Repeat CXR obtained and shows persistent / worsening of her pleural effusions. She is currently on torsemide 20 mg BID. Renal function is stable near her baseline (Scr 1.5-1.8). Doubt she would benefit from another thoracentesis at this point given her re accumulation x 2 now. Appreciate cardiology recommendations regarding her diuretics. She will likely need a more aggressive regimen.   Velna Ochs, MD 09/22/2019, 11:50 AM

## 2019-09-22 NOTE — Progress Notes (Signed)
Progress Note  Patient Name: Gina [name] Date of Encounter: 09/22/2019  Primary Cardiologist:   No primary care provider on file.   Subjective   Called back to see the patient as she was requiring more O2.  She was apparently requiring 5 liters though currently on 3 liters.  She denies any SOB and is in no distress but I am not sure she can recount symptoms accurately.    Inpatient Medications    Scheduled Meds: . sodium chloride   Intravenous Once  . amLODipine  10 mg Oral Daily  . aspirin EC  81 mg Oral Daily  . atorvastatin  20 mg Oral q1800  . calcium carbonate  500 mg of elemental calcium Oral Daily  . feeding supplement (ENSURE ENLIVE)  237 mL Oral BID BM  . ferrous sulfate  325 mg Oral Q breakfast  . latanoprost  1 drop Both Eyes QHS  . levothyroxine  112 mcg Oral Q0600  . linaclotide  145 mcg Oral QAC breakfast  . mirtazapine  7.5 mg Oral QHS  . sodium chloride flush  3 mL Intravenous Once  . torsemide  20 mg Oral BID  . vitamin B-12  500 mcg Oral Daily  . vitamin E  400 Units Oral Daily   Continuous Infusions:  PRN Meds: albuterol   Vital Signs    Vitals:   09/21/19 2200 09/22/19 0100 09/22/19 0355 09/22/19 0746  BP:   (!) 145/57 (!) 157/46  Pulse:   61 66  Resp: 20 18 19 18   Temp:   98.6 F (37 C) 98.4 F (36.9 C)  TempSrc:   Oral Oral  SpO2:   92% 91%  Weight:   49.4 kg   Height:        Intake/Output Summary (Last 24 hours) at 09/22/2019 1505 Last data filed at 09/22/2019 1142 Gross per 24 hour  Intake --  Output 1050 ml  Net -1050 ml   Filed Weights   09/20/19 0434 09/21/19 0412 09/22/19 0355  Weight: 50.8 kg 50.3 kg 49.4 kg    Telemetry    NA - Personally Reviewed  ECG    NA - Personally Reviewed  Physical Exam   GEN: No acute distress.  Frail appearing Neck:   Positive JVD Cardiac: RRR, distant heart sounds Respiratory:    Decreased breath sounds bilaterally.  GI: Soft, nontender, non-distended  MS: No edema; No  deformity. Neuro:  Nonfocal  Psych:   Pleasant.  Appropriate but probably slightly confused.   Labs    Chemistry Recent Labs  Lab 09/16/19 0329 09/17/19 1000 09/20/19 0336 09/21/19 0331 09/22/19 0241  NA 138 137 138 136 136  K 4.0 3.8 4.5 4.2 4.5  CL 104 98 95* 92* 94*  CO2 25 28 33* 36* 34*  GLUCOSE 88 117* 83 91 84  BUN 46* 46* 53* 55* 55*  CREATININE 2.41* 2.46* 2.05* 1.95* 1.92*  CALCIUM 8.2* 8.4* 8.1* 8.4* 8.0*  PROT 5.4* 5.9*  --   --   --   ALBUMIN 2.7* 2.9*  --   --   --   AST 64* 40  --   --   --   ALT 16 17  --   --   --   ALKPHOS 43 45  --   --   --   BILITOT 0.4 0.5  --   --   --   GFRNONAA 17* 17* 21* 22* 23*  GFRAA 20* 20* 24* 26* 26*  ANIONGAP 9 11 10 8 8      Hematology Recent Labs  Lab 09/20/19 0336 09/21/19 0331 09/22/19 0241  WBC 4.9 3.8* 4.8  RBC 3.02* 3.12* 2.96*  HGB 8.5* 8.5* 8.2*  HCT 26.4* 27.6* 26.2*  MCV 87.4 88.5 88.5  MCH 28.1 27.2 27.7  MCHC 32.2 30.8 31.3  RDW 18.6* 19.3* 19.8*  PLT 165 172 173    Cardiac EnzymesNo results for input(s): TROPONINI in the last 168 hours. No results for input(s): TROPIPOC in the last 168 hours.   BNPNo results for input(s): BNP, PROBNP in the last 168 hours.   DDimer No results for input(s): DDIMER in the last 168 hours.   Radiology    DG Chest 2 View  Result Date: 09/22/2019 CLINICAL DATA:  Pleural effusions. Hypoxia. EXAM: CHEST - 2 VIEW COMPARISON:  09/17/2019 FINDINGS: There are large bilateral pleural effusions, slightly increased on the right and stable on the left. The interstitial pulmonary edema noted on the prior study has resolved. Pulmonary vascularity is normal. Aortic atherosclerosis. Cardiac silhouette appears normal in size. IMPRESSION: 1. Persistent large bilateral pleural effusions, slightly increased on the right. 2. Resolved interstitial pulmonary edema. 3.  Aortic Atherosclerosis (ICD10-I70.0). Electronically Signed   By: Lorriane Shire M.D.   On: 09/22/2019 10:11     Cardiac Studies   Echo  1. Left ventricular ejection fraction, by visual estimation, is 60 to 65%. The left ventricle has normal function. There is moderately increased left ventricular hypertrophy. 2. Elevated left atrial and left ventricular end-diastolic pressures. 3. Left ventricular diastolic parameters are consistent with Grade I diastolic dysfunction (impaired relaxation). 4. The left ventricle has no regional wall motion abnormalities. 5. Global right ventricle has normal systolic function.The right ventricular size is normal. No increase in right ventricular wall thickness. 6. Left atrial size was moderately dilated. 7. Right atrial size was normal. 8. The mitral valve is abnormal. Mild mitral valve regurgitation. 9. The tricuspid valve is grossly normal. Tricuspid valve regurgitation is mild. 10. The aortic valve is tricuspid. Aortic valve regurgitation is not visualized. 11. The pulmonic valve was grossly normal. Pulmonic valve regurgitation is trivial. 12. The inferior vena cava is normal in size with greater than 50% respiratory variability, suggesting right atrial pressure of 3 mmHg. 13. Large pleural effusion in both left and right lateral regions. 14. Trivial pericardial effusion is present.  Patient Profile     85 y.o. female with a hx of PVD followed by vascular (s/pbilateral femoral-popliteal bypass graftand left peroneal artery angioplasty), HTN, iron deficiency anemia, HLD, CKD III-IV, DM, thyroid disease and breast cancer s/p R mastectomywho is being seen  for the evaluation ofCHFat the request of Dr. Court Joy.   Assessment & Plan    Acute diastolic HF:  The patient has re accumulation of pleural effusions.  These were severe on presentation and partially resolved after the initial thoracentesis x 2.  She has significant residual and now re accumulation of what appears to be a transudative effusion likely secondary to diastolic dysfunction.  I discussed this  with Dr. Benjamine Mola and I would suggest that she have repeat bilateral thoracentesis per IR and today resume Torsemide IV reassissing in the AM.  Likely she will need to be on higher dose Torsemide at discharge as her creatinine allows.    For questions or updates, please contact [place] Please consult [email or web address] for contact info under Cardiology/STEMI.   Signed, Minus Breeding, MD  09/22/2019, 3:05 PM

## 2019-09-22 NOTE — Procedures (Signed)
PROCEDURE SUMMARY:  Successful US guided right thoracentesis. Yielded 700 mL of yellow fluid. Pt tolerated procedure well. No immediate complications.  Specimen was not sent for labs. CXR ordered.  EBL < 5 mL  Docia Barrier PA-C 09/22/2019 5:18 PM

## 2019-09-22 NOTE — Telephone Encounter (Signed)
Patient is currently still in the hospital

## 2019-09-23 LAB — BASIC METABOLIC PANEL
Anion gap: 10 (ref 5–15)
BUN: 56 mg/dL — ABNORMAL HIGH (ref 8–23)
CO2: 35 mmol/L — ABNORMAL HIGH (ref 22–32)
Calcium: 8.2 mg/dL — ABNORMAL LOW (ref 8.9–10.3)
Chloride: 93 mmol/L — ABNORMAL LOW (ref 98–111)
Creatinine, Ser: 1.87 mg/dL — ABNORMAL HIGH (ref 0.44–1.00)
GFR calc Af Amer: 27 mL/min — ABNORMAL LOW (ref >60)
GFR calc non Af Amer: 24 mL/min — ABNORMAL LOW (ref >60)
Glucose, Bld: 75 mg/dL (ref 70–99)
Potassium: 4.3 mmol/L (ref 3.5–5.1)
Sodium: 138 mmol/L (ref 135–145)

## 2019-09-23 LAB — CBC
HCT: 26.3 % — ABNORMAL LOW (ref 36.0–46.0)
Hemoglobin: 8.5 g/dL — ABNORMAL LOW (ref 12.0–15.0)
MCH: 28.1 pg (ref 26.0–34.0)
MCHC: 32.3 g/dL (ref 30.0–36.0)
MCV: 87.1 fL (ref 80.0–100.0)
Platelets: 190 10*3/uL (ref 150–400)
RBC: 3.02 MIL/uL — ABNORMAL LOW (ref 3.87–5.11)
RDW: 20.2 % — ABNORMAL HIGH (ref 11.5–15.5)
WBC: 5 10*3/uL (ref 4.0–10.5)
nRBC: 0 % (ref 0.0–0.2)

## 2019-09-23 NOTE — Telephone Encounter (Signed)
Currently admitted.

## 2019-09-23 NOTE — Progress Notes (Signed)
Subjective:  Patient seen at bedside this morning.  Patient states that she feels well, denies shortness of breath.  Plan of care discussed with patient, all questions answered.   Objective:   Vital Signs (last 24 hours): Vitals:   09/22/19 1702 09/22/19 1753 09/22/19 2039 09/23/19 0601  BP: (!) 145/60 125/65 135/65 (!) 149/53  Pulse:  72 68 61  Resp:  (!) 22 16 18   Temp:  (!) 97.3 F (36.3 C) 98.3 F (36.8 C) 98.1 F (36.7 C)  TempSrc:  Oral Oral Oral  SpO2: 96% 98% 92% 95%  Weight:    48.6 kg  Height:        Physical Exam: General Alert and answers questions appropriately, no acute distress  Cardiac Regular rate and rhythm, no murmurs, rubs, or gallops  Pulmonary Breathing comfortably on 3L [place]. No wheezes, rhonchi, or rales.   BMP Latest Ref Rng & Units 09/23/2019 09/22/2019 09/21/2019  Glucose 70 - 99 mg/dL 75 84 91  BUN 8 - 23 mg/dL 56(H) 55(H) 55(H)  Creatinine 0.44 - 1.00 mg/dL 1.87(H) 1.92(H) 1.95(H)  BUN/Creat Ratio 12 - 28 - - -  Sodium 135 - 145 mmol/L 138 136 136  Potassium 3.5 - 5.1 mmol/L 4.3 4.5 4.2  Chloride 98 - 111 mmol/L 93(L) 94(L) 92(L)  CO2 22 - 32 mmol/L 35(H) 34(H) 36(H)  Calcium 8.9 - 10.3 mg/dL 8.2(L) 8.0(L) 8.4(L)   CBC Latest Ref Rng & Units 09/23/2019 09/22/2019 09/21/2019  WBC 4.0 - 10.5 K/uL 5.0 4.8 3.8(L)  Hemoglobin 12.0 - 15.0 g/dL 8.5(L) 8.2(L) 8.5(L)  Hematocrit 36.0 - 46.0 % 26.3(L) 26.2(L) 27.6(L)  Platelets 150 - 400 K/uL 190 173 172    Assessment/Plan:   Principal Problem:   Acute on chronic heart failure with preserved ejection fraction (HFpEF) (HCC) Active Problems:   Essential hypertension   Iron deficiency anemia   Pleural effusion, bilateral   Acute respiratory failure with hypoxia (HCC)   Atrial fibrillation [facility])  Patient is an 85 year old female with past medical history of hypertension, hyperlipidemia, CKD stage IIIb, and HFpEF here with acute hypoxic respiratory failure secondary to acute on chronic HFpEF.  #  Acute hypoxic respiratory failure: # Acute on chronic HFpEF: # Bilateral pleural effusions: Patient is status post 2 thoracenteses since admission.  Patient with net output of approximately 1 L since yesterday at 7 AM.  Patient was switched yesterday from torsemide 20 mg twice daily to Lasix IV 40 mg twice daily (received IV lasix x1 yesterday).  Patient had right-sided thoracentesis by IR yesterday draining 700 ml fluid.  Per patient, plan is for left-sided drainage tomorrow morning.  This morning, patient was breathing comfortably on 3 L nasal cannula saturating at approximately 95%.  Weight of 48.6 kg this morning, down from 53 kg on admission, 49.4 kg yesterday. *Continue Lasix IV 40 mg twice daily, we appreciate cardiology's recommendations  # Acute on chronic kidney disease: Plan creatinine of approximately 1.7 in July 2020.  Creatinine of 2.46 on admission, improved with diuresis.  Creatinine of 1.87 today, from 1.92 yesterday.  # Arrhythmia: Per cardiology review of ECG, irregular rhythm was detected, but not consistent with atrial fibrillation  # HTN: Continue amlodipine 10 mg daily + continue to hold home hydralazine  # Iron deficiency anemia: Hemoglobin of 8.5 today, stable from 8.2 yesterday. *Continue iron and vitamin B12 supplementation *Received 1 dose of Feraheme on 12/14 and second dose on 12/22  Diet: Heart DVT Ppx: SCDs Dispo: Anticipated discharge pending clinical  improvement  Jeanmarie Hubert, MD 09/23/2019, 9:14 AM Pager: [phone]

## 2019-09-23 NOTE — Progress Notes (Signed)
Physical Therapy Treatment Patient Details Name: Gina [name] MRN: [id number] DOB: 29-Mar-1929 Today's Date: 09/23/2019    History of Present Illness Pt is an 85 year old female with a history of HFpEF, DM, HTN, PVD, HLD, CKD IIIb, anemia, and breast carcinoma s/p mastectomy, who presents 09/12/19 with SOB, edema, orthopnea, paroxysmal nocturnal dyspnea, and decreased appetite. Worked up for CHF exacerbation. Recurrent bilateral pleural effusions, s/p thoracentesis 12/14, 12/16, 12/23.   PT Comments    Pt with increased fatigue this session. Able to tolerate some seated and standing activity, requiring prolonged seated rest breaks between activity bouts. SpO2 down to 85% on RA, maintaining >/90% on 2L O2.    Follow Up Recommendations  Home health PT;Supervision/Assistance - 24 hour     Equipment Recommendations  Rolling walker with 5" wheels;3in1 (PT);Wheelchair (measurements PT);Wheelchair cushion (measurements PT)    Recommendations for Other Services       Precautions / Restrictions Precautions Precautions: Fall Precaution Comments: Baseline 2L O2 PRN Restrictions Weight Bearing Restrictions: No    Mobility  Bed Mobility Overal bed mobility: Modified Independent Bed Mobility: Supine to Sit;Sit to Supine           General bed mobility comments: Increased time and effort, no physical assist required. Prolonged time resting once sitting EOB due to fatigue with transfer  Transfers Overall transfer level: Needs assistance Equipment used: Rolling walker (2 wheeled) Transfers: Sit to/from Stand Sit to Stand: Min guard;Min assist;Mod assist         General transfer comment: Multiple sit<>stands from EOB to RW, pt initially requiring min guard, progressing to min-modA on subsequent trials, pt very fatigued requiring prolong seated rest between standing bouts  Ambulation/Gait             General Gait Details: Pt declined secondary to fatigue   Stairs              Wheelchair Mobility    Modified Rankin (Stroke Patients Only)       Balance Overall balance assessment: Needs assistance Sitting-balance support: No upper extremity supported;Feet supported Sitting balance-Leahy Scale: Good Sitting balance - Comments: Multiple times leaning elbows on knees, no LOB   Standing balance support: Bilateral upper extremity supported;During functional activity Standing balance-Leahy Scale: Poor Standing balance comment: Reliant on at least single UE support while pt performing posterior pericare                            Cognition Arousal/Alertness: Awake/alert Behavior During Therapy: Flat affect Overall Cognitive Status: Impaired/Different from baseline Area of Impairment: Problem solving                             Problem Solving: Slow processing;Requires verbal cues General Comments: pt soft spoken, clarified report with son, pt at baseline level of cognition. Increased fatigue this session likely affecting apparent cognitive deficits      Exercises      General Comments General comments (skin integrity, edema, etc.): SpO2 down to 85% on RA, maintaining >/90% on 2L O2      Pertinent Vitals/Pain Pain Assessment: Faces Faces Pain Scale: Hurts a little bit Pain Location: Generalized Pain Descriptors / Indicators: Discomfort;Tiring Pain Intervention(s): Monitored during session    Home Living                      Prior Function  PT Goals (current goals can now be found in the care plan section) Progress towards PT goals: Not progressing toward goals - comment(limited by fatigue)    Frequency    Min 3X/week      PT Plan Current plan remains appropriate    Co-evaluation              AM-PAC PT "6 Clicks" Mobility   Outcome Measure  Help needed turning from your back to your side while in a flat bed without using bedrails?: None Help needed moving from lying on your  back to sitting on the side of a flat bed without using bedrails?: None Help needed moving to and from a bed to a chair (including a wheelchair)?: A Little Help needed standing up from a chair using your arms (e.g., wheelchair or bedside chair)?: A Little Help needed to walk in hospital room?: A Little Help needed climbing 3-5 steps with a railing? : A Lot 6 Click Score: 19    End of Session Equipment Utilized During Treatment: Oxygen Activity Tolerance: Patient limited by fatigue Patient left: in bed;with call bell/phone within reach;with bed alarm set Nurse Communication: Mobility status PT Visit Diagnosis: Unsteadiness on feet (R26.81);Muscle weakness (generalized) (M62.81);Difficulty in walking, not elsewhere classified (R26.2)     Time: 1660-6004 PT Time Calculation (min) (ACUTE ONLY): 31 min  Charges:  $Therapeutic Activity: 23-37 mins                    Gina [name], PT, DPT Acute Rehabilitation Services  Pager [phone] Office [phone]  Derry Lory 09/23/2019, 12:47 PM

## 2019-09-23 NOTE — Progress Notes (Addendum)
Progress Note  Patient Name: Gina [name] Date of Encounter: 09/23/2019  Primary Cardiologist: Minus Breeding, MD - New this admission  Subjective   Gina [name] is resting comfortably in dark room. She is not wanting to wake up and say much. She says that she is breathing better today.   Inpatient Medications    Scheduled Meds: . sodium chloride   Intravenous Once  . amLODipine  10 mg Oral Daily  . aspirin EC  81 mg Oral Daily  . atorvastatin  20 mg Oral q1800  . calcium carbonate  500 mg of elemental calcium Oral Daily  . feeding supplement (ENSURE ENLIVE)  237 mL Oral BID BM  . ferrous sulfate  325 mg Oral Q breakfast  . furosemide  40 mg Intravenous BID  . latanoprost  1 drop Both Eyes QHS  . levothyroxine  112 mcg Oral Q0600  . linaclotide  145 mcg Oral QAC breakfast  . mirtazapine  7.5 mg Oral QHS  . sodium chloride flush  3 mL Intravenous Once  . vitamin B-12  500 mcg Oral Daily  . vitamin E  400 Units Oral Daily   Continuous Infusions:  PRN Meds: albuterol, lidocaine   Vital Signs    Vitals:   09/22/19 1702 09/22/19 1753 09/22/19 2039 09/23/19 0601  BP: (!) 145/60 125/65 135/65 (!) 149/53  Pulse:  72 68 61  Resp:  (!) 22 16 18   Temp:  (!) 97.3 F (36.3 C) 98.3 F (36.8 C) 98.1 F (36.7 C)  TempSrc:  Oral Oral Oral  SpO2: 96% 98% 92% 95%  Weight:    48.6 kg  Height:        Intake/Output Summary (Last 24 hours) at 09/23/2019 0746 Last data filed at 09/23/2019 9417 Gross per 24 hour  Intake 360 ml  Output 470 ml  Net -110 ml   Last 3 Weights 09/23/2019 09/22/2019 09/21/2019  Weight (lbs) 107 lb 2.3 oz 108 lb 14.5 oz 110 lb 14.3 oz  Weight (kg) 48.6 kg 49.4 kg 50.3 kg      Telemetry    Sinus rhythm in the 60's with PAC's and PVCs - Personally Reviewed  ECG    No new tracings for review  Physical Exam   GEN: Thin, frail, elderly female. No acute distress.   Neck: + JVD Cardiac: RRR, no murmurs, rubs, or gallops.  Respiratory:  Decreased breath sounds. GI: Soft, nontender, non-distended  MS: No edema; No deformity. Neuro:  Nonfocal  Psych: Normal affect   Labs    High Sensitivity Troponin:   Recent Labs  Lab 09/12/19 1527 09/12/19 1900  TROPONINIHS 40* 43*      Chemistry Recent Labs  Lab 09/17/19 1000 09/21/19 0331 09/22/19 0241 09/23/19 0235  NA 137 136 136 138  K 3.8 4.2 4.5 4.3  CL 98 92* 94* 93*  CO2 28 36* 34* 35*  GLUCOSE 117* 91 84 75  BUN 46* 55* 55* 56*  CREATININE 2.46* 1.95* 1.92* 1.87*  CALCIUM 8.4* 8.4* 8.0* 8.2*  PROT 5.9*  --   --   --   ALBUMIN 2.9*  --   --   --   AST 40  --   --   --   ALT 17  --   --   --   ALKPHOS 45  --   --   --   BILITOT 0.5  --   --   --   GFRNONAA 17* 22* 23* 24*  GFRAA  20* 26* 26* 27*  ANIONGAP 11 8 8 10      Hematology Recent Labs  Lab 09/21/19 0331 09/22/19 0241 09/23/19 0235  WBC 3.8* 4.8 5.0  RBC 3.12* 2.96* 3.02*  HGB 8.5* 8.2* 8.5*  HCT 27.6* 26.2* 26.3*  MCV 88.5 88.5 87.1  MCH 27.2 27.7 28.1  MCHC 30.8 31.3 32.3  RDW 19.3* 19.8* 20.2*  PLT 172 173 190    BNPNo results for input(s): BNP, PROBNP in the last 168 hours.   DDimer No results for input(s): DDIMER in the last 168 hours.   Radiology    DG Chest 1 View  Result Date: 09/22/2019 CLINICAL DATA:  Post thoracentesis on the right. EXAM: CHEST  1 VIEW COMPARISON:  Radiographs 09/22/2019 and 09/17/2019. FINDINGS: 1710 hours. The right pleural effusion is slightly smaller. There are persistent small to moderate right and large left pleural effusions. The right basilar aeration has mildly improved. No pneumothorax. The heart size and mediastinal contours are stable. Aortic and great vessel atherosclerosis noted. IMPRESSION: No evidence of pneumothorax following right thoracentesis. Persistent bilateral pleural effusions, left greater than right. Mildly improved right basilar aeration. Electronically Signed   By: Richardean Sale M.D.   On: 09/22/2019 17:42   DG Chest 2  View  Result Date: 09/22/2019 CLINICAL DATA:  Pleural effusions. Hypoxia. EXAM: CHEST - 2 VIEW COMPARISON:  09/17/2019 FINDINGS: There are large bilateral pleural effusions, slightly increased on the right and stable on the left. The interstitial pulmonary edema noted on the prior study has resolved. Pulmonary vascularity is normal. Aortic atherosclerosis. Cardiac silhouette appears normal in size. IMPRESSION: 1. Persistent large bilateral pleural effusions, slightly increased on the right. 2. Resolved interstitial pulmonary edema. 3.  Aortic Atherosclerosis (ICD10-I70.0). Electronically Signed   By: Lorriane Shire M.D.   On: 09/22/2019 10:11   IR THORACENTESIS ASP PLEURAL SPACE W/IMG GUIDE  Result Date: 09/22/2019 INDICATION: Patient with history of chronic heart failure and recurrent pleural effusions. Request is made for therapeutic right thoracentesis. EXAM: ULTRASOUND GUIDED THERAPEUTIC RIGHT THORACENTESIS MEDICATIONS: 10 mL 1% lidocaine COMPLICATIONS: None immediate. PROCEDURE: An ultrasound guided thoracentesis was thoroughly discussed with the patient and questions answered. The benefits, risks, alternatives and complications were also discussed. The patient understands and wishes to proceed with the procedure. Written consent was obtained. Ultrasound was performed to localize and mark an adequate pocket of fluid in the right chest. The area was then prepped and draped in the normal sterile fashion. 1% Lidocaine was used for local anesthesia. Under ultrasound guidance a 6 Fr Safe-T-Centesis catheter was introduced. Thoracentesis was performed. The catheter was removed and a dressing applied. FINDINGS: A total of approximately 700 mL of yellow fluid was removed. IMPRESSION: Successful ultrasound guided therapeutic right thoracentesis yielding 700 mL of pleural fluid. Read by: Brynda Greathouse PA-C Electronically Signed   By: Lucrezia Europe M.D.   On: 09/22/2019 17:21    Cardiac Studies   Echocardiogram  09/16/2019 IMPRESSIONS   1. Left ventricular ejection fraction, by visual estimation, is 60 to 65%. The left ventricle has normal function. There is moderately increased left ventricular hypertrophy.  2. Elevated left atrial and left ventricular end-diastolic pressures.  3. Left ventricular diastolic parameters are consistent with Grade I diastolic dysfunction (impaired relaxation).  4. The left ventricle has no regional wall motion abnormalities.  5. Global right ventricle has normal systolic function.The right ventricular size is normal. No increase in right ventricular wall thickness.  6. Left atrial size was moderately dilated.  7. Right  atrial size was normal.  8. The mitral valve is abnormal. Mild mitral valve regurgitation.  9. The tricuspid valve is grossly normal. Tricuspid valve regurgitation is mild. 10. The aortic valve is tricuspid. Aortic valve regurgitation is not visualized. 11. The pulmonic valve was grossly normal. Pulmonic valve regurgitation is trivial. 12. The inferior vena cava is normal in size with greater than 50% respiratory variability, suggesting right atrial pressure of 3 mmHg. 13. Large pleural effusion in both left and right lateral regions. 14. Trivial pericardial effusion is present.  Patient Profile     85 y.o. female with a hx of PVD followed by vascular (s/p bilateral femoral-popliteal bypass graft and left peroneal artery angioplasty), HTN, iron deficiency anemia, HLD, CKD III-IV, DM, thyroid disease and breast cancer s/p R mastectomy who is being seen for evaluation of CHF. Large pleural effusions.   Assessment & Plan    Acute diastolic CHF/Acute hypoxic resp failure -Echocardiogram showed EF 60-65%, elevated LA and LV EDP. Grade 1 DD.  -Pt with large bilateral pleural effusions. S/P thoracentesis X3 (last yesterday).  -Initially diuresed with lasix 120 mg IV BID with good UOP and improved renal function. Lasix switched to Torsemide 20 mg BID. Pt had  re accumulation of pleural fluid with increased oxygen demand. Switched back to laisx 40 mg BID.  -Repeat right thoracentesis done yesterday evening with removal 700 mL.  -Max wt 121 lbs. Down to 107 lbs today. Not much UOP documented for the last 24 hours. She is net negative 5.4L since admission.  -Pt reports improvement in breathing. No edema.  -Unclear if low UOP represents bottom of fluid status or need for increase in diuretic. Urine appears concentrated. She continues to need diuresis for recurrent pleural effusions.   Arrhythmia -Admit EKG on 12/13 "undetermined rhythm" and artifacts. Did not correlate with telemetry which showed sinus rhythm with PAC's. No documented afib. -Continues in SR with PACs and PVCs, no afib on monitor.   Acute on CKD stage III-IV -Baseline SCr looks to be ~1.6. SCr on admission 2.12, up to 2.46 and now appears to be stable at 1.87.  Hypertension -BP normal to mildly elevated.    For questions or updates, please contact [place] Please consult [email or web address] for contact info under   Signed, Daune Perch, NP  09/23/2019, 7:46 AM    History and all data above reviewed.  Patient examined.  I agree with the findings as above.   She seems to be doing OK today.  Always somnolent but answers questions.  She has no complaints with me this morning. The patient exam reveals COR:RRR  ,  Lungs: Decreased breath sounds at both bases  ,  Abd: Positive bowel sounds, no rebound no guarding, Ext No edema  .  All available labs, radiology testing, previous records reviewed. Agree with documented assessment and plan.   Thoracentesis as above with another planned tomorrow.  Tolerating increased diuresis and would go home with torsemide.  She will be tenuous and frail at home and goals of therapy should be discussed.    Lulie Hurd  1:10 PM  09/23/2019

## 2019-09-23 NOTE — Progress Notes (Signed)
PT Cancellation Note  Patient Details Name: Gina [name] MRN: [id number] DOB: 1929/05/13   Cancelled Treatment:    Reason Eval/Treat Not Completed: Fatigue/lethargy limiting ability to participate. Pt lethargic attempting to take medications with RN. Will follow-up for PT treatment as schedule permits when pt better able to participate.   Mabeline Caras, PT, DPT Acute Rehabilitation Services  Pager [phone] Office [place] 09/23/2019, 9:13 AM

## 2019-09-24 LAB — BASIC METABOLIC PANEL
Anion gap: 10 (ref 5–15)
BUN: 58 mg/dL — ABNORMAL HIGH (ref 8–23)
CO2: 32 mmol/L (ref 22–32)
Calcium: 8 mg/dL — ABNORMAL LOW (ref 8.9–10.3)
Chloride: 95 mmol/L — ABNORMAL LOW (ref 98–111)
Creatinine, Ser: 1.95 mg/dL — ABNORMAL HIGH (ref 0.44–1.00)
GFR calc Af Amer: 26 mL/min — ABNORMAL LOW (ref >60)
GFR calc non Af Amer: 22 mL/min — ABNORMAL LOW (ref >60)
Glucose, Bld: 149 mg/dL — ABNORMAL HIGH (ref 70–99)
Potassium: 5.5 mmol/L — ABNORMAL HIGH (ref 3.5–5.1)
Sodium: 137 mmol/L (ref 135–145)

## 2019-09-24 LAB — CBC
HCT: 26.5 % — ABNORMAL LOW (ref 36.0–46.0)
Hemoglobin: 8.2 g/dL — ABNORMAL LOW (ref 12.0–15.0)
MCH: 27.8 pg (ref 26.0–34.0)
MCHC: 30.9 g/dL (ref 30.0–36.0)
MCV: 89.8 fL (ref 80.0–100.0)
Platelets: 204 10*3/uL (ref 150–400)
RBC: 2.95 MIL/uL — ABNORMAL LOW (ref 3.87–5.11)
RDW: 20.5 % — ABNORMAL HIGH (ref 11.5–15.5)
WBC: 4.9 10*3/uL (ref 4.0–10.5)
nRBC: 0 % (ref 0.0–0.2)

## 2019-09-24 NOTE — Progress Notes (Signed)
Internal Medicine Attending Note:  I have seen and evaluated this patient and I have discussed the plan of care with the house staff. Please see their note for complete details. I concur with their findings.  Patient appears euvolemic on exam. Her main issue is these recurrent bilateral pleural effusion. She is s/p repeat right thoracentesis (700 cc yellow fluid) on 12/23. Plan for left thoracentesis today or tomorrow per IR, which will be her fourth thoracentesis overall this admission. Afterwards, patient should be ready for discharge. Appreciate cardiology's assistance with diuretics.  Velna Ochs, MD 09/24/2019, 11:08 AM

## 2019-09-24 NOTE — Progress Notes (Signed)
Progress Note  Patient Name: Gina [name] Date of Encounter: 09/24/2019  Primary Cardiologist: Minus Breeding, MD  Subjective   Not very talkative; no interest of patient to have a conversation  Inpatient Medications    Scheduled Meds: . sodium chloride   Intravenous Once  . amLODipine  10 mg Oral Daily  . aspirin EC  81 mg Oral Daily  . atorvastatin  20 mg Oral q1800  . calcium carbonate  500 mg of elemental calcium Oral Daily  . feeding supplement (ENSURE ENLIVE)  237 mL Oral BID BM  . ferrous sulfate  325 mg Oral Q breakfast  . furosemide  40 mg Intravenous BID  . latanoprost  1 drop Both Eyes QHS  . levothyroxine  112 mcg Oral Q0600  . linaclotide  145 mcg Oral QAC breakfast  . mirtazapine  7.5 mg Oral QHS  . sodium chloride flush  3 mL Intravenous Once  . vitamin B-12  500 mcg Oral Daily  . vitamin E  400 Units Oral Daily   Continuous Infusions:  PRN Meds: albuterol, lidocaine   Vital Signs    Vitals:   09/23/19 0925 09/23/19 1200 09/23/19 1943 09/24/19 0400  BP: (!) 144/58 (!) 141/71 (!) 119/94 (!) 137/96  Pulse:  65 65 61  Resp:  14 15 19   Temp:  98 F (36.7 C) 98.1 F (36.7 C) 98.3 F (36.8 C)  TempSrc:  Oral Oral Oral  SpO2:  96% 95% 93%  Weight:    48.8 kg  Height:        Intake/Output Summary (Last 24 hours) at 09/24/2019 0830 Last data filed at 09/24/2019 0440 Gross per 24 hour  Intake --  Output 500 ml  Net -500 ml    I/O since admission: -5938  Filed Weights   09/22/19 0355 09/23/19 0601 09/24/19 0400  Weight: 49.4 kg 48.6 kg 48.8 kg    Telemetry    Sinus  - Personally Reviewed  ECG    09/16/2019 last ECG (independently read by me):Sinus at 68 with T wave inversion inferolaterally  Physical Exam   BP (!) 137/96 (BP Location: Right Arm)   Pulse 61   Temp 98.3 F (36.8 C) (Oral)   Resp 19   Ht 5\' 5"  (1.651 m)   Wt 48.8 kg   SpO2 93%   BMI 17.90 kg/m  General: thin, frail Skin: normal turgor, no rashes, warm  and dry HEENT: Normocephalic, atraumatic. Pupils equal round and reactive to light; sclera anicteric; extraocular muscles intact;  Nose without nasal septal hypertrophy Mouth/Parynx benign;  Neck: No JVD, no carotid bruits; normal carotid upstroke Lungs: decreased BS Chest wall: without tenderness to palpitation Heart: PMI not displaced, RRR, s1 s2 normal, 1/6 systolic murmur, no diastolic murmur, no rubs, gallops, thrills, or heaves Abdomen: soft, nontender; no hepatosplenomehaly, BS+; abdominal aorta nontender and not dilated by palpation. Back: no CVA tenderness Pulses 2+ Extremities: no clubbing cyanosis or edema, Homan's sign negative  Neurologic: grossly nonfocal; Cranial nerves grossly wnl Psychologic: flat affect  Labs    Chemistry Recent Labs  Lab 09/17/19 1000 09/21/19 0331 09/22/19 0241 09/23/19 0235  NA 137 136 136 138  K 3.8 4.2 4.5 4.3  CL 98 92* 94* 93*  CO2 28 36* 34* 35*  GLUCOSE 117* 91 84 75  BUN 46* 55* 55* 56*  CREATININE 2.46* 1.95* 1.92* 1.87*  CALCIUM 8.4* 8.4* 8.0* 8.2*  PROT 5.9*  --   --   --   ALBUMIN  2.9*  --   --   --   AST 40  --   --   --   ALT 17  --   --   --   ALKPHOS 45  --   --   --   BILITOT 0.5  --   --   --   GFRNONAA 17* 22* 23* 24*  GFRAA 20* 26* 26* 27*  ANIONGAP 11 8 8 10      Hematology Recent Labs  Lab 09/21/19 0331 09/22/19 0241 09/23/19 0235  WBC 3.8* 4.8 5.0  RBC 3.12* 2.96* 3.02*  HGB 8.5* 8.2* 8.5*  HCT 27.6* 26.2* 26.3*  MCV 88.5 88.5 87.1  MCH 27.2 27.7 28.1  MCHC 30.8 31.3 32.3  RDW 19.3* 19.8* 20.2*  PLT 172 173 190   HS Trop: 40 >> 43  Cardiac EnzymesNo results for input(s): TROPONINI in the last 168 hours. No results for input(s): TROPIPOC in the last 168 hours.   BNPNo results for input(s): BNP, PROBNP in the last 168 hours.   DDimer No results for input(s): DDIMER in the last 168 hours.   Lipid Panel     Component Value Date/Time   CHOL 140 08/19/2019 1428   TRIG 83 08/19/2019 1428   HDL  72 08/19/2019 1428   CHOLHDL 1.9 08/19/2019 1428   CHOLHDL 2.3 03/27/2019 0511   VLDL 11 03/27/2019 0511   LDLCALC 52 08/19/2019 1428     Radiology    DG Chest 1 View  Result Date: 09/22/2019 CLINICAL DATA:  Post thoracentesis on the right. EXAM: CHEST  1 VIEW COMPARISON:  Radiographs 09/22/2019 and 09/17/2019. FINDINGS: 1710 hours. The right pleural effusion is slightly smaller. There are persistent small to moderate right and large left pleural effusions. The right basilar aeration has mildly improved. No pneumothorax. The heart size and mediastinal contours are stable. Aortic and great vessel atherosclerosis noted. IMPRESSION: No evidence of pneumothorax following right thoracentesis. Persistent bilateral pleural effusions, left greater than right. Mildly improved right basilar aeration. Electronically Signed   By: Richardean Sale M.D.   On: 09/22/2019 17:42   DG Chest 2 View  Result Date: 09/22/2019 CLINICAL DATA:  Pleural effusions. Hypoxia. EXAM: CHEST - 2 VIEW COMPARISON:  09/17/2019 FINDINGS: There are large bilateral pleural effusions, slightly increased on the right and stable on the left. The interstitial pulmonary edema noted on the prior study has resolved. Pulmonary vascularity is normal. Aortic atherosclerosis. Cardiac silhouette appears normal in size. IMPRESSION: 1. Persistent large bilateral pleural effusions, slightly increased on the right. 2. Resolved interstitial pulmonary edema. 3.  Aortic Atherosclerosis (ICD10-I70.0). Electronically Signed   By: Lorriane Shire M.D.   On: 09/22/2019 10:11   IR THORACENTESIS ASP PLEURAL SPACE W/IMG GUIDE  Result Date: 09/22/2019 INDICATION: Patient with history of chronic heart failure and recurrent pleural effusions. Request is made for therapeutic right thoracentesis. EXAM: ULTRASOUND GUIDED THERAPEUTIC RIGHT THORACENTESIS MEDICATIONS: 10 mL 1% lidocaine COMPLICATIONS: None immediate. PROCEDURE: An ultrasound guided thoracentesis was  thoroughly discussed with the patient and questions answered. The benefits, risks, alternatives and complications were also discussed. The patient understands and wishes to proceed with the procedure. Written consent was obtained. Ultrasound was performed to localize and mark an adequate pocket of fluid in the right chest. The area was then prepped and draped in the normal sterile fashion. 1% Lidocaine was used for local anesthesia. Under ultrasound guidance a 6 Fr Safe-T-Centesis catheter was introduced. Thoracentesis was performed. The catheter was removed and a dressing applied.  FINDINGS: A total of approximately 700 mL of yellow fluid was removed. IMPRESSION: Successful ultrasound guided therapeutic right thoracentesis yielding 700 mL of pleural fluid. Read by: Brynda Greathouse PA-C Electronically Signed   By: Lucrezia Europe M.D.   On: 09/22/2019 17:21    Cardiac Studies   Echocardiogram 09/16/2019 IMPRESSIONS  1. Left ventricular ejection fraction, by visual estimation, is 60 to 65%. The left ventricle has normal function. There is moderately increased left ventricular hypertrophy. 2. Elevated left atrial and left ventricular end-diastolic pressures. 3. Left ventricular diastolic parameters are consistent with Grade I diastolic dysfunction (impaired relaxation). 4. The left ventricle has no regional wall motion abnormalities. 5. Global right ventricle has normal systolic function.The right ventricular size is normal. No increase in right ventricular wall thickness. 6. Left atrial size was moderately dilated. 7. Right atrial size was normal. 8. The mitral valve is abnormal. Mild mitral valve regurgitation. 9. The tricuspid valve is grossly normal. Tricuspid valve regurgitation is mild. 10. The aortic valve is tricuspid. Aortic valve regurgitation is not visualized. 11. The pulmonic valve was grossly normal. Pulmonic valve regurgitation is trivial. 12. The inferior vena cava is normal in  size with greater than 50% respiratory variability, suggesting right atrial pressure of 3 mmHg. 13. Large pleural effusion in both left and right lateral regions. 14. Trivial pericardial effusion is present.  Patient Profile     85 y.o. female with a hx of PVD followed by vascular (s/pbilateral femoral-popliteal bypass graftand left peroneal artery angioplasty), HTN, iron deficiency anemia, HLD, CKD III-IV, DM, thyroid disease and breast cancer s/p R mastectomywho is being seen for evaluation ofCHF. Large pleural effusions.   Assessment & Plan    1.  Acute diastolic CHF/acute hypoxic respiratory failure: Normal EF on echo at 6065% with elevated LA and LVEDP and grade 1 diastolic dysfunction.  She is status post thoracentesis for pleural effusions.  I/0 -5938 since admission.  2.  Frequent PACs:  improved  3. AKI: Creatinine 2.46 >> 1.87; now back on furosemide  4. HTN: continues to have diastolic hypertension on amlodipine 10 mg and furosemide 40 mg.  5. Anemia: h/h 8.5/26.3  Signed, Troy Sine, MD, [facility] 09/24/2019, 8:30 AM

## 2019-09-24 NOTE — Progress Notes (Signed)
  Subjective:  Patient resting comfortably in bed, denies complaint.   Objective:    Vital Signs (last 24 hours): Vitals:   09/23/19 0925 09/23/19 1200 09/23/19 1943 09/24/19 0400  BP: (!) 144/58 (!) 141/71 (!) 119/94 (!) 137/96  Pulse:  65 65 61  Resp:  14 15 19   Temp:  98 F (36.7 C) 98.1 F (36.7 C) 98.3 F (36.8 C)  TempSrc:  Oral Oral Oral  SpO2:  96% 95% 93%  Weight:    48.8 kg  Height:        Physical Exam: General Alert and answers questions appropriately, no acute distress  Cardiac Regular rate and rhythm, no murmurs, rubs, or gallops  Pulmonary Clear to auscultation bilaterally without wheezes, rhonchi, or rales    Assessment/Plan:   Principal Problem:   Acute on chronic heart failure with preserved ejection fraction (HFpEF) (HCC) Active Problems:   Essential hypertension   Iron deficiency anemia   Pleural effusion, bilateral   Acute respiratory failure with hypoxia (HCC)   Atrial fibrillation [facility])   Patient is an 85 year old female with past medical history of hypertension, hyperlipidemia, CKD stage IIIb, and HFpEF here with acute hypoxic respiratory failure secondary to acute on chronic HFpEF.  #Acute hypoxic respiratory failure: #Acute on chronic HFpEF: #Bilateral pleural effusions: Patient is status post 3 thoracentesis since admission, last on 09/22/2019.  Patient was switched on 12/23 from torsemide 20 mg p.o. twice daily to Lasix IV to 40 mg twice daily.  Patient with net output of 650 mL since 7 AM yesterday. Creatinine of 1.87 yesterday, labs from today are pending *Plan for left-sided thoracentesis by IR *Weight of 48.8 kg today from 48.6 kg yesterday. Was 53 kg on admission, weight on last discharge of 51.2 kg *This morning patient is breathing comfortably/saturating well on 2L [place]  # Acute on chronic kidney disease: Baseline creatinine of approximately 1.7 in July 2020. Creatinine of 2.46 on admission, improved with diuresis. Creatinine of 1.87  yesterday, BMP pending  # Arrhythmia: Per cardiology review of ECG, irregular rhythm was detected, but not consistent with atrial fibrillation.  # HTN: Continue amlodipine 10 mg daily + continue to hold home hydralazine  # Iron deficiency anemia: Hemoglobin stable, no signs of active bleeding, CBC from today pending *Continue iron and vitamin B12 supplementation *Received 1 dose of Feraheme on 12/14 and second dose on 12/22  Diet: Heart DVT Ppx: SCDs Dispo: Anticipated discharge pending clinical improvement  Jeanmarie Hubert, MD 09/24/2019, 6:01 AM Pager: [phone]

## 2019-09-25 ENCOUNTER — Inpatient Hospital Stay (HOSPITAL_COMMUNITY): Payer: Medicare Other

## 2019-09-25 DIAGNOSIS — I10 Essential (primary) hypertension: Secondary | ICD-10-CM

## 2019-09-25 LAB — CBC
HCT: 25.7 % — ABNORMAL LOW (ref 36.0–46.0)
Hemoglobin: 8.1 g/dL — ABNORMAL LOW (ref 12.0–15.0)
MCH: 28.2 pg (ref 26.0–34.0)
MCHC: 31.5 g/dL (ref 30.0–36.0)
MCV: 89.5 fL (ref 80.0–100.0)
Platelets: 190 10*3/uL (ref 150–400)
RBC: 2.87 MIL/uL — ABNORMAL LOW (ref 3.87–5.11)
RDW: 21.1 % — ABNORMAL HIGH (ref 11.5–15.5)
WBC: 4.8 10*3/uL (ref 4.0–10.5)
nRBC: 0 % (ref 0.0–0.2)

## 2019-09-25 LAB — BASIC METABOLIC PANEL
Anion gap: 9 (ref 5–15)
BUN: 55 mg/dL — ABNORMAL HIGH (ref 8–23)
CO2: 35 mmol/L — ABNORMAL HIGH (ref 22–32)
Calcium: 7.8 mg/dL — ABNORMAL LOW (ref 8.9–10.3)
Chloride: 96 mmol/L — ABNORMAL LOW (ref 98–111)
Creatinine, Ser: 1.82 mg/dL — ABNORMAL HIGH (ref 0.44–1.00)
GFR calc Af Amer: 28 mL/min — ABNORMAL LOW (ref >60)
GFR calc non Af Amer: 24 mL/min — ABNORMAL LOW (ref >60)
Glucose, Bld: 91 mg/dL (ref 70–99)
Potassium: 4.3 mmol/L (ref 3.5–5.1)
Sodium: 140 mmol/L (ref 135–145)

## 2019-09-25 MED ORDER — FUROSEMIDE 80 MG PO TABS: 80.0000 mg | ORAL_TABLET | Freq: Every day | ORAL | Status: DC

## 2019-09-25 MED ORDER — FUROSEMIDE 80 MG PO TABS
80.0000 mg | ORAL_TABLET | Freq: Every day | ORAL | Status: DC
  Administered 2019-09-25 – 2019-09-26 (×2): 80 mg via ORAL
  Filled 2019-09-25: qty 1

## 2019-09-25 NOTE — Progress Notes (Signed)
Progress Note  Patient Name: Gina [name] Date of Encounter: 09/25/2019  Primary Cardiologist: Minus Breeding, MD   Subjective   Not particular interactive, but appears to be resting comfortably. Over 7 L net diuresis.  Creatinine stable at approximately 1.8 (appears to be her baseline.). Judging by documented weights earlier in the year "dry weight" appears to be in the 102-110 pound range.  She weighs approximately 108 pounds today (14 pounds less than a maximum weight on this admission).  Inpatient Medications    Scheduled Meds: . sodium chloride   Intravenous Once  . amLODipine  10 mg Oral Daily  . aspirin EC  81 mg Oral Daily  . atorvastatin  20 mg Oral q1800  . calcium carbonate  500 mg of elemental calcium Oral Daily  . feeding supplement (ENSURE ENLIVE)  237 mL Oral BID BM  . ferrous sulfate  325 mg Oral Q breakfast  . furosemide  40 mg Intravenous BID  . latanoprost  1 drop Both Eyes QHS  . levothyroxine  112 mcg Oral Q0600  . linaclotide  145 mcg Oral QAC breakfast  . mirtazapine  7.5 mg Oral QHS  . sodium chloride flush  3 mL Intravenous Once  . vitamin B-12  500 mcg Oral Daily  . vitamin E  400 Units Oral Daily   Continuous Infusions:  PRN Meds: albuterol, lidocaine   Vital Signs    Vitals:   09/24/19 2008 09/25/19 0000 09/25/19 0327 09/25/19 0348  BP: (!) 141/50 132/63 131/68   Pulse: 64 (!) 55 (!) 55   Resp: 19 19 19    Temp: 99.2 F (37.3 C)  98.8 F (37.1 C)   TempSrc: Oral  Oral   SpO2: 95% 96% 95%   Weight:    48.9 kg  Height:        Intake/Output Summary (Last 24 hours) at 09/25/2019 1000 Last data filed at 09/25/2019 2841 Gross per 24 hour  Intake 280 ml  Output 1500 ml  Net -1220 ml   Last 3 Weights 09/25/2019 09/24/2019 09/23/2019  Weight (lbs) 107 lb 12.9 oz 107 lb 9.4 oz 107 lb 2.3 oz  Weight (kg) 48.9 kg 48.8 kg 48.6 kg      Telemetry    Sinus rhythm- Personally Reviewed  ECG    Sinus rhythm with PVCs, ST segment  depression T wave inversion in the inferior and lateral leads- Personally Reviewed  Physical Exam  Appears comfortable GEN: No acute distress.   Neck: No JVD Cardiac: RRR, no murmurs, rubs, or gallops.  Respiratory: Clear to auscultation bilaterally. GI: Soft, nontender, non-distended  MS: No edema; No deformity. Neuro:  Nonfocal  Psych: Normal affect   Labs    High Sensitivity Troponin:   Recent Labs  Lab 09/12/19 1527 09/12/19 1900  TROPONINIHS 40* 43*      Chemistry Recent Labs  Lab 09/23/19 0235 09/24/19 1129 09/25/19 0353  NA 138 137 140  K 4.3 5.5* 4.3  CL 93* 95* 96*  CO2 35* 32 35*  GLUCOSE 75 149* 91  BUN 56* 58* 55*  CREATININE 1.87* 1.95* 1.82*  CALCIUM 8.2* 8.0* 7.8*  GFRNONAA 24* 22* 24*  GFRAA 27* 26* 28*  ANIONGAP 10 10 9      Hematology Recent Labs  Lab 09/23/19 0235 09/24/19 1129 09/25/19 0353  WBC 5.0 4.9 4.8  RBC 3.02* 2.95* 2.87*  HGB 8.5* 8.2* 8.1*  HCT 26.3* 26.5* 25.7*  MCV 87.1 89.8 89.5  MCH 28.1 27.8 28.2  MCHC 32.3 30.9 31.5  RDW 20.2* 20.5* 21.1*  PLT 190 204 190    BNPNo results for input(s): BNP, PROBNP in the last 168 hours.   DDimer No results for input(s): DDIMER in the last 168 hours.   Radiology    No results found.  Cardiac Studies   Echocardiogram 09/16/2019    1. Left ventricular ejection fraction, by visual estimation, is 60 to 65%. The left ventricle has normal function. There is moderately increased left ventricular hypertrophy.  2. Elevated left atrial and left ventricular end-diastolic pressures.  3. Left ventricular diastolic parameters are consistent with Grade I diastolic dysfunction (impaired relaxation).  4. The left ventricle has no regional wall motion abnormalities.  5. Global right ventricle has normal systolic function.The right ventricular size is normal. No increase in right ventricular wall thickness.  6. Left atrial size was moderately dilated.  7. Right atrial size was normal.  8.  The mitral valve is abnormal. Mild mitral valve regurgitation.  9. The tricuspid valve is grossly normal. Tricuspid valve regurgitation is mild. 10. The aortic valve is tricuspid. Aortic valve regurgitation is not visualized. 11. The pulmonic valve was grossly normal. Pulmonic valve regurgitation is trivial. 12. The inferior vena cava is normal in size with greater than 50% respiratory variability, suggesting right atrial pressure of 3 mmHg. 13. Large pleural effusion in both left and right lateral regions. 14. Trivial pericardial effusion is present  NOTE: On my review of the echo,  diastolic dysfunction is more severe (grade 2) clearly consistent with pseudonormal pattern, elevated left atrial filling pressure  Patient Profile     85 y.o. female  with hypertension, CKD stage IIIb, iron deficiency anemia, with acute on chronic heart failure with preserved ejection fraction, pulmonary edema with acute hypoxic respiratory failure.  Assessment & Plan    1. CHF: Markedly improved.  She has had 3 thoracentesis procedures and net diuresis of over 7 L.  Appears to be close to "dry weight".  I think we can switch to oral diuretics.  Would implement a program of "weight-based sliding scale" with diuretic therapy, with increase in diuretic dose triggered by a weight of 110 pounds or higher. 2. HTN: Adequate control. 3. CKD 3b: stable, at baseline.     For questions or updates, please contact [place] Please consult [email or web address] for contact info under        Signed, Sanda Klein, MD  09/25/2019, 10:00 AM

## 2019-09-25 NOTE — Progress Notes (Signed)
Internal Medicine Attending Note:  This patient's plan of care was discussed with the house staff. Please see their note for complete details. I concur with their findings.   Velna Ochs, MD 09/25/2019, 2:01 PM

## 2019-09-25 NOTE — Progress Notes (Signed)
  Subjective:  Patient resting comfortably in bed, no distress.   Objective:    Vital Signs (last 24 hours): Vitals:   09/24/19 2008 09/25/19 0000 09/25/19 0327 09/25/19 0348  BP: (!) 141/50 132/63 131/68   Pulse: 64 (!) 55 (!) 55   Resp: 19 19 19    Temp: 99.2 F (37.3 C)  98.8 F (37.1 C)   TempSrc: Oral  Oral   SpO2: 95% 96% 95%   Weight:    48.9 kg  Height:        Physical Exam: General Alert and answers questions appropriately, no acute distress  Cardiac Regular rate and rhythm, no murmurs, rubs, or gallops  Pulmonary Saturating 99% on 3L [place]. Clear to auscultation bilaterally without wheezes, rhonchi, or rales   BMP Latest Ref Rng & Units 09/25/2019 09/24/2019 09/23/2019  Glucose 70 - 99 mg/dL 91 149(H) 75  BUN 8 - 23 mg/dL 55(H) 58(H) 56(H)  Creatinine 0.44 - 1.00 mg/dL 1.82(H) 1.95(H) 1.87(H)  BUN/Creat Ratio 12 - 28 - - -  Sodium 135 - 145 mmol/L 140 137 138  Potassium 3.5 - 5.1 mmol/L 4.3 5.5(H) 4.3  Chloride 98 - 111 mmol/L 96(L) 95(L) 93(L)  CO2 22 - 32 mmol/L 35(H) 32 35(H)  Calcium 8.9 - 10.3 mg/dL 7.8(L) 8.0(L) 8.2(L)   CBC Latest Ref Rng & Units 09/25/2019 09/24/2019 09/23/2019  WBC 4.0 - 10.5 K/uL 4.8 4.9 5.0  Hemoglobin 12.0 - 15.0 g/dL 8.1(L) 8.2(L) 8.5(L)  Hematocrit 36.0 - 46.0 % 25.7(L) 26.5(L) 26.3(L)  Platelets 150 - 400 K/uL 190 204 190    Assessment/Plan:   Principal Problem:   Acute on chronic heart failure with preserved ejection fraction (HFpEF) (HCC) Active Problems:   Essential hypertension   Iron deficiency anemia   Pleural effusion, bilateral   Acute respiratory failure with hypoxia (HCC)   Atrial fibrillation [facility])   Patient is an 85 year old female with past medical history of hypertension, hyperlipidemia, CKD stage IIIb, and HFpEF here with acute hypoxic respiratory failure secondary to acute on chronic HFpEF.  #Acute hypoxic respiratory failure: #Acute on chronic HFpEF: #Bilateral pleural effusions: Patient is status  post 3 thoracenteses since admission, last on 09/22/2019.  Was switched on 12/23 from 4 torsemide 20 mg p.o. twice daily to Lasix IV 40 mg twice daily.  Patient has a recorded net output of 1460 mL since yesterday at 7 AM.  No new weight today, was 48.9 kg yesterday, 53 kg on admission, weight on last discharge of 51.2 kg.  Creatinine of 1.82 today, from 1.95 yesterday. *Plan for left-sided thoracentesis by IR *This morning patient is breathing comfortably/saturating well on 3L [place]  #Acute on chronic kidney disease: Baseline creatinine approximately 1.7 in July 2020.  Creatinine of 2.46 on admission, improved with diuresis.  Creatinine of 1.82 today.  #Arrhythmia: Per cardiology review of ECG, regular rhythm was detected but not consistent with atrial fibrillation  #HTN: Continue amlodipine 10 mg daily + continue to hold home hydralazine  #Iron deficiency anemia: Hemoglobin is stable (8.1 today, 8.2 yesterday), no signs of active bleeding *Continue iron and vitamin B12 supplementation *Received 1 dose of Feraheme on 12/14 and second dose on 12/22  Diet: Heart DVT Ppx: SCDs Dispo: Anticipated discharge in approximately 2-3 days   Jeanmarie Hubert, MD 09/25/2019, 6:06 AM Pager: [phone]

## 2019-09-26 DIAGNOSIS — Z79890 Hormone replacement therapy (postmenopausal): Secondary | ICD-10-CM

## 2019-09-26 LAB — BASIC METABOLIC PANEL
Anion gap: 6 (ref 5–15)
BUN: 53 mg/dL — ABNORMAL HIGH (ref 8–23)
CO2: 37 mmol/L — ABNORMAL HIGH (ref 22–32)
Calcium: 8.2 mg/dL — ABNORMAL LOW (ref 8.9–10.3)
Chloride: 95 mmol/L — ABNORMAL LOW (ref 98–111)
Creatinine, Ser: 1.75 mg/dL — ABNORMAL HIGH (ref 0.44–1.00)
GFR calc Af Amer: 30 mL/min — ABNORMAL LOW (ref >60)
GFR calc non Af Amer: 26 mL/min — ABNORMAL LOW (ref >60)
Glucose, Bld: 94 mg/dL (ref 70–99)
Potassium: 4.5 mmol/L (ref 3.5–5.1)
Sodium: 138 mmol/L (ref 135–145)

## 2019-09-26 LAB — CBC
HCT: 25.3 % — ABNORMAL LOW (ref 36.0–46.0)
Hemoglobin: 7.9 g/dL — ABNORMAL LOW (ref 12.0–15.0)
MCH: 27.9 pg (ref 26.0–34.0)
MCHC: 31.2 g/dL (ref 30.0–36.0)
MCV: 89.4 fL (ref 80.0–100.0)
Platelets: 221 10*3/uL (ref 150–400)
RBC: 2.83 MIL/uL — ABNORMAL LOW (ref 3.87–5.11)
RDW: 21.1 % — ABNORMAL HIGH (ref 11.5–15.5)
WBC: 4.5 10*3/uL (ref 4.0–10.5)
nRBC: 0 % (ref 0.0–0.2)

## 2019-09-26 MED ORDER — FUROSEMIDE 80 MG PO TABS: 80.0000 mg | ORAL_TABLET | Freq: Two times a day (BID) | ORAL | 0 refills | Status: DC

## 2019-09-26 MED ORDER — FUROSEMIDE 80 MG PO TABS
80.0000 mg | ORAL_TABLET | Freq: Two times a day (BID) | ORAL | Status: DC
  Filled 2019-09-26 (×2): qty 1

## 2019-09-26 NOTE — TOC Transition Note (Signed)
Transition of Care [facility]) - CM/SW Discharge Note   Patient Details  Name: Gina [name] MRN: [id number] Date of Birth: 02/19/1929  Transition of [facility]) CM/SW Contact:  Claudie Leach, RN 09/26/2019, 3:11 PM   Clinical Narrative:    See previous TOC note.  [name] also declined patient.  [facility] is able to accept patient referral.  Niece is aware.    Patient has a RW and 3n1.  Per niece, they would like a WC.  WC ordered and delivered to room.  Patient refused delivery stating to [facility] staff and to me that she doesn't need a WC and doesn't want it.  RN advised.    Final next level of care: [place] Barriers to Discharge: No Barriers Identified   Patient Goals and CMS Choice Patient states their goals for this hospitalization and ongoing recovery are:: to walk down the street CMS Medicare.gov Compare Post Acute Care list provided to:: Patient Represenative (must comment)(Neice)     Discharge Plan and Services                DME Arranged: Patient refused services         HH Arranged: PT, OT [place] Agency: [place] ([facility]) Date [place]: 09/26/19 Time [place]: 1410 Representative spoke with at [place]: [place] ([place]) Interventions     Readmission Risk Interventions Readmission Risk Prevention Plan 04/20/2019 04/15/2019  Transportation Screening Complete Complete  PCP or Specialist Appt within 3-5 Days Complete -  HRI or Home Care Consult Complete -  Social Work Consult for [place] Planning/Counseling Complete -  Palliative Care Screening Not Applicable -  Medication Review Press photographer) Complete -  HRI or [place] - Complete  Palliative Care Screening - Not [place] - Not Applicable  Some recent data might be hidden

## 2019-09-26 NOTE — Progress Notes (Signed)
Physical Therapy Note  Patient suffers from CHF and recurrent bilateral pleural effusions which impairs their ability to perform daily activities like walking, activity tolerance, dressing, bathing, and general mobility in the home.  A walker alone will not resolve the issues with performing activities of daily living. A lightweight wheelchair will allow patient to safely perform daily activities.  The patient can self propel in the home or has a caregiver who can provide assistance.     Roney Marion, [place]  Acute Rehabilitation Services Pager [phone] Office [phone]

## 2019-09-26 NOTE — Progress Notes (Signed)
Internal Medicine Attending Note:  I have seen and evaluated this patient and I have discussed the plan of care with the house staff. Please see their note for complete details. I concur with their findings.  Plan for discharge today on current regimen of lasix 80 mg BID. She will need close PCP follow up for her pleural effusions. She is back on her home oxygen of 3L [place].   Velna Ochs, MD 09/26/2019, 11:58 AM

## 2019-09-26 NOTE — Progress Notes (Signed)
Progress Note  Patient Name: Gina [name] Date of Encounter: 09/26/2019  Primary Cardiologist: Minus Breeding, MD   Subjective   Reports she is breathing well. Reasonable UO/net diuresis, but unexpected weight increase. On 3L 02 (her home dose). Creatinine has improved further.  Inpatient Medications    Scheduled Meds: . sodium chloride   Intravenous Once  . amLODipine  10 mg Oral Daily  . aspirin EC  81 mg Oral Daily  . atorvastatin  20 mg Oral q1800  . calcium carbonate  500 mg of elemental calcium Oral Daily  . feeding supplement (ENSURE ENLIVE)  237 mL Oral BID BM  . ferrous sulfate  325 mg Oral Q breakfast  . furosemide  80 mg Oral BID  . latanoprost  1 drop Both Eyes QHS  . levothyroxine  112 mcg Oral Q0600  . linaclotide  145 mcg Oral QAC breakfast  . mirtazapine  7.5 mg Oral QHS  . sodium chloride flush  3 mL Intravenous Once  . vitamin B-12  500 mcg Oral Daily  . vitamin E  400 Units Oral Daily   Continuous Infusions:  PRN Meds: albuterol, lidocaine   Vital Signs    Vitals:   09/25/19 0348 09/25/19 1343 09/26/19 0322 09/26/19 0900  BP:  (!) 133/55 139/76 (!) 150/45  Pulse:  (!) 54 (!) 55 66  Resp:  16 15 19   Temp:  97.9 F (36.6 C) 97.9 F (36.6 C) 98.2 F (36.8 C)  TempSrc:  Oral Oral Oral  SpO2:  100% 96% 99%  Weight: 48.9 kg  50.1 kg   Height:        Intake/Output Summary (Last 24 hours) at 09/26/2019 1024 Last data filed at 09/26/2019 0321 Gross per 24 hour  Intake 120 ml  Output 850 ml  Net -730 ml   Last 3 Weights 09/26/2019 09/25/2019 09/24/2019  Weight (lbs) 110 lb 8 oz 107 lb 12.9 oz 107 lb 9.4 oz  Weight (kg) 50.122 kg 48.9 kg 48.8 kg      Telemetry    NSR - Personally Reviewed  ECG    No new tracing  - Personally Reviewed  Physical Exam  Frail, comfortable GEN: No acute distress.   Neck: No JVD Cardiac: RRR, no murmurs, rubs, or gallops.  Respiratory: Clear to auscultation bilaterally. GI: Soft, nontender,  non-distended  MS: No edema; No deformity. Neuro:  Nonfocal  Psych: Normal affect   Labs    High Sensitivity Troponin:   Recent Labs  Lab 09/12/19 1527 09/12/19 1900  TROPONINIHS 40* 43*      Chemistry Recent Labs  Lab 09/24/19 1129 09/25/19 0353 09/26/19 0240  NA 137 140 138  K 5.5* 4.3 4.5  CL 95* 96* 95*  CO2 32 35* 37*  GLUCOSE 149* 91 94  BUN 58* 55* 53*  CREATININE 1.95* 1.82* 1.75*  CALCIUM 8.0* 7.8* 8.2*  GFRNONAA 22* 24* 26*  GFRAA 26* 28* 30*  ANIONGAP 10 9 6      Hematology Recent Labs  Lab 09/24/19 1129 09/25/19 0353 09/26/19 0240  WBC 4.9 4.8 4.5  RBC 2.95* 2.87* 2.83*  HGB 8.2* 8.1* 7.9*  HCT 26.5* 25.7* 25.3*  MCV 89.8 89.5 89.4  MCH 27.8 28.2 27.9  MCHC 30.9 31.5 31.2  RDW 20.5* 21.1* 21.1*  PLT 204 190 221    BNPNo results for input(s): BNP, PROBNP in the last 168 hours.   DDimer No results for input(s): DDIMER in the last 168 hours.   Radiology  DG CHEST PORT 1 VIEW  Result Date: 09/25/2019 CLINICAL DATA:  Bilateral pleural effusions. EXAM: PORTABLE CHEST 1 VIEW COMPARISON:  09/22/2019 and 09/17/2019 FINDINGS: There has been slight increase in the right pleural effusion. The left pleural effusion appears slightly smaller. Heart size and pulmonary vascularity remain within normal limits. Aortic atherosclerosis. IMPRESSION: 1. Slight increase in the right pleural effusion. 2. Slight decrease in the left pleural effusion. Electronically Signed   By: Lorriane Shire M.D.   On: 09/25/2019 15:47    Cardiac Studies   Echocardiogram 09/16/2019   1. Left ventricular ejection fraction, by visual estimation, is 60 to 65%. The left ventricle has normal function. There is moderately increased left ventricular hypertrophy. 2. Elevated left atrial and left ventricular end-diastolic pressures. 3. Left ventricular diastolic parameters are consistent with Grade I diastolic dysfunction (impaired relaxation). 4. The left ventricle has no  regional wall motion abnormalities. 5. Global right ventricle has normal systolic function.The right ventricular size is normal. No increase in right ventricular wall thickness. 6. Left atrial size was moderately dilated. 7. Right atrial size was normal. 8. The mitral valve is abnormal. Mild mitral valve regurgitation. 9. The tricuspid valve is grossly normal. Tricuspid valve regurgitation is mild. 10. The aortic valve is tricuspid. Aortic valve regurgitation is not visualized. 11. The pulmonic valve was grossly normal. Pulmonic valve regurgitation is trivial. 12. The inferior vena cava is normal in size with greater than 50% respiratory variability, suggesting right atrial pressure of 3 mmHg. 13. Large pleural effusion in both left and right lateral regions. 14. Trivial pericardial effusion is present  NOTE: On my review of the echo,  diastolic dysfunction is more severe (grade 2) clearly consistent with pseudonormal pattern, elevated left atrial filling pressure  Patient Profile     85 y.o. female with hypertension, CKD stage IIIb, iron deficiency anemia, with acute on chronic heart failure with preserved ejection fraction, pulmonary edema with acute hypoxic respiratory failure.  Assessment & Plan    1. CHF: Markedly improved.  She has had 3 thoracentesis procedures and net diuresis of over 8 L.  Weight above expected "dry weight of 102-107 lb and rising. Increase furosemide to 80 mg BID. Reassess clinically and check BMET in 1-2 weeks. 2. HTN: Adequate control. 3. CKD 3b: stable, at baseline, improved with diuresis.     [facility] will sign off.   Medication Recommendations:  Furosemide 80 mg BID. Other meds continue current dose. Other recommendations (labs, testing, etc):  BMET in 7-10 days Follow up as an outpatient:  F/u in 2 weeks. If she has a scale and BP machine at home, OK to schedule as a telemedicine visit.  For questions or updates, please contact [place] Please consult [email or web address] for contact info under        Signed, Sanda Klein, MD  09/26/2019, 10:24 AM

## 2019-09-26 NOTE — Progress Notes (Signed)
  Subjective:  Patient seen at bedside this morning.  Patient states she is doing well.   Objective:    Vital Signs (last 24 hours): Vitals:   09/25/19 0327 09/25/19 0348 09/25/19 1343 09/26/19 0322  BP: 131/68  (!) 133/55 139/76  Pulse: (!) 55  (!) 54 (!) 55  Resp: 19  16 15   Temp: 98.8 F (37.1 C)  97.9 F (36.6 C) 97.9 F (36.6 C)  TempSrc: Oral  Oral Oral  SpO2: 95%  100% 96%  Weight:  48.9 kg  50.1 kg  Height:        Physical Exam: General Alert and answers questions appropriately, no acute distress  Cardiac Regular rate and rhythm, no murmurs, rubs, or gallops  Pulmonary Clear to auscultation bilaterally without wheezes, rhonchi, or rales   CBC Latest Ref Rng & Units 09/26/2019 09/25/2019 09/24/2019  WBC 4.0 - 10.5 K/uL 4.5 4.8 4.9  Hemoglobin 12.0 - 15.0 g/dL 7.9(L) 8.1(L) 8.2(L)  Hematocrit 36.0 - 46.0 % 25.3(L) 25.7(L) 26.5(L)  Platelets 150 - 400 K/uL 221 190 204   BMP Latest Ref Rng & Units 09/26/2019 09/25/2019 09/24/2019  Glucose 70 - 99 mg/dL 94 91 149(H)  BUN 8 - 23 mg/dL 53(H) 55(H) 58(H)  Creatinine 0.44 - 1.00 mg/dL 1.75(H) 1.82(H) 1.95(H)  BUN/Creat Ratio 12 - 28 - - -  Sodium 135 - 145 mmol/L 138 140 137  Potassium 3.5 - 5.1 mmol/L 4.5 4.3 5.5(H)  Chloride 98 - 111 mmol/L 95(L) 96(L) 95(L)  CO2 22 - 32 mmol/L 37(H) 35(H) 32  Calcium 8.9 - 10.3 mg/dL 8.2(L) 7.8(L) 8.0(L)    Assessment/Plan:   Principal Problem:   Acute on chronic heart failure with preserved ejection fraction (HFpEF) (HCC) Active Problems:   Essential hypertension   Iron deficiency anemia   Pleural effusion, bilateral   Acute respiratory failure with hypoxia (HCC)   Atrial fibrillation [facility])  Patient is an 85 year old female with past medical history of hypertension, hyperlipidemia, CKD stage IIIb, and HFpEF here with acute hypoxic respiratory failure secondary to acute on chronic HFpEF.  #Acute hypoxic respiratory failure: #Acute on chronic HFpEF: #Bilateral pleural  effusions: Patient is status post 3 thoracenteses since admission, last on 09/22/2019 (right-sided).  Patient was switched on 12/26 from Lasix IV 40 mg twice daily to Lasix 80 mg PO once daily.  Net output of 870 mL recorded since yesterday at 7 AM. *Repeat chest x-ray (comparison from 12/23 was after right-sided thoracentesis), shows reaccumulation on right side, decreased left-sided pleural effusion. *This morning patient is breathing comfortably/saturating well on 2L [place] *Will discuss recommendations on home regimen with cardiology  # Acute on chronic kidney disease: Baseline creatinine approximately 1.7 in July 2020.  Creatinine of 2.46 on admission, improved with diuresis.  Creatinine of 1.75 this morning.  # HTN: Continue amlodipine 10 mg daily + continue to hold home hydralazine, blood pressure well controlled.  #Iron deficiency anemia: Hemoglobin is stable (7.9 today, 8.1 yesterday), no signs of active bleeding *Continue iron and vitamin B12 supplementation *Received 1 dose of Feraheme on 12/14 and second dose on 12/22  Diet: Heart DVT Ppx: SCDs Dispo: Anticipated discharge in approximately 0-2 days  Jeanmarie Hubert, MD 09/26/2019, 6:34 AM Pager: [phone]

## 2019-09-26 NOTE — Plan of Care (Signed)
DISCHARGE NOTE HOME JAYMIE MCKIDDY to be discharged home per MD order. Discussed prescriptions and follow up appointments with the patient. Prescriptions given to patient; medication list explained in detail. Patient verbalized understanding.  Skin clean, dry and intact without evidence of skin break down, no evidence of skin tears noted. IV catheter discontinued intact. Site without signs and symptoms of complications. Dressing and pressure applied. Pt denies pain at the site currently. No complaints noted.  Patient free of lines, drains, and wounds.   An After Visit Summary (AVS) was printed and given to the patient. Patient escorted via wheelchair, and discharged home via private auto.  Stephan Minister, RN

## 2019-09-27 ENCOUNTER — Telehealth: Payer: Self-pay

## 2019-09-27 NOTE — Telephone Encounter (Signed)
Transition Care Management Follow-up Telephone Call  Call completed with patient's niece, [name].  DPR on file to speak to [facility]. She provided a new phone number for patient # [phone] and Epic was updated.      Date of discharge and from where: 09/26/2019, [facility]   How have you been since you were released from the hospital? [name] said that her aunt is doing " pretty good but weak."  She just left her aunt's house.   Any questions or concerns? No questions/concerns reported at this time   Items Reviewed:  Did the pt receive and understand the discharge instructions provided? [name] said that they have the discharge instructions and medication list but she did not have them with her at the time of this call.  Medications obtained and verified?   [name] said  she knows that the patient is to stop taking hydralazine. She said that she has not been taking prednisone and does not know why that was even on the list.  She noted that [facility] will deliver the new medications tomorrow but he patient has everything else,.  The pharmacy  bubble packs the meds and that will include the new order for furosemide.  [name] was able to correctly verbalize the new dose of furosemide.  She said that the aide who is with the patient during the day will remind her to take her morning medications and [name] will remind her to take the evening meds.   Any new allergies since your discharge? None reported  Do you have support at home? She lives alone. Annette checks on her at home 1-2 days/week. Her son is a Administrator and is out of town a lot but will stay with her when he is in [place].   Other (ie: DME, Home Health, etc) - home health was ordered through [facility] but the patient has not been contacted yet.  This CM to call [facility] tomorrow as they may have the wrong phone number for patient.  She has a walker, cane and commode seat.  She also has O2 that  she uses at 3L continuously  Has life alert bracelet  Has PCS 2-3 hours/days x 7 days/week.   Functional Questionnaire: (I = Independent and D = Dependent) ADL's:independent. Niece and aide provide assistance as needed. Patient has a cane and a walker.  Niece provide verbal reminders to take evening medications.    Follow up appointments reviewed:    PCP Hospital f/u appt confirmed? Appointment scheduled for 10/04/2019 @ 0930 with Ms Oletta Lamas, [facility] f/u appt confirmed? Has cardiology appt 10/05/2019  Are transportation arrangements needed?annette provides transportation.   If their condition worsens, is the pt aware to call  their PCP or go to the ED? Her niece is aware.  She also said that the patient has a life alert bracelet  Was the patient provided with contact information for the PCP's office or ED? Her niece has the clinic phone number  Was the pt encouraged to call back with questions or concerns? Yes, her niece was instructed to do so.

## 2019-09-27 NOTE — Telephone Encounter (Signed)
Attempted to contact pt. Continue to receive a message saying number can not be completed.

## 2019-09-28 ENCOUNTER — Telehealth (INDEPENDENT_AMBULATORY_CARE_PROVIDER_SITE_OTHER): Payer: Self-pay | Admitting: Primary Care

## 2019-09-28 NOTE — Telephone Encounter (Signed)
Physical Therapist Amber called to request the following verbal orders: Physical Therapy -1 Week 1 -2 Week 3 one home nurse visit to aid patient manage medications accordingly. Please follow up  [phone] p

## 2019-09-28 NOTE — Telephone Encounter (Signed)
Returned call to [employer] and provided verbal orders for PT.

## 2019-09-28 NOTE — Telephone Encounter (Signed)
09/12/2019 - 09/26/2019 (14 days)  [place] on 10/05/19 at 10:00 am

## 2019-09-28 NOTE — Telephone Encounter (Signed)
Call placed to [place] , spoke to [place] and provided him with the patient's updated phone number. He noted that the patient has already been seen today as they did a drive by.

## 2019-09-29 NOTE — Telephone Encounter (Signed)
Unable to contact pt will discuss at upcoming appt. Left detailed message on VM

## 2019-10-04 ENCOUNTER — Other Ambulatory Visit: Payer: Self-pay

## 2019-10-04 ENCOUNTER — Ambulatory Visit (INDEPENDENT_AMBULATORY_CARE_PROVIDER_SITE_OTHER): Payer: Medicare Other | Admitting: Primary Care

## 2019-10-04 ENCOUNTER — Encounter (INDEPENDENT_AMBULATORY_CARE_PROVIDER_SITE_OTHER): Payer: Self-pay | Admitting: Primary Care

## 2019-10-04 VITALS — BP 163/69 | HR 78 | Temp 97.1°F | Ht 67.0 in | Wt 107.0 lb

## 2019-10-04 DIAGNOSIS — Z09 Encounter for follow-up examination after completed treatment for conditions other than malignant neoplasm: Secondary | ICD-10-CM | POA: Diagnosis not present

## 2019-10-04 DIAGNOSIS — I10 Essential (primary) hypertension: Secondary | ICD-10-CM

## 2019-10-04 DIAGNOSIS — D649 Anemia, unspecified: Secondary | ICD-10-CM | POA: Diagnosis not present

## 2019-10-04 DIAGNOSIS — E039 Hypothyroidism, unspecified: Secondary | ICD-10-CM

## 2019-10-04 DIAGNOSIS — Z135 Encounter for screening for eye and ear disorders: Secondary | ICD-10-CM | POA: Diagnosis not present

## 2019-10-04 NOTE — Patient Instructions (Signed)
Protein-Energy Malnutrition Protein-energy malnutrition is when a person does not eat enough protein, fat, and calories. When this happens over time, it can lead to severe loss of muscle tissue (muscle wasting). This condition also affects the body's defense system (immune system) and can lead to other health problems. What are the causes? This condition may be caused by:  Not eating enough protein, fat, or calories.  Having certain chronic medical conditions.  Eating too little. What increases the risk? The following factors may make you more likely to develop this condition:  Living in poverty.  Long-term hospitalization.  Alcohol or drug dependency. Addiction often leads to a lifestyle in which proper diet is ignored. Dependency can also hurt the metabolism and the body's ability to absorb nutrients.  Eating disorders, such as anorexia nervosa or bulimia.  Chewing or swallowing problems. People with these disorders may not eat enough.  Having certain conditions, such as: ? Inflammatory bowel disease. Inflammation of the intestines makes it difficult for the body to absorb nutrients. ? Cancer or AIDS. These diseases can cause a loss of appetite. ? Chronic heart failure. This interferes with how the body uses nutrients. ? Cystic fibrosis. This disease can make it difficult for the body to absorb nutrients.  Eating a diet that extremely restricts protein, fat, or calorie intake. What are the signs or symptoms? Symptoms of this condition include:  Fatigue.  Weakness.  Dizziness.  Fainting.  Weight loss.  Loss of muscle tone and muscle mass.  Poor immune response.  Lack of menstruation.  Poor memory.  Hair loss.  Skin changes. How is this diagnosed? This condition may be diagnosed based on:  Your medical and dietary history.  A physical exam. This may include a measurement of your body mass index (BMI).  Blood tests. How is this treated? This condition may  be managed with:  Nutrition therapy. This may include working with a diet and nutrition specialist (dietitian).  Treatment for underlying conditions. People with severe protein-energy malnutrition may need to be treated in a hospital. This may involve receiving nutrition and fluids through an IV. Follow these instructions at home:   Eat a balanced diet. In each meal, include at least one food that is high in protein. Foods that are high in protein include: ? Meat. ? Poultry. ? Fish. ? Eggs. ? Cheese. ? Milk. ? Beans. ? Nuts.  Eat nutrient-rich foods that are easy to swallow and digest, such as: ? Fruit and yogurt smoothies. ? Oatmeal with nut butter.  Try to eat six small meals each day instead of three large meals.  Take vitamin and protein supplements as told by your health care provider or dietitian.  Follow your health care provider's recommendations about exercise and activity.  Keep all follow-up visits as told by your health care provider. This is important. Contact a health care provider if you:  Have increased weakness or fatigue.  Faint.  Are a woman and you stop having your period (menstruating).  Have rapid hair loss.  Have unexpected weight loss.  Have diarrhea.  Have nausea and vomiting. Get help right away if you have:  Difficulty breathing.  Chest pain. Summary  Protein-energy malnutrition is when a person does not eat enough protein, fat, and calories.  Protein-energy malnutrition can lead to severe loss of muscle tissue (muscle wasting). This condition also affects the body's defense system (immune system) and can lead to other health problems.  Talk with your health care provider about treatment for this   condition. Effective treatment depends on the underlying cause of the malnutrition. This information is not intended to replace advice given to you by your health care provider. Make sure you discuss any questions you have with your health  care provider. Document Revised: 10/01/2017 Document Reviewed: 10/01/2017 Elsevier Patient Education  2020 [employer]  
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
## 2019-10-05 ENCOUNTER — Ambulatory Visit (INDEPENDENT_AMBULATORY_CARE_PROVIDER_SITE_OTHER): Payer: Medicare Other | Admitting: Physician Assistant

## 2019-10-05 ENCOUNTER — Encounter: Payer: Self-pay | Admitting: Physician Assistant

## 2019-10-05 VITALS — BP 143/57 | HR 67 | Ht 67.0 in | Wt 106.0 lb

## 2019-10-05 DIAGNOSIS — I739 Peripheral vascular disease, unspecified: Secondary | ICD-10-CM | POA: Diagnosis not present

## 2019-10-05 DIAGNOSIS — I5032 Chronic diastolic (congestive) heart failure: Secondary | ICD-10-CM

## 2019-10-05 DIAGNOSIS — N1831 Chronic kidney disease, stage 3a: Secondary | ICD-10-CM | POA: Diagnosis not present

## 2019-10-05 DIAGNOSIS — E785 Hyperlipidemia, unspecified: Secondary | ICD-10-CM

## 2019-10-05 DIAGNOSIS — I10 Essential (primary) hypertension: Secondary | ICD-10-CM

## 2019-10-05 DIAGNOSIS — J90 Pleural effusion, not elsewhere classified: Secondary | ICD-10-CM

## 2019-10-05 NOTE — Patient Instructions (Addendum)
Medication Instructions:  Your physician recommends that you continue on your current medications as directed. Please refer to the Current Medication list given to you today. *If you need a refill on your cardiac medications before your next appointment, please call your pharmacy*  Lab Work: Your physician recommends that you return for lab work in: TODAY-BMET If you have labs (blood work) drawn today and your tests are completely normal, you will receive your results only by: Marland Kitchen MyChart Message (if you have MyChart) OR . A paper copy in the mail If you have any lab test that is abnormal or we need to change your treatment, we will call you to review the results.  Testing/Procedures: A chest x-ray takes a picture of the organs and structures inside the chest, including the heart, lungs, and blood vessels. This test can show several things, including, whether the heart is enlarges; whether fluid is building up in the lungs; and whether pacemaker / defibrillator leads are still in place. PLEASE COMPLETE NEXT WEEK AT [facility]    Follow-Up: At [facility], you and your health needs are our priority.  As part of our continuing mission to provide you with exceptional heart care, we have created designated Provider Care Teams.  These Care Teams include your primary Cardiologist (physician) and Advanced Practice Providers (APPs -  Physician Assistants and Nurse Practitioners) who all work together to provide you with the care you need, when you need it.  Your next appointment:   1 month(s) Isaac Laud) 3 month(s) (Dr Percival Spanish)   The format for your next appointment:   In Person  Provider:   Almyra Deforest, [place] Minus Breeding, MD  Other Instructions  ONLY DRINK 32-48 oz's OF FLUIDS PER DAY   SALTY 6 HANDOUT GIVEN-work on a low sodium diet  CHECK YOUR WEIGHT DAILY
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
## 2019-10-06 LAB — BASIC METABOLIC PANEL
BUN/Creatinine Ratio: 27 (ref 12–28)
BUN: 53 mg/dL — ABNORMAL HIGH (ref 8–27)
CO2: 28 mmol/L (ref 20–29)
Calcium: 9.1 mg/dL (ref 8.7–10.3)
Chloride: 94 mmol/L — ABNORMAL LOW (ref 96–106)
Creatinine, Ser: 1.97 mg/dL — ABNORMAL HIGH (ref 0.57–1.00)
GFR calc Af Amer: 26 mL/min/{1.73_m2} — ABNORMAL LOW (ref >59)
GFR calc non Af Amer: 22 mL/min/{1.73_m2} — ABNORMAL LOW (ref >59)
Glucose: 85 mg/dL (ref 65–99)
Potassium: 4 mmol/L (ref 3.5–5.2)
Sodium: 140 mmol/L (ref 134–144)

## 2019-10-07 ENCOUNTER — Encounter: Payer: Self-pay | Admitting: Physician Assistant

## 2019-10-08 ENCOUNTER — Other Ambulatory Visit (INDEPENDENT_AMBULATORY_CARE_PROVIDER_SITE_OTHER): Payer: Self-pay | Admitting: Primary Care

## 2019-10-08 ENCOUNTER — Encounter (INDEPENDENT_AMBULATORY_CARE_PROVIDER_SITE_OTHER): Payer: Self-pay | Admitting: Primary Care

## 2019-10-08 ENCOUNTER — Other Ambulatory Visit (INDEPENDENT_AMBULATORY_CARE_PROVIDER_SITE_OTHER): Payer: Self-pay | Admitting: Family Medicine

## 2019-10-09 MED ORDER — LEVOTHYROXINE SODIUM 112 MCG PO TABS: 112.0000 ug | ORAL_TABLET | Freq: Every day | ORAL | 3 refills | Status: DC

## 2019-10-11 ENCOUNTER — Telehealth: Payer: Self-pay

## 2019-10-11 NOTE — Telephone Encounter (Addendum)
Left a voice message for the patient on both her home and mobile numbers to give our office a call back to discuss her recent lab results.  ----- Message from Almyra Deforest, [place] sent at 10/11/2019 10:30 AM EST ----- Kidney function and electrolyte stable, recheck BMET in 1-2 month

## 2019-10-12 ENCOUNTER — Ambulatory Visit
Admission: RE | Admit: 2019-10-12 | Discharge: 2019-10-12 | Disposition: A | Discharge status: Home / Self Care | Payer: Medicare Other | Source: Ambulatory Visit | Attending: Physician Assistant | Admitting: Physician Assistant

## 2019-10-12 ENCOUNTER — Other Ambulatory Visit: Payer: Self-pay

## 2019-10-12 DIAGNOSIS — J90 Pleural effusion, not elsewhere classified: Secondary | ICD-10-CM

## 2019-10-12 DIAGNOSIS — N1831 Chronic kidney disease, stage 3a: Secondary | ICD-10-CM

## 2019-10-15 ENCOUNTER — Other Ambulatory Visit: Payer: Self-pay | Admitting: Internal Medicine

## 2019-10-18 ENCOUNTER — Other Ambulatory Visit: Payer: Self-pay | Admitting: Internal Medicine

## 2019-10-18 ENCOUNTER — Telehealth: Payer: Self-pay

## 2019-10-18 NOTE — Telephone Encounter (Addendum)
Left a voice message for the patient to give our office a call back for her results.  ----- Message from [place], [place] sent at 10/15/2019  4:43 PM EST ----- Pleural effusion has decreased in size, there does appears to be a hazy area in the right middle lobe of lung, repeat 2 view CXR in 2-3 weeks to ensure this resolves.

## 2019-10-19 ENCOUNTER — Telehealth: Payer: Self-pay | Admitting: Cardiology

## 2019-10-19 NOTE — Telephone Encounter (Signed)
Pt c/o BP issue: STAT if pt c/o blurred vision, one-sided weakness or slurred speech  1. What are your last 5 BP readings?  124/38  2. Are you having any other symptoms (ex. Dizziness, headache, blurred vision, passed out)?   No  3. What is your BP issue? Donita from [facility] wanted to report the BP because the bottom number was low. The pt has a hx of the bottom number being low. She feel fine otherwise. IF there are any concerns please contact Donita at the number provided

## 2019-10-19 NOTE — Telephone Encounter (Signed)
Spoke with Donita to get additional readings 110/40, 118/30 twice (one with nurse visit once with PT), 102/60, 132/64. Per Donita patient feels no different. Will forward to Dr Percival Spanish for review

## 2019-10-19 NOTE — Telephone Encounter (Signed)
Left detailed message, ok per voicemail

## 2019-10-19 NOTE — Telephone Encounter (Signed)
If she has no presyncope or syncope then no change in therapy.  Question the accuracy of the diastolic reading. (record the diastolic sound when the sound becomes softer not when it disappears completely.)

## 2019-10-20 NOTE — Telephone Encounter (Signed)
Patient has had several appointments with [facility] since 2019.  [facility] pcp has been removed and replaced with one of the physicians.  Care has been transferred.

## 2019-10-21 ENCOUNTER — Telehealth (INDEPENDENT_AMBULATORY_CARE_PROVIDER_SITE_OTHER): Payer: Self-pay

## 2019-10-21 NOTE — Telephone Encounter (Signed)
Patient called to make a medication refill for   furosemide (LASIX) 80 MG tablet   Patient uses   [place], [place]  [address], [place] [address]   Please advice [phone]

## 2019-10-22 NOTE — Telephone Encounter (Signed)
FWD to PCP

## 2019-10-23 NOTE — Telephone Encounter (Signed)
All Bp medications needs to be filled by Dr. Gregary Signs cardiologist patient and [facility] /PT lasix is included with them refilling CHF

## 2019-10-25 ENCOUNTER — Other Ambulatory Visit (INDEPENDENT_AMBULATORY_CARE_PROVIDER_SITE_OTHER): Payer: Self-pay | Admitting: Primary Care

## 2019-10-26 ENCOUNTER — Other Ambulatory Visit: Payer: Self-pay

## 2019-10-26 ENCOUNTER — Encounter (HOSPITAL_COMMUNITY): Payer: Self-pay | Admitting: Emergency Medicine

## 2019-10-26 ENCOUNTER — Emergency Department (HOSPITAL_COMMUNITY): Payer: Medicare Other

## 2019-10-26 ENCOUNTER — Other Ambulatory Visit: Payer: Self-pay | Admitting: Cardiology

## 2019-10-26 ENCOUNTER — Emergency Department (HOSPITAL_COMMUNITY)
Admission: EM | Admit: 2019-10-26 | Discharge: 2019-10-26 | Disposition: A | Discharge status: Home / Self Care | Payer: Medicare Other | Attending: Emergency Medicine | Admitting: Emergency Medicine

## 2019-10-26 DIAGNOSIS — I130 Hypertensive heart and chronic kidney disease with heart failure and stage 1 through stage 4 chronic kidney disease, or unspecified chronic kidney disease: Secondary | ICD-10-CM | POA: Diagnosis not present

## 2019-10-26 DIAGNOSIS — Z87891 Personal history of nicotine dependence: Secondary | ICD-10-CM | POA: Insufficient documentation

## 2019-10-26 DIAGNOSIS — Z7982 Long term (current) use of aspirin: Secondary | ICD-10-CM | POA: Diagnosis not present

## 2019-10-26 DIAGNOSIS — I5032 Chronic diastolic (congestive) heart failure: Secondary | ICD-10-CM | POA: Insufficient documentation

## 2019-10-26 DIAGNOSIS — E1122 Type 2 diabetes mellitus with diabetic chronic kidney disease: Secondary | ICD-10-CM | POA: Diagnosis not present

## 2019-10-26 DIAGNOSIS — R531 Weakness: Secondary | ICD-10-CM | POA: Insufficient documentation

## 2019-10-26 DIAGNOSIS — N183 Chronic kidney disease, stage 3 unspecified: Secondary | ICD-10-CM | POA: Insufficient documentation

## 2019-10-26 DIAGNOSIS — Z79899 Other long term (current) drug therapy: Secondary | ICD-10-CM | POA: Diagnosis not present

## 2019-10-26 LAB — CBC WITH DIFFERENTIAL/PLATELET
Abs Immature Granulocytes: 0 10*3/uL (ref 0.00–0.07)
Basophils Absolute: 0 10*3/uL (ref 0.0–0.1)
Basophils Relative: 1 %
Eosinophils Absolute: 0.1 10*3/uL (ref 0.0–0.5)
Eosinophils Relative: 4 %
HCT: 41.2 % (ref 36.0–46.0)
Hemoglobin: 12.9 g/dL (ref 12.0–15.0)
Immature Granulocytes: 0 %
Lymphocytes Relative: 25 %
Lymphs Abs: 0.7 10*3/uL (ref 0.7–4.0)
MCH: 31.3 pg (ref 26.0–34.0)
MCHC: 31.3 g/dL (ref 30.0–36.0)
MCV: 100 fL (ref 80.0–100.0)
Monocytes Absolute: 0.2 10*3/uL (ref 0.1–1.0)
Monocytes Relative: 6 %
Neutro Abs: 1.7 10*3/uL (ref 1.7–7.7)
Neutrophils Relative %: 64 %
Platelets: 210 10*3/uL (ref 150–400)
RBC: 4.12 MIL/uL (ref 3.87–5.11)
RDW: 21.2 % — ABNORMAL HIGH (ref 11.5–15.5)
WBC: 2.6 10*3/uL — ABNORMAL LOW (ref 4.0–10.5)
nRBC: 0 % (ref 0.0–0.2)

## 2019-10-26 LAB — URINALYSIS, ROUTINE W REFLEX MICROSCOPIC
Bacteria, UA: NONE SEEN
Bilirubin Urine: NEGATIVE
Glucose, UA: NEGATIVE mg/dL
Ketones, ur: NEGATIVE mg/dL
Leukocytes,Ua: NEGATIVE
Nitrite: NEGATIVE
Protein, ur: 30 mg/dL — AB
Specific Gravity, Urine: 1.008 (ref 1.005–1.030)
pH: 8 (ref 5.0–8.0)

## 2019-10-26 LAB — COMPREHENSIVE METABOLIC PANEL
ALT: 13 U/L (ref 0–44)
AST: 27 U/L (ref 15–41)
Albumin: 3.5 g/dL (ref 3.5–5.0)
Alkaline Phosphatase: 55 U/L (ref 38–126)
Anion gap: 12 (ref 5–15)
BUN: 37 mg/dL — ABNORMAL HIGH (ref 8–23)
CO2: 26 mmol/L (ref 22–32)
Calcium: 8.9 mg/dL (ref 8.9–10.3)
Chloride: 101 mmol/L (ref 98–111)
Creatinine, Ser: 1.83 mg/dL — ABNORMAL HIGH (ref 0.44–1.00)
GFR calc Af Amer: 28 mL/min — ABNORMAL LOW (ref >60)
GFR calc non Af Amer: 24 mL/min — ABNORMAL LOW (ref >60)
Glucose, Bld: 88 mg/dL (ref 70–99)
Potassium: 4.3 mmol/L (ref 3.5–5.1)
Sodium: 139 mmol/L (ref 135–145)
Total Bilirubin: 0.4 mg/dL (ref 0.3–1.2)
Total Protein: 7.3 g/dL (ref 6.5–8.1)

## 2019-10-26 LAB — BRAIN NATRIURETIC PEPTIDE: B Natriuretic Peptide: 756.7 pg/mL — ABNORMAL HIGH (ref 0.0–100.0)

## 2019-10-26 LAB — TSH: TSH: 12.976 u[IU]/mL — ABNORMAL HIGH (ref 0.350–4.500)

## 2019-10-26 LAB — CK: Total CK: 96 U/L (ref 38–234)

## 2019-10-26 MED ORDER — FUROSEMIDE 80 MG PO TABS: 80.0000 mg | ORAL_TABLET | Freq: Two times a day (BID) | ORAL | 0 refills | Status: DC
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
## 2019-10-26 NOTE — ED Triage Notes (Signed)
Pt arrives via [facility] from home alone. Pt was trying to get out of bed to the bedside commode, and felt weak. Complains of bilateral lower leg weakness, weakness on left side of body as well. Some dizziness. BP 124/40, HR 60, CBG 118. Pt did not fall. No changes to vision or speech. Grips equal

## 2019-10-26 NOTE — ED Notes (Signed)
Pt currently eating a [place] Sandwich and crackers. Pt aware of plan to Ambulate after she eats and knows we still need a urine sample

## 2019-10-26 NOTE — ED Provider Notes (Signed)
Gina [name] is a 86 y.o. female, presenting to the ED with weakness in her left arm and leg that she states occurred last evening around 8 PM.  She states it was still present this morning, but then resolved.  She also notes she experienced decreased sensation in the left lower extremity.   HPI from Krista Blue, PA-C: "Gina [name] is a 86 y.o. female with PMH significant for type II DM, HTN, anemia, HFpEF, HLD, and hypothyroidism who presents to the ED via EMS with complaints of generalized weakness.  Patient reports that when she woke up this morning she was feeling mildly lightheaded with generalized weakness.  She was attempting to walk from her bed to her bedside commode, however too weak and subsequently called 911.  Obtained history from EMS who reports that on arrival she had felt improved and was initially resistant to coming to the ER for evaluation.  However, given her weakness and lightheadedness, decided it was best to be evaluated.  She denies any recent nausea, fevers or chills, chest pain or difficulty breathing, abdominal discomfort, nausea vomiting, urinary symptoms, melena, or other changes in bowel habits.  She reports that she chronically cannot see out of her right eye and that she has been experiencing episodes of intermittent, transient weakness for months."  Past Medical History:  Diagnosis Date  . Acute diastolic congestive heart failure ([place]) 01/28/2013  . AKI (acute kidney injury) ([place]) 10/24/2016  . Anemia 11/30/2014  . Arthritis    lt hip  . Depression   . Diabetes mellitus, type 2 ([place])    Well controlled  . Graves disease    HX OF GRAVES  . High cholesterol   . Hyperlipidemia   . Hypertension   . Intraductal carcinoma 06/2003   Of right breast. s/p right partial mastectomy. // Followed by Dr. Marylene Buerger  . Mesenteric ischemia ([place])   . PVD (peripheral vascular disease) ([place])    S/P BL femoral-popliteal bypass surgery 03/2001 (right) and 01/2003  (left)  . TB lung, latent    Treated with INH in 11/2007  . Thyroid disease    Graves disease  . Transfusion history    last admission 11-30-14  . Ulcer    gastric antral ulcer and AVMs     Physical Exam  BP (!) 175/45   Pulse 66   Temp 98.5 F (36.9 C) (Oral)   Resp 15   SpO2 99%   Physical Exam Vitals and nursing note reviewed.  Constitutional:      General: She is not in acute distress.    Appearance: She is well-developed. She is not diaphoretic.  HENT:     Head: Normocephalic and atraumatic.     Mouth/Throat:     Mouth: Mucous membranes are moist.     Pharynx: Oropharynx is clear.  Eyes:     Conjunctiva/sclera: Conjunctivae normal.  Cardiovascular:     Rate and Rhythm: Normal rate and regular rhythm.     Pulses: Normal pulses.          Radial pulses are 2+ on the right side and 2+ on the left side.       Posterior tibial pulses are 2+ on the right side and 2+ on the left side.     Heart sounds: Normal heart sounds.     Comments: Tactile temperature in the extremities appropriate and equal bilaterally. Pulmonary:     Effort: Pulmonary effort is normal. No respiratory distress.     Breath  sounds: Normal breath sounds.     Comments: No increased work of breathing.  Speaks in full sentences without difficulty. Abdominal:     Palpations: Abdomen is soft.     Tenderness: There is no abdominal tenderness. There is no guarding.  Musculoskeletal:     Cervical back: Neck supple.     Right lower leg: No edema.     Left lower leg: No edema.  Lymphadenopathy:     Cervical: No cervical adenopathy.  Skin:    General: Skin is warm and dry.  Neurological:     Mental Status: She is alert and oriented to person, place, and time.     Comments: No noted acute cognitive deficit. Sensation grossly intact to light touch in the extremities.   Grip strengths equal bilaterally.   Strength 5/5 in all extremities.  No gait disturbance.  Coordination intact.  Cranial nerves III-XII  grossly intact.  Handles oral secretions without noted difficulty.  No noted phonation or speech deficit. No facial droop.   Psychiatric:        Mood and Affect: Mood and affect normal.        Speech: Speech normal.        Behavior: Behavior normal.     ED Course/Procedures     Procedures   Abnormal Labs Reviewed  CBC WITH DIFFERENTIAL/PLATELET - Abnormal; Notable for the following components:      Result Value   WBC 2.6 (*)    RDW 21.2 (*)    All other components within normal limits  COMPREHENSIVE METABOLIC PANEL - Abnormal; Notable for the following components:   BUN 37 (*)    Creatinine, Ser 1.83 (*)    GFR calc non Af Amer 24 (*)    GFR calc Af Amer 28 (*)    All other components within normal limits  BRAIN NATRIURETIC PEPTIDE - Abnormal; Notable for the following components:   B Natriuretic Peptide 756.7 (*)    All other components within normal limits  URINALYSIS, ROUTINE W REFLEX MICROSCOPIC - Abnormal; Notable for the following components:   Color, Urine STRAW (*)    Hgb urine dipstick SMALL (*)    Protein, ur 30 (*)    All other components within normal limits  TSH - Abnormal; Notable for the following components:   TSH 12.976 (*)    All other components within normal limits    DG Chest 2 View  Result Date: 10/26/2019 CLINICAL DATA:  Weakness. EXAM: CHEST - 2 VIEW COMPARISON:  10/12/2019 FINDINGS: The cardiomediastinal silhouette is unchanged with normal heart size. Aortic atherosclerosis is noted. There are persistent small dependent pleural effusions bilaterally. Pleural fluid in the right minor fissure on the prior study is no longer present. There is improved aeration of the right middle lobe. Mild bibasilar opacities likely represent atelectasis. The interstitial markings are slightly more prominent than on the prior study without overt edema. No pneumothorax is identified. No acute osseous abnormality is seen. IMPRESSION: 1. Persistent small bilateral  pleural effusions. 2. Improved aeration of the right middle lobe. 3. Mild bibasilar atelectasis. Electronically Signed   By: Logan Bores M.D.   On: 10/26/2019 10:50   DG Chest 2 View  Result Date: 10/12/2019 CLINICAL DATA:  86 year old female with history of shortness of breath. COPD. Pleural effusions. EXAM: CHEST - 2 VIEW COMPARISON:  Chest x-ray 09/25/2019. FINDINGS: Decreased size of small bilateral, with some pleural effusions residual pleural fluid tracking in the minor fissure. Atelectasis and/or consolidation in the  right middle lobe. Lungs otherwise appear relatively clear. No evidence of pulmonary edema. Heart size is normal. Upper mediastinal contours are within normal limits. Aortic atherosclerosis. IMPRESSION: 1. Decreasing small bilateral pleural effusions. 2. Atelectasis and/or consolidation in the right middle lobe. Follow-up standing PA and lateral chest radiograph is recommended in 2-3 weeks to ensure the resolution of this finding. Should this finding fail to resolve, further evaluation with contrast enhanced chest CT would be recommended to exclude the possibility of a centrally obstructing neoplasm. 3. Aortic atherosclerosis. Electronically Signed   By: Vinnie Langton M.D.   On: 10/12/2019 18:19   CT Head Wo Contrast  Result Date: 10/26/2019 CLINICAL DATA:  Focal neuro deficit, greater than 6 hours, stroke suspected. Additional history provided: Patient reports weakness, bilateral lower leg weakness as well as weakness on the left side of her body, some dizziness. EXAM: CT HEAD WITHOUT CONTRAST TECHNIQUE: Contiguous axial images were obtained from the base of the skull through the vertex without intravenous contrast. COMPARISON:  Head CT 03/26/2011 FINDINGS: Brain: No evidence of acute intracranial hemorrhage. No demarcated cortical infarction. No evidence of intracranial mass. No midline shift or extra-axial fluid collection. Moderate ill-defined hypoattenuation within the cerebral  white matter is nonspecific, but consistent with chronic small vessel ischemic disease. Moderate generalized parenchymal atrophy. Vascular: No hyperdense vessel. Atherosclerotic calcifications. Skull: Normal. Negative for fracture or focal lesion. Sinuses/Orbits: Presumed glaucoma valve along the right globe. No significant paranasal sinus disease or mastoid effusion within the imaged levels. IMPRESSION: No evidence of acute intracranial abnormality. Moderate generalized parenchymal atrophy and chronic small vessel ischemic disease. Electronically Signed   By: Kellie Simmering DO   On: 10/26/2019 11:06    EKG Interpretation  Date/Time:  Tuesday October 26 2019 09:54:52 EST Ventricular Rate:  58 PR Interval:    QRS Duration: 99 QT Interval:  485 QTC Calculation: 477 R Axis:   133 Text Interpretation: Sinus or ectopic atrial rhythm Right axis deviation Repol abnrm suggests ischemia, anterolateral No significant change since 12/20 Confirmed by Aletta Edouard [phone]) on 10/26/2019 9:58:32 AM       MDM    Clinical Course as of Oct 25 1924  Tue Oct 25, 6850  4098 86 year old female here by ambulance for evaluation of generalized weakness and she said her left leg gave out on her and was weak.  She said her right leg gave out on her last week.  Last known well was last evening.  Getting labs head CT.   [MB]  [place] with Dr. Rory Percy, neurologist.  He suggests MRI brain without contrast. If negative, she can follow-up outpatient.  If stroke, then she would need to be admitted for ongoing management.    [GG]  3500 Discussed patient again with Dr. Rory Percy, neurologist. On chart review, it seems as though patient has baseline weakness in her legs, especially on the left. Since she cannot undergo MRI, there is not another imaging study we can perform here in the ED that he thinks would be helpful.  If she had had a large stroke, we should have been able to see it even on CT. Even if she were to have  slightly neuro deficits or signs of TIA, there is not much more that can be done as far as evaluation on admission rather than as an office follow-up. She may or may not need PT/OT evaluation, even as an outpatient.   [SJ]    Clinical Course User Index [GG] Corena Herter, PA-C [MB] Melina Copa,  Rebeca Alert, MD [SJ] Lorayne Bender, PA-C   Patient care handoff report received from Krista Blue, PA-C. Plan: Make communication with neurology again since we are unable to perform MRI.  Disposition appropriately.  Patient presents with weakness she states occurred on the left.  This resolved prior to ED arrival and did not recur.  On my exam, she had no strength differences between the 2 sides.  She ambulated without noted difficulty. Her work-up here in the ED is overall reassuring.  Improvement in TSH over previous value.  Patient lives at home, but has a home health aide who comes to see her daily. She should follow-up with her primary care provider for any further work-up.  She does have stroke risk factors, but is already on aspirin therapy.  Findings and plan of care discussed with Madalyn Rob, MD.     Vitals:   10/26/19 1230 10/26/19 1415 10/26/19 1515 10/26/19 1645  BP: (!) 162/44 (!) 175/45 (!) 167/99 (!) 171/59  Pulse: (!) 56 66 60 62  Resp: 15 15 12 18   Temp:      TempSrc:      SpO2: 100% 99% 99% 96%        Layla Maw 10/26/19 1930    Lucrezia Starch, MD 10/26/19 2202

## 2019-10-26 NOTE — Telephone Encounter (Signed)
Pharmacy is aware to send request to cardiologist.

## 2019-10-26 NOTE — Discharge Instructions (Signed)
Findings here today were overall reassuring.  Please follow-up with your primary care provider for any further management.

## 2019-10-26 NOTE — ED Notes (Signed)
Pt ambulated with walker in room. Steady on feet. Pt denies any dizziness. Pt tolerated very well.

## 2019-10-27 LAB — ACID FAST CULTURE WITH REFLEXED SENSITIVITIES (MYCOBACTERIA): Acid Fast Culture: NEGATIVE

## 2019-11-17 NOTE — Telephone Encounter (Signed)
This encounter was created in error - please disregard.

## 2019-11-18 ENCOUNTER — Ambulatory Visit (INDEPENDENT_AMBULATORY_CARE_PROVIDER_SITE_OTHER): Payer: Medicare Other | Admitting: Primary Care

## 2019-11-18 ENCOUNTER — Ambulatory Visit: Payer: Medicare Other | Admitting: Physician Assistant

## 2019-11-23 ENCOUNTER — Other Ambulatory Visit: Payer: Self-pay | Admitting: Cardiology

## 2019-11-24 ENCOUNTER — Encounter: Payer: Self-pay | Admitting: Physician Assistant

## 2019-11-24 ENCOUNTER — Ambulatory Visit (INDEPENDENT_AMBULATORY_CARE_PROVIDER_SITE_OTHER): Payer: Medicare Other | Admitting: Physician Assistant

## 2019-11-24 ENCOUNTER — Other Ambulatory Visit: Payer: Self-pay

## 2019-11-24 VITALS — BP 130/78 | HR 69 | Temp 97.9°F | Ht 67.0 in | Wt 106.0 lb

## 2019-11-24 DIAGNOSIS — I739 Peripheral vascular disease, unspecified: Secondary | ICD-10-CM | POA: Diagnosis not present

## 2019-11-24 DIAGNOSIS — I5032 Chronic diastolic (congestive) heart failure: Secondary | ICD-10-CM | POA: Diagnosis not present

## 2019-11-24 DIAGNOSIS — E119 Type 2 diabetes mellitus without complications: Secondary | ICD-10-CM

## 2019-11-24 DIAGNOSIS — E785 Hyperlipidemia, unspecified: Secondary | ICD-10-CM | POA: Diagnosis not present

## 2019-11-24 DIAGNOSIS — I10 Essential (primary) hypertension: Secondary | ICD-10-CM

## 2019-11-24 DIAGNOSIS — N184 Chronic kidney disease, stage 4 (severe): Secondary | ICD-10-CM
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
## 2019-11-24 NOTE — Patient Instructions (Signed)
Medication Instructions:  Your physician recommends that you continue on your current medications as directed. Please refer to the Current Medication list given to you today.  *If you need a refill on your cardiac medications before your next appointment, please call your pharmacy*  Follow-Up: At [facility], you and your health needs are our priority.  As part of our continuing mission to provide you with exceptional heart care, we have created designated Provider Care Teams.  These Care Teams include your primary Cardiologist (physician) and Advanced Practice Providers (APPs -  Physician Assistants and Nurse Practitioners) who all work together to provide you with the care you need, when you need it.  Your next appointment:  As scheduled with Dr. Lolly Mustache

## 2019-11-26 ENCOUNTER — Encounter: Payer: Self-pay | Admitting: Physician Assistant

## 2019-12-20 ENCOUNTER — Other Ambulatory Visit (INDEPENDENT_AMBULATORY_CARE_PROVIDER_SITE_OTHER): Payer: Self-pay | Admitting: Primary Care

## 2019-12-20 DIAGNOSIS — K5900 Constipation, unspecified: Secondary | ICD-10-CM

## 2019-12-20 DIAGNOSIS — I10 Essential (primary) hypertension: Secondary | ICD-10-CM

## 2019-12-20 DIAGNOSIS — I739 Peripheral vascular disease, unspecified: Secondary | ICD-10-CM

## 2019-12-20 DIAGNOSIS — E039 Hypothyroidism, unspecified: Secondary | ICD-10-CM

## 2019-12-21 NOTE — Telephone Encounter (Signed)
Sent to PCP ?

## 2019-12-23 ENCOUNTER — Inpatient Hospital Stay (HOSPITAL_COMMUNITY)
Admission: EM | Admit: 2019-12-23 | Discharge: 2019-12-28 | DRG: 378 | Disposition: A | Discharge status: Skilled Nursing Facility | Payer: Medicare Other | Attending: Internal Medicine | Admitting: Internal Medicine

## 2019-12-23 ENCOUNTER — Emergency Department (HOSPITAL_COMMUNITY): Payer: Medicare Other

## 2019-12-23 ENCOUNTER — Observation Stay (HOSPITAL_COMMUNITY): Payer: Medicare Other

## 2019-12-23 DIAGNOSIS — J449 Chronic obstructive pulmonary disease, unspecified: Secondary | ICD-10-CM | POA: Diagnosis present

## 2019-12-23 DIAGNOSIS — K31811 Angiodysplasia of stomach and duodenum with bleeding: Principal | ICD-10-CM | POA: Diagnosis present

## 2019-12-23 DIAGNOSIS — M1990 Unspecified osteoarthritis, unspecified site: Secondary | ICD-10-CM | POA: Diagnosis present

## 2019-12-23 DIAGNOSIS — N184 Chronic kidney disease, stage 4 (severe): Secondary | ICD-10-CM

## 2019-12-23 DIAGNOSIS — M25552 Pain in left hip: Secondary | ICD-10-CM | POA: Diagnosis present

## 2019-12-23 DIAGNOSIS — E1151 Type 2 diabetes mellitus with diabetic peripheral angiopathy without gangrene: Secondary | ICD-10-CM | POA: Diagnosis present

## 2019-12-23 DIAGNOSIS — K2970 Gastritis, unspecified, without bleeding: Secondary | ICD-10-CM | POA: Diagnosis present

## 2019-12-23 DIAGNOSIS — E039 Hypothyroidism, unspecified: Secondary | ICD-10-CM | POA: Diagnosis present

## 2019-12-23 DIAGNOSIS — D509 Iron deficiency anemia, unspecified: Secondary | ICD-10-CM | POA: Diagnosis present

## 2019-12-23 DIAGNOSIS — R200 Anesthesia of skin: Secondary | ICD-10-CM | POA: Diagnosis present

## 2019-12-23 DIAGNOSIS — I5032 Chronic diastolic (congestive) heart failure: Secondary | ICD-10-CM | POA: Diagnosis present

## 2019-12-23 DIAGNOSIS — Z8049 Family history of malignant neoplasm of other genital organs: Secondary | ICD-10-CM

## 2019-12-23 DIAGNOSIS — F329 Major depressive disorder, single episode, unspecified: Secondary | ICD-10-CM | POA: Diagnosis present

## 2019-12-23 DIAGNOSIS — E785 Hyperlipidemia, unspecified: Secondary | ICD-10-CM | POA: Diagnosis present

## 2019-12-23 DIAGNOSIS — E1122 Type 2 diabetes mellitus with diabetic chronic kidney disease: Secondary | ICD-10-CM | POA: Diagnosis present

## 2019-12-23 DIAGNOSIS — E0500 Thyrotoxicosis with diffuse goiter without thyrotoxic crisis or storm: Secondary | ICD-10-CM | POA: Diagnosis present

## 2019-12-23 DIAGNOSIS — R7989 Other specified abnormal findings of blood chemistry: Secondary | ICD-10-CM | POA: Diagnosis present

## 2019-12-23 DIAGNOSIS — K922 Gastrointestinal hemorrhage, unspecified: Secondary | ICD-10-CM

## 2019-12-23 DIAGNOSIS — I130 Hypertensive heart and chronic kidney disease with heart failure and stage 1 through stage 4 chronic kidney disease, or unspecified chronic kidney disease: Secondary | ICD-10-CM | POA: Diagnosis present

## 2019-12-23 DIAGNOSIS — E876 Hypokalemia: Secondary | ICD-10-CM | POA: Diagnosis not present

## 2019-12-23 DIAGNOSIS — Z7982 Long term (current) use of aspirin: Secondary | ICD-10-CM

## 2019-12-23 DIAGNOSIS — W19XXXA Unspecified fall, initial encounter: Secondary | ICD-10-CM | POA: Diagnosis present

## 2019-12-23 DIAGNOSIS — Z79899 Other long term (current) drug therapy: Secondary | ICD-10-CM

## 2019-12-23 DIAGNOSIS — D649 Anemia, unspecified: Secondary | ICD-10-CM

## 2019-12-23 DIAGNOSIS — Z20822 Contact with and (suspected) exposure to covid-19: Secondary | ICD-10-CM | POA: Diagnosis present

## 2019-12-23 DIAGNOSIS — Z79890 Hormone replacement therapy (postmenopausal): Secondary | ICD-10-CM

## 2019-12-23 DIAGNOSIS — Z9011 Acquired absence of right breast and nipple: Secondary | ICD-10-CM

## 2019-12-23 DIAGNOSIS — Z853 Personal history of malignant neoplasm of breast: Secondary | ICD-10-CM

## 2019-12-23 DIAGNOSIS — Z8711 Personal history of peptic ulcer disease: Secondary | ICD-10-CM

## 2019-12-23 DIAGNOSIS — Z95828 Presence of other vascular implants and grafts: Secondary | ICD-10-CM

## 2019-12-23 DIAGNOSIS — K559 Vascular disorder of intestine, unspecified: Secondary | ICD-10-CM | POA: Diagnosis present

## 2019-12-23 DIAGNOSIS — Z87891 Personal history of nicotine dependence: Secondary | ICD-10-CM

## 2019-12-23 DIAGNOSIS — K259 Gastric ulcer, unspecified as acute or chronic, without hemorrhage or perforation: Secondary | ICD-10-CM | POA: Diagnosis present

## 2019-12-23 DIAGNOSIS — Z681 Body mass index (BMI) 19 or less, adult: Secondary | ICD-10-CM

## 2019-12-23 DIAGNOSIS — R195 Other fecal abnormalities: Secondary | ICD-10-CM | POA: Diagnosis present

## 2019-12-23 LAB — PROTIME-INR
INR: 1.1 (ref 0.8–1.2)
Prothrombin Time: 14.5 seconds (ref 11.4–15.2)

## 2019-12-23 LAB — RESPIRATORY PANEL BY RT PCR (FLU A&B, COVID)
Influenza A by PCR: NEGATIVE
Influenza B by PCR: NEGATIVE
SARS Coronavirus 2 by RT PCR: NEGATIVE

## 2019-12-23 LAB — TSH: TSH: 2.269 u[IU]/mL (ref 0.350–4.500)

## 2019-12-23 LAB — COMPREHENSIVE METABOLIC PANEL
ALT: 14 U/L (ref 0–44)
AST: 22 U/L (ref 15–41)
Albumin: 3.1 g/dL — ABNORMAL LOW (ref 3.5–5.0)
Alkaline Phosphatase: 60 U/L (ref 38–126)
Anion gap: 12 (ref 5–15)
BUN: 53 mg/dL — ABNORMAL HIGH (ref 8–23)
CO2: 24 mmol/L (ref 22–32)
Calcium: 9 mg/dL (ref 8.9–10.3)
Chloride: 104 mmol/L (ref 98–111)
Creatinine, Ser: 1.78 mg/dL — ABNORMAL HIGH (ref 0.44–1.00)
GFR calc Af Amer: 29 mL/min — ABNORMAL LOW (ref >60)
GFR calc non Af Amer: 25 mL/min — ABNORMAL LOW (ref >60)
Glucose, Bld: 131 mg/dL — ABNORMAL HIGH (ref 70–99)
Potassium: 3.3 mmol/L — ABNORMAL LOW (ref 3.5–5.1)
Sodium: 140 mmol/L (ref 135–145)
Total Bilirubin: 0.6 mg/dL (ref 0.3–1.2)
Total Protein: 6.7 g/dL (ref 6.5–8.1)

## 2019-12-23 LAB — HEMATOCRIT: HCT: 23.9 % — ABNORMAL LOW (ref 36.0–46.0)

## 2019-12-23 LAB — CBC
HCT: 22.6 % — ABNORMAL LOW (ref 36.0–46.0)
Hemoglobin: 7 g/dL — ABNORMAL LOW (ref 12.0–15.0)
MCH: 30.3 pg (ref 26.0–34.0)
MCHC: 31 g/dL (ref 30.0–36.0)
MCV: 97.8 fL (ref 80.0–100.0)
Platelets: 299 10*3/uL (ref 150–400)
RBC: 2.31 MIL/uL — ABNORMAL LOW (ref 3.87–5.11)
RDW: 13.8 % (ref 11.5–15.5)
WBC: 5.9 10*3/uL (ref 4.0–10.5)
nRBC: 0 % (ref 0.0–0.2)

## 2019-12-23 LAB — TROPONIN I (HIGH SENSITIVITY)
Troponin I (High Sensitivity): 47 ng/L — ABNORMAL HIGH (ref <18)
Troponin I (High Sensitivity): 53 ng/L — ABNORMAL HIGH (ref <18)

## 2019-12-23 LAB — CK: Total CK: 358 U/L — ABNORMAL HIGH (ref 38–234)

## 2019-12-23 LAB — PHOSPHORUS: Phosphorus: 3.9 mg/dL (ref 2.5–4.6)

## 2019-12-23 LAB — PREPARE RBC (CROSSMATCH)

## 2019-12-23 LAB — HEMOGLOBIN: Hemoglobin: 7.4 g/dL — ABNORMAL LOW (ref 12.0–15.0)

## 2019-12-23 LAB — BRAIN NATRIURETIC PEPTIDE: B Natriuretic Peptide: 724.7 pg/mL — ABNORMAL HIGH (ref 0.0–100.0)

## 2019-12-23 LAB — MAGNESIUM: Magnesium: 2 mg/dL (ref 1.7–2.4)

## 2019-12-23 LAB — POC OCCULT BLOOD, ED: Fecal Occult Bld: POSITIVE — AB

## 2019-12-23 MED ORDER — FUROSEMIDE 10 MG/ML IJ SOLN
80.0000 mg | Freq: Once | INTRAMUSCULAR | Status: AC
  Administered 2019-12-23: 80 mg via INTRAVENOUS
  Filled 2019-12-23: qty 8

## 2019-12-23 MED ORDER — POTASSIUM CHLORIDE CRYS ER 20 MEQ PO TBCR
40.0000 meq | EXTENDED_RELEASE_TABLET | Freq: Once | ORAL | Status: AC
  Administered 2019-12-23: 40 meq via ORAL
  Filled 2019-12-23: qty 2

## 2019-12-23 MED ORDER — VITAMIN B-12 1000 MCG PO TABS
500.0000 ug | ORAL_TABLET | Freq: Every day | ORAL | Status: DC
  Administered 2019-12-24 – 2019-12-28 (×5): 500 ug via ORAL
  Filled 2019-12-23 (×5): qty 1

## 2019-12-23 MED ORDER — ATORVASTATIN CALCIUM 10 MG PO TABS
20.0000 mg | ORAL_TABLET | Freq: Every day | ORAL | Status: DC
  Administered 2019-12-24 – 2019-12-28 (×5): 20 mg via ORAL
  Filled 2019-12-23 (×5): qty 2

## 2019-12-23 MED ORDER — LINACLOTIDE 145 MCG PO CAPS
145.0000 ug | ORAL_CAPSULE | Freq: Every day | ORAL | Status: DC
  Administered 2019-12-24 – 2019-12-27 (×4): 145 ug via ORAL
  Filled 2019-12-23 (×5): qty 1

## 2019-12-23 MED ORDER — NETARSUDIL DIMESYLATE 0.02 % OP SOLN: 1.0000 [drp] | Freq: Every day | OPHTHALMIC | Status: DC

## 2019-12-23 MED ORDER — VITAMIN E 180 MG (400 UNIT) PO CAPS
400.0000 [IU] | ORAL_CAPSULE | Freq: Every day | ORAL | Status: DC
  Administered 2019-12-24 – 2019-12-28 (×5): 400 [IU] via ORAL
  Filled 2019-12-23 (×5): qty 1

## 2019-12-23 MED ORDER — CALCIUM CARBONATE 1250 (500 CA) MG PO TABS
500.0000 mg | ORAL_TABLET | Freq: Every day | ORAL | Status: DC
  Administered 2019-12-24 – 2019-12-28 (×5): 500 mg via ORAL
  Filled 2019-12-23 (×5): qty 1

## 2019-12-23 MED ORDER — LATANOPROST 0.005 % OP SOLN
1.0000 [drp] | Freq: Every day | OPHTHALMIC | Status: DC
  Administered 2019-12-24 – 2019-12-27 (×5): 1 [drp] via OPHTHALMIC
  Filled 2019-12-23: qty 2.5

## 2019-12-23 MED ORDER — LEVOTHYROXINE SODIUM 112 MCG PO TABS
112.0000 ug | ORAL_TABLET | Freq: Every day | ORAL | Status: DC
  Administered 2019-12-24 – 2019-12-28 (×5): 112 ug via ORAL
  Filled 2019-12-23 (×5): qty 1

## 2019-12-23 MED ORDER — MIRTAZAPINE 15 MG PO TABS
7.5000 mg | ORAL_TABLET | Freq: Every day | ORAL | Status: DC
  Administered 2019-12-23 – 2019-12-27 (×5): 7.5 mg via ORAL
  Filled 2019-12-23 (×5): qty 1

## 2019-12-23 MED ORDER — SODIUM CHLORIDE 0.9 % IV SOLN: 10.0000 mL/h | Freq: Once | INTRAVENOUS | Status: DC

## 2019-12-23 MED ORDER — SODIUM CHLORIDE 0.9 % IV SOLN
8.0000 mg/h | INTRAVENOUS | Status: DC
  Administered 2019-12-24 (×3): 8 mg/h via INTRAVENOUS
  Filled 2019-12-23 (×5): qty 80

## 2019-12-23 MED ORDER — ALBUTEROL SULFATE (2.5 MG/3ML) 0.083% IN NEBU: 3.0000 mL | INHALATION_SOLUTION | Freq: Four times a day (QID) | RESPIRATORY_TRACT | Status: DC | PRN

## 2019-12-23 MED ORDER — SODIUM CHLORIDE 0.9 % IV SOLN
80.0000 mg | Freq: Once | INTRAVENOUS | Status: DC
  Filled 2019-12-23 (×2): qty 80

## 2019-12-23 MED ORDER — FERROUS SULFATE 325 (65 FE) MG PO TABS
325.0000 mg | ORAL_TABLET | Freq: Every day | ORAL | Status: DC
  Administered 2019-12-24 – 2019-12-28 (×5): 325 mg via ORAL
  Filled 2019-12-23 (×5): qty 1

## 2019-12-23 MED ORDER — SODIUM CHLORIDE 0.9 % IV SOLN
80.0000 mg | Freq: Once | INTRAVENOUS | Status: AC
  Administered 2019-12-23: 80 mg via INTRAVENOUS
  Filled 2019-12-23: qty 80

## 2019-12-23 MED ORDER — AMLODIPINE BESYLATE 10 MG PO TABS
10.0000 mg | ORAL_TABLET | Freq: Every day | ORAL | Status: DC
  Administered 2019-12-24 – 2019-12-28 (×5): 10 mg via ORAL
  Filled 2019-12-23 (×5): qty 1

## 2019-12-23 MED ORDER — FUROSEMIDE 80 MG PO TABS
80.0000 mg | ORAL_TABLET | Freq: Two times a day (BID) | ORAL | Status: DC
  Administered 2019-12-24 – 2019-12-28 (×10): 80 mg via ORAL
  Filled 2019-12-23 (×10): qty 1

## 2019-12-23 MED ORDER — PANTOPRAZOLE SODIUM 40 MG IV SOLR: 40.0000 mg | Freq: Two times a day (BID) | INTRAVENOUS | Status: DC

## 2019-12-23 NOTE — ED Notes (Signed)
Annette  [phone] the niece please call with an update

## 2019-12-23 NOTE — ED Triage Notes (Signed)
Per EMS-patient had mechanical fall last night and was on the floor until this morning when found by family. No LOC, no head, neck or back pain. Family tried to take her to an urgent care but unable d/t pt's inability to walk d/t L hip pain. No deformity, shortening, or rotation. "Hurts all over" d/t lying on hardwood floor all night.

## 2019-12-23 NOTE — Progress Notes (Signed)
New Admission Note:  Arrival Method: Stretcher from ED Mental Orientation: Responds to voice, A&Ox4 Telemetry: Box #10 Assessment: Completed Skin: Intact/ Verified by CN IV: PIV Left FA Pain: 0/10 Tubes: None Safety Measures: Safety Fall Prevention Plan was discussed, placed in low bed per protocol Admission: Initiated Belongings: Clothing in bag at bedside Unit Orientation: Patient has been orientated to the room, unit, and the staff.  Orders have been reviewed and implemented. Call light has been placed within reach and bed alarm has been activated. Will continue to monitor the patient.  Percell Boston, RN
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
## 2019-12-24 ENCOUNTER — Observation Stay (HOSPITAL_COMMUNITY): Payer: Medicare Other | Admitting: Anesthesiology

## 2019-12-24 ENCOUNTER — Other Ambulatory Visit: Payer: Self-pay

## 2019-12-24 ENCOUNTER — Encounter (HOSPITAL_COMMUNITY): Payer: Self-pay | Admitting: Internal Medicine

## 2019-12-24 ENCOUNTER — Encounter (HOSPITAL_COMMUNITY)
Admission: EM | Disposition: A | Discharge status: Skilled Nursing Facility | Payer: Self-pay | Source: Home / Self Care | Attending: Internal Medicine

## 2019-12-24 DIAGNOSIS — I5032 Chronic diastolic (congestive) heart failure: Secondary | ICD-10-CM | POA: Diagnosis present

## 2019-12-24 DIAGNOSIS — F329 Major depressive disorder, single episode, unspecified: Secondary | ICD-10-CM | POA: Diagnosis present

## 2019-12-24 DIAGNOSIS — Z8711 Personal history of peptic ulcer disease: Secondary | ICD-10-CM | POA: Diagnosis not present

## 2019-12-24 DIAGNOSIS — K259 Gastric ulcer, unspecified as acute or chronic, without hemorrhage or perforation: Secondary | ICD-10-CM | POA: Diagnosis present

## 2019-12-24 DIAGNOSIS — D509 Iron deficiency anemia, unspecified: Secondary | ICD-10-CM | POA: Diagnosis present

## 2019-12-24 DIAGNOSIS — Z8049 Family history of malignant neoplasm of other genital organs: Secondary | ICD-10-CM | POA: Diagnosis not present

## 2019-12-24 DIAGNOSIS — M25552 Pain in left hip: Secondary | ICD-10-CM | POA: Diagnosis present

## 2019-12-24 DIAGNOSIS — Z20822 Contact with and (suspected) exposure to covid-19: Secondary | ICD-10-CM | POA: Diagnosis present

## 2019-12-24 DIAGNOSIS — K31811 Angiodysplasia of stomach and duodenum with bleeding: Secondary | ICD-10-CM | POA: Diagnosis present

## 2019-12-24 DIAGNOSIS — E039 Hypothyroidism, unspecified: Secondary | ICD-10-CM | POA: Diagnosis present

## 2019-12-24 DIAGNOSIS — Z7982 Long term (current) use of aspirin: Secondary | ICD-10-CM | POA: Diagnosis not present

## 2019-12-24 DIAGNOSIS — E1151 Type 2 diabetes mellitus with diabetic peripheral angiopathy without gangrene: Secondary | ICD-10-CM | POA: Diagnosis present

## 2019-12-24 DIAGNOSIS — K31819 Angiodysplasia of stomach and duodenum without bleeding: Secondary | ICD-10-CM | POA: Diagnosis not present

## 2019-12-24 DIAGNOSIS — W19XXXA Unspecified fall, initial encounter: Secondary | ICD-10-CM | POA: Diagnosis present

## 2019-12-24 DIAGNOSIS — E785 Hyperlipidemia, unspecified: Secondary | ICD-10-CM | POA: Diagnosis present

## 2019-12-24 DIAGNOSIS — D649 Anemia, unspecified: Secondary | ICD-10-CM | POA: Diagnosis present

## 2019-12-24 DIAGNOSIS — I130 Hypertensive heart and chronic kidney disease with heart failure and stage 1 through stage 4 chronic kidney disease, or unspecified chronic kidney disease: Secondary | ICD-10-CM | POA: Diagnosis present

## 2019-12-24 DIAGNOSIS — E876 Hypokalemia: Secondary | ICD-10-CM | POA: Diagnosis present

## 2019-12-24 DIAGNOSIS — Z87891 Personal history of nicotine dependence: Secondary | ICD-10-CM | POA: Diagnosis not present

## 2019-12-24 DIAGNOSIS — Z79890 Hormone replacement therapy (postmenopausal): Secondary | ICD-10-CM | POA: Diagnosis not present

## 2019-12-24 DIAGNOSIS — Z681 Body mass index (BMI) 19 or less, adult: Secondary | ICD-10-CM | POA: Diagnosis not present

## 2019-12-24 DIAGNOSIS — E0500 Thyrotoxicosis with diffuse goiter without thyrotoxic crisis or storm: Secondary | ICD-10-CM | POA: Diagnosis present

## 2019-12-24 DIAGNOSIS — E1122 Type 2 diabetes mellitus with diabetic chronic kidney disease: Secondary | ICD-10-CM | POA: Diagnosis present

## 2019-12-24 DIAGNOSIS — K559 Vascular disorder of intestine, unspecified: Secondary | ICD-10-CM | POA: Diagnosis present

## 2019-12-24 DIAGNOSIS — R7989 Other specified abnormal findings of blood chemistry: Secondary | ICD-10-CM | POA: Diagnosis present

## 2019-12-24 DIAGNOSIS — R200 Anesthesia of skin: Secondary | ICD-10-CM | POA: Diagnosis present

## 2019-12-24 DIAGNOSIS — N184 Chronic kidney disease, stage 4 (severe): Secondary | ICD-10-CM | POA: Diagnosis present

## 2019-12-24 HISTORY — PX: ENTEROSCOPY: SHX5533

## 2019-12-24 HISTORY — PX: HOT HEMOSTASIS: SHX5433

## 2019-12-24 LAB — TYPE AND SCREEN
ABO/RH(D): O POS
Antibody Screen: NEGATIVE
Unit division: 0

## 2019-12-24 LAB — BPAM RBC
Blood Product Expiration Date: 202104262359
ISSUE DATE / TIME: 202103252343
Unit Type and Rh: 5100

## 2019-12-24 LAB — CBC
HCT: 33.6 % — ABNORMAL LOW (ref 36.0–46.0)
HCT: 33.9 % — ABNORMAL LOW (ref 36.0–46.0)
Hemoglobin: 10.9 g/dL — ABNORMAL LOW (ref 12.0–15.0)
Hemoglobin: 11 g/dL — ABNORMAL LOW (ref 12.0–15.0)
MCH: 30.2 pg (ref 26.0–34.0)
MCH: 30.4 pg (ref 26.0–34.0)
MCHC: 32.4 g/dL (ref 30.0–36.0)
MCHC: 32.4 g/dL (ref 30.0–36.0)
MCV: 93.1 fL (ref 80.0–100.0)
MCV: 93.6 fL (ref 80.0–100.0)
Platelets: 301 10*3/uL (ref 150–400)
Platelets: 306 10*3/uL (ref 150–400)
RBC: 3.61 MIL/uL — ABNORMAL LOW (ref 3.87–5.11)
RBC: 3.62 MIL/uL — ABNORMAL LOW (ref 3.87–5.11)
RDW: 14 % (ref 11.5–15.5)
RDW: 13.9 % (ref 11.5–15.5)
WBC: 5.1 10*3/uL (ref 4.0–10.5)
WBC: 4.9 10*3/uL (ref 4.0–10.5)
nRBC: 0 % (ref 0.0–0.2)
nRBC: 0 % (ref 0.0–0.2)

## 2019-12-24 LAB — BASIC METABOLIC PANEL
Anion gap: 12 (ref 5–15)
BUN: 45 mg/dL — ABNORMAL HIGH (ref 8–23)
CO2: 24 mmol/L (ref 22–32)
Calcium: 9.2 mg/dL (ref 8.9–10.3)
Chloride: 107 mmol/L (ref 98–111)
Creatinine, Ser: 1.67 mg/dL — ABNORMAL HIGH (ref 0.44–1.00)
GFR calc Af Amer: 31 mL/min — ABNORMAL LOW (ref >60)
GFR calc non Af Amer: 27 mL/min — ABNORMAL LOW (ref >60)
Glucose, Bld: 84 mg/dL (ref 70–99)
Potassium: 3.9 mmol/L (ref 3.5–5.1)
Sodium: 143 mmol/L (ref 135–145)

## 2019-12-24 LAB — CK: Total CK: 262 U/L — ABNORMAL HIGH (ref 38–234)

## 2019-12-24 LAB — HEMATOCRIT: HCT: 31.7 % — ABNORMAL LOW (ref 36.0–46.0)

## 2019-12-24 LAB — GLUCOSE, CAPILLARY: Glucose-Capillary: 82 mg/dL (ref 70–99)

## 2019-12-24 LAB — HEMOGLOBIN: Hemoglobin: 10.2 g/dL — ABNORMAL LOW (ref 12.0–15.0)

## 2019-12-24 SURGERY — ENTEROSCOPY
Anesthesia: Monitor Anesthesia Care

## 2019-12-24 MED ORDER — PROPOFOL 500 MG/50ML IV EMUL
INTRAVENOUS | Status: DC | PRN
  Administered 2019-12-24: 50 ug/kg/min via INTRAVENOUS

## 2019-12-24 MED ORDER — SODIUM CHLORIDE 0.9 % IV SOLN: INTRAVENOUS | Status: DC

## 2019-12-24 MED ORDER — SODIUM CHLORIDE 0.9 % IV SOLN
INTRAVENOUS | Status: DC | PRN
  Administered 2019-12-24: 250 mL via INTRAVENOUS

## 2019-12-24 NOTE — Evaluation (Signed)
Occupational Therapy Evaluation Patient Details Name: Gina [name] MRN: [id number] DOB: 11/01/1928 Today's Date: 12/24/2019    History of Present Illness Pt is an 86 year old female with a history of HFpEF, DM, HTN, PVD, HLD, CKD IIIb, anemia, and breast carcinoma s/p mastectomy, who presents 12/23/18 after being found on the floor where she had been after falling approximately 10 hours prior. Pt unable to walk with L hip pain (xray non revealing), Head CT negative, pt + for anemia and blood in stool.    Clinical Impression   Pt was ambulating with a walker and assist for showering, dressing and housekeeping by her aide who visits daily and reports being able to prepare her own meals. Her niece gets her groceries. Pt presents with pain, generalized weakness and impaired standing balance. She needed 2 person assist to stand and maximum encouragement to attempt. Pt requires set up to total assist for ADL. Recommending further rehab in SNF when medically ready. Will follow acutely.    Follow Up Recommendations  SNF;Supervision/Assistance - 24 hour    Equipment Recommendations  3 in 1 bedside commode    Recommendations for Other Services       Precautions / Restrictions Precautions Precautions: Fall Restrictions Weight Bearing Restrictions: No      Mobility Bed Mobility Overal bed mobility: Needs Assistance Bed Mobility: Sidelying to Sit;Sit to Supine   Sidelying to sit: +2 for physical assistance;Max assist   Sit to supine: +2 for physical assistance;Total assist   General bed mobility comments: cues for technique, assist for LEs over EOB and to raise trunk, pt attempting to use rail but ineffective, assist for LEs and to guide trunk to return to supine  Transfers Overall transfer level: Needs assistance Equipment used: 2 person hand held assist Transfers: Sit to/from Stand Sit to Stand: Mod assist;+2 physical assistance         General transfer comment: assist to rise  and steady, pt requiring maximum encouragement to attempt as she was not able to stand when she was admitted    Balance Overall balance assessment: Needs assistance Sitting-balance support: Feet supported Sitting balance-Leahy Scale: Fair     Standing balance support: Bilateral upper extremity supported Standing balance-Leahy Scale: Poor                             ADL either performed or assessed with clinical judgement   ADL Overall ADL's : Needs assistance/impaired Eating/Feeding: Set up;Sitting   Grooming: Wash/dry hands;Wash/dry face;Supervision/safety;Set up;Sitting   Upper Body Bathing: Sitting;Moderate assistance   Lower Body Bathing: Total assistance;Bed level   Upper Body Dressing : Minimal assistance;Sitting   Lower Body Dressing: Total assistance;Bed level       Toileting- Clothing Manipulation and Hygiene: Total assistance;Sit to/from stand;Bed level               Vision Patient Visual Report: Blurring of vision(blurring in R eye since her CVA)       Perception     Praxis      Pertinent Vitals/Pain Pain Assessment: Faces Faces Pain Scale: Hurts whole lot Pain Location: L hip, back, generalized Pain Descriptors / Indicators: Grimacing;Guarding;Discomfort;Sore Pain Intervention(s): Monitored during session;Repositioned;Limited activity within patient's tolerance     Hand Dominance Right   Extremity/Trunk Assessment Upper Extremity Assessment Upper Extremity Assessment: Generalized weakness   Lower Extremity Assessment Lower Extremity Assessment: Defer to PT evaluation   Cervical / Trunk Assessment Cervical / Trunk Assessment:  Kyphotic;Other exceptions Cervical / Trunk Exceptions: painful   Communication Communication Communication: HOH   Cognition Arousal/Alertness: Awake/alert Behavior During Therapy: WFL for tasks assessed/performed Overall Cognitive Status: No family/caregiver present to determine baseline cognitive  functioning Area of Impairment: Safety/judgement;Problem solving                         Safety/Judgement: Decreased awareness of safety;Decreased awareness of deficits   Problem Solving: Slow processing;Requires verbal cues General Comments: pt is a difficult historian   General Comments       Exercises     Shoulder Instructions      Home Living Family/patient expects to be discharged to:: Private residence Living Arrangements: Alone Available Help at Discharge: Family;Personal care attendant;Available PRN/intermittently Type of Home: Apartment Home Access: Level entry     Home Layout: One level     Bathroom Shower/Tub: Teacher, early years/pre: Standard Bathroom Accessibility: Yes   Home Equipment: Walker - 2 wheels;Cane - single point;Grab bars - tub/shower;Shower seat   Additional Comments: pt stated her medical alert bracelet malfunctioned and she could not get to the base in her bedroom      Prior Functioning/Environment Level of Independence: Needs assistance  Gait / Transfers Assistance Needed: Reports use of RW at home ADL's / Homemaking Assistance Needed: aide supervises bathing and dressing, pt cooks for herself, niece gets her groceries, aide does housekeeping Communication / Swallowing Assistance Needed: HOH          OT Problem List: Decreased strength;Decreased activity tolerance;Impaired balance (sitting and/or standing);Impaired vision/perception;Decreased cognition;Decreased safety awareness;Decreased knowledge of use of DME or AE;Pain      OT Treatment/Interventions: Self-care/ADL training;DME and/or AE instruction;Patient/family education;Balance training;Cognitive remediation/compensation;Therapeutic activities    OT Goals(Current goals can be found in the care plan section) Acute Rehab OT Goals Patient Stated Goal: decrease pain OT Goal Formulation: With patient Time For Goal Achievement: 01/07/20 Potential to Achieve  Goals: Fair ADL Goals Pt Will Perform Grooming: with min assist;standing Pt Will Perform Upper Body Dressing: with set-up;sitting Pt Will Perform Lower Body Dressing: with mod assist;sit to/from stand Pt Will Transfer to Toilet: with min assist;ambulating;bedside commode Pt Will Perform Toileting - Clothing Manipulation and hygiene: with min assist;sit to/from stand Additional ADL Goal #1: Pt will perform bed mobility with min assist in preparation for ADL.  OT Frequency: Min 2X/week   Barriers to D/C: Decreased caregiver support          Co-evaluation PT/OT/SLP Co-Evaluation/Treatment: Yes Reason for Co-Treatment: Complexity of the patient's impairments (multi-system involvement);Necessary to address cognition/behavior during functional activity;For patient/therapist safety;To address functional/ADL transfers   OT goals addressed during session: ADL's and self-care      AM-PAC OT "6 Clicks" Daily Activity     Outcome Measure Help from another person eating meals?: None Help from another person taking care of personal grooming?: A Little Help from another person toileting, which includes using toliet, bedpan, or urinal?: Total Help from another person bathing (including washing, rinsing, drying)?: A Lot Help from another person to put on and taking off regular upper body clothing?: A Little Help from another person to put on and taking off regular lower body clothing?: Total 6 Click Score: 14   End of Session Nurse Communication: Mobility status  Activity Tolerance: Patient limited by pain Patient left: in bed;with call bell/phone within reach;with nursing/sitter in room;with bed alarm set  OT Visit Diagnosis: Pain;Muscle weakness (generalized) (M62.81);Other symptoms and signs involving cognitive function;Unsteadiness  on feet (R26.81)                Time: 6147-0929 OT Time Calculation (min): 36 min Charges:  OT General Charges $OT Visit: 1 Visit OT Evaluation $OT Eval  Moderate Complexity: 1 Mod  Nestor Lewandowsky, OTR/L Acute Rehabilitation Services Pager: [phone] Office: [phone]  Malka So 12/24/2019, 2:08 PM

## 2019-12-24 NOTE — Anesthesia Preprocedure Evaluation (Addendum)
Anesthesia Evaluation  Patient identified by MRN, date of birth, ID band Patient awake    Reviewed: Allergy & Precautions, NPO status , Patient's Chart, lab work & pertinent test results  Airway Mallampati: I  TM Distance: >3 FB Neck ROM: Full    Dental  (+) Dental Advisory Given   Pulmonary COPD, Current Smoker, former smoker,    Pulmonary exam normal        Cardiovascular hypertension, Pt. on medications + Peripheral Vascular Disease and +CHF  Normal cardiovascular exam     Neuro/Psych PSYCHIATRIC DISORDERS Depression TIA   GI/Hepatic   Endo/Other  diabetes, Type 2, Oral Hypoglycemic AgentsHypothyroidism Hyperthyroidism   Renal/GU Renal disease     Musculoskeletal  (+) Arthritis ,   Abdominal   Peds  Hematology  (+) anemia ,   Anesthesia Other Findings   Reproductive/Obstetrics                             Anesthesia Physical  Anesthesia Plan  ASA: III  Anesthesia Plan: MAC   Post-op Pain Management:    Induction: Intravenous  PONV Risk Score and Plan: 2 and Propofol infusion, TIVA and Treatment may vary due to age or medical condition  Airway Management Planned: Mask  Additional Equipment:   Intra-op Plan:   Post-operative Plan:   Informed Consent: I have reviewed the patients History and Physical, chart, labs and discussed the procedure including the risks, benefits and alternatives for the proposed anesthesia with the patient or authorized representative who has indicated his/her understanding and acceptance.     Dental advisory given, Consent reviewed with POA and History available from chart only  Plan Discussed with: CRNA  Anesthesia Plan Comments:        Anesthesia Quick Evaluation

## 2019-12-24 NOTE — Transfer of Care (Signed)
Immediate Anesthesia Transfer of Care Note  Patient: Gina [name]  Procedure(s) Performed: ENTEROSCOPY (N/A ) HOT HEMOSTASIS (ARGON PLASMA COAGULATION/BICAP) (N/A )  Patient Location: Endoscopy Unit  Anesthesia Type:MAC  Level of Consciousness: awake, alert  and oriented  Airway & Oxygen Therapy: Patient Spontanous Breathing and Patient connected to nasal cannula oxygen  Post-op Assessment: Report given to RN and Post -op Vital signs reviewed and stable  Post vital signs: Reviewed and stable  Last Vitals:  Vitals Value Taken Time  BP 179/33 12/24/19 1600  Temp    Pulse 71 12/24/19 1602  Resp 16 12/24/19 1602  SpO2 95 % 12/24/19 1602  Vitals shown include unvalidated device data.  Last Pain:  Vitals:   12/24/19 1430  TempSrc: Oral  PainSc: 0-No pain         Complications: No apparent anesthesia complications

## 2019-12-24 NOTE — Anesthesia Postprocedure Evaluation (Signed)
Anesthesia Post Note  Patient: Gina [name]  Procedure(s) Performed: ENTEROSCOPY (N/A ) HOT HEMOSTASIS (ARGON PLASMA COAGULATION/BICAP) (N/A )     Patient location during evaluation: PACU Anesthesia Type: MAC Level of consciousness: awake and alert Pain management: pain level controlled Vital Signs Assessment: post-procedure vital signs reviewed and stable Respiratory status: spontaneous breathing Cardiovascular status: stable Anesthetic complications: no    Last Vitals:  Vitals:   12/24/19 1615 12/24/19 1634  BP: (!) 183/52 (!) 159/53  Pulse: 73 69  Resp: 15 18  Temp:  36.7 C  SpO2: 96% 93%    Last Pain:  Vitals:   12/24/19 1634  TempSrc: Oral  PainSc:                  Nolon Nations

## 2019-12-24 NOTE — Evaluation (Signed)
Physical Therapy Evaluation Patient Details Name: Gina [name] MRN: [id number] DOB: May 20, 1929 Today's Date: 12/24/2019   History of Present Illness  Pt is an 86 year old female with a history of HFpEF, DM, HTN, PVD, HLD, CKD IIIb, anemia, and breast carcinoma s/p mastectomy, who presents 12/23/18 after being found on the floor where she had been after falling approximately 10 hours prior. Pt unable to walk with L hip pain (xray non revealing), Head CT negative, pt + for anemia and blood in stool.  s/p EGD 12/24/19 results pending.   Clinical Impression  Co session with OT to assess mobility in Gina [name] after falling at home.  She is limited by fear and soreness to just EOB and standing EOB with two person assist.  She will likely need post acute rehab prior to returning home as she is not currently able to walk.  PT to follow acutely for deficits listed below.      Follow Up Recommendations SNF    Equipment Recommendations  3in1 (PT)    Recommendations for Other Services   NA    Precautions / Restrictions Precautions Precautions: Fall Precaution Comments: h/o falls Restrictions Weight Bearing Restrictions: No      Mobility  Bed Mobility Overal bed mobility: Needs Assistance Bed Mobility: Sidelying to Sit;Sit to Supine   Sidelying to sit: +2 for physical assistance;Max assist   Sit to supine: +2 for physical assistance;Total assist   General bed mobility comments: cues for technique, assist for LEs over EOB and to raise trunk, pt attempting to use rail but ineffective, assist for LEs and to guide trunk to return to supine  Transfers Overall transfer level: Needs assistance Equipment used: 2 person hand held assist Transfers: Sit to/from Stand Sit to Stand: Mod assist;+2 physical assistance         General transfer comment: assist to rise and steady, pt requiring maximum encouragement to attempt as she was not able to stand when she was admitted  Ambulation/Gait              General Gait Details: Refused to attempt seemingly some fear (as she tried at home before EMS was called) and some pain.         Balance Overall balance assessment: Needs assistance Sitting-balance support: Feet supported Sitting balance-Leahy Scale: Fair Sitting balance - Comments: Close supervision EOB   Standing balance support: Bilateral upper extremity supported Standing balance-Leahy Scale: Poor Standing balance comment: needs external support in standing.                              Pertinent Vitals/Pain Pain Assessment: Faces Faces Pain Scale: Hurts whole lot Pain Location: L hip, back, generalized Pain Descriptors / Indicators: Grimacing;Guarding;Discomfort;Sore Pain Intervention(s): Limited activity within patient's tolerance;Monitored during session;Repositioned    Home Living Family/patient expects to be discharged to:: Private residence Living Arrangements: Alone Available Help at Discharge: Family;Personal care attendant;Available PRN/intermittently Type of Home: Apartment Home Access: Level entry     Home Layout: One level Home Equipment: Walker - 2 wheels;Cane - single point;Grab bars - tub/shower;Shower seat Additional Comments: pt stated her medical alert bracelet malfunctioned and she could not get to the base in her bedroom    Prior Function Level of Independence: Needs assistance   Gait / Transfers Assistance Needed: Reports use of RW at home  ADL's / Homemaking Assistance Needed: aide supervises bathing and dressing, pt cooks for herself, niece gets her  groceries, aide does housekeeping        Hand Dominance   Dominant Hand: Right    Extremity/Trunk Assessment   Upper Extremity Assessment Upper Extremity Assessment: Generalized weakness    Lower Extremity Assessment Lower Extremity Assessment: LLE deficits/detail LLE Deficits / Details: left leg limited by pain and weaker than right leg.  Cannot lift lift leg  against gravity in the bed. Visible bruising on lateral hip bone LLE: Unable to fully assess due to pain LLE Sensation: history of peripheral neuropathy(reports bil lower legs "tingle" especially when swollen)    Cervical / Trunk Assessment Cervical / Trunk Assessment: Kyphotic;Other exceptions Cervical / Trunk Exceptions: painful  Communication   Communication: HOH  Cognition Arousal/Alertness: Awake/alert Behavior During Therapy: WFL for tasks assessed/performed Overall Cognitive Status: No family/caregiver present to determine baseline cognitive functioning Area of Impairment: Safety/judgement;Problem solving                         Safety/Judgement: Decreased awareness of safety;Decreased awareness of deficits   Problem Solving: Slow processing;Requires verbal cues General Comments: pt is a difficult historian, a bit tangental             Assessment/Plan    PT Assessment Patient needs continued PT services  PT Problem List Decreased strength;Decreased activity tolerance;Decreased balance;Decreased mobility;Decreased cognition;Decreased knowledge of use of DME;Decreased safety awareness;Decreased knowledge of precautions;Pain       PT Treatment Interventions DME instruction;Gait training;Functional mobility training;Therapeutic activities;Therapeutic exercise;Balance training;Patient/family education;Wheelchair mobility training;Modalities    PT Goals (Current goals can be found in the Care Plan section)  Acute Rehab PT Goals Patient Stated Goal: decrease pain, return home PT Goal Formulation: With patient Time For Goal Achievement: 01/07/20 Potential to Achieve Goals: Good    Frequency Min 2X/week        Co-evaluation PT/OT/SLP Co-Evaluation/Treatment: Yes Reason for Co-Treatment: Complexity of the patient's impairments (multi-system involvement);Necessary to address cognition/behavior during functional activity;For patient/therapist safety;To address  functional/ADL transfers PT goals addressed during session: Mobility/safety with mobility;Balance;Strengthening/ROM OT goals addressed during session: ADL's and self-care       AM-PAC PT "6 Clicks" Mobility  Outcome Measure Help needed turning from your back to your side while in a flat bed without using bedrails?: A Lot Help needed moving from lying on your back to sitting on the side of a flat bed without using bedrails?: A Lot Help needed moving to and from a bed to a chair (including a wheelchair)?: A Lot Help needed standing up from a chair using your arms (e.g., wheelchair or bedside chair)?: A Lot Help needed to walk in hospital room?: Total Help needed climbing 3-5 steps with a railing? : Total 6 Click Score: 10    End of Session   Activity Tolerance: Patient limited by pain Patient left: in bed;with call bell/phone within reach;with bed alarm set Nurse Communication: Other (comment)(RN assisting with stand) PT Visit Diagnosis: Muscle weakness (generalized) (M62.81);Difficulty in walking, not elsewhere classified (R26.2);Pain;History of falling (Z91.81) Pain - Right/Left: Left Pain - part of body: Leg    Time: 0174-9449 PT Time Calculation (min) (ACUTE ONLY): 36 min   Charges:       Verdene Lennert, PT, DPT  Acute Rehabilitation [phone] pager #[phone] office     PT Evaluation $PT Eval Moderate Complexity: 1 Mod        12/24/2019, 2:53 PM

## 2019-12-24 NOTE — Anesthesia Procedure Notes (Signed)
Procedure Name: MAC Date/Time: 12/24/2019 3:17 PM Performed by: Alain Marion, CRNA Pre-anesthesia Checklist: Patient identified, Emergency Drugs available, Suction available and Patient being monitored Oxygen Delivery Method: Nasal cannula Placement Confirmation: positive ETCO2
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
## 2019-12-24 NOTE — Plan of Care (Signed)
  Problem: Education: Goal: Knowledge of General Education information will improve Description Including pain rating scale, medication(s)/side effects and non-pharmacologic comfort measures Outcome: Progressing   

## 2019-12-24 NOTE — Progress Notes (Signed)
PROGRESS NOTE    Gina [name]  ZJQ:[id number] DOB: [birth year] DOA: 12/23/2019 PCP: [name], NP   Brief Narrative: Patient is a 86 year old African-American female with history of gastric ulcer/AVM malformation, peripheral vascular disease status post bilateral femoropopliteal bypass, hypertension, hyperlipidemia, diabetes mellitus, CKD stage IV, mesenteric ischemia, Graves' disease, diastolic congestive heart failure who presents to the emergency department for the evaluation of fall. Patient found to be very weak on presentation. No history of loss of consciousness. Chest x-ray showed chronic lung disease with small bilateral pleural effusion. CT head was unremarkable except for generalized cerebral atrophy. Her hemoglobin was 7. FOBT positive. She is on aspirin at home.Suspect upper GI bleed. GI consulted and started on protonix.  Assessment & Plan:   Active Problems:   Symptomatic anemia   Symptomatic anemia: She reported bloody bowel movement about 2 weeks ago.  Presented with weakness. FOBT positive. Hemoglobin in the range of 7. BUN elevated. Possible upper GI bleed. Started on Protonix drip. Currently n.p.o. We will continue to monitor CBC. She was transfused with 1 unit of PRBC and her Hb is currently stable at 11.  GI consulted. Aspirin on hold.Possible plan for EGD today  Left hip/thigh pain: She was found on the floor. As per niece,she cudnt walk .Continue supportive care, pain management. PT/OT consulted. Mild elevated CK level.She uses walker at baseline.  CKD stage IV: Currently kidney function at baseline. Continue to monitor. She is on Lasix 80 mg twice a day at home.  As per the patient, she had lower extremity edema on presentation but it looks much improved today.  Hypokalemia: Supplemented with potassium  History of peripheral vascular disease/mesenteric ischemia: On aspirin at home. Which is on hold  Diastolic CHF: Currently euvolemic. Chronically  elevated BNP. On Lasix at home.  Hypertension: Mildly hypertensive today. Continue current meds.  Hypothyroidism: Continue Synthyroid  Hyperlipidemia: On statin        DVT prophylaxis:SCD Code Status: Full Family Communication: Called niece and discussed on phone on 12/24/19 Disposition Plan: Patient is from home. She needs to be evaluated by PT/OT before discharge planning.Also waiting for possible intervention from GI.   Consultants: GI  Procedures:None  Antimicrobials:  Anti-infectives (From admission, onward)   None      Subjective: Patient seen and examined the bedside this morning.  Currently stable.  Comfortable.  No active bleeding at present.  Objective: Vitals:   12/23/19 2355 12/24/19 0015 12/24/19 0319 12/24/19 0439  BP: (!) 185/63 (!) 178/61 (!) 184/51 (!) 162/56  Pulse: 75 69 71 67  Resp: 17 18 16 16   Temp: 98.5 F (36.9 C) 97.6 F (36.4 C) 98.6 F (37 C) 98.5 F (36.9 C)  TempSrc: Oral Oral Oral Oral  SpO2: 94% 98% 100% 93%  Weight:        Intake/Output Summary (Last 24 hours) at 12/24/2019 0849 Last data filed at 12/24/2019 2409 Gross per 24 hour  Intake 617.55 ml  Output 1600 ml  Net -982.45 ml   Filed Weights   12/23/19 2051  Weight: 58 kg    Examination:  General exam: Present elderly female Respiratory system: Bilateral equal air entry, normal vesicular breath sounds, no wheezes or crackles  Cardiovascular system: S1 & S2 heard, RRR. No JVD, murmurs, rubs, gallops or clicks. No pedal edema. Gastrointestinal system: Abdomen is nondistended, soft and nontender. No organomegaly or masses felt. Normal bowel sounds heard. Central nervous system: Alert and oriented. No focal neurological deficits. Extremities: No edema, no clubbing ,  no cyanosis, distal peripheral pulses palpable. Skin: No rashes, lesions or ulcers,no icterus ,no pallor.     Data Reviewed: I have personally reviewed following labs and imaging studies  CBC: Recent  Labs  Lab 12/23/19 1313 12/23/19 2050  WBC 5.9  --   HGB 7.0* 7.4*  HCT 22.6* 23.9*  MCV 97.8  --   PLT 299  --    Basic Metabolic Panel: Recent Labs  Lab 12/23/19 1313 12/23/19 2050  NA 140  --   K 3.3*  --   CL 104  --   CO2 24  --   GLUCOSE 131*  --   BUN 53*  --   CREATININE 1.78*  --   CALCIUM 9.0  --   MG  --  2.0  PHOS  --  3.9   GFR: Estimated Creatinine Clearance: 20 mL/min (A) (by C-G formula based on SCr of 1.78 mg/dL (H)). Liver Function Tests: Recent Labs  Lab 12/23/19 1313  AST 22  ALT 14  ALKPHOS 60  BILITOT 0.6  PROT 6.7  ALBUMIN 3.1*   No results for input(s): LIPASE, AMYLASE in the last 168 hours. No results for input(s): AMMONIA in the last 168 hours. Coagulation Profile: Recent Labs  Lab 12/23/19 2050  INR 1.1   Cardiac Enzymes: Recent Labs  Lab 12/23/19 1621  CKTOTAL 358*   BNP (last 3 results) No results for input(s): PROBNP in the last 8760 hours. HbA1C: No results for input(s): HGBA1C in the last 72 hours. CBG: No results for input(s): GLUCAP in the last 168 hours. Lipid Profile: No results for input(s): CHOL, HDL, LDLCALC, TRIG, CHOLHDL, LDLDIRECT in the last 72 hours. Thyroid Function Tests: Recent Labs    12/23/19 2050  TSH 2.269   Anemia Panel: No results for input(s): VITAMINB12, FOLATE, FERRITIN, TIBC, IRON, RETICCTPCT in the last 72 hours. Sepsis Labs: No results for input(s): PROCALCITON, LATICACIDVEN in the last 168 hours.  Recent Results (from the past 240 hour(s))  Respiratory Panel by RT PCR (Flu A&B, Covid) - Nasopharyngeal Swab     Status: None   Collection Time: 12/23/19  4:34 PM   Specimen: Nasopharyngeal Swab  Result Value Ref Range Status   SARS Coronavirus 2 by RT PCR NEGATIVE NEGATIVE Final    Comment: (NOTE) SARS-CoV-2 target nucleic acids are NOT DETECTED. The SARS-CoV-2 RNA is generally detectable in upper respiratoy specimens during the acute phase of infection. The lowest concentration  of SARS-CoV-2 viral copies this assay can detect is 131 copies/mL. A negative result does not preclude SARS-Cov-2 infection and should not be used as the sole basis for treatment or other patient management decisions. A negative result may occur with  improper specimen collection/handling, submission of specimen other than nasopharyngeal swab, presence of viral mutation(s) within the areas targeted by this assay, and inadequate number of viral copies (<131 copies/mL). A negative result must be combined with clinical observations, patient history, and epidemiological information. The expected result is Negative. Fact Sheet for Patients:  PinkCheek.be Fact Sheet for Healthcare Providers:  GravelBags.it This test is not yet ap proved or cleared by the [place] FDA and  has been authorized for detection and/or diagnosis of SARS-CoV-2 by FDA under an Emergency Use Authorization (EUA). This EUA will remain  in effect (meaning this test can be used) for the duration of the COVID-19 declaration under Section 564(b)(1) of the Act, 21 U.S.C. section 360bbb-3(b)(1), unless the authorization is terminated or revoked sooner.    Influenza A  by PCR NEGATIVE NEGATIVE Final   Influenza B by PCR NEGATIVE NEGATIVE Final    Comment: (NOTE) The Xpert Xpress SARS-CoV-2/FLU/RSV assay is intended as an aid in  the diagnosis of influenza from Nasopharyngeal swab specimens and  should not be used as a sole basis for treatment. Nasal washings and  aspirates are unacceptable for Xpert Xpress SARS-CoV-2/FLU/RSV  testing. Fact Sheet for Patients: PinkCheek.be Fact Sheet for Healthcare Providers: GravelBags.it This test is not yet approved or cleared by the [place] FDA and  has been authorized for detection and/or diagnosis of SARS-CoV-2 by  FDA under an Emergency Use Authorization (EUA).  This EUA will remain  in effect (meaning this test can be used) for the duration of the  Covid-19 declaration under Section 564(b)(1) of the Act, 21  U.S.C. section 360bbb-3(b)(1), unless the authorization is  terminated or revoked. Performed at [facility], [place] [address], [place], [place] [address]          Radiology Studies: CT Head Wo Contrast  Result Date: 12/23/2019 CLINICAL DATA:  Status post fall. EXAM: CT HEAD WITHOUT CONTRAST TECHNIQUE: Contiguous axial images were obtained from the base of the skull through the vertex without intravenous contrast. COMPARISON:  October 26, 2019 FINDINGS: Brain: There is mild to moderate severity cerebral atrophy with widening of the extra-axial spaces and ventricular dilatation. There are areas of decreased attenuation within the white matter tracts of the supratentorial brain, consistent with microvascular disease changes. Vascular: No hyperdense vessel or unexpected calcification. Skull: Normal. Negative for fracture or focal lesion. Sinuses/Orbits: A 2.4 cm x 2.0 cm polyp versus mucous retention cyst is seen within the posterior aspect of the left maxillary sinus. This represents a new finding when compared to the prior study. Other: None. IMPRESSION: 1. Generalized cerebral atrophy. 2. No acute intracranial abnormality. 3. Left maxillary sinus polyp versus mucous retention cyst. Electronically Signed   By: Virgina Norfolk M.D.   On: 12/23/2019 19:46   DG Chest Port 1 View  Result Date: 12/23/2019 CLINICAL DATA:  [place]. Weakness. EXAM: PORTABLE CHEST 1 VIEW COMPARISON:  Chest x-ray 10/26/2019 FINDINGS: The heart is mildly enlarged but stable. There is moderate tortuosity and dense calcification of the thoracic aorta. Underlying chronic lung changes with emphysema and pulmonary scarring. Small bilateral pleural effusions but no pulmonary edema or focal pulmonary infiltrates. The bony thorax is intact. No obvious rib fractures. IMPRESSION: 1.  Stable cardiac enlargement. 2. Chronic lung changes with small bilateral pleural effusions. Electronically Signed   By: Marijo Sanes M.D.   On: 12/23/2019 17:26   DG Hip Unilat With Pelvis 2-3 Views Left  Result Date: 12/23/2019 CLINICAL DATA:  Left hip pain post fall EXAM: DG HIP (WITH OR WITHOUT PELVIS) 2-3V LEFT COMPARISON:  2014 FINDINGS: Alignment appears anatomic. There is no acute fracture identified. Degenerative changes are present at both hip joints. Vascular calcification is present. IMPRESSION: No acute fracture of the left hip. Electronically Signed   By: Macy Mis M.D.   On: 12/23/2019 14:10        Scheduled Meds: . amLODipine  10 mg Oral Daily  . atorvastatin  20 mg Oral q1800  . calcium carbonate  500 mg of elemental calcium Oral Daily  . ferrous sulfate  325 mg Oral Q breakfast  . furosemide  80 mg Oral BID  . latanoprost  1 drop Both Eyes QHS  . levothyroxine  112 mcg Oral QAC breakfast  . linaclotide  145 mcg Oral Daily  .  mirtazapine  7.5 mg Oral QHS  . Netarsudil Dimesylate  1 drop Both Eyes QHS  . [START ON 12/27/2019] pantoprazole  40 mg Intravenous Q12H  . vitamin B-12  500 mcg Oral Daily  . vitamin E  400 Units Oral Daily   Continuous Infusions: . sodium chloride    . sodium chloride 250 mL (12/24/19 0332)  . pantoprozole (PROTONIX) infusion 8 mg/hr (12/24/19 0328)  . pantoprazole (PROTONIX) IVPB       LOS: 0 days    Time spent: 35 mins.More than 50% of that time was spent in counseling and/or coordination of care.      Shelly Coss, MD [facility] P3/26/2021, 8:49 AM

## 2019-12-24 NOTE — Op Note (Signed)
[facility] Patient Name: Gina [name] Procedure Date : 12/24/2019 MRN: [id number] Attending MD: Carol Ada , MD Date of Birth: May 30, 1929 CSN: [id number] Age: 86 Admit Type: Inpatient Procedure:                Small bowel enteroscopy Indications:              Iron deficiency anemia, Occult blood in stool Providers:                Carol Ada, MD, Benay Pillow, RN, Lina Sar,                            [facility] CRNA Referring MD:              Medicines:                 Complications:            No immediate complications. Estimated Blood Loss:     Estimated blood loss was minimal. Procedure:                Pre-Anesthesia Assessment:                           - Prior to the procedure, a History and Physical                            was performed, and patient medications and                            allergies were reviewed. The patient's tolerance of                            previous anesthesia was also reviewed. The risks                            and benefits of the procedure and the sedation                            options and risks were discussed with the patient.                            All questions were answered, and informed consent                            was obtained. Prior Anticoagulants: The patient has                            taken no previous anticoagulant or antiplatelet                            agents. ASA Grade Assessment: III - A patient with                            severe systemic disease. After reviewing the risks  and benefits, the patient was deemed in                            satisfactory condition to undergo the procedure.                           - Sedation was administered by an anesthesia                            professional. Deep sedation was attained.                           After obtaining informed consent, the endoscope was                            passed  under direct vision. Throughout the                            procedure, the patient's blood pressure, pulse, and                            oxygen saturations were monitored continuously. The                            [id number]-[id number] ([id number]) Olympus pediatric colonscope                            was introduced through the mouth and advanced to                            the small bowel distal to the Ligament of Treitz.                            The patient tolerated the procedure well. The small                            bowel enteroscopy was technically difficult and                            complex. Scope In: Scope Out: Findings:      The esophagus was normal.      Multiple large angiodysplastic lesions with bleeding were found in the       gastric fundus and in the gastric body. Coagulation for hemostasis using       monopolar probe was successful. Estimated blood loss was minimal.      A single angiodysplastic lesion with no bleeding was found in the       duodenal bulb. Coagulation for tissue destruction using monopolar probe       was successful. Estimated blood loss: none.      Two angiodysplastic lesions with no bleeding were found in the proximal       jejunum. Coagulation for tissue destruction using monopolar probe was       successful.      The patient's gastric lumen exhibited active bleeding. The bleeding was  slow and it was from a combination of AVMs and gastritis. It was       difficult to discern the difference between the etiologies. APC was       applied to the majority of the lesions. In the gastric body and fundus       there was a significant amount of mucus that was difficult to clear with       lavage. This made the procedure technically difficult. Impression:               - Normal esophagus.                           - Multiple bleeding angiodysplastic lesions in the                            stomach. Treated with a monopolar probe.                            - A single non-bleeding angiodysplastic lesion in                            the duodenum. Treated with a monopolar probe.                           - Two non-bleeding angiodysplastic lesions in the                            duodenum. Treated with a monopolar probe.                           - No specimens collected. Recommendation:           - Return patient to hospital ward for ongoing care.                           - Resume regular diet.                           - Follow HGB and transfuse as necessary.                           - Repeat EGD in 2 weeks to reassess and retreat. Procedure Code(s):        --- Professional ---                           [phone], Small intestinal endoscopy, enteroscopy                            beyond second portion of duodenum, not including                            ileum; with ablation of tumor(s), polyp(s), or                            other lesion(s) not amenable to removal by hot  biopsy forceps, bipolar cautery or snare technique                           [id number], 59, Small intestinal endoscopy, enteroscopy                            beyond second portion of duodenum, not including                            ileum; with control of bleeding (eg, injection,                            bipolar cautery, unipolar cautery, laser, heater                            probe, stapler, plasma coagulator) Diagnosis Code(s):        --- Professional ---                           K31.811, Angiodysplasia of stomach and duodenum                            with bleeding                           D50.9, Iron deficiency anemia, unspecified                           R19.5, Other fecal abnormalities CPT copyright 2019 American Medical Association. All rights reserved. The codes documented in this report are preliminary and upon coder review may  be revised to meet current compliance requirements. Carol Ada, MD Carol Ada,  MD 12/24/2019 4:03:47 PM This report has been signed electronically. Number of Addenda: 0

## 2019-12-25 DIAGNOSIS — D649 Anemia, unspecified: Secondary | ICD-10-CM

## 2019-12-25 DIAGNOSIS — K31819 Angiodysplasia of stomach and duodenum without bleeding: Secondary | ICD-10-CM

## 2019-12-25 LAB — CBC WITH DIFFERENTIAL/PLATELET
Abs Immature Granulocytes: 0.02 10*3/uL (ref 0.00–0.07)
Basophils Absolute: 0 10*3/uL (ref 0.0–0.1)
Basophils Relative: 1 %
Eosinophils Absolute: 0.1 10*3/uL (ref 0.0–0.5)
Eosinophils Relative: 1 %
HCT: 31.3 % — ABNORMAL LOW (ref 36.0–46.0)
Hemoglobin: 10 g/dL — ABNORMAL LOW (ref 12.0–15.0)
Immature Granulocytes: 0 %
Lymphocytes Relative: 9 %
Lymphs Abs: 0.8 10*3/uL (ref 0.7–4.0)
MCH: 29.9 pg (ref 26.0–34.0)
MCHC: 31.9 g/dL (ref 30.0–36.0)
MCV: 93.7 fL (ref 80.0–100.0)
Monocytes Absolute: 0.5 10*3/uL (ref 0.1–1.0)
Monocytes Relative: 6 %
Neutro Abs: 6.9 10*3/uL (ref 1.7–7.7)
Neutrophils Relative %: 83 %
Platelets: 290 10*3/uL (ref 150–400)
RBC: 3.34 MIL/uL — ABNORMAL LOW (ref 3.87–5.11)
RDW: 13.7 % (ref 11.5–15.5)
WBC: 8.4 10*3/uL (ref 4.0–10.5)
nRBC: 0 % (ref 0.0–0.2)

## 2019-12-25 LAB — BASIC METABOLIC PANEL
Anion gap: 14 (ref 5–15)
BUN: 49 mg/dL — ABNORMAL HIGH (ref 8–23)
CO2: 25 mmol/L (ref 22–32)
Calcium: 8.6 mg/dL — ABNORMAL LOW (ref 8.9–10.3)
Chloride: 101 mmol/L (ref 98–111)
Creatinine, Ser: 1.67 mg/dL — ABNORMAL HIGH (ref 0.44–1.00)
GFR calc Af Amer: 31 mL/min — ABNORMAL LOW (ref >60)
GFR calc non Af Amer: 27 mL/min — ABNORMAL LOW (ref >60)
Glucose, Bld: 101 mg/dL — ABNORMAL HIGH (ref 70–99)
Potassium: 3.8 mmol/L (ref 3.5–5.1)
Sodium: 140 mmol/L (ref 135–145)

## 2019-12-25 MED ORDER — PANTOPRAZOLE SODIUM 40 MG PO TBEC
40.0000 mg | DELAYED_RELEASE_TABLET | Freq: Every day | ORAL | Status: DC
  Administered 2019-12-26 – 2019-12-28 (×3): 40 mg via ORAL
  Filled 2019-12-25 (×4): qty 1

## 2019-12-25 MED ORDER — HYDRALAZINE HCL 25 MG PO TABS
25.0000 mg | ORAL_TABLET | Freq: Three times a day (TID) | ORAL | Status: DC
  Administered 2019-12-25 – 2019-12-28 (×11): 25 mg via ORAL
  Filled 2019-12-25 (×11): qty 1

## 2019-12-25 MED ORDER — HALOPERIDOL LACTATE 5 MG/ML IJ SOLN
2.5000 mg | Freq: Once | INTRAMUSCULAR | Status: AC
  Administered 2019-12-25: 2.5 mg via INTRAVENOUS
  Filled 2019-12-25: qty 1

## 2019-12-25 NOTE — Progress Notes (Addendum)
[facility] Progress Note Covering service for Dr. Carol Ada   CC:  IDA, heme + stool   Subjective: She was a bit lethargic but arousable. Nursing staff called to bed side and assisted with sitting patient up. Patient did not sleep well last night. She is "tired".  No abdominal pain. No N/V. No BM since small bowel enteroscopy yesterday. No rectal bleeding.   Objective:   S/P small bowel endoscopy 12/24/2019:  Normal esophagus. - Multiple bleeding angiodysplastic lesions in the stomach. Treated with a monopolar probe. - A single non-bleeding angiodysplastic lesion in the duodenum. Treated with a monopolar probe. - Two non-bleeding angiodysplastic lesions in the duodenum. Treated with a monopolar probe. - No specimens collected.  Vital signs in last 24 hours: Temp:  [97.4 F (36.3 C)-98.9 F (37.2 C)] 98.4 F (36.9 C) (03/27 0906) Pulse Rate:  [65-73] 65 (03/27 0906) Resp:  [10-19] 16 (03/27 0906) BP: (157-197)/(33-68) 163/68 (03/27 0906) SpO2:  [93 %-100 %] 93 % (03/27 0906) Weight:  [58 kg] 58 kg (03/26 1430)   General:  Fatigued appearing 86 year old female in NAD, arousable.  Heart: RRR, no murmur.  Pulm:  Breath sounds clear throughout.  Abdomen: Soft, flat. Nontender. + BS x 4  Quads. Positive bowel sounds x 4 quads.  Extremities:  Without edema. Neurologic:  Alert and  oriented x 3. Speech is clear. Moves all extremities.  Psych:  Alert and cooperative. Normal mood and affect.  Intake/Output from previous day: 03/26 0701 - 03/27 0700 In: 749.4 [P.O.:200; I.V.:549.4] Out: 150 [Urine:150] Intake/Output this shift: Total I/O In: 120 [P.O.:120] Out: -   Lab Results: Recent Labs    12/24/19 0745 12/24/19 0745 12/24/19 1039 12/24/19 1732 12/25/19 0713  WBC 5.1  --  4.9  --  8.4  HGB 10.9*   < > 11.0* 10.2* 10.0*  HCT 33.6*   < > 33.9* 31.7* 31.3*  PLT 301  --  306  --  290   < > = values in this interval not displayed.   BMET Recent  Labs    12/23/19 1313 12/24/19 0745 12/25/19 0713  NA 140 143 140  K 3.3* 3.9 3.8  CL 104 107 101  CO2 24 24 25   GLUCOSE 131* 84 101*  BUN 53* 45* 49*  CREATININE 1.78* 1.67* 1.67*  CALCIUM 9.0 9.2 8.6*   LFT Recent Labs    12/23/19 1313  PROT 6.7  ALBUMIN 3.1*  AST 22  ALT 14  ALKPHOS 60  BILITOT 0.6   PT/INR Recent Labs    12/23/19 2050  LABPROT 14.5  INR 1.1   Hepatitis Panel No results for input(s): HEPBSAG, HCVAB, HEPAIGM, HEPBIGM in the last 72 hours.  CT Head Wo Contrast  Result Date: 12/23/2019 CLINICAL DATA:  Status post fall. EXAM: CT HEAD WITHOUT CONTRAST TECHNIQUE: Contiguous axial images were obtained from the base of the skull through the vertex without intravenous contrast. COMPARISON:  October 26, 2019 FINDINGS: Brain: There is mild to moderate severity cerebral atrophy with widening of the extra-axial spaces and ventricular dilatation. There are areas of decreased attenuation within the white matter tracts of the supratentorial brain, consistent with microvascular disease changes. Vascular: No hyperdense vessel or unexpected calcification. Skull: Normal. Negative for fracture or focal lesion. Sinuses/Orbits: A 2.4 cm x 2.0 cm polyp versus mucous retention cyst is seen within the posterior aspect of the left maxillary sinus. This represents a new finding when compared to the prior study.  Other: None. IMPRESSION: 1. Generalized cerebral atrophy. 2. No acute intracranial abnormality. 3. Left maxillary sinus polyp versus mucous retention cyst. Electronically Signed   By: Virgina Norfolk M.D.   On: 12/23/2019 19:46   DG Chest Port 1 View  Result Date: 12/23/2019 CLINICAL DATA:  [place]. Weakness. EXAM: PORTABLE CHEST 1 VIEW COMPARISON:  Chest x-ray 10/26/2019 FINDINGS: The heart is mildly enlarged but stable. There is moderate tortuosity and dense calcification of the thoracic aorta. Underlying chronic lung changes with emphysema and pulmonary scarring. Small  bilateral pleural effusions but no pulmonary edema or focal pulmonary infiltrates. The bony thorax is intact. No obvious rib fractures. IMPRESSION: 1. Stable cardiac enlargement. 2. Chronic lung changes with small bilateral pleural effusions. Electronically Signed   By: Marijo Sanes M.D.   On: 12/23/2019 17:26   DG Hip Unilat With Pelvis 2-3 Views Left  Result Date: 12/23/2019 CLINICAL DATA:  Left hip pain post fall EXAM: DG HIP (WITH OR WITHOUT PELVIS) 2-3V LEFT COMPARISON:  2014 FINDINGS: Alignment appears anatomic. There is no acute fracture identified. Degenerative changes are present at both hip joints. Vascular calcification is present. IMPRESSION: No acute fracture of the left hip. Electronically Signed   By: Macy Mis M.D.   On: 12/23/2019 14:10    Assessment / Plan:  1. 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 Admission Hg 7.0. FOBT +.  Received 1 unit of PRBCs 3/26. S/P small bowel capsule endoscopy 3/26 showed a single angiodysplastic lesion with no bleeding was found in the duodenal bulb and two angiodysplastic lesions with no bleeding were found in the proximal jejunum treated with APC. Hg stable 10.0.  No signs of GI bleeding over night.  -Repeat CBC in am -Repeat EGD in 2 weeks with Dr. Benson Norway -Diet as tolerated -Continue Pantoprazole 8mg /hr infusion for now  2. CKD stage IV. Cr. 1.67.   Further recommendations per Dr. Henrene Pastor     Active Problems:   Symptomatic anemia     LOS: 1 day   Noralyn Pick  12/25/2019, 10:51 AM  GI ATTENDING (covering for Dr. Benson Norway)  Interval history and data reviewed.  Patient seen and examined.  Agree with interval progress note as outlined above.  Patient is stable after undergoing enteroscopy with ablation of AVMs.  Hemoglobin stable.  No  bleeding.  Okay to change pantoprazole to 40 mg by mouth once daily.  She will follow-up as an outpatient with Dr. Benson Norway.  We will sign off.  Docia Chuck. Geri Seminole., M.D. [facility] Division of Gastroenterology

## 2019-12-25 NOTE — Progress Notes (Signed)
PROGRESS NOTE    Gina [name]  CWC:[id number] DOB: 1928-11-16 DOA: 12/23/2019 PCP: [name], NP   Brief Narrative: Patient is a 86 year old African-American female with history of gastric ulcer/AVM malformation, peripheral vascular disease status post bilateral femoropopliteal bypass, hypertension, hyperlipidemia, diabetes mellitus, CKD stage IV, mesenteric ischemia, Graves' disease, diastolic congestive heart failure who presents to the emergency department for the evaluation of fall. Patient found to be very weak on presentation. No history of loss of consciousness. Chest x-ray showed chronic lung disease with small bilateral pleural effusion. CT head was unremarkable except for generalized cerebral atrophy. Her hemoglobin was 7. FOBT positive. She is on aspirin at home.Suspected upper GI bleed. GI consulted and started on protonix. She underwent EGD on 12/24/19 with finding of angiodysplastic lesions.  PT evaluated her and recommended SN facility.  She is medically stable for discharge to skilled facility as soon as bed is available.  Assessment & Plan:   Active Problems:   Symptomatic anemia   Symptomatic anemia/upper GI bleed: She reported bloody bowel movement about 2 weeks ago.  Presented with weakness. FOBT positive. Hemoglobin in the range of 7 on presentation. BUN elevated.Started on Protonix drip. She was transfused with 1 unit of PRBC and her Hb is currently stable in the range of 10. Aspirin has been held. Can resume in a week. EGD on 12/24/2019 showed multiple large angiodysplastic lesions with bleeding in gastric fundus/body , treated with coagulation.  Also found to have angiodysplastic lesion without bleeding in duodenum and jejunum.  Continue oral Protonix for now. GI planning to repeat EGD in 2 weeks.  Left hip/thigh pain: She was found on the floor. As per niece,she cudnt walk .Continue supportive care, pain management. PT/OT consulted and recommended skilled  nursing facility.. Mild elevated CK level.She uses walker at baseline.  CKD stage IV: Currently kidney function at baseline. Continue to monitor. She is on Lasix 80 mg twice a day at home.  As per the patient, she had lower extremity edema on presentation .  Hypokalemia: Supplemented with potassium  History of peripheral vascular disease/mesenteric ischemia: On aspirin at home.  Hold aspirin for a week.  Diastolic CHF: Currently euvolemic. Chronically elevated BNP. On Lasix at home.  Hypertension: Mildly hypertensive today. Continue current meds.  Hypothyroidism: Continue Synthyroid  Hyperlipidemia: On statin        DVT prophylaxis:SCD Code Status: Full Family Communication: Called niece and discussed on phone on 12/25/19 Disposition Plan: Patient is from home.  PT/OT recommended skilled nursing facility.  Medically stable for discharge to [facility] facility as soon as bed is available   Consultants: GI  Procedures:None  Antimicrobials:  Anti-infectives (From admission, onward)   None      Subjective: Patient seen and examined at the bedside this morning.  Hemodynamically stable.  Denies any complaints.  No episodes of any bloody bowel movements after the admission.  Objective: Vitals:   12/24/19 1615 12/24/19 1634 12/24/19 2052 12/25/19 0448  BP: (!) 183/52 (!) 159/53 (!) 165/53 (!) 157/66  Pulse: 73 69 68 72  Resp: 15 18 18 18   Temp:  98 F (36.7 C) (!) 97.4 F (36.3 C) 97.7 F (36.5 C)  TempSrc:  Oral Oral Oral  SpO2: 96% 93% 97% 97%  Weight:      Height:        Intake/Output Summary (Last 24 hours) at 12/25/2019 0821 Last data filed at 12/25/2019 0700 Gross per 24 hour  Intake 749.39 ml  Output 150 ml  Net 599.39 ml   Filed Weights   12/23/19 2051 12/24/19 1430  Weight: 58 kg 58 kg    Examination:  General exam: Pleasant elderly female Respiratory system: Bilateral equal air entry, normal vesicular breath sounds, no wheezes or crackles  Cardiovascular  system: S1 & S2 heard, RRR. No JVD, murmurs, rubs, gallops or clicks. Gastrointestinal system: Abdomen is nondistended, soft and nontender. No organomegaly or masses felt. Normal bowel sounds heard. Central nervous system: Alert and oriented. No focal neurological deficits. Extremities: No edema, no clubbing ,no cyanosis Skin: No rashes, lesions or ulcers,no icterus ,no pallor   Data Reviewed: I have personally reviewed following labs and imaging studies  CBC: Recent Labs  Lab 12/23/19 1313 12/23/19 1313 12/23/19 2050 12/24/19 0745 12/24/19 1039 12/24/19 1732 12/25/19 0713  WBC 5.9  --   --  5.1 4.9  --  8.4  NEUTROABS  --   --   --   --   --   --  6.9  HGB 7.0*   < > 7.4* 10.9* 11.0* 10.2* 10.0*  HCT 22.6*   < > 23.9* 33.6* 33.9* 31.7* 31.3*  MCV 97.8  --   --  93.1 93.6  --  93.7  PLT 299  --   --  301 306  --  290   < > = values in this interval not displayed.   Basic Metabolic Panel: Recent Labs  Lab 12/23/19 1313 12/23/19 2050 12/24/19 0745 12/25/19 0713  NA 140  --  143 140  K 3.3*  --  3.9 3.8  CL 104  --  107 101  CO2 24  --  24 25  GLUCOSE 131*  --  84 101*  BUN 53*  --  45* 49*  CREATININE 1.78*  --  1.67* 1.67*  CALCIUM 9.0  --  9.2 8.6*  MG  --  2.0  --   --   PHOS  --  3.9  --   --    GFR: Estimated Creatinine Clearance: 21.3 mL/min (A) (by C-G formula based on SCr of 1.67 mg/dL (H)). Liver Function Tests: Recent Labs  Lab 12/23/19 1313  AST 22  ALT 14  ALKPHOS 60  BILITOT 0.6  PROT 6.7  ALBUMIN 3.1*   No results for input(s): LIPASE, AMYLASE in the last 168 hours. No results for input(s): AMMONIA in the last 168 hours. Coagulation Profile: Recent Labs  Lab 12/23/19 2050  INR 1.1   Cardiac Enzymes: Recent Labs  Lab 12/23/19 1621 12/24/19 0745  CKTOTAL 358* 262*   BNP (last 3 results) No results for input(s): PROBNP in the last 8760 hours. HbA1C: No results for input(s): HGBA1C in the last 72 hours. CBG: Recent Labs  Lab  12/24/19 1157  GLUCAP 82   Lipid Profile: No results for input(s): CHOL, HDL, LDLCALC, TRIG, CHOLHDL, LDLDIRECT in the last 72 hours. Thyroid Function Tests: Recent Labs    12/23/19 2050  TSH 2.269   Anemia Panel: No results for input(s): VITAMINB12, FOLATE, FERRITIN, TIBC, IRON, RETICCTPCT in the last 72 hours. Sepsis Labs: No results for input(s): PROCALCITON, LATICACIDVEN in the last 168 hours.  Recent Results (from the past 240 hour(s))  Respiratory Panel by RT PCR (Flu A&B, Covid) - Nasopharyngeal Swab     Status: None   Collection Time: 12/23/19  4:34 PM   Specimen: Nasopharyngeal Swab  Result Value Ref Range Status   SARS Coronavirus 2 by RT PCR NEGATIVE NEGATIVE Final    Comment: (NOTE)  SARS-CoV-2 target nucleic acids are NOT DETECTED. The SARS-CoV-2 RNA is generally detectable in upper respiratoy specimens during the acute phase of infection. The lowest concentration of SARS-CoV-2 viral copies this assay can detect is 131 copies/mL. A negative result does not preclude SARS-Cov-2 infection and should not be used as the sole basis for treatment or other patient management decisions. A negative result may occur with  improper specimen collection/handling, submission of specimen other than nasopharyngeal swab, presence of viral mutation(s) within the areas targeted by this assay, and inadequate number of viral copies (<131 copies/mL). A negative result must be combined with clinical observations, patient history, and epidemiological information. The expected result is Negative. Fact Sheet for Patients:  PinkCheek.be Fact Sheet for Healthcare Providers:  GravelBags.it This test is not yet ap proved or cleared by the [place] FDA and  has been authorized for detection and/or diagnosis of SARS-CoV-2 by FDA under an Emergency Use Authorization (EUA). This EUA will remain  in effect (meaning this test can be  used) for the duration of the COVID-19 declaration under Section 564(b)(1) of the Act, 21 U.S.C. section 360bbb-3(b)(1), unless the authorization is terminated or revoked sooner.    Influenza A by PCR NEGATIVE NEGATIVE Final   Influenza B by PCR NEGATIVE NEGATIVE Final    Comment: (NOTE) The Xpert Xpress SARS-CoV-2/FLU/RSV assay is intended as an aid in  the diagnosis of influenza from Nasopharyngeal swab specimens and  should not be used as a sole basis for treatment. Nasal washings and  aspirates are unacceptable for Xpert Xpress SARS-CoV-2/FLU/RSV  testing. Fact Sheet for Patients: PinkCheek.be Fact Sheet for Healthcare Providers: GravelBags.it This test is not yet approved or cleared by the [place] FDA and  has been authorized for detection and/or diagnosis of SARS-CoV-2 by  FDA under an Emergency Use Authorization (EUA). This EUA will remain  in effect (meaning this test can be used) for the duration of the  Covid-19 declaration under Section 564(b)(1) of the Act, 21  U.S.C. section 360bbb-3(b)(1), unless the authorization is  terminated or revoked. Performed at [facility], [place] [address], [place], [place] [address]          Radiology Studies: CT Head Wo Contrast  Result Date: 12/23/2019 CLINICAL DATA:  Status post fall. EXAM: CT HEAD WITHOUT CONTRAST TECHNIQUE: Contiguous axial images were obtained from the base of the skull through the vertex without intravenous contrast. COMPARISON:  October 26, 2019 FINDINGS: Brain: There is mild to moderate severity cerebral atrophy with widening of the extra-axial spaces and ventricular dilatation. There are areas of decreased attenuation within the white matter tracts of the supratentorial brain, consistent with microvascular disease changes. Vascular: No hyperdense vessel or unexpected calcification. Skull: Normal. Negative for fracture or focal lesion.  Sinuses/Orbits: A 2.4 cm x 2.0 cm polyp versus mucous retention cyst is seen within the posterior aspect of the left maxillary sinus. This represents a new finding when compared to the prior study. Other: None. IMPRESSION: 1. Generalized cerebral atrophy. 2. No acute intracranial abnormality. 3. Left maxillary sinus polyp versus mucous retention cyst. Electronically Signed   By: Virgina Norfolk M.D.   On: 12/23/2019 19:46   DG Chest Port 1 View  Result Date: 12/23/2019 CLINICAL DATA:  [place]. Weakness. EXAM: PORTABLE CHEST 1 VIEW COMPARISON:  Chest x-ray 10/26/2019 FINDINGS: The heart is mildly enlarged but stable. There is moderate tortuosity and dense calcification of the thoracic aorta. Underlying chronic lung changes with emphysema and pulmonary scarring. Small bilateral  pleural effusions but no pulmonary edema or focal pulmonary infiltrates. The bony thorax is intact. No obvious rib fractures. IMPRESSION: 1. Stable cardiac enlargement. 2. Chronic lung changes with small bilateral pleural effusions. Electronically Signed   By: Marijo Sanes M.D.   On: 12/23/2019 17:26   DG Hip Unilat With Pelvis 2-3 Views Left  Result Date: 12/23/2019 CLINICAL DATA:  Left hip pain post fall EXAM: DG HIP (WITH OR WITHOUT PELVIS) 2-3V LEFT COMPARISON:  2014 FINDINGS: Alignment appears anatomic. There is no acute fracture identified. Degenerative changes are present at both hip joints. Vascular calcification is present. IMPRESSION: No acute fracture of the left hip. Electronically Signed   By: Macy Mis M.D.   On: 12/23/2019 14:10        Scheduled Meds: . amLODipine  10 mg Oral Daily  . atorvastatin  20 mg Oral q1800  . calcium carbonate  500 mg of elemental calcium Oral Daily  . ferrous sulfate  325 mg Oral Q breakfast  . furosemide  80 mg Oral BID  . latanoprost  1 drop Both Eyes QHS  . levothyroxine  112 mcg Oral QAC breakfast  . linaclotide  145 mcg Oral Daily  . mirtazapine  7.5 mg Oral QHS  .  Netarsudil Dimesylate  1 drop Both Eyes QHS  . [START ON 12/27/2019] pantoprazole  40 mg Intravenous Q12H  . vitamin B-12  500 mcg Oral Daily  . vitamin E  400 Units Oral Daily   Continuous Infusions: . sodium chloride    . sodium chloride 250 mL (12/24/19 0332)  . pantoprozole (PROTONIX) infusion 8 mg/hr (12/25/19 0700)  . pantoprazole (PROTONIX) IVPB       LOS: 1 day    Time spent: 35 mins.More than 50% of that time was spent in counseling and/or coordination of care.      Shelly Coss, MD [facility] P3/27/2021, 8:21 AM

## 2019-12-25 NOTE — Progress Notes (Signed)
Patient refused labs this morning. Education given but patient still refused. Patient is confused at this time and B[place], NP notified. Orders received in Epic. Will continue to monitor.   Milagros Loll, RN.

## 2019-12-25 NOTE — Plan of Care (Signed)
  Problem: Education: Goal: Knowledge of General Education information will improve Description Including pain rating scale, medication(s)/side effects and non-pharmacologic comfort measures Outcome: Progressing   

## 2019-12-25 NOTE — Plan of Care (Signed)
  Problem: Health Behavior/Discharge Planning: Goal: Ability to manage health-related needs will improve Outcome: Progressing   Problem: Coping: Goal: Level of anxiety will decrease Outcome: Progressing   

## 2019-12-26 LAB — CBC WITH DIFFERENTIAL/PLATELET
Abs Immature Granulocytes: 0.03 10*3/uL (ref 0.00–0.07)
Basophils Absolute: 0.1 10*3/uL (ref 0.0–0.1)
Basophils Relative: 1 %
Eosinophils Absolute: 0.1 10*3/uL (ref 0.0–0.5)
Eosinophils Relative: 2 %
HCT: 30.9 % — ABNORMAL LOW (ref 36.0–46.0)
Hemoglobin: 10 g/dL — ABNORMAL LOW (ref 12.0–15.0)
Immature Granulocytes: 0 %
Lymphocytes Relative: 15 %
Lymphs Abs: 1.3 10*3/uL (ref 0.7–4.0)
MCH: 30.1 pg (ref 26.0–34.0)
MCHC: 32.4 g/dL (ref 30.0–36.0)
MCV: 93.1 fL (ref 80.0–100.0)
Monocytes Absolute: 0.5 10*3/uL (ref 0.1–1.0)
Monocytes Relative: 6 %
Neutro Abs: 6.8 10*3/uL (ref 1.7–7.7)
Neutrophils Relative %: 76 %
Platelets: 308 10*3/uL (ref 150–400)
RBC: 3.32 MIL/uL — ABNORMAL LOW (ref 3.87–5.11)
RDW: 13.5 % (ref 11.5–15.5)
WBC: 8.8 10*3/uL (ref 4.0–10.5)
nRBC: 0 % (ref 0.0–0.2)

## 2019-12-26 MED ORDER — HYDROCODONE-ACETAMINOPHEN 5-325 MG PO TABS
2.0000 | ORAL_TABLET | Freq: Once | ORAL | Status: DC
  Filled 2019-12-26: qty 2

## 2019-12-26 NOTE — TOC Initial Note (Addendum)
Transition of Care [facility]) - Initial/Assessment Note    Patient Details  Name: Gina [name] MRN: [id number] Date of Birth: 1929-05-14  [facility]) CM/SW Contact:    Arvella Merles, [place] Phone Number: 12/26/2019, 5:56 PM  Clinical Narrative:                 CSW received consult of PT recommendation for SNF placement at time of discharge. Considering patient's current orientation to person, time & place, CSW made telephone call to patient's niece [name] who has previously been point of contact for patient. Patient's niece expressed understanding of PT recommendation, is in agreement and provided permission for referrals to be faxed out. Patient's niece stated patient has expressed to her a preference for [facility]. CSW explained insurance authorization process to patient's niece. No further questions reported at this time.  CSW started insurance authorization. Reference #[id number]. CSW will continue to follow and assist with discharge planning needs.   Expected Discharge Plan: Skilled Nursing Facility Barriers to Discharge: Continued Medical Work up, Ship broker   Patient Goals and CMS Choice   CMS Medicare.gov Compare Post Acute Care list provided to:: Patient Represenative (must comment)(Annette, niece) Choice offered to / list presented to : Adult Children  Expected Discharge Plan and Services Expected Discharge Plan: [place] arrangements for the past 2 months: Apartment                                      Prior Living Arrangements/Services Living arrangements for the past 2 months: Apartment Lives with:: Self Patient language and need for interpreter reviewed:: Yes Do you feel safe going back to the place where you live?: Yes      Need for Family Participation in Patient Care: Yes (Comment) Care giver support system in place?: Yes (comment)   Criminal Activity/Legal Involvement Pertinent to  Current Situation/Hospitalization: No - Comment as needed  Activities of Daily Living Home Assistive Devices/Equipment: Bedside commode/3-in-1, Cane (specify quad or straight), Walker (specify type) ADL Screening (condition at time of admission) Patient's cognitive ability adequate to safely complete daily activities?: Yes Is the patient deaf or have difficulty hearing?: No Does the patient have difficulty seeing, even when wearing glasses/contacts?: No Does the patient have difficulty concentrating, remembering, or making decisions?: Yes Patient able to express need for assistance with ADLs?: Yes Does the patient have difficulty dressing or bathing?: Yes Independently performs ADLs?: No Does the patient have difficulty walking or climbing stairs?: Yes Weakness of Legs: Both Weakness of Arms/Hands: None  Permission Sought/Granted Permission sought to share information with : Facility Sport and exercise psychologist, Family Supports    Share Information with NAME: [name]  Permission granted to share info w AGENCY: SNFs  Permission granted to share info w Relationship: Niece  Permission granted to share info w Contact Information: [phone]  Emotional Assessment   Attitude/Demeanor/Rapport: Unable to Assess Affect (typically observed): Unable to Assess Orientation: : Oriented to Self, Oriented to Place, Oriented to  Time   Psych Involvement: No (comment)  Admission diagnosis:  Symptomatic anemia [D64.9] Gastrointestinal hemorrhage, unspecified gastrointestinal hemorrhage type [K92.2] Anemia, unspecified type [D64.9] Patient Active Problem List   Diagnosis Date Noted  . Symptomatic anemia 12/23/2019  . Acute on chronic heart failure with preserved ejection fraction (HFpEF) ([place]) 09/13/2019  . Atrial fibrillation ([place]) 09/13/2019  . Pleural effusion, bilateral 09/12/2019  .  Acute respiratory failure with hypoxia ([place]) 09/12/2019  . Heart failure ([place]) 04/11/2019  . TIA (transient  ischemic attack) 03/26/2019  . PAD (peripheral artery disease) ([place]) 02/03/2019  . Elevated troponin 01/15/2019  . ARF (acute renal failure) ([place]) 01/14/2019  . Normochromic normocytic anemia 01/14/2019  . Hypertensive heart disease with heart failure ([place]) 10/12/2018  . CKD (chronic kidney disease) stage 3, GFR 30-59 ml/min 10/12/2018  . Pseudophakia of both eyes 07/10/2017  . Status post thoracentesis 05/28/2017  . Primary open-angle glaucoma 05/26/2017  . History of AVM (arteriovenous malformation) of duodenum, acquired with hemorrhage 10/24/2016  . Iron deficiency anemia 10/24/2016  . History of duodenal ulcer 10/24/2016  . Fatigue 01/22/2016  . Chronic diastolic congestive heart failure ([place]) 01/22/2016  . Postablative hypothyroidism 07/19/2014  . His of mesenteric ischemia 01/25/2014  . History of gastric ulcer 05/12/2013  . Protein-calorie malnutrition, severe ([place]) 05/07/2013  . Syncope 05/06/2013  . Fall 03/04/2013  . Thyroid nodule 03/04/2013  . Loss of weight 01/28/2013  . Constipation 09/10/2012  . DDD (degenerative disc disease), cervical 07/22/2012  . Hip pain, bilateral 07/26/2011  . Preventative health care 07/26/2011  . PVD (peripheral vascular disease) ([place])   . Diabetes mellitus, type 2 ([place])   . TB lung, latent   . HLD (hyperlipidemia) 05/23/2008  . PULMONARY NODULE 12/29/2007  . Essential hypertension 08/04/2006   PCP:  [name], NP Pharmacy:   [place], [place] - [address] [place] [address] Phone: [phone] Fax: [place], [place] [address] Phone: [phone] Fax: [phone]     Social Determinants of Health (SDOH) Interventions    Readmission Risk Interventions Readmission Risk Prevention Plan 04/20/2019 04/15/2019  Transportation Screening Complete Complete  PCP or Specialist Appt  within 3-5 Days Complete -  HRI or Home Care Consult Complete -  Social Work Consult for [place] Planning/Counseling Complete -  Palliative Care Screening Not Applicable -  Medication Review (RN Care Manager) Complete -  HRI or [place] - Complete  Palliative Care Screening - Not [place] - Not Applicable  Some recent data might be hidden

## 2019-12-26 NOTE — Plan of Care (Signed)
  Problem: Activity: Goal: Risk for activity intolerance will decrease Outcome: Progressing   Problem: Nutrition: Goal: Adequate nutrition will be maintained Outcome: Progressing   

## 2019-12-26 NOTE — Plan of Care (Signed)
  Problem: Safety: Goal: Ability to remain free from injury will improve Outcome: Progressing   

## 2019-12-26 NOTE — NC FL2 (Signed)
[place] LEVEL OF CARE SCREENING TOOL     IDENTIFICATION  Patient Name: Gina [name] Birthdate: [birth year] Sex: female Admission Date (Current Location): 12/23/2019  [facility] [place] Number:  Herbalist and Address:  [facility], [place] [address], [place], [place] [address]      Provider Number: [id number]  Attending Physician Name and Address:  Shelly Coss, MD  Relative Name and Phone Number:       Current Level of Care: Hospital Recommended Level of Care: [place] Prior Approval Number:    Date Approved/Denied:   PASRR Number: [id number]  Discharge Plan: SNF    Current Diagnoses: Patient Active Problem List   Diagnosis Date Noted  . Symptomatic anemia 12/23/2019  . Acute on chronic heart failure with preserved ejection fraction (HFpEF) ([place]) 09/13/2019  . Atrial fibrillation ([place]) 09/13/2019  . Pleural effusion, bilateral 09/12/2019  . Acute respiratory failure with hypoxia ([place]) 09/12/2019  . Heart failure ([place]) 04/11/2019  . TIA (transient ischemic attack) 03/26/2019  . PAD (peripheral artery disease) ([place]) 02/03/2019  . Elevated troponin 01/15/2019  . ARF (acute renal failure) ([place]) 01/14/2019  . Normochromic normocytic anemia 01/14/2019  . Hypertensive heart disease with heart failure ([place]) 10/12/2018  . CKD (chronic kidney disease) stage 3, GFR 30-59 ml/min 10/12/2018  . Pseudophakia of both eyes 07/10/2017  . Status post thoracentesis 05/28/2017  . Primary open-angle glaucoma 05/26/2017  . History of AVM (arteriovenous malformation) of duodenum, acquired with hemorrhage 10/24/2016  . Iron deficiency anemia 10/24/2016  . History of duodenal ulcer 10/24/2016  . Fatigue 01/22/2016  . Chronic diastolic congestive heart failure ([place]) 01/22/2016  . Postablative hypothyroidism 07/19/2014  . His of mesenteric ischemia 01/25/2014  . History of gastric ulcer 05/12/2013  . Protein-calorie  malnutrition, severe ([place]) 05/07/2013  . Syncope 05/06/2013  . Fall 03/04/2013  . Thyroid nodule 03/04/2013  . Loss of weight 01/28/2013  . Constipation 09/10/2012  . DDD (degenerative disc disease), cervical 07/22/2012  . Hip pain, bilateral 07/26/2011  . Preventative health care 07/26/2011  . PVD (peripheral vascular disease) ([place])   . Diabetes mellitus, type 2 ([place])   . TB lung, latent   . HLD (hyperlipidemia) 05/23/2008  . PULMONARY NODULE 12/29/2007  . Essential hypertension 08/04/2006    Orientation RESPIRATION BLADDER Height & Weight     Self, Time, Place  Normal External catheter, Continent Weight: 110 lb (49.9 kg) Height:  5\' 7"  (170.2 cm)  BEHAVIORAL SYMPTOMS/MOOD NEUROLOGICAL BOWEL NUTRITION STATUS      Continent Diet(See discharge summary)  AMBULATORY STATUS COMMUNICATION OF NEEDS Skin   Extensive Assist Verbally Normal                       Personal Care Assistance Level of Assistance  Bathing, Dressing, Feeding Bathing Assistance: Maximum assistance Feeding assistance: Independent Dressing Assistance: Maximum assistance     Functional Limitations Info  Sight, Hearing, Speech Sight Info: Adequate Hearing Info: Adequate Speech Info: Adequate    SPECIAL CARE FACTORS FREQUENCY  PT (By licensed PT), OT (By licensed OT)     PT Frequency: 5x a week OT Frequency: 5x a week            Contractures Contractures Info: Not present    Additional Factors Info  Code Status, Allergies Code Status Info: Full Allergies Info: NKA           Current Medications (12/26/2019):  This is the current  hospital active medication list Current Facility-Administered Medications  Medication Dose Route Frequency Provider Last Rate Last Admin  . 0.9 %  sodium chloride infusion  10 mL/hr Intravenous Once Carol Ada, MD      . 0.9 %  sodium chloride infusion   Intravenous PRN Carol Ada, MD 10 mL/hr at 12/24/19 0332 Restarted at 12/24/19 1546  . albuterol  (PROVENTIL) (2.5 MG/3ML) 0.083% nebulizer solution 3 mL  3 mL Inhalation Q6H PRN Carol Ada, MD      . amLODipine (NORVASC) tablet 10 mg  10 mg Oral Daily Carol Ada, MD   10 mg at 12/26/19 0850  . atorvastatin (LIPITOR) tablet 20 mg  20 mg Oral q1800 Carol Ada, MD   20 mg at 12/26/19 1716  . calcium carbonate (OS-CAL - dosed in mg of elemental calcium) tablet 500 mg of elemental calcium  500 mg of elemental calcium Oral Daily Carol Ada, MD   500 mg of elemental calcium at 12/26/19 0851  . ferrous sulfate tablet 325 mg  325 mg Oral Q breakfast Carol Ada, MD   325 mg at 12/26/19 0851  . furosemide (LASIX) tablet 80 mg  80 mg Oral BID Carol Ada, MD   80 mg at 12/26/19 1716  . hydrALAZINE (APRESOLINE) tablet 25 mg  25 mg Oral Q8H Shelly Coss, MD   25 mg at 12/26/19 1337  . HYDROcodone-acetaminophen (NORCO/VICODIN) 5-325 MG per tablet 2 tablet  2 tablet Oral Once Bodenheimer, Charles A, NP      . latanoprost (XALATAN) 0.005 % ophthalmic solution 1 drop  1 drop Both Eyes QHS Carol Ada, MD   1 drop at 12/25/19 2217  . levothyroxine (SYNTHROID) tablet 112 mcg  112 mcg Oral QAC breakfast Carol Ada, MD   112 mcg at 12/26/19 0530  . linaclotide (LINZESS) capsule 145 mcg  145 mcg Oral Daily Carol Ada, MD   145 mcg at 12/26/19 [phone]  . mirtazapine (REMERON) tablet 7.5 mg  7.5 mg Oral QHS Carol Ada, MD   7.5 mg at 12/25/19 2217  . Netarsudil Dimesylate 0.02 % SOLN 1 drop  1 drop Both Eyes QHS Carol Ada, MD      . pantoprazole (PROTONIX) EC tablet 40 mg  40 mg Oral Daily Shelly Coss, MD   40 mg at 12/26/19 0852  . vitamin B-12 (CYANOCOBALAMIN) tablet 500 mcg  500 mcg Oral Daily Carol Ada, MD   500 mcg at 12/26/19 0851  . vitamin E capsule 400 Units  400 Units Oral Daily Carol Ada, MD   400 Units at 12/26/19 9166     Discharge Medications: Please see discharge summary for a list of discharge medications.  Relevant Imaging Results:  Relevant Lab  Results:   Additional Information [id number]  Neysa Hotter [place], [place]

## 2019-12-26 NOTE — Plan of Care (Signed)
  Problem: Education: Goal: Knowledge of General Education information will improve Description Including pain rating scale, medication(s)/side effects and non-pharmacologic comfort measures Outcome: Progressing   

## 2019-12-26 NOTE — Progress Notes (Signed)
PROGRESS NOTE    Gina [name]  HKV:[id number] DOB: 12-14-1928 DOA: 12/23/2019 PCP: [name], NP   Brief Narrative: Patient is a 86 year old African-American female with history of gastric ulcer/AVM malformation, peripheral vascular disease status post bilateral femoropopliteal bypass, hypertension, hyperlipidemia, diabetes mellitus, CKD stage IV, mesenteric ischemia, Graves' disease, diastolic congestive heart failure who presents to the emergency department for the evaluation of fall. Patient found to be very weak on presentation. No history of loss of consciousness. Chest x-ray showed chronic lung disease with small bilateral pleural effusion. CT head was unremarkable except for generalized cerebral atrophy. Her hemoglobin was 7. FOBT positive. She is on aspirin at home.Suspected upper GI bleed. GI consulted and started on protonix. She underwent EGD on 12/24/19 with finding of angiodysplastic lesions.  PT evaluated her and recommended SN facility.  She is medically stable for discharge to skilled facility as soon as bed is available.  Assessment & Plan:   Active Problems:   Symptomatic anemia   Symptomatic anemia/upper GI bleed: She reported bloody bowel movement about 2 weeks ago.  Presented with weakness. FOBT positive. Hemoglobin in the range of 7 on presentation. BUN elevated.Started on Protonix drip. She was transfused with 1 unit of PRBC and her Hb is currently stable in the range of 10. Aspirin has been held. Can resume in a week. EGD on 12/24/2019 showed multiple large angiodysplastic lesions with bleeding in gastric fundus/body , treated with coagulation.  Also found to have angiodysplastic lesion without bleeding in duodenum and jejunum.  Continue oral Protonix for now. GI planning to repeat EGD in 2 weeks.  Left hip/thigh pain: She was found on the floor. As per niece,she cudnt walk .Continue supportive care, pain management. PT/OT consulted and recommended skilled  nursing facility.. Mild elevated CK level.She uses walker at baseline.  CKD stage IV: Currently kidney function at baseline. Continue to monitor. She is on Lasix 80 mg twice a day at home.  As per the patient, she had lower extremity edema on presentation .  Hypokalemia: Supplemented with potassium  History of peripheral vascular disease/mesenteric ischemia: On aspirin at home.  Hold aspirin for a week.  Diastolic CHF: Currently euvolemic. Chronically elevated BNP. On Lasix at home.  Hypertension: Mildly hypertensive today. Continue current meds.  Hypothyroidism: Continue Synthyroid  Hyperlipidemia: On statin        DVT prophylaxis:SCD Code Status: Full Family Communication: Called niece and discussed on phone on 12/25/19 Disposition Plan: Patient is from home.  PT/OT recommended skilled nursing facility.  Medically stable for discharge to [facility] facility as soon as bed is available   Consultants: GI  Procedures:None  Antimicrobials:  Anti-infectives (From admission, onward)   None      Subjective: Patient seen and examined the bedside this morning.  Medically stable.  Comfortable.  No active issues.  Waiting for placement.  Objective: Vitals:   12/25/19 0906 12/25/19 1843 12/25/19 2121 12/26/19 0517  BP: (!) 163/68 (!) 162/45 (!) 154/41 (!) 157/52  Pulse: 65 65 72 73  Resp: 16 18 20 16   Temp: 98.4 F (36.9 C) 98.6 F (37 C) 98.8 F (37.1 C) 98.9 F (37.2 C)  TempSrc: Oral Oral Oral Oral  SpO2: 93% 92% 94% 93%  Weight:   49.9 kg   Height:        Intake/Output Summary (Last 24 hours) at 12/26/2019 0841 Last data filed at 12/26/2019 0457 Gross per 24 hour  Intake 220 ml  Output 0 ml  Net 220 ml  Filed Weights   12/23/19 2051 12/24/19 1430 12/25/19 2121  Weight: 58 kg 58 kg 49.9 kg    Examination:   General exam: Comfortable Respiratory system:no wheezes or crackles  Cardiovascular system: S1 & S2 heard, RRR. No JVD, murmurs, rubs, gallops or  clicks. Gastrointestinal system: Abdomen is nondistended, soft and nontender. No organomegaly or masses felt. Normal bowel sounds heard. Central nervous system: Alert and oriented. No focal neurological deficits. Extremities: No edema, no clubbing ,no cyanosis Skin: No rashes, lesions or ulcers,no icterus ,no pallor   Data Reviewed: I have personally reviewed following labs and imaging studies  CBC: Recent Labs  Lab 12/23/19 1313 12/23/19 2050 12/24/19 0745 12/24/19 1039 12/24/19 1732 12/25/19 0713 12/26/19 0457  WBC 5.9  --  5.1 4.9  --  8.4 8.8  NEUTROABS  --   --   --   --   --  6.9 6.8  HGB 7.0*   < > 10.9* 11.0* 10.2* 10.0* 10.0*  HCT 22.6*   < > 33.6* 33.9* 31.7* 31.3* 30.9*  MCV 97.8  --  93.1 93.6  --  93.7 93.1  PLT 299  --  301 306  --  290 308   < > = values in this interval not displayed.   Basic Metabolic Panel: Recent Labs  Lab 12/23/19 1313 12/23/19 2050 12/24/19 0745 12/25/19 0713  NA 140  --  143 140  K 3.3*  --  3.9 3.8  CL 104  --  107 101  CO2 24  --  24 25  GLUCOSE 131*  --  84 101*  BUN 53*  --  45* 49*  CREATININE 1.78*  --  1.67* 1.67*  CALCIUM 9.0  --  9.2 8.6*  MG  --  2.0  --   --   PHOS  --  3.9  --   --    GFR: Estimated Creatinine Clearance: 18.3 mL/min (A) (by C-G formula based on SCr of 1.67 mg/dL (H)). Liver Function Tests: Recent Labs  Lab 12/23/19 1313  AST 22  ALT 14  ALKPHOS 60  BILITOT 0.6  PROT 6.7  ALBUMIN 3.1*   No results for input(s): LIPASE, AMYLASE in the last 168 hours. No results for input(s): AMMONIA in the last 168 hours. Coagulation Profile: Recent Labs  Lab 12/23/19 2050  INR 1.1   Cardiac Enzymes: Recent Labs  Lab 12/23/19 1621 12/24/19 0745  CKTOTAL 358* 262*   BNP (last 3 results) No results for input(s): PROBNP in the last 8760 hours. HbA1C: No results for input(s): HGBA1C in the last 72 hours. CBG: Recent Labs  Lab 12/24/19 1157  GLUCAP 82   Lipid Profile: No results for  input(s): CHOL, HDL, LDLCALC, TRIG, CHOLHDL, LDLDIRECT in the last 72 hours. Thyroid Function Tests: Recent Labs    12/23/19 2050  TSH 2.269   Anemia Panel: No results for input(s): VITAMINB12, FOLATE, FERRITIN, TIBC, IRON, RETICCTPCT in the last 72 hours. Sepsis Labs: No results for input(s): PROCALCITON, LATICACIDVEN in the last 168 hours.  Recent Results (from the past 240 hour(s))  Respiratory Panel by RT PCR (Flu A&B, Covid) - Nasopharyngeal Swab     Status: None   Collection Time: 12/23/19  4:34 PM   Specimen: Nasopharyngeal Swab  Result Value Ref Range Status   SARS Coronavirus 2 by RT PCR NEGATIVE NEGATIVE Final    Comment: (NOTE) SARS-CoV-2 target nucleic acids are NOT DETECTED. The SARS-CoV-2 RNA is generally detectable in upper respiratoy specimens during the  acute phase of infection. The lowest concentration of SARS-CoV-2 viral copies this assay can detect is 131 copies/mL. A negative result does not preclude SARS-Cov-2 infection and should not be used as the sole basis for treatment or other patient management decisions. A negative result may occur with  improper specimen collection/handling, submission of specimen other than nasopharyngeal swab, presence of viral mutation(s) within the areas targeted by this assay, and inadequate number of viral copies (<131 copies/mL). A negative result must be combined with clinical observations, patient history, and epidemiological information. The expected result is Negative. Fact Sheet for Patients:  PinkCheek.be Fact Sheet for Healthcare Providers:  GravelBags.it This test is not yet ap proved or cleared by the [place] FDA and  has been authorized for detection and/or diagnosis of SARS-CoV-2 by FDA under an Emergency Use Authorization (EUA). This EUA will remain  in effect (meaning this test can be used) for the duration of the COVID-19 declaration under Section  564(b)(1) of the Act, 21 U.S.C. section 360bbb-3(b)(1), unless the authorization is terminated or revoked sooner.    Influenza A by PCR NEGATIVE NEGATIVE Final   Influenza B by PCR NEGATIVE NEGATIVE Final    Comment: (NOTE) The Xpert Xpress SARS-CoV-2/FLU/RSV assay is intended as an aid in  the diagnosis of influenza from Nasopharyngeal swab specimens and  should not be used as a sole basis for treatment. Nasal washings and  aspirates are unacceptable for Xpert Xpress SARS-CoV-2/FLU/RSV  testing. Fact Sheet for Patients: PinkCheek.be Fact Sheet for Healthcare Providers: GravelBags.it This test is not yet approved or cleared by the [place] FDA and  has been authorized for detection and/or diagnosis of SARS-CoV-2 by  FDA under an Emergency Use Authorization (EUA). This EUA will remain  in effect (meaning this test can be used) for the duration of the  Covid-19 declaration under Section 564(b)(1) of the Act, 21  U.S.C. section 360bbb-3(b)(1), unless the authorization is  terminated or revoked. Performed at [facility], [place] [address] [place], [place] [address]          Radiology Studies: No results found.      Scheduled Meds: . amLODipine  10 mg Oral Daily  . atorvastatin  20 mg Oral q1800  . calcium carbonate  500 mg of elemental calcium Oral Daily  . ferrous sulfate  325 mg Oral Q breakfast  . furosemide  80 mg Oral BID  . hydrALAZINE  25 mg Oral Q8H  . HYDROcodone-acetaminophen  2 tablet Oral Once  . latanoprost  1 drop Both Eyes QHS  . levothyroxine  112 mcg Oral QAC breakfast  . linaclotide  145 mcg Oral Daily  . mirtazapine  7.5 mg Oral QHS  . Netarsudil Dimesylate  1 drop Both Eyes QHS  . pantoprazole  40 mg Oral Daily  . vitamin B-12  500 mcg Oral Daily  . vitamin E  400 Units Oral Daily   Continuous Infusions: . sodium chloride    . sodium chloride 250 mL (12/24/19 0332)     LOS:  2 days    Time spent: 35 mins.More than 50% of that time was spent in counseling and/or coordination of care.      Shelly Coss, MD [facility] P3/28/2021, 8:41 AM

## 2019-12-27 LAB — SARS CORONAVIRUS 2 (TAT 6-24 HRS): SARS Coronavirus 2: NEGATIVE

## 2019-12-27 MED ORDER — HYDROCODONE-ACETAMINOPHEN 5-325 MG PO TABS
1.0000 | ORAL_TABLET | Freq: Four times a day (QID) | ORAL | Status: DC | PRN
  Administered 2019-12-27: 1 via ORAL
  Filled 2019-12-27: qty 1

## 2019-12-27 NOTE — Progress Notes (Signed)
PROGRESS NOTE    Gina [name]  AYT:[id number] DOB: 11/06/1928 DOA: 12/23/2019 PCP: [name], NP   Brief Narrative: Patient is a 86 year old African-American female with history of gastric ulcer/AVM malformation, peripheral vascular disease status post bilateral femoropopliteal bypass, hypertension, hyperlipidemia, diabetes mellitus, CKD stage IV, mesenteric ischemia, Graves' disease, diastolic congestive heart failure who presents to the emergency department for the evaluation of fall. Patient found to be very weak on presentation. No history of loss of consciousness. Chest x-ray showed chronic lung disease with small bilateral pleural effusion. CT head was unremarkable except for generalized cerebral atrophy. Her hemoglobin was 7. FOBT positive. She is on aspirin at home.Suspected upper GI bleed. GI consulted and started on protonix. She underwent EGD on 12/24/19 with finding of angiodysplastic lesions.  PT evaluated her and recommended SN facility.  She is medically stable for discharge to skilled facility as soon as bed is available.  Assessment & Plan:   Active Problems:   Symptomatic anemia   Symptomatic anemia/upper GI bleed: She reported bloody bowel movement about 2 weeks ago.  Presented with weakness. FOBT positive. Hemoglobin in the range of 7 on presentation. BUN elevated.Started on Protonix drip. She was transfused with 1 unit of PRBC and her Hb is currently stable in the range of 10. Aspirin has been held. Can resume in a week. EGD on 12/24/2019 showed multiple large angiodysplastic lesions with bleeding in gastric fundus/body , treated with coagulation.  Also found to have angiodysplastic lesion without bleeding in duodenum and jejunum.  Continue oral Protonix for now. GI planning to repeat EGD in 2 weeks.  Left hip/thigh pain: She was found on the floor. As per niece,she cudnt walk .Continue supportive care, pain management. PT/OT consulted and recommended skilled  nursing facility.. Mild elevated CK level.She uses walker at baseline.  CKD stage IV: Currently kidney function at baseline. Continue to monitor. She is on Lasix 80 mg twice a day at home.  As per the patient, she had lower extremity edema on presentation .  Hypokalemia: Supplemented with potassium  History of peripheral vascular disease/mesenteric ischemia: On aspirin at home.  Hold aspirin for a week.  Diastolic CHF: Currently euvolemic. Chronically elevated BNP. On Lasix at home.  Hypertension: Mildly hypertensive today. Continue current meds.  Hypothyroidism: Continue Synthyroid  Hyperlipidemia: On statin        DVT prophylaxis:SCD Code Status: Full Family Communication: Called niece and discussed on phone on 12/25/19 Disposition Plan: Patient is from home.  PT/OT recommended skilled nursing facility.  Medically stable for discharge to [facility] facility as soon as bed is available   Consultants: GI  Procedures:None  Antimicrobials:  Anti-infectives (From admission, onward)   None      Subjective: Patient seen and examined at the bedside this morning.  Hemodynamically  stable.  Comfortable.  Denies any complaints.  She wants to go to get for healthcare.  Objective: Vitals:   12/26/19 0848 12/26/19 1334 12/26/19 2051 12/27/19 0502  BP: (!) 159/44 (!) 161/40 (!) 160/56 (!) 155/64  Pulse: 79 67 68 81  Resp: 16 18 16 17   Temp: 99 F (37.2 C) 98.7 F (37.1 C) 98.8 F (37.1 C) 98.3 F (36.8 C)  TempSrc: Oral Oral Oral   SpO2: 92% 93% 94% 96%  Weight:   49.1 kg   Height:        Intake/Output Summary (Last 24 hours) at 12/27/2019 3557 Last data filed at 12/27/2019 0502 Gross per 24 hour  Intake 120 ml  Output 1020 ml  Net -900 ml   Filed Weights   12/24/19 1430 12/25/19 2121 12/26/19 2051  Weight: 58 kg 49.9 kg 49.1 kg    Examination:  General exam: Comfortable, pleasant elderly female Respiratory system: No wheezes or crackles  Cardiovascular system: S1 & S2  heard, RRR. No JVD, murmurs, rubs, gallops or clicks. Gastrointestinal system: Abdomen is nondistended, soft and nontender. Central nervous system: Alert and oriented. No focal neurological deficits. Extremities: No edema, no clubbing ,no cyanosis Skin: No rashes, lesions or ulcers,no icterus ,no pallor   Data Reviewed: I have personally reviewed following labs and imaging studies  CBC: Recent Labs  Lab 12/23/19 1313 12/23/19 2050 12/24/19 0745 12/24/19 1039 12/24/19 1732 12/25/19 0713 12/26/19 0457  WBC 5.9  --  5.1 4.9  --  8.4 8.8  NEUTROABS  --   --   --   --   --  6.9 6.8  HGB 7.0*   < > 10.9* 11.0* 10.2* 10.0* 10.0*  HCT 22.6*   < > 33.6* 33.9* 31.7* 31.3* 30.9*  MCV 97.8  --  93.1 93.6  --  93.7 93.1  PLT 299  --  301 306  --  290 308   < > = values in this interval not displayed.   Basic Metabolic Panel: Recent Labs  Lab 12/23/19 1313 12/23/19 2050 12/24/19 0745 12/25/19 0713  NA 140  --  143 140  K 3.3*  --  3.9 3.8  CL 104  --  107 101  CO2 24  --  24 25  GLUCOSE 131*  --  84 101*  BUN 53*  --  45* 49*  CREATININE 1.78*  --  1.67* 1.67*  CALCIUM 9.0  --  9.2 8.6*  MG  --  2.0  --   --   PHOS  --  3.9  --   --    GFR: Estimated Creatinine Clearance: 18 mL/min (A) (by C-G formula based on SCr of 1.67 mg/dL (H)). Liver Function Tests: Recent Labs  Lab 12/23/19 1313  AST 22  ALT 14  ALKPHOS 60  BILITOT 0.6  PROT 6.7  ALBUMIN 3.1*   No results for input(s): LIPASE, AMYLASE in the last 168 hours. No results for input(s): AMMONIA in the last 168 hours. Coagulation Profile: Recent Labs  Lab 12/23/19 2050  INR 1.1   Cardiac Enzymes: Recent Labs  Lab 12/23/19 1621 12/24/19 0745  CKTOTAL 358* 262*   BNP (last 3 results) No results for input(s): PROBNP in the last 8760 hours. HbA1C: No results for input(s): HGBA1C in the last 72 hours. CBG: Recent Labs  Lab 12/24/19 1157  GLUCAP 82   Lipid Profile: No results for input(s): CHOL, HDL,  LDLCALC, TRIG, CHOLHDL, LDLDIRECT in the last 72 hours. Thyroid Function Tests: No results for input(s): TSH, T4TOTAL, FREET4, T3FREE, THYROIDAB in the last 72 hours. Anemia Panel: No results for input(s): VITAMINB12, FOLATE, FERRITIN, TIBC, IRON, RETICCTPCT in the last 72 hours. Sepsis Labs: No results for input(s): PROCALCITON, LATICACIDVEN in the last 168 hours.  Recent Results (from the past 240 hour(s))  Respiratory Panel by RT PCR (Flu A&B, Covid) - Nasopharyngeal Swab     Status: None   Collection Time: 12/23/19  4:34 PM   Specimen: Nasopharyngeal Swab  Result Value Ref Range Status   SARS Coronavirus 2 by RT PCR NEGATIVE NEGATIVE Final    Comment: (NOTE) SARS-CoV-2 target nucleic acids are NOT DETECTED. The SARS-CoV-2 RNA is generally detectable in  upper respiratoy specimens during the acute phase of infection. The lowest concentration of SARS-CoV-2 viral copies this assay can detect is 131 copies/mL. A negative result does not preclude SARS-Cov-2 infection and should not be used as the sole basis for treatment or other patient management decisions. A negative result may occur with  improper specimen collection/handling, submission of specimen other than nasopharyngeal swab, presence of viral mutation(s) within the areas targeted by this assay, and inadequate number of viral copies (<131 copies/mL). A negative result must be combined with clinical observations, patient history, and epidemiological information. The expected result is Negative. Fact Sheet for Patients:  PinkCheek.be Fact Sheet for Healthcare Providers:  GravelBags.it This test is not yet ap proved or cleared by the [place] FDA and  has been authorized for detection and/or diagnosis of SARS-CoV-2 by FDA under an Emergency Use Authorization (EUA). This EUA will remain  in effect (meaning this test can be used) for the duration of the COVID-19  declaration under Section 564(b)(1) of the Act, 21 U.S.C. section 360bbb-3(b)(1), unless the authorization is terminated or revoked sooner.    Influenza A by PCR NEGATIVE NEGATIVE Final   Influenza B by PCR NEGATIVE NEGATIVE Final    Comment: (NOTE) The Xpert Xpress SARS-CoV-2/FLU/RSV assay is intended as an aid in  the diagnosis of influenza from Nasopharyngeal swab specimens and  should not be used as a sole basis for treatment. Nasal washings and  aspirates are unacceptable for Xpert Xpress SARS-CoV-2/FLU/RSV  testing. Fact Sheet for Patients: PinkCheek.be Fact Sheet for Healthcare Providers: GravelBags.it This test is not yet approved or cleared by the [place] FDA and  has been authorized for detection and/or diagnosis of SARS-CoV-2 by  FDA under an Emergency Use Authorization (EUA). This EUA will remain  in effect (meaning this test can be used) for the duration of the  Covid-19 declaration under Section 564(b)(1) of the Act, 21  U.S.C. section 360bbb-3(b)(1), unless the authorization is  terminated or revoked. Performed at [facility], [place] [address] [place], [place] [address]          Radiology Studies: No results found.      Scheduled Meds: . amLODipine  10 mg Oral Daily  . atorvastatin  20 mg Oral q1800  . calcium carbonate  500 mg of elemental calcium Oral Daily  . ferrous sulfate  325 mg Oral Q breakfast  . furosemide  80 mg Oral BID  . hydrALAZINE  25 mg Oral Q8H  . latanoprost  1 drop Both Eyes QHS  . levothyroxine  112 mcg Oral QAC breakfast  . linaclotide  145 mcg Oral Daily  . mirtazapine  7.5 mg Oral QHS  . pantoprazole  40 mg Oral Daily  . vitamin B-12  500 mcg Oral Daily  . vitamin E  400 Units Oral Daily   Continuous Infusions: . sodium chloride    . sodium chloride 250 mL (12/24/19 0332)     LOS: 3 days    Time spent: 35 mins.More than 50% of that time was spent in  counseling and/or coordination of care.      Shelly Coss, MD [facility] P3/29/2021, 8:33 AM

## 2019-12-27 NOTE — Social Work (Signed)
Insurance authorization received. Authorization #[id number]. Good 3/29 thru 3/31.

## 2019-12-28 MED ORDER — PANTOPRAZOLE SODIUM 40 MG PO TBEC: 40.0000 mg | DELAYED_RELEASE_TABLET | Freq: Every day | ORAL | 1 refills | Status: DC

## 2019-12-28 MED ORDER — HYDRALAZINE HCL 25 MG PO TABS: 25.0000 mg | ORAL_TABLET | Freq: Three times a day (TID) | ORAL | Status: DC

## 2019-12-28 MED ORDER — POTASSIUM CHLORIDE ER 20 MEQ PO TBCR: 40.0000 meq | EXTENDED_RELEASE_TABLET | Freq: Every day | ORAL | Status: DC

## 2019-12-28 NOTE — Discharge Summary (Signed)
Physician Discharge Summary  Gina [name] YSA:[id number] DOB: 03-10-1929 DOA: 12/23/2019  PCP: [name], NP  Admit date: 12/23/2019 Discharge date: 12/28/2019  Admitted From: Home Disposition:  SNF  Discharge Condition:Stable CODE STATUS:FULL Diet recommendation: Heart Healthy   Brief/Interim Summary:  Patient is a 86 year old African-American female with history of gastric ulcer/AVM malformation, peripheral vascular disease status post bilateral femoropopliteal bypass, hypertension, hyperlipidemia, diabetes mellitus, CKD stage IV, mesenteric ischemia, Graves' disease, diastolic congestive heart failure who presents to the emergency department for the evaluation of fall. Patient found to be very weak on presentation. No history of loss of consciousness. Chest x-ray showed chronic lung disease with small bilateral pleural effusion. CT head was unremarkable except for generalized cerebral atrophy. Her hemoglobin was 7. FOBT positive. She is on aspirin at home.Suspected upper GI bleed. GI consulted and started on protonix. She underwent EGD on 12/24/19 with finding of angiodysplastic lesions.  PT evaluated her and recommended SN facility.  She is medically stable for discharge to skilled facility .  Following problems were addressed during her hospitalization:  Symptomatic anemia/upper GI bleed: She reported bloody bowel movement about 2 weeks ago.  Presented with weakness. FOBT positive. Hemoglobin in the range of 7 on presentation. BUN elevated.Started on Protonix drip. She was transfused with 1 unit of PRBC and her Hb is currently stable in the range of 10. Aspirin has been held.  EGD on 12/24/2019 showed multiple large angiodysplastic lesions with bleeding in gastric fundus/body , treated with coagulation.  Also found to have angiodysplastic lesion without bleeding in duodenum and jejunum.  Continue oral Protonix for now. GI planning to repeat EGD in 2 weeks.  Left hip/thigh  pain: She was found on the floor. As per niece,she cudnt walk .Continue supportive care, pain management. PT/OT consulted and recommended skilled nursing facility.. Mild elevated CK level.She uses walker at baseline.  CKD stage IV: Currently kidney function at baseline. Continue to monitor. She is on Lasix 80 mg twice a day at home.   Hypokalemia: Supplemented with potassium  History of peripheral vascular disease/mesenteric ischemia: On aspirin at home.  Hold aspirin for a week.  Diastolic CHF: Currently euvolemic. Chronically elevated BNP. On Lasix at home.  Hypertension:  Continue current meds.  Hypothyroidism: Continue Synthyroid  Hyperlipidemia: On statin  Discharge Diagnoses:  Active Problems:   Symptomatic anemia    Discharge Instructions  Discharge Instructions    Diet - low sodium heart healthy   Complete by: As directed    Discharge instructions   Complete by: As directed    1)Take prescribed medications as instructed. 2)Do a CBC and BMP tests in a week. 3)You will be called by gastroenterology office for the follow up appointment.   Increase activity slowly   Complete by: As directed      Allergies as of 12/28/2019   No Known Allergies     Medication List    STOP taking these medications   aspirin EC 81 MG tablet     TAKE these medications   albuterol 108 (90 Base) MCG/ACT inhaler Commonly known as: VENTOLIN HFA Inhale 2 puffs into the lungs every 6 (six) hours as needed for wheezing or shortness of breath.   amLODipine 10 MG tablet Commonly known as: NORVASC Take 1 tablet (10 mg total) by mouth daily.   atorvastatin 20 MG tablet Commonly known as: LIPITOR Take 1 tablet (20 mg total) by mouth daily at 6 PM.   bimatoprost 0.01 % Soln Commonly known as: LUMIGAN  Place 1 drop into both eyes at bedtime.   calcium carbonate 1500 (600 Ca) MG Tabs tablet Commonly known as: OSCAL Take 1 tablet (1,500 mg total) by mouth daily.   ferrous  sulfate 325 (65 FE) MG EC tablet Take 1 tablet (325 mg total) by mouth daily.   furosemide 80 MG tablet Commonly known as: LASIX Take 1 tablet (80 mg total) by mouth 2 (two) times daily.   hydrALAZINE 25 MG tablet Commonly known as: APRESOLINE Take 1 tablet (25 mg total) by mouth every 8 (eight) hours.   latanoprost 0.005 % ophthalmic solution Commonly known as: XALATAN Place 1 drop into both eyes at bedtime.   levothyroxine 112 MCG tablet Commonly known as: SYNTHROID Take 1 tablet (112 mcg total) by mouth daily.   Linzess 145 MCG Caps capsule Generic drug: linaclotide Take 1 capsule (145 mcg total) by mouth daily before breakfast. What changed: See the new instructions.   mirtazapine 15 MG tablet Commonly known as: REMERON Take 0.5 tablets (7.5 mg total) by mouth at bedtime.   pantoprazole 40 MG tablet Commonly known as: PROTONIX Take 1 tablet (40 mg total) by mouth daily. Start taking on: December 29, 2019   Potassium Chloride ER 20 MEQ Tbcr Take 40 mEq by mouth daily.   Rhopressa 0.02 % Soln Generic drug: Netarsudil Dimesylate Place 1 drop into both eyes at bedtime.   vitamin B-12 500 MCG tablet Commonly known as: CYANOCOBALAMIN Take 500 mcg by mouth daily.   vitamin E 180 MG (400 UNITS) capsule Take 400 Units by mouth daily.      Contact information for after-discharge care    Destination    [facility] Preferred SNF .   Service: Skilled Nursing Contact information: [address] [place] [phone]             No Known Allergies  Consultations:  None   Procedures/Studies: CT Head Wo Contrast  Result Date: 12/23/2019 CLINICAL DATA:  Status post fall. EXAM: CT HEAD WITHOUT CONTRAST TECHNIQUE: Contiguous axial images were obtained from the base of the skull through the vertex without intravenous contrast. COMPARISON:  October 26, 2019 FINDINGS: Brain: There is mild to moderate severity cerebral atrophy  with widening of the extra-axial spaces and ventricular dilatation. There are areas of decreased attenuation within the white matter tracts of the supratentorial brain, consistent with microvascular disease changes. Vascular: No hyperdense vessel or unexpected calcification. Skull: Normal. Negative for fracture or focal lesion. Sinuses/Orbits: A 2.4 cm x 2.0 cm polyp versus mucous retention cyst is seen within the posterior aspect of the left maxillary sinus. This represents a new finding when compared to the prior study. Other: None. IMPRESSION: 1. Generalized cerebral atrophy. 2. No acute intracranial abnormality. 3. Left maxillary sinus polyp versus mucous retention cyst. Electronically Signed   By: Virgina Norfolk M.D.   On: 12/23/2019 19:46   DG Chest Port 1 View  Result Date: 12/23/2019 CLINICAL DATA:  [place]. Weakness. EXAM: PORTABLE CHEST 1 VIEW COMPARISON:  Chest x-ray 10/26/2019 FINDINGS: The heart is mildly enlarged but stable. There is moderate tortuosity and dense calcification of the thoracic aorta. Underlying chronic lung changes with emphysema and pulmonary scarring. Small bilateral pleural effusions but no pulmonary edema or focal pulmonary infiltrates. The bony thorax is intact. No obvious rib fractures. IMPRESSION: 1. Stable cardiac enlargement. 2. Chronic lung changes with small bilateral pleural effusions. Electronically Signed   By: Marijo Sanes M.D.   On: 12/23/2019 17:26  DG Hip Unilat With Pelvis 2-3 Views Left  Result Date: 12/23/2019 CLINICAL DATA:  Left hip pain post fall EXAM: DG HIP (WITH OR WITHOUT PELVIS) 2-3V LEFT COMPARISON:  2014 FINDINGS: Alignment appears anatomic. There is no acute fracture identified. Degenerative changes are present at both hip joints. Vascular calcification is present. IMPRESSION: No acute fracture of the left hip. Electronically Signed   By: Macy Mis M.D.   On: 12/23/2019 14:10      Subjective: Patient seen and examined at the bedside  this morning.  Medically stable for discharge today.  Discharge Exam: Vitals:   12/28/19 0825 12/28/19 0908  BP: (!) 162/32 (!) 156/46  Pulse: 85 83  Resp: 16 18  Temp: 98.4 F (36.9 C)   SpO2: 94% 93%   Vitals:   12/27/19 2108 12/28/19 0446 12/28/19 0825 12/28/19 0908  BP: (!) 155/71 (!) 158/40 (!) 162/32 (!) 156/46  Pulse: 84 81 85 83  Resp: 16 18 16 18   Temp: 98.7 F (37.1 C) 99.5 F (37.5 C) 98.4 F (36.9 C)   TempSrc: Oral Oral Oral   SpO2: 92% 92% 94% 93%  Weight: 49 kg     Height:        General: Pt is alert, awake, not in acute distress Cardiovascular: RRR, S1/S2 +, no rubs, no gallops Respiratory: CTA bilaterally, no wheezing, no rhonchi Abdominal: Soft, NT, ND, bowel sounds + Extremities: no edema, no cyanosis    The results of significant diagnostics from this hospitalization (including imaging, microbiology, ancillary and laboratory) are listed below for reference.     Microbiology: Recent Results (from the past 240 hour(s))  Respiratory Panel by RT PCR (Flu A&B, Covid) - Nasopharyngeal Swab     Status: None   Collection Time: 12/23/19  4:34 PM   Specimen: Nasopharyngeal Swab  Result Value Ref Range Status   SARS Coronavirus 2 by RT PCR NEGATIVE NEGATIVE Final    Comment: (NOTE) SARS-CoV-2 target nucleic acids are NOT DETECTED. The SARS-CoV-2 RNA is generally detectable in upper respiratoy specimens during the acute phase of infection. The lowest concentration of SARS-CoV-2 viral copies this assay can detect is 131 copies/mL. A negative result does not preclude SARS-Cov-2 infection and should not be used as the sole basis for treatment or other patient management decisions. A negative result may occur with  improper specimen collection/handling, submission of specimen other than nasopharyngeal swab, presence of viral mutation(s) within the areas targeted by this assay, and inadequate number of viral copies (<131 copies/mL). A negative result must be  combined with clinical observations, patient history, and epidemiological information. The expected result is Negative. Fact Sheet for Patients:  PinkCheek.be Fact Sheet for Healthcare Providers:  GravelBags.it This test is not yet ap proved or cleared by the [place] FDA and  has been authorized for detection and/or diagnosis of SARS-CoV-2 by FDA under an Emergency Use Authorization (EUA). This EUA will remain  in effect (meaning this test can be used) for the duration of the COVID-19 declaration under Section 564(b)(1) of the Act, 21 U.S.C. section 360bbb-3(b)(1), unless the authorization is terminated or revoked sooner.    Influenza A by PCR NEGATIVE NEGATIVE Final   Influenza B by PCR NEGATIVE NEGATIVE Final    Comment: (NOTE) The Xpert Xpress SARS-CoV-2/FLU/RSV assay is intended as an aid in  the diagnosis of influenza from Nasopharyngeal swab specimens and  should not be used as a sole basis for treatment. Nasal washings and  aspirates are unacceptable for Xpert  Xpress SARS-CoV-2/FLU/RSV  testing. Fact Sheet for Patients: PinkCheek.be Fact Sheet for Healthcare Providers: GravelBags.it This test is not yet approved or cleared by the [place] FDA and  has been authorized for detection and/or diagnosis of SARS-CoV-2 by  FDA under an Emergency Use Authorization (EUA). This EUA will remain  in effect (meaning this test can be used) for the duration of the  Covid-19 declaration under Section 564(b)(1) of the Act, 21  U.S.C. section 360bbb-3(b)(1), unless the authorization is  terminated or revoked. Performed at [facility], [place] [address], [place], [place] [address]   SARS CORONAVIRUS 2 (TAT 6-24 HRS) Nasopharyngeal Nasopharyngeal Swab     Status: None   Collection Time: 12/27/19  3:17 PM   Specimen: Nasopharyngeal Swab  Result Value Ref Range  Status   SARS Coronavirus 2 NEGATIVE NEGATIVE Final    Comment: (NOTE) SARS-CoV-2 target nucleic acids are NOT DETECTED. The SARS-CoV-2 RNA is generally detectable in upper and lower respiratory specimens during the acute phase of infection. Negative results do not preclude SARS-CoV-2 infection, do not rule out co-infections with other pathogens, and should not be used as the sole basis for treatment or other patient management decisions. Negative results must be combined with clinical observations, patient history, and epidemiological information. The expected result is Negative. Fact Sheet for Patients: SugarRoll.be Fact Sheet for Healthcare Providers: https://www.woods-mathews.com/ This test is not yet approved or cleared by the [place] FDA and  has been authorized for detection and/or diagnosis of SARS-CoV-2 by FDA under an Emergency Use Authorization (EUA). This EUA will remain  in effect (meaning this test can be used) for the duration of the COVID-19 declaration under Section 56 4(b)(1) of the Act, 21 U.S.C. section 360bbb-3(b)(1), unless the authorization is terminated or revoked sooner. Performed at [facility], [place] [name], [place], [place] [address]      Labs: BNP (last 3 results) Recent Labs    09/12/19 1527 10/26/19 1027 12/23/19 1624  BNP 2,054.2* 756.7* 262.0*   Basic Metabolic Panel: Recent Labs  Lab 12/23/19 1313 12/23/19 2050 12/24/19 0745 12/25/19 0713  NA 140  --  143 140  K 3.3*  --  3.9 3.8  CL 104  --  107 101  CO2 24  --  24 25  GLUCOSE 131*  --  84 101*  BUN 53*  --  45* 49*  CREATININE 1.78*  --  1.67* 1.67*  CALCIUM 9.0  --  9.2 8.6*  MG  --  2.0  --   --   PHOS  --  3.9  --   --    Liver Function Tests: Recent Labs  Lab 12/23/19 1313  AST 22  ALT 14  ALKPHOS 60  BILITOT 0.6  PROT 6.7  ALBUMIN 3.1*   No results for input(s): LIPASE, AMYLASE in the last 168 hours. No  results for input(s): AMMONIA in the last 168 hours. CBC: Recent Labs  Lab 12/23/19 1313 12/23/19 2050 12/24/19 0745 12/24/19 1039 12/24/19 1732 12/25/19 0713 12/26/19 0457  WBC 5.9  --  5.1 4.9  --  8.4 8.8  NEUTROABS  --   --   --   --   --  6.9 6.8  HGB 7.0*   < > 10.9* 11.0* 10.2* 10.0* 10.0*  HCT 22.6*   < > 33.6* 33.9* 31.7* 31.3* 30.9*  MCV 97.8  --  93.1 93.6  --  93.7 93.1  PLT 299  --  301 306  --  290 308   < > = values in this interval not displayed.   Cardiac Enzymes: Recent Labs  Lab 12/23/19 1621 12/24/19 0745  CKTOTAL 358* 262*   BNP: Invalid input(s): POCBNP CBG: Recent Labs  Lab 12/24/19 1157  GLUCAP 82   D-Dimer No results for input(s): DDIMER in the last 72 hours. Hgb A1c No results for input(s): HGBA1C in the last 72 hours. Lipid Profile No results for input(s): CHOL, HDL, LDLCALC, TRIG, CHOLHDL, LDLDIRECT in the last 72 hours. Thyroid function studies No results for input(s): TSH, T4TOTAL, T3FREE, THYROIDAB in the last 72 hours.  Invalid input(s): FREET3 Anemia work up No results for input(s): VITAMINB12, FOLATE, FERRITIN, TIBC, IRON, RETICCTPCT in the last 72 hours. Urinalysis    Component Value Date/Time   COLORURINE STRAW (A) 10/26/2019 1745   APPEARANCEUR CLEAR 10/26/2019 1745   LABSPEC 1.008 10/26/2019 1745   PHURINE 8.0 10/26/2019 1745   GLUCOSEU NEGATIVE 10/26/2019 1745   GLUCOSEU NEG mg/dL 02/20/2009 2049   HGBUR SMALL (A) 10/26/2019 1745   BILIRUBINUR NEGATIVE 10/26/2019 1745   BILIRUBINUR neg 05/06/2018 1200   KETONESUR NEGATIVE 10/26/2019 1745   PROTEINUR 30 (A) 10/26/2019 1745   UROBILINOGEN 0.2 05/06/2018 1200   UROBILINOGEN 0.2 04/25/2014 1023   NITRITE NEGATIVE 10/26/2019 1745   LEUKOCYTESUR NEGATIVE 10/26/2019 1745   Sepsis Labs Invalid input(s): PROCALCITONIN,  WBC,  LACTICIDVEN Microbiology Recent Results (from the past 240 hour(s))  Respiratory Panel by RT PCR (Flu A&B, Covid) - Nasopharyngeal Swab      Status: None   Collection Time: 12/23/19  4:34 PM   Specimen: Nasopharyngeal Swab  Result Value Ref Range Status   SARS Coronavirus 2 by RT PCR NEGATIVE NEGATIVE Final    Comment: (NOTE) SARS-CoV-2 target nucleic acids are NOT DETECTED. The SARS-CoV-2 RNA is generally detectable in upper respiratoy specimens during the acute phase of infection. The lowest concentration of SARS-CoV-2 viral copies this assay can detect is 131 copies/mL. A negative result does not preclude SARS-Cov-2 infection and should not be used as the sole basis for treatment or other patient management decisions. A negative result may occur with  improper specimen collection/handling, submission of specimen other than nasopharyngeal swab, presence of viral mutation(s) within the areas targeted by this assay, and inadequate number of viral copies (<131 copies/mL). A negative result must be combined with clinical observations, patient history, and epidemiological information. The expected result is Negative. Fact Sheet for Patients:  PinkCheek.be Fact Sheet for Healthcare Providers:  GravelBags.it This test is not yet ap proved or cleared by the [place] FDA and  has been authorized for detection and/or diagnosis of SARS-CoV-2 by FDA under an Emergency Use Authorization (EUA). This EUA will remain  in effect (meaning this test can be used) for the duration of the COVID-19 declaration under Section 564(b)(1) of the Act, 21 U.S.C. section 360bbb-3(b)(1), unless the authorization is terminated or revoked sooner.    Influenza A by PCR NEGATIVE NEGATIVE Final   Influenza B by PCR NEGATIVE NEGATIVE Final    Comment: (NOTE) The Xpert Xpress SARS-CoV-2/FLU/RSV assay is intended as an aid in  the diagnosis of influenza from Nasopharyngeal swab specimens and  should not be used as a sole basis for treatment. Nasal washings and  aspirates are unacceptable for  Xpert Xpress SARS-CoV-2/FLU/RSV  testing. Fact Sheet for Patients: PinkCheek.be Fact Sheet for Healthcare Providers: GravelBags.it This test is not yet approved or cleared by the [place] FDA and  has been authorized for detection  and/or diagnosis of SARS-CoV-2 by  FDA under an Emergency Use Authorization (EUA). This EUA will remain  in effect (meaning this test can be used) for the duration of the  Covid-19 declaration under Section 564(b)(1) of the Act, 21  U.S.C. section 360bbb-3(b)(1), unless the authorization is  terminated or revoked. Performed at [facility], [place] [address] [place], [place] [address]   SARS CORONAVIRUS 2 (TAT 6-24 HRS) Nasopharyngeal Nasopharyngeal Swab     Status: None   Collection Time: 12/27/19  3:17 PM   Specimen: Nasopharyngeal Swab  Result Value Ref Range Status   SARS Coronavirus 2 NEGATIVE NEGATIVE Final    Comment: (NOTE) SARS-CoV-2 target nucleic acids are NOT DETECTED. The SARS-CoV-2 RNA is generally detectable in upper and lower respiratory specimens during the acute phase of infection. Negative results do not preclude SARS-CoV-2 infection, do not rule out co-infections with other pathogens, and should not be used as the sole basis for treatment or other patient management decisions. Negative results must be combined with clinical observations, patient history, and epidemiological information. The expected result is Negative. Fact Sheet for Patients: SugarRoll.be Fact Sheet for Healthcare Providers: https://www.woods-mathews.com/ This test is not yet approved or cleared by the [place] FDA and  has been authorized for detection and/or diagnosis of SARS-CoV-2 by FDA under an Emergency Use Authorization (EUA). This EUA will remain  in effect (meaning this test can be used) for the duration of the COVID-19 declaration under Section  56 4(b)(1) of the Act, 21 U.S.C. section 360bbb-3(b)(1), unless the authorization is terminated or revoked sooner. Performed at [facility], [place] [address], [place], [place] [address]     Please note: You were cared for by a hospitalist during your hospital stay. Once you are discharged, your primary care physician will handle any further medical issues. Please note that NO REFILLS for any discharge medications will be authorized once you are discharged, as it is imperative that you return to your primary care physician (or establish a relationship with a primary care physician if you do not have one) for your post hospital discharge needs so that they can reassess your need for medications and monitor your lab values.    Time coordinating discharge: 40 minutes  SIGNED:   Shelly Coss, MD  [facility] 12/28/2019, 11:07 AM Pager [phone]  If 7PM-7AM, please contact night-coverage [email or web address] Password [email or web address]

## 2019-12-28 NOTE — TOC Transition Note (Signed)
Transition of Care (TOC) - CM/SW Discharge Note *Discharged to [facility] via non-emergency ambulance    Patient Details  Name: Gina [name] MRN: [id number] Date of Birth: 18-Jul-1929  Transition of Care [facility]) CM/SW Contact:  Sable Feil, LCSW Phone Number: 12/28/2019, 2:08 PM   Clinical Narrative:  Patient medically stable for discharge and admitting to [facility] for [place] rehab. Discharge clinicals transmitted to facility and nice Gina [name] contacted [phone]) and advised of discharge. Nurse provided with information to call report.     Final next level of care: Skilled Nursing Facility([facility]) Barriers to Discharge: Barriers Resolved   Patient Goals and CMS Choice Patient states their goals for this hospitalization and ongoing recovery are:: Niece wants patient to have ST rehab before returning home CMS Medicare.gov Compare Post Acute Care list provided to:: Patient Represenative (must comment) Choice offered to / list presented to : Patient([name])  Discharge Placement   Existing PASRR number confirmed : 12/26/19([phone])          Patient chooses bed at: [facility] Patient to be transferred to facility by: [facility] Name of family member notified: [name] - [phone] Patient and family notified of of transfer: 12/28/19  Discharge Plan and [place]                                     Social Determinants of Health (SDOH) Interventions  No SDOH interventions needed or requested prior to discharge   Readmission Risk Interventions Readmission Risk Prevention Plan 04/20/2019 04/15/2019  Transportation Screening Complete Complete  PCP or Specialist Appt within 3-5 Days Complete -  HRI or Home Care Consult Complete -  Social Work Consult for [place] Planning/Counseling Complete -  Palliative Care Screening Not Applicable -  Medication  Review Press photographer) Complete -  HRI or [place] - Complete  [place] - Not Applicable  Some recent data might be hidden

## 2019-12-28 NOTE — Progress Notes (Signed)
Physical Therapy Treatment Patient Details Name: Gina [name] MRN: [id number] DOB: 22-Jan-1929 Today's Date: 12/28/2019    History of Present Illness Pt is an 86 year old female with a history of HFpEF, DM, HTN, PVD, HLD, CKD IIIb, anemia, and breast carcinoma s/p mastectomy, who presents 12/23/18 after being found on the floor where she had been after falling approximately 10 hours prior. Pt unable to walk with L hip pain (xray non revealing), Head CT negative, pt + for anemia and blood in stool.  EGD on 12/24/2019 showed multiple large angiodysplastic lesions with bleeding in gastric fundus/body , treated with coagulation.  Also found to have angiodysplastic lesion without bleeding in duodenum and jejunum.  Pt stable for d/c per MD notes when SNF bed available    PT Comments    Pt demonstrating some progress today.  She was able to perform transfers with mod A and cues.  Pt continues to be limited by pain/soreness and fear of falling.  Required increased time and max education/encouragement on importance of exercises and OOB activity.    Follow Up Recommendations  SNF     Equipment Recommendations  3in1 (PT)    Recommendations for Other Services       Precautions / Restrictions Precautions Precautions: Fall    Mobility  Bed Mobility Overal bed mobility: Needs Assistance         Sit to supine: Mod assist;HOB elevated   General bed mobility comments: cues for technique, assist for LEs over EOB and to raise trunk, pt attempting to use rail but ineffective  Transfers Overall transfer level: Needs assistance Equipment used: Rolling walker (2 wheeled) Transfers: Sit to/from [facility] Sit to Stand: Mod assist;From elevated surface Stand pivot transfers: Mod assist;From elevated surface       General transfer comment: cues for safe hand placement; assist to rise and steady;  cues for posture and for sequencing to pivot  Ambulation/Gait Ambulation/Gait  assistance: Mod assist Gait Distance (Feet): 1 Feet Assistive device: Rolling walker (2 wheeled) Gait Pattern/deviations: Decreased stride length;Shuffle     General Gait Details: only took small steps to chair; pt reports fearful and pain; 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, [employer] (Stroke Patients Only)       Balance Overall balance assessment: Needs assistance Sitting-balance support: Feet supported;No upper extremity supported Sitting balance-Leahy Scale: Good     Standing balance support: Bilateral upper extremity supported Standing balance-Leahy Scale: Poor Standing balance comment: tendency to posterior lean - tactile cues/assist to correct                            Cognition Arousal/Alertness: Awake/alert   Overall Cognitive Status: No family/caregiver present to determine baseline cognitive functioning Area of Impairment: Safety/judgement;Problem solving                             Problem Solving: Slow processing;Requires verbal cues General Comments: pt is a difficult historian, a bit tangental      Exercises General Exercises - Lower Extremity Ankle Circles/Pumps: Both;15 reps;Seated Long Arc Quad: AROM;Right;AAROM;Left;15 reps;Seated Hip ABduction/ADduction: AROM;Right;AAROM;Left;10 reps;Supine Hip Flexion/Marching: AROM;Right;AAROM;Left;15 reps;Seated    General Comments General comments (skin integrity, edema, etc.): Pt requiring max cues, encouragment, and education to participate.  Pt c/o clots and numbness in L leg/foot.  Reviewed chart pt w/ hx of fem-pop bypass and PVD, no current clots.  Pt reports numbness is chronic. Pt is fearful of falling and reports soreness - required max encouragement on importance of OOB activity.      Pertinent Vitals/Pain Pain Assessment: Faces Faces Pain Scale: Hurts even more Pain Location: L hip, back, generalized- with movement Pain  Descriptors / Indicators: Grimacing;Guarding;Discomfort;Sore Pain Intervention(s): Limited activity within patient's tolerance;Monitored during session;Relaxation;Repositioned    Home Living                      Prior Function            PT Goals (current goals can now be found in the care plan section) Acute Rehab PT Goals Patient Stated Goal: decrease pain, return home PT Goal Formulation: With patient Time For Goal Achievement: 01/07/20 Progress towards PT goals: Progressing toward goals    Frequency    Min 2X/week      PT Plan Current plan remains appropriate    Co-evaluation              AM-PAC PT "6 Clicks" Mobility   Outcome Measure  Help needed turning from your back to your side while in a flat bed without using bedrails?: A Lot Help needed moving from lying on your back to sitting on the side of a flat bed without using bedrails?: A Lot Help needed moving to and from a bed to a chair (including a wheelchair)?: A Lot Help needed standing up from a chair using your arms (e.g., wheelchair or bedside chair)?: A Lot Help needed to walk in hospital room?: A Lot Help needed climbing 3-5 steps with a railing? : Total 6 Click Score: 11    End of Session Equipment Utilized During Treatment: Gait belt Activity Tolerance: Patient limited by pain(and by fear of falling) Patient left: with chair alarm set;in chair;with call bell/phone within reach Nurse Communication: Mobility status PT Visit Diagnosis: Muscle weakness (generalized) (M62.81);Difficulty in walking, not elsewhere classified (R26.2);Pain;History of falling (Z91.81) Pain - Right/Left: Left Pain - part of body: Leg     Time: 2836-6294 PT Time Calculation (min) (ACUTE ONLY): 29 min  Charges:  $Therapeutic Exercise: 8-22 mins $Therapeutic Activity: 8-22 mins                     Maggie Font, PT Acute Rehab Services Pager [phone] [facility] [phone] [facility]  [phone]    Karlton Lemon 12/28/2019, 11:29 AM

## 2019-12-28 NOTE — Progress Notes (Signed)
DISCHARGE NOTE HOME BRITTANNY LEVENHAGEN to be discharged Skilled nursing facility per MD order. Discussed prescriptions and follow up appointments with the patient. Prescriptions given to patient; medication list explained in detail. Patient verbalized understanding.  Skin clean, dry and intact without evidence of skin break down, no evidence of skin tears noted. IV catheter discontinued intact. Site without signs and symptoms of complications. Dressing and pressure applied. Pt denies pain at the site currently. No complaints noted.  Patient free of lines, drains, and wounds.   An After Visit Summary (AVS) was printed and given to the patient. Patient escorted via wheelchair, and discharged home via private auto.  Arlyss Repress, RN

## 2019-12-28 NOTE — Plan of Care (Signed)
  Problem: Nutrition: Goal: Adequate nutrition will be maintained Outcome: Progressing   

## 2020-01-03 ENCOUNTER — Ambulatory Visit: Payer: Medicare Other | Admitting: Cardiology

## 2020-01-23 ENCOUNTER — Encounter (HOSPITAL_COMMUNITY): Payer: Self-pay

## 2020-01-23 ENCOUNTER — Other Ambulatory Visit: Payer: Self-pay

## 2020-01-23 ENCOUNTER — Inpatient Hospital Stay (HOSPITAL_COMMUNITY)
Admission: EM | Admit: 2020-01-23 | Discharge: 2020-02-03 | DRG: 377 | Disposition: A | Discharge status: Skilled Nursing Facility | Payer: Medicare Other | Attending: Internal Medicine | Admitting: Internal Medicine

## 2020-01-23 DIAGNOSIS — E119 Type 2 diabetes mellitus without complications: Secondary | ICD-10-CM

## 2020-01-23 DIAGNOSIS — E1122 Type 2 diabetes mellitus with diabetic chronic kidney disease: Secondary | ICD-10-CM | POA: Diagnosis present

## 2020-01-23 DIAGNOSIS — R0602 Shortness of breath: Secondary | ICD-10-CM

## 2020-01-23 DIAGNOSIS — R059 Cough, unspecified: Secondary | ICD-10-CM

## 2020-01-23 DIAGNOSIS — Z8673 Personal history of transient ischemic attack (TIA), and cerebral infarction without residual deficits: Secondary | ICD-10-CM

## 2020-01-23 DIAGNOSIS — G9341 Metabolic encephalopathy: Secondary | ICD-10-CM

## 2020-01-23 DIAGNOSIS — E039 Hypothyroidism, unspecified: Secondary | ICD-10-CM | POA: Diagnosis present

## 2020-01-23 DIAGNOSIS — R7989 Other specified abnormal findings of blood chemistry: Secondary | ICD-10-CM

## 2020-01-23 DIAGNOSIS — M1612 Unilateral primary osteoarthritis, left hip: Secondary | ICD-10-CM | POA: Diagnosis present

## 2020-01-23 DIAGNOSIS — Z8711 Personal history of peptic ulcer disease: Secondary | ICD-10-CM

## 2020-01-23 DIAGNOSIS — I081 Rheumatic disorders of both mitral and tricuspid valves: Secondary | ICD-10-CM | POA: Diagnosis present

## 2020-01-23 DIAGNOSIS — I130 Hypertensive heart and chronic kidney disease with heart failure and stage 1 through stage 4 chronic kidney disease, or unspecified chronic kidney disease: Secondary | ICD-10-CM | POA: Diagnosis present

## 2020-01-23 DIAGNOSIS — Z6820 Body mass index (BMI) 20.0-20.9, adult: Secondary | ICD-10-CM

## 2020-01-23 DIAGNOSIS — E785 Hyperlipidemia, unspecified: Secondary | ICD-10-CM | POA: Diagnosis present

## 2020-01-23 DIAGNOSIS — I5033 Acute on chronic diastolic (congestive) heart failure: Secondary | ICD-10-CM

## 2020-01-23 DIAGNOSIS — M25561 Pain in right knee: Secondary | ICD-10-CM | POA: Diagnosis present

## 2020-01-23 DIAGNOSIS — K922 Gastrointestinal hemorrhage, unspecified: Secondary | ICD-10-CM | POA: Diagnosis not present

## 2020-01-23 DIAGNOSIS — N179 Acute kidney failure, unspecified: Secondary | ICD-10-CM | POA: Diagnosis present

## 2020-01-23 DIAGNOSIS — Z8615 Personal history of latent tuberculosis infection: Secondary | ICD-10-CM

## 2020-01-23 DIAGNOSIS — E1151 Type 2 diabetes mellitus with diabetic peripheral angiopathy without gangrene: Secondary | ICD-10-CM | POA: Diagnosis present

## 2020-01-23 DIAGNOSIS — E43 Unspecified severe protein-calorie malnutrition: Secondary | ICD-10-CM | POA: Diagnosis present

## 2020-01-23 DIAGNOSIS — I82721 Chronic embolism and thrombosis of deep veins of right upper extremity: Secondary | ICD-10-CM | POA: Diagnosis present

## 2020-01-23 DIAGNOSIS — E876 Hypokalemia: Secondary | ICD-10-CM | POA: Diagnosis not present

## 2020-01-23 DIAGNOSIS — N189 Chronic kidney disease, unspecified: Secondary | ICD-10-CM

## 2020-01-23 DIAGNOSIS — I445 Left posterior fascicular block: Secondary | ICD-10-CM | POA: Diagnosis present

## 2020-01-23 DIAGNOSIS — B159 Hepatitis A without hepatic coma: Secondary | ICD-10-CM | POA: Diagnosis present

## 2020-01-23 DIAGNOSIS — Z853 Personal history of malignant neoplasm of breast: Secondary | ICD-10-CM

## 2020-01-23 DIAGNOSIS — Z90710 Acquired absence of both cervix and uterus: Secondary | ICD-10-CM

## 2020-01-23 DIAGNOSIS — Z79899 Other long term (current) drug therapy: Secondary | ICD-10-CM

## 2020-01-23 DIAGNOSIS — E7800 Pure hypercholesterolemia, unspecified: Secondary | ICD-10-CM | POA: Diagnosis present

## 2020-01-23 DIAGNOSIS — D509 Iron deficiency anemia, unspecified: Secondary | ICD-10-CM | POA: Diagnosis present

## 2020-01-23 DIAGNOSIS — I10 Essential (primary) hypertension: Secondary | ICD-10-CM | POA: Diagnosis present

## 2020-01-23 DIAGNOSIS — N184 Chronic kidney disease, stage 4 (severe): Secondary | ICD-10-CM | POA: Diagnosis present

## 2020-01-23 DIAGNOSIS — Z8049 Family history of malignant neoplasm of other genital organs: Secondary | ICD-10-CM

## 2020-01-23 DIAGNOSIS — R7401 Elevation of levels of liver transaminase levels: Secondary | ICD-10-CM

## 2020-01-23 DIAGNOSIS — J9621 Acute and chronic respiratory failure with hypoxia: Secondary | ICD-10-CM

## 2020-01-23 DIAGNOSIS — J90 Pleural effusion, not elsewhere classified: Secondary | ICD-10-CM

## 2020-01-23 DIAGNOSIS — Z90722 Acquired absence of ovaries, bilateral: Secondary | ICD-10-CM

## 2020-01-23 DIAGNOSIS — Z87891 Personal history of nicotine dependence: Secondary | ICD-10-CM

## 2020-01-23 DIAGNOSIS — L97819 Non-pressure chronic ulcer of other part of right lower leg with unspecified severity: Secondary | ICD-10-CM | POA: Diagnosis present

## 2020-01-23 DIAGNOSIS — Z66 Do not resuscitate: Secondary | ICD-10-CM | POA: Diagnosis not present

## 2020-01-23 DIAGNOSIS — R062 Wheezing: Secondary | ICD-10-CM

## 2020-01-23 DIAGNOSIS — R54 Age-related physical debility: Secondary | ICD-10-CM | POA: Diagnosis present

## 2020-01-23 DIAGNOSIS — W19XXXA Unspecified fall, initial encounter: Secondary | ICD-10-CM | POA: Diagnosis present

## 2020-01-23 DIAGNOSIS — Z9011 Acquired absence of right breast and nipple: Secondary | ICD-10-CM

## 2020-01-23 DIAGNOSIS — R05 Cough: Secondary | ICD-10-CM

## 2020-01-23 DIAGNOSIS — K31811 Angiodysplasia of stomach and duodenum with bleeding: Secondary | ICD-10-CM | POA: Diagnosis not present

## 2020-01-23 DIAGNOSIS — Z79890 Hormone replacement therapy (postmenopausal): Secondary | ICD-10-CM

## 2020-01-23 DIAGNOSIS — Z20822 Contact with and (suspected) exposure to covid-19: Secondary | ICD-10-CM | POA: Diagnosis present

## 2020-01-23 DIAGNOSIS — A059 Bacterial foodborne intoxication, unspecified: Secondary | ICD-10-CM | POA: Diagnosis present

## 2020-01-23 LAB — PREPARE RBC (CROSSMATCH)

## 2020-01-23 LAB — CBC WITH DIFFERENTIAL/PLATELET
Abs Immature Granulocytes: 0.02 10*3/uL (ref 0.00–0.07)
Basophils Absolute: 0 10*3/uL (ref 0.0–0.1)
Basophils Relative: 0 %
Eosinophils Absolute: 0 10*3/uL (ref 0.0–0.5)
Eosinophils Relative: 0 %
HCT: 24.8 % — ABNORMAL LOW (ref 36.0–46.0)
Hemoglobin: 7.6 g/dL — ABNORMAL LOW (ref 12.0–15.0)
Immature Granulocytes: 0 %
Lymphocytes Relative: 6 %
Lymphs Abs: 0.4 10*3/uL — ABNORMAL LOW (ref 0.7–4.0)
MCH: 29.2 pg (ref 26.0–34.0)
MCHC: 30.6 g/dL (ref 30.0–36.0)
MCV: 95.4 fL (ref 80.0–100.0)
Monocytes Absolute: 0.4 10*3/uL (ref 0.1–1.0)
Monocytes Relative: 6 %
Neutro Abs: 6 10*3/uL (ref 1.7–7.7)
Neutrophils Relative %: 88 %
Platelets: 225 10*3/uL (ref 150–400)
RBC: 2.6 MIL/uL — ABNORMAL LOW (ref 3.87–5.11)
RDW: 15.4 % (ref 11.5–15.5)
WBC: 6.8 10*3/uL (ref 4.0–10.5)
nRBC: 0 % (ref 0.0–0.2)

## 2020-01-23 LAB — POC OCCULT BLOOD, ED: Fecal Occult Bld: POSITIVE — AB

## 2020-01-23 MED ORDER — SODIUM CHLORIDE 0.9% IV SOLUTION: Freq: Once | INTRAVENOUS | Status: AC

## 2020-01-23 MED ORDER — PANTOPRAZOLE SODIUM 40 MG IV SOLR
40.0000 mg | Freq: Once | INTRAVENOUS | Status: AC
  Administered 2020-01-24: 40 mg via INTRAVENOUS
  Filled 2020-01-23: qty 40
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
## 2020-01-23 NOTE — ED Triage Notes (Signed)
Pt bib ems, pt lives alone, ems needed to break down the door to get in. Found pt on floor after using the bathroom. The pt said that she lowered herself to the floor. Pt verbalized that she didn't have the strength to get up. O2 sats in the 80-'s on RA. Pt aox4. With pitting edema on the legs. bp 135/70 p80 rr18 102.2temp

## 2020-01-23 NOTE — [facility] Counselor (Signed)
Per Dr. Leonette Monarch TTS consult was entered in error. EDP to removed TTS consult.    Vertell Novak, [place], [facility], [facility] Triage Specialist [phone]

## 2020-01-24 ENCOUNTER — Inpatient Hospital Stay (HOSPITAL_COMMUNITY): Payer: Medicare Other

## 2020-01-24 ENCOUNTER — Emergency Department (HOSPITAL_COMMUNITY): Payer: Medicare Other

## 2020-01-24 DIAGNOSIS — N1831 Chronic kidney disease, stage 3a: Secondary | ICD-10-CM

## 2020-01-24 DIAGNOSIS — Z853 Personal history of malignant neoplasm of breast: Secondary | ICD-10-CM | POA: Diagnosis not present

## 2020-01-24 DIAGNOSIS — D509 Iron deficiency anemia, unspecified: Secondary | ICD-10-CM | POA: Diagnosis present

## 2020-01-24 DIAGNOSIS — R7401 Elevation of levels of liver transaminase levels: Secondary | ICD-10-CM

## 2020-01-24 DIAGNOSIS — L97819 Non-pressure chronic ulcer of other part of right lower leg with unspecified severity: Secondary | ICD-10-CM | POA: Diagnosis present

## 2020-01-24 DIAGNOSIS — G9341 Metabolic encephalopathy: Secondary | ICD-10-CM | POA: Diagnosis present

## 2020-01-24 DIAGNOSIS — I5033 Acute on chronic diastolic (congestive) heart failure: Secondary | ICD-10-CM

## 2020-01-24 DIAGNOSIS — W19XXXA Unspecified fall, initial encounter: Secondary | ICD-10-CM | POA: Diagnosis present

## 2020-01-24 DIAGNOSIS — R609 Edema, unspecified: Secondary | ICD-10-CM | POA: Diagnosis not present

## 2020-01-24 DIAGNOSIS — E7800 Pure hypercholesterolemia, unspecified: Secondary | ICD-10-CM

## 2020-01-24 DIAGNOSIS — N179 Acute kidney failure, unspecified: Secondary | ICD-10-CM

## 2020-01-24 DIAGNOSIS — E1121 Type 2 diabetes mellitus with diabetic nephropathy: Secondary | ICD-10-CM

## 2020-01-24 DIAGNOSIS — Z6820 Body mass index (BMI) 20.0-20.9, adult: Secondary | ICD-10-CM | POA: Diagnosis not present

## 2020-01-24 DIAGNOSIS — Z20822 Contact with and (suspected) exposure to covid-19: Secondary | ICD-10-CM | POA: Diagnosis present

## 2020-01-24 DIAGNOSIS — J9621 Acute and chronic respiratory failure with hypoxia: Secondary | ICD-10-CM | POA: Diagnosis present

## 2020-01-24 DIAGNOSIS — Z90710 Acquired absence of both cervix and uterus: Secondary | ICD-10-CM | POA: Diagnosis not present

## 2020-01-24 DIAGNOSIS — E785 Hyperlipidemia, unspecified: Secondary | ICD-10-CM | POA: Diagnosis present

## 2020-01-24 DIAGNOSIS — K922 Gastrointestinal hemorrhage, unspecified: Secondary | ICD-10-CM | POA: Diagnosis present

## 2020-01-24 DIAGNOSIS — Z66 Do not resuscitate: Secondary | ICD-10-CM | POA: Diagnosis not present

## 2020-01-24 DIAGNOSIS — I130 Hypertensive heart and chronic kidney disease with heart failure and stage 1 through stage 4 chronic kidney disease, or unspecified chronic kidney disease: Secondary | ICD-10-CM | POA: Diagnosis present

## 2020-01-24 DIAGNOSIS — I10 Essential (primary) hypertension: Secondary | ICD-10-CM

## 2020-01-24 DIAGNOSIS — Z8615 Personal history of latent tuberculosis infection: Secondary | ICD-10-CM | POA: Diagnosis not present

## 2020-01-24 DIAGNOSIS — E43 Unspecified severe protein-calorie malnutrition: Secondary | ICD-10-CM | POA: Diagnosis present

## 2020-01-24 DIAGNOSIS — M1612 Unilateral primary osteoarthritis, left hip: Secondary | ICD-10-CM | POA: Diagnosis present

## 2020-01-24 DIAGNOSIS — Z90722 Acquired absence of ovaries, bilateral: Secondary | ICD-10-CM | POA: Diagnosis not present

## 2020-01-24 DIAGNOSIS — A059 Bacterial foodborne intoxication, unspecified: Secondary | ICD-10-CM | POA: Diagnosis present

## 2020-01-24 DIAGNOSIS — N184 Chronic kidney disease, stage 4 (severe): Secondary | ICD-10-CM | POA: Diagnosis present

## 2020-01-24 DIAGNOSIS — K31811 Angiodysplasia of stomach and duodenum with bleeding: Secondary | ICD-10-CM | POA: Diagnosis present

## 2020-01-24 DIAGNOSIS — Z9011 Acquired absence of right breast and nipple: Secondary | ICD-10-CM | POA: Diagnosis not present

## 2020-01-24 DIAGNOSIS — B159 Hepatitis A without hepatic coma: Secondary | ICD-10-CM | POA: Diagnosis present

## 2020-01-24 DIAGNOSIS — N189 Chronic kidney disease, unspecified: Secondary | ICD-10-CM

## 2020-01-24 DIAGNOSIS — I82721 Chronic embolism and thrombosis of deep veins of right upper extremity: Secondary | ICD-10-CM | POA: Diagnosis present

## 2020-01-24 LAB — CBC
HCT: 31.4 % — ABNORMAL LOW (ref 36.0–46.0)
HCT: 31.5 % — ABNORMAL LOW (ref 36.0–46.0)
Hemoglobin: 9.9 g/dL — ABNORMAL LOW (ref 12.0–15.0)
Hemoglobin: 10.1 g/dL — ABNORMAL LOW (ref 12.0–15.0)
MCH: 28.4 pg (ref 26.0–34.0)
MCH: 28.8 pg (ref 26.0–34.0)
MCHC: 31.5 g/dL (ref 30.0–36.0)
MCHC: 32.1 g/dL (ref 30.0–36.0)
MCV: 90 fL (ref 80.0–100.0)
MCV: 89.7 fL (ref 80.0–100.0)
Platelets: 180 10*3/uL (ref 150–400)
Platelets: 163 10*3/uL (ref 150–400)
RBC: 3.49 MIL/uL — ABNORMAL LOW (ref 3.87–5.11)
RBC: 3.51 MIL/uL — ABNORMAL LOW (ref 3.87–5.11)
RDW: 17.2 % — ABNORMAL HIGH (ref 11.5–15.5)
RDW: 17.3 % — ABNORMAL HIGH (ref 11.5–15.5)
WBC: 6.4 10*3/uL (ref 4.0–10.5)
WBC: 7 10*3/uL (ref 4.0–10.5)
nRBC: 0 % (ref 0.0–0.2)
nRBC: 0 % (ref 0.0–0.2)

## 2020-01-24 LAB — COMPREHENSIVE METABOLIC PANEL
ALT: 679 U/L — ABNORMAL HIGH (ref 0–44)
ALT: 850 U/L — ABNORMAL HIGH (ref 0–44)
AST: 1310 U/L — ABNORMAL HIGH (ref 15–41)
AST: 1477 U/L — ABNORMAL HIGH (ref 15–41)
Albumin: 3 g/dL — ABNORMAL LOW (ref 3.5–5.0)
Albumin: 3.2 g/dL — ABNORMAL LOW (ref 3.5–5.0)
Alkaline Phosphatase: 182 U/L — ABNORMAL HIGH (ref 38–126)
Alkaline Phosphatase: 185 U/L — ABNORMAL HIGH (ref 38–126)
Anion gap: 14 (ref 5–15)
Anion gap: 13 (ref 5–15)
BUN: 112 mg/dL — ABNORMAL HIGH (ref 8–23)
BUN: 109 mg/dL — ABNORMAL HIGH (ref 8–23)
CO2: 23 mmol/L (ref 22–32)
CO2: 25 mmol/L (ref 22–32)
Calcium: 8.5 mg/dL — ABNORMAL LOW (ref 8.9–10.3)
Calcium: 8.7 mg/dL — ABNORMAL LOW (ref 8.9–10.3)
Chloride: 103 mmol/L (ref 98–111)
Chloride: 103 mmol/L (ref 98–111)
Creatinine, Ser: 2.98 mg/dL — ABNORMAL HIGH (ref 0.44–1.00)
Creatinine, Ser: 2.87 mg/dL — ABNORMAL HIGH (ref 0.44–1.00)
GFR calc Af Amer: 16 mL/min — ABNORMAL LOW (ref >60)
GFR calc Af Amer: 16 mL/min — ABNORMAL LOW (ref >60)
GFR calc non Af Amer: 13 mL/min — ABNORMAL LOW (ref >60)
GFR calc non Af Amer: 14 mL/min — ABNORMAL LOW (ref >60)
Glucose, Bld: 164 mg/dL — ABNORMAL HIGH (ref 70–99)
Glucose, Bld: 105 mg/dL — ABNORMAL HIGH (ref 70–99)
Potassium: 3.7 mmol/L (ref 3.5–5.1)
Potassium: 3.5 mmol/L (ref 3.5–5.1)
Sodium: 140 mmol/L (ref 135–145)
Sodium: 141 mmol/L (ref 135–145)
Total Bilirubin: 1.3 mg/dL — ABNORMAL HIGH (ref 0.3–1.2)
Total Bilirubin: 1.4 mg/dL — ABNORMAL HIGH (ref 0.3–1.2)
Total Protein: 6.4 g/dL — ABNORMAL LOW (ref 6.5–8.1)
Total Protein: 6.9 g/dL (ref 6.5–8.1)

## 2020-01-24 LAB — GLUCOSE, CAPILLARY
Glucose-Capillary: 103 mg/dL — ABNORMAL HIGH (ref 70–99)
Glucose-Capillary: 115 mg/dL — ABNORMAL HIGH (ref 70–99)
Glucose-Capillary: 148 mg/dL — ABNORMAL HIGH (ref 70–99)

## 2020-01-24 LAB — RESPIRATORY PANEL BY RT PCR (FLU A&B, COVID)
Influenza A by PCR: NEGATIVE
Influenza B by PCR: NEGATIVE
SARS Coronavirus 2 by RT PCR: NEGATIVE

## 2020-01-24 LAB — URINALYSIS, ROUTINE W REFLEX MICROSCOPIC
Bilirubin Urine: NEGATIVE
Glucose, UA: NEGATIVE mg/dL
Hgb urine dipstick: NEGATIVE
Ketones, ur: NEGATIVE mg/dL
Nitrite: NEGATIVE
Protein, ur: NEGATIVE mg/dL
Specific Gravity, Urine: 1.012 (ref 1.005–1.030)
pH: 5 (ref 5.0–8.0)

## 2020-01-24 LAB — MRSA PCR SCREENING: MRSA by PCR: NEGATIVE

## 2020-01-24 LAB — BRAIN NATRIURETIC PEPTIDE: B Natriuretic Peptide: 2946.3 pg/mL — ABNORMAL HIGH (ref 0.0–100.0)

## 2020-01-24 LAB — CBG MONITORING, ED
Glucose-Capillary: 92 mg/dL (ref 70–99)
Glucose-Capillary: 104 mg/dL — ABNORMAL HIGH (ref 70–99)

## 2020-01-24 LAB — PROTIME-INR
INR: 1.7 — ABNORMAL HIGH (ref 0.8–1.2)
Prothrombin Time: 19.7 seconds — ABNORMAL HIGH (ref 11.4–15.2)

## 2020-01-24 LAB — ECHOCARDIOGRAM COMPLETE
Height: 67 in
Weight: 1856 oz

## 2020-01-24 LAB — ETHANOL: Alcohol, Ethyl (B): <10 mg/dL (ref <10)

## 2020-01-24 LAB — ACETAMINOPHEN LEVEL: Acetaminophen (Tylenol), Serum: 25 ug/mL (ref 10–30)

## 2020-01-24 LAB — CK: Total CK: 443 U/L — ABNORMAL HIGH (ref 38–234)

## 2020-01-24 LAB — APTT: aPTT: 33 seconds (ref 24–36)

## 2020-01-24 LAB — HEMOGLOBIN A1C
Hgb A1c MFr Bld: 6 % — ABNORMAL HIGH (ref 4.8–5.6)
Mean Plasma Glucose: 125.5 mg/dL

## 2020-01-24 LAB — AMMONIA: Ammonia: 23 umol/L (ref 9–35)

## 2020-01-24 IMAGING — US IR THORACENTESIS ASP PLEURAL SPACE W/IMG GUIDE
1 series · 4 of 4 positions shown · non-contrast
Comparison: none

INDICATION: Patient with history of chronic diastolic SALDANA, ADILSON on CKD stage III,
acute respiratory failure, and bilateral pleural effusions (right
s/p thoracentesis in IR 04/12/2019 yielding 1.4 L). Request is made
for diagnostic and therapeutic left thoracentesis.

[Series 1: ir (id) (id)/(id)/(id) ir · 4 of 4 slices shown]
[im 1/4]
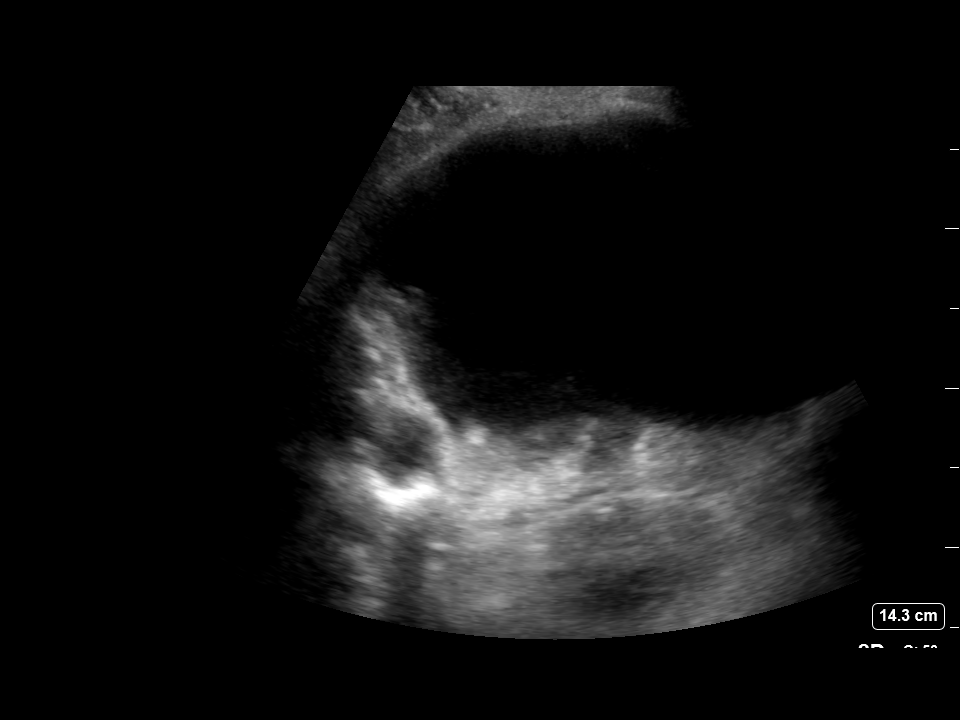
[im 2/4]
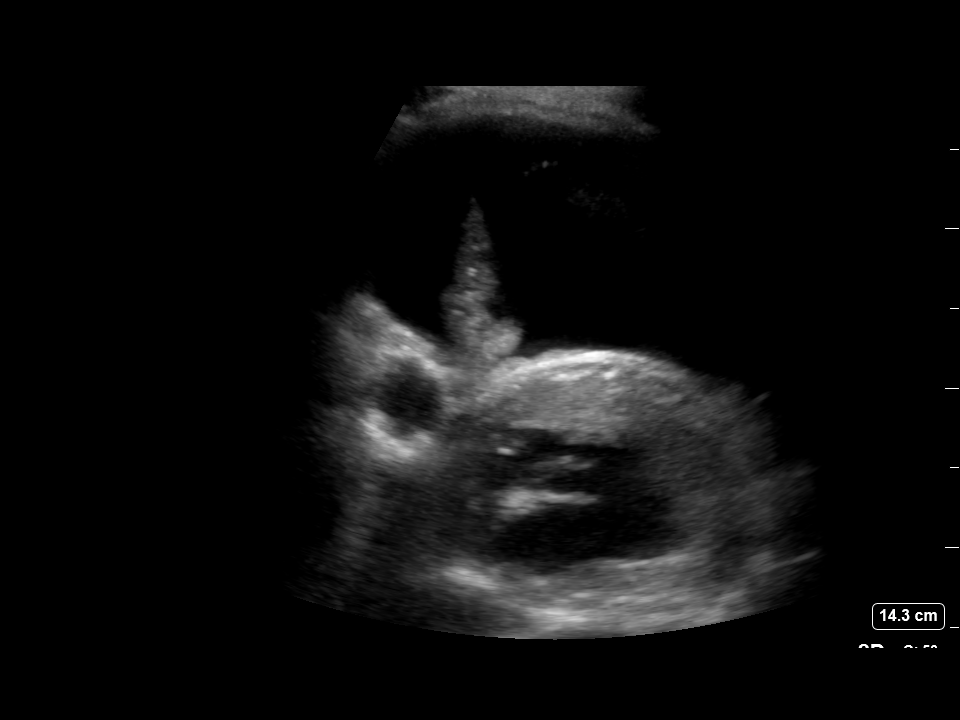
[im 3/4]
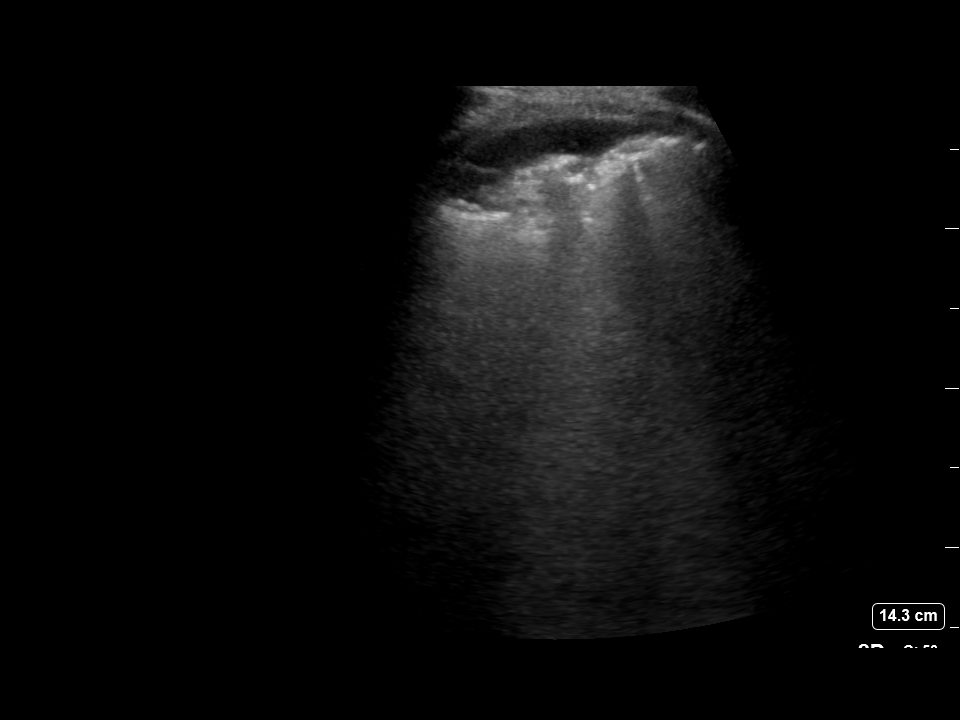
[im 4/4]
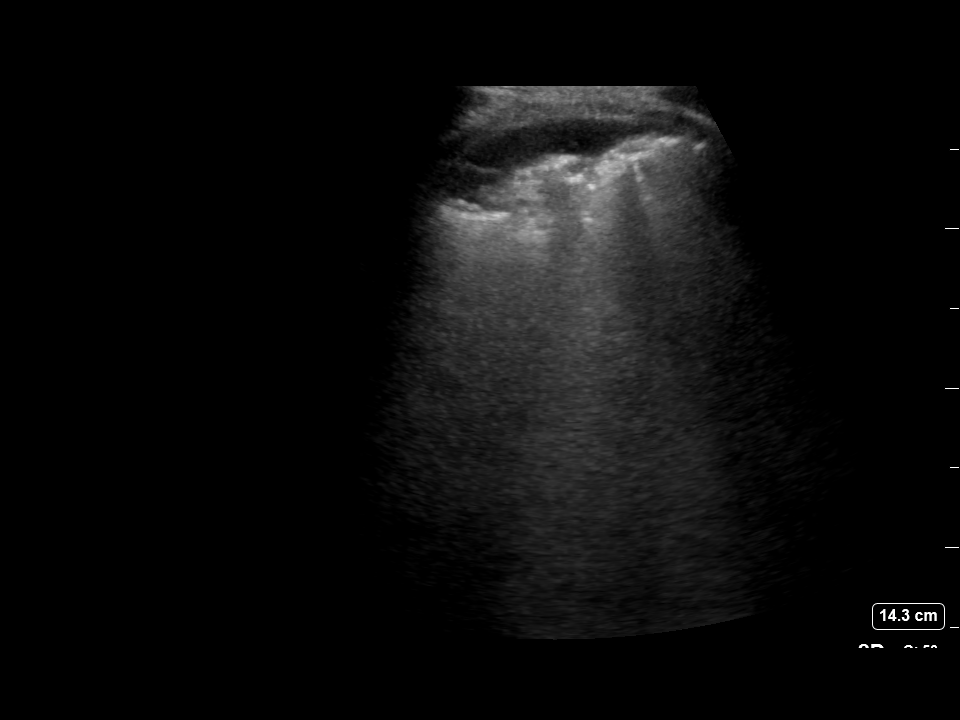

[4 of 4 positions shown; findings below may reference images not displayed]

EXAM:
ULTRASOUND GUIDED DIAGNOSTIC AND THERAPEUTIC LEFT THORACENTESIS

MEDICATIONS:
10 mL 1% lidocaine

COMPLICATIONS:
None immediate.

PROCEDURE:
An ultrasound guided thoracentesis was thoroughly discussed with the
patient and questions answered. The benefits, risks, alternatives
and complications were also discussed. The patient understands and
wishes to proceed with the procedure. Written consent was obtained.

Ultrasound was performed to localize and mark an adequate pocket of
fluid in the left chest. The area was then prepped and draped in the
normal sterile fashion. 1% Lidocaine was used for local anesthesia.
Under ultrasound guidance a 6 Fr Safe-T-Centesis catheter was
introduced. Thoracentesis was performed. The catheter was removed
and a dressing applied.
FINDINGS: A total of approximately 800 mL of clear gold fluid was removed.
Samples were sent to the laboratory as requested by the clinical
team.
IMPRESSION: Successful ultrasound guided left thoracentesis yielding 800 mL of
pleural fluid.

## 2020-01-24 MED ORDER — FUROSEMIDE 10 MG/ML IJ SOLN
40.0000 mg | Freq: Two times a day (BID) | INTRAMUSCULAR | Status: DC
  Filled 2020-01-24: qty 4

## 2020-01-24 MED ORDER — PANTOPRAZOLE SODIUM 40 MG IV SOLR: 40.0000 mg | Freq: Every day | INTRAVENOUS | Status: DC

## 2020-01-24 MED ORDER — FUROSEMIDE 10 MG/ML IJ SOLN
20.0000 mg | Freq: Once | INTRAMUSCULAR | Status: AC
  Administered 2020-01-24: 20 mg via INTRAVENOUS
  Filled 2020-01-24: qty 2

## 2020-01-24 MED ORDER — INSULIN ASPART 100 UNIT/ML ~~LOC~~ SOLN
0.0000 [IU] | Freq: Three times a day (TID) | SUBCUTANEOUS | Status: DC
  Administered 2020-01-25 – 2020-01-28 (×5): 2 [IU] via SUBCUTANEOUS
  Administered 2020-01-30 – 2020-01-31 (×2): 1 [IU] via SUBCUTANEOUS
  Administered 2020-02-01: 2 [IU] via SUBCUTANEOUS
  Administered 2020-02-02 – 2020-02-03 (×2): 1 [IU] via SUBCUTANEOUS

## 2020-01-24 MED ORDER — SODIUM CHLORIDE 0.9 % IV SOLN
8.0000 mg/h | INTRAVENOUS | Status: AC
  Administered 2020-01-24 – 2020-01-27 (×4): 8 mg/h via INTRAVENOUS
  Filled 2020-01-24 (×7): qty 80

## 2020-01-24 MED ORDER — PANTOPRAZOLE SODIUM 40 MG IV SOLR
40.0000 mg | Freq: Once | INTRAVENOUS | Status: AC
  Administered 2020-01-24: 40 mg via INTRAVENOUS
  Filled 2020-01-24: qty 40

## 2020-01-24 MED ORDER — SODIUM CHLORIDE 0.9 % IV SOLN
INTRAVENOUS | Status: DC
  Administered 2020-01-24: 1000 mL via INTRAVENOUS

## 2020-01-24 MED ORDER — NETARSUDIL DIMESYLATE 0.02 % OP SOLN
1.0000 [drp] | Freq: Every day | OPHTHALMIC | Status: DC
  Administered 2020-01-24 – 2020-01-26 (×3): 1 [drp] via OPHTHALMIC
  Filled 2020-01-24: qty 1

## 2020-01-24 MED ORDER — LEVOTHYROXINE SODIUM 112 MCG PO TABS
112.0000 ug | ORAL_TABLET | Freq: Every day | ORAL | Status: DC
  Administered 2020-01-26 – 2020-02-01 (×6): 112 ug via ORAL
  Filled 2020-01-24 (×8): qty 1

## 2020-01-24 MED ORDER — PANTOPRAZOLE SODIUM 40 MG IV SOLR
40.0000 mg | Freq: Two times a day (BID) | INTRAVENOUS | Status: DC
  Administered 2020-01-27 – 2020-01-28 (×2): 40 mg via INTRAVENOUS
  Filled 2020-01-24 (×2): qty 40

## 2020-01-24 MED ORDER — LATANOPROST 0.005 % OP SOLN: 1.0000 [drp] | Freq: Every day | OPHTHALMIC | Status: DC

## 2020-01-24 MED ORDER — ALBUTEROL SULFATE HFA 108 (90 BASE) MCG/ACT IN AERS
2.0000 | INHALATION_SPRAY | Freq: Four times a day (QID) | RESPIRATORY_TRACT | Status: DC | PRN
  Filled 2020-01-24: qty 6.7

## 2020-01-24 MED ORDER — FUROSEMIDE 10 MG/ML IJ SOLN: 40.0000 mg | Freq: Once | INTRAMUSCULAR | Status: DC

## 2020-01-24 MED ORDER — LATANOPROST 0.005 % OP SOLN
1.0000 [drp] | Freq: Every day | OPHTHALMIC | Status: DC
  Administered 2020-01-24 – 2020-02-02 (×10): 1 [drp] via OPHTHALMIC
  Filled 2020-01-24: qty 2.5

## 2020-01-24 MED ORDER — VITAMIN K1 10 MG/ML IJ SOLN
5.0000 mg | Freq: Once | INTRAVENOUS | Status: AC
  Administered 2020-01-24: 5 mg via INTRAVENOUS
  Filled 2020-01-24: qty 0.5

## 2020-01-24 NOTE — Plan of Care (Signed)
  Problem: Education: Goal: Knowledge of General Education information will improve Description: Including pain rating scale, medication(s)/side effects and non-pharmacologic comfort measures 01/24/2020 2258 by Loma Messing, RN Outcome: Progressing 01/24/2020 2258 by Loma Messing, RN Outcome: Progressing   Problem: Health Behavior/Discharge Planning: Goal: Ability to manage health-related needs will improve 01/24/2020 2258 by Loma Messing, RN Outcome: Progressing 01/24/2020 2258 by Loma Messing, RN Outcome: Progressing

## 2020-01-24 NOTE — ED Notes (Signed)
Skin tear noted on right knee, covered and bacitracin applied.

## 2020-01-24 NOTE — ED Notes (Signed)
Got patient cleaned up placed a external cath patient is resting with call bell in reach

## 2020-01-24 NOTE — Progress Notes (Addendum)
Patient trasfered from ED to [phone] via stretcher; alert and oriented x 3;  complaints of pain in both knees and heels (chronic pain); IV in RFA running fluids at 10 ml/hr per order, and LFA saline locked; skin - dry; skin tear on right knee; wound on right knee.  Orient patient to room and unit; gave patient care guide; instructed how to use the call bell and  fall risk precautions. Will continue to monitor the patient.

## 2020-01-24 NOTE — ED Provider Notes (Signed)
Attestation: Medical screening examination/treatment/procedure(s) were conducted as a shared visit with non-physician practitioner(s) and myself.  I personally evaluated the patient during the encounter.   Briefly, the patient is a 86 y.o. female with h/o chronic blood loss anemia from GI bleed requiring transfusions here for several days of generalized fatigue.  Vitals:   01/24/20 0417 01/24/20 0557  BP: (!) 145/49 (!) 145/78  Pulse: 65 (!) 52  Resp: 18 18  Temp: 97.6 F (36.4 C)   SpO2: 95% 95%    CONSTITUTIONAL: Nontoxic-appearing, NAD NEURO:  Alert and oriented x 3, no focal deficits EYES:  pupils equal and reactive ENT/NECK:  trachea midline, no JVD CARDIO: Regular rate, regular rhythm, well-perfused.  Peripheral edema PULM: None labored breathing GI/GU:  Abdomin non-distended MSK/SPINE:  No gross deformities, no edema SKIN:  no rash, atraumatic PSYCH:  Appropriate speech and behavior   EKG Interpretation  Date/Time:  Sunday January 23 2020 21:43:57 EDT Ventricular Rate:  65 PR Interval:    QRS Duration: 92 QT Interval:  445 QTC Calculation: 463 R Axis:   156 Text Interpretation: Sinus rhythm Atrial premature complexes in couplets Left posterior fascicular block Probable anterior infarct, age indeterminate Lateral leads are also involved Otherwise no significant change Confirmed by Addison Lank [phone]) on 01/23/2020 11:13:18 PM       Work up notable for almost a 3 g drop in her hemoglobin since March.  Positive Hemoccult. Work-up also consistent with CHF exacerbation and AKI.  Patient required transfusion of PRBCs.  Will require admission for continued transfusion under close monitoring as well as further treatment of her CHF exacerbation.  CRITICAL CARE Performed by: Grayce Sessions Raiana Pharris Total critical care time: 20 minutes Critical care time was exclusive of separately billable procedures and treating other patients. Critical care was necessary to treat or  prevent imminent or life-threatening deterioration. Critical care was time spent personally by me on the following activities: development of treatment plan with patient and/or surrogate as well as nursing, discussions with consultants, evaluation of patient's response to treatment, examination of patient, obtaining history from patient or surrogate, ordering and performing treatments and interventions, ordering and review of laboratory studies, ordering and review of radiographic studies, pulse oximetry and re-evaluation of patient's condition.      Fatima Blank, MD 01/24/20 [phone]

## 2020-01-24 NOTE — ED Notes (Signed)
Checked patient cgb it was 104 notified RN of blood sugar patient is resting with call bell in reach
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
## 2020-01-24 NOTE — Progress Notes (Signed)
  Echocardiogram 2D Echocardiogram has been performed.  Anays Detore G Falicia Lizotte 01/24/2020, 11:18 AM

## 2020-01-24 NOTE — Progress Notes (Addendum)
Gina [name]  LDJ:[id number] DOB: May 21, 1929 DOA: 01/23/2020 PCP: [name], NP    Brief Narrative:  86 year old with a history of gastric ulcer/AV malformations, PVD status post bilateral femoropopliteal bypasses, chronic hypoxic respiratory failure on 2 L, HTN, HLD, DM, CKD stage IV, mesenteric ischemia, Graves' disease, and diastolic congestive heart failure who presented to the ED complaining of weakness.  The patient was very lethargic and unable to provide a reliable history.  She did report significant lower extremity swelling right greater than left for at least 48 hours as well as dyspnea on exertion.  Reportedly she lives alone and EMS had to break into her home to care for her.  She was found to be saturating 80% on room air with a fever of 102.2.  The patient was discharged 3/30 after an admit for symptomatic anemia with a hemoglobin of 7 > 10.  She was transfused 1 unit of PRBCs and underwent an EGD which noted multiple large angiodysplastic lesions with bleeding in the gastric fundus and body that was treated.  She was also confirmed to have angiodysplastic lesions in the duodenum and jejunum with the plan being to repeat an EGD in 2 weeks.  Significant Events:  4/26 admit via [place]  Subjective: Pt is seen for a f/u visit.  Hemodynamically stable at this time.  Assessment & Plan:  Recurrent acute on chronic symptomatic anemia Required 1 unit of PRBCs in March due to bleeding from AVMs - Hgb 10.0 12/26/19 -recheck hemoglobin and consider transfusing further to goal of 8.0  Recent Labs  Lab 01/23/20 2300  HGB 7.6*    Acute/subacute UGIB - gastric, duodenal, jejunal AVMs Confirmed via EGD March 2021 -intention was for repeat EGD 2 weeks after discharge - consult Dr. Benson Norway (GI) once clear pt is stable to consider repeat EGD this admit   Altered mental status - acute multifactorial metabolic encephalopathy  Acute on chronic hypoxemic respiratory failure Normally  requires 2 L nasal cannula oxygen support and is presently requiring approximately 4 -x-ray notes probable atelectasis bilateral bases but also raises concern for possible effusion -follow-up CXR in a.m. -monitor with volume expansion as patient appears significantly volume depleted in general  Transaminitis LFTs markedly elevated with AST 1310 and ALT 679 at time of admission - these were normal in March - ?shock liver - APAP of 25 at time of presentation -I called and spoke with the patient's niece who says that the patient has not been taking excessive amounts of medications and in fact has missed many doses of her prescription medications recently because of severe weakness and nausea -therefore I think toxic Tylenol ingestion is not likely and I suspect her transaminitis is related to shock liver in the further history suggest she was probably laying on the floor incapacitated for prolonged period of time  Recent Labs  Lab 01/23/20 2300 01/24/20 1138  AST 1,310* 1,477*  ALT 679* 850*  ALKPHOS 182* 185*  BILITOT 1.3* 1.4*  PROT 6.4* 6.9  ALBUMIN 3.0* 3.2*    Acute kidney failure on CKD stage IV Likely prerenal -monitor with volume expansion I may speak with Ms. Annette Hickman please  Recent Labs  Lab 01/23/20 2300  CREATININE 2.98*    Right knee pain status post fall Was released from her SNF after a rehab stay on 4/15  Stage III right patellar ulcer  HTN  HLD  DM 2  DVT prophylaxis: SCDs Code Status: FULL CODE Family Communication: I called and  spoke with the patient's niece via phone today Disposition Plan: On December 28, 2019 the patient was discharged to an SNF -she certainly appears unsuitable for living independently  Consultants:  None  Antimicrobials:  None  Objective: Blood pressure (!) 145/78, pulse (!) 52, temperature 97.6 F (36.4 C), temperature source Oral, resp. rate 18, height 5\' 7"  (1.702 m), weight 52.6 kg, SpO2 95 %. No intake or output data in  the 24 hours ending 01/24/20 0834 Filed Weights   01/23/20 2148  Weight: 52.6 kg    Examination: Pt was seen for a f/u visit.    CBC: Recent Labs  Lab 01/23/20 2300  WBC 6.8  NEUTROABS 6.0  HGB 7.6*  HCT 24.8*  MCV 95.4  PLT 756   Basic Metabolic Panel: Recent Labs  Lab 01/23/20 2300  NA 140  K 3.7  CL 103  CO2 23  GLUCOSE 164*  BUN 112*  CREATININE 2.98*  CALCIUM 8.5*   GFR: Estimated Creatinine Clearance: 10.8 mL/min (A) (by C-G formula based on SCr of 2.98 mg/dL (H)).  Liver Function Tests: Recent Labs  Lab 01/23/20 2300  AST 1,310*  ALT 679*  ALKPHOS 182*  BILITOT 1.3*  PROT 6.4*  ALBUMIN 3.0*   No results for input(s): LIPASE, AMYLASE in the last 168 hours. No results for input(s): AMMONIA in the last 168 hours.  Coagulation Profile: No results for input(s): INR, PROTIME in the last 168 hours.  Cardiac Enzymes: No results for input(s): CKTOTAL, CKMB, CKMBINDEX, TROPONINI in the last 168 hours.  HbA1C: Hgb A1c MFr Bld  Date/Time Value Ref Range Status  03/27/2019 05:11 AM 5.5 4.8 - 5.6 % Final    Comment:    (NOTE) Pre diabetes:          5.7%-6.4% Diabetes:              >6.4% Glycemic control for   <7.0% adults with diabetes   10/12/2018 10:55 AM 6.2 (H) 4.8 - 5.6 % Final    Comment:             Prediabetes: 5.7 - 6.4          Diabetes: >6.4          Glycemic control for adults with diabetes: <7.0     CBG: No results for input(s): GLUCAP in the last 168 hours.  Recent Results (from the past 240 hour(s))  Respiratory Panel by RT PCR (Flu A&B, Covid) - Nasopharyngeal Swab     Status: None   Collection Time: 01/23/20 11:47 PM   Specimen: Nasopharyngeal Swab  Result Value Ref Range Status   SARS Coronavirus 2 by RT PCR NEGATIVE NEGATIVE Final    Comment: (NOTE) SARS-CoV-2 target nucleic acids are NOT DETECTED. The SARS-CoV-2 RNA is generally detectable in upper respiratoy specimens during the acute phase of infection. The  lowest concentration of SARS-CoV-2 viral copies this assay can detect is 131 copies/mL. A negative result does not preclude SARS-Cov-2 infection and should not be used as the sole basis for treatment or other patient management decisions. A negative result may occur with  improper specimen collection/handling, submission of specimen other than nasopharyngeal swab, presence of viral mutation(s) within the areas targeted by this assay, and inadequate number of viral copies (<131 copies/mL). A negative result must be combined with clinical observations, patient history, and epidemiological information. The expected result is Negative. Fact Sheet for Patients:  PinkCheek.be Fact Sheet for Healthcare Providers:  GravelBags.it This test is not yet ap  proved or cleared by the [place] and  has been authorized for detection and/or diagnosis of SARS-CoV-2 by FDA under an Emergency Use Authorization (EUA). This EUA will remain  in effect (meaning this test can be used) for the duration of the COVID-19 declaration under Section 564(b)(1) of the Act, 21 U.S.C. section 360bbb-3(b)(1), unless the authorization is terminated or revoked sooner.    Influenza A by PCR NEGATIVE NEGATIVE Final   Influenza B by PCR NEGATIVE NEGATIVE Final    Comment: (NOTE) The Xpert Xpress SARS-CoV-2/FLU/RSV assay is intended as an aid in  the diagnosis of influenza from Nasopharyngeal swab specimens and  should not be used as a sole basis for treatment. Nasal washings and  aspirates are unacceptable for Xpert Xpress SARS-CoV-2/FLU/RSV  testing. Fact Sheet for Patients: PinkCheek.be Fact Sheet for Healthcare Providers: GravelBags.it This test is not yet approved or cleared by the [place] FDA and  has been authorized for detection and/or diagnosis of SARS-CoV-2 by  FDA under an Emergency  Use Authorization (EUA). This EUA will remain  in effect (meaning this test can be used) for the duration of the  Covid-19 declaration under Section 564(b)(1) of the Act, 21  U.S.C. section 360bbb-3(b)(1), unless the authorization is  terminated or revoked. Performed at [facility], [place] [address] [place], [place] [address]      Scheduled Meds: . [START ON 01/25/2020] furosemide  40 mg Intravenous BID  . insulin aspart  0-9 Units Subcutaneous TID WC  . pantoprazole (PROTONIX) IV  40 mg Intravenous QHS    LOS: 0 days   Time spent: No Charge  Cherene Altes, MD [facility] Office  [phone] Pager - Text Page per Amion as per below:  On-Call/Text Page:      [email or web address]  If 7PM-7AM, please contact night-coverage [email or web address] 01/24/2020, 8:34 AM

## 2020-01-24 NOTE — Consult Note (Signed)
WOC Nurse Consult Note: Patient receiving care in [facility] ED23. Reason for Consult: right knee (patella) wound Wound type: patient states she fell causing the two wounds Pressure Injury POA: Yes/No/NA Measurement: Wound bed: The one on the patella is dry, yellow, and measures 1.2 cm x 1.8 cm x 0.2 cm.  The one below it is superficial and resembles a skin tear/abrasion.  It is moist, pink, and measures 3 cm x 2.8 cm x no measurable depth. Drainage (amount, consistency, odor)  Periwound: Dressing procedure/placement/frequency: Gentle cleansing with soap and water, then petroleum and foam dressing daily. Monitor the wound area(s) for worsening of condition such as: Signs/symptoms of infection,  Increase in size,  Development of or worsening of odor, Development of pain, or increased pain at the affected locations.  Notify the medical team if any of these develop.  Thank you for the consult. [facility] nurse will not follow at this time.  Please re-consult the [place] team if needed.  Val Riles, RN, MSN, CWOCN, CNS-BC, pager [phone]

## 2020-01-24 NOTE — ED Notes (Signed)
Pt is getting blood products at this moment and unable to get labs

## 2020-01-25 ENCOUNTER — Inpatient Hospital Stay (HOSPITAL_COMMUNITY): Payer: Medicare Other

## 2020-01-25 DIAGNOSIS — K7010 Alcoholic hepatitis without ascites: Secondary | ICD-10-CM

## 2020-01-25 LAB — URINE CULTURE

## 2020-01-25 LAB — COMPREHENSIVE METABOLIC PANEL
ALT: 843 U/L — ABNORMAL HIGH (ref 0–44)
AST: 1080 U/L — ABNORMAL HIGH (ref 15–41)
Albumin: 2.8 g/dL — ABNORMAL LOW (ref 3.5–5.0)
Alkaline Phosphatase: 163 U/L — ABNORMAL HIGH (ref 38–126)
Anion gap: 14 (ref 5–15)
BUN: 105 mg/dL — ABNORMAL HIGH (ref 8–23)
CO2: 20 mmol/L — ABNORMAL LOW (ref 22–32)
Calcium: 8.3 mg/dL — ABNORMAL LOW (ref 8.9–10.3)
Chloride: 109 mmol/L (ref 98–111)
Creatinine, Ser: 2.75 mg/dL — ABNORMAL HIGH (ref 0.44–1.00)
GFR calc Af Amer: 17 mL/min — ABNORMAL LOW (ref >60)
GFR calc non Af Amer: 15 mL/min — ABNORMAL LOW (ref >60)
Glucose, Bld: 100 mg/dL — ABNORMAL HIGH (ref 70–99)
Potassium: 3.9 mmol/L (ref 3.5–5.1)
Sodium: 143 mmol/L (ref 135–145)
Total Bilirubin: 1.3 mg/dL — ABNORMAL HIGH (ref 0.3–1.2)
Total Protein: 6.1 g/dL — ABNORMAL LOW (ref 6.5–8.1)

## 2020-01-25 LAB — TYPE AND SCREEN
ABO/RH(D): O POS
Antibody Screen: NEGATIVE
Unit division: 0

## 2020-01-25 LAB — GLUCOSE, CAPILLARY
Glucose-Capillary: 103 mg/dL — ABNORMAL HIGH (ref 70–99)
Glucose-Capillary: 179 mg/dL — ABNORMAL HIGH (ref 70–99)
Glucose-Capillary: 165 mg/dL — ABNORMAL HIGH (ref 70–99)
Glucose-Capillary: 124 mg/dL — ABNORMAL HIGH (ref 70–99)

## 2020-01-25 LAB — HEPATITIS PANEL, ACUTE
HCV Ab: NONREACTIVE
Hep A IgM: REACTIVE — AB
Hep B C IgM: NONREACTIVE
Hepatitis B Surface Ag: NONREACTIVE

## 2020-01-25 LAB — CBC
HCT: 33.2 % — ABNORMAL LOW (ref 36.0–46.0)
Hemoglobin: 10.3 g/dL — ABNORMAL LOW (ref 12.0–15.0)
MCH: 28.5 pg (ref 26.0–34.0)
MCHC: 31 g/dL (ref 30.0–36.0)
MCV: 92 fL (ref 80.0–100.0)
Platelets: 150 10*3/uL (ref 150–400)
RBC: 3.61 MIL/uL — ABNORMAL LOW (ref 3.87–5.11)
RDW: 16.9 % — ABNORMAL HIGH (ref 11.5–15.5)
WBC: 6.4 10*3/uL (ref 4.0–10.5)
nRBC: 0 % (ref 0.0–0.2)

## 2020-01-25 LAB — BPAM RBC
Blood Product Expiration Date: 202105282359
ISSUE DATE / TIME: 202104260007
Unit Type and Rh: 5100

## 2020-01-25 MED ORDER — HALOPERIDOL LACTATE 5 MG/ML IJ SOLN
5.0000 mg | Freq: Four times a day (QID) | INTRAMUSCULAR | Status: AC | PRN
  Administered 2020-01-28: 23:00:00 5 mg via INTRAMUSCULAR
  Filled 2020-01-25: qty 1

## 2020-01-25 MED ORDER — HALOPERIDOL 1 MG PO TABS
1.0000 mg | ORAL_TABLET | Freq: Four times a day (QID) | ORAL | Status: AC | PRN
  Filled 2020-01-25: qty 1

## 2020-01-25 MED ORDER — HYDRALAZINE HCL 25 MG PO TABS
25.0000 mg | ORAL_TABLET | Freq: Three times a day (TID) | ORAL | Status: DC
  Administered 2020-01-25 – 2020-01-26 (×4): 25 mg via ORAL
  Filled 2020-01-25 (×6): qty 1

## 2020-01-25 NOTE — Progress Notes (Signed)
PROGRESS NOTE  Gina [name] FMB:[id number] DOB: 1929-02-09 DOA: 01/23/2020 PCP: [name], NP  Brief Narrative:  86 year old with a history of gastric ulcer/AV malformations, PVD status post bilateral femoropopliteal bypasses, chronic hypoxic respiratory failure on 2 L, HTN, HLD, DM, CKD stage IV, mesenteric ischemia, Graves' disease, and diastolic congestive heart failure who presented to the ED complaining of weakness.  The patient was very lethargic and unable to provide a reliable history.  She did report significant lower extremity swelling right greater than left for at least 48 hours as well as dyspnea on exertion.  Reportedly she lives alone and EMS had to break into her home to care for her.  She was found to be saturating 80% on room air with a fever of 102.2.  The patient was discharged 3/30 after an admit for symptomatic anemia with a hemoglobin of 7 > 10.  She was transfused 1 unit of PRBCs and underwent an EGD which noted multiple large angiodysplastic lesions with bleeding in the gastric fundus and body that was treated.  She was also confirmed to have angiodysplastic lesions in the duodenum and jejunum with the plan being to repeat an EGD in 2 weeks.   HPI/Recap of past 24 hours:  She is pleasant, denies pain Slightly confused, not oriented to the month  Fever appear has resolved, no nausea no vomiting reported No diarrhea  Assessment/Plan: Principal Problem:   Acute GI bleeding Active Problems:   HLD (hyperlipidemia)   Essential hypertension   Diabetes mellitus, type 2 (HCC)   Acute metabolic encephalopathy   Acute on chronic diastolic CHF (congestive heart failure) (HCC)   Acute on chronic respiratory failure with hypoxia (HCC)   Transaminitis   Acute kidney injury superimposed on chronic kidney disease (HCC)   Recurrent acute on chronic symptomatic anemia, history of GI AVMs -Hemoglobin 7.6 on presentation -s/p 1 unit of PRBCs on presentation  -Hemoglobin has been stable since transfusion, Hgb 10.0   -Currently on Protonix drip -GI consulted  Acute hepatitis A with acute liver failure -Presented with fever, acute elevated LFT -Hepatitis A IgM positive -GI consulted  Acute metabolic encephalopathy -Present with lethargy, improving, alert, though not oriented to time, not sure if this is baseline   Acute on chronic hypoxic respiratory failure , baseline on 2 L -Required 4-5 L in the hospital -She does not appear in respiratory distress -:Chest x-ray "stable bilateral pleural effusions are noted with associated bilateral pulmonary edema or atelectasis." -Ultrasound showed small right pleural effusion -Echocardiogram left ventricular EF 55%, Left ventricular diastolic parameters are  consistent with Grade III diastolic dysfunction (restrictive).  Trivial pericardial effusion  -on physical exam she does not appear volume overloaded, with AKI, will hold off on diuretic for now -Close monitor volume status  AKI on CKD4 -Urine culture with multiple species none dominant -order renal ultrasound -Renal dosing meds  HTN: Home medication Norvasc hydralazine held since admission BP around 150 ,restart low-dose hydralazine    Diet controlled diabetes A.m. blood glucose 100 A1c 6 Continue diet control  PVD status post bilateral femoropopliteal bypasses Continue Lipitor Not on aspirin likely due to GI bleed  Right knee pain status post fall Was released from her SNF after a rehab stay on 4/15  Stage III right patellar ulcer  Body mass index is 20.36 kg/m.   FTT, pending therapy eval, may need skilled nursing facility again  DVT Prophylaxis: SCDs  Code Status: Full  Family Communication: patient   Disposition Plan:  Patient came from:                     Home                                                                                      Anticipated d/c place:  TBD  Barriers to d/c OR conditions  which need to be met to effect a safe d/c:  Acute liver failure, acute kidney failure, not ready to discharge  Consultants:  GI  Procedures:  PRBC transfusion  Antibiotics:  None   Objective: BP (!) 159/57 (BP Location: Right Arm)   Pulse 60   Temp (!) 97.2 F (36.2 C) (Axillary)   Resp (!) 21   Ht '5\' 7"'  (1.702 m)   Wt 59 kg   SpO2 94%   BMI 20.36 kg/m   Intake/Output Summary (Last 24 hours) at 01/25/2020 1951 Last data filed at 01/25/2020 1048 Gross per 24 hour  Intake 860.92 ml  Output 500 ml  Net 360.92 ml   Filed Weights   01/23/20 2148 01/24/20 1400 01/25/20 0406  Weight: 52.6 kg 54.4 kg 59 kg    Exam: Patient is examined daily including today on 01/25/2020, exams remain the same as of yesterday except that has changed    General: Frail, slightly confused , not in acute distress, alert and interactive  Cardiovascular: RRR  Respiratory: Diminished at bases, no rales, no rhonchi, no wheezing  Abdomen: Soft/ND/NT, positive BS  Musculoskeletal: No Edema  Neuro: alert, confused about a month  Data Reviewed: Basic Metabolic Panel: Recent Labs  Lab 01/23/20 2300 01/24/20 1138 01/25/20 0710  NA 140 141 143  K 3.7 3.5 3.9  CL 103 103 109  CO2 23 25 20*  GLUCOSE 164* 105* 100*  BUN 112* 109* 105*  CREATININE 2.98* 2.87* 2.75*  CALCIUM 8.5* 8.7* 8.3*   Liver Function Tests: Recent Labs  Lab 01/23/20 2300 01/24/20 1138 01/25/20 0710  AST 1,310* 1,477* 1,080*  ALT 679* 850* 843*  ALKPHOS 182* 185* 163*  BILITOT 1.3* 1.4* 1.3*  PROT 6.4* 6.9 6.1*  ALBUMIN 3.0* 3.2* 2.8*   No results for input(s): LIPASE, AMYLASE in the last 168 hours. Recent Labs  Lab 01/24/20 1127  AMMONIA 23   CBC: Recent Labs  Lab 01/23/20 2300 01/24/20 1138 01/24/20 1551 01/25/20 0942  WBC 6.8 6.4 7.0 6.4  NEUTROABS 6.0  --   --   --   HGB 7.6* 9.9* 10.1* 10.3*  HCT 24.8* 31.4* 31.5* 33.2*  MCV 95.4 90.0 89.7 92.0  PLT 225 180 163 150   Cardiac Enzymes:    Recent Labs  Lab 01/24/20 1127  CKTOTAL 443*   BNP (last 3 results) Recent Labs    10/26/19 1027 12/23/19 1624 01/23/20 2300  BNP 756.7* 724.7* 2,946.3*    ProBNP (last 3 results) No results for input(s): PROBNP in the last 8760 hours.  CBG: Recent Labs  Lab 01/24/20 1702 01/24/20 2120 01/25/20 0739 01/25/20 1201 01/25/20 1629  GLUCAP 115* 148* 103* 179* 165*    Recent Results (from the past 240 hour(s))  Respiratory Panel by RT PCR (Flu A&B,  Covid) - Nasopharyngeal Swab     Status: None   Collection Time: 01/23/20 11:47 PM   Specimen: Nasopharyngeal Swab  Result Value Ref Range Status   SARS Coronavirus 2 by RT PCR NEGATIVE NEGATIVE Final    Comment: (NOTE) SARS-CoV-2 target nucleic acids are NOT DETECTED. The SARS-CoV-2 RNA is generally detectable in upper respiratoy specimens during the acute phase of infection. The lowest concentration of SARS-CoV-2 viral copies this assay can detect is 131 copies/mL. A negative result does not preclude SARS-Cov-2 infection and should not be used as the sole basis for treatment or other patient management decisions. A negative result may occur with  improper specimen collection/handling, submission of specimen other than nasopharyngeal swab, presence of viral mutation(s) within the areas targeted by this assay, and inadequate number of viral copies (<131 copies/mL). A negative result must be combined with clinical observations, patient history, and epidemiological information. The expected result is Negative. Fact Sheet for Patients:  PinkCheek.be Fact Sheet for Healthcare Providers:  GravelBags.it This test is not yet ap proved or cleared by the [place] FDA and  has been authorized for detection and/or diagnosis of SARS-CoV-2 by FDA under an Emergency Use Authorization (EUA). This EUA will remain  in effect (meaning this test can be used) for the duration of  the COVID-19 declaration under Section 564(b)(1) of the Act, 21 U.S.C. section 360bbb-3(b)(1), unless the authorization is terminated or revoked sooner.    Influenza A by PCR NEGATIVE NEGATIVE Final   Influenza B by PCR NEGATIVE NEGATIVE Final    Comment: (NOTE) The Xpert Xpress SARS-CoV-2/FLU/RSV assay is intended as an aid in  the diagnosis of influenza from Nasopharyngeal swab specimens and  should not be used as a sole basis for treatment. Nasal washings and  aspirates are unacceptable for Xpert Xpress SARS-CoV-2/FLU/RSV  testing. Fact Sheet for Patients: PinkCheek.be Fact Sheet for Healthcare Providers: GravelBags.it This test is not yet approved or cleared by the [place] FDA and  has been authorized for detection and/or diagnosis of SARS-CoV-2 by  FDA under an Emergency Use Authorization (EUA). This EUA will remain  in effect (meaning this test can be used) for the duration of the  Covid-19 declaration under Section 564(b)(1) of the Act, 21  U.S.C. section 360bbb-3(b)(1), unless the authorization is  terminated or revoked. Performed at [facility], [place] [address] [place], [place] [address]   Urine culture     Status: Abnormal   Collection Time: 01/24/20 11:55 AM   Specimen: Urine, Random  Result Value Ref Range Status   Specimen Description URINE, RANDOM  Final   Special Requests   Final    NONE Performed at [facility], [place] [address] [place], [place] [address]    Culture MULTIPLE SPECIES PRESENT, SUGGEST RECOLLECTION (A)  Final   Report Status 01/25/2020 FINAL  Final  MRSA PCR Screening     Status: None   Collection Time: 01/24/20  2:02 PM   Specimen: Nasopharyngeal  Result Value Ref Range Status   MRSA by PCR NEGATIVE NEGATIVE Final    Comment:        The GeneXpert MRSA Assay (FDA approved for NASAL specimens only), is one component of a comprehensive MRSA colonization surveillance  program. It is not intended to diagnose MRSA infection nor to guide or monitor treatment for MRSA infections. Performed at [facility], [place] [address] [place], [place] [address]      Studies: DG Chest [facility]ler 1 [address]  Result Date: 01/25/2020 CLINICAL DATA:  Pleural effusion. EXAM: PORTABLE CHEST 1 VIEW COMPARISON:  January 24, 2020. FINDINGS: Stable cardiomediastinal silhouette. Atherosclerosis of thoracic aorta is noted. Stable bilateral pleural effusions are noted, with associated bilateral pulmonary edema or atelectasis. Bony thorax is unremarkable. IMPRESSION: Stable bilateral pleural effusions are noted with associated bilateral pulmonary edema or atelectasis. Aortic Atherosclerosis (ICD10-I70.0). Electronically Signed   By: Marijo Conception M.D.   On: 01/25/2020 09:18    Scheduled Meds: . insulin aspart  0-9 Units Subcutaneous TID WC  . latanoprost  1 drop Both Eyes QHS  . levothyroxine  112 mcg Oral Daily  . Netarsudil Dimesylate  1 drop Both Eyes QHS  . [START ON 01/27/2020] pantoprazole  40 mg Intravenous Q12H    Continuous Infusions: . sodium chloride 75 mL/hr at 01/25/20 0730  . pantoprozole (PROTONIX) infusion 8 mg/hr (01/25/20 0730)     Time spent: 37mns, case discussed with GI I have personally reviewed and interpreted on  01/25/2020 daily labs, tele strips, imagings as discussed above under date review session and assessment and plans.  I reviewed all nursing notes, pharmacy notes, consultant notes,  vitals, pertinent old records  I have discussed plan of care as described above with RN , patient on 01/25/2020   FFlorencia ReasonsMD, PhD, FACP  [facility]  Available via Epic secure chat 7am-7pm for nonurgent issues Please page for urgent issues, pager number available through [email or web address] .   01/25/2020, 7:51 PM  LOS: 1 day

## 2020-01-25 NOTE — Plan of Care (Signed)
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
## 2020-01-25 NOTE — Progress Notes (Addendum)
Pt slept comfortably and quietly all night and in good spirits when checked on periodically, lab attempted to draw and pt rudely refused, pt now refusing all IV medications and is verbally and physically aggressive, attempted to comfort her but it was ineffective, will attempt to restart IV's later. Attempted to communicate why pt was so upset and pt continued with verbal threats.  IV fluids and pantoprazole on hold. Will notify MD.

## 2020-01-26 ENCOUNTER — Inpatient Hospital Stay (HOSPITAL_COMMUNITY): Payer: Medicare Other

## 2020-01-26 LAB — CBC WITH DIFFERENTIAL/PLATELET
Abs Immature Granulocytes: 0.03 10*3/uL (ref 0.00–0.07)
Basophils Absolute: 0 10*3/uL (ref 0.0–0.1)
Basophils Relative: 0 %
Eosinophils Absolute: 0.2 10*3/uL (ref 0.0–0.5)
Eosinophils Relative: 3 %
HCT: 30.6 % — ABNORMAL LOW (ref 36.0–46.0)
Hemoglobin: 9.7 g/dL — ABNORMAL LOW (ref 12.0–15.0)
Immature Granulocytes: 1 %
Lymphocytes Relative: 9 %
Lymphs Abs: 0.5 10*3/uL — ABNORMAL LOW (ref 0.7–4.0)
MCH: 28.4 pg (ref 26.0–34.0)
MCHC: 31.7 g/dL (ref 30.0–36.0)
MCV: 89.7 fL (ref 80.0–100.0)
Monocytes Absolute: 0.5 10*3/uL (ref 0.1–1.0)
Monocytes Relative: 8 %
Neutro Abs: 5 10*3/uL (ref 1.7–7.7)
Neutrophils Relative %: 79 %
Platelets: 119 10*3/uL — ABNORMAL LOW (ref 150–400)
RBC: 3.41 MIL/uL — ABNORMAL LOW (ref 3.87–5.11)
RDW: 16.4 % — ABNORMAL HIGH (ref 11.5–15.5)
WBC: 6.2 10*3/uL (ref 4.0–10.5)
nRBC: 0.3 % — ABNORMAL HIGH (ref 0.0–0.2)

## 2020-01-26 LAB — GLUCOSE, CAPILLARY
Glucose-Capillary: 108 mg/dL — ABNORMAL HIGH (ref 70–99)
Glucose-Capillary: 120 mg/dL — ABNORMAL HIGH (ref 70–99)
Glucose-Capillary: 200 mg/dL — ABNORMAL HIGH (ref 70–99)
Glucose-Capillary: 134 mg/dL — ABNORMAL HIGH (ref 70–99)

## 2020-01-26 LAB — COMPREHENSIVE METABOLIC PANEL
ALT: 546 U/L — ABNORMAL HIGH (ref 0–44)
AST: 356 U/L — ABNORMAL HIGH (ref 15–41)
Albumin: 2.5 g/dL — ABNORMAL LOW (ref 3.5–5.0)
Alkaline Phosphatase: 133 U/L — ABNORMAL HIGH (ref 38–126)
Anion gap: 10 (ref 5–15)
BUN: 92 mg/dL — ABNORMAL HIGH (ref 8–23)
CO2: 24 mmol/L (ref 22–32)
Calcium: 8 mg/dL — ABNORMAL LOW (ref 8.9–10.3)
Chloride: 106 mmol/L (ref 98–111)
Creatinine, Ser: 2.52 mg/dL — ABNORMAL HIGH (ref 0.44–1.00)
GFR calc Af Amer: 19 mL/min — ABNORMAL LOW (ref >60)
GFR calc non Af Amer: 16 mL/min — ABNORMAL LOW (ref >60)
Glucose, Bld: 95 mg/dL (ref 70–99)
Potassium: 3 mmol/L — ABNORMAL LOW (ref 3.5–5.1)
Sodium: 140 mmol/L (ref 135–145)
Total Bilirubin: 1.2 mg/dL (ref 0.3–1.2)
Total Protein: 5.9 g/dL — ABNORMAL LOW (ref 6.5–8.1)

## 2020-01-26 LAB — PROTIME-INR
INR: 1.4 — ABNORMAL HIGH (ref 0.8–1.2)
Prothrombin Time: 16.5 seconds — ABNORMAL HIGH (ref 11.4–15.2)

## 2020-01-26 MED ORDER — POTASSIUM CHLORIDE CRYS ER 20 MEQ PO TBCR: 40.0000 meq | EXTENDED_RELEASE_TABLET | ORAL | Status: DC

## 2020-01-26 MED ORDER — GUAIFENESIN ER 600 MG PO TB12
600.0000 mg | ORAL_TABLET | Freq: Two times a day (BID) | ORAL | Status: DC
  Administered 2020-01-26 – 2020-02-03 (×16): 600 mg via ORAL
  Filled 2020-01-26 (×16): qty 1

## 2020-01-26 MED ORDER — IPRATROPIUM-ALBUTEROL 0.5-2.5 (3) MG/3ML IN SOLN: 3.0000 mL | Freq: Three times a day (TID) | RESPIRATORY_TRACT | Status: DC

## 2020-01-26 MED ORDER — FUROSEMIDE 10 MG/ML IJ SOLN
10.0000 mg/h | INTRAVENOUS | Status: AC
  Administered 2020-01-26: 10 mg/h via INTRAVENOUS
  Filled 2020-01-26: qty 25

## 2020-01-26 MED ORDER — POTASSIUM CHLORIDE 20 MEQ PO PACK
40.0000 meq | PACK | ORAL | Status: AC
  Administered 2020-01-26 (×2): 40 meq via ORAL
  Filled 2020-01-26 (×2): qty 2

## 2020-01-26 NOTE — Plan of Care (Signed)
  Problem: Health Behavior/Discharge Planning: Goal: Ability to manage health-related needs will improve Outcome: Not Progressing   Problem: Education: Goal: Knowledge of General Education information will improve Description: Including pain rating scale, medication(s)/side effects and non-pharmacologic comfort measures Outcome: Not Progressing

## 2020-01-26 NOTE — Evaluation (Signed)
Physical Therapy Evaluation Patient Details Name: Gina [name] MRN: [id number] DOB: 1929/07/15 Today's Date: 01/26/2020   History of Present Illness  86 yo female with onset of another fall at home after discharge from rehab on 4/15, now in acute respiratory failure, acute Hep A with liver failure, acute metabolic encephalopathy, and has transaminase.  Has AKI, FTT, CHF, referred to PT for ck to see if further rehab stay is needed.  PMHx:  CHF, DM, HTN, PVD, HLD, CKD IIIb, anemia, breast CA s/p mastectomy,  multiple large angiodysplastic lesions with bleeding in gastric fundus/body , angiodysplastic lesion without bleeding in duodenum and jejunum.    Clinical Impression  Pt was seen for mobility evaluation and is feeling too weak to get OOB.  Reports feeling chilly as well from her anemia, and will need to work on further progression of transfers and gait when pt can allow.  Follow acutely to get OOB and work on standing balance control next session as pt permits.    Follow Up Recommendations SNF    Equipment Recommendations  None recommended by PT    Recommendations for Other Services       Precautions / Restrictions Precautions Precautions: Fall Precaution Comments: monitor pulses, sats Restrictions Weight Bearing Restrictions: No      Mobility  Bed Mobility Overal bed mobility: Needs Assistance Bed Mobility: Supine to Sit     Supine to sit: Mod assist     General bed mobility comments: mod to sit up in bed, refused to stand  Transfers                 General transfer comment: declined to try  Ambulation/Gait             General Gait Details: declined  Stairs            Wheelchair Mobility    Modified Rankin (Stroke Patients Only)       Balance Overall balance assessment: History of Falls;Needs assistance Sitting-balance support: Feet supported Sitting balance-Leahy Scale: Poor                                        Pertinent Vitals/Pain Pain Assessment: Faces Faces Pain Scale: Hurts little more Pain Location: generally with sitting up, L ankle with touch Pain Descriptors / Indicators: Guarding;Sharp Pain Intervention(s): Limited activity within patient's tolerance;Monitored during session;Repositioned    Home Living Family/patient expects to be discharged to:: Private residence Living Arrangements: Alone Available Help at Discharge: Family;Personal care attendant;Available PRN/intermittently Type of Home: Apartment Home Access: Level entry     Home Layout: One level Home Equipment: Walker - 2 wheels;Cane - single point;Grab bars - tub/shower;Shower seat Additional Comments: reports L ankle bruise from first fall, second fall was to floor from commode, overnight in floor before EMT's arrived    Prior Function Level of Independence: Needs assistance   Gait / Transfers Assistance Needed: RW with independence  ADL's / Homemaking Assistance Needed: aide 3 hours a day for bathing and dressing, housework; niece does errands, pt does cooking        Hand Dominance   Dominant Hand: Right    Extremity/Trunk Assessment   Upper Extremity Assessment Upper Extremity Assessment: Generalized weakness    Lower Extremity Assessment Lower Extremity Assessment: Generalized weakness    Cervical / Trunk Assessment Cervical / Trunk Assessment: Kyphotic  Communication   Communication: HOH  Cognition Arousal/Alertness:  Awake/alert Behavior During Therapy: WFL for tasks assessed/performed Overall Cognitive Status: Within Functional Limits for tasks assessed                                 General Comments: able to give history but takes time to get to the answer to question      General Comments General comments (skin integrity, edema, etc.): pt is not feeling well, declining to try to move off bed and chilled due to anemia    Exercises     Assessment/Plan    PT  Assessment Patient needs continued PT services  PT Problem List Decreased strength;Decreased range of motion;Decreased activity tolerance;Decreased balance;Decreased mobility;Decreased coordination;Cardiopulmonary status limiting activity;Decreased skin integrity;Pain       PT Treatment Interventions DME instruction;Gait training;Functional mobility training;Therapeutic activities;Therapeutic exercise;Balance training;Neuromuscular re-education;Patient/family education    PT Goals (Current goals can be found in the Care Plan section)  Acute Rehab PT Goals Patient Stated Goal: to get stronger to get home PT Goal Formulation: With patient Time For Goal Achievement: 02/09/20 Potential to Achieve Goals: Good    Frequency Min 3X/week   Barriers to discharge Decreased caregiver support home alone at times    Co-evaluation               AM-PAC PT "6 Clicks" Mobility  Outcome Measure Help needed turning from your back to your side while in a flat bed without using bedrails?: A Little Help needed moving from lying on your back to sitting on the side of a flat bed without using bedrails?: A Lot Help needed moving to and from a bed to a chair (including a wheelchair)?: A Lot Help needed standing up from a chair using your arms (e.g., wheelchair or bedside chair)?: A Lot Help needed to walk in hospital room?: A Lot Help needed climbing 3-5 steps with a railing? : Total 6 Click Score: 12    End of Session Equipment Utilized During Treatment: Oxygen Activity Tolerance: Patient limited by fatigue;Treatment limited secondary to medical complications (Comment);Patient limited by pain Patient left: in bed;with call bell/phone within reach;with bed alarm set Nurse Communication: Mobility status PT Visit Diagnosis: Muscle weakness (generalized) (M62.81);History of falling (Z91.81);Difficulty in walking, not elsewhere classified (R26.2);Adult, failure to thrive (R62.7);Pain Pain - Right/Left:  Left Pain - part of body: Ankle and joints of foot    Time: 1340-1411 PT Time Calculation (min) (ACUTE ONLY): 31 min   Charges:   PT Evaluation $PT Eval Moderate Complexity: 1 Mod PT Treatments $Therapeutic Activity: 8-22 mins       Ramond Dial 01/26/2020, 2:35 PM  Mee Hives, PT MS Acute Rehab Dept. Number: [place] and [place]

## 2020-01-26 NOTE — H&P (View-Only) (Signed)
Subjective: No acute events.  Objective: Vital signs in last 24 hours: Temp:  [97.2 F (36.2 C)-97.9 F (36.6 C)] 97.6 F (36.4 C) (04/28 0732) Pulse Rate:  [46-64] 62 (04/28 0732) Resp:  [11-27] 27 (04/28 0732) BP: (95-159)/(47-76) 130/49 (04/28 0732) SpO2:  [91 %-97 %] 92 % (04/28 0732) Last BM Date: 01/23/20  Intake/Output from previous day: 04/27 0701 - 04/28 0700 In: 120 [P.O.:120] Out: 900 [Urine:900] Intake/Output this shift: No intake/output data recorded.  General appearance: alert, no distress and mildly confused Resp: clear to auscultation bilaterally Cardio: regular rate and rhythm GI: soft, non-tender; bowel sounds normal; no masses,  no organomegaly Extremities: extremities normal, atraumatic, no cyanosis or edema and no asterixis  Lab Results: Recent Labs    01/24/20 1551 01/25/20 0942 01/26/20 0541  WBC 7.0 6.4 6.2  HGB 10.1* 10.3* 9.7*  HCT 31.5* 33.2* 30.6*  PLT 163 150 119*   BMET Recent Labs    01/24/20 1138 01/25/20 0710 01/26/20 0541  NA 141 143 140  K 3.5 3.9 3.0*  CL 103 109 106  CO2 25 20* 24  GLUCOSE 105* 100* 95  BUN 109* 105* 92*  CREATININE 2.87* 2.75* 2.52*  CALCIUM 8.7* 8.3* 8.0*   LFT Recent Labs    01/26/20 0541  PROT 5.9*  ALBUMIN 2.5*  AST 356*  ALT 546*  ALKPHOS 133*  BILITOT 1.2   PT/INR Recent Labs    01/24/20 1138 01/26/20 0541  LABPROT 19.7* 16.5*  INR 1.7* 1.4*   Hepatitis Panel Recent Labs    01/24/20 1127  HEPBSAG NON REACTIVE  HCVAB NON REACTIVE  HEPAIGM Reactive*  HEPBIGM NON REACTIVE   C-Diff No results for input(s): CDIFFTOX in the last 72 hours. Fecal Lactopherrin No results for input(s): FECLLACTOFRN in the last 72 hours.  Studies/Results: US RENAL  Result Date: 01/25/2020 CLINICAL DATA:  Elevated creatinine EXAM: RENAL / URINARY TRACT ULTRASOUND COMPLETE COMPARISON:  01/15/2019 FINDINGS: Right Kidney: Renal measurements: 10.2 x 4.5 x 4.9 cm. = volume: 118 mL . Echogenicity  within normal limits. No mass or hydronephrosis visualized. Left Kidney: Renal measurements: 8.4 x 4.7 x 5.0 cm. = volume: 103 mL. Kidney is only partially visualized. No mass lesion or hydronephrosis is noted. Bladder: Bladder wall is thickened with internal septations. Other: None. IMPRESSION: Normal-appearing kidneys without hydronephrosis. Bladder wall thickening which may be related incomplete distension. A focal septation is noted within the bladder of uncertain significance. Direct visualization may be helpful as necessary. Electronically Signed   By: Inez Catalina M.D.   On: 01/25/2020 22:14   DG Chest Port 1 View  Result Date: 01/25/2020 CLINICAL DATA:  Pleural effusion. EXAM: PORTABLE CHEST 1 VIEW COMPARISON:  January 24, 2020. FINDINGS: Stable cardiomediastinal silhouette. Atherosclerosis of thoracic aorta is noted. Stable bilateral pleural effusions are noted, with associated bilateral pulmonary edema or atelectasis. Bony thorax is unremarkable. IMPRESSION: Stable bilateral pleural effusions are noted with associated bilateral pulmonary edema or atelectasis. Aortic Atherosclerosis (ICD10-I70.0). Electronically Signed   By: Marijo Conception M.D.   On: 01/25/2020 09:18   ECHOCARDIOGRAM COMPLETE  Result Date: 01/24/2020    ECHOCARDIOGRAM REPORT   Patient Name:   Gina [name] Date of Exam: 01/24/2020 Medical Rec #:  [id number]        Height:       67.0 in Accession #:    [id number]       Weight:       116.0 lb Date of Birth:  06/03/1929  BSA:          1.604 m Patient Age:    86 years         BP:           128/51 mmHg Patient Gender: F                HR:           62 bpm. Exam Location:  Inpatient Procedure: 2D Echo, Cardiac Doppler and Color Doppler Indications:    I50.33 Acute on chronic diastolic (congestive) heart failure  History:        Patient has prior history of Echocardiogram examinations, most                 recent 09/16/2019. Risk Factors:Hypertension, Diabetes and                  Dyslipidemia. Thyroid Disease.  Sonographer:    Tiffany Dance Referring Phys: [id number] [place]  1. Left ventricular ejection fraction, by estimation, is 55%. The left ventricle has normal function. The left ventricle has no regional wall motion abnormalities. There is mild left ventricular hypertrophy. Left ventricular diastolic parameters are consistent with Grade III diastolic dysfunction (restrictive). Elevated left ventricular end-diastolic pressure.  2. Right ventricular systolic function is normal. The right ventricular size is moderately enlarged. There is mildly elevated pulmonary artery systolic pressure.  3. Left atrial size was severely dilated.  4. Right atrial size was moderately dilated.  5. Large pleural effusion.  6. The mitral valve is normal in structure. Mild mitral valve regurgitation. No evidence of mitral stenosis.  7. Tricuspid valve regurgitation is mild to moderate.  8. The aortic valve is tricuspid. Aortic valve regurgitation is not visualized. No aortic stenosis is present.  9. The inferior vena cava is dilated in size with <50% respiratory variability, suggesting right atrial pressure of 15 mmHg. Comparison(s): A prior study was performed on 09/16/2019. Prior images reviewed side by side. RV/RA size has increased, TR has increased, RA pressure has increased. Similar appearance of large pleural effusion. FINDINGS  Left Ventricle: Left ventricular ejection fraction, by estimation, is 55%. The left ventricle has normal function. The left ventricle has no regional wall motion abnormalities. The left ventricular internal cavity size was normal in size. There is mild left ventricular hypertrophy. Left ventricular diastolic parameters are consistent with Grade III diastolic dysfunction (restrictive). Elevated left ventricular end-diastolic pressure. Right Ventricle: The right ventricular size is moderately enlarged. No increase in right ventricular wall thickness. Right ventricular  systolic function is normal. There is mildly elevated pulmonary artery systolic pressure. The tricuspid regurgitant velocity is 2.62 m/s, and with an assumed right atrial pressure of 15 mmHg, the estimated right ventricular systolic pressure is 45.4 mmHg. Left Atrium: Left atrial size was severely dilated. Right Atrium: Right atrial size was moderately dilated. Pericardium: Trivial pericardial effusion is present. Mitral Valve: The mitral valve is normal in structure. Normal mobility of the mitral valve leaflets. Mild mitral valve regurgitation. No evidence of mitral valve stenosis. Tricuspid Valve: The tricuspid valve is normal in structure. Tricuspid valve regurgitation is mild to moderate. No evidence of tricuspid stenosis. Aortic Valve: The aortic valve is tricuspid. Aortic valve regurgitation is not visualized. No aortic stenosis is present. Pulmonic Valve: The pulmonic valve was normal in structure. Pulmonic valve regurgitation is trivial. No evidence of pulmonic stenosis. Aorta: The aortic root is normal in size and structure. Venous: The inferior vena cava is dilated in size with  less than 50% respiratory variability, suggesting right atrial pressure of 15 mmHg. IAS/Shunts: No atrial level shunt detected by color flow Doppler. Additional Comments: There is a large pleural effusion.  LEFT VENTRICLE PLAX 2D LVIDd:         4.90 cm  Diastology LVIDs:         3.35 cm  LV e' lateral:   5.87 cm/s LV PW:         0.88 cm  LV E/e' lateral: 14.1 LV IVS:        0.75 cm  LV e' medial:    2.98 cm/s LVOT diam:     2.00 cm  LV E/e' medial:  27.8 LV SV:         58 LV SV Index:   36 LVOT Area:     3.14 cm  RIGHT VENTRICLE            IVC RV Basal diam:  2.70 cm    IVC diam: 2.69 cm RV S prime:     9.20 cm/s TAPSE (M-mode): 1.3 cm LEFT ATRIUM             Index       RIGHT ATRIUM           Index LA diam:        4.00 cm 2.49 cm/m  RA Area:     18.00 cm LA Vol (A2C):   96.8 ml 60.34 ml/m RA Volume:   51.50 ml  32.10 ml/m LA  Vol (A4C):   56.2 ml 35.03 ml/m LA Biplane Vol: 74.1 ml 46.19 ml/m  AORTIC VALVE LVOT Vmax:   73.45 cm/s LVOT Vmean:  48.000 cm/s LVOT VTI:    0.186 m  AORTA Ao Root diam: 3.30 cm Ao Asc diam:  2.70 cm MITRAL VALVE               TRICUSPID VALVE MV Area (PHT): 4.80 cm    TR Peak grad:   27.5 mmHg MV Decel Time: 158 msec    TR Vmax:        262.00 cm/s MV E velocity: 82.90 cm/s MV A velocity: 42.20 cm/s  SHUNTS MV E/A ratio:  1.96        Systemic VTI:  0.19 m                            Systemic Diam: 2.00 cm Cherlynn Kaiser MD Electronically signed by Cherlynn Kaiser MD Signature Date/Time: 01/24/2020/5:36:14 PM    Final     Medications:  Scheduled: . hydrALAZINE  25 mg Oral Q8H  . insulin aspart  0-9 Units Subcutaneous TID WC  . latanoprost  1 drop Both Eyes QHS  . levothyroxine  112 mcg Oral Daily  . Netarsudil Dimesylate  1 drop Both Eyes QHS  . [START ON 01/27/2020] pantoprazole  40 mg Intravenous Q12H   Continuous: . pantoprozole (PROTONIX) infusion 8 mg/hr (01/26/20 0400)    Assessment/Plan: 1) Acute HAV. 2) Fluctuating AMS. 3) ARF/CKD.   The patient's liver enzymes continue to decline.  Her INR also declined down to 1.4.  From the hepatic standpoint she is stable and continues to improve.  The current expectation is that she will resolve the acute HAV infection.  As for her AMS, she does have a change in her mental status this AM.  It is mild and she is easily corrected with reminders.  With the improvement in her liver enzymes  and synthetic function, her AMS is not felt to be as a result of her HAV infection.  Plan: 1) Continue with supportive care.  No specific interventions for her acute HAV.  LOS: 2 days   Charene Mccallister D 01/26/2020, 8:14 AM

## 2020-01-26 NOTE — Plan of Care (Signed)
  Problem: Education: Goal: Knowledge of General Education information will improve Description Including pain rating scale, medication(s)/side effects and non-pharmacologic comfort measures Outcome: Progressing   Problem: Health Behavior/Discharge Planning: Goal: Ability to manage health-related needs will improve Outcome: Progressing   

## 2020-01-26 NOTE — Progress Notes (Addendum)
PROGRESS NOTE  Gina [name] ZGY:[id number] DOB: 06/04/1929 DOA: 01/23/2020 PCP: [name], NP  Brief Narrative:  86 year old with a history of gastric ulcer/AV malformations, PVD status post bilateral femoropopliteal bypasses, chronic hypoxic respiratory failure on 2 L, HTN, HLD, DM, CKD stage IV, mesenteric ischemia, Graves' disease, and diastolic congestive heart failure who presented to the ED complaining of weakness.  The patient was very lethargic and unable to provide a reliable history.  She did report significant lower extremity swelling right greater than left for at least 48 hours as well as dyspnea on exertion.  Reportedly she lives alone and EMS had to break into her home to care for her.  She was found to be saturating 80% on room air with a fever of 102.2.  The patient was discharged 3/30 after an admit for symptomatic anemia with a hemoglobin of 7 > 10.  She was transfused 1 unit of PRBCs and underwent an EGD which noted multiple large angiodysplastic lesions with bleeding in the gastric fundus and body that was treated.  She was also confirmed to have angiodysplastic lesions in the duodenum and jejunum with the plan being to repeat an EGD in 2 weeks.   HPI/Recap of past 24 hours:  She is pleasant, denies pain Slightly confused, not oriented to the month  Fever appear has resolved, no nausea no vomiting reported No diarrhea    Assessment/Plan: Principal Problem:   Acute GI bleeding Active Problems:   HLD (hyperlipidemia)   Essential hypertension   Diabetes mellitus, type 2 (HCC)   Acute metabolic encephalopathy   Acute on chronic diastolic CHF (congestive heart failure) (HCC)   Acute on chronic respiratory failure with hypoxia (HCC)   Transaminitis   Acute kidney injury superimposed on chronic kidney disease ([place])   She developed intermittent wheezing and congested cough, no edema, she reports quit smoking 65yr ago, reports h/o copd but I donot see a  diagnosis in her chart, will get cxr, start mucinex, start nebs, she does not appear volume overloaded on exam   650pm addendum: Chest x-ray showed"Moderate to large layering bilateral pleural effusions with underlying consolidation." start Lasix drip in the setting of renal impairment   Recurrent acute on chronic symptomatic anemia, history of GI AVMs -Hemoglobin 7.6 on presentation, FOBT + -s/p 1 unit of PRBCs on presentation  -Hemoglobin has been stable since transfusion, Hgb 10.0  -9.7 -She is started on Protonix drip since admission, case discussed with GI Dr. HBenson Norwaywho recommended change to Protonix oral once a day , clear liquid for now ,keep n.p.o. after midnight in case EGD tomorrow   Acute hepatitis A with acute liver failure -Presented with fever, acute elevated LFT -Hepatitis A IgM positive -Improving -GI consulted  Acute metabolic encephalopathy -Present with lethargy, improving, alert, though not oriented to time, not sure if this is baseline   AKI on CKD4 -Urine culture with multiple species none dominant -renal ultrasound "Normal-appearing kidneys without hydronephrosis.  Bladder wall thickening which may be related incomplete distension. A focal septation is noted within the bladder of uncertain significance. Direct visualization may be helpful as necessary. -She denies urinary symptom, fever subsided, will monitor off antibiotics -Renal dosing meds, continue hold Lasix  Hypokalemia, replace K  Acute on chronic hypoxic respiratory failure , baseline on 2 L -Required 4-5 L in the hospital -She does not appear in respiratory distress -:Chest x-ray "stable bilateral pleural effusions are noted with associated bilateral pulmonary edema or atelectasis." -Ultrasound showed small  right pleural effusion -Echocardiogram left ventricular EF 55%, Left ventricular diastolic parameters are  consistent with Grade III diastolic dysfunction (restrictive).  Trivial  pericardial effusion  -on physical exam she does not appear volume overloaded, with AKI, will hold off on diuretic for now -Close monitor volume status  HTN: Home medication Norvasc hydralazine held since admission BP around 150 ,restart low-dose hydralazine    Diet controlled diabetes A.m. blood glucose 100 A1c 6 Continue diet control  PVD status post bilateral femoropopliteal bypasses Continue Lipitor Not on aspirin likely due to GI bleed  Right knee pain status post fall Was released from her SNF after a rehab stay on 4/15  Stage III right patellar ulcer  Body mass index is 20.36 kg/m.     FTT, pending therapy eval, may need skilled nursing facility again  DVT Prophylaxis: SCDs  Code Status: Full  Family Communication: niece annette at bedside with permission  Disposition Plan:    Patient came from:                     Home                                                                                      Anticipated d/c place:  TBD  Barriers to d/c OR conditions which need to be met to effect a safe d/c:  Acute liver failure, acute kidney failure, not ready to discharge  Consultants:  GI  Procedures:  PRBC transfusion  Antibiotics:  None   Objective: BP (!) 154/51 (BP Location: Right Arm)   Pulse 69   Temp (!) 97.3 F (36.3 C) (Oral)   Resp 14   Ht '5\' 7"'$  (1.702 m)   Wt 59 kg   SpO2 94%   BMI 20.36 kg/m   Intake/Output Summary (Last 24 hours) at 01/26/2020 1325 Last data filed at 01/26/2020 1100 Gross per 24 hour  Intake 240 ml  Output 550 ml  Net -310 ml   Filed Weights   01/23/20 2148 01/24/20 1400 01/25/20 0406  Weight: 52.6 kg 54.4 kg 59 kg    Exam: Patient is examined daily including today on 01/26/2020, exams remain the same as of yesterday except that has changed    General: Frail, slightly confused , not in acute distress, alert and interactive  Cardiovascular: RRR  Respiratory: Diminished at bases, no rales, no  rhonchi, no wheezing  Abdomen: Soft/ND/NT, positive BS  Musculoskeletal: No Edema  Neuro: alert, confused about a month  Data Reviewed: Basic Metabolic Panel: Recent Labs  Lab 01/23/20 2300 01/24/20 1138 01/25/20 0710 01/26/20 0541  NA 140 141 143 140  K 3.7 3.5 3.9 3.0*  CL 103 103 109 106  CO2 23 25 20* 24  GLUCOSE 164* 105* 100* 95  BUN 112* 109* 105* 92*  CREATININE 2.98* 2.87* 2.75* 2.52*  CALCIUM 8.5* 8.7* 8.3* 8.0*   Liver Function Tests: Recent Labs  Lab 01/23/20 2300 01/24/20 1138 01/25/20 0710 01/26/20 0541  AST 1,310* 1,477* 1,080* 356*  ALT 679* 850* 843* 546*  ALKPHOS 182* 185* 163* 133*  BILITOT 1.3* 1.4* 1.3* 1.2  PROT 6.4* 6.9 6.1* 5.9*  ALBUMIN 3.0* 3.2* 2.8* 2.5*   No results for input(s): LIPASE, AMYLASE in the last 168 hours. Recent Labs  Lab 01/24/20 1127  AMMONIA 23   CBC: Recent Labs  Lab 01/23/20 2300 01/24/20 1138 01/24/20 1551 01/25/20 0942 01/26/20 0541  WBC 6.8 6.4 7.0 6.4 6.2  NEUTROABS 6.0  --   --   --  5.0  HGB 7.6* 9.9* 10.1* 10.3* 9.7*  HCT 24.8* 31.4* 31.5* 33.2* 30.6*  MCV 95.4 90.0 89.7 92.0 89.7  PLT 225 180 163 150 119*   Cardiac Enzymes:   Recent Labs  Lab 01/24/20 1127  CKTOTAL 443*   BNP (last 3 results) Recent Labs    10/26/19 1027 12/23/19 1624 01/23/20 2300  BNP 756.7* 724.7* 2,946.3*    ProBNP (last 3 results) No results for input(s): PROBNP in the last 8760 hours.  CBG: Recent Labs  Lab 01/25/20 1201 01/25/20 1629 01/25/20 2247 01/26/20 0732 01/26/20 1121  GLUCAP 179* 165* 124* 108* 120*    Recent Results (from the past 240 hour(s))  Respiratory Panel by RT PCR (Flu A&B, Covid) - Nasopharyngeal Swab     Status: None   Collection Time: 01/23/20 11:47 PM   Specimen: Nasopharyngeal Swab  Result Value Ref Range Status   SARS Coronavirus 2 by RT PCR NEGATIVE NEGATIVE Final    Comment: (NOTE) SARS-CoV-2 target nucleic acids are NOT DETECTED. The SARS-CoV-2 RNA is generally  detectable in upper respiratoy specimens during the acute phase of infection. The lowest concentration of SARS-CoV-2 viral copies this assay can detect is 131 copies/mL. A negative result does not preclude SARS-Cov-2 infection and should not be used as the sole basis for treatment or other patient management decisions. A negative result may occur with  improper specimen collection/handling, submission of specimen other than nasopharyngeal swab, presence of viral mutation(s) within the areas targeted by this assay, and inadequate number of viral copies (<131 copies/mL). A negative result must be combined with clinical observations, patient history, and epidemiological information. The expected result is Negative. Fact Sheet for Patients:  PinkCheek.be Fact Sheet for Healthcare Providers:  GravelBags.it This test is not yet ap proved or cleared by the [place] FDA and  has been authorized for detection and/or diagnosis of SARS-CoV-2 by FDA under an Emergency Use Authorization (EUA). This EUA will remain  in effect (meaning this test can be used) for the duration of the COVID-19 declaration under Section 564(b)(1) of the Act, 21 U.S.C. section 360bbb-3(b)(1), unless the authorization is terminated or revoked sooner.    Influenza A by PCR NEGATIVE NEGATIVE Final   Influenza B by PCR NEGATIVE NEGATIVE Final    Comment: (NOTE) The Xpert Xpress SARS-CoV-2/FLU/RSV assay is intended as an aid in  the diagnosis of influenza from Nasopharyngeal swab specimens and  should not be used as a sole basis for treatment. Nasal washings and  aspirates are unacceptable for Xpert Xpress SARS-CoV-2/FLU/RSV  testing. Fact Sheet for Patients: PinkCheek.be Fact Sheet for Healthcare Providers: GravelBags.it This test is not yet approved or cleared by the [place] FDA and  has been  authorized for detection and/or diagnosis of SARS-CoV-2 by  FDA under an Emergency Use Authorization (EUA). This EUA will remain  in effect (meaning this test can be used) for the duration of the  Covid-19 declaration under Section 564(b)(1) of the Act, 21  U.S.C. section 360bbb-3(b)(1), unless the authorization is  terminated or revoked. Performed at [facility], [place] [address] [place], [place] [address]  Urine culture     Status: Abnormal   Collection Time: 01/24/20 11:55 AM   Specimen: Urine, Random  Result Value Ref Range Status   Specimen Description URINE, RANDOM  Final   Special Requests   Final    NONE Performed at [facility], [place] [address] [place], [place] [address]    Culture MULTIPLE SPECIES PRESENT, SUGGEST RECOLLECTION (A)  Final   Report Status 01/25/2020 FINAL  Final  MRSA PCR Screening     Status: None   Collection Time: 01/24/20  2:02 PM   Specimen: Nasopharyngeal  Result Value Ref Range Status   MRSA by PCR NEGATIVE NEGATIVE Final    Comment:        The GeneXpert MRSA Assay (FDA approved for NASAL specimens only), is one component of a comprehensive MRSA colonization surveillance program. It is not intended to diagnose MRSA infection nor to guide or monitor treatment for MRSA infections. Performed at [facility], [place] [address], [place], [place] [address]      Studies: US RENAL  Result Date: 01/25/2020 CLINICAL DATA:  Elevated creatinine EXAM: RENAL / URINARY TRACT ULTRASOUND COMPLETE COMPARISON:  01/15/2019 FINDINGS: Right Kidney: Renal measurements: 10.2 x 4.5 x 4.9 cm. = volume: 118 mL . Echogenicity within normal limits. No mass or hydronephrosis visualized. Left Kidney: Renal measurements: 8.4 x 4.7 x 5.0 cm. = volume: 103 mL. Kidney is only partially visualized. No mass lesion or hydronephrosis is noted. Bladder: Bladder wall is thickened with internal septations. Other: None. IMPRESSION: Normal-appearing kidneys without  hydronephrosis. Bladder wall thickening which may be related incomplete distension. A focal septation is noted within the bladder of uncertain significance. Direct visualization may be helpful as necessary. Electronically Signed   By: Inez Catalina M.D.   On: 01/25/2020 22:14    Scheduled Meds: . hydrALAZINE  25 mg Oral Q8H  . insulin aspart  0-9 Units Subcutaneous TID WC  . latanoprost  1 drop Both Eyes QHS  . levothyroxine  112 mcg Oral Daily  . Netarsudil Dimesylate  1 drop Both Eyes QHS  . [START ON 01/27/2020] pantoprazole  40 mg Intravenous Q12H  . potassium chloride  40 mEq Oral Q4H    Continuous Infusions: . pantoprozole (PROTONIX) infusion 8 mg/hr (01/26/20 0400)     Time spent: 34mns, case discussed with GI I have personally reviewed and interpreted on  01/26/2020 daily labs, tele strips, imagings as discussed above under date review session and assessment and plans.  I reviewed all nursing notes, pharmacy notes, consultant notes,  vitals, pertinent old records  I have discussed plan of care as described above with RN , patient on 01/26/2020   FFlorencia ReasonsMD, PhD, FACP  [facility]  Available via Epic secure chat 7am-7pm for nonurgent issues Please page for urgent issues, pager number available through [email or web address] .   01/26/2020, 1:25 PM  LOS: 2 days

## 2020-01-26 NOTE — Progress Notes (Signed)
Subjective: No acute events.  Objective: Vital signs in last 24 hours: Temp:  [97.2 F (36.2 C)-97.9 F (36.6 C)] 97.6 F (36.4 C) (04/28 0732) Pulse Rate:  [46-64] 62 (04/28 0732) Resp:  [11-27] 27 (04/28 0732) BP: (95-159)/(47-76) 130/49 (04/28 0732) SpO2:  [91 %-97 %] 92 % (04/28 0732) Last BM Date: 01/23/20  Intake/Output from previous day: 04/27 0701 - 04/28 0700 In: 120 [P.O.:120] Out: 900 [Urine:900] Intake/Output this shift: No intake/output data recorded.  General appearance: alert, no distress and mildly confused Resp: clear to auscultation bilaterally Cardio: regular rate and rhythm GI: soft, non-tender; bowel sounds normal; no masses,  no organomegaly Extremities: extremities normal, atraumatic, no cyanosis or edema and no asterixis  Lab Results: Recent Labs    01/24/20 1551 01/25/20 0942 01/26/20 0541  WBC 7.0 6.4 6.2  HGB 10.1* 10.3* 9.7*  HCT 31.5* 33.2* 30.6*  PLT 163 150 119*   BMET Recent Labs    01/24/20 1138 01/25/20 0710 01/26/20 0541  NA 141 143 140  K 3.5 3.9 3.0*  CL 103 109 106  CO2 25 20* 24  GLUCOSE 105* 100* 95  BUN 109* 105* 92*  CREATININE 2.87* 2.75* 2.52*  CALCIUM 8.7* 8.3* 8.0*   LFT Recent Labs    01/26/20 0541  PROT 5.9*  ALBUMIN 2.5*  AST 356*  ALT 546*  ALKPHOS 133*  BILITOT 1.2   PT/INR Recent Labs    01/24/20 1138 01/26/20 0541  LABPROT 19.7* 16.5*  INR 1.7* 1.4*   Hepatitis Panel Recent Labs    01/24/20 1127  HEPBSAG NON REACTIVE  HCVAB NON REACTIVE  HEPAIGM Reactive*  HEPBIGM NON REACTIVE   C-Diff No results for input(s): CDIFFTOX in the last 72 hours. Fecal Lactopherrin No results for input(s): FECLLACTOFRN in the last 72 hours.  Studies/Results: US RENAL  Result Date: 01/25/2020 CLINICAL DATA:  Elevated creatinine EXAM: RENAL / URINARY TRACT ULTRASOUND COMPLETE COMPARISON:  01/15/2019 FINDINGS: Right Kidney: Renal measurements: 10.2 x 4.5 x 4.9 cm. = volume: 118 mL . Echogenicity  within normal limits. No mass or hydronephrosis visualized. Left Kidney: Renal measurements: 8.4 x 4.7 x 5.0 cm. = volume: 103 mL. Kidney is only partially visualized. No mass lesion or hydronephrosis is noted. Bladder: Bladder wall is thickened with internal septations. Other: None. IMPRESSION: Normal-appearing kidneys without hydronephrosis. Bladder wall thickening which may be related incomplete distension. A focal septation is noted within the bladder of uncertain significance. Direct visualization may be helpful as necessary. Electronically Signed   By: Inez Catalina M.D.   On: 01/25/2020 22:14   DG Chest Port 1 View  Result Date: 01/25/2020 CLINICAL DATA:  Pleural effusion. EXAM: PORTABLE CHEST 1 VIEW COMPARISON:  January 24, 2020. FINDINGS: Stable cardiomediastinal silhouette. Atherosclerosis of thoracic aorta is noted. Stable bilateral pleural effusions are noted, with associated bilateral pulmonary edema or atelectasis. Bony thorax is unremarkable. IMPRESSION: Stable bilateral pleural effusions are noted with associated bilateral pulmonary edema or atelectasis. Aortic Atherosclerosis (ICD10-I70.0). Electronically Signed   By: Marijo Conception M.D.   On: 01/25/2020 09:18   ECHOCARDIOGRAM COMPLETE  Result Date: 01/24/2020    ECHOCARDIOGRAM REPORT   Patient Name:   Gina [name] Date of Exam: 01/24/2020 Medical Rec #:  [id number]        Height:       67.0 in Accession #:    [id number]       Weight:       116.0 lb Date of Birth:  86 years old  BSA:          1.604 m Patient Age:    86 years         BP:           128/51 mmHg Patient Gender: F                HR:           62 bpm. Exam Location:  Inpatient Procedure: 2D Echo, Cardiac Doppler and Color Doppler Indications:    I50.33 Acute on chronic diastolic (congestive) heart failure  History:        Patient has prior history of Echocardiogram examinations, most                 recent 09/16/2019. Risk Factors:Hypertension, Diabetes and                  Dyslipidemia. Thyroid Disease.  Sonographer:    Tiffany Dance Referring Phys: [id number] [place]  1. Left ventricular ejection fraction, by estimation, is 55%. The left ventricle has normal function. The left ventricle has no regional wall motion abnormalities. There is mild left ventricular hypertrophy. Left ventricular diastolic parameters are consistent with Grade III diastolic dysfunction (restrictive). Elevated left ventricular end-diastolic pressure.  2. Right ventricular systolic function is normal. The right ventricular size is moderately enlarged. There is mildly elevated pulmonary artery systolic pressure.  3. Left atrial size was severely dilated.  4. Right atrial size was moderately dilated.  5. Large pleural effusion.  6. The mitral valve is normal in structure. Mild mitral valve regurgitation. No evidence of mitral stenosis.  7. Tricuspid valve regurgitation is mild to moderate.  8. The aortic valve is tricuspid. Aortic valve regurgitation is not visualized. No aortic stenosis is present.  9. The inferior vena cava is dilated in size with <50% respiratory variability, suggesting right atrial pressure of 15 mmHg. Comparison(s): A prior study was performed on 09/16/2019. Prior images reviewed side by side. RV/RA size has increased, TR has increased, RA pressure has increased. Similar appearance of large pleural effusion. FINDINGS  Left Ventricle: Left ventricular ejection fraction, by estimation, is 55%. The left ventricle has normal function. The left ventricle has no regional wall motion abnormalities. The left ventricular internal cavity size was normal in size. There is mild left ventricular hypertrophy. Left ventricular diastolic parameters are consistent with Grade III diastolic dysfunction (restrictive). Elevated left ventricular end-diastolic pressure. Right Ventricle: The right ventricular size is moderately enlarged. No increase in right ventricular wall thickness. Right ventricular  systolic function is normal. There is mildly elevated pulmonary artery systolic pressure. The tricuspid regurgitant velocity is 2.62 m/s, and with an assumed right atrial pressure of 15 mmHg, the estimated right ventricular systolic pressure is 89.3 mmHg. Left Atrium: Left atrial size was severely dilated. Right Atrium: Right atrial size was moderately dilated. Pericardium: Trivial pericardial effusion is present. Mitral Valve: The mitral valve is normal in structure. Normal mobility of the mitral valve leaflets. Mild mitral valve regurgitation. No evidence of mitral valve stenosis. Tricuspid Valve: The tricuspid valve is normal in structure. Tricuspid valve regurgitation is mild to moderate. No evidence of tricuspid stenosis. Aortic Valve: The aortic valve is tricuspid. Aortic valve regurgitation is not visualized. No aortic stenosis is present. Pulmonic Valve: The pulmonic valve was normal in structure. Pulmonic valve regurgitation is trivial. No evidence of pulmonic stenosis. Aorta: The aortic root is normal in size and structure. Venous: The inferior vena cava is dilated in size with  less than 50% respiratory variability, suggesting right atrial pressure of 15 mmHg. IAS/Shunts: No atrial level shunt detected by color flow Doppler. Additional Comments: There is a large pleural effusion.  LEFT VENTRICLE PLAX 2D LVIDd:         4.90 cm  Diastology LVIDs:         3.35 cm  LV e' lateral:   5.87 cm/s LV PW:         0.88 cm  LV E/e' lateral: 14.1 LV IVS:        0.75 cm  LV e' medial:    2.98 cm/s LVOT diam:     2.00 cm  LV E/e' medial:  27.8 LV SV:         58 LV SV Index:   36 LVOT Area:     3.14 cm  RIGHT VENTRICLE            IVC RV Basal diam:  2.70 cm    IVC diam: 2.69 cm RV S prime:     9.20 cm/s TAPSE (M-mode): 1.3 cm LEFT ATRIUM             Index       RIGHT ATRIUM           Index LA diam:        4.00 cm 2.49 cm/m  RA Area:     18.00 cm LA Vol (A2C):   96.8 ml 60.34 ml/m RA Volume:   51.50 ml  32.10 ml/m LA  Vol (A4C):   56.2 ml 35.03 ml/m LA Biplane Vol: 74.1 ml 46.19 ml/m  AORTIC VALVE LVOT Vmax:   73.45 cm/s LVOT Vmean:  48.000 cm/s LVOT VTI:    0.186 m  AORTA Ao Root diam: 3.30 cm Ao Asc diam:  2.70 cm MITRAL VALVE               TRICUSPID VALVE MV Area (PHT): 4.80 cm    TR Peak grad:   27.5 mmHg MV Decel Time: 158 msec    TR Vmax:        262.00 cm/s MV E velocity: 82.90 cm/s MV A velocity: 42.20 cm/s  SHUNTS MV E/A ratio:  1.96        Systemic VTI:  0.19 m                            Systemic Diam: 2.00 cm Cherlynn Kaiser MD Electronically signed by Cherlynn Kaiser MD Signature Date/Time: 01/24/2020/5:36:14 PM    Final     Medications:  Scheduled: . hydrALAZINE  25 mg Oral Q8H  . insulin aspart  0-9 Units Subcutaneous TID WC  . latanoprost  1 drop Both Eyes QHS  . levothyroxine  112 mcg Oral Daily  . Netarsudil Dimesylate  1 drop Both Eyes QHS  . [START ON 01/27/2020] pantoprazole  40 mg Intravenous Q12H   Continuous: . pantoprozole (PROTONIX) infusion 8 mg/hr (01/26/20 0400)    Assessment/Plan: 1) Acute HAV. 2) Fluctuating AMS. 3) ARF/CKD.   The patient's liver enzymes continue to decline.  Her INR also declined down to 1.4.  From the hepatic standpoint she is stable and continues to improve.  The current expectation is that she will resolve the acute HAV infection.  As for her AMS, she does have a change in her mental status this AM.  It is mild and she is easily corrected with reminders.  With the improvement in her liver enzymes  and synthetic function, her AMS is not felt to be as a result of her HAV infection.  Plan: 1) Continue with supportive care.  No specific interventions for her acute HAV.  LOS: 2 days   Zariya Minner D 01/26/2020, 8:14 AM

## 2020-01-27 ENCOUNTER — Encounter (HOSPITAL_COMMUNITY): Payer: Self-pay | Admitting: Family Medicine

## 2020-01-27 ENCOUNTER — Encounter (HOSPITAL_COMMUNITY)
Admission: EM | Disposition: A | Discharge status: Skilled Nursing Facility | Payer: Self-pay | Source: Home / Self Care | Attending: Internal Medicine

## 2020-01-27 ENCOUNTER — Inpatient Hospital Stay (HOSPITAL_COMMUNITY): Payer: Medicare Other | Admitting: Certified Registered Nurse Anesthetist

## 2020-01-27 HISTORY — PX: HEMOSTASIS CONTROL: SHX6838

## 2020-01-27 HISTORY — PX: ESOPHAGOGASTRODUODENOSCOPY: SHX5428

## 2020-01-27 LAB — COMPREHENSIVE METABOLIC PANEL
ALT: 355 U/L — ABNORMAL HIGH (ref 0–44)
AST: 150 U/L — ABNORMAL HIGH (ref 15–41)
Albumin: 2.5 g/dL — ABNORMAL LOW (ref 3.5–5.0)
Alkaline Phosphatase: 124 U/L (ref 38–126)
Anion gap: 8 (ref 5–15)
BUN: 82 mg/dL — ABNORMAL HIGH (ref 8–23)
CO2: 26 mmol/L (ref 22–32)
Calcium: 8.3 mg/dL — ABNORMAL LOW (ref 8.9–10.3)
Chloride: 105 mmol/L (ref 98–111)
Creatinine, Ser: 2.23 mg/dL — ABNORMAL HIGH (ref 0.44–1.00)
GFR calc Af Amer: 22 mL/min — ABNORMAL LOW (ref >60)
GFR calc non Af Amer: 19 mL/min — ABNORMAL LOW (ref >60)
Glucose, Bld: 97 mg/dL (ref 70–99)
Potassium: 3.9 mmol/L (ref 3.5–5.1)
Sodium: 139 mmol/L (ref 135–145)
Total Bilirubin: 1.1 mg/dL (ref 0.3–1.2)
Total Protein: 5.9 g/dL — ABNORMAL LOW (ref 6.5–8.1)

## 2020-01-27 LAB — CBC WITH DIFFERENTIAL/PLATELET
Abs Immature Granulocytes: 0.02 10*3/uL (ref 0.00–0.07)
Basophils Absolute: 0 10*3/uL (ref 0.0–0.1)
Basophils Relative: 0 %
Eosinophils Absolute: 0.1 10*3/uL (ref 0.0–0.5)
Eosinophils Relative: 2 %
HCT: 32.4 % — ABNORMAL LOW (ref 36.0–46.0)
Hemoglobin: 10.3 g/dL — ABNORMAL LOW (ref 12.0–15.0)
Immature Granulocytes: 0 %
Lymphocytes Relative: 11 %
Lymphs Abs: 0.7 10*3/uL (ref 0.7–4.0)
MCH: 28.2 pg (ref 26.0–34.0)
MCHC: 31.8 g/dL (ref 30.0–36.0)
MCV: 88.8 fL (ref 80.0–100.0)
Monocytes Absolute: 0.6 10*3/uL (ref 0.1–1.0)
Monocytes Relative: 9 %
Neutro Abs: 5.3 10*3/uL (ref 1.7–7.7)
Neutrophils Relative %: 78 %
Platelets: 143 10*3/uL — ABNORMAL LOW (ref 150–400)
RBC: 3.65 MIL/uL — ABNORMAL LOW (ref 3.87–5.11)
RDW: 16.2 % — ABNORMAL HIGH (ref 11.5–15.5)
WBC: 6.8 10*3/uL (ref 4.0–10.5)
nRBC: 0 % (ref 0.0–0.2)

## 2020-01-27 LAB — GLUCOSE, CAPILLARY
Glucose-Capillary: 107 mg/dL — ABNORMAL HIGH (ref 70–99)
Glucose-Capillary: 111 mg/dL — ABNORMAL HIGH (ref 70–99)
Glucose-Capillary: 161 mg/dL — ABNORMAL HIGH (ref 70–99)
Glucose-Capillary: 143 mg/dL — ABNORMAL HIGH (ref 70–99)

## 2020-01-27 LAB — PROTIME-INR
INR: 1.3 — ABNORMAL HIGH (ref 0.8–1.2)
Prothrombin Time: 15.8 seconds — ABNORMAL HIGH (ref 11.4–15.2)

## 2020-01-27 LAB — MAGNESIUM: Magnesium: 2 mg/dL (ref 1.7–2.4)

## 2020-01-27 SURGERY — EGD (ESOPHAGOGASTRODUODENOSCOPY)
Anesthesia: Monitor Anesthesia Care

## 2020-01-27 MED ORDER — SODIUM CHLORIDE 0.9 % IV SOLN: INTRAVENOUS | Status: DC

## 2020-01-27 MED ORDER — IPRATROPIUM-ALBUTEROL 0.5-2.5 (3) MG/3ML IN SOLN
3.0000 mL | Freq: Once | RESPIRATORY_TRACT | Status: AC
  Administered 2020-01-27: 3 mL via RESPIRATORY_TRACT
  Filled 2020-01-27: qty 3

## 2020-01-27 MED ORDER — LIDOCAINE HCL (CARDIAC) PF 100 MG/5ML IV SOSY
PREFILLED_SYRINGE | INTRAVENOUS | Status: DC | PRN
  Administered 2020-01-27: 60 mg via INTRAVENOUS

## 2020-01-27 MED ORDER — HYDRALAZINE HCL 50 MG PO TABS
50.0000 mg | ORAL_TABLET | Freq: Three times a day (TID) | ORAL | Status: DC
  Administered 2020-01-27 – 2020-01-31 (×11): 50 mg via ORAL
  Filled 2020-01-27 (×9): qty 1

## 2020-01-27 MED ORDER — PROPOFOL 500 MG/50ML IV EMUL
INTRAVENOUS | Status: DC | PRN
  Administered 2020-01-27: 75 ug/kg/min via INTRAVENOUS

## 2020-01-27 MED ORDER — PROPOFOL 10 MG/ML IV BOLUS
INTRAVENOUS | Status: DC | PRN
  Administered 2020-01-27: 20 mg via INTRAVENOUS

## 2020-01-27 MED ORDER — FUROSEMIDE 10 MG/ML IJ SOLN
80.0000 mg | Freq: Two times a day (BID) | INTRAMUSCULAR | Status: DC
  Administered 2020-01-27 – 2020-01-29 (×5): 80 mg via INTRAVENOUS
  Filled 2020-01-27 (×5): qty 8

## 2020-01-27 NOTE — Transfer of Care (Signed)
Immediate Anesthesia Transfer of Care Note  Patient: Gina [name]  Procedure(s) Performed: ESOPHAGOGASTRODUODENOSCOPY (EGD) (N/A ) HEMOSTASIS CONTROL  Patient Location: PACU and Endoscopy Unit  Anesthesia Type:MAC  Level of Consciousness: drowsy  Airway & Oxygen Therapy: Patient Spontanous Breathing and Patient connected to nasal cannula oxygen  Post-op Assessment: Report given to RN, Post -op Vital signs reviewed and stable and Patient moving all extremities  Post vital signs: Reviewed and stable  Last Vitals:  Vitals Value Taken Time  BP 125/52 01/27/20 1452  Temp 36.6 C 01/27/20 1452  Pulse 61 01/27/20 1453  Resp 18 01/27/20 1453  SpO2 90 % 01/27/20 1453  Vitals shown include unvalidated device data.  Last Pain:  Vitals:   01/27/20 1452  TempSrc: Axillary  PainSc:       Patients Stated Pain Goal: 4 (71/24/58 0998)  Complications: No apparent anesthesia complications

## 2020-01-27 NOTE — Op Note (Signed)
[facility] Patient Name: Gina [name] Procedure Date : 01/27/2020 MRN: [id number] Attending MD: Carol Ada , MD Date of Birth: 1929/06/18 CSN: [id number] Age: 86 Admit Type: Inpatient Procedure:                Upper GI endoscopy Indications:              Iron deficiency anemia Providers:                Carol Ada, MD, Benetta Spar RN, RN, Laverda Sorenson, Technician, Raphael Gibney, CRNA Referring MD:              Medicines:                Propofol per Anesthesia Complications:            No immediate complications. Estimated Blood Loss:     Estimated blood loss: none. Procedure:                Pre-Anesthesia Assessment:                           - Prior to the procedure, a History and Physical                            was performed, and patient medications and                            allergies were reviewed. The patient's tolerance of                            previous anesthesia was also reviewed. The risks                            and benefits of the procedure and the sedation                            options and risks were discussed with the patient.                            All questions were answered, and informed consent                            was obtained. Prior Anticoagulants: The patient has                            taken no previous anticoagulant or antiplatelet                            agents. ASA Grade Assessment: III - A patient with                            severe systemic disease. After reviewing the risks  and benefits, the patient was deemed in                            satisfactory condition to undergo the procedure.                           - Sedation was administered by an anesthesia                            professional. Deep sedation was attained.                           After obtaining informed consent, the endoscope was                            passed under  direct vision. Throughout the                            procedure, the patient's blood pressure, pulse, and                            oxygen saturations were monitored continuously. The                            GIF-H190 ([id number] Olympus gastroscope was                            introduced through the mouth, and advanced to the                            second part of duodenum. The upper GI endoscopy was                            accomplished without difficulty. The patient                            tolerated the procedure well. Scope In: Scope Out: Findings:      The esophagus was normal.      Multiple small angiodysplastic lesions with no bleeding were found in       the gastric antrum. Coagulation for tissue destruction using monopolar       probe was successful. Estimated blood loss: none.      The examined duodenum was normal.      This examination showed a marked improvement compared to the most recent       enteroscopy. At that time there was active bleeding in the gastric       lumen. The prior lesions ablated in the body of the stomach was replaced       with normal mucosa. 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 This area was ablated with APC. Impression:               - Normal esophagus.                           -  Multiple non-bleeding angiodysplastic lesions in                            the stomach. Treated with a monopolar probe.                           - Normal examined duodenum.                           - No specimens collected. Recommendation:           - Return patient to hospital ward for ongoing care.                           - Resume regular diet.                           - Continue present medications. Procedure Code(s):        --- Professional ---                           [phone], Esophagogastroduodenoscopy, flexible,                            transoral; with ablation of tumor(s), polyp(s), or                             other lesion(s) (includes pre- and post-dilation                            and guide wire passage, when performed) Diagnosis Code(s):        --- Professional ---                           K31.819, Angiodysplasia of stomach and duodenum                            without bleeding                           D50.9, Iron deficiency anemia, unspecified CPT copyright 2019 American Medical Association. All rights reserved. The codes documented in this report are preliminary and upon coder review may  be revised to meet current compliance requirements. Carol Ada, MD Carol Ada, MD 01/27/2020 2:46:42 PM This report has been signed electronically. Number of Addenda: 0

## 2020-01-27 NOTE — Interval H&P Note (Signed)
History and Physical Interval Note:  01/27/2020 2:14 PM  Gina [name]  has presented today for surgery, with the diagnosis of GI bleed.  The various methods of treatment have been discussed with the patient and family. After consideration of risks, benefits and other options for treatment, the patient has consented to  Procedure(s): ESOPHAGOGASTRODUODENOSCOPY (EGD) (N/A) as a surgical intervention.  The patient's history has been reviewed, patient examined, no change in status, stable for surgery.  I have reviewed the patient's chart and labs.  Questions were answered to the patient's satisfaction.     Birgit Nowling D

## 2020-01-27 NOTE — Anesthesia Postprocedure Evaluation (Signed)
Anesthesia Post Note  Patient: Gina [name]  Procedure(s) Performed: ESOPHAGOGASTRODUODENOSCOPY (EGD) (N/A ) HEMOSTASIS CONTROL     Patient location during evaluation: Endoscopy Anesthesia Type: MAC Level of consciousness: awake Pain management: pain level controlled Vital Signs Assessment: post-procedure vital signs reviewed and stable Respiratory status: spontaneous breathing, nonlabored ventilation, respiratory function stable and patient connected to nasal cannula oxygen Cardiovascular status: stable and blood pressure returned to baseline Postop Assessment: no apparent nausea or vomiting Anesthetic complications: no    Last Vitals:  Vitals:   01/27/20 1512 01/27/20 1614  BP: (!) 157/51 (!) 159/53  Pulse: 62 65  Resp: 16   Temp:  36.5 C  SpO2: 94% 100%    Last Pain:  Vitals:   01/27/20 1614  TempSrc: Oral  PainSc:                  Gina [name]

## 2020-01-27 NOTE — TOC Initial Note (Addendum)
Transition of Care [facility]) - Initial/Assessment Note    Patient Details  Name: Gina [name] MRN: [id number] Date of Birth: 10-Aug-1929  [facility] CM/SW Contact:    Benard Halsted, LCSW Phone Number: 01/27/2020, 9:56 AM  Clinical Narrative:                 9:56am-CSW received consult for possible SNF placement at time of discharge. CSW spoke with patient's niece, [name], regarding PT recommendation of SNF placement at time of discharge. [name] reports patient's son is her Copy, though [name] has been assisting the patient. She reported that patient was recently discharged on 4/15 from [facility] for rehab. She expressed understanding of PT recommendation and is agreeable to SNF placement at time of discharge. Patient reported that she did not want to go back to [facility] and prefers [place]. [name] is going to confirm that with patient's son as patient is currently disoriented. CSW discussed insurance authorization process and potential barrier that Medicare may not approve another rehab stay so soon after patient has recently discharged from SNF. CSW explained that patient does have Medicaid which may pay for long term care if Medicare denies patient. She will contact CSW back once she confers with patient's son about facility choice. No further questions reported at this time. CSW to continue to follow and assist with discharge planning needs.  11:35am-CSW received call back from [place]. She conferred with patient's son who expressed request for [facility]. CSW will reach out to [facility] for review.    Expected Discharge Plan: Skilled Nursing Facility Barriers to Discharge: Continued Medical Work up, Ship broker   Patient Goals and CMS Choice Patient states their goals for this hospitalization and ongoing recovery are:: Rehab CMS Medicare.gov Compare Post Acute Care list provided to:: Patient Represenative (must comment)(Niece,  [name]) Choice offered to / list presented to : (Niece, Patent examiner)  Expected Discharge Plan and Services Expected Discharge Plan: [place] In-house Referral: Clinical Social Work   Post Acute Care Choice: [place] Living arrangements for the past 2 months: [place], Apartment                                      Prior Living Arrangements/Services Living arrangements for the past 2 months: [place], Apartment Lives with:: Self Patient language and need for interpreter reviewed:: Yes Do you feel safe going back to the place where you live?: Yes      Need for Family Participation in Patient Care: Yes (Comment) Care giver support system in place?: Yes (comment)   Criminal Activity/Legal Involvement Pertinent to Current Situation/Hospitalization: No - Comment as needed  Activities of Daily Living   ADL Screening (condition at time of admission) Patient's cognitive ability adequate to safely complete daily activities?: Yes Is the patient deaf or have difficulty hearing?: No Does the patient have difficulty seeing, even when wearing glasses/contacts?: No Does the patient have difficulty concentrating, remembering, or making decisions?: Yes Patient able to express need for assistance with ADLs?: Yes Does the patient have difficulty dressing or bathing?: Yes Independently performs ADLs?: No Communication: Independent Dressing (OT): Needs assistance Is this a change from baseline?: Pre-admission baseline Grooming: Needs assistance Is this a change from baseline?: Pre-admission baseline Feeding: Independent Bathing: Needs assistance Is this a change from baseline?: Pre-admission baseline Does the patient have difficulty walking or climbing stairs?: Yes Weakness  of Legs: Both Weakness of Arms/Hands: None  Permission Sought/Granted Permission sought to share information with : Facility Sport and exercise psychologist,  Family Supports Permission granted to share information with : No  Share Information with NAME: [name]  Permission granted to share info w AGENCY: SNFs  Permission granted to share info w Relationship: Niece (and son who is Media planner)  Permission granted to share info w Contact Information: [phone]  Emotional Assessment Appearance:: Appears stated age Attitude/Demeanor/Rapport: Unable to Assess Affect (typically observed): Unable to Assess Orientation: : (Disoriented) Alcohol / Substance Use: Not Applicable Psych Involvement: No (comment)  Admission diagnosis:  Acute GI bleeding [K92.2] Transaminitis [R74.01] Fall [W19.XXXA] Gastrointestinal hemorrhage, unspecified gastrointestinal hemorrhage type [K92.2] Patient Active Problem List   Diagnosis Date Noted  . Acute GI bleeding 01/24/2020  . Acute metabolic encephalopathy 26/83/4196  . Acute on chronic diastolic CHF (congestive heart failure) ([place]) 01/24/2020  . Acute on chronic respiratory failure with hypoxia ([place]) 01/24/2020  . Transaminitis 01/24/2020  . Acute kidney injury superimposed on chronic kidney disease ([place]) 01/24/2020  . Symptomatic anemia 12/23/2019  . Acute on chronic heart failure with preserved ejection fraction (HFpEF) ([place]) 09/13/2019  . Atrial fibrillation ([place]) 09/13/2019  . Pleural effusion, bilateral 09/12/2019  . Acute respiratory failure with hypoxia ([place]) 09/12/2019  . Heart failure ([place]) 04/11/2019  . TIA (transient ischemic attack) 03/26/2019  . PAD (peripheral artery disease) ([place]) 02/03/2019  . Elevated troponin 01/15/2019  . ARF (acute renal failure) ([place]) 01/14/2019  . Normochromic normocytic anemia 01/14/2019  . Hypertensive heart disease with heart failure ([place]) 10/12/2018  . CKD (chronic kidney disease) stage 3, GFR 30-59 ml/min 10/12/2018  . Pseudophakia of both eyes 07/10/2017  . Status post thoracentesis 05/28/2017  . Primary open-angle glaucoma 05/26/2017  . History of AVM  (arteriovenous malformation) of duodenum, acquired with hemorrhage 10/24/2016  . Iron deficiency anemia 10/24/2016  . History of duodenal ulcer 10/24/2016  . Fatigue 01/22/2016  . Chronic diastolic congestive heart failure ([place]) 01/22/2016  . Postablative hypothyroidism 07/19/2014  . His of mesenteric ischemia 01/25/2014  . History of gastric ulcer 05/12/2013  . Protein-calorie malnutrition, severe ([place]) 05/07/2013  . Syncope 05/06/2013  . Fall 03/04/2013  . Thyroid nodule 03/04/2013  . Loss of weight 01/28/2013  . Constipation 09/10/2012  . DDD (degenerative disc disease), cervical 07/22/2012  . Hip pain, bilateral 07/26/2011  . Preventative health care 07/26/2011  . PVD (peripheral vascular disease) ([place])   . Diabetes mellitus, type 2 ([place])   . TB lung, latent   . HLD (hyperlipidemia) 05/23/2008  . PULMONARY NODULE 12/29/2007  . Essential hypertension 08/04/2006   PCP:  [name], NP Pharmacy:   [place], [place] - [address] [place] [address] Phone: [phone] Fax: [place], [place] [address] Phone: [phone] Fax: [phone]     Social Determinants of Health (SDOH) Interventions    Readmission Risk Interventions Readmission Risk Prevention Plan 01/27/2020 04/20/2019 04/15/2019  Transportation Screening Complete Complete Complete  PCP or Specialist Appt within 3-5 Days - Complete -  HRI or [place] - Complete -  Social Work Consult for [place] Planning/Counseling - Complete -  Palliative Care Screening - Not Applicable -  Medication Review (RN Care Manager) Complete Complete -  PCP or Specialist appointment within 3-5 days of discharge Complete - -  [place] or Home Care Consult Complete -  Complete  SW Recovery Care/Counseling Consult Complete - -  Palliative Care Screening Not Applicable - Not  Applicable  Skilled Nursing Facility Complete - Not Applicable  Some recent data might be hidden

## 2020-01-27 NOTE — NC FL2 (Signed)
[place] LEVEL OF CARE SCREENING TOOL     IDENTIFICATION  Patient Name: Gina [name] Birthdate: 1929-08-16 Sex: female Admission Date (Current Location): 01/23/2020  [facility] and [place] Number:  Herbalist and Address:  [facility], [place] [address], [place], [place] [address]      Provider Number: [id number]  Attending Physician Name and Address:  Florencia Reasons, MD  Relative Name and Phone Number:  [name] niece [phone]   / Son, Konrad Dolores    Current Level of Care: Hospital Recommended Level of Care: [place] Prior Approval Number:    Date Approved/Denied:   PASRR Number: [id number]  Discharge Plan: SNF    Current Diagnoses: Patient Active Problem List   Diagnosis Date Noted  . Acute GI bleeding 01/24/2020  . Acute metabolic encephalopathy 17/49/4496  . Acute on chronic diastolic CHF (congestive heart failure) ([place]) 01/24/2020  . Acute on chronic respiratory failure with hypoxia ([employer]) 01/24/2020  . Transaminitis 01/24/2020  . Acute kidney injury superimposed on chronic kidney disease ([place]) 01/24/2020  . Symptomatic anemia 12/23/2019  . Acute on chronic heart failure with preserved ejection fraction (HFpEF) ([place]) 09/13/2019  . Atrial fibrillation ([place]) 09/13/2019  . Pleural effusion, bilateral 09/12/2019  . Acute respiratory failure with hypoxia ([place]) 09/12/2019  . Heart failure ([place]) 04/11/2019  . TIA (transient ischemic attack) 03/26/2019  . PAD (peripheral artery disease) ([place]) 02/03/2019  . Elevated troponin 01/15/2019  . ARF (acute renal failure) ([place]) 01/14/2019  . Normochromic normocytic anemia 01/14/2019  . Hypertensive heart disease with heart failure ([place]) 10/12/2018  . CKD (chronic kidney disease) stage 3, GFR 30-59 ml/min 10/12/2018  . Pseudophakia of both eyes 07/10/2017  . Status post thoracentesis 05/28/2017  . Primary open-angle glaucoma 05/26/2017  . History of AVM (arteriovenous  malformation) of duodenum, acquired with hemorrhage 10/24/2016  . Iron deficiency anemia 10/24/2016  . History of duodenal ulcer 10/24/2016  . Fatigue 01/22/2016  . Chronic diastolic congestive heart failure ([place]) 01/22/2016  . Postablative hypothyroidism 07/19/2014  . His of mesenteric ischemia 01/25/2014  . History of gastric ulcer 05/12/2013  . Protein-calorie malnutrition, severe ([place]) 05/07/2013  . Syncope 05/06/2013  . Fall 03/04/2013  . Thyroid nodule 03/04/2013  . Loss of weight 01/28/2013  . Constipation 09/10/2012  . DDD (degenerative disc disease), cervical 07/22/2012  . Hip pain, bilateral 07/26/2011  . Preventative health care 07/26/2011  . PVD (peripheral vascular disease) ([place])   . Diabetes mellitus, type 2 ([place])   . TB [name], latent   . HLD (hyperlipidemia) 05/23/2008  . PULMONARY NODULE 12/29/2007  . Essential hypertension 08/04/2006    Orientation RESPIRATION BLADDER Height & Weight     (Disoriented x4)  O2(4L Nasal Cannula) Incontinent, External catheter Weight: 132 lb 11.5 oz (60.2 kg) Height:  5\' 7"  (170.2 cm)  BEHAVIORAL SYMPTOMS/MOOD NEUROLOGICAL BOWEL NUTRITION STATUS      Continent Diet(Please see DC Summary)  AMBULATORY STATUS COMMUNICATION OF NEEDS Skin   Extensive Assist Verbally Other (Comment)(Non-pressure wound on knee)                       Personal Care Assistance Level of Assistance  Bathing, Feeding, Dressing Bathing Assistance: Maximum assistance Feeding assistance: Maximum assistance Dressing Assistance: Maximum assistance     Functional Limitations Info             SPECIAL CARE FACTORS FREQUENCY  PT (By licensed PT), OT (By licensed OT)  PT Frequency: 5x/week OT Frequency: 5x/week            Contractures Contractures Info: Not present    Additional Factors Info  Code Status, Allergies, Insulin Sliding Scale Code Status Info: Full Allergies Info: NKA   Insulin Sliding Scale Info: Insulin       Current  Medications (01/27/2020):  This is the current hospital active medication list Current Facility-Administered Medications  Medication Dose Route Frequency Provider Last Rate Last Admin  . albuterol (VENTOLIN HFA) 108 (90 Base) MCG/ACT inhaler 2 puff  2 puff Inhalation Q6H PRN Cherene Altes, MD      . guaiFENesin (MUCINEX) 12 hr tablet 600 mg  600 mg Oral BID Florencia Reasons, MD   600 mg at 01/26/20 2016  . haloperidol (HALDOL) tablet 1 mg  1 mg Oral Q6H PRN Lang Snow, FNP       Or  . haloperidol lactate (HALDOL) injection 5 mg  5 mg Intramuscular Q6H PRN Lang Snow, FNP      . hydrALAZINE (APRESOLINE) tablet 25 mg  25 mg Oral Q8H Florencia Reasons, MD   25 mg at 01/26/20 2017  . insulin aspart (novoLOG) injection 0-9 Units  0-9 Units Subcutaneous TID WC Tu, Ching T, DO   2 Units at 01/26/20 1801  . latanoprost (XALATAN) 0.005 % ophthalmic solution 1 drop  1 drop Both Eyes QHS Cherene Altes, MD   1 drop at 01/26/20 2017  . levothyroxine (SYNTHROID) tablet 112 mcg  112 mcg Oral Daily Cherene Altes, MD   112 mcg at 01/26/20 0603  . Netarsudil Dimesylate 0.02 % SOLN 1 drop  1 drop Both Eyes QHS Cherene Altes, MD   1 drop at 01/26/20 2017  . pantoprazole (PROTONIX) injection 40 mg  40 mg Intravenous Q12H Cherene Altes, MD         Discharge Medications: Please see discharge summary for a list of discharge medications.  Relevant Imaging Results:  Relevant Lab Results:   Additional Information SSN: [id number]           COVID negative on 4/27  Lissa Morales Zionah Criswell, LCSW

## 2020-01-27 NOTE — Progress Notes (Signed)
PROGRESS NOTE  Gina [name]:[id number] DOB: [birth year] DOA: 01/23/2020 PCP: [name], NP  Brief Narrative:  86 year old with a history of gastric ulcer/AV malformations, PVD status post bilateral femoropopliteal bypasses, chronic hypoxic respiratory failure on 2 L, HTN, HLD, DM, CKD stage IV, mesenteric ischemia, Graves' disease, and diastolic congestive heart failure who presented to the ED complaining of weakness.  The patient was very lethargic and unable to provide a reliable history.  She did report significant lower extremity swelling right greater than left for at least 48 hours as well as dyspnea on exertion.  Reportedly she lives alone and EMS had to break into her home to care for her.  She was found to be saturating 80% on room air with a fever of 102.2.  The patient was discharged 3/30 after an admit for symptomatic anemia with a hemoglobin of 7 > 10.  She was transfused 1 unit of PRBCs and underwent an EGD which noted multiple large angiodysplastic lesions with bleeding in the gastric fundus and body that was treated.  She was also confirmed to have angiodysplastic lesions in the duodenum and jejunum with the plan being to repeat an EGD in 2 weeks.   HPI/Recap of past 24 hours:  She is seen after return from EGD,   groggy from the procedure She denies pain   Fever appear has resolved, no nausea no vomiting reported No diarrhea    Assessment/Plan: Principal Problem:   Acute GI bleeding Active Problems:   HLD (hyperlipidemia)   Essential hypertension   Diabetes mellitus, type 2 (HCC)   Acute metabolic encephalopathy   Acute on chronic diastolic CHF (congestive heart failure) (HCC)   Acute on chronic respiratory failure with hypoxia (HCC)   Transaminitis   Acute kidney injury superimposed on chronic kidney disease (HCC)   Recurrent acute on chronic symptomatic anemia, history of GI AVMs -Hemoglobin 7.6 on presentation, FOBT + -s/p 1 unit of PRBCs  on presentation  -Hemoglobin has been stable since transfusion, Hgb 10.0  -9.7 - EGD "Multiple non-bleeding angiodysplastic lesions in the stomach. 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 This area was ablated with APC." -Continue PPI once a day -GI Dr. Benson Norway input appreciated, will follow recommendation   Acute hepatitis A with acute liver failure -Presented with fever, acute elevated LFT -Hepatitis A IgM positive -Improving -GI consulted  Acute metabolic encephalopathy -Present with lethargy, improving, alert, though not oriented to time, not sure if this is baseline  Acute on chronic hypoxic respiratory failure , baseline on 2 L, -Required 4-5 L in the hospital -Acute on chronic diastolic CHF with bilateral pleural effusion --Echocardiogram left ventricular EF 55%, Left ventricular diastolic parameters are  consistent with Grade III diastolic dysfunction (restrictive).  Trivial pericardial effusion  -She started to have wheezing on exam , repeat chest x-ray showed"Moderate to large layering bilateral pleural effusions with underlying consolidation."start Lasix drip in the setting of renal impairment, change to IV Lasix 80 mg twice a day today --Close monitor volume status, I's and O's  Wheezing on examination, likely cardiogenic wheezing, but patient is a longtime smoker report quit 2 years ago, not able to rule out  COPD , started on nebulizer, continue Mucinex, if wheezing does not improve with Lasix, will consider steroid   AKI on CKD4 -Urine culture with multiple species none dominant -renal ultrasound "Normal-appearing kidneys without hydronephrosis.  Bladder wall thickening which may be related incomplete  distension. A focal septation is noted within the bladder of uncertain significance. Direct visualization may be helpful as necessary. -She denies urinary symptom, fever subsided, will monitor off antibiotics -AKI  might have component of cardiorenal syndrome as well ,creatinine improved on IV Lasix -Renal dosing meds  Hypokalemia, replaced K, monitor   HTN: Home medication Norvasc hydralazine held since admission BP around 150 ,restart  Hydralazine, on IV Lasix    Diet controlled diabetes A.m. blood glucose 100 A1c 6 Continue diet control  PVD status post bilateral femoropopliteal bypasses Continue Lipitor Not on aspirin likely due to GI bleed  Right knee pain status post fall Was released from her SNF after a rehab stay on 4/15  Stage III right patellar ulcer  Body mass index is 20.79 kg/m.     FTT,  therapy eval, skilled nursing facility placement  DVT Prophylaxis: SCDs  Code Status: Full  Family Communication: niece annette at bedside with permission on April 28  Disposition Plan:    Patient came from:                     Home                                                                                      Anticipated d/c place:  TBD  Barriers to d/c OR conditions which need to be met to effect a safe d/c:  Acute liver failure, acute kidney failure, acute on chronic CHF, not ready to discharge  Consultants:  GI  Procedures:  PRBC transfusion  EGD on April 29  Antibiotics:  None   Objective: BP 118/66 (BP Location: Left Arm)   Pulse 67   Temp 97.8 F (36.6 C) (Axillary)   Resp 17   Ht _0  (1.702 m)   Wt 60.2 kg   SpO2 94%   BMI 20.79 kg/m   Intake/Output Summary (Last 24 hours) at 01/27/2020 1206 Last data filed at 01/27/2020 0900 Gross per 24 hour  Intake 240 ml  Output 700 ml  Net -460 ml   Filed Weights   01/24/20 1400 01/25/20 0406 01/27/20 0400  Weight: 54.4 kg 59 kg 60.2 kg    Exam: Patient is examined daily including today on 01/27/2020, exams remain the same as of yesterday except that has changed    General: Frail, drowsy  Cardiovascular: RRR  Respiratory:  mild wheezing bilateral  Abdomen: Soft/ND/NT, positive  BS  Musculoskeletal: No Edema  Neuro: Drowsy  Data Reviewed: Basic Metabolic Panel: Recent Labs  Lab 01/23/20 2300 01/24/20 1138 01/25/20 0710 01/26/20 0541 01/27/20 0341  NA 140 141 143 140 139  K 3.7 3.5 3.9 3.0* 3.9  CL 103 103 109 106 105  CO2 23 25 20* 24 26  GLUCOSE 164* 105* 100* 95 97  BUN 112* 109* 105* 92* 82*  CREATININE 2.98* 2.87* 2.75* 2.52* 2.23*  CALCIUM 8.5* 8.7* 8.3* 8.0* 8.3*  MG  --   --   --   --  2.0   Liver Function Tests: Recent Labs  Lab 01/23/20 2300 01/24/20 1138 01/25/20 0710 01/26/20 0541 01/27/20 0341  AST 1,310* 1,477* 1,080* 356*  150*  ALT 679* 850* 843* 546* 355*  ALKPHOS 182* 185* 163* 133* 124  BILITOT 1.3* 1.4* 1.3* 1.2 1.1  PROT 6.4* 6.9 6.1* 5.9* 5.9*  ALBUMIN 3.0* 3.2* 2.8* 2.5* 2.5*   No results for input(s): LIPASE, AMYLASE in the last 168 hours. Recent Labs  Lab 01/24/20 1127  AMMONIA 23   CBC: Recent Labs  Lab 01/23/20 2300 01/23/20 2300 01/24/20 1138 01/24/20 1551 01/25/20 0942 01/26/20 0541 01/27/20 0341  WBC 6.8   < > 6.4 7.0 6.4 6.2 6.8  NEUTROABS 6.0  --   --   --   --  5.0 5.3  HGB 7.6*   < > 9.9* 10.1* 10.3* 9.7* 10.3*  HCT 24.8*   < > 31.4* 31.5* 33.2* 30.6* 32.4*  MCV 95.4   < > 90.0 89.7 92.0 89.7 88.8  PLT 225   < > 180 163 150 119* 143*   < > = values in this interval not displayed.   Cardiac Enzymes:   Recent Labs  Lab 01/24/20 1127  CKTOTAL 443*   BNP (last 3 results) Recent Labs    10/26/19 1027 12/23/19 1624 01/23/20 2300  BNP 756.7* 724.7* 2,946.3*    ProBNP (last 3 results) No results for input(s): PROBNP in the last 8760 hours.  CBG: Recent Labs  Lab 01/26/20 0732 01/26/20 1121 01/26/20 1607 01/26/20 2107 01/27/20 0731  GLUCAP 108* 120* 200* 134* 107*    Recent Results (from the past 240 hour(s))  Respiratory Panel by RT PCR (Flu A&B, Covid) - Nasopharyngeal Swab     Status: None   Collection Time: 01/23/20 11:47 PM   Specimen: Nasopharyngeal Swab  Result  Value Ref Range Status   SARS Coronavirus 2 by RT PCR NEGATIVE NEGATIVE Final    Comment: (NOTE) SARS-CoV-2 target nucleic acids are NOT DETECTED. The SARS-CoV-2 RNA is generally detectable in upper respiratoy specimens during the acute phase of infection. The lowest concentration of SARS-CoV-2 viral copies this assay can detect is 131 copies/mL. A negative result does not preclude SARS-Cov-2 infection and should not be used as the sole basis for treatment or other patient management decisions. A negative result may occur with  improper specimen collection/handling, submission of specimen other than nasopharyngeal swab, presence of viral mutation(s) within the areas targeted by this assay, and inadequate number of viral copies (<131 copies/mL). A negative result must be combined with clinical observations, patient history, and epidemiological information. The expected result is Negative. Fact Sheet for Patients:  PinkCheek.be Fact Sheet for Healthcare Providers:  GravelBags.it This test is not yet ap proved or cleared by the [place] FDA and  has been authorized for detection and/or diagnosis of SARS-CoV-2 by FDA under an Emergency Use Authorization (EUA). This EUA will remain  in effect (meaning this test can be used) for the duration of the COVID-19 declaration under Section 564(b)(1) of the Act, 21 U.S.C. section 360bbb-3(b)(1), unless the authorization is terminated or revoked sooner.    Influenza A by PCR NEGATIVE NEGATIVE Final   Influenza B by PCR NEGATIVE NEGATIVE Final    Comment: (NOTE) The Xpert Xpress SARS-CoV-2/FLU/RSV assay is intended as an aid in  the diagnosis of influenza from Nasopharyngeal swab specimens and  should not be used as a sole basis for treatment. Nasal washings and  aspirates are unacceptable for Xpert Xpress SARS-CoV-2/FLU/RSV  testing. Fact Sheet for  Patients: PinkCheek.be Fact Sheet for Healthcare Providers: GravelBags.it This test is not yet approved or cleared by the [place]  FDA and  has been authorized for detection and/or diagnosis of SARS-CoV-2 by  FDA under an Emergency Use Authorization (EUA). This EUA will remain  in effect (meaning this test can be used) for the duration of the  Covid-19 declaration under Section 564(b)(1) of the Act, 21  U.S.C. section 360bbb-3(b)(1), unless the authorization is  terminated or revoked. Performed at [facility], [place] [address] [place], [place] [address]   Urine culture     Status: Abnormal   Collection Time: 01/24/20 11:55 AM   Specimen: Urine, Random  Result Value Ref Range Status   Specimen Description URINE, RANDOM  Final   Special Requests   Final    NONE Performed at [facility], [place] [address] [place], [place] [address]    Culture MULTIPLE SPECIES PRESENT, SUGGEST RECOLLECTION (A)  Final   Report Status 01/25/2020 FINAL  Final  MRSA PCR Screening     Status: None   Collection Time: 01/24/20  2:02 PM   Specimen: Nasopharyngeal  Result Value Ref Range Status   MRSA by PCR NEGATIVE NEGATIVE Final    Comment:        The GeneXpert MRSA Assay (FDA approved for NASAL specimens only), is one component of a comprehensive MRSA colonization surveillance program. It is not intended to diagnose MRSA infection nor to guide or monitor treatment for MRSA infections. Performed at [facility], [place] [address] [place], [place] [address]      Studies: DG CHEST PORT 1 VIEW  Result Date: 01/26/2020 CLINICAL DATA:  Patient with fever and confusion. EXAM: PORTABLE CHEST 1 VIEW COMPARISON:  Chest radiograph 01/25/2020. FINDINGS: Monitoring leads overlie the patient. Stable scattered cardiac and mediastinal contours. Aortic atherosclerosis. Moderate to large layering bilateral pleural effusions with  underlying consolidation. Similar bilateral interstitial opacities. No pneumothorax. IMPRESSION: Moderate to large layering bilateral pleural effusions with underlying consolidation. Electronically Signed   By: Lovey Newcomer M.D.   On: 01/26/2020 17:44    Scheduled Meds: . furosemide  80 mg Intravenous BID  . guaiFENesin  600 mg Oral BID  . hydrALAZINE  25 mg Oral Q8H  . insulin aspart  0-9 Units Subcutaneous TID WC  . latanoprost  1 drop Both Eyes QHS  . levothyroxine  112 mcg Oral Daily  . Netarsudil Dimesylate  1 drop Both Eyes QHS  . pantoprazole  40 mg Intravenous Q12H    Continuous Infusions:    Time spent: 69mns,  I have personally reviewed and interpreted on  01/27/2020 daily labs, tele strips, imagings as discussed above under date review session and assessment and plans.  I reviewed all nursing notes, pharmacy notes, consultant notes,  vitals, pertinent old records  I have discussed plan of care as described above with RN , patient on 01/27/2020   FFlorencia ReasonsMD, PhD, FACP  [facility]  Available via Epic secure chat 7am-7pm for nonurgent issues Please page for urgent issues, pager number available through [email or web address] .   01/27/2020, 12:06 PM  LOS: 3 days

## 2020-01-27 NOTE — Anesthesia Preprocedure Evaluation (Signed)
Anesthesia Evaluation  Patient identified by MRN, date of birth, ID band Patient awake    Reviewed: Allergy & Precautions, NPO status , Patient's Chart, lab work & pertinent test results  Airway Mallampati: III  TM Distance: >3 FB Neck ROM: Full    Dental  (+) Edentulous Upper, Edentulous Lower   Pulmonary former smoker,    Pulmonary exam normal breath sounds clear to auscultation       Cardiovascular hypertension, Pt. on medications + Peripheral Vascular Disease and +CHF  Normal cardiovascular exam Rhythm:Regular Rate:Normal  ILN:[id number] rhythm Atrial premature complexes in couplets Left posterior fascicular block  ECHO: 1. Left ventricular ejection fraction, by estimation, is 55%. The left ventricle has normal function. The left ventricle has no regional wall motion abnormalities. There is mild left ventricular hypertrophy. Left ventricular diastolic parameters are consistent with Grade III diastolic dysfunction (restrictive). Elevated left ventricular end-diastolic pressure. 2. Right ventricular systolic function is normal. The right ventricular size is moderately enlarged. There is mildly elevated pulmonary artery systolic pressure. 3. Left atrial size was severely dilated. 4. Right atrial size was moderately dilated. 5. Large pleural effusion. 6. The mitral valve is normal in structure. Mild mitral valve regurgitation. No evidence of mitral stenosis. 7. Tricuspid valve regurgitation is mild to moderate. 8. The aortic valve is tricuspid. Aortic valve regurgitation is not visualized. No aortic stenosis is present. 9. The inferior vena cava is dilated in size with <50% respiratory variability, suggesting right atrial pressure of 15 mmHg.   Neuro/Psych PSYCHIATRIC DISORDERS Depression TIA   GI/Hepatic negative GI ROS, Neg liver ROS,   Endo/Other  diabetesHypothyroidism   Renal/GU CRFRenal disease      Musculoskeletal  (+) Arthritis ,   Abdominal   Peds  Hematology  (+) anemia , HLD   Anesthesia Other Findings GI bleed  Reproductive/Obstetrics                             Anesthesia Physical Anesthesia Plan  ASA: III  Anesthesia Plan: MAC   Post-op Pain Management:    Induction: Intravenous  PONV Risk Score and Plan: 2 and Propofol infusion and Treatment may vary due to age or medical condition  Airway Management Planned: Nasal Cannula  Additional Equipment:   Intra-op Plan:   Post-operative Plan:   Informed Consent: I have reviewed the patients History and Physical, chart, labs and discussed the procedure including the risks, benefits and alternatives for the proposed anesthesia with the patient or authorized representative who has indicated his/her understanding and acceptance.       Plan Discussed with: CRNA  Anesthesia Plan Comments:         Anesthesia Quick Evaluation

## 2020-01-28 LAB — CBC WITH DIFFERENTIAL/PLATELET
Abs Immature Granulocytes: 0.02 10*3/uL (ref 0.00–0.07)
Basophils Absolute: 0 10*3/uL (ref 0.0–0.1)
Basophils Relative: 0 %
Eosinophils Absolute: 0.1 10*3/uL (ref 0.0–0.5)
Eosinophils Relative: 2 %
HCT: 36.5 % (ref 36.0–46.0)
Hemoglobin: 11.6 g/dL — ABNORMAL LOW (ref 12.0–15.0)
Immature Granulocytes: 0 %
Lymphocytes Relative: 13 %
Lymphs Abs: 0.8 10*3/uL (ref 0.7–4.0)
MCH: 28.3 pg (ref 26.0–34.0)
MCHC: 31.8 g/dL (ref 30.0–36.0)
MCV: 89 fL (ref 80.0–100.0)
Monocytes Absolute: 0.5 10*3/uL (ref 0.1–1.0)
Monocytes Relative: 7 %
Neutro Abs: 5.2 10*3/uL (ref 1.7–7.7)
Neutrophils Relative %: 78 %
Platelets: 163 10*3/uL (ref 150–400)
RBC: 4.1 MIL/uL (ref 3.87–5.11)
RDW: 16.6 % — ABNORMAL HIGH (ref 11.5–15.5)
WBC: 6.6 10*3/uL (ref 4.0–10.5)
nRBC: 0 % (ref 0.0–0.2)

## 2020-01-28 LAB — GLUCOSE, CAPILLARY
Glucose-Capillary: 78 mg/dL (ref 70–99)
Glucose-Capillary: 97 mg/dL (ref 70–99)
Glucose-Capillary: 155 mg/dL — ABNORMAL HIGH (ref 70–99)

## 2020-01-28 LAB — COMPREHENSIVE METABOLIC PANEL
ALT: 258 U/L — ABNORMAL HIGH (ref 0–44)
AST: 75 U/L — ABNORMAL HIGH (ref 15–41)
Albumin: 2.7 g/dL — ABNORMAL LOW (ref 3.5–5.0)
Alkaline Phosphatase: 120 U/L (ref 38–126)
Anion gap: 9 (ref 5–15)
BUN: 80 mg/dL — ABNORMAL HIGH (ref 8–23)
CO2: 27 mmol/L (ref 22–32)
Calcium: 8.5 mg/dL — ABNORMAL LOW (ref 8.9–10.3)
Chloride: 105 mmol/L (ref 98–111)
Creatinine, Ser: 2.04 mg/dL — ABNORMAL HIGH (ref 0.44–1.00)
GFR calc Af Amer: 25 mL/min — ABNORMAL LOW (ref >60)
GFR calc non Af Amer: 21 mL/min — ABNORMAL LOW (ref >60)
Glucose, Bld: 95 mg/dL (ref 70–99)
Potassium: 3.8 mmol/L (ref 3.5–5.1)
Sodium: 141 mmol/L (ref 135–145)
Total Bilirubin: 1.1 mg/dL (ref 0.3–1.2)
Total Protein: 6.4 g/dL — ABNORMAL LOW (ref 6.5–8.1)

## 2020-01-28 LAB — MAGNESIUM: Magnesium: 2.1 mg/dL (ref 1.7–2.4)

## 2020-01-28 LAB — PROTIME-INR
INR: 1.2 (ref 0.8–1.2)
Prothrombin Time: 14.4 seconds (ref 11.4–15.2)

## 2020-01-28 MED ORDER — PANTOPRAZOLE SODIUM 40 MG PO TBEC: 40.0000 mg | DELAYED_RELEASE_TABLET | Freq: Two times a day (BID) | ORAL | Status: DC

## 2020-01-28 MED ORDER — PANTOPRAZOLE SODIUM 40 MG PO TBEC
40.0000 mg | DELAYED_RELEASE_TABLET | Freq: Every day | ORAL | Status: DC
  Administered 2020-01-29 – 2020-02-03 (×6): 40 mg via ORAL
  Filled 2020-01-28 (×6): qty 1

## 2020-01-28 MED ORDER — ORAL CARE MOUTH RINSE
15.0000 mL | Freq: Two times a day (BID) | OROMUCOSAL | Status: DC
  Administered 2020-01-28 – 2020-02-02 (×5): 15 mL via OROMUCOSAL

## 2020-01-28 NOTE — Progress Notes (Signed)
PROGRESS NOTE  Gina [name] RUE:[id number] DOB: [birth year] DOA: 01/23/2020 PCP: [name], NP  Brief Narrative:  86 year old with a history of gastric ulcer/AV malformations, PVD status post bilateral femoropopliteal bypasses, chronic hypoxic respiratory failure on 2 L, HTN, HLD, DM, CKD stage IV, mesenteric ischemia, Graves' disease, and diastolic congestive heart failure who presented to the ED complaining of weakness.  The patient was very lethargic and unable to provide a reliable history.  She did report significant lower extremity swelling right greater than left for at least 48 hours as well as dyspnea on exertion.  Reportedly she lives alone and EMS had to break into her home to care for her.  She was found to be saturating 80% on room air with a fever of 102.2.  The patient was discharged 3/30 after an admit for symptomatic anemia with a hemoglobin of 7 > 10.  She was transfused 1 unit of PRBCs and underwent an EGD which noted multiple large angiodysplastic lesions with bleeding in the gastric fundus and body that was treated.  She was also confirmed to have angiodysplastic lesions in the duodenum and jejunum with the plan being to repeat an EGD in 2 weeks.   HPI/Recap of past 24 hours:    She appear weak, very poor oral intake, denies pain, no fever She reports breathing is better  Assessment/Plan: Principal Problem:   Acute GI bleeding Active Problems:   HLD (hyperlipidemia)   Essential hypertension   Diabetes mellitus, type 2 (HCC)   Acute metabolic encephalopathy   Acute on chronic diastolic CHF (congestive heart failure) (HCC)   Acute on chronic respiratory failure with hypoxia (HCC)   Transaminitis   Acute kidney injury superimposed on chronic kidney disease (HCC)   Recurrent acute on chronic symptomatic anemia, history of GI AVMs -Hemoglobin 7.6 on presentation, FOBT + -s/p 1 unit of PRBCs on presentation  -Hemoglobin has been stable since  transfusion, Hgb 10.0  -9.7 - EGD "Multiple non-bleeding angiodysplastic lesions in the stomach. 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 This area was ablated with APC." -Continue PPI once a day -GI Dr. Benson Norway input appreciated, will follow recommendation   Acute hepatitis A with acute liver failure -Presented with fever, acute elevated LFT -Hepatitis A IgM positive -Improving -GI consulted  Acute metabolic encephalopathy -Present with lethargy, improving, alert, though not oriented to time, not sure if this is baseline  Acute on chronic hypoxic respiratory failure , baseline on 2 L, -Required 4-5 L in the hospital -Acute on chronic diastolic CHF with bilateral pleural effusion --Echocardiogram left ventricular EF 55%, Left ventricular diastolic parameters are  consistent with Grade III diastolic dysfunction (restrictive).  Trivial pericardial effusion  -She started to have wheezing on exam , repeat chest x-ray showed"Moderate to large layering bilateral pleural effusions with underlying consolidation."start Lasix drip in the setting of renal impairment, change to IV Lasix 80 mg twice a day  -less wheezing on exam, continue iv lasix, repeat cxr in am --Close monitor volume status, I's and O's    AKI on CKD4 -Urine culture with multiple species none dominant -renal ultrasound "Normal-appearing kidneys without hydronephrosis.  Bladder wall thickening which may be related incomplete distension. A focal septation is noted within the bladder of uncertain significance. Direct visualization may be helpful as necessary. -She denies urinary symptom, fever subsided, will monitor off antibiotics -AKI might have component of cardiorenal syndrome as well ,creatinine improved on IV  Lasix -Renal dosing meds  Hypokalemia, replaced K, monitor   HTN: Home medication Norvasc hydralazine held since admission BP around 150 ,restart   Hydralazine, on IV Lasix    Diet controlled diabetes A.m. blood glucose 100 A1c 6 Continue diet control  PVD status post bilateral femoropopliteal bypasses Continue Lipitor Not on aspirin likely due to GI bleed  Right knee pain status post fall Was released from her SNF after a rehab stay on 4/15  Stage III right patellar ulcer  Body mass index is 21.3 kg/m.     FTT,  therapy eval, skilled nursing facility placement  DVT Prophylaxis: SCDs  Code Status: Full  Family Communication: niece annette at bedside with permission on April 28  Disposition Plan:    Patient came from:                     Home                                                                                      Anticipated d/c place:  TBD  Barriers to d/c OR conditions which need to be met to effect a safe d/c:  acute on chronic CHF needs iv lasix , repeat cxr, then snf  Consultants:  GI  Procedures:  PRBC transfusion  EGD on April 29  Antibiotics:  None   Objective: BP (!) 154/44 (BP Location: Right Arm)   Pulse 69   Temp 97.9 F (36.6 C) (Axillary)   Resp 16   Ht '5\' 7"'$  (1.702 m)   Wt 61.7 kg   SpO2 94%   BMI 21.30 kg/m   Intake/Output Summary (Last 24 hours) at 01/28/2020 0703 Last data filed at 01/28/2020 0536 Gross per 24 hour  Intake 230 ml  Output 900 ml  Net -670 ml   Filed Weights   01/25/20 0406 01/27/20 0400 01/28/20 0342  Weight: 59 kg 60.2 kg 61.7 kg    Exam: Patient is examined daily including today on 01/28/2020, exams remain the same as of yesterday except that has changed    General: [name], but alert this am  Cardiovascular: RRR  Respiratory:  mild wheezing bilateral  Abdomen: Soft/ND/NT, positive BS  Musculoskeletal: No Edema  Neuro: alert, but appear very weak  Data Reviewed: Basic Metabolic Panel: Recent Labs  Lab 01/23/20 2300 01/24/20 1138 01/25/20 0710 01/26/20 0541 01/27/20 0341  NA 140 141 143 140 139  K 3.7 3.5 3.9  3.0* 3.9  CL 103 103 109 106 105  CO2 23 25 20* 24 26  GLUCOSE 164* 105* 100* 95 97  BUN 112* 109* 105* 92* 82*  CREATININE 2.98* 2.87* 2.75* 2.52* 2.23*  CALCIUM 8.5* 8.7* 8.3* 8.0* 8.3*  MG  --   --   --   --  2.0   Liver Function Tests: Recent Labs  Lab 01/23/20 2300 01/24/20 1138 01/25/20 0710 01/26/20 0541 01/27/20 0341  AST 1,310* 1,477* 1,080* 356* 150*  ALT 679* 850* 843* 546* 355*  ALKPHOS 182* 185* 163* 133* 124  BILITOT 1.3* 1.4* 1.3* 1.2 1.1  PROT 6.4* 6.9 6.1* 5.9* 5.9*  ALBUMIN 3.0* 3.2* 2.8* 2.5* 2.5*  No results for input(s): LIPASE, AMYLASE in the last 168 hours. Recent Labs  Lab 01/24/20 1127  AMMONIA 23   CBC: Recent Labs  Lab 01/23/20 2300 01/23/20 2300 01/24/20 1138 01/24/20 1551 01/25/20 0942 01/26/20 0541 01/27/20 0341  WBC 6.8   < > 6.4 7.0 6.4 6.2 6.8  NEUTROABS 6.0  --   --   --   --  5.0 5.3  HGB 7.6*   < > 9.9* 10.1* 10.3* 9.7* 10.3*  HCT 24.8*   < > 31.4* 31.5* 33.2* 30.6* 32.4*  MCV 95.4   < > 90.0 89.7 92.0 89.7 88.8  PLT 225   < > 180 163 150 119* 143*   < > = values in this interval not displayed.   Cardiac Enzymes:   Recent Labs  Lab 01/24/20 1127  CKTOTAL 443*   BNP (last 3 results) Recent Labs    10/26/19 1027 12/23/19 1624 01/23/20 2300  BNP 756.7* 724.7* 2,946.3*    ProBNP (last 3 results) No results for input(s): PROBNP in the last 8760 hours.  CBG: Recent Labs  Lab 01/26/20 2107 01/27/20 0731 01/27/20 1233 01/27/20 1819 01/27/20 2104  GLUCAP 134* 107* 111* 161* 143*    Recent Results (from the past 240 hour(s))  Respiratory Panel by RT PCR (Flu A&B, Covid) - Nasopharyngeal Swab     Status: None   Collection Time: 01/23/20 11:47 PM   Specimen: Nasopharyngeal Swab  Result Value Ref Range Status   SARS Coronavirus 2 by RT PCR NEGATIVE NEGATIVE Final    Comment: (NOTE) SARS-CoV-2 target nucleic acids are NOT DETECTED. The SARS-CoV-2 RNA is generally detectable in upper respiratoy specimens  during the acute phase of infection. The lowest concentration of SARS-CoV-2 viral copies this assay can detect is 131 copies/mL. A negative result does not preclude SARS-Cov-2 infection and should not be used as the sole basis for treatment or other patient management decisions. A negative result may occur with  improper specimen collection/handling, submission of specimen other than nasopharyngeal swab, presence of viral mutation(s) within the areas targeted by this assay, and inadequate number of viral copies (<131 copies/mL). A negative result must be combined with clinical observations, patient history, and epidemiological information. The expected result is Negative. Fact Sheet for Patients:  PinkCheek.be Fact Sheet for Healthcare Providers:  GravelBags.it This test is not yet ap proved or cleared by the [place] FDA and  has been authorized for detection and/or diagnosis of SARS-CoV-2 by FDA under an Emergency Use Authorization (EUA). This EUA will remain  in effect (meaning this test can be used) for the duration of the COVID-19 declaration under Section 564(b)(1) of the Act, 21 U.S.C. section 360bbb-3(b)(1), unless the authorization is terminated or revoked sooner.    Influenza A by PCR NEGATIVE NEGATIVE Final   Influenza B by PCR NEGATIVE NEGATIVE Final    Comment: (NOTE) The Xpert Xpress SARS-CoV-2/FLU/RSV assay is intended as an aid in  the diagnosis of influenza from Nasopharyngeal swab specimens and  should not be used as a sole basis for treatment. Nasal washings and  aspirates are unacceptable for Xpert Xpress SARS-CoV-2/FLU/RSV  testing. Fact Sheet for Patients: PinkCheek.be Fact Sheet for Healthcare Providers: GravelBags.it This test is not yet approved or cleared by the [place] FDA and  has been authorized for detection and/or diagnosis of  SARS-CoV-2 by  FDA under an Emergency Use Authorization (EUA). This EUA will remain  in effect (meaning this test can be used) for the duration of  the  Covid-19 declaration under Section 564(b)(1) of the Act, 21  U.S.C. section 360bbb-3(b)(1), unless the authorization is  terminated or revoked. Performed at [facility], [place] [address] [place], [place] [address]   Urine culture     Status: Abnormal   Collection Time: 01/24/20 11:55 AM   Specimen: Urine, Random  Result Value Ref Range Status   Specimen Description URINE, RANDOM  Final   Special Requests   Final    NONE Performed at [facility], [place] [address], [place], [place] [address]    Culture MULTIPLE SPECIES PRESENT, SUGGEST RECOLLECTION (A)  Final   Report Status 01/25/2020 FINAL  Final  MRSA PCR Screening     Status: None   Collection Time: 01/24/20  2:02 PM   Specimen: Nasopharyngeal  Result Value Ref Range Status   MRSA by PCR NEGATIVE NEGATIVE Final    Comment:        The GeneXpert MRSA Assay (FDA approved for NASAL specimens only), is one component of a comprehensive MRSA colonization surveillance program. It is not intended to diagnose MRSA infection nor to guide or monitor treatment for MRSA infections. Performed at [facility], [place] [address] [place], [place] [address]      Studies: No results found.  Scheduled Meds: . furosemide  80 mg Intravenous BID  . guaiFENesin  600 mg Oral BID  . hydrALAZINE  50 mg Oral Q8H  . insulin aspart  0-9 Units Subcutaneous TID WC  . latanoprost  1 drop Both Eyes QHS  . levothyroxine  112 mcg Oral Daily  . mouth rinse  15 mL Mouth Rinse BID  . Netarsudil Dimesylate  1 drop Both Eyes QHS  . pantoprazole  40 mg Intravenous Q12H    Continuous Infusions:    Time spent: 57mns,  I have personally reviewed and interpreted on  01/28/2020 daily labs, tele strips, imagings as discussed above under date review session and assessment and plans.  I  reviewed all nursing notes, pharmacy notes, consultant notes,  vitals, pertinent old records  I have discussed plan of care as described above with RN , patient on 01/28/2020   FFlorencia ReasonsMD, PhD, FACP  [facility]  Available via Epic secure chat 7am-7pm for nonurgent issues Please page for urgent issues, pager number available through [employer] .   01/28/2020, 7:03 AM  LOS: 4 days

## 2020-01-28 NOTE — Plan of Care (Signed)

## 2020-01-28 NOTE — Plan of Care (Signed)
?  Problem: Clinical Measurements: ?Goal: Will remain free from infection ?Outcome: Progressing ?  ?

## 2020-01-28 NOTE — Progress Notes (Signed)
Physical Therapy Treatment Patient Details Name: Gina [name] MRN: [id number] DOB: 05-06-1929 Today's Date: 01/28/2020    History of Present Illness 86 year old female who sustained another fall at home after discharge from rehab on 4/15, now in acute respiratory failure, acute Hep A with liver failure, acute metabolic encephalopathy, transaminase.  Patient with AKI, FTT, CHF. PMH:  CHF, DM, HTN, PVD, HLD, CKD IIIb, anemia, breast CA s/p mastectomy,  multiple large angiodysplastic lesions with bleeding in gastric fundus/body , angiodysplastic lesion without bleeding in duodenum and jejunum.      PT Comments    Patient presents with heel pain, decreased strength, impaired balance, and required two person assist for transfer to chair using RW. Recommend continued skilled PT services and discharge to SNF for short term rehabilitation. Patient is currently at high risk for falls and requires hands on assistance for safe mobility.    Follow Up Recommendations  SNF     Equipment Recommendations  3in1 (PT);Rolling walker with 5" wheels;Wheelchair (measurements PT);Wheelchair cushion (measurements PT);Hospital bed       Precautions / Restrictions Precautions Precautions: Fall Restrictions Weight Bearing Restrictions: No    Mobility  Bed Mobility Overal bed mobility: Needs Assistance Bed Mobility: Supine to Sit     Supine to sit: Mod assist;+2 for physical assistance;+2 for safety/equipment;HOB elevated;Max assist        Transfers Overall transfer level: Needs assistance Equipment used: Rolling walker (2 wheeled) Transfers: Stand Pivot Transfers   Stand pivot transfers: Mod assist;+2 physical assistance;+2 safety/equipment;From elevated surface;Max assist       General transfer comment: stand-step transfer EOB>chair with max cues for technique  Ambulation/Gait   General Gait Details: deferred at this time due to patient's complaint of heel pain     Balance Overall  balance assessment: Needs assistance Sitting-balance support: Feet supported Sitting balance-Leahy Scale: Fair Sitting balance - Comments: Required assist for anterior scooting toward EOB  Postural control: Posterior lean(initially upon standing) Standing balance support: Bilateral upper extremity supported Standing balance-Leahy Scale: Poor     Cognition Arousal/Alertness: Lethargic;Awake/alert Behavior During Therapy: WFL for tasks assessed/performed Overall Cognitive Status: No family/caregiver present to determine baseline cognitive functioning       General Comments General comments (skin integrity, edema, etc.): Patient on 4L HFNC. BP 151/84 sitting EOB. Desatted briefly with transfer to chair but quickly increases to >/=90%.       Pertinent Vitals/Pain Pain Assessment: Faces Faces Pain Scale: Hurts little more Pain Location: bilat heels Pain Descriptors / Indicators: Other (Comment)(voicing her heels hurt, did not rate numerically)     PT Goals (current goals can now be found in the care plan section) Progress towards PT goals: Progressing toward goals    Frequency    Min 3X/week      PT Plan Current plan remains appropriate       AM-PAC PT "6 Clicks" Mobility   Outcome Measure  Help needed turning from your back to your side while in a flat bed without using bedrails?: A Little Help needed moving from lying on your back to sitting on the side of a flat bed without using bedrails?: A Lot Help needed moving to and from a bed to a chair (including a wheelchair)?: A Lot Help needed standing up from a chair using your arms (e.g., wheelchair or bedside chair)?: A Lot Help needed to walk in hospital room?: A Lot Help needed climbing 3-5 steps with a railing? : Total 6 Click Score: 12  End of Session Equipment Utilized During Treatment: Oxygen;Gait belt Activity Tolerance: Patient limited by pain;Patient limited by fatigue Patient left: in chair;with call  bell/phone within reach;with chair alarm set Nurse Communication: Mobility status;Other (comment)(heel pain, offloading booties ) PT Visit Diagnosis: Muscle weakness (generalized) (M62.81);History of falling (Z91.81);Difficulty in walking, not elsewhere classified (R26.2);Adult, failure to thrive (R62.7);Pain     Time: 1130-1158 PT Time Calculation (min) (ACUTE ONLY): 28 min  Charges:  $Therapeutic Activity: 23-37 mins                    Birdie Hopes, PT, DPT Acute Rehab [phone] office     Birdie Hopes 01/28/2020, 12:21 PM

## 2020-01-28 NOTE — Progress Notes (Signed)
   01/28/20 0342  Assess: MEWS Score  Temp 97.9 F (36.6 C)  BP (!) 154/44  Pulse Rate 69  ECG Heart Rate 69  Resp 16  Level of Consciousness Alert  SpO2 94 %  O2 Device Nasal Cannula  Patient Activity (if Appropriate) In bed  O2 Flow Rate (L/min) 4 L/min  Assess: MEWS Score  MEWS Temp 0  MEWS Systolic 0  MEWS Pulse 0  MEWS RR 0  MEWS LOC 0  MEWS Score 0  MEWS Score Color Green  Assess: if the MEWS score is Yellow or Red  Were vital signs taken at a resting state? Yes  Focused Assessment Documented focused assessment  Early Detection of Sepsis Score *See Row Information* Low  MEWS guidelines implemented *See Row Information* No, other (Comment) (no acute changes)

## 2020-01-28 NOTE — Progress Notes (Signed)
Pt refused morning labs, despite multiple attempts to orient pt and and explain the need for lab draws.

## 2020-01-29 LAB — PROTIME-INR
INR: 1.2 (ref 0.8–1.2)
Prothrombin Time: 14.5 seconds (ref 11.4–15.2)

## 2020-01-29 LAB — MAGNESIUM: Magnesium: 2.1 mg/dL (ref 1.7–2.4)

## 2020-01-29 LAB — GLUCOSE, CAPILLARY
Glucose-Capillary: 105 mg/dL — ABNORMAL HIGH (ref 70–99)
Glucose-Capillary: 111 mg/dL — ABNORMAL HIGH (ref 70–99)

## 2020-01-29 MED ORDER — MORPHINE SULFATE (PF) 2 MG/ML IV SOLN
2.0000 mg | Freq: Once | INTRAVENOUS | Status: AC
  Administered 2020-01-29: 2 mg via INTRAVENOUS
  Filled 2020-01-29: qty 1

## 2020-01-29 MED ORDER — FUROSEMIDE 80 MG PO TABS
80.0000 mg | ORAL_TABLET | Freq: Two times a day (BID) | ORAL | Status: DC
  Administered 2020-01-29 – 2020-01-30 (×2): 80 mg via ORAL
  Filled 2020-01-29 (×2): qty 1

## 2020-01-29 MED ORDER — CARVEDILOL 3.125 MG PO TABS
3.1250 mg | ORAL_TABLET | Freq: Two times a day (BID) | ORAL | Status: DC
  Administered 2020-01-29 – 2020-02-03 (×11): 3.125 mg via ORAL
  Filled 2020-01-29 (×11): qty 1

## 2020-01-29 NOTE — Progress Notes (Signed)
Attempted to give patient her coreg and lasix and patient refused to take it.  Stated she wanted to be left alone and she wasn't going to take anything from me.  Patient went on to state that if we did not leave her alone, she was "going to call the police".  Patient would not let this nurse look at her right arm to assess swelling.

## 2020-01-29 NOTE — Progress Notes (Signed)
PROGRESS NOTE  Gina [name] ERD:[id number] DOB: January 05, 1929 DOA: 01/23/2020 PCP: [name], NP  Brief Narrative:  86 year old with a history of gastric ulcer/AV malformations, PVD status post bilateral femoropopliteal bypasses, chronic hypoxic respiratory failure on 2 L, HTN, HLD, DM, CKD stage IV, mesenteric ischemia, Graves' disease, and diastolic congestive heart failure who presented to the ED complaining of weakness.  The patient was very lethargic and unable to provide a reliable history.  She did report significant lower extremity swelling right greater than left for at least 48 hours as well as dyspnea on exertion.  Reportedly she lives alone and EMS had to break into her home to care for her.  She was found to be saturating 80% on room air with a fever of 102.2.  The patient was discharged 3/30 after an admit for symptomatic anemia with a hemoglobin of 7 > 10.  She was transfused 1 unit of PRBCs and underwent an EGD which noted multiple large angiodysplastic lesions with bleeding in the gastric fundus and body that was treated.  She was also confirmed to have angiodysplastic lesions in the duodenum and jejunum with the plan being to repeat an EGD in 2 weeks.   HPI/Recap of past 24 hours:   1.5liter urine output, oral intake has improved some She appear weak,  denies pain, no fever   Assessment/Plan: Principal Problem:   Acute GI bleeding Active Problems:   HLD (hyperlipidemia)   Essential hypertension   Diabetes mellitus, type 2 (HCC)   Acute metabolic encephalopathy   Acute on chronic diastolic CHF (congestive heart failure) (HCC)   Acute on chronic respiratory failure with hypoxia (HCC)   Transaminitis   Acute kidney injury superimposed on chronic kidney disease (HCC)   Recurrent acute on chronic symptomatic anemia, history of GI AVMs -Hemoglobin 7.6 on presentation, FOBT + -s/p 1 unit of PRBCs on presentation  -Hemoglobin has been stable since transfusion,  Hgb 10.0  -9.7 - EGD "Multiple non-bleeding angiodysplastic lesions in the stomach. 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 This area was ablated with APC." -Continue PPI once a day -GI Dr. Benson Norway input appreciated, will follow recommendation   Acute hepatitis A with acute liver failure -Presented with fever, acute elevated LFT -Hepatitis A IgM positive -Improving -GI consulted  Acute metabolic encephalopathy -Present with lethargy, improving, alert, though not oriented to time, not sure if this is baseline  Acute on chronic hypoxic respiratory failure , baseline on 2 L, -Required 4-5 L in the hospital -Acute on chronic diastolic CHF with bilateral pleural effusion --Echocardiogram left ventricular EF 55%, Left ventricular diastolic parameters are  consistent with Grade III diastolic dysfunction (restrictive).  Trivial pericardial effusion  -She started to have wheezing on exam , repeat chest x-ray showed"Moderate to large layering bilateral pleural effusions with underlying consolidation."start Lasix drip in the setting of renal impairment, change to IV Lasix 80 mg twice a day  -less wheezing on exam, continue iv lasix,plan to repeat cxr in am --Close monitor volume status, I's and O's    AKI on CKD4 -Urine culture with multiple species none dominant -renal ultrasound "Normal-appearing kidneys without hydronephrosis.  Bladder wall thickening which may be related incomplete distension. A focal septation is noted within the bladder of uncertain significance. Direct visualization may be helpful as necessary. -She denies urinary symptom, fever subsided, will monitor off antibiotics -AKI might have component of cardiorenal syndrome as well ,creatinine improved on  IV Lasix -Renal dosing meds  Hypokalemia, replaced K, monitor   HTN: Home medication Norvasc hydralazine held since admission BP around 150 ,restart  Hydralazine, on  IV Lasix    Diet controlled diabetes A.m. blood glucose 100 A1c 6 Continue diet control  PVD status post bilateral femoropopliteal bypasses Continue Lipitor Not on aspirin likely due to GI bleed  Right knee pain status post fall Was released from her SNF after a rehab stay on 4/15  Stage III right patellar ulcer  Body mass index is 21.3 kg/m.     FTT,  therapy eval, skilled nursing facility placement  DVT Prophylaxis: SCDs  Code Status: Full  Family Communication: niece annette at bedside with permission on April 28  Disposition Plan:    Patient came from:                     Home                                                                                      Anticipated d/c place:  snf  Barriers to d/c OR conditions which need to be met to effect a safe d/c:  acute on chronic CHF needs iv lasix , repeat cxr, then snf  Consultants:  GI  Procedures:  PRBC transfusion  EGD on April 29  Antibiotics:  None   Objective: BP (!) 159/97 (BP Location: Left Arm)   Pulse 74   Temp 98 F (36.7 C) (Oral)   Resp 18   Ht '5\' 7"'$  (1.702 m)   Wt 61.7 kg   SpO2 93%   BMI 21.30 kg/m   Intake/Output Summary (Last 24 hours) at 01/29/2020 0713 Last data filed at 01/28/2020 2100 Gross per 24 hour  Intake 840 ml  Output 1455 ml  Net -615 ml   Filed Weights   01/25/20 0406 01/27/20 0400 01/28/20 0342  Weight: 59 kg 60.2 kg 61.7 kg    Exam: Patient is examined daily including today on 01/29/2020, exams remain the same as of yesterday except that has changed    General: Gina [name], sleepy  Cardiovascular: RRR  Respiratory:  mild wheezing bilateral  Abdomen: Soft/ND/NT, positive BS  Musculoskeletal: No Edema  Neuro: sleepy and  very weak  Data Reviewed: Basic Metabolic Panel: Recent Labs  Lab 01/24/20 1138 01/25/20 0710 01/26/20 0541 01/27/20 0341 01/28/20 0958  NA 141 143 140 139 141  K 3.5 3.9 3.0* 3.9 3.8  CL 103 109 106 105 105  CO2  25 20* '24 26 27  '$ GLUCOSE 105* 100* 95 97 95  BUN 109* 105* 92* 82* 80*  CREATININE 2.87* 2.75* 2.52* 2.23* 2.04*  CALCIUM 8.7* 8.3* 8.0* 8.3* 8.5*  MG  --   --   --  2.0 2.1   Liver Function Tests: Recent Labs  Lab 01/24/20 1138 01/25/20 0710 01/26/20 0541 01/27/20 0341 01/28/20 0958  AST 1,477* 1,080* 356* 150* 75*  ALT 850* 843* 546* 355* 258*  ALKPHOS 185* 163* 133* 124 120  BILITOT 1.4* 1.3* 1.2 1.1 1.1  PROT 6.9 6.1* 5.9* 5.9* 6.4*  ALBUMIN 3.2* 2.8* 2.5* 2.5* 2.7*   No results  for input(s): LIPASE, AMYLASE in the last 168 hours. Recent Labs  Lab 01/24/20 1127  AMMONIA 23   CBC: Recent Labs  Lab 01/23/20 2300 01/24/20 1138 01/24/20 1551 01/25/20 0942 01/26/20 0541 01/27/20 0341 01/28/20 0958  WBC 6.8   < > 7.0 6.4 6.2 6.8 6.6  NEUTROABS 6.0  --   --   --  5.0 5.3 5.2  HGB 7.6*   < > 10.1* 10.3* 9.7* 10.3* 11.6*  HCT 24.8*   < > 31.5* 33.2* 30.6* 32.4* 36.5  MCV 95.4   < > 89.7 92.0 89.7 88.8 89.0  PLT 225   < > 163 150 119* 143* 163   < > = values in this interval not displayed.   Cardiac Enzymes:   Recent Labs  Lab 01/24/20 1127  CKTOTAL 443*   BNP (last 3 results) Recent Labs    10/26/19 1027 12/23/19 1624 01/23/20 2300  BNP 756.7* 724.7* 2,946.3*    ProBNP (last 3 results) No results for input(s): PROBNP in the last 8760 hours.  CBG: Recent Labs  Lab 01/27/20 1819 01/27/20 2104 01/28/20 0752 01/28/20 1227 01/28/20 1721  GLUCAP 161* 143* 78 97 155*    Recent Results (from the past 240 hour(s))  Respiratory Panel by RT PCR (Flu A&B, Covid) - Nasopharyngeal Swab     Status: None   Collection Time: 01/23/20 11:47 PM   Specimen: Nasopharyngeal Swab  Result Value Ref Range Status   SARS Coronavirus 2 by RT PCR NEGATIVE NEGATIVE Final    Comment: (NOTE) SARS-CoV-2 target nucleic acids are NOT DETECTED. The SARS-CoV-2 RNA is generally detectable in upper respiratoy specimens during the acute phase of infection. The  lowest concentration of SARS-CoV-2 viral copies this assay can detect is 131 copies/mL. A negative result does not preclude SARS-Cov-2 infection and should not be used as the sole basis for treatment or other patient management decisions. A negative result may occur with  improper specimen collection/handling, submission of specimen other than nasopharyngeal swab, presence of viral mutation(s) within the areas targeted by this assay, and inadequate number of viral copies (<131 copies/mL). A negative result must be combined with clinical observations, patient history, and epidemiological information. The expected result is Negative. Fact Sheet for Patients:  PinkCheek.be Fact Sheet for Healthcare Providers:  GravelBags.it This test is not yet ap proved or cleared by the [place] FDA and  has been authorized for detection and/or diagnosis of SARS-CoV-2 by FDA under an Emergency Use Authorization (EUA). This EUA will remain  in effect (meaning this test can be used) for the duration of the COVID-19 declaration under Section 564(b)(1) of the Act, 21 U.S.C. section 360bbb-3(b)(1), unless the authorization is terminated or revoked sooner.    Influenza A by PCR NEGATIVE NEGATIVE Final   Influenza B by PCR NEGATIVE NEGATIVE Final    Comment: (NOTE) The Xpert Xpress SARS-CoV-2/FLU/RSV assay is intended as an aid in  the diagnosis of influenza from Nasopharyngeal swab specimens and  should not be used as a sole basis for treatment. Nasal washings and  aspirates are unacceptable for Xpert Xpress SARS-CoV-2/FLU/RSV  testing. Fact Sheet for Patients: PinkCheek.be Fact Sheet for Healthcare Providers: GravelBags.it This test is not yet approved or cleared by the [place] FDA and  has been authorized for detection and/or diagnosis of SARS-CoV-2 by  FDA under an Emergency  Use Authorization (EUA). This EUA will remain  in effect (meaning this test can be used) for the duration of the  Covid-19 declaration  under Section 564(b)(1) of the Act, 21  U.S.C. section 360bbb-3(b)(1), unless the authorization is  terminated or revoked. Performed at [facility], [place] [address] [place], [place] [address]   Urine culture     Status: Abnormal   Collection Time: 01/24/20 11:55 AM   Specimen: Urine, Random  Result Value Ref Range Status   Specimen Description URINE, RANDOM  Final   Special Requests   Final    NONE Performed at [facility], [place] [address], [place], [place] [address]    Culture MULTIPLE SPECIES PRESENT, SUGGEST RECOLLECTION (A)  Final   Report Status 01/25/2020 FINAL  Final  MRSA PCR Screening     Status: None   Collection Time: 01/24/20  2:02 PM   Specimen: Nasopharyngeal  Result Value Ref Range Status   MRSA by PCR NEGATIVE NEGATIVE Final    Comment:        The GeneXpert MRSA Assay (FDA approved for NASAL specimens only), is one component of a comprehensive MRSA colonization surveillance program. It is not intended to diagnose MRSA infection nor to guide or monitor treatment for MRSA infections. Performed at [facility], [place] [address] [place], [place] [address]      Studies: No results found.  Scheduled Meds: . furosemide  80 mg Intravenous BID  . guaiFENesin  600 mg Oral BID  . hydrALAZINE  50 mg Oral Q8H  . insulin aspart  0-9 Units Subcutaneous TID WC  . latanoprost  1 drop Both Eyes QHS  . levothyroxine  112 mcg Oral Daily  . mouth rinse  15 mL Mouth Rinse BID  . Netarsudil Dimesylate  1 drop Both Eyes QHS  . pantoprazole  40 mg Oral Daily    Continuous Infusions:    Time spent: 61mns,  I have personally reviewed and interpreted on  01/29/2020 daily labs, tele strips, imagings as discussed above under date review session and assessment and plans.  I reviewed all nursing notes, pharmacy notes,  consultant notes,  vitals, pertinent old records  I have discussed plan of care as described above with RN , patient on 01/29/2020   FFlorencia ReasonsMD, PhD, FACP  [facility]  Available via Epic secure chat 7am-7pm for nonurgent issues Please page for urgent issues, pager number available through [email or web address] .   01/29/2020, 7:13 AM  LOS: 5 days

## 2020-01-29 NOTE — Plan of Care (Signed)
  Problem: Clinical Measurements: Goal: Respiratory complications will improve Outcome: Progressing   Problem: Clinical Measurements: Goal: Cardiovascular complication will be avoided Outcome: Progressing   

## 2020-01-29 NOTE — Progress Notes (Signed)
Per Anderson Malta, RN verbal order from Dr. Erlinda Hong to remove IV r/t arm swelling; venous doppler ordered to rule out clot. Pt verbally refused another IV access.  Reported refusal to [place], Therapist, sports.  Delorise Shiner, MSN, RN

## 2020-01-30 ENCOUNTER — Inpatient Hospital Stay (HOSPITAL_COMMUNITY): Payer: Medicare Other

## 2020-01-30 DIAGNOSIS — R609 Edema, unspecified: Secondary | ICD-10-CM

## 2020-01-30 LAB — CBC
HCT: 36.2 % (ref 36.0–46.0)
Hemoglobin: 11.3 g/dL — ABNORMAL LOW (ref 12.0–15.0)
MCH: 28.1 pg (ref 26.0–34.0)
MCHC: 31.2 g/dL (ref 30.0–36.0)
MCV: 90 fL (ref 80.0–100.0)
Platelets: 170 10*3/uL (ref 150–400)
RBC: 4.02 MIL/uL (ref 3.87–5.11)
RDW: 16.3 % — ABNORMAL HIGH (ref 11.5–15.5)
WBC: 6.1 10*3/uL (ref 4.0–10.5)
nRBC: 0 % (ref 0.0–0.2)

## 2020-01-30 LAB — GLUCOSE, CAPILLARY
Glucose-Capillary: 103 mg/dL — ABNORMAL HIGH (ref 70–99)
Glucose-Capillary: 134 mg/dL — ABNORMAL HIGH (ref 70–99)
Glucose-Capillary: 149 mg/dL — ABNORMAL HIGH (ref 70–99)
Glucose-Capillary: 124 mg/dL — ABNORMAL HIGH (ref 70–99)

## 2020-01-30 LAB — PROTIME-INR
INR: 1.1 (ref 0.8–1.2)
Prothrombin Time: 14.1 seconds (ref 11.4–15.2)

## 2020-01-30 LAB — COMPREHENSIVE METABOLIC PANEL
ALT: 168 U/L — ABNORMAL HIGH (ref 0–44)
AST: 45 U/L — ABNORMAL HIGH (ref 15–41)
Albumin: 2.8 g/dL — ABNORMAL LOW (ref 3.5–5.0)
Alkaline Phosphatase: 104 U/L (ref 38–126)
Anion gap: 10 (ref 5–15)
BUN: 74 mg/dL — ABNORMAL HIGH (ref 8–23)
CO2: 26 mmol/L (ref 22–32)
Calcium: 8.8 mg/dL — ABNORMAL LOW (ref 8.9–10.3)
Chloride: 104 mmol/L (ref 98–111)
Creatinine, Ser: 1.81 mg/dL — ABNORMAL HIGH (ref 0.44–1.00)
GFR calc Af Amer: 28 mL/min — ABNORMAL LOW (ref >60)
GFR calc non Af Amer: 25 mL/min — ABNORMAL LOW (ref >60)
Glucose, Bld: 103 mg/dL — ABNORMAL HIGH (ref 70–99)
Potassium: 3.7 mmol/L (ref 3.5–5.1)
Sodium: 140 mmol/L (ref 135–145)
Total Bilirubin: 1 mg/dL (ref 0.3–1.2)
Total Protein: 6.4 g/dL — ABNORMAL LOW (ref 6.5–8.1)

## 2020-01-30 MED ORDER — SENNOSIDES-DOCUSATE SODIUM 8.6-50 MG PO TABS
1.0000 | ORAL_TABLET | Freq: Two times a day (BID) | ORAL | Status: DC
  Administered 2020-01-30 – 2020-02-02 (×8): 1 via ORAL
  Filled 2020-01-30 (×8): qty 1

## 2020-01-30 MED ORDER — POTASSIUM CHLORIDE CRYS ER 20 MEQ PO TBCR
40.0000 meq | EXTENDED_RELEASE_TABLET | Freq: Once | ORAL | Status: AC
  Administered 2020-01-30: 40 meq via ORAL
  Filled 2020-01-30: qty 2

## 2020-01-30 MED ORDER — FUROSEMIDE 10 MG/ML IJ SOLN
80.0000 mg | Freq: Four times a day (QID) | INTRAMUSCULAR | Status: DC
  Administered 2020-01-30 – 2020-01-31 (×3): 80 mg via INTRAVENOUS
  Filled 2020-01-30 (×3): qty 8

## 2020-01-30 MED ORDER — OXYCODONE HCL 5 MG PO TABS
5.0000 mg | ORAL_TABLET | Freq: Three times a day (TID) | ORAL | Status: DC | PRN
  Administered 2020-01-30 – 2020-01-31 (×2): 5 mg via ORAL
  Filled 2020-01-30 (×2): qty 1

## 2020-01-30 NOTE — Progress Notes (Signed)
Pt assessed not change from earlier assessment.

## 2020-01-30 NOTE — Progress Notes (Addendum)
PROGRESS NOTE  Gina [name] IDP:[id number] DOB: 10/18/1928 DOA: 01/23/2020 PCP: [name], NP  Brief Narrative:  86 year old with a history of gastric ulcer/AV malformations, PVD status post bilateral femoropopliteal bypasses, chronic hypoxic respiratory failure on 2 L, HTN, HLD, DM, CKD stage IV, mesenteric ischemia, Graves' disease, and diastolic congestive heart failure who presented to the ED complaining of weakness.  The patient was very lethargic and unable to provide a reliable history.  She did report significant lower extremity swelling right greater than left for at least 48 hours as well as dyspnea on exertion.  Reportedly she lives alone and EMS had to break into her home to care for her.  She was found to be saturating 80% on room air with a fever of 102.2.  The patient was discharged 3/30 after an admit for symptomatic anemia with a hemoglobin of 7 > 10.  She was transfused 1 unit of PRBCs and underwent an EGD which noted multiple large angiodysplastic lesions with bleeding in the gastric fundus and body that was treated.  She was also confirmed to have angiodysplastic lesions in the duodenum and jejunum with the plan being to repeat an EGD in 2 weeks.   HPI/Recap of past 24 hours:  Right arm edema, iv removed from right arm, she declined to have another iv placed   oral intake is poor, she appears weak but alert and interactive, she is talking to her niece over the phone  no fever   Assessment/Plan: Principal Problem:   Acute GI bleeding Active Problems:   HLD (hyperlipidemia)   Essential hypertension   Diabetes mellitus, type 2 (HCC)   Acute metabolic encephalopathy   Acute on chronic diastolic CHF (congestive heart failure) (HCC)   Acute on chronic respiratory failure with hypoxia (HCC)   Transaminitis   Acute kidney injury superimposed on chronic kidney disease (HCC)   Recurrent acute on chronic symptomatic anemia, history of GI AVMs -Hemoglobin 7.6  on presentation, FOBT + -s/p 1 unit of PRBCs on presentation  -Hemoglobin has been stable since transfusion, Hgb 10.0  -9.7 - EGD "Multiple non-bleeding angiodysplastic lesions in the stomach. 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 This area was ablated with APC." -Continue PPI once a day -GI Dr. Benson [place] input appreciated   Acute hepatitis A with acute liver failure -Presented with fever, acute elevated LFT -Hepatitis A IgM positive -Improving -GI consulted  Acute metabolic encephalopathy -Present with lethargy, improving, alert, oriented to place and person, though not oriented to time, not sure if this is baseline  Acute on chronic hypoxic respiratory failure , baseline on 2 L, -Required 4-5 L in the hospital -Acute on chronic diastolic CHF with bilateral pleural effusion --Echocardiogram left ventricular EF 55%, Left ventricular diastolic parameters are  consistent with Grade III diastolic dysfunction (restrictive).  Trivial pericardial effusion  - chest x-ray showed"Moderate to large layering bilateral pleural effusions with underlying consolidation."start Lasix drip in the setting of renal impairment, then  changed to IV Lasix 80 mg twice a day , now on oral Iv due to refusing iv placement -wheezing resolved, lung sounds still diminished, repeat cxr in am --Close monitor volume status, I's and O's  PM addendum: Repeat chest x-ray showed persistent moderate bilateral pleural effusion, mild pulmonary interstitial edema.  Patient now agree to have a new IV placed, will start IV Lasix 80 mg every 6 hours for total of 4 doses, then reassess response,  close monitor urine output. Wean oxygen as able ( currently on 6liters, baseline 2liters)  Right arm edema, likely due to iv Will get venous US to rule out dvt Elevate right arm  PM Addendum: venous US showed "chronic superficial vein thrombosis involving the right basilic vein"  AKI  on CKD4 -Urine culture with multiple species none dominant -renal ultrasound "Normal-appearing kidneys without hydronephrosis.  Bladder wall thickening which may be related incomplete distension. A focal septation is noted within the bladder of uncertain significance. Direct visualization may be helpful as necessary. -She denies urinary symptom, fever subsided, will monitor off antibiotics -AKI might have component of cardiorenal syndrome as well -cr improving -Renal dosing meds  Hypokalemia, replaced K, monitor   HTN: Home medication Norvasc hydralazine held since admission BP around 150 ,increase Hydralazine, on lasix, start coreg Continue adjust bp meds    Diet controlled diabetes A.m. blood glucose 100 A1c 6 Continue diet control  PVD status post bilateral femoropopliteal bypasses Continue Lipitor Not on aspirin likely due to GI bleed  Right knee pain status post fall Was released from her SNF after a rehab stay on 4/15  Stage III right patellar ulcer  Body mass index is 20.99 kg/m.     FTT,  therapy eval, skilled nursing facility placement  DVT Prophylaxis: SCDs  Code Status: Full  Family Communication: niece annette at bedside with permission on April 28, niece over the phone 0n 5/2  Disposition Plan:    Patient came from:                     Home                                                                                      Anticipated d/c place:  snf  Barriers to d/c OR conditions which need to be met to effect a safe d/c:  Requiring iv lasix, needs to wean oxygen , not Medically stable to discharge to snf  Consultants:  GI  Procedures:  PRBC transfusion  EGD on April 29  Antibiotics:  None   Objective: BP (!) 144/102 (BP Location: Left Arm)   Pulse 76   Temp 98.7 F (37.1 C) (Axillary)   Resp 17   Ht 5' 7" (1.702 m)   Wt 60.8 kg   SpO2 95%   BMI 20.99 kg/m   Intake/Output Summary (Last 24 hours) at 01/30/2020  0710 Last data filed at 01/30/2020 9629 Gross per 24 hour  Intake 120 ml  Output 950 ml  Net -830 ml   Filed Weights   01/27/20 0400 01/28/20 0342 01/30/20 0336  Weight: 60.2 kg 61.7 kg 60.8 kg    Exam: Patient is examined daily including today on 01/30/2020, exams remain the same as of yesterday except that has changed    General: [name], alert, oriented to place and person, not to time  Cardiovascular: RRR  Respiratory:  Diminished, wheezing resolved, no rales, no rhonchi  Abdomen: Soft/ND/NT, positive BS  Musculoskeletal: right arm edema  Neuro: alert and oriented to place and person, not to time  Data Reviewed: Basic Metabolic Panel: Recent Labs  Lab 01/24/20  1138 01/25/20 0710 01/26/20 0541 01/27/20 0341 01/28/20 0958 01/29/20 0729  NA 141 143 140 139 141  --   K 3.5 3.9 3.0* 3.9 3.8  --   CL 103 109 106 105 105  --   CO2 25 20* _0 --   GLUCOSE 105* 100* 95 97 95  --   BUN 109* 105* 92* 82* 80*  --   CREATININE 2.87* 2.75* 2.52* 2.23* 2.04*  --   CALCIUM 8.7* 8.3* 8.0* 8.3* 8.5*  --   MG  --   --   --  2.0 2.1 2.1   Liver Function Tests: Recent Labs  Lab 01/24/20 1138 01/25/20 0710 01/26/20 0541 01/27/20 0341 01/28/20 0958  AST 1,477* 1,080* 356* 150* 75*  ALT 850* 843* 546* 355* 258*  ALKPHOS 185* 163* 133* 124 120  BILITOT 1.4* 1.3* 1.2 1.1 1.1  PROT 6.9 6.1* 5.9* 5.9* 6.4*  ALBUMIN 3.2* 2.8* 2.5* 2.5* 2.7*   No results for input(s): LIPASE, AMYLASE in the last 168 hours. Recent Labs  Lab 01/24/20 1127  AMMONIA 23   CBC: Recent Labs  Lab 01/23/20 2300 01/24/20 1138 01/24/20 1551 01/25/20 0942 01/26/20 0541 01/27/20 0341 01/28/20 0958  WBC 6.8   < > 7.0 6.4 6.2 6.8 6.6  NEUTROABS 6.0  --   --   --  5.0 5.3 5.2  HGB 7.6*   < > 10.1* 10.3* 9.7* 10.3* 11.6*  HCT 24.8*   < > 31.5* 33.2* 30.6* 32.4* 36.5  MCV 95.4   < > 89.7 92.0 89.7 88.8 89.0  PLT 225   < > 163 150 119* 143* 163   < > = values in this interval not  displayed.   Cardiac Enzymes:   Recent Labs  Lab 01/24/20 1127  CKTOTAL 443*   BNP (last 3 results) Recent Labs    10/26/19 1027 12/23/19 1624 01/23/20 2300  BNP 756.7* 724.7* 2,946.3*    ProBNP (last 3 results) No results for input(s): PROBNP in the last 8760 hours.  CBG: Recent Labs  Lab 01/28/20 0752 01/28/20 1227 01/28/20 1721 01/29/20 0734 01/29/20 1151  GLUCAP 78 97 155* 105* 111*    Recent Results (from the past 240 hour(s))  Respiratory Panel by RT PCR (Flu A&B, Covid) - Nasopharyngeal Swab     Status: None   Collection Time: 01/23/20 11:47 PM   Specimen: Nasopharyngeal Swab  Result Value Ref Range Status   SARS Coronavirus 2 by RT PCR NEGATIVE NEGATIVE Final    Comment: (NOTE) SARS-CoV-2 target nucleic acids are NOT DETECTED. The SARS-CoV-2 RNA is generally detectable in upper respiratoy specimens during the acute phase of infection. The lowest concentration of SARS-CoV-2 viral copies this assay can detect is 131 copies/mL. A negative result does not preclude SARS-Cov-2 infection and should not be used as the sole basis for treatment or other patient management decisions. A negative result may occur with  improper specimen collection/handling, submission of specimen other than nasopharyngeal swab, presence of viral mutation(s) within the areas targeted by this assay, and inadequate number of viral copies (<131 copies/mL). A negative result must be combined with clinical observations, patient history, and epidemiological information. The expected result is Negative. Fact Sheet for Patients:  PinkCheek.be Fact Sheet for Healthcare Providers:  GravelBags.it This test is not yet ap proved or cleared by the [place] FDA and  has been authorized for detection and/or diagnosis of SARS-CoV-2 by FDA under an Emergency Use Authorization (EUA). This EUA will  remain  in effect (meaning this test can  be used) for the duration of the COVID-19 declaration under Section 564(b)(1) of the Act, 21 U.S.C. section 360bbb-3(b)(1), unless the authorization is terminated or revoked sooner.    Influenza A by PCR NEGATIVE NEGATIVE Final   Influenza B by PCR NEGATIVE NEGATIVE Final    Comment: (NOTE) The Xpert Xpress SARS-CoV-2/FLU/RSV assay is intended as an aid in  the diagnosis of influenza from Nasopharyngeal swab specimens and  should not be used as a sole basis for treatment. Nasal washings and  aspirates are unacceptable for Xpert Xpress SARS-CoV-2/FLU/RSV  testing. Fact Sheet for Patients: PinkCheek.be Fact Sheet for Healthcare Providers: GravelBags.it This test is not yet approved or cleared by the [place] FDA and  has been authorized for detection and/or diagnosis of SARS-CoV-2 by  FDA under an Emergency Use Authorization (EUA). This EUA will remain  in effect (meaning this test can be used) for the duration of the  Covid-19 declaration under Section 564(b)(1) of the Act, 21  U.S.C. section 360bbb-3(b)(1), unless the authorization is  terminated or revoked. Performed at [facility], [place] [address] [place], [place] [address]   Urine culture     Status: Abnormal   Collection Time: 01/24/20 11:55 AM   Specimen: Urine, Random  Result Value Ref Range Status   Specimen Description URINE, RANDOM  Final   Special Requests   Final    NONE Performed at [facility], [place] [address] [place], [place] [address]    Culture MULTIPLE SPECIES PRESENT, SUGGEST RECOLLECTION (A)  Final   Report Status 01/25/2020 FINAL  Final  MRSA PCR Screening     Status: None   Collection Time: 01/24/20  2:02 PM   Specimen: Nasopharyngeal  Result Value Ref Range Status   MRSA by PCR NEGATIVE NEGATIVE Final    Comment:        The GeneXpert MRSA Assay (FDA approved for NASAL specimens only), is one component of a comprehensive  MRSA colonization surveillance program. It is not intended to diagnose MRSA infection nor to guide or monitor treatment for MRSA infections. Performed at [facility], [place] [address], [place],  [address]      Studies: No results found.  Scheduled Meds: . carvedilol  3.125 mg Oral BID WC  . furosemide  80 mg Oral BID  . guaiFENesin  600 mg Oral BID  . hydrALAZINE  50 mg Oral Q8H  . insulin aspart  0-9 Units Subcutaneous TID WC  . latanoprost  1 drop Both Eyes QHS  . levothyroxine  112 mcg Oral Daily  . mouth rinse  15 mL Mouth Rinse BID  . Netarsudil Dimesylate  1 drop Both Eyes QHS  . pantoprazole  40 mg Oral Daily    Continuous Infusions:    Time spent: 70mns,  I have personally reviewed and interpreted on  01/30/2020 daily labs, tele strips, imagings as discussed above under date review session and assessment and plans.  I reviewed all nursing notes, pharmacy notes, consultant notes,  vitals, pertinent old records  I have discussed plan of care as described above with RN , patient on 01/30/2020   FFlorencia ReasonsMD, PhD, FACP  [facility]  Available via Epic secure chat 7am-7pm for nonurgent issues Please page for urgent issues, pager number available through [email or web address] .   01/30/2020, 7:10 AM  LOS: 6 days

## 2020-01-30 NOTE — Progress Notes (Addendum)
Right upper extremity venous duplex has been completed. Preliminary results can be found in CV Proc through chart review.   01/30/20 11:16 AM Gina [name] RVT

## 2020-01-31 ENCOUNTER — Inpatient Hospital Stay (HOSPITAL_COMMUNITY): Payer: Medicare Other

## 2020-01-31 LAB — BRAIN NATRIURETIC PEPTIDE: B Natriuretic Peptide: 1852.6 pg/mL — ABNORMAL HIGH (ref 0.0–100.0)

## 2020-01-31 LAB — COMPREHENSIVE METABOLIC PANEL
ALT: 137 U/L — ABNORMAL HIGH (ref 0–44)
AST: 36 U/L (ref 15–41)
Albumin: 2.7 g/dL — ABNORMAL LOW (ref 3.5–5.0)
Alkaline Phosphatase: 97 U/L (ref 38–126)
Anion gap: 10 (ref 5–15)
BUN: 75 mg/dL — ABNORMAL HIGH (ref 8–23)
CO2: 27 mmol/L (ref 22–32)
Calcium: 8.5 mg/dL — ABNORMAL LOW (ref 8.9–10.3)
Chloride: 103 mmol/L (ref 98–111)
Creatinine, Ser: 1.94 mg/dL — ABNORMAL HIGH (ref 0.44–1.00)
GFR calc Af Amer: 26 mL/min — ABNORMAL LOW (ref >60)
GFR calc non Af Amer: 23 mL/min — ABNORMAL LOW (ref >60)
Glucose, Bld: 106 mg/dL — ABNORMAL HIGH (ref 70–99)
Potassium: 4.6 mmol/L (ref 3.5–5.1)
Sodium: 140 mmol/L (ref 135–145)
Total Bilirubin: 0.9 mg/dL (ref 0.3–1.2)
Total Protein: 6.1 g/dL — ABNORMAL LOW (ref 6.5–8.1)

## 2020-01-31 LAB — TSH: TSH: 29.485 u[IU]/mL — ABNORMAL HIGH (ref 0.350–4.500)

## 2020-01-31 LAB — GLUCOSE, CAPILLARY
Glucose-Capillary: 92 mg/dL (ref 70–99)
Glucose-Capillary: 131 mg/dL — ABNORMAL HIGH (ref 70–99)
Glucose-Capillary: 72 mg/dL (ref 70–99)
Glucose-Capillary: 59 mg/dL — ABNORMAL LOW (ref 70–99)
Glucose-Capillary: 126 mg/dL — ABNORMAL HIGH (ref 70–99)
Glucose-Capillary: 163 mg/dL — ABNORMAL HIGH (ref 70–99)

## 2020-01-31 LAB — MAGNESIUM
Magnesium: 2.1 mg/dL (ref 1.7–2.4)
Magnesium: 2.2 mg/dL (ref 1.7–2.4)

## 2020-01-31 LAB — C-REACTIVE PROTEIN: CRP: 2 mg/dL — ABNORMAL HIGH (ref <1.0)

## 2020-01-31 LAB — PROTIME-INR
INR: 1.2 (ref 0.8–1.2)
Prothrombin Time: 14.4 seconds (ref 11.4–15.2)

## 2020-01-31 MED ORDER — FUROSEMIDE 10 MG/ML IJ SOLN
40.0000 mg | Freq: Two times a day (BID) | INTRAMUSCULAR | Status: DC
  Administered 2020-01-31 – 2020-02-03 (×7): 40 mg via INTRAVENOUS
  Filled 2020-01-31 (×7): qty 4

## 2020-01-31 MED ORDER — PRO-STAT SUGAR FREE PO LIQD
30.0000 mL | Freq: Three times a day (TID) | ORAL | Status: DC
  Administered 2020-01-31 – 2020-02-03 (×8): 30 mL via ORAL
  Filled 2020-01-31 (×9): qty 30

## 2020-01-31 MED ORDER — HYDRALAZINE HCL 50 MG PO TABS
100.0000 mg | ORAL_TABLET | Freq: Three times a day (TID) | ORAL | Status: DC
  Administered 2020-01-31 – 2020-02-03 (×10): 100 mg via ORAL
  Filled 2020-01-31 (×11): qty 2

## 2020-01-31 MED ORDER — ACETAMINOPHEN 325 MG PO TABS
650.0000 mg | ORAL_TABLET | Freq: Four times a day (QID) | ORAL | Status: DC | PRN
  Administered 2020-01-31 – 2020-02-03 (×5): 650 mg via ORAL
  Filled 2020-01-31 (×5): qty 2

## 2020-01-31 NOTE — Progress Notes (Signed)
PROGRESS NOTE                                                                                                                                                                                                             Patient Demographics:    Gina [name], is a 86 y.o. female, DOB - 1928/11/29, AOZ:[id number]  Outpatient Primary MD for the patient is [name], NP    LOS - 7  Admit date - 01/23/2020    Chief Complaint  Patient presents with  . Fever       Brief Narrative  86 year old with a history of gastric ulcer/AV malformations, PVD status post bilateral femoropopliteal bypasses, chronic hypoxic respiratory failure on 2 L,HTN, HLD, DM, CKD stage IV, mesenteric ischemia, Graves' disease, and diastolic congestive heart failure who presented to the ED complaining of weakness, she was recently [place] on 12/28/19 for Symptomatic Anemia during that admission she received some blood transfusions and had EGD showing multiple angiodysplastic lesions in her gastric fundus.  Repeat EGD was also planned that admission in a few weeks.  This admission she was found to have evidence of CHF + acute hepatitis A for which GI is still following her.   Subjective:    Gina [name] today has, No headache, No chest pain, No abdominal pain - No Nausea, No new weakness tingling or numbness, no shortness of breath but having pain in both legs says she is having this pain for several weeks.   Assessment  & Plan :     1. Recurrent acute on chronic symptomatic anemia, history of UGI AVMs - post EGD x 2 and 3 units of packed RBC transfusion total between this and previous admission.  Case discussed with Dr. Carlota Raspberry on 01/31/2020.  Continue PPI monitor H&H with outpatient follow-up with Dr. Benson Norway 1 month after discharge.  2.  Acute hepatitis A.  Due to food contamination, supportive care self resolving improved.  INR stable suggesting  stable synthetic function.  Currently no signs of acute liver failure.   3.  Acute metabolic encephalopathy.  Supportive care.  Minimize narcotics and benzodiazepines.  4.  AKI on CKD 4.  Baseline creatinine around 1.8, improving with diuresis monitor.  5.  Hypertension.  Currently on Norvasc, Coreg and hydralazine.  Hydralazine dose increased for better control.  6.  Frail  status, protein calorie malnutrition.  Added prostat, DNR now discussed with son, PT OT May require placement.  7.  Hypothyroidism.  Continue home dose Synthroid.  Check baseline TSH.  8.  Right arm chronic superficial thrombophlebitis.  Supportive care.  Avoid IV access in the right arm.  9.  Acute on chronic diastolic CHF EF 57%.  Diurese, beta-blocker and monitor electrolytes.    Condition - Extremely Guarded  Family Communication  :   Annie Sable [phone] on 01/31/20  Niece [name] -  [phone] on 01/31/20   Code Status :  DNR  Diet :   Diet Order            Diet regular Room service appropriate? No; Fluid consistency: Thin  Diet effective now               Disposition Plan  :   Status is: Inpatient  Remains inpatient appropriate because:Inpatient level of care appropriate due to severity of illness   Dispo: The patient is from: ALF              Anticipated d/c is to: ALF              Anticipated d/c date is: 3 days              Patient currently is not medically stable to d/c.  Consults  :  GI  Procedures  :     PRBC transfusion  EGD on April 29  TTE  1. Left ventricular ejection fraction, by estimation, is 55%. The left ventricle has normal function. The left ventricle has no regional wall motion abnormalities. There is mild left ventricular hypertrophy. Left ventricular diastolic parameters are consistent with Grade III diastolic dysfunction (restrictive). Elevated  left ventricular end-diastolic pressure.  2. Right ventricular systolic function is normal. The right  ventricular size is moderately enlarged. There is mildly elevated pulmonary artery systolic pressure.  3. Left atrial size was severely dilated.  4. Right atrial size was moderately dilated.  5. Large pleural effusion.  6. The mitral valve is normal in structure. Mild mitral valve regurgitation. No evidence of mitral stenosis.  7. Tricuspid valve regurgitation is mild to moderate.  8. The aortic valve is tricuspid. Aortic valve regurgitation is not visualized. No aortic stenosis is present.  9. The inferior vena cava is dilated in size with <50% respiratory variability, suggesting right atrial pressure of 15 mmHg.    PUD Prophylaxis : PPI  DVT Prophylaxis  :    SCDs    Lab Results  Component Value Date   PLT 170 01/30/2020    Inpatient Medications  Scheduled Meds: . carvedilol  3.125 mg Oral BID WC  . guaiFENesin  600 mg Oral BID  . hydrALAZINE  50 mg Oral Q8H  . insulin aspart  0-9 Units Subcutaneous TID WC  . latanoprost  1 drop Both Eyes QHS  . levothyroxine  112 mcg Oral Daily  . mouth rinse  15 mL Mouth Rinse BID  . Netarsudil Dimesylate  1 drop Both Eyes QHS  . pantoprazole  40 mg Oral Daily  . senna-docusate  1 tablet Oral BID   Continuous Infusions: PRN Meds:.albuterol, oxyCODONE  Antibiotics  :    Anti-infectives (From admission, onward)   None       Time Spent in minutes  30   Lala Lund M.D on 01/31/2020 at 11:07 AM  To page go to [email or web address] - password [email or web address]  [facility] -  Office  [phone]  See all Orders from today for further details    Objective:   Vitals:   01/30/20 1300 01/30/20 2134 01/30/20 2242 01/31/20 0546  BP: (!) 135/48 (!) 149/58 136/88 (!) 155/47  Pulse: 63 (!) 57    Resp: 16 16    Temp: 97.8 F (36.6 C) 98.1 F (36.7 C)    TempSrc: Axillary Oral    SpO2: 96% 99%    Weight:  58.4 kg    Height:        Wt Readings from Last 3 Encounters:  01/30/20 58.4 kg  12/27/19 49 kg  11/24/19 48.1 kg      Intake/Output Summary (Last 24 hours) at 01/31/2020 1107 Last data filed at 01/31/2020 0610 Gross per 24 hour  Intake 240 ml  Output 850 ml  Net -610 ml     Physical Exam  Awake Alert, No new F.N deficits, Normal affect [place].AT,PERRAL Supple Neck,No JVD, No cervical lymphadenopathy appriciated.  Symmetrical Chest wall movement, Good air movement bilaterally, CTAB RRR,No Gallops,Rubs or new Murmurs, No Parasternal Heave +ve B.Sounds, Abd Soft, No tenderness, No organomegaly appriciated, No rebound - guarding or rigidity. No Cyanosis, Clubbing or edema, No new Rash or bruise      Data Review:    CBC Recent Labs  Lab 01/25/20 0942 01/26/20 0541 01/27/20 0341 01/28/20 0958 01/30/20 0755  WBC 6.4 6.2 6.8 6.6 6.1  HGB 10.3* 9.7* 10.3* 11.6* 11.3*  HCT 33.2* 30.6* 32.4* 36.5 36.2  PLT 150 119* 143* 163 170  MCV 92.0 89.7 88.8 89.0 90.0  MCH 28.5 28.4 28.2 28.3 28.1  MCHC 31.0 31.7 31.8 31.8 31.2  RDW 16.9* 16.4* 16.2* 16.6* 16.3*  LYMPHSABS  --  0.5* 0.7 0.8  --   MONOABS  --  0.5 0.6 0.5  --   EOSABS  --  0.2 0.1 0.1  --   BASOSABS  --  0.0 0.0 0.0  --     Chemistries  Recent Labs  Lab 01/24/20 1127 01/24/20 1138 01/26/20 0541 01/26/20 0541 01/27/20 0341 01/28/20 0958 01/29/20 0729 01/30/20 0755 01/31/20 0450 01/31/20 0922  NA  --    < > 140  --  139 141  --  140 140  --   K  --    < > 3.0*  --  3.9 3.8  --  3.7 4.6  --   CL  --    < > 106  --  105 105  --  104 103  --   CO2  --    < > 24  --  26 27  --  26 27  --   GLUCOSE  --    < > 95  --  97 95  --  103* 106*  --   BUN  --    < > 92*  --  82* 80*  --  74* 75*  --   CREATININE  --    < > 2.52*  --  2.23* 2.04*  --  1.81* 1.94*  --   CALCIUM  --    < > 8.0*  --  8.3* 8.5*  --  8.8* 8.5*  --   AST  --    < > 356*  --  150* 75*  --  45* 36  --   ALT  --    < > 546*  --  355* 258*  --  168* 137*  --   ALKPHOS  --    < >  133*  --  124 120  --  104 97  --   BILITOT  --    < > 1.2  --  1.1 1.1  --  1.0 0.9   --   MG  --   --   --   --  2.0 2.1 2.1  --  2.1 2.2  AMMONIA 23  --   --   --   --   --   --   --   --   --   INR  --    < > 1.4*   < > 1.3* 1.2 1.2 1.1 1.2  --   HGBA1C 6.0*  --   --   --   --   --   --   --   --   --    < > = values in this interval not displayed.     ------------------------------------------------------------------------------------------------------------------ No results for input(s): CHOL, HDL, LDLCALC, TRIG, CHOLHDL, LDLDIRECT in the last 72 hours.  Lab Results  Component Value Date   HGBA1C 6.0 (H) 01/24/2020   ------------------------------------------------------------------------------------------------------------------ No results for input(s): TSH, T4TOTAL, T3FREE, THYROIDAB in the last 72 hours.  Invalid input(s): FREET3  Cardiac Enzymes No results for input(s): CKMB, TROPONINI, MYOGLOBIN in the last 168 hours.  Invalid input(s): CK ------------------------------------------------------------------------------------------------------------------    Component Value Date/Time   BNP 1,852.6 (H) 01/31/2020 9381    Micro Results Recent Results (from the past 240 hour(s))  Respiratory Panel by RT PCR (Flu A&B, Covid) - Nasopharyngeal Swab     Status: None   Collection Time: 01/23/20 11:47 PM   Specimen: Nasopharyngeal Swab  Result Value Ref Range Status   SARS Coronavirus 2 by RT PCR NEGATIVE NEGATIVE Final    Comment: (NOTE) SARS-CoV-2 target nucleic acids are NOT DETECTED. The SARS-CoV-2 RNA is generally detectable in upper respiratoy specimens during the acute phase of infection. The lowest concentration of SARS-CoV-2 viral copies this assay can detect is 131 copies/mL. A negative result does not preclude SARS-Cov-2 infection and should not be used as the sole basis for treatment or other patient management decisions. A negative result may occur with  improper specimen collection/handling, submission of specimen other than nasopharyngeal  swab, presence of viral mutation(s) within the areas targeted by this assay, and inadequate number of viral copies (<131 copies/mL). A negative result must be combined with clinical observations, patient history, and epidemiological information. The expected result is Negative. Fact Sheet for Patients:  PinkCheek.be Fact Sheet for Healthcare Providers:  GravelBags.it This test is not yet ap proved or cleared by the [place] FDA and  has been authorized for detection and/or diagnosis of SARS-CoV-2 by FDA under an Emergency Use Authorization (EUA). This EUA will remain  in effect (meaning this test can be used) for the duration of the COVID-19 declaration under Section 564(b)(1) of the Act, 21 U.S.C. section 360bbb-3(b)(1), unless the authorization is terminated or revoked sooner.    Influenza A by PCR NEGATIVE NEGATIVE Final   Influenza B by PCR NEGATIVE NEGATIVE Final    Comment: (NOTE) The Xpert Xpress SARS-CoV-2/FLU/RSV assay is intended as an aid in  the diagnosis of influenza from Nasopharyngeal swab specimens and  should not be used as a sole basis for treatment. Nasal washings and  aspirates are unacceptable for Xpert Xpress SARS-CoV-2/FLU/RSV  testing. Fact Sheet for Patients: PinkCheek.be Fact Sheet for Healthcare Providers: GravelBags.it This test is not yet approved or cleared by the [place] FDA and  has  been authorized for detection and/or diagnosis of SARS-CoV-2 by  FDA under an Emergency Use Authorization (EUA). This EUA will remain  in effect (meaning this test can be used) for the duration of the  Covid-19 declaration under Section 564(b)(1) of the Act, 21  U.S.C. section 360bbb-3(b)(1), unless the authorization is  terminated or revoked. Performed at [facility], [place] [address], [place], [place] [address]   Urine culture     Status:  Abnormal   Collection Time: 01/24/20 11:55 AM   Specimen: Urine, Random  Result Value Ref Range Status   Specimen Description URINE, RANDOM  Final   Special Requests   Final    NONE Performed at [facility], [place] [address], [place], [place] [address]    Culture MULTIPLE SPECIES PRESENT, SUGGEST RECOLLECTION (A)  Final   Report Status 01/25/2020 FINAL  Final  MRSA PCR Screening     Status: None   Collection Time: 01/24/20  2:02 PM   Specimen: Nasopharyngeal  Result Value Ref Range Status   MRSA by PCR NEGATIVE NEGATIVE Final    Comment:        The GeneXpert MRSA Assay (FDA approved for NASAL specimens only), is one component of a comprehensive MRSA colonization surveillance program. It is not intended to diagnose MRSA infection nor to guide or monitor treatment for MRSA infections. Performed at [facility], [place] [address] [place],  [address]     Radiology Reports DG Chest 2 View  Result Date: 01/30/2020 CLINICAL DATA:  Pleural effusions, hypoxia, fever and CHF. EXAM: CHEST - 2 VIEW COMPARISON:  01/26/2020 FINDINGS: Stable calcified thoracic aorta. Lung volumes are lower compared to the prior study. Relatively stable moderate-sized bilateral pleural effusions. Suggestion of mild pulmonary interstitial edema. No pneumothorax. IMPRESSION: Stable moderate bilateral pleural effusions. Suggestion of mild pulmonary interstitial edema. Electronically Signed   By: Aletta Edouard M.D.   On: 01/30/2020 14:39   DG Knee 1-2 Views Right  Result Date: 01/24/2020 CLINICAL DATA:  Fall EXAM: RIGHT KNEE - 1-2 VIEW COMPARISON:  None. FINDINGS: No evidence of fracture, dislocation, or joint effusion. There is diffuse osteopenia. Dense vascular calcifications are seen. Surgical clips are seen posteriorly. Soft tissue swelling seen surrounding the knee. IMPRESSION: No acute osseous abnormality. Diffuse soft tissue swelling surrounding the knee. Electronically Signed   By: Prudencio Pair M.D.   On: 01/24/2020 01:35   US RENAL  Result Date: 01/25/2020 CLINICAL DATA:  Elevated creatinine EXAM: RENAL / URINARY TRACT ULTRASOUND COMPLETE COMPARISON:  01/15/2019 FINDINGS: Right Kidney: Renal measurements: 10.2 x 4.5 x 4.9 cm. = volume: 118 mL . Echogenicity within normal limits. No mass or hydronephrosis visualized. Left Kidney: Renal measurements: 8.4 x 4.7 x 5.0 cm. = volume: 103 mL. Kidney is only partially visualized. No mass lesion or hydronephrosis is noted. Bladder: Bladder wall is thickened with internal septations. Other: None. IMPRESSION: Normal-appearing kidneys without hydronephrosis. Bladder wall thickening which may be related incomplete distension. A focal septation is noted within the bladder of uncertain significance. Direct visualization may be helpful as necessary. Electronically Signed   By: Inez Catalina M.D.   On: 01/25/2020 22:14   DG Chest Port 1 View  Result Date: 01/31/2020 CLINICAL DATA:  Short of breath. EXAM: PORTABLE CHEST 1 VIEW COMPARISON:  01/30/2020 FINDINGS: Previous median sternotomy and CABG procedure. Moderate bilateral pleural effusions are again noted. Mild pulmonary interstitial edema. No pneumothorax. IMPRESSION: Persistent bilateral pleural effusions and mild pulmonary interstitial edema. Electronically  Signed   By: Kerby Moors M.D.   On: 01/31/2020 09:45   DG CHEST PORT 1 VIEW  Result Date: 01/26/2020 CLINICAL DATA:  Patient with fever and confusion. EXAM: PORTABLE CHEST 1 VIEW COMPARISON:  Chest radiograph 01/25/2020. FINDINGS: Monitoring leads overlie the patient. Stable scattered cardiac and mediastinal contours. Aortic atherosclerosis. Moderate to large layering bilateral pleural effusions with underlying consolidation. Similar bilateral interstitial opacities. No pneumothorax. IMPRESSION: Moderate to large layering bilateral pleural effusions with underlying consolidation. Electronically Signed   By: Lovey Newcomer M.D.   On: 01/26/2020  17:44   DG Chest Port 1 View  Result Date: 01/25/2020 CLINICAL DATA:  Pleural effusion. EXAM: PORTABLE CHEST 1 VIEW COMPARISON:  January 24, 2020. FINDINGS: Stable cardiomediastinal silhouette. Atherosclerosis of thoracic aorta is noted. Stable bilateral pleural effusions are noted, with associated bilateral pulmonary edema or atelectasis. Bony thorax is unremarkable. IMPRESSION: Stable bilateral pleural effusions are noted with associated bilateral pulmonary edema or atelectasis. Aortic Atherosclerosis (ICD10-I70.0). Electronically Signed   By: Marijo Conception M.D.   On: 01/25/2020 09:18   DG Chest Portable 1 View  Result Date: 01/24/2020 CLINICAL DATA:  Patient found on the floor. EXAM: PORTABLE CHEST 1 VIEW COMPARISON:  December 23, 2019 FINDINGS: Mild to moderate severity areas of atelectasis and/or infiltrate are seen within the bilateral lung bases. This represents a new finding when compared to the prior study. Moderate size bilateral pleural effusions are seen with a moderate amount of fluid noted within the minor fissure. This is increased in severity when compared to the prior exam. The cardiac silhouette is mildly enlarged and stable in size. Marked severity calcification of the aortic arch is seen. Multilevel degenerative changes seen throughout the thoracic spine. IMPRESSION: 1. Mild to moderate severity bibasilar atelectasis and/or infiltrate. 2. Moderate size bilateral pleural effusions with a moderate amount of fluid within the minor fissure. Electronically Signed   By: Virgina Norfolk M.D.   On: 01/24/2020 01:13   ECHOCARDIOGRAM COMPLETE  Result Date: 01/24/2020    ECHOCARDIOGRAM REPORT   Patient Name:   GABRIANA WILMOTT Primm Date of Exam: 01/24/2020 Medical Rec #:  [id number]        Height:       67.0 in Accession #:    [id number]       Weight:       116.0 lb Date of Birth:  1929-09-11        BSA:          1.604 m Patient Age:    89 years         BP:           128/51 mmHg Patient Gender: F                 HR:           62 bpm. Exam Location:  Inpatient Procedure: 2D Echo, Cardiac Doppler and Color Doppler Indications:    I50.33 Acute on chronic diastolic (congestive) heart failure  History:        Patient has prior history of Echocardiogram examinations, most                 recent 09/16/2019. Risk Factors:Hypertension, Diabetes and                 Dyslipidemia. Thyroid Disease.  Sonographer:    Tiffany Dance Referring Phys: [id number] [place]  1. Left ventricular ejection fraction, by estimation, is 55%. The left ventricle has normal function.  The left ventricle has no regional wall motion abnormalities. There is mild left ventricular hypertrophy. Left ventricular diastolic parameters are consistent with Grade III diastolic dysfunction (restrictive). Elevated left ventricular end-diastolic pressure.  2. Right ventricular systolic function is normal. The right ventricular size is moderately enlarged. There is mildly elevated pulmonary artery systolic pressure.  3. Left atrial size was severely dilated.  4. Right atrial size was moderately dilated.  5. Large pleural effusion.  6. The mitral valve is normal in structure. Mild mitral valve regurgitation. No evidence of mitral stenosis.  7. Tricuspid valve regurgitation is mild to moderate.  8. The aortic valve is tricuspid. Aortic valve regurgitation is not visualized. No aortic stenosis is present.  9. The inferior vena cava is dilated in size with <50% respiratory variability, suggesting right atrial pressure of 15 mmHg. Comparison(s): A prior study was performed on 09/16/2019. Prior images reviewed side by side. RV/RA size has increased, TR has increased, RA pressure has increased. Similar appearance of large pleural effusion. FINDINGS  Left Ventricle: Left ventricular ejection fraction, by estimation, is 55%. The left ventricle has normal function. The left ventricle has no regional wall motion abnormalities. The left ventricular internal cavity  size was normal in size. There is mild left ventricular hypertrophy. Left ventricular diastolic parameters are consistent with Grade III diastolic dysfunction (restrictive). Elevated left ventricular end-diastolic pressure. Right Ventricle: The right ventricular size is moderately enlarged. No increase in right ventricular wall thickness. Right ventricular systolic function is normal. There is mildly elevated pulmonary artery systolic pressure. The tricuspid regurgitant velocity is 2.62 m/s, and with an assumed right atrial pressure of 15 mmHg, the estimated right ventricular systolic pressure is 61.6 mmHg. Left Atrium: Left atrial size was severely dilated. Right Atrium: Right atrial size was moderately dilated. Pericardium: Trivial pericardial effusion is present. Mitral Valve: The mitral valve is normal in structure. Normal mobility of the mitral valve leaflets. Mild mitral valve regurgitation. No evidence of mitral valve stenosis. Tricuspid Valve: The tricuspid valve is normal in structure. Tricuspid valve regurgitation is mild to moderate. No evidence of tricuspid stenosis. Aortic Valve: The aortic valve is tricuspid. Aortic valve regurgitation is not visualized. No aortic stenosis is present. Pulmonic Valve: The pulmonic valve was normal in structure. Pulmonic valve regurgitation is trivial. No evidence of pulmonic stenosis. Aorta: The aortic root is normal in size and structure. Venous: The inferior vena cava is dilated in size with less than 50% respiratory variability, suggesting right atrial pressure of 15 mmHg. IAS/Shunts: No atrial level shunt detected by color flow Doppler. Additional Comments: There is a large pleural effusion.  LEFT VENTRICLE PLAX 2D LVIDd:         4.90 cm  Diastology LVIDs:         3.35 cm  LV e' lateral:   5.87 cm/s LV PW:         0.88 cm  LV E/e' lateral: 14.1 LV IVS:        0.75 cm  LV e' medial:    2.98 cm/s LVOT diam:     2.00 cm  LV E/e' medial:  27.8 LV SV:         58 LV SV  Index:   36 LVOT Area:     3.14 cm  RIGHT VENTRICLE            IVC RV Basal diam:  2.70 cm    IVC diam: 2.69 cm RV S prime:     9.20 cm/s TAPSE (M-mode): 1.3  cm LEFT ATRIUM             Index       RIGHT ATRIUM           Index LA diam:        4.00 cm 2.49 cm/m  RA Area:     18.00 cm LA Vol (A2C):   96.8 ml 60.34 ml/m RA Volume:   51.50 ml  32.10 ml/m LA Vol (A4C):   56.2 ml 35.03 ml/m LA Biplane Vol: 74.1 ml 46.19 ml/m  AORTIC VALVE LVOT Vmax:   73.45 cm/s LVOT Vmean:  48.000 cm/s LVOT VTI:    0.186 m  AORTA Ao Root diam: 3.30 cm Ao Asc diam:  2.70 cm MITRAL VALVE               TRICUSPID VALVE MV Area (PHT): 4.80 cm    TR Peak grad:   27.5 mmHg MV Decel Time: 158 msec    TR Vmax:        262.00 cm/s MV E velocity: 82.90 cm/s MV A velocity: 42.20 cm/s  SHUNTS MV E/A ratio:  1.96        Systemic VTI:  0.19 m                            Systemic Diam: 2.00 cm Cherlynn Kaiser MD Electronically signed by Cherlynn Kaiser MD Signature Date/Time: 01/24/2020/5:36:14 PM    Final    VAS [place] UPPER EXTREMITY VENOUS DUPLEX  Result Date: 01/30/2020 UPPER VENOUS STUDY  Indications: Swelling Risk Factors: None identified. Limitations: Body habitus and patient positioning. Comparison Study: No prior studies. Performing Technologist: Oliver Hum RVT  Examination Guidelines: A complete evaluation includes B-mode imaging, spectral Doppler, color Doppler, and power Doppler as needed of all accessible portions of each vessel. Bilateral testing is considered an integral part of a complete examination. Limited examinations for reoccurring indications may be performed as noted.  Right Findings: +----------+------------+---------+-----------+----------+-------+ RIGHT     CompressiblePhasicitySpontaneousPropertiesSummary +----------+------------+---------+-----------+----------+-------+ IJV           Full       Yes       Yes                      +----------+------------+---------+-----------+----------+-------+  Subclavian    Full       Yes       Yes                      +----------+------------+---------+-----------+----------+-------+ Axillary      Full       Yes       Yes                      +----------+------------+---------+-----------+----------+-------+ Brachial      Full       Yes       Yes                      +----------+------------+---------+-----------+----------+-------+ Radial        Full                                          +----------+------------+---------+-----------+----------+-------+ Ulnar         Full                                          +----------+------------+---------+-----------+----------+-------+  Cephalic      Full                                          +----------+------------+---------+-----------+----------+-------+ Basilic       None                                  Chronic +----------+------------+---------+-----------+----------+-------+  Left Findings: +----------+------------+---------+-----------+----------+-------+ LEFT      CompressiblePhasicitySpontaneousPropertiesSummary +----------+------------+---------+-----------+----------+-------+ Subclavian    Full       Yes       Yes                      +----------+------------+---------+-----------+----------+-------+  Summary:  Right: No evidence of deep vein thrombosis in the upper extremity. Findings consistent with chronic superficial vein thrombosis involving the right basilic vein.  Left: No evidence of thrombosis in the subclavian.  *See table(s) above for measurements and observations.  Diagnosing physician: Monica Martinez MD Electronically signed by Monica Martinez MD on 01/30/2020 at 1:10:17 PM.    Final    US Abdomen Limited RUQ  Result Date: 01/24/2020 CLINICAL DATA:  Elevated bilirubin. EXAM: ULTRASOUND ABDOMEN LIMITED RIGHT UPPER QUADRANT COMPARISON:  None. FINDINGS: Gallbladder: Multiple the folds are seen within the gallbladder wall. Floating  echogenic debris is seen within the gallbladder lumen. No gallstones or wall thickening visualized (1.8 mm). No sonographic Murphy sign noted by sonographer. Common bile duct: Diameter: 3.2 mm Liver: No focal lesion identified. Diffusely increased echogenicity of the liver parenchyma is seen. Portal vein is patent on color Doppler imaging with normal direction of blood flow towards the liver. Other: A small right pleural effusion is suspected. IMPRESSION: 1. Multiple gallbladder folds with gallbladder sludge. 2. No evidence of cholelithiasis or acute cholecystitis. 3. Fatty liver. 4. Small right pleural effusion. Electronically Signed   By: Virgina Norfolk M.D.   On: 01/24/2020 02:28

## 2020-01-31 NOTE — TOC Progression Note (Signed)
Transition of Care [facility]) - Progression Note    Patient Details  Name: Gina [name] MRN: [id number] Date of Birth: 07-05-1929  Transition of Care [facility]) CM/SW [place], LCSW Phone Number: 01/31/2020, 8:56 AM  Clinical Narrative:    CSW contacted [facility] to see if they will be able to accept patient; awaiting response.    Expected Discharge Plan: Skilled Nursing Facility Barriers to Discharge: Continued Medical Work up, Ship broker  Expected Discharge Plan and Services Expected Discharge Plan: [place] In-house Referral: Clinical Social Work   Post Acute Care Choice: [place] Living arrangements for the past 2 months: [place], Apartment                                       Social Determinants of Health (SDOH) Interventions    Readmission Risk Interventions Readmission Risk Prevention Plan 01/27/2020 04/20/2019 04/15/2019  Transportation Screening Complete Complete Complete  PCP or Specialist Appt within 3-5 Days - Complete -  HRI or [place] - Complete -  Social Work Consult for [place] Planning/Counseling - Complete -  Palliative Care Screening - Not Applicable -  Medication Review Press photographer) Complete Complete -  PCP or Specialist appointment within 3-5 days of discharge Complete - -  [place] or Home Care Consult Complete - Complete  SW Recovery Care/Counseling Consult Complete - -  Palliative Care Screening Not Applicable - Not Applicable  Skilled Nursing Facility Complete - Not Applicable  Some recent data might be hidden

## 2020-01-31 NOTE — Progress Notes (Signed)
Hypoglycemic Event  CBG: 59  Treatment: Given 240 cc coke.  Symptoms: None.  Follow-up CBG: Time: 1757 CBG Result: 126  Possible Reasons for Event: Poor PO intake today.   Comments/MD notified: N/A    Gina [name]

## 2020-01-31 NOTE — Progress Notes (Signed)
PT Cancellation Note  Patient Details Name: Gina [name] MRN: [id number] DOB: May 16, 1929   Cancelled Treatment:    Reason Eval/Treat Not Completed: Other (comment) Pt very lethargic and not able to participate with therapy.  Grimaced with sternal nudge but then back asleep.  Did state "not good" when asked how she felt.  Nurse tech present and reports pt has been lethargic all day.  Will f/u as able. Maggie Font, PT Acute Rehab Services Pager [phone] [facility] [phone]   Karlton Lemon 01/31/2020, 2:18 PM

## 2020-02-01 LAB — PROTIME-INR
INR: 1.2 (ref 0.8–1.2)
Prothrombin Time: 14.4 seconds (ref 11.4–15.2)

## 2020-02-01 LAB — C-REACTIVE PROTEIN: CRP: 2.3 mg/dL — ABNORMAL HIGH (ref <1.0)

## 2020-02-01 LAB — CBC WITH DIFFERENTIAL/PLATELET
Abs Immature Granulocytes: 0.03 10*3/uL (ref 0.00–0.07)
Basophils Absolute: 0 10*3/uL (ref 0.0–0.1)
Basophils Relative: 0 %
Eosinophils Absolute: 0.1 10*3/uL (ref 0.0–0.5)
Eosinophils Relative: 1 %
HCT: 34.2 % — ABNORMAL LOW (ref 36.0–46.0)
Hemoglobin: 10.7 g/dL — ABNORMAL LOW (ref 12.0–15.0)
Immature Granulocytes: 0 %
Lymphocytes Relative: 11 %
Lymphs Abs: 0.8 10*3/uL (ref 0.7–4.0)
MCH: 28.2 pg (ref 26.0–34.0)
MCHC: 31.3 g/dL (ref 30.0–36.0)
MCV: 90 fL (ref 80.0–100.0)
Monocytes Absolute: 0.6 10*3/uL (ref 0.1–1.0)
Monocytes Relative: 8 %
Neutro Abs: 5.8 10*3/uL (ref 1.7–7.7)
Neutrophils Relative %: 80 %
Platelets: 197 10*3/uL (ref 150–400)
RBC: 3.8 MIL/uL — ABNORMAL LOW (ref 3.87–5.11)
RDW: 16.3 % — ABNORMAL HIGH (ref 11.5–15.5)
WBC: 7.3 10*3/uL (ref 4.0–10.5)
nRBC: 0 % (ref 0.0–0.2)

## 2020-02-01 LAB — COMPREHENSIVE METABOLIC PANEL
ALT: 101 U/L — ABNORMAL HIGH (ref 0–44)
AST: 31 U/L (ref 15–41)
Albumin: 2.5 g/dL — ABNORMAL LOW (ref 3.5–5.0)
Alkaline Phosphatase: 90 U/L (ref 38–126)
Anion gap: 7 (ref 5–15)
BUN: 77 mg/dL — ABNORMAL HIGH (ref 8–23)
CO2: 27 mmol/L (ref 22–32)
Calcium: 8.2 mg/dL — ABNORMAL LOW (ref 8.9–10.3)
Chloride: 102 mmol/L (ref 98–111)
Creatinine, Ser: 1.89 mg/dL — ABNORMAL HIGH (ref 0.44–1.00)
GFR calc Af Amer: 27 mL/min — ABNORMAL LOW (ref >60)
GFR calc non Af Amer: 23 mL/min — ABNORMAL LOW (ref >60)
Glucose, Bld: 104 mg/dL — ABNORMAL HIGH (ref 70–99)
Potassium: 3.7 mmol/L (ref 3.5–5.1)
Sodium: 136 mmol/L (ref 135–145)
Total Bilirubin: 0.6 mg/dL (ref 0.3–1.2)
Total Protein: 5.8 g/dL — ABNORMAL LOW (ref 6.5–8.1)

## 2020-02-01 LAB — SARS CORONAVIRUS 2 (TAT 6-24 HRS): SARS Coronavirus 2: NEGATIVE

## 2020-02-01 LAB — GLUCOSE, CAPILLARY
Glucose-Capillary: 96 mg/dL (ref 70–99)
Glucose-Capillary: 171 mg/dL — ABNORMAL HIGH (ref 70–99)
Glucose-Capillary: 108 mg/dL — ABNORMAL HIGH (ref 70–99)
Glucose-Capillary: 161 mg/dL — ABNORMAL HIGH (ref 70–99)

## 2020-02-01 LAB — T4, FREE: Free T4: 1.02 ng/dL (ref 0.61–1.12)

## 2020-02-01 LAB — TSH: TSH: 30.14 u[IU]/mL — ABNORMAL HIGH (ref 0.350–4.500)

## 2020-02-01 MED ORDER — LEVOTHYROXINE SODIUM 75 MCG PO TABS
150.0000 ug | ORAL_TABLET | Freq: Every day | ORAL | Status: DC
  Administered 2020-02-02 – 2020-02-03 (×2): 150 ug via ORAL
  Filled 2020-02-01 (×2): qty 2

## 2020-02-01 NOTE — TOC Progression Note (Signed)
Transition of Care [facility]) - Progression Note    Patient Details  Name: Gina [name] MRN: [id number] Date of Birth: 04-16-1929  Transition of Care [facility]) CM/SW [place], LCSW Phone Number: 02/01/2020, 11:39 AM  Clinical Narrative:    [facility] able to accept patient; requested COVID test from MD. CSW will send clinicals to insurance pending PT evaluation today.    Expected Discharge Plan: Skilled Nursing Facility Barriers to Discharge: Continued Medical Work up, Ship broker  Expected Discharge Plan and Services Expected Discharge Plan: [place] In-house Referral: Clinical Social Work   Post Acute Care Choice: [place] Living arrangements for the past 2 months: [place], Apartment                                       Social Determinants of Health (SDOH) Interventions    Readmission Risk Interventions Readmission Risk Prevention Plan 01/27/2020 04/20/2019 04/15/2019  Transportation Screening Complete Complete Complete  PCP or Specialist Appt within 3-5 Days - Complete -  HRI or [place] - Complete -  Social Work Consult for [place] Planning/Counseling - Complete -  Palliative Care Screening - Not Applicable -  Medication Review Press photographer) Complete Complete -  PCP or Specialist appointment within 3-5 days of discharge Complete - -  [place] or Home Care Consult Complete - Complete  SW Recovery Care/Counseling Consult Complete - -  Palliative Care Screening Not Applicable - Not Applicable  Skilled Nursing Facility Complete - Not Applicable  Some recent data might be hidden

## 2020-02-01 NOTE — Progress Notes (Signed)
Physical Therapy Treatment Patient Details Name: Gina [name] MRN: [id number] DOB: December 19, 1928 Today's Date: 02/01/2020    History of Present Illness 86 year old female who sustained another fall at home after discharge from rehab on 4/15, now in acute respiratory failure, acute Hep A with liver failure, acute metabolic encephalopathy, transaminase.  Patient with AKI, FTT, CHF. PMH:  CHF, DM, HTN, PVD, HLD, CKD IIIb, anemia, breast CA s/p mastectomy,  multiple large angiodysplastic lesions with bleeding in gastric fundus/body , angiodysplastic lesion without bleeding in duodenum and jejunum.     PT Comments    Patient required education and encouragement for transfer OOB to chair due to leg pain. Patient progressed with her transfer compared to last PT session and was able to transfer with mod/maxA with RW bed>chair with bed height increased. Continued recommendation for discharge to SNF for short term rehabilitation.  BP in recliner chair: 139/45   Follow Up Recommendations  SNF;Supervision/Assistance - 24 hour     Equipment Recommendations  Rolling walker with 5" wheels;3in1 (PT);Wheelchair (measurements PT);Wheelchair cushion (measurements PT);Hospital bed       Precautions / Restrictions Precautions Precautions: Fall Restrictions Weight Bearing Restrictions: No    Mobility  Bed Mobility Overal bed mobility: Needs Assistance Bed Mobility: Supine to Sit     Supine to sit: Max assist;HOB elevated     General bed mobility comments: Encouraged use of bedrail  Transfers Overall transfer level: Needs assistance Equipment used: Rolling walker (2 wheeled) Transfers: Stand Pivot Transfers   Stand pivot transfers: Mod assist;From elevated surface;Max assist  General transfer comment: stand-step transfer EOB>chair with mod/maxA x 1 with RW and cues for technique, bed height increased  Ambulation/Gait  General Gait Details: deferred due to leg pain    Balance Overall  balance assessment: Needs assistance Sitting-balance support: Feet supported Sitting balance-Leahy Scale: Fair     Standing balance support: Bilateral upper extremity supported Standing balance-Leahy Scale: Poor    Cognition Arousal/Alertness: Awake/alert   Overall Cognitive Status: No family/caregiver present to determine baseline cognitive functioning General Comments: Patient repeats herself about leg pain, wanting her robe, etc        General Comments General comments (skin integrity, edema, etc.): Oxygen saturation stable on 4L HFNC. O2 sat 95% at rest in bed, 98% post transfer to chair       Pertinent Vitals/Pain Pain Assessment: Faces Faces Pain Scale: Hurts little more Pain Location: BLEs (L more than R) Pain Descriptors / Indicators: Guarding;Grimacing Pain Intervention(s): Limited activity within patient's tolerance;Monitored during session;Repositioned;Other (comment)(nurse aware of patient's leg pain)           PT Goals (current goals can now be found in the care plan section) Progress towards PT goals: Progressing toward goals    Frequency    Min 2X/week      PT Plan Current plan remains appropriate       AM-PAC PT "6 Clicks" Mobility   Outcome Measure  Help needed turning from your back to your side while in a flat bed without using bedrails?: A Little Help needed moving from lying on your back to sitting on the side of a flat bed without using bedrails?: A Lot Help needed moving to and from a bed to a chair (including a wheelchair)?: A Lot Help needed standing up from a chair using your arms (e.g., wheelchair or bedside chair)?: A Lot Help needed to walk in hospital room?: A Lot Help needed climbing 3-5 steps with a railing? : Total 6  Click Score: 12    End of Session Equipment Utilized During Treatment: Oxygen Activity Tolerance: Patient limited by pain;Patient limited by fatigue Patient left: in chair;with call bell/phone within reach;with  chair alarm set Nurse Communication: Other (comment)(nurse cleared patient to participate in PT session) PT Visit Diagnosis: Muscle weakness (generalized) (M62.81);History of falling (Z91.81);Difficulty in walking, not elsewhere classified (R26.2);Adult, failure to thrive (R62.7);Pain     Time: 4327-6147 PT Time Calculation (min) (ACUTE ONLY): 33 min  Charges:  $Therapeutic Activity: 23-37 mins                     Birdie Hopes, PT, DPT Acute Rehab [phone] office     Birdie Hopes 02/01/2020, 1:20 PM

## 2020-02-01 NOTE — Progress Notes (Signed)
PROGRESS NOTE                                                                                                                                                                                                             Patient Demographics:    Gina [name], is a 86 y.o. female, DOB - 1929-08-18, CVE:[id number]  Outpatient Primary MD for the patient is [name], NP    LOS - 8  Admit date - 01/23/2020    Chief Complaint  Patient presents with  . Fever       Brief Narrative  86 year old with a history of gastric ulcer/AV malformations, PVD status post bilateral femoropopliteal bypasses, chronic hypoxic respiratory failure on 2 L,HTN, HLD, DM, CKD stage IV, mesenteric ischemia, Graves' disease, and diastolic congestive heart failure who presented to the ED complaining of weakness, she was recently [place] on 12/[place]/19 for Symptomatic Anemia during that admission she received some blood transfusions and had EGD showing multiple angiodysplastic lesions in her gastric fundus.  Repeat EGD was also planned that admission in a few weeks.  This admission she was found to have evidence of CHF + acute hepatitis A for which GI is still following her.   Subjective:   Patient in bed, appears comfortable, denies any headache, no fever, no chest pain or pressure, no shortness of breath , no abdominal pain. No focal weakness.    Assessment  & Plan :     1. Recurrent acute on chronic symptomatic anemia, history of UGI AVMs - post EGD x 2 and 3 units of packed RBC transfusion total between this and previous admission.  Case discussed with Dr. Carlota Raspberry on 01/31/2020.  Continue PPI monitor H&H with outpatient follow-up with Dr. Benson Norway 1 month after discharge.  2.  Acute hepatitis A.  Due to food contamination, supportive care self resolving improved.  INR stable suggesting stable synthetic function.  Currently no signs of acute liver  failure.   3.  Acute metabolic encephalopathy.  Supportive care.  Minimize narcotics and benzodiazepines.  4.  AKI on CKD 4.  Baseline creatinine around 1.8, improving with diuresis monitor.  5.  Hypertension.  Currently on Norvasc, Coreg and hydralazine.  Hydralazine dose increased for better control.  6.  Frail status, protein calorie malnutrition.  Added prostat, DNR now discussed with son, PT OT  May require placement.  7.  Hypothyroidism.    Elevated TSH, Synthroid dose increased from 112 mcg to 150 mcg, will check free T4 and T3 as well however TSH needs to be rechecked in 4 to 6 weeks.  8.  Right arm chronic superficial thrombophlebitis.  Supportive care.  Avoid IV access in the right arm.  9.  Acute on chronic diastolic CHF EF 07%.  Diurese, beta-blocker and monitor electrolytes.    Condition - Extremely Guarded  Family Communication  :   Annie Sable [phone] on 01/31/20  Niece [name] -  [phone] on 01/31/20   Code Status :  DNR  Diet :   Diet Order            Diet regular Room service appropriate? No; Fluid consistency: Thin  Diet effective now               Disposition Plan  :   Status is: Inpatient  Remains inpatient appropriate because:Inpatient level of care appropriate due to severity of illness   Dispo: The patient is from: ALF              Anticipated d/c is to: ALF/SNF              Anticipated d/c date is: 3 days              Patient currently is not medically stable to d/c.  Consults  :  GI  Procedures  :     PRBC transfusion  EGD on April 29  TTE  1. Left ventricular ejection fraction, by estimation, is 55%. The left ventricle has normal function. The left ventricle has no regional wall motion abnormalities. There is mild left ventricular hypertrophy. Left ventricular diastolic parameters are consistent with Grade III diastolic dysfunction (restrictive). Elevated  left ventricular end-diastolic pressure.  2. Right ventricular  systolic function is normal. The right ventricular size is moderately enlarged. There is mildly elevated pulmonary artery systolic pressure.  3. Left atrial size was severely dilated.  4. Right atrial size was moderately dilated.  5. Large pleural effusion.  6. The mitral valve is normal in structure. Mild mitral valve regurgitation. No evidence of mitral stenosis.  7. Tricuspid valve regurgitation is mild to moderate.  8. The aortic valve is tricuspid. Aortic valve regurgitation is not visualized. No aortic stenosis is present.  9. The inferior vena cava is dilated in size with <50% respiratory variability, suggesting right atrial pressure of 15 mmHg.    PUD Prophylaxis : PPI  DVT Prophylaxis  :    SCDs    Lab Results  Component Value Date   PLT 197 02/01/2020    Inpatient Medications  Scheduled Meds: . carvedilol  3.125 mg Oral BID WC  . feeding supplement (PRO-STAT SUGAR FREE 64)  30 mL Oral TID WC  . furosemide  40 mg Intravenous BID  . guaiFENesin  600 mg Oral BID  . hydrALAZINE  100 mg Oral Q8H  . insulin aspart  0-9 Units Subcutaneous TID WC  . latanoprost  1 drop Both Eyes QHS  . levothyroxine  112 mcg Oral Daily  . mouth rinse  15 mL Mouth Rinse BID  . Netarsudil Dimesylate  1 drop Both Eyes QHS  . pantoprazole  40 mg Oral Daily  . senna-docusate  1 tablet Oral BID   Continuous Infusions: PRN Meds:.acetaminophen, albuterol, oxyCODONE  Antibiotics  :    Anti-infectives (From admission, onward)   None  Time Spent in minutes  30   Lala Lund M.D on 02/01/2020 at 11:42 AM  To page go to [email or web address] - password [facility]  [facility] -  Office  [phone]  See all Orders from today for further details    Objective:   Vitals:   01/31/20 2140 02/01/20 0517 02/01/20 0526 02/01/20 0922  BP: (!) 133/54 (!) 150/58 (!) 152/61 (!) 148/50  Pulse:   67 67  Resp:   18   Temp:   97.9 F (36.6 C)   TempSrc:   Axillary   SpO2:   95%    Weight:   52.6 kg   Height:        Wt Readings from Last 3 Encounters:  02/01/20 52.6 kg  12/27/19 49 kg  11/24/19 48.1 kg     Intake/Output Summary (Last 24 hours) at 02/01/2020 1142 Last data filed at 02/01/2020 0530 Gross per 24 hour  Intake --  Output 850 ml  Net -850 ml     Physical Exam  Elderly frail and cachectic female, awake Alert, No new F.N deficits,  [place].AT,PERRAL Supple Neck,No JVD, No cervical lymphadenopathy appriciated.  Symmetrical Chest wall movement, Good air movement bilaterally, CTAB RRR,No Gallops, Rubs or new Murmurs, No Parasternal Heave +ve B.Sounds, Abd Soft, No tenderness, No organomegaly appriciated, No rebound - guarding or rigidity. No Cyanosis, Clubbing or edema, No new Rash or bruise     Data Review:    CBC Recent Labs  Lab 01/26/20 0541 01/27/20 0341 01/28/20 0958 01/30/20 0755 02/01/20 0844  WBC 6.2 6.8 6.6 6.1 7.3  HGB 9.7* 10.3* 11.6* 11.3* 10.7*  HCT 30.6* 32.4* 36.5 36.2 34.2*  PLT 119* 143* 163 170 197  MCV 89.7 88.8 89.0 90.0 90.0  MCH 28.4 28.2 28.3 28.1 28.2  MCHC 31.7 31.8 31.8 31.2 31.3  RDW 16.4* 16.2* 16.6* 16.3* 16.3*  LYMPHSABS 0.5* 0.7 0.8  --  0.8  MONOABS 0.5 0.6 0.5  --  0.6  EOSABS 0.2 0.1 0.1  --  0.1  BASOSABS 0.0 0.0 0.0  --  0.0    Chemistries  Recent Labs  Lab 01/27/20 0341 01/27/20 0341 01/28/20 0958 01/29/20 0729 01/30/20 0755 01/31/20 0450 01/31/20 0922 02/01/20 0844  NA 139  --  141  --  140 140  --  136  K 3.9  --  3.8  --  3.7 4.6  --  3.7  CL 105  --  105  --  104 103  --  102  CO2 26  --  27  --  26 27  --  27  GLUCOSE 97  --  95  --  103* 106*  --  104*  BUN 82*  --  80*  --  74* 75*  --  77*  CREATININE 2.23*  --  2.04*  --  1.81* 1.94*  --  1.89*  CALCIUM 8.3*  --  8.5*  --  8.8* 8.5*  --  8.2*  AST 150*  --  75*  --  45* 36  --  31  ALT 355*  --  258*  --  168* 137*  --  101*  ALKPHOS 124  --  120  --  104 97  --  90  BILITOT 1.1  --  1.1  --  1.0 0.9  --  0.6  MG 2.0   --  2.1 2.1  --  2.1 2.2  --   INR 1.3*   < > 1.2 1.2  1.1 1.2  --  1.2  TSH  --   --   --   --   --   --  29.485*  --    < > = values in this interval not displayed.     ------------------------------------------------------------------------------------------------------------------ No results for input(s): CHOL, HDL, LDLCALC, TRIG, CHOLHDL, LDLDIRECT in the last 72 hours.  Lab Results  Component Value Date   HGBA1C 6.0 (H) 01/24/2020   ------------------------------------------------------------------------------------------------------------------ Recent Labs    01/31/20 0922  TSH 29.485*    Cardiac Enzymes No results for input(s): CKMB, TROPONINI, MYOGLOBIN in the last 168 hours.  Invalid input(s): CK ------------------------------------------------------------------------------------------------------------------    Component Value Date/Time   BNP 1,852.6 (H) 01/31/2020 3790    Micro Results Recent Results (from the past 240 hour(s))  Respiratory Panel by RT PCR (Flu A&B, Covid) - Nasopharyngeal Swab     Status: None   Collection Time: 01/23/20 11:47 PM   Specimen: Nasopharyngeal Swab  Result Value Ref Range Status   SARS Coronavirus 2 by RT PCR NEGATIVE NEGATIVE Final    Comment: (NOTE) SARS-CoV-2 target nucleic acids are NOT DETECTED. The SARS-CoV-2 RNA is generally detectable in upper respiratoy specimens during the acute phase of infection. The lowest concentration of SARS-CoV-2 viral copies this assay can detect is 131 copies/mL. A negative result does not preclude SARS-Cov-2 infection and should not be used as the sole basis for treatment or other patient management decisions. A negative result may occur with  improper specimen collection/handling, submission of specimen other than nasopharyngeal swab, presence of viral mutation(s) within the areas targeted by this assay, and inadequate number of viral copies (<131 copies/mL). A negative result must be  combined with clinical observations, patient history, and epidemiological information. The expected result is Negative. Fact Sheet for Patients:  PinkCheek.be Fact Sheet for Healthcare Providers:  GravelBags.it This test is not yet ap proved or cleared by the [place] FDA and  has been authorized for detection and/or diagnosis of SARS-CoV-2 by FDA under an Emergency Use Authorization (EUA). This EUA will remain  in effect (meaning this test can be used) for the duration of the COVID-19 declaration under Section 564(b)(1) of the Act, 21 U.S.C. section 360bbb-3(b)(1), unless the authorization is terminated or revoked sooner.    Influenza A by PCR NEGATIVE NEGATIVE Final   Influenza B by PCR NEGATIVE NEGATIVE Final    Comment: (NOTE) The Xpert Xpress SARS-CoV-2/FLU/RSV assay is intended as an aid in  the diagnosis of influenza from Nasopharyngeal swab specimens and  should not be used as a sole basis for treatment. Nasal washings and  aspirates are unacceptable for Xpert Xpress SARS-CoV-2/FLU/RSV  testing. Fact Sheet for Patients: PinkCheek.be Fact Sheet for Healthcare Providers: GravelBags.it This test is not yet approved or cleared by the [place] FDA and  has been authorized for detection and/or diagnosis of SARS-CoV-2 by  FDA under an Emergency Use Authorization (EUA). This EUA will remain  in effect (meaning this test can be used) for the duration of the  Covid-19 declaration under Section 564(b)(1) of the Act, 21  U.S.C. section 360bbb-3(b)(1), unless the authorization is  terminated or revoked. Performed at [facility], [place] [address], [place], [place] [address]   Urine culture     Status: Abnormal   Collection Time: 01/24/20 11:55 AM   Specimen: Urine, Random  Result Value Ref Range Status   Specimen Description URINE, RANDOM  Final    Special Requests   Final  NONE Performed at [facility], [place] [address] [place], [place] [address]    Culture MULTIPLE SPECIES PRESENT, SUGGEST RECOLLECTION (A)  Final   Report Status 01/25/2020 FINAL  Final  MRSA PCR Screening     Status: None   Collection Time: 01/24/20  2:02 PM   Specimen: Nasopharyngeal  Result Value Ref Range Status   MRSA by PCR NEGATIVE NEGATIVE Final    Comment:        The GeneXpert MRSA Assay (FDA approved for NASAL specimens only), is one component of a comprehensive MRSA colonization surveillance program. It is not intended to diagnose MRSA infection nor to guide or monitor treatment for MRSA infections. Performed at [facility], [place] [address] [place], [place] [address]     Radiology Reports DG Chest 2 View  Result Date: 01/30/2020 CLINICAL DATA:  Pleural effusions, hypoxia, fever and CHF. EXAM: CHEST - 2 VIEW COMPARISON:  01/26/2020 FINDINGS: Stable calcified thoracic aorta. Lung volumes are lower compared to the prior study. Relatively stable moderate-sized bilateral pleural effusions. Suggestion of mild pulmonary interstitial edema. No pneumothorax. IMPRESSION: Stable moderate bilateral pleural effusions. Suggestion of mild pulmonary interstitial edema. Electronically Signed   By: Aletta Edouard M.D.   On: 01/30/2020 14:39   DG Knee 1-2 Views Right  Result Date: 01/24/2020 CLINICAL DATA:  Fall EXAM: RIGHT KNEE - 1-2 VIEW COMPARISON:  None. FINDINGS: No evidence of fracture, dislocation, or joint effusion. There is diffuse osteopenia. Dense vascular calcifications are seen. Surgical clips are seen posteriorly. Soft tissue swelling seen surrounding the knee. IMPRESSION: No acute osseous abnormality. Diffuse soft tissue swelling surrounding the knee. Electronically Signed   By: Prudencio Pair M.D.   On: 01/24/2020 01:35   US RENAL  Result Date: 01/25/2020 CLINICAL DATA:  Elevated creatinine EXAM: RENAL / URINARY TRACT ULTRASOUND  COMPLETE COMPARISON:  01/15/2019 FINDINGS: Right Kidney: Renal measurements: 10.2 x 4.5 x 4.9 cm. = volume: 118 mL . Echogenicity within normal limits. No mass or hydronephrosis visualized. Left Kidney: Renal measurements: 8.4 x 4.7 x 5.0 cm. = volume: 103 mL. Kidney is only partially visualized. No mass lesion or hydronephrosis is noted. Bladder: Bladder wall is thickened with internal septations. Other: None. IMPRESSION: Normal-appearing kidneys without hydronephrosis. Bladder wall thickening which may be related incomplete distension. A focal septation is noted within the bladder of uncertain significance. Direct visualization may be helpful as necessary. Electronically Signed   By: Inez Catalina M.D.   On: 01/25/2020 22:14   DG Chest Port 1 View  Result Date: 01/31/2020 CLINICAL DATA:  Short of breath. EXAM: PORTABLE CHEST 1 VIEW COMPARISON:  01/30/2020 FINDINGS: Previous median sternotomy and CABG procedure. Moderate bilateral pleural effusions are again noted. Mild pulmonary interstitial edema. No pneumothorax. IMPRESSION: Persistent bilateral pleural effusions and mild pulmonary interstitial edema. Electronically Signed   By: Kerby Moors M.D.   On: 01/31/2020 09:45   DG CHEST PORT 1 VIEW  Result Date: 01/26/2020 CLINICAL DATA:  Patient with fever and confusion. EXAM: PORTABLE CHEST 1 VIEW COMPARISON:  Chest radiograph 01/25/2020. FINDINGS: Monitoring leads overlie the patient. Stable scattered cardiac and mediastinal contours. Aortic atherosclerosis. Moderate to large layering bilateral pleural effusions with underlying consolidation. Similar bilateral interstitial opacities. No pneumothorax. IMPRESSION: Moderate to large layering bilateral pleural effusions with underlying consolidation. Electronically Signed   By: Lovey Newcomer M.D.   On: 01/26/2020 17:44   DG Chest Port 1 View  Result Date: 01/25/2020 CLINICAL DATA:  Pleural effusion. EXAM: PORTABLE CHEST 1  VIEW COMPARISON:  January 24, 2020.  FINDINGS: Stable cardiomediastinal silhouette. Atherosclerosis of thoracic aorta is noted. Stable bilateral pleural effusions are noted, with associated bilateral pulmonary edema or atelectasis. Bony thorax is unremarkable. IMPRESSION: Stable bilateral pleural effusions are noted with associated bilateral pulmonary edema or atelectasis. Aortic Atherosclerosis (ICD10-I70.0). Electronically Signed   By: Marijo Conception M.D.   On: 01/25/2020 09:18   DG Chest Portable 1 View  Result Date: 01/24/2020 CLINICAL DATA:  Patient found on the floor. EXAM: PORTABLE CHEST 1 VIEW COMPARISON:  December 23, 2019 FINDINGS: Mild to moderate severity areas of atelectasis and/or infiltrate are seen within the bilateral lung bases. This represents a new finding when compared to the prior study. Moderate size bilateral pleural effusions are seen with a moderate amount of fluid noted within the minor fissure. This is increased in severity when compared to the prior exam. The cardiac silhouette is mildly enlarged and stable in size. Marked severity calcification of the aortic arch is seen. Multilevel degenerative changes seen throughout the thoracic spine. IMPRESSION: 1. Mild to moderate severity bibasilar atelectasis and/or infiltrate. 2. Moderate size bilateral pleural effusions with a moderate amount of fluid within the minor fissure. Electronically Signed   By: Virgina Norfolk M.D.   On: 01/24/2020 01:13   ECHOCARDIOGRAM COMPLETE  Result Date: 01/24/2020    ECHOCARDIOGRAM REPORT   Patient Name:   Gina [name] [place] Date of Exam: 01/24/2020 Medical Rec #:  [id number]        Height:       67.0 in Accession #:    [id number]       Weight:       116.0 lb Date of Birth:  04/16/1929        BSA:          1.604 m Patient Age:    45 years         BP:           128/51 mmHg Patient Gender: F                HR:           62 bpm. Exam Location:  Inpatient Procedure: 2D Echo, Cardiac Doppler and Color Doppler Indications:    I50.33 Acute on  chronic diastolic (congestive) heart failure  History:        Patient has prior history of Echocardiogram examinations, most                 recent 09/16/2019. Risk Factors:Hypertension, Diabetes and                 Dyslipidemia. Thyroid Disease.  Sonographer:    Tiffany Dance Referring Phys: [id number] [place]  1. Left ventricular ejection fraction, by estimation, is 55%. The left ventricle has normal function. The left ventricle has no regional wall motion abnormalities. There is mild left ventricular hypertrophy. Left ventricular diastolic parameters are consistent with Grade III diastolic dysfunction (restrictive). Elevated left ventricular end-diastolic pressure.  2. Right ventricular systolic function is normal. The right ventricular size is moderately enlarged. There is mildly elevated pulmonary artery systolic pressure.  3. Left atrial size was severely dilated.  4. Right atrial size was moderately dilated.  5. Large pleural effusion.  6. The mitral valve is normal in structure. Mild mitral valve regurgitation. No evidence of mitral stenosis.  7. Tricuspid valve regurgitation is mild to moderate.  8. The aortic valve is tricuspid. Aortic valve regurgitation is not visualized. No  aortic stenosis is present.  9. The inferior vena cava is dilated in size with <50% respiratory variability, suggesting right atrial pressure of 15 mmHg. Comparison(s): A prior study was performed on 09/16/2019. Prior images reviewed side by side. RV/RA size has increased, TR has increased, RA pressure has increased. Similar appearance of large pleural effusion. FINDINGS  Left Ventricle: Left ventricular ejection fraction, by estimation, is 55%. The left ventricle has normal function. The left ventricle has no regional wall motion abnormalities. The left ventricular internal cavity size was normal in size. There is mild left ventricular hypertrophy. Left ventricular diastolic parameters are consistent with Grade III  diastolic dysfunction (restrictive). Elevated left ventricular end-diastolic pressure. Right Ventricle: The right ventricular size is moderately enlarged. No increase in right ventricular wall thickness. Right ventricular systolic function is normal. There is mildly elevated pulmonary artery systolic pressure. The tricuspid regurgitant velocity is 2.62 m/s, and with an assumed right atrial pressure of 15 mmHg, the estimated right ventricular systolic pressure is 19.5 mmHg. Left Atrium: Left atrial size was severely dilated. Right Atrium: Right atrial size was moderately dilated. Pericardium: Trivial pericardial effusion is present. Mitral Valve: The mitral valve is normal in structure. Normal mobility of the mitral valve leaflets. Mild mitral valve regurgitation. No evidence of mitral valve stenosis. Tricuspid Valve: The tricuspid valve is normal in structure. Tricuspid valve regurgitation is mild to moderate. No evidence of tricuspid stenosis. Aortic Valve: The aortic valve is tricuspid. Aortic valve regurgitation is not visualized. No aortic stenosis is present. Pulmonic Valve: The pulmonic valve was normal in structure. Pulmonic valve regurgitation is trivial. No evidence of pulmonic stenosis. Aorta: The aortic root is normal in size and structure. Venous: The inferior vena cava is dilated in size with less than 50% respiratory variability, suggesting right atrial pressure of 15 mmHg. IAS/Shunts: No atrial level shunt detected by color flow Doppler. Additional Comments: There is a large pleural effusion.  LEFT VENTRICLE PLAX 2D LVIDd:         4.90 cm  Diastology LVIDs:         3.35 cm  LV e' lateral:   5.87 cm/s LV PW:         0.88 cm  LV E/e' lateral: 14.1 LV IVS:        0.75 cm  LV e' medial:    2.98 cm/s LVOT diam:     2.00 cm  LV E/e' medial:  27.8 LV SV:         58 LV SV Index:   36 LVOT Area:     3.14 cm  RIGHT VENTRICLE            IVC RV Basal diam:  2.70 cm    IVC diam: 2.69 cm RV S prime:     9.20 cm/s  TAPSE (M-mode): 1.3 cm LEFT ATRIUM             Index       RIGHT ATRIUM           Index LA diam:        4.00 cm 2.49 cm/m  RA Area:     18.00 cm LA Vol (A2C):   96.8 ml 60.34 ml/m RA Volume:   51.50 ml  32.10 ml/m LA Vol (A4C):   56.2 ml 35.03 ml/m LA Biplane Vol: 74.1 ml 46.19 ml/m  AORTIC VALVE LVOT Vmax:   73.45 cm/s LVOT Vmean:  48.000 cm/s LVOT VTI:    0.186 m  AORTA Ao Root diam:  3.30 cm Ao Asc diam:  2.70 cm MITRAL VALVE               TRICUSPID VALVE MV Area (PHT): 4.80 cm    TR Peak grad:   27.5 mmHg MV Decel Time: 158 msec    TR Vmax:        262.00 cm/s MV E velocity: 82.90 cm/s MV A velocity: 42.20 cm/s  SHUNTS MV E/A ratio:  1.96        Systemic VTI:  0.19 m                            Systemic Diam: 2.00 cm Cherlynn Kaiser MD Electronically signed by Cherlynn Kaiser MD Signature Date/Time: 01/24/2020/5:36:14 PM    Final    VAS [place] UPPER EXTREMITY VENOUS DUPLEX  Result Date: 01/30/2020 UPPER VENOUS STUDY  Indications: Swelling Risk Factors: None identified. Limitations: Body habitus and patient positioning. Comparison Study: No prior studies. Performing Technologist: Oliver Hum RVT  Examination Guidelines: A complete evaluation includes B-mode imaging, spectral Doppler, color Doppler, and power Doppler as needed of all accessible portions of each vessel. Bilateral testing is considered an integral part of a complete examination. Limited examinations for reoccurring indications may be performed as noted.  Right Findings: +----------+------------+---------+-----------+----------+-------+ RIGHT     CompressiblePhasicitySpontaneousPropertiesSummary +----------+------------+---------+-----------+----------+-------+ IJV           Full       Yes       Yes                      +----------+------------+---------+-----------+----------+-------+ Subclavian    Full       Yes       Yes                      +----------+------------+---------+-----------+----------+-------+ Axillary       Full       Yes       Yes                      +----------+------------+---------+-----------+----------+-------+ Brachial      Full       Yes       Yes                      +----------+------------+---------+-----------+----------+-------+ Radial        Full                                          +----------+------------+---------+-----------+----------+-------+ Ulnar         Full                                          +----------+------------+---------+-----------+----------+-------+ Cephalic      Full                                          +----------+------------+---------+-----------+----------+-------+ Basilic       None  Chronic +----------+------------+---------+-----------+----------+-------+  Left Findings: +----------+------------+---------+-----------+----------+-------+ LEFT      CompressiblePhasicitySpontaneousPropertiesSummary +----------+------------+---------+-----------+----------+-------+ Subclavian    Full       Yes       Yes                      +----------+------------+---------+-----------+----------+-------+  Summary:  Right: No evidence of deep vein thrombosis in the upper extremity. Findings consistent with chronic superficial vein thrombosis involving the right basilic vein.  Left: No evidence of thrombosis in the subclavian.  *See table(s) above for measurements and observations.  Diagnosing physician: Monica Martinez MD Electronically signed by Monica Martinez MD on 01/30/2020 at 1:10:17 PM.    Final    US Abdomen Limited RUQ  Result Date: 01/24/2020 CLINICAL DATA:  Elevated bilirubin. EXAM: ULTRASOUND ABDOMEN LIMITED RIGHT UPPER QUADRANT COMPARISON:  None. FINDINGS: Gallbladder: Multiple the folds are seen within the gallbladder wall. Floating echogenic debris is seen within the gallbladder lumen. No gallstones or wall thickening visualized (1.8 mm). No sonographic Murphy sign noted by  sonographer. Common bile duct: Diameter: 3.2 mm Liver: No focal lesion identified. Diffusely increased echogenicity of the liver parenchyma is seen. Portal vein is patent on color Doppler imaging with normal direction of blood flow towards the liver. Other: A small right pleural effusion is suspected. IMPRESSION: 1. Multiple gallbladder folds with gallbladder sludge. 2. No evidence of cholelithiasis or acute cholecystitis. 3. Fatty liver. 4. Small right pleural effusion. Electronically Signed   By: Virgina Norfolk M.D.   On: 01/24/2020 02:28

## 2020-02-01 NOTE — Progress Notes (Signed)
Inpatient Diabetes Program Recommendations  AACE/ADA: New Consensus Statement on Inpatient Glycemic Control (2015)  Target Ranges:  Prepandial:   less than 140 mg/dL      Peak postprandial:   less than 180 mg/dL (1-2 hours)      Critically ill patients:  140 - 180 mg/dL   Lab Results  Component Value Date   GLUCAP 171 (H) 02/01/2020   HGBA1C 6.0 (H) 01/24/2020    Review of Glycemic Control Results for Gina [name], Gina [name] (MRN [id number]) as of 02/01/2020 15:07  Ref. Range 01/31/2020 17:15 01/31/2020 17:57  Glucose-Capillary Latest Ref Range: 70 - 99 mg/dL 59 (L) 126 (H)   Diabetes history: Type 2 DM Outpatient Diabetes medications: none Current orders for Inpatient glycemic control: Novolog 0-9 units TID  Inpatient Diabetes Program Recommendations:    Noted patient experienced mild hypoglycemic event of 59 mg/dL following 1 unit of Novolog. If continues to experience lows, consider decreasing correction to Novolog 0-6 units TID.   Thanks, Bronson Curb, MSN, RNC-OB Diabetes Coordinator [phone] (8a-5p)

## 2020-02-02 LAB — CBC WITH DIFFERENTIAL/PLATELET
Abs Immature Granulocytes: 0.05 10*3/uL (ref 0.00–0.07)
Basophils Absolute: 0 10*3/uL (ref 0.0–0.1)
Basophils Relative: 0 %
Eosinophils Absolute: 0.1 10*3/uL (ref 0.0–0.5)
Eosinophils Relative: 1 %
HCT: 29.8 % — ABNORMAL LOW (ref 36.0–46.0)
Hemoglobin: 9.5 g/dL — ABNORMAL LOW (ref 12.0–15.0)
Immature Granulocytes: 1 %
Lymphocytes Relative: 13 %
Lymphs Abs: 0.9 10*3/uL (ref 0.7–4.0)
MCH: 28.4 pg (ref 26.0–34.0)
MCHC: 31.9 g/dL (ref 30.0–36.0)
MCV: 89 fL (ref 80.0–100.0)
Monocytes Absolute: 0.6 10*3/uL (ref 0.1–1.0)
Monocytes Relative: 9 %
Neutro Abs: 5.1 10*3/uL (ref 1.7–7.7)
Neutrophils Relative %: 76 %
Platelets: 177 10*3/uL (ref 150–400)
RBC: 3.35 MIL/uL — ABNORMAL LOW (ref 3.87–5.11)
RDW: 16.1 % — ABNORMAL HIGH (ref 11.5–15.5)
WBC: 6.7 10*3/uL (ref 4.0–10.5)
nRBC: 0 % (ref 0.0–0.2)

## 2020-02-02 LAB — COMPREHENSIVE METABOLIC PANEL
ALT: 88 U/L — ABNORMAL HIGH (ref 0–44)
AST: 29 U/L (ref 15–41)
Albumin: 2.4 g/dL — ABNORMAL LOW (ref 3.5–5.0)
Alkaline Phosphatase: 92 U/L (ref 38–126)
Anion gap: 9 (ref 5–15)
BUN: 78 mg/dL — ABNORMAL HIGH (ref 8–23)
CO2: 27 mmol/L (ref 22–32)
Calcium: 8.1 mg/dL — ABNORMAL LOW (ref 8.9–10.3)
Chloride: 100 mmol/L (ref 98–111)
Creatinine, Ser: 1.95 mg/dL — ABNORMAL HIGH (ref 0.44–1.00)
GFR calc Af Amer: 26 mL/min — ABNORMAL LOW (ref >60)
GFR calc non Af Amer: 22 mL/min — ABNORMAL LOW (ref >60)
Glucose, Bld: 101 mg/dL — ABNORMAL HIGH (ref 70–99)
Potassium: 3.5 mmol/L (ref 3.5–5.1)
Sodium: 136 mmol/L (ref 135–145)
Total Bilirubin: 0.8 mg/dL (ref 0.3–1.2)
Total Protein: 5.4 g/dL — ABNORMAL LOW (ref 6.5–8.1)

## 2020-02-02 LAB — C-REACTIVE PROTEIN: CRP: 2.6 mg/dL — ABNORMAL HIGH (ref <1.0)

## 2020-02-02 LAB — GLUCOSE, CAPILLARY
Glucose-Capillary: 97 mg/dL (ref 70–99)
Glucose-Capillary: 114 mg/dL — ABNORMAL HIGH (ref 70–99)
Glucose-Capillary: 139 mg/dL — ABNORMAL HIGH (ref 70–99)
Glucose-Capillary: 171 mg/dL — ABNORMAL HIGH (ref 70–99)

## 2020-02-02 LAB — PROTIME-INR
INR: 1.2 (ref 0.8–1.2)
Prothrombin Time: 14.9 seconds (ref 11.4–15.2)

## 2020-02-02 LAB — T3: T3, Total: 27 ng/dL — ABNORMAL LOW (ref 71–180)

## 2020-02-02 MED ORDER — ENSURE ENLIVE PO LIQD
237.0000 mL | Freq: Two times a day (BID) | ORAL | Status: DC
  Administered 2020-02-02 – 2020-02-03 (×3): 237 mL via ORAL

## 2020-02-02 NOTE — Progress Notes (Signed)
Initial Nutrition Assessment  DOCUMENTATION CODES:   Underweight  INTERVENTION:  Provide Ensure Enlive po BID, each supplement provides 350 kcal and 20 grams of protein  Provide 30 ml Prostat po TID, each supplement provides 100 kcal and 15 grams of protein.   Encourage adequate PO intake.   NUTRITION DIAGNOSIS:   Increased nutrient needs related to chronic illness(CHF) as evidenced by estimated needs.  GOAL:   Patient will meet greater than or equal to 90% of their needs  MONITOR:   PO intake, Supplement acceptance, Skin, Weight trends, Labs, I & O's  REASON FOR ASSESSMENT:   Malnutrition Screening Tool    ASSESSMENT:   86 year old with a history of gastric ulcer/AV malformations, PVD status post bilateral femoropopliteal bypasses, chronic hypoxic respiratory failure on 2 L, HTN, HLD, DM, CKD stage IV, mesenteric ischemia, Graves' disease, and diastolic congestive heart failure presents with weakness. Pt with recurrent acute on chronic symptomatic anemia and hepatitis A postitive.  RD working remotely.  Pt unavailable during attempted time of contact. Meal completion has been 30-50%. Pt with no significant weight loss per weight records. Pt currently has Ensure and Prostat ordered and has been consuming them. RD to continue with current orders to aid in caloric and protein needs.   Unable to complete Nutrition-Focused physical exam at this time.   Labs and medications reviewed.   Diet Order:   Diet Order            Diet regular Room service appropriate? No; Fluid consistency: Thin  Diet effective now              EDUCATION NEEDS:   Not appropriate for education at this time  Skin:  Skin Assessment: Skin Integrity Issues: Skin Integrity Issues:: Other (Comment) Other: non pressure wound R knee  Last BM:  5/4  Height:   Ht Readings from Last 1 Encounters:  01/24/20 5\' 7"  (1.702 m)    Weight:   Wt Readings from Last 1 Encounters:  02/02/20 52.7 kg     BMI:  Body mass index is 18.2 kg/m.  Estimated Nutritional Needs:   Kcal:  1600-1800  Protein:  75-85 grams  Fluid:  >/= 1.6 L/day  Corrin Parker, MS, RD, LDN RD pager number/after hours weekend pager number on Am[employer].

## 2020-02-02 NOTE — TOC Progression Note (Signed)
Transition of Care [facility]) - Progression Note    Patient Details  Name: Gina [name] MRN: [id number] Date of Birth: [birth year]  Transition of [facility]) CM/SW [facility], LCSW Phone Number: 02/02/2020, 5:59 PM  Clinical Narrative:    Insurance approval received for [place]: [id number] however only effective until 5/6. CSW will contact insurance if patient is not ready to discharge tomorrow.    Expected Discharge Plan: Skilled Nursing Facility Barriers to Discharge: Continued Medical Work up, Ship broker  Expected Discharge Plan and Services Expected Discharge Plan: [place] In-house Referral: Clinical Social Work   Post Acute Care Choice: [place] Living arrangements for the past 2 months: [place], Apartment                                       Social Determinants of Health (SDOH) Interventions    Readmission Risk Interventions Readmission Risk Prevention Plan 01/27/2020 04/20/2019 04/15/2019  Transportation Screening Complete Complete Complete  PCP or Specialist Appt within 3-5 Days - Complete -  HRI or [place] - Complete -  Social Work Consult for [place] Planning/Counseling - Complete -  Palliative Care Screening - Not Applicable -  Medication Review Press photographer) Complete Complete -  PCP or Specialist appointment within 3-5 days of discharge Complete - -  [place] or Home Care Consult Complete - Complete  SW Recovery Care/Counseling Consult Complete - -  Palliative Care Screening Not Applicable - Not Applicable  Skilled Nursing Facility Complete - Not Applicable  Some recent data might be hidden

## 2020-02-02 NOTE — Progress Notes (Signed)
PROGRESS NOTE                                                                                                                                                                                                             Patient Demographics:    Gina [name], is a 86 y.o. female, DOB - Feb 08, 1929, BEM:[id number]  Outpatient Primary MD for the patient is [name], NP    LOS - 9  Admit date - 01/23/2020    Chief Complaint  Patient presents with  . Fever       Brief Narrative  86 year old with a history of gastric ulcer/AV malformations, PVD status post bilateral femoropopliteal bypasses, chronic hypoxic respiratory failure on 2 L,HTN, HLD, DM, CKD stage IV, mesenteric ischemia, Graves' disease, and diastolic congestive heart failure who presented to the ED complaining of weakness, she was recently [place] on 12/28/19 for Symptomatic Anemia during that admission she received some blood transfusions and had EGD showing multiple angiodysplastic lesions in her gastric fundus.  Repeat EGD was also planned that admission in a few weeks.  This admission she was found to have evidence of CHF + acute hepatitis A for which GI is still following her.   Subjective:   Patient in bed, appears comfortable, denies any headache, no fever, no chest pain or pressure, no shortness of breath , no abdominal pain. No focal weakness.    Assessment  & Plan :     1. Recurrent acute on chronic symptomatic anemia, history of UGI AVMs - post EGD x 2 and 3 units of packed RBC transfusion total between this and previous admission.  Dr Candiss Norse  discussed with Dr. Carol Ada on 01/31/2020.  Continue PPI monitor H&H with outpatient follow-up with Dr. Benson Norway 1 month after discharge.  2.  Acute hepatitis A.  Due to food contamination, supportive care self resolving improved.  INR stable suggesting stable synthetic function.  Currently no signs of acute liver  failure.   3.  Acute metabolic encephalopathy.  Supportive care.  Minimize narcotics and benzodiazepines.  4.  AKI on CKD 4.  Baseline creatinine around 1.8, improving with diuresis monitor.  5.  Hypertension.  Currently on Norvasc, Coreg and hydralazine.  Hydralazine dose increased for better control.  6.  Frail status, protein calorie malnutrition.  Added prostat, DNR now discussed with son,  PT OT May require placement.  7.  Hypothyroidism.    Elevated TSH, Synthroid dose increased from 112 mcg to 150 mcg, will check free T4 and T3 as well however TSH needs to be rechecked in 4 to 6 weeks.  8.  Right arm chronic superficial thrombophlebitis.  Supportive care.  Avoid IV access in the right arm.  9.  Acute on chronic diastolic CHF EF 00%.  Diurese, beta-blocker and monitor electrolytes.    Condition - Extremely Guarded  Family Communication  :   Annie Sable [phone] on 01/31/20  Niece [name] -  [phone] on 01/31/20   Code Status :  DNR  Diet :   Diet Order            Diet regular Room service appropriate? No; Fluid consistency: Thin  Diet effective now               Disposition Plan  :   Status is: Inpatient  Remains inpatient appropriate because:Inpatient level of care appropriate due to severity of illness   Dispo: The patient is from: ALF              Anticipated d/c is to: ALF/SNF              Anticipated d/c date is: 1 day              Patient currently is not medically stable to d/c.    Patient can be discharged tomorrow, if mentation continues to improve, she is more awake, as well if her appetite continues to improve as well  Consults  :  GI  Procedures  :     PRBC transfusion  EGD on April 29  TTE  1. Left ventricular ejection fraction, by estimation, is 55%. The left ventricle has normal function. The left ventricle has no regional wall motion abnormalities. There is mild left ventricular hypertrophy. Left ventricular diastolic  parameters are consistent with Grade III diastolic dysfunction (restrictive). Elevated  left ventricular end-diastolic pressure.  2. Right ventricular systolic function is normal. The right ventricular size is moderately enlarged. There is mildly elevated pulmonary artery systolic pressure.  3. Left atrial size was severely dilated.  4. Right atrial size was moderately dilated.  5. Large pleural effusion.  6. The mitral valve is normal in structure. Mild mitral valve regurgitation. No evidence of mitral stenosis.  7. Tricuspid valve regurgitation is mild to moderate.  8. The aortic valve is tricuspid. Aortic valve regurgitation is not visualized. No aortic stenosis is present.  9. The inferior vena cava is dilated in size with <50% respiratory variability, suggesting right atrial pressure of 15 mmHg.    PUD Prophylaxis : PPI  DVT Prophylaxis  :    SCDs    Lab Results  Component Value Date   PLT 177 02/02/2020    Inpatient Medications  Scheduled Meds: . carvedilol  3.125 mg Oral BID WC  . feeding supplement (ENSURE ENLIVE)  237 mL Oral BID BM  . feeding supplement (PRO-STAT SUGAR FREE 64)  30 mL Oral TID WC  . furosemide  40 mg Intravenous BID  . guaiFENesin  600 mg Oral BID  . hydrALAZINE  100 mg Oral Q8H  . insulin aspart  0-9 Units Subcutaneous TID WC  . latanoprost  1 drop Both Eyes QHS  . levothyroxine  150 mcg Oral Daily  . mouth rinse  15 mL Mouth Rinse BID  . Netarsudil Dimesylate  1 drop Both Eyes QHS  .  pantoprazole  40 mg Oral Daily  . senna-docusate  1 tablet Oral BID   Continuous Infusions: PRN Meds:.acetaminophen, albuterol, oxyCODONE  Antibiotics  :    Anti-infectives (From admission, onward)   None       Phillips Climes M.D on 02/02/2020 at 5:26 PM  To page go to [email or web address] - password [facility]  [facility] -  Office  [phone]  See all Orders from today for further details    Objective:   Vitals:   02/02/20 0529 02/02/20 0605  02/02/20 0609 02/02/20 1600  BP: (!) 150/42 (!) 145/38  (!) 155/43  Pulse: 66  67 (!) 59  Resp: 19     Temp: 98.1 F (36.7 C)     TempSrc: Oral     SpO2: 94%  95% 94%  Weight:   52.7 kg   Height:        Wt Readings from Last 3 Encounters:  02/02/20 52.7 kg  12/27/19 49 kg  11/24/19 48.1 kg     Intake/Output Summary (Last 24 hours) at 02/02/2020 1726 Last data filed at 02/02/2020 0645 Gross per 24 hour  Intake --  Output 800 ml  Net -800 ml     Physical Exam  Elderly, frail, cachectic female, laying in bed in no apparent distress, was lethargic earlier this morning, but she is much more awake in the afternoon . Good air entry bilaterally . RRR,No Gallops, Rubs or new Murmurs, No Parasternal Heave +ve B.Sounds, Abd Soft, No tenderness, No organomegaly appriciated, No rebound - guarding or rigidity. No Cyanosis, Clubbing or edema, No new Rash or bruise     Data Review:    CBC Recent Labs  Lab 01/27/20 0341 01/28/20 0958 01/30/20 0755 02/01/20 0844 02/02/20 0257  WBC 6.8 6.6 6.1 7.3 6.7  HGB 10.3* 11.6* 11.3* 10.7* 9.5*  HCT 32.4* 36.5 36.2 34.2* 29.8*  PLT 143* 163 170 197 177  MCV 88.8 89.0 90.0 90.0 89.0  MCH 28.2 28.3 28.1 28.2 28.4  MCHC 31.8 31.8 31.2 31.3 31.9  RDW 16.2* 16.6* 16.3* 16.3* 16.1*  LYMPHSABS 0.7 0.8  --  0.8 0.9  MONOABS 0.6 0.5  --  0.6 0.6  EOSABS 0.1 0.1  --  0.1 0.1  BASOSABS 0.0 0.0  --  0.0 0.0    Chemistries  Recent Labs  Lab 01/27/20 0341 01/27/20 0341 01/28/20 0958 01/28/20 0958 01/29/20 0729 01/30/20 0755 01/31/20 0450 01/31/20 0922 02/01/20 0844 02/01/20 1151 02/02/20 0257  NA 139   < > 141  --   --  140 140  --  136  --  136  K 3.9   < > 3.8  --   --  3.7 4.6  --  3.7  --  3.5  CL 105   < > 105  --   --  104 103  --  102  --  100  CO2 26   < > 27  --   --  26 27  --  27  --  27  GLUCOSE 97   < > 95  --   --  103* 106*  --  104*  --  101*  BUN 82*   < > 80*  --   --  74* 75*  --  77*  --  78*  CREATININE 2.23*    < > 2.04*  --   --  1.81* 1.94*  --  1.89*  --  1.95*  CALCIUM 8.3*   < > 8.5*  --   --  8.8* 8.5*  --  8.2*  --  8.1*  AST 150*   < > 75*  --   --  45* 36  --  31  --  29  ALT 355*   < > 258*  --   --  168* 137*  --  101*  --  88*  ALKPHOS 124   < > 120  --   --  104 97  --  90  --  92  BILITOT 1.1   < > 1.1  --   --  1.0 0.9  --  0.6  --  0.8  MG 2.0  --  2.1  --  2.1  --  2.1 2.2  --   --   --   INR 1.3*   < > 1.2   < > 1.2 1.1 1.2  --  1.2  --  1.2  TSH  --   --   --   --   --   --   --  29.485*  --  30.140*  --    < > = values in this interval not displayed.     ------------------------------------------------------------------------------------------------------------------ No results for input(s): CHOL, HDL, LDLCALC, TRIG, CHOLHDL, LDLDIRECT in the last 72 hours.  Lab Results  Component Value Date   HGBA1C 6.0 (H) 01/24/2020   ------------------------------------------------------------------------------------------------------------------ Recent Labs    02/01/20 1151  TSH 30.140*    Cardiac Enzymes No results for input(s): CKMB, TROPONINI, MYOGLOBIN in the last 168 hours.  Invalid input(s): CK ------------------------------------------------------------------------------------------------------------------    Component Value Date/Time   BNP 1,852.6 (H) 01/31/2020 3818    Micro Results Recent Results (from the past 240 hour(s))  Respiratory Panel by RT PCR (Flu A&B, Covid) - Nasopharyngeal Swab     Status: None   Collection Time: 01/23/20 11:47 PM   Specimen: Nasopharyngeal Swab  Result Value Ref Range Status   SARS Coronavirus 2 by RT PCR NEGATIVE NEGATIVE Final    Comment: (NOTE) SARS-CoV-2 target nucleic acids are NOT DETECTED. The SARS-CoV-2 RNA is generally detectable in upper respiratoy specimens during the acute phase of infection. The lowest concentration of SARS-CoV-2 viral copies this assay can detect is 131 copies/mL. A negative result does not  preclude SARS-Cov-2 infection and should not be used as the sole basis for treatment or other patient management decisions. A negative result may occur with  improper specimen collection/handling, submission of specimen other than nasopharyngeal swab, presence of viral mutation(s) within the areas targeted by this assay, and inadequate number of viral copies (<131 copies/mL). A negative result must be combined with clinical observations, patient history, and epidemiological information. The expected result is Negative. Fact Sheet for Patients:  PinkCheek.be Fact Sheet for Healthcare Providers:  GravelBags.it This test is not yet ap proved or cleared by the [place] FDA and  has been authorized for detection and/or diagnosis of SARS-CoV-2 by FDA under an Emergency Use Authorization (EUA). This EUA will remain  in effect (meaning this test can be used) for the duration of the COVID-19 declaration under Section 564(b)(1) of the Act, 21 U.S.C. section 360bbb-3(b)(1), unless the authorization is terminated or revoked sooner.    Influenza A by PCR NEGATIVE NEGATIVE Final   Influenza B by PCR NEGATIVE NEGATIVE Final    Comment: (NOTE) The Xpert Xpress SARS-CoV-2/FLU/RSV assay is intended as an aid in  the diagnosis of influenza from Nasopharyngeal swab specimens and  should not be used as a sole basis for treatment. Nasal washings  and  aspirates are unacceptable for Xpert Xpress SARS-CoV-2/FLU/RSV  testing. Fact Sheet for Patients: PinkCheek.be Fact Sheet for Healthcare Providers: GravelBags.it This test is not yet approved or cleared by the [place] FDA and  has been authorized for detection and/or diagnosis of SARS-CoV-2 by  FDA under an Emergency Use Authorization (EUA). This EUA will remain  in effect (meaning this test can be used) for the duration of the   Covid-19 declaration under Section 564(b)(1) of the Act, 21  U.S.C. section 360bbb-3(b)(1), unless the authorization is  terminated or revoked. Performed at [facility], [place] [address] [place], [place] [address]   Urine culture     Status: Abnormal   Collection Time: 01/24/20 11:55 AM   Specimen: Urine, Random  Result Value Ref Range Status   Specimen Description URINE, RANDOM  Final   Special Requests   Final    NONE Performed at [facility], [place] [address] [place], [place] [address]    Culture MULTIPLE SPECIES PRESENT, SUGGEST RECOLLECTION (A)  Final   Report Status 01/25/2020 FINAL  Final  MRSA PCR Screening     Status: None   Collection Time: 01/24/20  2:02 PM   Specimen: Nasopharyngeal  Result Value Ref Range Status   MRSA by PCR NEGATIVE NEGATIVE Final    Comment:        The GeneXpert MRSA Assay (FDA approved for NASAL specimens only), is one component of a comprehensive MRSA colonization surveillance program. It is not intended to diagnose MRSA infection nor to guide or monitor treatment for MRSA infections. Performed at [facility], [place] [address], [place], [place] [address]   SARS CORONAVIRUS 2 (TAT 6-24 HRS) Nasopharyngeal Nasopharyngeal Swab     Status: None   Collection Time: 02/01/20  3:11 PM   Specimen: Nasopharyngeal Swab  Result Value Ref Range Status   SARS Coronavirus 2 NEGATIVE NEGATIVE Final    Comment: (NOTE) SARS-CoV-2 target nucleic acids are NOT DETECTED. The SARS-CoV-2 RNA is generally detectable in upper and lower respiratory specimens during the acute phase of infection. Negative results do not preclude SARS-CoV-2 infection, do not rule out co-infections with other pathogens, and should not be used as the sole basis for treatment or other patient management decisions. Negative results must be combined with clinical observations, patient history, and epidemiological information. The expected result is Negative. Fact  Sheet for Patients: SugarRoll.be Fact Sheet for Healthcare Providers: https://www.woods-mathews.com/ This test is not yet approved or cleared by the [place] FDA and  has been authorized for detection and/or diagnosis of SARS-CoV-2 by FDA under an Emergency Use Authorization (EUA). This EUA will remain  in effect (meaning this test can be used) for the duration of the COVID-19 declaration under Section 56 4(b)(1) of the Act, 21 U.S.C. section 360bbb-3(b)(1), unless the authorization is terminated or revoked sooner. Performed at [facility], [place] [address] [place], [place] [address]     Radiology Reports DG Chest 2 View  Result Date: 01/30/2020 CLINICAL DATA:  Pleural effusions, hypoxia, fever and CHF. EXAM: CHEST - 2 VIEW COMPARISON:  01/26/2020 FINDINGS: Stable calcified thoracic aorta. Lung volumes are lower compared to the prior study. Relatively stable moderate-sized bilateral pleural effusions. Suggestion of mild pulmonary interstitial edema. No pneumothorax. IMPRESSION: Stable moderate bilateral pleural effusions. Suggestion of mild pulmonary interstitial edema. Electronically Signed   By: Aletta Edouard M.D.   On: 01/30/2020 14:39   DG Knee 1-2 Views Right  Result Date:  01/24/2020 CLINICAL DATA:  Fall EXAM: RIGHT KNEE - 1-2 VIEW COMPARISON:  None. FINDINGS: No evidence of fracture, dislocation, or joint effusion. There is diffuse osteopenia. Dense vascular calcifications are seen. Surgical clips are seen posteriorly. Soft tissue swelling seen surrounding the knee. IMPRESSION: No acute osseous abnormality. Diffuse soft tissue swelling surrounding the knee. Electronically Signed   By: Prudencio Pair M.D.   On: 01/24/2020 01:35   US RENAL  Result Date: 01/25/2020 CLINICAL DATA:  Elevated creatinine EXAM: RENAL / URINARY TRACT ULTRASOUND COMPLETE COMPARISON:  01/15/2019 FINDINGS: Right Kidney: Renal measurements: 10.2 x 4.5 x 4.9 cm. =  volume: 118 mL . Echogenicity within normal limits. No mass or hydronephrosis visualized. Left Kidney: Renal measurements: 8.4 x 4.7 x 5.0 cm. = volume: 103 mL. Kidney is only partially visualized. No mass lesion or hydronephrosis is noted. Bladder: Bladder wall is thickened with internal septations. Other: None. IMPRESSION: Normal-appearing kidneys without hydronephrosis. Bladder wall thickening which may be related incomplete distension. A focal septation is noted within the bladder of uncertain significance. Direct visualization may be helpful as necessary. Electronically Signed   By: Inez Catalina M.D.   On: 01/25/2020 22:14   DG Chest Port 1 View  Result Date: 01/31/2020 CLINICAL DATA:  Short of breath. EXAM: PORTABLE CHEST 1 VIEW COMPARISON:  01/30/2020 FINDINGS: Previous median sternotomy and CABG procedure. Moderate bilateral pleural effusions are again noted. Mild pulmonary interstitial edema. No pneumothorax. IMPRESSION: Persistent bilateral pleural effusions and mild pulmonary interstitial edema. Electronically Signed   By: Kerby Moors M.D.   On: 01/31/2020 09:45   DG CHEST PORT 1 VIEW  Result Date: 01/26/2020 CLINICAL DATA:  Patient with fever and confusion. EXAM: PORTABLE CHEST 1 VIEW COMPARISON:  Chest radiograph 01/25/2020. FINDINGS: Monitoring leads overlie the patient. Stable scattered cardiac and mediastinal contours. Aortic atherosclerosis. Moderate to large layering bilateral pleural effusions with underlying consolidation. Similar bilateral interstitial opacities. No pneumothorax. IMPRESSION: Moderate to large layering bilateral pleural effusions with underlying consolidation. Electronically Signed   By: Lovey Newcomer M.D.   On: 01/26/2020 17:44   DG Chest Port 1 View  Result Date: 01/25/2020 CLINICAL DATA:  Pleural effusion. EXAM: PORTABLE CHEST 1 VIEW COMPARISON:  January 24, 2020. FINDINGS: Stable cardiomediastinal silhouette. Atherosclerosis of thoracic aorta is noted. Stable  bilateral pleural effusions are noted, with associated bilateral pulmonary edema or atelectasis. Bony thorax is unremarkable. IMPRESSION: Stable bilateral pleural effusions are noted with associated bilateral pulmonary edema or atelectasis. Aortic Atherosclerosis (ICD10-I70.0). Electronically Signed   By: Marijo Conception M.D.   On: 01/25/2020 09:18   DG Chest Portable 1 View  Result Date: 01/24/2020 CLINICAL DATA:  Patient found on the floor. EXAM: PORTABLE CHEST 1 VIEW COMPARISON:  December 23, 2019 FINDINGS: Mild to moderate severity areas of atelectasis and/or infiltrate are seen within the bilateral lung bases. This represents a new finding when compared to the prior study. Moderate size bilateral pleural effusions are seen with a moderate amount of fluid noted within the minor fissure. This is increased in severity when compared to the prior exam. The cardiac silhouette is mildly enlarged and stable in size. Marked severity calcification of the aortic arch is seen. Multilevel degenerative changes seen throughout the thoracic spine. IMPRESSION: 1. Mild to moderate severity bibasilar atelectasis and/or infiltrate. 2. Moderate size bilateral pleural effusions with a moderate amount of fluid within the minor fissure. Electronically Signed   By: Virgina Norfolk M.D.   On: 01/24/2020 01:13   ECHOCARDIOGRAM COMPLETE  Result Date:  01/24/2020    ECHOCARDIOGRAM REPORT   Patient Name:   Gina [name] Date of Exam: 01/24/2020 Medical Rec #:  [id number]        Height:       67.0 in Accession #:    [id number]       Weight:       116.0 lb Date of Birth:  11/07/1928        BSA:          1.604 m Patient Age:    73 years         BP:           128/51 mmHg Patient Gender: F                HR:           62 bpm. Exam Location:  Inpatient Procedure: 2D Echo, Cardiac Doppler and Color Doppler Indications:    I50.33 Acute on chronic diastolic (congestive) heart failure  History:        Patient has prior history of  Echocardiogram examinations, most                 recent 09/16/2019. Risk Factors:Hypertension, Diabetes and                 Dyslipidemia. Thyroid Disease.  Sonographer:    Tiffany Dance Referring Phys: [id number] [place]  1. Left ventricular ejection fraction, by estimation, is 55%. The left ventricle has normal function. The left ventricle has no regional wall motion abnormalities. There is mild left ventricular hypertrophy. Left ventricular diastolic parameters are consistent with Grade III diastolic dysfunction (restrictive). Elevated left ventricular end-diastolic pressure.  2. Right ventricular systolic function is normal. The right ventricular size is moderately enlarged. There is mildly elevated pulmonary artery systolic pressure.  3. Left atrial size was severely dilated.  4. Right atrial size was moderately dilated.  5. Large pleural effusion.  6. The mitral valve is normal in structure. Mild mitral valve regurgitation. No evidence of mitral stenosis.  7. Tricuspid valve regurgitation is mild to moderate.  8. The aortic valve is tricuspid. Aortic valve regurgitation is not visualized. No aortic stenosis is present.  9. The inferior vena cava is dilated in size with <50% respiratory variability, suggesting right atrial pressure of 15 mmHg. Comparison(s): A prior study was performed on 09/16/2019. Prior images reviewed side by side. RV/RA size has increased, TR has increased, RA pressure has increased. Similar appearance of large pleural effusion. FINDINGS  Left Ventricle: Left ventricular ejection fraction, by estimation, is 55%. The left ventricle has normal function. The left ventricle has no regional wall motion abnormalities. The left ventricular internal cavity size was normal in size. There is mild left ventricular hypertrophy. Left ventricular diastolic parameters are consistent with Grade III diastolic dysfunction (restrictive). Elevated left ventricular end-diastolic pressure. Right  Ventricle: The right ventricular size is moderately enlarged. No increase in right ventricular wall thickness. Right ventricular systolic function is normal. There is mildly elevated pulmonary artery systolic pressure. The tricuspid regurgitant velocity is 2.62 m/s, and with an assumed right atrial pressure of 15 mmHg, the estimated right ventricular systolic pressure is 92.1 mmHg. Left Atrium: Left atrial size was severely dilated. Right Atrium: Right atrial size was moderately dilated. Pericardium: Trivial pericardial effusion is present. Mitral Valve: The mitral valve is normal in structure. Normal mobility of the mitral valve leaflets. Mild mitral valve regurgitation. No evidence of mitral valve stenosis. Tricuspid Valve: The tricuspid valve  is normal in structure. Tricuspid valve regurgitation is mild to moderate. No evidence of tricuspid stenosis. Aortic Valve: The aortic valve is tricuspid. Aortic valve regurgitation is not visualized. No aortic stenosis is present. Pulmonic Valve: The pulmonic valve was normal in structure. Pulmonic valve regurgitation is trivial. No evidence of pulmonic stenosis. Aorta: The aortic root is normal in size and structure. Venous: The inferior vena cava is dilated in size with less than 50% respiratory variability, suggesting right atrial pressure of 15 mmHg. IAS/Shunts: No atrial level shunt detected by color flow Doppler. Additional Comments: There is a large pleural effusion.  LEFT VENTRICLE PLAX 2D LVIDd:         4.90 cm  Diastology LVIDs:         3.35 cm  LV e' lateral:   5.87 cm/s LV PW:         0.88 cm  LV E/e' lateral: 14.1 LV IVS:        0.75 cm  LV e' medial:    2.98 cm/s LVOT diam:     2.00 cm  LV E/e' medial:  27.8 LV SV:         58 LV SV Index:   36 LVOT Area:     3.14 cm  RIGHT VENTRICLE            IVC RV Basal diam:  2.70 cm    IVC diam: 2.69 cm RV S prime:     9.20 cm/s TAPSE (M-mode): 1.3 cm LEFT ATRIUM             Index       RIGHT ATRIUM           Index LA  diam:        4.00 cm 2.49 cm/m  RA Area:     18.00 cm LA Vol (A2C):   96.8 ml 60.34 ml/m RA Volume:   51.50 ml  32.10 ml/m LA Vol (A4C):   56.2 ml 35.03 ml/m LA Biplane Vol: 74.1 ml 46.19 ml/m  AORTIC VALVE LVOT Vmax:   73.45 cm/s LVOT Vmean:  48.000 cm/s LVOT VTI:    0.186 m  AORTA Ao Root diam: 3.30 cm Ao Asc diam:  2.70 cm MITRAL VALVE               TRICUSPID VALVE MV Area (PHT): 4.80 cm    TR Peak grad:   27.5 mmHg MV Decel Time: 158 msec    TR Vmax:        262.00 cm/s MV E velocity: 82.90 cm/s MV A velocity: 42.20 cm/s  SHUNTS MV E/A ratio:  1.96        Systemic VTI:  0.19 m                            Systemic Diam: 2.00 cm Cherlynn Kaiser MD Electronically signed by Cherlynn Kaiser MD Signature Date/Time: 01/24/2020/5:36:14 PM    Final    VAS [place] UPPER EXTREMITY VENOUS DUPLEX  Result Date: 01/30/2020 UPPER VENOUS STUDY  Indications: Swelling Risk Factors: None identified. Limitations: Body habitus and patient positioning. Comparison Study: No prior studies. Performing Technologist: Oliver Hum RVT  Examination Guidelines: A complete evaluation includes B-mode imaging, spectral Doppler, color Doppler, and power Doppler as needed of all accessible portions of each vessel. Bilateral testing is considered an integral part of a complete examination. Limited examinations for reoccurring indications may be performed as noted.  Right Findings: +----------+------------+---------+-----------+----------+-------+  RIGHT     CompressiblePhasicitySpontaneousPropertiesSummary +----------+------------+---------+-----------+----------+-------+ IJV           Full       Yes       Yes                      +----------+------------+---------+-----------+----------+-------+ Subclavian    Full       Yes       Yes                      +----------+------------+---------+-----------+----------+-------+ Axillary      Full       Yes       Yes                       +----------+------------+---------+-----------+----------+-------+ Brachial      Full       Yes       Yes                      +----------+------------+---------+-----------+----------+-------+ Radial        Full                                          +----------+------------+---------+-----------+----------+-------+ Ulnar         Full                                          +----------+------------+---------+-----------+----------+-------+ Cephalic      Full                                          +----------+------------+---------+-----------+----------+-------+ Basilic       None                                  Chronic +----------+------------+---------+-----------+----------+-------+  Left Findings: +----------+------------+---------+-----------+----------+-------+ LEFT      CompressiblePhasicitySpontaneousPropertiesSummary +----------+------------+---------+-----------+----------+-------+ Subclavian    Full       Yes       Yes                      +----------+------------+---------+-----------+----------+-------+  Summary:  Right: No evidence of deep vein thrombosis in the upper extremity. Findings consistent with chronic superficial vein thrombosis involving the right basilic vein.  Left: No evidence of thrombosis in the subclavian.  *See table(s) above for measurements and observations.  Diagnosing physician: Monica Martinez MD Electronically signed by Monica Martinez MD on 01/30/2020 at 1:10:17 PM.    Final    US Abdomen Limited RUQ  Result Date: 01/24/2020 CLINICAL DATA:  Elevated bilirubin. EXAM: ULTRASOUND ABDOMEN LIMITED RIGHT UPPER QUADRANT COMPARISON:  None. FINDINGS: Gallbladder: Multiple the folds are seen within the gallbladder wall. Floating echogenic debris is seen within the gallbladder lumen. No gallstones or wall thickening visualized (1.8 mm). No sonographic Murphy sign noted by sonographer. Common bile duct: Diameter: 3.2 mm Liver:  No focal lesion identified. Diffusely increased echogenicity of the liver parenchyma is seen. Portal vein is patent on color Doppler imaging with normal direction of blood flow towards the liver. Other: A small right pleural effusion  is suspected. IMPRESSION: 1. Multiple gallbladder folds with gallbladder sludge. 2. No evidence of cholelithiasis or acute cholecystitis. 3. Fatty liver. 4. Small right pleural effusion. Electronically Signed   By: Virgina Norfolk M.D.   On: 01/24/2020 02:28

## 2020-02-03 LAB — GLUCOSE, CAPILLARY
Glucose-Capillary: 118 mg/dL — ABNORMAL HIGH (ref 70–99)
Glucose-Capillary: 119 mg/dL — ABNORMAL HIGH (ref 70–99)
Glucose-Capillary: 154 mg/dL — ABNORMAL HIGH (ref 70–99)

## 2020-02-03 MED ORDER — POTASSIUM CHLORIDE ER 20 MEQ PO TBCR: 20.0000 meq | EXTENDED_RELEASE_TABLET | Freq: Every day | ORAL | Status: DC

## 2020-02-03 MED ORDER — ENSURE ENLIVE PO LIQD: 237.0000 mL | Freq: Two times a day (BID) | ORAL | 12 refills | Status: AC

## 2020-02-03 MED ORDER — PRO-STAT SUGAR FREE PO LIQD: 30.0000 mL | Freq: Three times a day (TID) | ORAL | 0 refills | Status: DC

## 2020-02-03 MED ORDER — LEVOTHYROXINE SODIUM 150 MCG PO TABS: 150.0000 ug | ORAL_TABLET | Freq: Every day | ORAL | Status: DC

## 2020-02-03 MED ORDER — HYDRALAZINE HCL 100 MG PO TABS: 100.0000 mg | ORAL_TABLET | Freq: Three times a day (TID) | ORAL | Status: DC

## 2020-02-03 MED ORDER — CARVEDILOL 3.125 MG PO TABS: 3.1250 mg | ORAL_TABLET | Freq: Two times a day (BID) | ORAL | Status: DC

## 2020-02-03 NOTE — Progress Notes (Signed)
Pt refused lab work this morning. Ranelle Oyster, RN

## 2020-02-03 NOTE — Care Management Important Message (Signed)
Important Message  Patient Details  Name: Gina [name] MRN: [id number] Date of Birth: 02-24-1929   Medicare Important Message Given:  Yes - Important Message mailed due to current National Emergency  Verbal consent obtained due to current National Emergency  Relationship to patient: Self Contact Name: Dodie Parisi Call Date: 02/03/20  Time: [place] Phone: [phone] and [phone] Outcome: No Answer/Busy Important Message mailed to: Patient address on file    Delorse Lek 02/03/2020, 3:39 PM

## 2020-02-03 NOTE — Progress Notes (Signed)
[name] to be D/C'd Skilled nursing facility per MD order.  Discussed with the patient and all questions fully answered.  VSS, Skin clean, dry and intact without evidence of skin break down, no evidence of skin tears noted. IV catheter discontinued intact. Site without signs and symptoms of complications. Dressing and pressure applied.  An After Visit Summary was printed and given to the patient. Patient received prescription.  D/c education completed with patient/family including follow up instructions, medication list, d/c activities limitations if indicated, with other d/c instructions as indicated by MD - patient able to verbalize understanding, all questions fully answered.   Patient instructed to return to ED, call 911, or call MD for any changes in condition.   Patient escorted via [place], and D/C home via private auto.  Luci Bank 02/03/2020 6:37 PM

## 2020-02-03 NOTE — Progress Notes (Signed)
Physical Therapy Treatment Patient Details Name: Gina [name] MRN: [id number] DOB: 11/23/1928 Today's Date: 02/03/2020    History of Present Illness 86 year old female who sustained another fall at home after discharge from rehab on 4/15, now in acute respiratory failure, acute Hep A with liver failure, acute metabolic encephalopathy, transaminase.  Patient with AKI, FTT, CHF. PMH:  CHF, DM, HTN, PVD, HLD, CKD IIIb, anemia, breast CA s/p mastectomy,  multiple large angiodysplastic lesions with bleeding in gastric fundus/body , angiodysplastic lesion without bleeding in duodenum and jejunum.     PT Comments    Pt was seen for short gait on RW, with daughter in to assist her with transfer to SNF.  Pt is discharging to work on all rehab needs at [facility], and will continue with all therapy needs.  Follow acutely if dc is held.   Follow Up Recommendations  SNF     Equipment Recommendations  Rolling walker with 5" wheels;3in1 (PT);Wheelchair (measurements PT);Wheelchair cushion (measurements PT);Hospital bed    Recommendations for Other Services       Precautions / Restrictions Precautions Precautions: Fall Precaution Comments: monitor vitals Restrictions Weight Bearing Restrictions: No    Mobility  Bed Mobility Overal bed mobility: Needs Assistance Bed Mobility: Supine to Sit;Sit to Supine     Supine to sit: Mod assist Sit to supine: Max assist   General bed mobility comments: requires help to return legs to bed  Transfers Overall transfer level: Needs assistance Equipment used: Rolling walker (2 wheeled) Transfers: Sit to/from Stand Sit to Stand: Mod assist Stand pivot transfers: Mod assist;From elevated surface          Ambulation/Gait Ambulation/Gait assistance: Mod assist Gait Distance (Feet): 6 Feet Assistive device: Rolling walker (2 wheeled);1 person hand held assist Gait Pattern/deviations: Step-to pattern;Wide base of support     General Gait  Details: sidesteps on side of bed   Stairs             Wheelchair Mobility    Modified Rankin (Stroke Patients Only)       Balance                                            Cognition Arousal/Alertness: Awake/alert Behavior During Therapy: WFL for tasks assessed/performed Overall Cognitive Status: Impaired/Different from baseline Area of Impairment: Problem solving;Awareness;Safety/judgement;Following commands;Memory;Attention;Orientation                             Problem Solving: Requires tactile cues;Slow processing;Difficulty sequencing;Decreased initiation;Requires verbal cues General Comments: repetitive request to put on paper pants and leave for SNF      Exercises      General Comments General comments (skin integrity, edema, etc.): stable O2 sats on cannula      Pertinent Vitals/Pain Pain Assessment: Faces Faces Pain Scale: No hurt    Home Living                      Prior Function            PT Goals (current goals can now be found in the care plan section) Acute Rehab PT Goals Patient Stated Goal: get home Progress towards PT goals: Progressing toward goals    Frequency    Min 2X/week      PT Plan Current plan remains appropriate  Co-evaluation              AM-PAC PT "6 Clicks" Mobility   Outcome Measure  Help needed turning from your back to your side while in a flat bed without using bedrails?: A Little Help needed moving from lying on your back to sitting on the side of a flat bed without using bedrails?: A Lot Help needed moving to and from a bed to a chair (including a wheelchair)?: A Lot Help needed standing up from a chair using your arms (e.g., wheelchair or bedside chair)?: A Lot Help needed to walk in hospital room?: A Lot Help needed climbing 3-5 steps with a railing? : Total 6 Click Score: 12    End of Session Equipment Utilized During Treatment: Oxygen Activity  Tolerance: Patient limited by pain;Patient limited by fatigue Patient left: in bed;with bed alarm set;with call bell/phone within reach Nurse Communication: Mobility status PT Visit Diagnosis: Muscle weakness (generalized) (M62.81);History of falling (Z91.81);Difficulty in walking, not elsewhere classified (R26.2);Adult, failure to thrive (R62.7);Pain     Time: 1425-1501 PT Time Calculation (min) (ACUTE ONLY): 36 min  Charges:  $Gait Training: 8-22 mins $Therapeutic Activity: 8-22 mins            Ramond Dial 02/03/2020, 11:28 PM  Mee Hives, PT MS Acute Rehab Dept. Number: [place] [facility] [place]

## 2020-02-03 NOTE — TOC Transition Note (Addendum)
Transition of Care [facility]) - CM/SW Discharge Note   Patient Details  Name: Gina [name] MRN: [id number] Date of Birth: 1928-11-18  Transition of Care [facility] CM/SW Contact:  Benard Halsted, LCSW Phone Number: 02/03/2020, 2:42 PM   Clinical Narrative:    Patient will DC to: [facility] Anticipated DC date: 02/03/20 Family notified: Niece, [name] at bedside Transport by: Corey Harold   Per MD patient ready for DC to [facility]. RN, patient, patient's family, and facility notified of DC. Discharge Summary and FL2 sent to facility. RN to call report prior to discharge ([phone] or [place] Room 321). DC packet on chart. Ambulance transport requested for patient.   CSW will sign off for now as social work intervention is no longer needed. Please consult [place] again if new needs arise.      Final next level of care: Skilled Nursing Facility Barriers to Discharge: No Barriers Identified   Patient Goals and CMS Choice Patient states their goals for this hospitalization and ongoing recovery are:: Rehab CMS Medicare.gov Compare Post Acute Care list provided to:: Patient Represenative (must comment)(Niece, [name]) Choice offered to / list presented to : (Niece, [name])  Discharge Placement   Existing PASRR number confirmed : 02/03/20          Patient chooses bed at: [facility] Patient to be transferred to facility by: [facility] Name of family member notified: Niece, [name] Patient and family notified of of transfer: 02/03/20  Discharge Plan and Services In-house Referral: Clinical Social Work   Post Acute Care Choice: [facility]                               Social Determinants of Health (SDOH) Interventions     Readmission Risk Interventions Readmission Risk Prevention Plan 01/27/2020 04/20/2019 04/15/2019  Transportation Screening Complete Complete Complete  PCP or Specialist Appt within 3-5 Days - Complete -  [place] or [place] - Complete -   Social Work Consult for [place] Planning/Counseling - Complete -  Palliative Care Screening - Not Applicable -  Medication Review Press photographer) Complete Complete -  PCP or Specialist appointment within 3-5 days of discharge Complete - -  [place] or Home Care Consult Complete - Complete  SW Recovery Care/Counseling Consult Complete - -  Palliative Care Screening Not Applicable - Not Applicable  Skilled Nursing Facility Complete - Not Applicable  Some recent data might be hidden

## 2020-02-03 NOTE — Discharge Summary (Signed)
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 of tricuspid stenosis. Aortic Valve: The aortic  valve is tricuspid. Aortic valve regurgitation is not visualized. No aortic stenosis is present. Pulmonic Valve: The pulmonic valve was normal in structure. Pulmonic valve regurgitation is trivial. No evidence of pulmonic stenosis. Aorta: The aortic root is normal in size and structure. Venous: The inferior vena cava is dilated in size with less than 50% respiratory variability, suggesting right atrial pressure of 15 mmHg. IAS/Shunts: No atrial level shunt detected by color flow Doppler. Additional Comments: There is a large pleural effusion.  LEFT VENTRICLE PLAX 2D LVIDd:         4.90 cm  Diastology LVIDs:         3.35 cm  LV e' lateral:   5.87 cm/s LV PW:         0.88 cm  LV E/e' lateral: 14.1 LV IVS:        0.75 cm  LV e' medial:    2.98 cm/s LVOT diam:     2.00 cm  LV E/e' medial:  27.8 LV SV:         58 LV SV Index:   36 LVOT Area:     3.14 cm   RIGHT VENTRICLE            IVC RV Basal diam:  2.70 cm    IVC diam: 2.69 cm RV S prime:     9.20 cm/s TAPSE (M-mode): 1.3 cm LEFT ATRIUM             Index       RIGHT ATRIUM           Index LA diam:        4.00 cm 2.49 cm/m  RA Area:     18.00 cm LA Vol (A2C):   96.8 ml 60.34 ml/m RA Volume:   51.50 ml  32.10 ml/m LA Vol (A4C):   56.2 ml 35.03 ml/m LA Biplane Vol: 74.1 ml 46.19 ml/m  AORTIC VALVE LVOT Vmax:   73.45 cm/s LVOT Vmean:  48.000 cm/s LVOT VTI:    0.186 m  AORTA Ao Root diam: 3.30 cm Ao Asc diam:  2.70 cm MITRAL VALVE               TRICUSPID VALVE MV Area (PHT): 4.80 cm    TR Peak grad:   27.5 mmHg MV Decel Time: 158 msec    TR Vmax:        262.00 cm/s MV E velocity: 82.90 cm/s MV A velocity: 42.20 cm/s  SHUNTS MV E/A ratio:  1.96        Systemic VTI:  0.19 m                            Systemic Diam: 2.00 cm Cherlynn Kaiser MD Electronically signed by Cherlynn Kaiser MD Signature Date/Time: 01/24/2020/5:36:14 PM    Final    VAS [place] UPPER EXTREMITY VENOUS DUPLEX  Result Date: 01/30/2020 UPPER VENOUS STUDY  Indications: Swelling Risk Factors: None identified. Limitations: Body habitus and patient positioning. Comparison Study: No prior studies. Performing Technologist: Oliver Hum RVT  Examination Guidelines: A complete evaluation includes B-mode imaging, spectral Doppler, color Doppler, and power Doppler as needed of all accessible portions of each vessel. Bilateral testing is considered an integral part of a complete examination. Limited examinations for reoccurring indications may be performed as noted.  Right Findings: +----------+------------+---------+-----------+----------+-------+ RIGHT     CompressiblePhasicitySpontaneousPropertiesSummary +----------+------------+---------+-----------+----------+-------+ IJV           Full  Yes       Yes                      +----------+------------+---------+-----------+----------+-------+ Subclavian    Full       Yes       Yes                       +----------+------------+---------+-----------+----------+-------+ Axillary      Full       Yes       Yes                      +----------+------------+---------+-----------+----------+-------+ Brachial      Full       Yes       Yes                      +----------+------------+---------+-----------+----------+-------+ Radial        Full                                          +----------+------------+---------+-----------+----------+-------+ Ulnar         Full                                          +----------+------------+---------+-----------+----------+-------+ Cephalic      Full                                          +----------+------------+---------+-----------+----------+-------+ Basilic       None                                  Chronic +----------+------------+---------+-----------+----------+-------+  Left Findings: +----------+------------+---------+-----------+----------+-------+ LEFT      CompressiblePhasicitySpontaneousPropertiesSummary +----------+------------+---------+-----------+----------+-------+ Subclavian    Full       Yes       Yes                      +----------+------------+---------+-----------+----------+-------+  Summary:  Right: No evidence of deep vein thrombosis in the upper extremity. Findings consistent with chronic superficial vein thrombosis involving the right basilic vein.  Left: No evidence of thrombosis in the subclavian.  *See table(s) above for measurements and observations.  Diagnosing physician: Monica Martinez MD Electronically signed by Monica Martinez MD on 01/30/2020 at 1:10:17 PM.    Final    US Abdomen Limited RUQ  Result Date: 01/24/2020 CLINICAL DATA:  Elevated bilirubin. EXAM: ULTRASOUND ABDOMEN LIMITED RIGHT UPPER QUADRANT COMPARISON:  None. FINDINGS: Gallbladder: Multiple the folds are seen within the gallbladder wall. Floating echogenic debris is seen within the  gallbladder lumen. No gallstones or wall thickening visualized (1.8 mm). No sonographic Murphy sign noted by sonographer. Common bile duct: Diameter: 3.2 mm Liver: No focal lesion identified. Diffusely increased echogenicity of the liver parenchyma is seen. Portal vein is patent on color Doppler imaging with normal direction of blood flow towards the liver. Other: A small right pleural effusion is suspected. IMPRESSION: 1. Multiple gallbladder folds with gallbladder sludge. 2. No evidence of cholelithiasis or acute cholecystitis. 3. Fatty liver. 4. Small right pleural  effusion. Electronically Signed   By: Virgina Norfolk M.D.   On: 01/24/2020 02:28    Micro Results    Recent Results (from the past 240 hour(s))  MRSA PCR Screening     Status: None   Collection Time: 01/24/20  2:02 PM   Specimen: Nasopharyngeal  Result Value Ref Range Status   MRSA by PCR NEGATIVE NEGATIVE Final    Comment:        The GeneXpert MRSA Assay (FDA approved for NASAL specimens only), is one component of a comprehensive MRSA colonization surveillance program. It is not intended to diagnose MRSA infection nor to guide or monitor treatment for MRSA infections. Performed at [facility], [place] [address], [place], [place] [address]   SARS CORONAVIRUS 2 (TAT 6-24 HRS) Nasopharyngeal Nasopharyngeal Swab     Status: None   Collection Time: 02/01/20  3:11 PM   Specimen: Nasopharyngeal Swab  Result Value Ref Range Status   SARS Coronavirus 2 NEGATIVE NEGATIVE Final    Comment: (NOTE) SARS-CoV-2 target nucleic acids are NOT DETECTED. The SARS-CoV-2 RNA is generally detectable in upper and lower respiratory specimens during the acute phase of infection. Negative results do not preclude SARS-CoV-2 infection, do not rule out co-infections with other pathogens, and should not be used as the sole basis for treatment or other patient management decisions. Negative results must be combined with clinical  observations, patient history, and epidemiological information. The expected result is Negative. Fact Sheet for Patients: SugarRoll.be Fact Sheet for Healthcare Providers: https://www.woods-mathews.com/ This test is not yet approved or cleared by the [place] FDA and  has been authorized for detection and/or diagnosis of SARS-CoV-2 by FDA under an Emergency Use Authorization (EUA). This EUA will remain  in effect (meaning this test can be used) for the duration of the COVID-19 declaration under Section 56 4(b)(1) of the Act, 21 U.S.C. section 360bbb-3(b)(1), unless the authorization is terminated or revoked sooner. Performed at [facility], [place] [address], [place], [place] [address]        Today   Subjective:   Pansy Ostrovsky today has no headache,no chest or abdominal pain, does report generalized weakness and fatigue, .   Objective:   Blood pressure (!) 150/50, pulse 66, temperature (!) 97.3 F (36.3 C), temperature source Oral, resp. rate 18, height 5\' 7"  (1.702 m), weight 52.6 kg, SpO2 93 %.   Intake/Output Summary (Last 24 hours) at 02/03/2020 1327 Last data filed at 02/03/2020 0017 Gross per 24 hour  Intake 120 ml  Output 350 ml  Net -230 ml    Exam Awake Alert, Oriented x 3, frail, elderly, cachectic female, in no apparent distress . Symmetrical Chest wall movement, Good air movement bilaterally, CTAB RRR,No Gallops,Rubs or new Murmurs, No Parasternal Heave +ve B.Sounds, Abd Soft, Non tender, No rebound -guarding or rigidity. No Cyanosis, Clubbing or edema, No new Rash or bruise  Data Review   CBC w Diff:  Lab Results  Component Value Date   WBC 6.7 02/02/2020   HGB 9.5 (L) 02/02/2020   HGB 8.3 (L) 08/19/2019   HCT 29.8 (L) 02/02/2020   HCT 26.7 (L) 08/19/2019   PLT 177 02/02/2020   PLT 299 08/19/2019   LYMPHOPCT 13 02/02/2020   MONOPCT 9 02/02/2020   EOSPCT 1 02/02/2020   BASOPCT 0 02/02/2020     CMP:  Lab Results  Component Value Date   NA 136 02/02/2020   NA 140 10/05/2019   K 3.5 02/02/2020  CL 100 02/02/2020   CO2 27 02/02/2020   BUN 78 (H) 02/02/2020   BUN 53 (H) 10/05/2019   CREATININE 1.95 (H) 02/02/2020   CREATININE 0.96 04/13/2015   PROT 5.4 (L) 02/02/2020   PROT 6.9 10/12/2018   ALBUMIN 2.4 (L) 02/02/2020   ALBUMIN 3.8 10/12/2018   BILITOT 0.8 02/02/2020   BILITOT 0.3 10/12/2018   ALKPHOS 92 02/02/2020   AST 29 02/02/2020   ALT 88 (H) 02/02/2020  .   Total Time in preparing paper work, data evaluation and todays exam - 47 minutes  Phillips Climes M.D on 02/03/2020 at 1:27 PM  [facility]   Office  [phone]

## 2020-02-03 NOTE — TOC Progression Note (Signed)
Transition of Care [facility]) - Progression Note    Patient Details  Name: Gina [name] MRN: [id number] Date of Birth: 07/12/1929  [facility] CM/SW [place], LCSW Phone Number: 02/03/2020, 12:45 PM  Clinical Narrative:    CSW spoke with patient's niece, [name], regarding patient's discharge to [facility] today. Annette expressed appreciation and is in agreement with PTAR. She will come to visit the patient before she discharges.   Expected Discharge Plan: Skilled Nursing Facility Barriers to Discharge: Continued Medical Work up, Ship broker  Expected Discharge Plan and Services Expected Discharge Plan: [place] In-house Referral: Clinical Social Work   Post Acute Care Choice: [place] Living arrangements for the past 2 months: [place], Apartment Expected Discharge Date: 02/03/20                                     Social Determinants of Health (SDOH) Interventions    Readmission Risk Interventions Readmission Risk Prevention Plan 01/27/2020 04/20/2019 04/15/2019  Transportation Screening Complete Complete Complete  PCP or Specialist Appt within 3-5 Days - Complete -  HRI or [place] - Complete -  Social Work Consult for [place] Planning/Counseling - Complete -  Palliative Care Screening - Not Applicable -  Medication Review Press photographer) Complete Complete -  PCP or Specialist appointment within 3-5 days of discharge Complete - -  [place] or Home Care Consult Complete - Complete  SW Recovery Care/Counseling Consult Complete - -  Palliative Care Screening Not Applicable - Not Applicable  Skilled Nursing Facility Complete - Not Applicable  Some recent data might be hidden

## 2020-02-03 NOTE — Progress Notes (Signed)
Report called to [place] at [facility]

## 2020-02-03 NOTE — Discharge Instructions (Signed)
Follow with Primary MD [name], NP or SNF physician.  Get CBC, CMP,checked  by Primary MD next visit.    Activity: As tolerated with Full fall precautions use walker/cane & assistance as needed   Disposition SNF   Diet: Regular , with feeding assistance and aspiration precautions.   On your next visit with your primary care physician please Get Medicines reviewed and adjusted.   Please request your Prim.MD to go over all Hospital Tests and Procedure/Radiological results at the follow up, please get all Hospital records sent to your Prim MD by signing hospital release before you go home.   If you experience worsening of your admission symptoms, develop shortness of breath, life threatening emergency, suicidal or homicidal thoughts you must seek medical attention immediately by calling 911 or calling your MD immediately  if symptoms less severe.  You Must read complete instructions/literature along with all the possible adverse reactions/side effects for all the Medicines you take and that have been prescribed to you. Take any new Medicines after you have completely understood and accpet all the possible adverse reactions/side effects.   Do not drive, operating heavy machinery, perform activities at heights, swimming or participation in water activities or provide baby sitting services if your were admitted for syncope or siezures until you have seen by Primary MD or a Neurologist and advised to do so again.  Do not drive when taking Pain medications.    Do not take more than prescribed Pain, Sleep and Anxiety Medications  Special Instructions: If you have smoked or chewed Tobacco  in the last 2 yrs please stop smoking, stop any regular Alcohol  and or any Recreational drug use.  Wear Seat belts while driving.   Please note  You were cared for by a hospitalist during your hospital stay. If you have any questions about your discharge medications or the care you received  while you were in the hospital after you are discharged, you can call the unit and asked to speak with the hospitalist on call if the hospitalist that took care of you is not available. Once you are discharged, your primary care physician will handle any further medical issues. Please note that NO REFILLS for any discharge medications will be authorized once you are discharged, as it is imperative that you return to your primary care physician (or establish a relationship with a primary care physician if you do not have one) for your aftercare needs so that they can reassess your need for medications and monitor your lab values.

## 2020-02-04 ENCOUNTER — Non-Acute Institutional Stay (SKILLED_NURSING_FACILITY): Payer: Medicare Other | Admitting: Adult Health

## 2020-02-04 ENCOUNTER — Encounter: Payer: Self-pay | Admitting: Adult Health

## 2020-02-04 DIAGNOSIS — G9341 Metabolic encephalopathy: Secondary | ICD-10-CM

## 2020-02-04 DIAGNOSIS — N179 Acute kidney failure, unspecified: Secondary | ICD-10-CM

## 2020-02-04 DIAGNOSIS — D649 Anemia, unspecified: Secondary | ICD-10-CM | POA: Diagnosis not present

## 2020-02-04 DIAGNOSIS — I10 Essential (primary) hypertension: Secondary | ICD-10-CM

## 2020-02-04 DIAGNOSIS — I5033 Acute on chronic diastolic (congestive) heart failure: Secondary | ICD-10-CM

## 2020-02-04 DIAGNOSIS — E43 Unspecified severe protein-calorie malnutrition: Secondary | ICD-10-CM

## 2020-02-04 DIAGNOSIS — R7401 Elevation of levels of liver transaminase levels: Secondary | ICD-10-CM

## 2020-02-04 DIAGNOSIS — N184 Chronic kidney disease, stage 4 (severe): Secondary | ICD-10-CM

## 2020-02-04 DIAGNOSIS — K5909 Other constipation: Secondary | ICD-10-CM

## 2020-02-04 DIAGNOSIS — I808 Phlebitis and thrombophlebitis of other sites: Secondary | ICD-10-CM

## 2020-02-04 DIAGNOSIS — Z8719 Personal history of other diseases of the digestive system: Secondary | ICD-10-CM

## 2020-02-04 NOTE — Progress Notes (Signed)
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 +----------+------------+---------+-----------+----------+-------+ LEFT      CompressiblePhasicitySpontaneousPropertiesSummary +----------+------------+---------+-----------+----------+-------+ Subclavian    Full       Yes       Yes                      +----------+------------+---------+-----------+----------+-------+  Summary:  Right: No evidence of deep vein thrombosis in the upper extremity. Findings consistent with chronic superficial vein thrombosis involving the right basilic vein.  Left: No evidence of thrombosis in the subclavian.  *See table(s) above for measurements and observations.  Diagnosing physician: Monica Martinez MD Electronically signed by Monica Martinez MD on 01/30/2020 at 1:10:17 PM.    Final    US  Abdomen Limited RUQ  Result Date: 01/24/2020 CLINICAL DATA:  Elevated bilirubin. EXAM: ULTRASOUND ABDOMEN LIMITED RIGHT UPPER QUADRANT COMPARISON:  None. FINDINGS: Gallbladder: Multiple the folds are seen within the gallbladder wall. Floating echogenic debris is seen within the  gallbladder lumen. No gallstones or wall thickening visualized (1.8 mm). No sonographic Murphy sign noted by sonographer. Common bile duct: Diameter: 3.2 mm Liver: No focal lesion identified. Diffusely increased echogenicity of the liver parenchyma is seen. Portal vein is patent on color Doppler imaging with normal direction of blood flow towards the liver. Other: A small right pleural effusion is suspected. IMPRESSION: 1. Multiple gallbladder folds with gallbladder sludge. 2. No evidence of cholelithiasis or acute cholecystitis. 3. Fatty liver. 4. Small right pleural effusion. Electronically Signed   By: Virgina Norfolk M.D.   On: 01/24/2020 02:28    Assessment/Plan  1. Symptomatic anemia -Has history of UGI AVMs, post EGD x2, status post transfusion 1 unit PRBC -No recurrence evidence of GI bleed with stable hemoglobin since 4/26 -Will monitor -Continue ferrous sulfate 325 mg daily  2. History of GI bleed Lab Results  Component Value Date   HGB 9.5 (L) 02/02/2020   -No recurrence evidence of GI bleed with stable hemoglobin since 4/26 -Follow-up with GI, Dr. Benson Norway, in 1 month -Continue pantoprazole 40 mg 1 tab daily  3. Transaminitis Lab Results  Component Value Date   ALT 88 (H) 02/02/2020   AST 29 02/02/2020   ALKPHOS 92 02/02/2020   BILITOT 0.8 02/02/2020   -LFTs significantly elevated initially, and has normalized now, statin on hold  4. Acute metabolic encephalopathy -Continue supportive care, to minimize narcotics and benzodiazepines  5. Acute renal failure superimposed on stage 4 chronic kidney disease, unspecified acute renal failure type [facility]) Lab Results  Component Value Date   CREATININE 1.95 (H) 02/02/2020   -Baseline creatinine around 1.8, improving with diuresis  6. Essential hypertension -Stable, continue hydralazine and carvedilol  7. Thrombophlebitis arm -Right arm has chronic superficial thrombophlebitis, continue supportive care, avoid IV access in the  right arm  8. Acute on chronic diastolic CHF (congestive heart failure) (HCC) -Continue Lasix 80 mg 1 tab twice a day, carvedilol 3.125 mg 1 tab twice a day and hydralazine 100 mg 1 tab every 8 hours  9. Severe protein-calorie malnutrition Altamease Oiler: less than 60% of standard weight) (HCC) -Continue mirtazapine 15 mg 1/2 tab = 7.5 mg at bedtime to increase appetite -Continue Ensure Enlive 237 mL twice a day  10. Chronic constipation -Continue Linzess 145 mcg 1 capsule daily    Family/ staff Communication: Discussed plan of care with charge nurse.  Labs/tests ordered: CMP, CBC on 02/07/2020, TSH and free T4 in 6 weeks  Goals of care:   Short-term rehabilitation.    Durenda Age, DNP, FNP-BC [facility] [phone] (Monday-Friday 8:00 a.m. - 5:00 p.m.) [phone] (after hours)

## 2020-02-07 ENCOUNTER — Non-Acute Institutional Stay (SKILLED_NURSING_FACILITY): Payer: Medicare Other | Admitting: Internal Medicine

## 2020-02-07 ENCOUNTER — Encounter: Payer: Self-pay | Admitting: Internal Medicine

## 2020-02-07 DIAGNOSIS — N189 Chronic kidney disease, unspecified: Secondary | ICD-10-CM

## 2020-02-07 DIAGNOSIS — R7401 Elevation of levels of liver transaminase levels: Secondary | ICD-10-CM

## 2020-02-07 DIAGNOSIS — K922 Gastrointestinal hemorrhage, unspecified: Secondary | ICD-10-CM | POA: Diagnosis not present

## 2020-02-07 DIAGNOSIS — E890 Postprocedural hypothyroidism: Secondary | ICD-10-CM

## 2020-02-07 DIAGNOSIS — N179 Acute kidney failure, unspecified: Secondary | ICD-10-CM

## 2020-02-07 LAB — COMPREHENSIVE METABOLIC PANEL
Albumin: 3 — AB (ref 3.5–5.0)
Calcium: 8.7 (ref 8.7–10.7)
GFR calc Af Amer: 33.01
GFR calc non Af Amer: 28.48
Globulin: 3

## 2020-02-07 LAB — CBC: RBC: 3.44 — AB (ref 3.87–5.11)

## 2020-02-07 LAB — BASIC METABOLIC PANEL
BUN: 61 — AB (ref 4–21)
CO2: 29 — AB (ref 13–22)
Chloride: 101 (ref 99–108)
Creatinine: 1.6 — AB (ref 0.5–1.1)
Glucose: 89
Potassium: 3.9 (ref 3.4–5.3)
Sodium: 142 (ref 137–147)

## 2020-02-07 LAB — CBC AND DIFFERENTIAL
HCT: 30 — AB (ref 36–46)
Hemoglobin: 9.7 — AB (ref 12.0–16.0)
Platelets: 190 (ref 150–399)
WBC: 7.8

## 2020-02-07 LAB — HEPATIC FUNCTION PANEL
ALT: 39 — AB (ref 7–35)
AST: 20 (ref 13–35)
Alkaline Phosphatase: 98 (ref 25–125)
Bilirubin, Total: 0.4

## 2020-02-07 NOTE — Progress Notes (Signed)
NURSING HOME LOCATION:  [facility] ROOM NUMBER: [id number]  CODE STATUS: Full code  PCP: Juluis Mire, NP  This is a comprehensive admission note to [facility] performed on this date less than 30 days from date of admission. Included are preadmission medical/surgical history; reconciled medication list; family history; social history and comprehensive review of systems.  Corrections and additions to the records were documented. Comprehensive physical exam was also performed. Additionally a clinical summary was entered for each active diagnosis pertinent to this admission in the Problem List to enhance continuity of care.  HPI: Patient was hospitalized 4/25-02/03/2020 for acute GI bleeding in the context of a past history of gastric ulcer/AV malformations.  She was transfused 1 unit packed red cells. The patient lives alone and apparently was found by EMS on the bathroom floor after they broke down the door to get to her.  O2 sats were in the 80% range on room air.  Temp was 102.2.  Chest x-ray revealed pleural effusions and mild interstitial edema. The patient had also been hospitalized prior to that for symptomatic anemia and received 2 units packed red cells.  At that time EGD revealed multiple angiodysplastic lesions in the gastric fundus. Course was complicated by medical congestive heart failure and acute hepatitis A with associated elevated LFTs. TSH was 30.14 prompting increase of L-thyroxine to 150 mcg daily. Follow-up with Dr. Benson Norway, GI was to be scheduled.  TSH was to be rechecked after at least 6 weeks of the increased dosage.  Past medical and surgical history: Includes history of latent TB /status post INH, PVD, history of right breast intraductal carcinoma, essential hypertension, dyslipidemia, history of Graves' disease, diabetes with vascular complications, depression, and history of diastolic heart failure. Surgeries include hysterectomy, breast surgery for  cancer, multiple endoscopies, thoracenteses, PVD balloon angioplasty, and femoropopliteal  bypass graft x2  Social history: Nondrinker; former smoker  Family history: Reviewed   Review of systems: She clinically is slightly pleasantly confused and tends to confabulate.  She states that she "felt fine" but then went on to complain of hurting in her heels.  When I asked her the date she initially said "don't got no calendar".  Subsequently she said "May 21". She focused on having a cough while hospitalized fearful that she had pneumonia. Despite high ambient temperature of the room; she was under the covers and wearing woolen blanket across her chest.  She validates that she has cold intolerance.  Constitutional: No fever, significant weight change, fatigue  Eyes: No redness, discharge, pain, vision change ENT/mouth: No nasal congestion, purulent discharge, earache, change in hearing, sore throat  Cardiovascular: No chest pain, palpitations, paroxysmal nocturnal dyspnea, claudication, edema  Respiratory: No sputum production, hemoptysis, Gastrointestinal: No heartburn, dysphagia, abdominal pain, nausea /vomiting, rectal bleeding, melena Genitourinary: No dysuria, hematuria, pyuria, incontinence, nocturia Musculoskeletal: No joint stiffness, joint swelling, weakness, pain Dermatologic: No rash, pruritus, change in appearance of skin Neurologic: No dizziness, headache, syncope, seizures, numbness, tingling Psychiatric: No significant anxiety, depression, insomnia, anorexia Endocrine: No change in hair/skin/nails, excessive thirst, excessive hunger, excessive urination  Hematologic/lymphatic: No significant bruising, lymphadenopathy Allergy/immunology: No itchy/watery eyes, significant sneezing, urticaria, angioedema  Physical exam:  Pertinent or positive findings: She appears her stated age and somewhat cachectic.  Bilateral ptosis is present.  She has arcus senilis.  Sclera are muddy.  She has  complete dentures.  She has low-grade rales anteriorly.  Heart rate is slow and irregular.  Abdomen is slightly protuberant.  Pedal pulses are  decreased.  Feet are in booties.  Strength to opposition is fair.  She has clubbing of the nailbeds.  General appearance: no acute distress, increased work of breathing is present.   Lymphatic: No lymphadenopathy about the head, neck, axilla. Eyes: No conjunctival inflammation or lid edema is present. There is no scleral icterus. Ears:  External ear exam shows no significant lesions or deformities.   Nose:  External nasal examination shows no deformity or inflammation. Nasal mucosa are pink and moist without lesions, exudates Oral exam: Lips and gums are healthy appearing.There is no oropharyngeal erythema or exudate. Neck:  No thyromegaly, masses, tenderness noted.    Heart:  No gallop, murmur, click, rub.  Lungs:  without wheezes, rhonchi,  rubs. Abdomen: Bowel sounds are normal.  Abdomen is soft and nontender with no organomegaly, hernias, masses. GU: Deferred  Extremities:  No cyanosis, clubbing, edema. Neurologic exam:  Balance, Rhomberg, finger to nose testing could not be completed due to clinical state Skin: Warm & dry w/o tenting. No significant lesions or rash.  See clinical summary under each active problem in the Problem List with associated updated therapeutic plan

## 2020-02-07 NOTE — Assessment & Plan Note (Addendum)
02/07/2020 H/H 9.7/ 30.2.  Hemoglobin was 9.5 on 5/5. Stable to improved

## 2020-02-07 NOTE — Assessment & Plan Note (Signed)
02/07/2020 ALT 39, AST 20

## 2020-02-07 NOTE — Assessment & Plan Note (Addendum)
TSH 30.14 most likely from medication nonadherence L-thyroxine increased to 150 mcg daily

## 2020-02-07 NOTE — Patient Instructions (Signed)
See assessment and plan under each diagnosis in the problem list and acutely for this visit 

## 2020-02-07 NOTE — Assessment & Plan Note (Addendum)
Superimposed on CKD stage IIIb; creatinine improving 02/07/2020 creatinine 1.60, BUN 60.6, GFR 33 02/02/2020 creatinine 1.95 Avoid nephrotoxic drugs

## 2020-02-09 LAB — COMPREHENSIVE METABOLIC PANEL
Calcium: 8.6 — AB (ref 8.7–10.7)
GFR calc Af Amer: 31.34
GFR calc non Af Amer: 27.04

## 2020-02-09 LAB — BASIC METABOLIC PANEL
BUN: 75 — AB (ref 4–21)
CO2: 31 — AB (ref 13–22)
Chloride: 100 (ref 99–108)
Creatinine: 1.7 — AB (ref 0.5–1.1)
Glucose: 84
Potassium: 3.9 (ref 3.4–5.3)
Sodium: 143 (ref 137–147)

## 2020-02-10 ENCOUNTER — Encounter: Payer: Self-pay | Admitting: Adult Health

## 2020-02-10 ENCOUNTER — Non-Acute Institutional Stay (SKILLED_NURSING_FACILITY): Payer: Medicare Other | Admitting: Adult Health

## 2020-02-10 DIAGNOSIS — J9601 Acute respiratory failure with hypoxia: Secondary | ICD-10-CM

## 2020-02-10 DIAGNOSIS — E890 Postprocedural hypothyroidism: Secondary | ICD-10-CM

## 2020-02-10 DIAGNOSIS — R001 Bradycardia, unspecified: Secondary | ICD-10-CM | POA: Diagnosis not present

## 2020-02-10 DIAGNOSIS — I5033 Acute on chronic diastolic (congestive) heart failure: Secondary | ICD-10-CM | POA: Diagnosis not present

## 2020-02-10 DIAGNOSIS — J189 Pneumonia, unspecified organism: Secondary | ICD-10-CM | POA: Diagnosis not present

## 2020-02-10 NOTE — Progress Notes (Signed)
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                                          +----------+------------+---------+-----------+----------+-------+ Ulnar         Full                                          +----------+------------+---------+-----------+----------+-------+ Cephalic      Full                                          +----------+------------+---------+-----------+----------+-------+  Basilic       None                                  Chronic +----------+------------+---------+-----------+----------+-------+  Left Findings: +----------+------------+---------+-----------+----------+-------+ LEFT      CompressiblePhasicitySpontaneousPropertiesSummary +----------+------------+---------+-----------+----------+-------+ Subclavian    Full       Yes       Yes                      +----------+------------+---------+-----------+----------+-------+  Summary:  Right: No evidence of deep vein thrombosis in the upper extremity. Findings consistent with chronic superficial vein thrombosis involving the right basilic vein.  Left: No evidence of thrombosis in the subclavian.  *See table(s) above for measurements and  observations.  Diagnosing physician: Monica Martinez MD Electronically signed by Monica Martinez MD on 01/30/2020 at 1:10:17 PM.    Final    US Abdomen Limited RUQ  Result Date: 01/24/2020 CLINICAL DATA:  Elevated bilirubin. EXAM: ULTRASOUND ABDOMEN LIMITED RIGHT UPPER QUADRANT COMPARISON:  None. FINDINGS: Gallbladder: Multiple the folds are seen within the gallbladder wall. Floating echogenic debris is seen within the gallbladder lumen. No gallstones or wall thickening visualized (1.8 mm). No sonographic Murphy sign noted by sonographer. Common bile duct: Diameter: 3.2 mm Liver: No focal lesion identified. Diffusely increased echogenicity of the liver parenchyma is seen. Portal vein is patent on color Doppler imaging with normal direction of blood flow towards the liver. Other: A small right pleural effusion is suspected. IMPRESSION: 1. Multiple gallbladder folds with gallbladder sludge. 2. No evidence of cholelithiasis or acute cholecystitis. 3. Fatty liver. 4. Small right pleural effusion. Electronically Signed   By: Virgina Norfolk M.D.   On: 01/24/2020 02:28    Assessment/Plan  1. Pneumonia of left lower lobe due to infectious organism - has left basilar infiltrates, will start on doxycycline 100 mg twice daily x10 days and Florastor 250 mg 1 capsule twice a day x13 days  2. Acute on chronic diastolic CHF (congestive heart failure) (HCC) - will decrease Coreg fro 3.125 mg BID to 1.5625 mg BID and to continue Lasix 80 mg BID and Hydralazine 100 mg Q 8 hours -Check BNP - follow up with cardiology  3. Bradycardia - will decrease Coreg fro 3.125 mg BID to 1.5625 mg BID - follow up with Dr. Sanda Klein , cardiology  4. Postablative hypothyroidism Lab Results  Component Value Date   TSH 30.140 (H) 02/01/2020   -Continue levothyroxine 150 mcg daily  5.  Acute respiratory failure with hypoxia -Continue O2 4L/min via [place]     Family/ staff Communication: Discussed plan of care with   charge nurse  Labs/tests ordered:  BNP on 02/11/20, BMP in 1 week  Goals of care:   Short-term care    Durenda Age, DNP, FNP-BC [facility] and Adult Medicine [phone] (Monday-Friday 8:00 a.m. - 5:00 p.m.) [phone] (after hours)

## 2020-02-11 ENCOUNTER — Other Ambulatory Visit (INDEPENDENT_AMBULATORY_CARE_PROVIDER_SITE_OTHER): Payer: Self-pay | Admitting: Primary Care

## 2020-02-11 DIAGNOSIS — I10 Essential (primary) hypertension: Secondary | ICD-10-CM
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
## 2020-02-18 ENCOUNTER — Ambulatory Visit
Admission: RE | Admit: 2020-02-18 | Discharge: 2020-02-18 | Disposition: A | Discharge status: Home / Self Care | Payer: Medicare Other | Source: Ambulatory Visit | Attending: Physician Assistant | Admitting: Physician Assistant

## 2020-02-18 ENCOUNTER — Encounter: Payer: Self-pay | Admitting: Adult Health

## 2020-02-18 ENCOUNTER — Other Ambulatory Visit: Payer: Self-pay

## 2020-02-18 ENCOUNTER — Ambulatory Visit (INDEPENDENT_AMBULATORY_CARE_PROVIDER_SITE_OTHER): Payer: Medicare Other | Admitting: Physician Assistant

## 2020-02-18 ENCOUNTER — Ambulatory Visit: Payer: Medicare Other | Admitting: Physician Assistant

## 2020-02-18 ENCOUNTER — Non-Acute Institutional Stay (SKILLED_NURSING_FACILITY): Payer: Medicare Other | Admitting: Adult Health

## 2020-02-18 ENCOUNTER — Encounter: Payer: Self-pay | Admitting: Physician Assistant

## 2020-02-18 VITALS — BP 140/60 | HR 60 | Temp 96.9°F | Ht 67.0 in | Wt 118.0 lb

## 2020-02-18 DIAGNOSIS — D62 Acute posthemorrhagic anemia: Secondary | ICD-10-CM

## 2020-02-18 DIAGNOSIS — R0602 Shortness of breath: Secondary | ICD-10-CM

## 2020-02-18 DIAGNOSIS — I5033 Acute on chronic diastolic (congestive) heart failure: Secondary | ICD-10-CM | POA: Diagnosis not present

## 2020-02-18 DIAGNOSIS — J90 Pleural effusion, not elsewhere classified: Secondary | ICD-10-CM

## 2020-02-18 DIAGNOSIS — I4891 Unspecified atrial fibrillation: Secondary | ICD-10-CM

## 2020-02-18 DIAGNOSIS — E876 Hypokalemia: Secondary | ICD-10-CM

## 2020-02-18 DIAGNOSIS — E1122 Type 2 diabetes mellitus with diabetic chronic kidney disease: Secondary | ICD-10-CM

## 2020-02-18 DIAGNOSIS — I5032 Chronic diastolic (congestive) heart failure: Secondary | ICD-10-CM

## 2020-02-18 DIAGNOSIS — J189 Pneumonia, unspecified organism: Secondary | ICD-10-CM

## 2020-02-18 DIAGNOSIS — J9611 Chronic respiratory failure with hypoxia: Secondary | ICD-10-CM

## 2020-02-18 DIAGNOSIS — K922 Gastrointestinal hemorrhage, unspecified: Secondary | ICD-10-CM | POA: Diagnosis not present

## 2020-02-18 DIAGNOSIS — I10 Essential (primary) hypertension: Secondary | ICD-10-CM

## 2020-02-18 DIAGNOSIS — E890 Postprocedural hypothyroidism: Secondary | ICD-10-CM | POA: Diagnosis not present

## 2020-02-18 DIAGNOSIS — F339 Major depressive disorder, recurrent, unspecified: Secondary | ICD-10-CM

## 2020-02-18 DIAGNOSIS — N1831 Chronic kidney disease, stage 3a: Secondary | ICD-10-CM

## 2020-02-18 DIAGNOSIS — K5909 Other constipation: Secondary | ICD-10-CM

## 2020-02-18 LAB — BASIC METABOLIC PANEL
BUN/Creatinine Ratio: 49 — ABNORMAL HIGH (ref 12–28)
BUN: 85 mg/dL (ref 8–27)
CO2: 27 mmol/L (ref 20–29)
Calcium: 8.9 mg/dL (ref 8.7–10.3)
Chloride: 95 mmol/L — ABNORMAL LOW (ref 96–106)
Creatinine, Ser: 1.75 mg/dL — ABNORMAL HIGH (ref 0.57–1.00)
GFR calc Af Amer: 30 mL/min/{1.73_m2} — ABNORMAL LOW (ref >59)
GFR calc non Af Amer: 26 mL/min/{1.73_m2} — ABNORMAL LOW (ref >59)
Glucose: 113 mg/dL — ABNORMAL HIGH (ref 65–99)
Potassium: 4.4 mmol/L (ref 3.5–5.2)
Sodium: 137 mmol/L (ref 134–144)

## 2020-02-18 MED ORDER — POTASSIUM CHLORIDE ER 20 MEQ PO TBCR: 20.0000 meq | EXTENDED_RELEASE_TABLET | Freq: Every day | ORAL | 0 refills | Status: DC

## 2020-02-18 MED ORDER — FUROSEMIDE 80 MG PO TABS: 80.0000 mg | ORAL_TABLET | Freq: Two times a day (BID) | ORAL | 0 refills | Status: DC

## 2020-02-18 MED ORDER — LEVOTHYROXINE SODIUM 150 MCG PO TABS: 150.0000 ug | ORAL_TABLET | Freq: Every day | ORAL | 0 refills | Status: DC

## 2020-02-18 MED ORDER — RHOPRESSA 0.02 % OP SOLN: 1.0000 [drp] | Freq: Every day | OPHTHALMIC | 3 refills | Status: DC

## 2020-02-18 MED ORDER — PANTOPRAZOLE SODIUM 40 MG PO TBEC: 40.0000 mg | DELAYED_RELEASE_TABLET | Freq: Every day | ORAL | 0 refills | Status: DC

## 2020-02-18 MED ORDER — CARVEDILOL 3.125 MG PO TABS: 1.5625 mg | ORAL_TABLET | Freq: Two times a day (BID) | ORAL | 0 refills | Status: DC

## 2020-02-18 MED ORDER — FERROUS SULFATE 325 (65 FE) MG PO TBEC: 325.0000 mg | DELAYED_RELEASE_TABLET | Freq: Every day | ORAL | 0 refills | Status: DC

## 2020-02-18 MED ORDER — MIRTAZAPINE 15 MG PO TABS: 7.5000 mg | ORAL_TABLET | Freq: Every day | ORAL | 0 refills | Status: DC

## 2020-02-18 MED ORDER — LATANOPROST 0.005 % OP SOLN: 1.0000 [drp] | Freq: Every day | OPHTHALMIC | 0 refills | Status: DC

## 2020-02-18 MED ORDER — HYDRALAZINE HCL 100 MG PO TABS: 100.0000 mg | ORAL_TABLET | Freq: Three times a day (TID) | ORAL | 0 refills | Status: DC

## 2020-02-18 MED ORDER — LINACLOTIDE 145 MCG PO CAPS: ORAL_CAPSULE | ORAL | 3 refills | Status: DC

## 2020-02-18 MED ORDER — ALBUTEROL SULFATE HFA 108 (90 BASE) MCG/ACT IN AERS: 2.0000 | INHALATION_SPRAY | Freq: Four times a day (QID) | RESPIRATORY_TRACT | 1 refills | Status: DC | PRN

## 2020-02-18 NOTE — Patient Instructions (Signed)
Medication Instructions:   Take 80 mg of Lasix (furosemide) for 3 days. Then go back to regular dosing.  Take 20 mEq Potassium twice daily for 3 days. Then back to regular dosing.  *If you need a refill on your cardiac medications before your next appointment, please call your pharmacy*   Lab Work: Your physician recommends that you return for lab work today: BMET  If you have labs (blood work) drawn today and your tests are completely normal, you will receive your results only by: Marland Kitchen MyChart Message (if you have MyChart) OR . A paper copy in the mail If you have any lab test that is abnormal or we need to change your treatment, we will call you to review the results.   Testing/Procedures:  A chest x-ray takes a picture of the organs and structures inside the chest, including the heart, lungs, and blood vessels. This test can show several things, including, whether the heart is enlarges; whether fluid is building up in the lungs; and whether pacemaker / defibrillator leads are still in place.   Follow-Up: At [facility], you and your health needs are our priority.  As part of our continuing mission to provide you with exceptional heart care, we have created designated Provider Care Teams.  These Care Teams include your primary Cardiologist (physician) and Advanced Practice Providers (APPs -  Physician Assistants and Nurse Practitioners) who all work together to provide you with the care you need, when you need it.  We recommend signing up for the patient portal called "MyChart".  Sign up information is provided on this After Visit Summary.  MyChart is used to connect with patients for Virtual Visits (Telemedicine).  Patients are able to view lab/test results, encounter notes, upcoming appointments, etc.  Non-urgent messages can be sent to your provider as well.   To learn more about what you can do with MyChart, go to [email or web address].    Your next appointment:   Thursday, June  10 at 9:45AM   The format for your next appointment:   In Person  Provider:   Almyra Deforest, PA   Other Instructions  Gina [name] has recommended that you sit with your legs elevated as much as possible.
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
## 2020-02-19 LAB — CBC AND DIFFERENTIAL
HCT: 31 — AB (ref 36–46)
Hemoglobin: 10.1 — AB (ref 12.0–16.0)
Neutrophils Absolute: 4
Platelets: 236 (ref 150–399)
WBC: 4.9

## 2020-02-19 LAB — COMPREHENSIVE METABOLIC PANEL
Calcium: 9.2 (ref 8.7–10.7)
GFR calc Af Amer: 32.03
GFR calc non Af Amer: 27.64

## 2020-02-19 LAB — BASIC METABOLIC PANEL
BUN: 84 — AB (ref 4–21)
CO2: 28 — AB (ref 13–22)
Chloride: 98 — AB (ref 99–108)
Creatinine: 1.6 — AB (ref 0.5–1.1)
Glucose: 200
Potassium: 4.3 (ref 3.4–5.3)
Sodium: 141 (ref 137–147)

## 2020-02-19 LAB — CBC: RBC: 3.56 — AB (ref 3.87–5.11)

## 2020-02-21 ENCOUNTER — Telehealth (INDEPENDENT_AMBULATORY_CARE_PROVIDER_SITE_OTHER): Payer: Self-pay

## 2020-02-21 LAB — BASIC METABOLIC PANEL
BUN: 85 — AB (ref 4–21)
CO2: 30 — AB (ref 13–22)
Chloride: 98 — AB (ref 99–108)
Creatinine: 1.5 — AB (ref 0.5–1.1)
Glucose: 83
Potassium: 3.9 (ref 3.4–5.3)
Sodium: 140 (ref 137–147)

## 2020-02-21 LAB — COMPREHENSIVE METABOLIC PANEL
Calcium: 8.7 (ref 8.7–10.7)
GFR calc Af Amer: 35.39
GFR calc non Af Amer: 30.54

## 2020-02-21 NOTE — Telephone Encounter (Signed)
[facility]'s aide [name] called requesting a call back from PCP stating she would would like a medical status change in regards to ms. Reem.  Please advice [phone]

## 2020-02-22 NOTE — Telephone Encounter (Signed)
Sent to PCP ?

## 2020-02-24 ENCOUNTER — Telehealth: Payer: Self-pay

## 2020-02-24 NOTE — Telephone Encounter (Addendum)
Tried calling patient and could not leave a voice message for the voicemail box is full   ----- Message from Almyra Deforest, [place] sent at 02/21/2020  7:48 PM EDT ----- Stable kidney function, the plan recommended by Lauretta Chester during recent office visit is unchanged. She was only advised to increase her lasix and potassium for 3 days before going back to the previous dose.

## 2020-02-24 NOTE — Telephone Encounter (Addendum)
Tried calling patient and could not leave a voice message for the voicemail box is full   ----- Message from Almyra Deforest, [place] sent at 02/21/2020  7:38 PM EDT ----- Covering for Lauretta Chester PA-C, mild sign of volume overload consistent with Rhonda's exam in the office

## 2020-02-25 NOTE — Telephone Encounter (Signed)
Patient is in rehab

## 2020-03-01 NOTE — Addendum Note (Signed)
Addended by: Durenda Age C on: 03/01/2020 06:00 PM   Modules accepted: Level of Service

## 2020-03-03 ENCOUNTER — Encounter: Payer: Self-pay | Admitting: Adult Health

## 2020-03-03 ENCOUNTER — Non-Acute Institutional Stay (SKILLED_NURSING_FACILITY): Payer: Medicare Other | Admitting: Adult Health

## 2020-03-03 DIAGNOSIS — E890 Postprocedural hypothyroidism: Secondary | ICD-10-CM | POA: Diagnosis not present

## 2020-03-03 DIAGNOSIS — E876 Hypokalemia: Secondary | ICD-10-CM

## 2020-03-03 DIAGNOSIS — I5033 Acute on chronic diastolic (congestive) heart failure: Secondary | ICD-10-CM | POA: Diagnosis not present

## 2020-03-03 DIAGNOSIS — D62 Acute posthemorrhagic anemia: Secondary | ICD-10-CM

## 2020-03-03 DIAGNOSIS — K922 Gastrointestinal hemorrhage, unspecified: Secondary | ICD-10-CM | POA: Diagnosis not present

## 2020-03-03 DIAGNOSIS — K5909 Other constipation: Secondary | ICD-10-CM

## 2020-03-03 DIAGNOSIS — J9611 Chronic respiratory failure with hypoxia: Secondary | ICD-10-CM

## 2020-03-03 DIAGNOSIS — I10 Essential (primary) hypertension: Secondary | ICD-10-CM

## 2020-03-03 DIAGNOSIS — J189 Pneumonia, unspecified organism: Secondary | ICD-10-CM

## 2020-03-03 DIAGNOSIS — F339 Major depressive disorder, recurrent, unspecified: Secondary | ICD-10-CM

## 2020-03-03 MED ORDER — PANTOPRAZOLE SODIUM 40 MG PO TBEC: 40.0000 mg | DELAYED_RELEASE_TABLET | Freq: Every day | ORAL | 0 refills | Status: DC

## 2020-03-03 MED ORDER — ALBUTEROL SULFATE HFA 108 (90 BASE) MCG/ACT IN AERS: 2.0000 | INHALATION_SPRAY | Freq: Four times a day (QID) | RESPIRATORY_TRACT | 0 refills | Status: AC | PRN

## 2020-03-03 MED ORDER — FUROSEMIDE 20 MG PO TABS: 60.0000 mg | ORAL_TABLET | Freq: Two times a day (BID) | ORAL | 0 refills | Status: DC

## 2020-03-03 MED ORDER — RHOPRESSA 0.02 % OP SOLN: 1.0000 [drp] | Freq: Every day | OPHTHALMIC | 0 refills | Status: DC

## 2020-03-03 MED ORDER — LATANOPROST 0.005 % OP SOLN: 1.0000 [drp] | Freq: Every day | OPHTHALMIC | 0 refills | Status: DC

## 2020-03-03 MED ORDER — LINACLOTIDE 145 MCG PO CAPS: ORAL_CAPSULE | ORAL | 0 refills | Status: AC

## 2020-03-03 MED ORDER — MIRTAZAPINE 15 MG PO TABS: 7.5000 mg | ORAL_TABLET | Freq: Every day | ORAL | 0 refills | Status: AC

## 2020-03-03 MED ORDER — CARVEDILOL 3.125 MG PO TABS: 1.5625 mg | ORAL_TABLET | Freq: Two times a day (BID) | ORAL | 0 refills | Status: AC

## 2020-03-03 MED ORDER — HYDRALAZINE HCL 100 MG PO TABS: 100.0000 mg | ORAL_TABLET | Freq: Three times a day (TID) | ORAL | 0 refills | Status: AC

## 2020-03-03 MED ORDER — FERROUS SULFATE 325 (65 FE) MG PO TBEC: 325.0000 mg | DELAYED_RELEASE_TABLET | Freq: Every day | ORAL | 0 refills | Status: DC

## 2020-03-03 MED ORDER — POTASSIUM CHLORIDE CRYS ER 20 MEQ PO TBCR: 20.0000 meq | EXTENDED_RELEASE_TABLET | Freq: Every day | ORAL | 0 refills | Status: DC

## 2020-03-03 MED ORDER — LEVOTHYROXINE SODIUM 150 MCG PO TABS: 150.0000 ug | ORAL_TABLET | Freq: Every day | ORAL | 0 refills | Status: AC
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
## 2020-03-09 ENCOUNTER — Ambulatory Visit
Admission: RE | Admit: 2020-03-09 | Discharge: 2020-03-09 | Disposition: A | Discharge status: Home / Self Care | Payer: Medicare Other | Source: Ambulatory Visit | Attending: Physician Assistant | Admitting: Physician Assistant

## 2020-03-09 ENCOUNTER — Other Ambulatory Visit: Payer: Self-pay

## 2020-03-09 ENCOUNTER — Telehealth: Payer: Self-pay | Admitting: Internal Medicine

## 2020-03-09 ENCOUNTER — Encounter: Payer: Self-pay | Admitting: Physician Assistant

## 2020-03-09 ENCOUNTER — Ambulatory Visit (INDEPENDENT_AMBULATORY_CARE_PROVIDER_SITE_OTHER): Payer: Medicare Other | Admitting: Physician Assistant

## 2020-03-09 ENCOUNTER — Other Ambulatory Visit: Payer: Self-pay | Admitting: Physician Assistant

## 2020-03-09 ENCOUNTER — Telehealth (INDEPENDENT_AMBULATORY_CARE_PROVIDER_SITE_OTHER): Payer: Self-pay

## 2020-03-09 VITALS — BP 168/62 | HR 68 | Ht 66.0 in | Wt 118.0 lb

## 2020-03-09 DIAGNOSIS — J90 Pleural effusion, not elsewhere classified: Secondary | ICD-10-CM

## 2020-03-09 DIAGNOSIS — R0600 Dyspnea, unspecified: Secondary | ICD-10-CM

## 2020-03-09 DIAGNOSIS — I739 Peripheral vascular disease, unspecified: Secondary | ICD-10-CM

## 2020-03-09 DIAGNOSIS — I10 Essential (primary) hypertension: Secondary | ICD-10-CM

## 2020-03-09 DIAGNOSIS — N183 Chronic kidney disease, stage 3 unspecified: Secondary | ICD-10-CM

## 2020-03-09 DIAGNOSIS — D509 Iron deficiency anemia, unspecified: Secondary | ICD-10-CM

## 2020-03-09 LAB — CBC
Hematocrit: 30.3 % — ABNORMAL LOW (ref 34.0–46.6)
Hemoglobin: 9.6 g/dL — ABNORMAL LOW (ref 11.1–15.9)
MCH: 28.8 pg (ref 26.6–33.0)
MCHC: 31.7 g/dL (ref 31.5–35.7)
MCV: 91 fL (ref 79–97)
Platelets: 256 10*3/uL (ref 150–450)
RBC: 3.33 x10E6/uL — ABNORMAL LOW (ref 3.77–5.28)
RDW: 17.4 % — ABNORMAL HIGH (ref 11.7–15.4)
WBC: 7.8 10*3/uL (ref 3.4–10.8)

## 2020-03-09 LAB — BASIC METABOLIC PANEL
BUN/Creatinine Ratio: 45 — ABNORMAL HIGH (ref 12–28)
BUN: 78 mg/dL (ref 8–27)
CO2: 30 mmol/L — ABNORMAL HIGH (ref 20–29)
Calcium: 8.7 mg/dL (ref 8.7–10.3)
Chloride: 98 mmol/L (ref 96–106)
Creatinine, Ser: 1.72 mg/dL — ABNORMAL HIGH (ref 0.57–1.00)
GFR calc Af Amer: 30 mL/min/{1.73_m2} — ABNORMAL LOW (ref >59)
GFR calc non Af Amer: 26 mL/min/{1.73_m2} — ABNORMAL LOW (ref >59)
Glucose: 159 mg/dL — ABNORMAL HIGH (ref 65–99)
Potassium: 4.5 mmol/L (ref 3.5–5.2)
Sodium: 141 mmol/L (ref 134–144)

## 2020-03-09 NOTE — Telephone Encounter (Signed)
Deatra Canter, RN with [facility] called to request verbal orders for PT/OT twice a week for 9 weeks. Se also reported that patient has a necrotic unstageable pressure wound on the right heel. She also has a stage 2 pressure ulcer on the right heel. She will be receiving wound care. The family should also provide wound care to patient once a week. CMA provided verbal orders. Nat Christen, CMA

## 2020-03-09 NOTE — Telephone Encounter (Signed)
New message   Per niece takes 3 lasix per day twice daily. Please call to discuss.
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
## 2020-03-09 NOTE — Patient Instructions (Signed)
Medication Instructions:  Your physician recommends that you continue on your current medications as directed. Please refer to the Current Medication list given to you today.  *If you need a refill on your cardiac medications before your next appointment, please call your pharmacy*  Lab Work: Your physician recommends that you return for lab work TODAY:   BMET  CBC  If you have labs (blood work) drawn today and your tests are completely normal, you will receive your results only by: Marland Kitchen MyChart Message (if you have MyChart) OR . A paper copy in the mail If you have any lab test that is abnormal or we need to change your treatment, we will call you to review the results.  Testing/Procedures: A chest x-ray takes a picture of the organs and structures inside the chest, including the heart, lungs, and blood vessels. This test can show several things, including, whether the heart is enlarges; whether fluid is building up in the lungs; and whether pacemaker / defibrillator leads are still in place. This is done at [place] [address]  Follow-Up: At [facility], you and your health needs are our priority.  As part of our continuing mission to provide you with exceptional heart care, we have created designated Provider Care Teams.  These Care Teams include your primary Cardiologist (physician) and Advanced Practice Providers (APPs -  Physician Assistants and Nurse Practitioners) who all work together to provide you with the care you need, when you need it.  Your next appointment:   6 week(s) 3 month(s)  The format for your next appointment:   In Person In Person  Provider:   Almyra Deforest, PA-C Minus Breeding, MD  Other Instructions

## 2020-03-09 NOTE — Progress Notes (Signed)
Spoke with her niece who confirmed she is on 60mg  BID of lasix, given the fact she appears to be slightly dry, I recommended decrease the lasix to 60mg  BID for Monday through Friday, and reduce lasix to 40mg  BID for the Saturday and Sunday only.

## 2020-03-09 NOTE — Progress Notes (Signed)
Please call the daughter and find out the dosage of diuretic the patient is on, whether she is on 80mg  BID or 60mg  BID

## 2020-03-10 ENCOUNTER — Ambulatory Visit (INDEPENDENT_AMBULATORY_CARE_PROVIDER_SITE_OTHER): Payer: Medicare Other | Admitting: Primary Care

## 2020-03-10 ENCOUNTER — Telehealth (INDEPENDENT_AMBULATORY_CARE_PROVIDER_SITE_OTHER): Payer: Self-pay | Admitting: Primary Care

## 2020-03-10 NOTE — Telephone Encounter (Signed)
[name] from [facility] called requesting verbal orders for home health OT.   1 week 1 2 week 1 1 week 1 2 week 2  Please f/u

## 2020-03-10 NOTE — Telephone Encounter (Signed)
Verbal given 

## 2020-03-10 NOTE — Telephone Encounter (Signed)
Left message to call back  

## 2020-03-11 ENCOUNTER — Encounter: Payer: Self-pay | Admitting: Physician Assistant

## 2020-03-14 ENCOUNTER — Telehealth: Payer: Self-pay | Admitting: Cardiology

## 2020-03-14 DIAGNOSIS — I5033 Acute on chronic diastolic (congestive) heart failure: Secondary | ICD-10-CM

## 2020-03-14 MED ORDER — FUROSEMIDE 20 MG PO TABS: 60.0000 mg | ORAL_TABLET | Freq: Two times a day (BID) | ORAL | 0 refills | Status: DC

## 2020-03-14 NOTE — Telephone Encounter (Signed)
Medication list and last OV faxed to number provided attn Marykay Lex.  Verbally reviewed medication list with Dyann Ruddle.

## 2020-03-14 NOTE — Telephone Encounter (Signed)
Dyann Ruddle, patients nurse case manager, is requesting a nurse to go over this patients medication list with her. She will also need a list faxed to [phone] ATTN: Marykay Lex

## 2020-03-15 ENCOUNTER — Encounter (INDEPENDENT_AMBULATORY_CARE_PROVIDER_SITE_OTHER): Payer: Self-pay | Admitting: Primary Care

## 2020-03-15 ENCOUNTER — Ambulatory Visit (INDEPENDENT_AMBULATORY_CARE_PROVIDER_SITE_OTHER): Payer: Medicare Other | Admitting: Primary Care

## 2020-03-15 ENCOUNTER — Other Ambulatory Visit: Payer: Self-pay

## 2020-03-15 VITALS — BP 165/58 | HR 57 | Temp 97.5°F | Ht 66.0 in

## 2020-03-15 DIAGNOSIS — Z09 Encounter for follow-up examination after completed treatment for conditions other than malignant neoplasm: Secondary | ICD-10-CM

## 2020-03-15 DIAGNOSIS — T1490XD Injury, unspecified, subsequent encounter: Secondary | ICD-10-CM

## 2020-03-15 DIAGNOSIS — J9601 Acute respiratory failure with hypoxia: Secondary | ICD-10-CM

## 2020-03-15 DIAGNOSIS — D638 Anemia in other chronic diseases classified elsewhere: Secondary | ICD-10-CM
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
## 2020-03-15 NOTE — Telephone Encounter (Signed)
Spoke with Gina [name], she confirmed the current recommendations regarding furosemide dose after last office visit.

## 2020-03-16 ENCOUNTER — Telehealth (INDEPENDENT_AMBULATORY_CARE_PROVIDER_SITE_OTHER): Payer: Self-pay

## 2020-03-16 NOTE — Telephone Encounter (Signed)
Received call from Sonia Baller, physical therapist with [facility]. She requested verbal orders for home health physical therapy for patient twice a week for 6 weeks and then once a week for 2 weeks. Verbal provided. Nat Christen, CMA

## 2020-03-17 ENCOUNTER — Ambulatory Visit (INDEPENDENT_AMBULATORY_CARE_PROVIDER_SITE_OTHER): Payer: Medicare Other | Admitting: Primary Care

## 2020-03-24 ENCOUNTER — Other Ambulatory Visit: Payer: Self-pay

## 2020-03-24 DIAGNOSIS — J90 Pleural effusion, not elsewhere classified: Secondary | ICD-10-CM

## 2020-03-28 ENCOUNTER — Telehealth (INDEPENDENT_AMBULATORY_CARE_PROVIDER_SITE_OTHER): Payer: Self-pay

## 2020-03-28 NOTE — Telephone Encounter (Signed)
Anissa Nurse from [facility] called in regards to patient stating she weighs about 90 lbs but feels it could be less then that since they were helping her stand. Anissa states she asked ms. Doffing is she was eating and drinking and stated she was but Anissa feels like she is not. Anissa states her weight on 03-16-2020 was 113 lbs and it decreased in a matter of two weeks. Anissa states the patient is still having left heel pain and was able to obtain a order to give ms. Kyllo tylenol and ibuprofen every 4 and 8 hours alternating medication but feels like patient is not receiving medication.   Please advice Anissa with any questions or concerns [phone]

## 2020-03-28 NOTE — Telephone Encounter (Signed)
Noted heart broken

## 2020-03-29 ENCOUNTER — Ambulatory Visit
Admission: RE | Admit: 2020-03-29 | Discharge: 2020-03-29 | Disposition: A | Discharge status: Home / Self Care | Payer: Medicare Other | Source: Ambulatory Visit | Attending: Physician Assistant | Admitting: Physician Assistant

## 2020-03-29 ENCOUNTER — Other Ambulatory Visit: Payer: Self-pay | Admitting: Physician Assistant

## 2020-03-29 DIAGNOSIS — J90 Pleural effusion, not elsewhere classified: Secondary | ICD-10-CM

## 2020-03-30 ENCOUNTER — Telehealth: Payer: Self-pay

## 2020-03-30 NOTE — Telephone Encounter (Signed)
This CM spoke to Juluis Mire, NP regarding supportive services for patient to allow her to remain in her home.  Ms Oletta Lamas will speak to the family about [facility].  Also discussed the option of making a referral to [facility] for MSW as well as possible referral to CAP but there can be a 6 month wait for CAP services.  Another option is to compete a request for additional PCS hours due to a change in patient's medical status.

## 2020-04-04 ENCOUNTER — Other Ambulatory Visit: Payer: Self-pay | Admitting: Physician Assistant

## 2020-04-04 MED ORDER — DOXYCYCLINE MONOHYDRATE 100 MG PO TABS: 100.0000 mg | ORAL_TABLET | Freq: Two times a day (BID) | ORAL | 0 refills | Status: DC

## 2020-04-10 ENCOUNTER — Telehealth (INDEPENDENT_AMBULATORY_CARE_PROVIDER_SITE_OTHER): Payer: Self-pay | Admitting: Primary Care

## 2020-04-10 NOTE — Telephone Encounter (Signed)
Home Health Verbal Orders - Caller/Agency: Josph Macho  Callback Number: [phone] Requesting OT/PT/Skilled Nursing/Social Work/Speech Therapy: PT   Shanon Brow called reporting that pt has reached a plateau with her home health therapy. Shanon Brow is requesting to discontinue her orders. Please advise  [phone]

## 2020-04-11 NOTE — Telephone Encounter (Signed)
Please f/u  Copied from [place] [id number]. Topic: General - Other >> Apr 11, 2020  9:07 AM Yvette Rack wrote: Reason for CRM: Amy Pope with [facility] called to report patient is not eating and the caregiver reports patient sleeps 90% of the time. Amy stated she was out on yesterday and patient slept the entire time of her visit. Amy also stated patient has an infection on her heal and she would like to request an antibiotic as well as a referral for hospice. Cb# [phone]

## 2020-04-11 NOTE — Telephone Encounter (Signed)
Amy Pope with [facility] called to report patient is not eating and Shanon Brow requesting verbal orders for therapy given. Would like to keep HHN until placed in a nursing facility. Patient is on abt's started by cardiology fluid in her lungs

## 2020-04-11 NOTE — Telephone Encounter (Signed)
Sent to PCP on high alert.

## 2020-04-17 ENCOUNTER — Telehealth: Payer: Self-pay | Admitting: Primary Care

## 2020-04-17 NOTE — Telephone Encounter (Signed)
Bertram, LPN is calling to report that the patient was on doxycycline (ADOXA) 100 MG tablet [[id number]] for 5 days picked up on 04/07/20 prescribed by Dr. Eulas Post Wanted Sharyn Lull to know. Please advise Cb- [phone]

## 2020-04-17 NOTE — Telephone Encounter (Signed)
Copied from [place] [phone]. Topic: General - Other >> Apr 17, 2020 12:00 PM Wynetta Emery, [place] C wrote: Reason for CRM: pt's family member [name] is calling in to follow up on FL2 form being faxed to facility for pt. Please advise on status   CB: [phone]- [name]

## 2020-04-17 NOTE — Telephone Encounter (Signed)
Copied from [place] [id number]. Topic: General - Other >> Apr 17, 2020  1:06 PM Oneta Rack wrote: Osvaldo Human name: Anieca  Relation to pt: Home Health Nurse from [place]  Call back number: [phone]  Reason for call:  Home Health Nurse following up with PCP regarding today home visit, patient BP 110/50  HR 50 , Oxygen SAT is 100% at 3 liters, poor appetite, feeling weak  and should medications that was  previously discussed be placed on hold or restart.  Also checking on the status if forms were received for nursing home placement. Nurse would like a follow up call today

## 2020-04-18 ENCOUNTER — Emergency Department (HOSPITAL_COMMUNITY): Payer: Medicare Other

## 2020-04-18 ENCOUNTER — Other Ambulatory Visit: Payer: Self-pay

## 2020-04-18 ENCOUNTER — Inpatient Hospital Stay (HOSPITAL_COMMUNITY)
Admission: EM | Admit: 2020-04-18 | Discharge: 2020-04-23 | DRG: 637 | Disposition: A | Discharge status: Hospice | Payer: Medicare Other | Attending: Internal Medicine | Admitting: Internal Medicine

## 2020-04-18 ENCOUNTER — Encounter (HOSPITAL_COMMUNITY): Payer: Self-pay | Admitting: Emergency Medicine

## 2020-04-18 DIAGNOSIS — E11621 Type 2 diabetes mellitus with foot ulcer: Secondary | ICD-10-CM | POA: Diagnosis present

## 2020-04-18 DIAGNOSIS — I130 Hypertensive heart and chronic kidney disease with heart failure and stage 1 through stage 4 chronic kidney disease, or unspecified chronic kidney disease: Secondary | ICD-10-CM | POA: Diagnosis present

## 2020-04-18 DIAGNOSIS — F322 Major depressive disorder, single episode, severe without psychotic features: Secondary | ICD-10-CM | POA: Diagnosis present

## 2020-04-18 DIAGNOSIS — Z9079 Acquired absence of other genital organ(s): Secondary | ICD-10-CM

## 2020-04-18 DIAGNOSIS — E1169 Type 2 diabetes mellitus with other specified complication: Principal | ICD-10-CM | POA: Diagnosis present

## 2020-04-18 DIAGNOSIS — N184 Chronic kidney disease, stage 4 (severe): Secondary | ICD-10-CM | POA: Diagnosis present

## 2020-04-18 DIAGNOSIS — G9341 Metabolic encephalopathy: Secondary | ICD-10-CM | POA: Diagnosis present

## 2020-04-18 DIAGNOSIS — Z7189 Other specified counseling: Secondary | ICD-10-CM

## 2020-04-18 DIAGNOSIS — R627 Adult failure to thrive: Secondary | ICD-10-CM | POA: Diagnosis present

## 2020-04-18 DIAGNOSIS — Z79899 Other long term (current) drug therapy: Secondary | ICD-10-CM

## 2020-04-18 DIAGNOSIS — Z9981 Dependence on supplemental oxygen: Secondary | ICD-10-CM

## 2020-04-18 DIAGNOSIS — R001 Bradycardia, unspecified: Secondary | ICD-10-CM | POA: Diagnosis present

## 2020-04-18 DIAGNOSIS — G9340 Encephalopathy, unspecified: Secondary | ICD-10-CM

## 2020-04-18 DIAGNOSIS — D509 Iron deficiency anemia, unspecified: Secondary | ICD-10-CM | POA: Diagnosis present

## 2020-04-18 DIAGNOSIS — Z90722 Acquired absence of ovaries, bilateral: Secondary | ICD-10-CM

## 2020-04-18 DIAGNOSIS — L89620 Pressure ulcer of left heel, unstageable: Secondary | ICD-10-CM | POA: Diagnosis present

## 2020-04-18 DIAGNOSIS — Z90710 Acquired absence of both cervix and uterus: Secondary | ICD-10-CM

## 2020-04-18 DIAGNOSIS — D62 Acute posthemorrhagic anemia: Secondary | ICD-10-CM | POA: Diagnosis present

## 2020-04-18 DIAGNOSIS — E1122 Type 2 diabetes mellitus with diabetic chronic kidney disease: Secondary | ICD-10-CM | POA: Diagnosis present

## 2020-04-18 DIAGNOSIS — Z79890 Hormone replacement therapy (postmenopausal): Secondary | ICD-10-CM

## 2020-04-18 DIAGNOSIS — I5032 Chronic diastolic (congestive) heart failure: Secondary | ICD-10-CM | POA: Diagnosis present

## 2020-04-18 DIAGNOSIS — M869 Osteomyelitis, unspecified: Secondary | ICD-10-CM | POA: Diagnosis present

## 2020-04-18 DIAGNOSIS — I440 Atrioventricular block, first degree: Secondary | ICD-10-CM | POA: Diagnosis present

## 2020-04-18 DIAGNOSIS — Z9011 Acquired absence of right breast and nipple: Secondary | ICD-10-CM | POA: Diagnosis not present

## 2020-04-18 DIAGNOSIS — Z681 Body mass index (BMI) 19 or less, adult: Secondary | ICD-10-CM | POA: Diagnosis not present

## 2020-04-18 DIAGNOSIS — Z66 Do not resuscitate: Secondary | ICD-10-CM | POA: Diagnosis present

## 2020-04-18 DIAGNOSIS — J9611 Chronic respiratory failure with hypoxia: Secondary | ICD-10-CM | POA: Diagnosis present

## 2020-04-18 DIAGNOSIS — I96 Gangrene, not elsewhere classified: Secondary | ICD-10-CM

## 2020-04-18 DIAGNOSIS — Z87891 Personal history of nicotine dependence: Secondary | ICD-10-CM

## 2020-04-18 DIAGNOSIS — E43 Unspecified severe protein-calorie malnutrition: Secondary | ICD-10-CM | POA: Diagnosis present

## 2020-04-18 DIAGNOSIS — Z515 Encounter for palliative care: Secondary | ICD-10-CM | POA: Diagnosis not present

## 2020-04-18 DIAGNOSIS — E785 Hyperlipidemia, unspecified: Secondary | ICD-10-CM | POA: Diagnosis present

## 2020-04-18 DIAGNOSIS — D631 Anemia in chronic kidney disease: Secondary | ICD-10-CM | POA: Diagnosis present

## 2020-04-18 DIAGNOSIS — Z8615 Personal history of latent tuberculosis infection: Secondary | ICD-10-CM

## 2020-04-18 DIAGNOSIS — Z20822 Contact with and (suspected) exposure to covid-19: Secondary | ICD-10-CM | POA: Diagnosis present

## 2020-04-18 DIAGNOSIS — R54 Age-related physical debility: Secondary | ICD-10-CM | POA: Diagnosis present

## 2020-04-18 DIAGNOSIS — D519 Vitamin B12 deficiency anemia, unspecified: Secondary | ICD-10-CM | POA: Diagnosis present

## 2020-04-18 DIAGNOSIS — M86272 Subacute osteomyelitis, left ankle and foot: Secondary | ICD-10-CM | POA: Diagnosis not present

## 2020-04-18 DIAGNOSIS — I4891 Unspecified atrial fibrillation: Secondary | ICD-10-CM | POA: Diagnosis present

## 2020-04-18 DIAGNOSIS — K559 Vascular disorder of intestine, unspecified: Secondary | ICD-10-CM | POA: Diagnosis present

## 2020-04-18 DIAGNOSIS — Z8049 Family history of malignant neoplasm of other genital organs: Secondary | ICD-10-CM

## 2020-04-18 DIAGNOSIS — E1152 Type 2 diabetes mellitus with diabetic peripheral angiopathy with gangrene: Secondary | ICD-10-CM | POA: Diagnosis present

## 2020-04-18 DIAGNOSIS — Z8673 Personal history of transient ischemic attack (TIA), and cerebral infarction without residual deficits: Secondary | ICD-10-CM

## 2020-04-18 DIAGNOSIS — E890 Postprocedural hypothyroidism: Secondary | ICD-10-CM | POA: Diagnosis present

## 2020-04-18 DIAGNOSIS — L89896 Pressure-induced deep tissue damage of other site: Secondary | ICD-10-CM | POA: Diagnosis present

## 2020-04-18 DIAGNOSIS — Z853 Personal history of malignant neoplasm of breast: Secondary | ICD-10-CM

## 2020-04-18 DIAGNOSIS — T7401XA Adult neglect or abandonment, confirmed, initial encounter: Secondary | ICD-10-CM

## 2020-04-18 LAB — COMPREHENSIVE METABOLIC PANEL
ALT: 12 U/L (ref 0–44)
AST: 22 U/L (ref 15–41)
Albumin: 2.2 g/dL — ABNORMAL LOW (ref 3.5–5.0)
Alkaline Phosphatase: 50 U/L (ref 38–126)
Anion gap: 8 (ref 5–15)
BUN: 97 mg/dL — ABNORMAL HIGH (ref 8–23)
CO2: 29 mmol/L (ref 22–32)
Calcium: 8.4 mg/dL — ABNORMAL LOW (ref 8.9–10.3)
Chloride: 103 mmol/L (ref 98–111)
Creatinine, Ser: 2.44 mg/dL — ABNORMAL HIGH (ref 0.44–1.00)
GFR calc Af Amer: 20 mL/min — ABNORMAL LOW (ref >60)
GFR calc non Af Amer: 17 mL/min — ABNORMAL LOW (ref >60)
Glucose, Bld: 105 mg/dL — ABNORMAL HIGH (ref 70–99)
Potassium: 4.7 mmol/L (ref 3.5–5.1)
Sodium: 140 mmol/L (ref 135–145)
Total Bilirubin: 0.8 mg/dL (ref 0.3–1.2)
Total Protein: 6.4 g/dL — ABNORMAL LOW (ref 6.5–8.1)

## 2020-04-18 LAB — CBC WITH DIFFERENTIAL/PLATELET
Abs Immature Granulocytes: 0.03 10*3/uL (ref 0.00–0.07)
Basophils Absolute: 0 10*3/uL (ref 0.0–0.1)
Basophils Relative: 1 %
Eosinophils Absolute: 0.1 10*3/uL (ref 0.0–0.5)
Eosinophils Relative: 2 %
HCT: 23 % — ABNORMAL LOW (ref 36.0–46.0)
Hemoglobin: 7 g/dL — ABNORMAL LOW (ref 12.0–15.0)
Immature Granulocytes: 1 %
Lymphocytes Relative: 15 %
Lymphs Abs: 0.8 10*3/uL (ref 0.7–4.0)
MCH: 31 pg (ref 26.0–34.0)
MCHC: 30.4 g/dL (ref 30.0–36.0)
MCV: 101.8 fL — ABNORMAL HIGH (ref 80.0–100.0)
Monocytes Absolute: 0.3 10*3/uL (ref 0.1–1.0)
Monocytes Relative: 6 %
Neutro Abs: 4.1 10*3/uL (ref 1.7–7.7)
Neutrophils Relative %: 75 %
Platelets: 268 10*3/uL (ref 150–400)
RBC: 2.26 MIL/uL — ABNORMAL LOW (ref 3.87–5.11)
RDW: 15.4 % (ref 11.5–15.5)
WBC: 5.4 10*3/uL (ref 4.0–10.5)
nRBC: 0 % (ref 0.0–0.2)

## 2020-04-18 LAB — LACTIC ACID, PLASMA
Lactic Acid, Venous: 1.3 mmol/L (ref 0.5–1.9)
Lactic Acid, Venous: 0.9 mmol/L (ref 0.5–1.9)

## 2020-04-18 LAB — TROPONIN I (HIGH SENSITIVITY)
Troponin I (High Sensitivity): 35 ng/L — ABNORMAL HIGH (ref <18)
Troponin I (High Sensitivity): 32 ng/L — ABNORMAL HIGH (ref <18)

## 2020-04-18 LAB — SARS CORONAVIRUS 2 BY RT PCR (HOSPITAL ORDER, PERFORMED IN ~~LOC~~ HOSPITAL LAB): SARS Coronavirus 2: NEGATIVE

## 2020-04-18 LAB — PREPARE RBC (CROSSMATCH)

## 2020-04-18 LAB — BRAIN NATRIURETIC PEPTIDE: B Natriuretic Peptide: 2043.5 pg/mL — ABNORMAL HIGH (ref 0.0–100.0)

## 2020-04-18 MED ORDER — HEPARIN SODIUM (PORCINE) 5000 UNIT/ML IJ SOLN: 5000.0000 [IU] | Freq: Three times a day (TID) | INTRAMUSCULAR | Status: DC

## 2020-04-18 MED ORDER — HYDRALAZINE HCL 50 MG PO TABS
100.0000 mg | ORAL_TABLET | Freq: Three times a day (TID) | ORAL | Status: DC
  Administered 2020-04-19 – 2020-04-23 (×13): 100 mg via ORAL
  Filled 2020-04-18 (×14): qty 2

## 2020-04-18 MED ORDER — ACETAMINOPHEN 325 MG PO TABS
650.0000 mg | ORAL_TABLET | Freq: Four times a day (QID) | ORAL | Status: DC | PRN
  Administered 2020-04-19: 650 mg via ORAL
  Filled 2020-04-18: qty 2

## 2020-04-18 MED ORDER — MIRTAZAPINE 15 MG PO TABS
7.5000 mg | ORAL_TABLET | Freq: Every day | ORAL | Status: DC
  Administered 2020-04-19 – 2020-04-22 (×4): 7.5 mg via ORAL
  Filled 2020-04-18 (×5): qty 1

## 2020-04-18 MED ORDER — VITAMIN B-12 1000 MCG PO TABS
500.0000 ug | ORAL_TABLET | Freq: Every day | ORAL | Status: DC
  Administered 2020-04-19 – 2020-04-22 (×4): 500 ug via ORAL
  Filled 2020-04-18 (×4): qty 1

## 2020-04-18 MED ORDER — VANCOMYCIN HCL IN DEXTROSE 1-5 GM/200ML-% IV SOLN
1000.0000 mg | Freq: Once | INTRAVENOUS | Status: AC
  Administered 2020-04-18: 1000 mg via INTRAVENOUS
  Filled 2020-04-18: qty 200

## 2020-04-18 MED ORDER — FERROUS SULFATE 325 (65 FE) MG PO TABS
325.0000 mg | ORAL_TABLET | Freq: Every day | ORAL | Status: DC
  Administered 2020-04-19 – 2020-04-22 (×4): 325 mg via ORAL
  Filled 2020-04-18 (×4): qty 1

## 2020-04-18 MED ORDER — SODIUM CHLORIDE 0.9 % IV BOLUS
500.0000 mL | Freq: Once | INTRAVENOUS | Status: AC
  Administered 2020-04-18: 500 mL via INTRAVENOUS

## 2020-04-18 MED ORDER — ACETAMINOPHEN 650 MG RE SUPP: 650.0000 mg | Freq: Four times a day (QID) | RECTAL | Status: DC | PRN

## 2020-04-18 MED ORDER — SODIUM CHLORIDE 0.9% IV SOLUTION: Freq: Once | INTRAVENOUS | Status: AC

## 2020-04-18 MED ORDER — LEVOTHYROXINE SODIUM 75 MCG PO TABS
150.0000 ug | ORAL_TABLET | Freq: Every day | ORAL | Status: DC
  Administered 2020-04-19 – 2020-04-23 (×4): 150 ug via ORAL
  Filled 2020-04-18 (×4): qty 2

## 2020-04-18 NOTE — ED Notes (Signed)
Dinner Tray Ordered @ 1736. 
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
## 2020-04-18 NOTE — ED Triage Notes (Signed)
Pt here from home with [facility] w/co left heel pain and AMS on and off x63month. Pt A&O x1 pt is brady in the 40s. 3l [place] [facility]. Just finished abx on Sunday. Pt and family wants her to go to [facility] bc they are unable to care for her at home bc she is bed bound and 24hr care.
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
## 2020-04-18 NOTE — H&P (Addendum)
Date: 04/18/2020               Patient Name:  Gina [name] MRN: [id number]  DOB: 03-04-1929 Age / Sex: 86 y.o., female   PCP: [name], NP         Medical Service: Internal Medicine Teaching Service         Attending Physician: Dr. Velna Ochs, MD    First Contact: Dr. Fatima Sanger Pager: [phone]  Second Contact: Dr. Lonia Skinner  Pager: [phone]       After Hours (After 5p/  First Contact Pager: [phone]  weekends / holidays): Second Contact Pager: [phone]   Chief Complaint: left heel hurts  History of Present Illness: Patient was disoriented so history provided by niece. This heel injury was from a previous fall and hospitalization. She has been seen by home health since. Home health nurse last seen on Monday. Hurting for the last few days and recently started draining pus.  Patient reports that it has been hurting for a while. Limits her mobility because it hurts to walk on it.  ROS: Positive- Drainage Negative- fever, nausea, vomiting, diarrhea, weakness  Meds:  No outpatient medications have been marked as taking for the 04/18/20 encounter [facility] Encounter).     Allergies: Allergies as of 04/18/2020  . (No Known Allergies)   Past Medical History:  Diagnosis Date  . Acute diastolic congestive heart failure ([place]) 01/28/2013  . AKI (acute kidney injury) ([place]) 10/24/2016  . Anemia 11/30/2014  . Arthritis    lt hip  . Depression   . Diabetes mellitus, type 2 ([place])    Well controlled  . Graves disease    HX OF GRAVES  . Hyperlipidemia   . Hypertension   . Intraductal carcinoma 06/2003   Of right breast. s/p right partial mastectomy. // Followed by Dr. Marylene Buerger  . Mesenteric ischemia ([place])   . PVD (peripheral vascular disease) ([place])    S/P BL femoral-popliteal bypass surgery 03/2001 (right) and 01/2003 (left)  . TB lung, latent    Treated with INH in 11/2007  . Thyroid disease    Graves disease  . Transfusion history    last  admission 11-30-14  . Ulcer    gastric antral ulcer and AVMs    Family History: Patient is a poor historian but when asked these were the details given. mother- healthy but killed by her husband Father- died had health issues Brother- died 3 years ago diabetes Son- 64   Social History: former smoker, unsure of quit date but used to smoke 1/2 ppd. No EtOH, no illegal drug use Lives at home  Review of Systems: A complete ROS was negative except as per HPI.   Physical Exam: Blood pressure (!) 169/50, pulse (!) 53, temperature 98.4 F (36.9 C), temperature source Oral, resp. rate 14, SpO2 100 %. Physical Exam Vitals and nursing note reviewed.  Constitutional:      General: She is not in acute distress.    Appearance: She is not ill-appearing or toxic-appearing.  HENT:     Head: Normocephalic and atraumatic.  Eyes:     Extraocular Movements: Extraocular movements intact.  Cardiovascular:     Rate and Rhythm: Regular rhythm. Bradycardia present.     Pulses: Normal pulses.          Radial pulses are 2+ on the right side and 2+ on the left side.       Dorsalis pedis pulses are 2+ on  the right side and 2+ on the left side.     Heart sounds: Normal heart sounds, S1 normal and S2 normal. No murmur heard.   Pulmonary:     Effort: Pulmonary effort is normal. No respiratory distress.     Breath sounds: Normal breath sounds. No wheezing.  Abdominal:     General: There is no distension.     Palpations: Abdomen is soft. There is no mass.     Tenderness: There is no abdominal tenderness.  Musculoskeletal:     Right lower leg: No edema.     Left lower leg: No edema.  Feet:     Comments: Ulcer on Left heel. Neurological:     Mental Status: She is disoriented.      EKG: personally reviewed my interpretation is sinus bradycardia.  CXR: personally reviewed my interpretation is enlarging left-sided pleural effusion, now moderate.Small right-sided pleural effusion. Hazy bibasilar  opacities, may reflect atelectasis versus pneumonia  Foot X-ray: Deep soft tissue ulceration noted superficial to the posterior calcaneus. Indistinctness of the cortical margins subjacent to this ulceration could reflect early radiographic changes of osteomyelitis. No acute fracture or traumatic malalignment. Diffuse bony demineralization and mild degenerative changes throughout the foot   Assessment & Plan by Problem: Active Problems:   Osteomyelitis [facility])  Mrs. Rinkenberger is a 86 yr old female with PMHx of HTN, Anemia, Hypothyroidism, and GERD who present for a left foot ulcer.  Osteomyelitis: Painful ulcer on Left heel with purulent drainage. X-ray shows Indistinctness of the cortical margins subjacent to this ulceration could reflect early radiographic changes of osteomyelitis. Have called ortho for consult. Have started antibiotics. -[facility] recommendations -1 g Vancomycin started -Continue to monitor   Anemia: On iron supplement and B12 supplement. Continue home meds. If surgery is needed may need to give a unit. History of AVM. -Continue ferrous sulfate 325 mg daily -Continue B12 500 mcg daily -get CBC in the morning may transfuse    HTN: Start home dose of hydralazine. Due to HR will hold the Coreg for now, will hold the Lasix for now. -Start Hydralazine 100 mg q8   Hypothyroidism secondary to Ablation for Graves Disease: S/P thyroid ablation. Start home dose of Synthroid -Start Synthroid 150 mcg daily   Sleep Disorder: Start home dose of Remeron -Start Remeron 7.5 mg QHS   PVD: S/P bilateral femoral-popliteal bypass Right-03/2001 Left-01/2003 Consult Vascular -Will consult Vascular tomorrow morning   Diet: Heart healthy DVT: none IVF: 0.9 NaCl bolus 500 ml  Dispo: Admit patient to Inpatient with expected length of stay greater than 2 midnights.  Signed: Briant Cedar, MD 04/18/2020, 5:49 PM  Pager: [phone] After 5pm on weekdays and 1pm on  weekends: On Call pager: [phone]

## 2020-04-19 ENCOUNTER — Ambulatory Visit: Payer: Medicare Other | Admitting: Physician Assistant

## 2020-04-19 DIAGNOSIS — G9340 Encephalopathy, unspecified: Secondary | ICD-10-CM

## 2020-04-19 DIAGNOSIS — M86272 Subacute osteomyelitis, left ankle and foot: Secondary | ICD-10-CM

## 2020-04-19 DIAGNOSIS — E43 Unspecified severe protein-calorie malnutrition: Secondary | ICD-10-CM

## 2020-04-19 DIAGNOSIS — I96 Gangrene, not elsewhere classified: Secondary | ICD-10-CM

## 2020-04-19 DIAGNOSIS — T7401XA Adult neglect or abandonment, confirmed, initial encounter: Secondary | ICD-10-CM

## 2020-04-19 LAB — CBC
HCT: 30.6 % — ABNORMAL LOW (ref 36.0–46.0)
Hemoglobin: 9.7 g/dL — ABNORMAL LOW (ref 12.0–15.0)
MCH: 29.8 pg (ref 26.0–34.0)
MCHC: 31.7 g/dL (ref 30.0–36.0)
MCV: 94.2 fL (ref 80.0–100.0)
Platelets: 247 10*3/uL (ref 150–400)
RBC: 3.25 MIL/uL — ABNORMAL LOW (ref 3.87–5.11)
RDW: 16.5 % — ABNORMAL HIGH (ref 11.5–15.5)
WBC: 6.8 10*3/uL (ref 4.0–10.5)
nRBC: 0 % (ref 0.0–0.2)

## 2020-04-19 LAB — BASIC METABOLIC PANEL
Anion gap: 9 (ref 5–15)
BUN: 91 mg/dL — ABNORMAL HIGH (ref 8–23)
CO2: 29 mmol/L (ref 22–32)
Calcium: 8.5 mg/dL — ABNORMAL LOW (ref 8.9–10.3)
Chloride: 101 mmol/L (ref 98–111)
Creatinine, Ser: 2.24 mg/dL — ABNORMAL HIGH (ref 0.44–1.00)
GFR calc Af Amer: 22 mL/min — ABNORMAL LOW (ref >60)
GFR calc non Af Amer: 19 mL/min — ABNORMAL LOW (ref >60)
Glucose, Bld: 85 mg/dL (ref 70–99)
Potassium: 5.3 mmol/L — ABNORMAL HIGH (ref 3.5–5.1)
Sodium: 139 mmol/L (ref 135–145)

## 2020-04-19 LAB — TSH: TSH: 2.963 u[IU]/mL (ref 0.350–4.500)

## 2020-04-19 MED ORDER — OXYCODONE HCL 5 MG PO TABS
5.0000 mg | ORAL_TABLET | Freq: Four times a day (QID) | ORAL | Status: DC | PRN
  Administered 2020-04-19 – 2020-04-23 (×3): 5 mg via ORAL
  Filled 2020-04-19 (×3): qty 1

## 2020-04-19 MED ORDER — SODIUM CHLORIDE 0.9 % IV BOLUS
500.0000 mL | Freq: Once | INTRAVENOUS | Status: AC
  Administered 2020-04-19: 500 mL via INTRAVENOUS

## 2020-04-19 NOTE — Plan of Care (Signed)

## 2020-04-19 NOTE — Progress Notes (Addendum)
Subjective: Interviewed patient at bedside. Patient was very sleepy and had difficulty answering teams questions. Stated her foot was hurting her. Was able to tell us that her name is Gina [name] and that she was at [facility]. Reports that her foot has been hurting since Sunday. States she is sleepier today than normal. Difficulty with conducting interview because patient kept falling asleep. Kept eyes half closed for majority of interview. Team made her aware of planned procedure and possible amputation, and the patient did not like that idea. Patient did not have an answer for surgery vs.no surgery. Discussed the plan of talking to palliative care. Had no further questions for team.   Objective:  Vital signs in last 24 hours: Vitals:   04/18/20 2356 04/19/20 0035 04/19/20 0121 04/19/20 0330  BP:  (!) 164/42 (!) 165/34 (!) 158/42  Pulse:  (!) 44 (!) 45 (!) 50  Resp:    16  Temp:  (!) 97.3 F (36.3 C) (!) 97.3 F (36.3 C) (!) 97.4 F (36.3 C)  TempSrc:  Oral Oral Oral  SpO2: 100% 100% 100% 100%   Physical Exam Vitals and nursing note reviewed.  Constitutional:      General: She is not in acute distress.    Appearance: She is not ill-appearing or toxic-appearing.  HENT:     Head: Normocephalic and atraumatic.  Cardiovascular:     Rate and Rhythm: Regular rhythm. Bradycardia present.     Pulses: Normal pulses.          Radial pulses are 2+ on the right side and 2+ on the left side.       Dorsalis pedis pulses are 2+ on the right side and 2+ on the left side.     Heart sounds: Normal heart sounds, S1 normal and S2 normal. No murmur heard.   Pulmonary:     Effort: Pulmonary effort is normal. No respiratory distress.     Breath sounds: Normal breath sounds. No wheezing.  Abdominal:     General: There is no distension.     Palpations: Abdomen is soft. There is no mass.     Tenderness: There is no abdominal tenderness.  Musculoskeletal:     Right lower leg: No edema.     Left  lower leg: No edema.  Neurological:     Mental Status: She is lethargic.     Comments: Very somnolent had to be asked questions several times to get answers. Decision making capacity in question.      Assessment/Plan:  Active Problems:   Osteomyelitis [facility])   Gina [name] is a 86 yr old female with PMHx of HTN, Anemia, Hypothyroidism, PVD s/p bilateral femoral-popliteal bypass, and GERD who presents for a non-healing left foot ulcer.   GOC: Due to difficulty arousing patient and answering questions, patients ability to make decisions is in question. Unable to contact son. When asked if she wanted surgery on her heel she seemed to be against it. Will consult Palliative Care to help establish goals of care -Consult Palliative   Non-healing Ulcer w/ possible Osteomyelitis: Painful ulcer on Left heel with purulent drainage. X-ray shows Indistinctness of the cortical margins subjacent to this ulceration could reflect early radiographic changes of osteomyelitis. Have called ortho for consult. Have started antibiotics. -[facility] recommendations -1 g Vancomycin started -Continue to monitor   Anemia: On iron supplement and B12 supplement. Continue home meds. Hem was 7.0 Was given 1 unit transfusion to prepare her for possible surgery. -Continue ferrous sulfate  325 mg daily -Continue B12 500 mcg daily -trend CBC may consult GI if hem drops   HTN: Start home dose of hydralazine. Due to HR will hold the Coreg for now, will hold the Lasix for now. -Continue Hydralazine 100 mg q8   Hypothyroidism secondary to Ablation for Graves Disease: S/P thyroid ablation. Continue home dose of Synthroid -Continue Synthroid 150 mcg daily   Sleep Disorder: Continue home dose of Remeron -Continue Remeron 7.5 mg QHS   PVD: S/P bilateral femoral-popliteal bypass Right-03/2001 Left-01/2003 Consult Vascular -Will consult Vascular tomorrow morning   Diet: Heart healthy DVT: none IVF:  0.9 NaCl bolus 500 ml   Prior to Admission Living Arrangement: home Anticipated Discharge Location: SNF Barriers to Discharge: surgery vs goals of care Dispo: Anticipated discharge in approximately 2-4 day(s).    Briant Cedar, MD 04/19/2020, 6:47 AM Pager: [phone] After 5pm on weekdays and 1pm on weekends: On Call pager [phone]

## 2020-04-19 NOTE — Telephone Encounter (Signed)
Will route to PCP for FYI
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
## 2020-04-19 NOTE — Progress Notes (Signed)
Will complete admission profile once niece returns call.

## 2020-04-19 NOTE — Progress Notes (Addendum)
Contacted patients son. Attempted to discuss goals of care and history of patients current condition. He stated to contact the niece, that he did not know any of the history. Niece's contact info is in the chart will reach out to her later today.   Reached out to niece. Discussed current plan is to have an evaluation by Orthopedics.That when we talked to the patient she appeared hesitant to have possible surgery but it was hard to determine the patients uunderstanding. Niece stated that she would attempt to discuss the current situation with the patients son. She is willing to talk to Palliative Care. She confirmed that patient had discussed with her PCP that she would want to be DNR, I told her we would make that change in her chart to reflect the patients wishes.

## 2020-04-19 NOTE — Consult Note (Signed)
[facility] Nurse Consult Note: Reason for Consult: Patient with Unstageable pressure Injury to the left heel with intact, stable dry eschar. There is a callus to the posterior right heel from a previous pressure injury and a partial thickness (Stage 2) pressure injury to the right lateral malleolus.  Patient crosses her legs, has hairless lower extremities. See photo taken in ED of left posterior heel wound. Orthopedic PA B. Owens Shark saw in the ED last evening and Dr. Mardelle Matte referred to Dr. Eather Colas for definitive POC.  Wound type: Pressure, consider some degree of PAD Pressure Injury POA: Yes Measurement: Left heel:  4cm x 2.2cm with hard, dry eschar in center. Right posterior heel: 2cm round callus formation Right lateral malleolus partial thickness (Stage 2) pressure injury:  2cm x 1cm x 0.1cm with pink, dry wound bed Wound bed:As described above Drainage (amount, consistency, odor) None  Periwound: dry, intact and as noted above Dressing procedure/placement/frequency: I have implemented a conservative POC pending the evaluation by Dr. Sharol Given (Orthopedics) using a soap and water cleanse and application of betadine swab sticks to both dry and provide antimicrobial benefits. Bilateral pressure redistribution heel boots are provided to prevent patient from crossing her legs, provide padding and protection and to float the heels. A sacral silicone foam dressing is provided to prevent pressure injury to the sacrum.  [facility] nursing team will not follow, but will remain available to this patient, the nursing and medical teams.  Please re-consult if needed. Thanks, Maudie Flakes, MSN, RN, [place], Ar[facility]  Pager# [phone]

## 2020-04-20 LAB — TYPE AND SCREEN
ABO/RH(D): O POS
Antibody Screen: NEGATIVE
Unit division: 0

## 2020-04-20 LAB — BPAM RBC
Blood Product Expiration Date: 202108202359
ISSUE DATE / TIME: 202107210049
Unit Type and Rh: 5100

## 2020-04-20 MED ORDER — ADULT MULTIVITAMIN W/MINERALS CH
1.0000 | ORAL_TABLET | Freq: Every day | ORAL | Status: DC
  Administered 2020-04-21 – 2020-04-22 (×2): 1 via ORAL
  Filled 2020-04-20 (×2): qty 1

## 2020-04-20 MED ORDER — ALBUTEROL SULFATE (2.5 MG/3ML) 0.083% IN NEBU: 2.5000 mg | INHALATION_SOLUTION | Freq: Four times a day (QID) | RESPIRATORY_TRACT | Status: DC | PRN

## 2020-04-20 NOTE — Progress Notes (Signed)
Patient ID: JUANETTE URIZAR, female   DOB: [birth year], 86 y.o.   MRN: [id number]   Attempted to set up meeting with family for goals of care meeting.    Spoke to son [name],  He quickly directed me to patient's niece Annette Hickman.  Son told me that she was the Media planner.  Left message for [name] and she did call me back and was adamant that she will not make decisions for this patient, "it is her son's responsibility."  Palliative provider will contact son tomorrow and attempt to set up a meeting.  Wadie Lessen NP  Palliative Medicine Team Team Phone # [phone] Pager [phone]  No charge
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
## 2020-04-20 NOTE — Progress Notes (Signed)
Initial Nutrition Assessment  DOCUMENTATION CODES:   Underweight  INTERVENTION:   -MVI with minerals daily -Magic cup TID with meals, each supplement provides 290 kcal and 9 grams of protein  NUTRITION DIAGNOSIS:   Increased nutrient needs related to wound healing as evidenced by estimated needs.  GOAL:   Patient will meet greater than or equal to 90% of their needs  MONITOR:   PO intake, Supplement acceptance, Labs, Weight trends, Skin, I & O's  REASON FOR ASSESSMENT:   Malnutrition Screening Tool    ASSESSMENT:   Gina [name] is a 86 yr old female with PMHx of HTN, Anemia, Hypothyroidism, and GERD who present for a left foot ulcer.  Pt admitted with lt heel wound with osteomyelitis.   Reviewed I/O's: -1 ml x 24 hours and +1 L since admission  Per orthopedics notes, awaiting family decision on management. There is concern that pt would not heal well from amputation and may need to consider comfort care.  Per chart review, pt has been removing foot dressing and removing covers when being examined.   RD attempted to speak with pt, however, receiving nursing care at time of visit. Pt very lethargic and requires a lot of encouragement to take medications. Pt di dnot consume any breakfast this morning.   Reviewed wt hx; pt has experienced a 19.4% wt loss over the past 3 months. Highly suspect pt with malnutrition. Pt has been identified with malnutrition during previously admissions and suspect this is ongoing.   Palliative care has been consulted for goals of care discussions.   Medications reviewed remeron and vitamin B-12.   Labs reviewed: K: 5.3.   Diet Order:   Diet Order            Diet regular Room service appropriate? Yes; Fluid consistency: Thin  Diet effective now                 EDUCATION NEEDS:   No education needs have been identified at this time  Skin:  Skin Assessment: Skin Integrity Issues: Skin Integrity Issues:: Other (Comment) Other:  non-pressure wound to lt heel  Last BM:  04/18/20  Height:   Ht Readings from Last 1 Encounters:  03/15/20 5\' 6"  (1.676 m)    Weight:   Wt Readings from Last 1 Encounters:  04/20/20 42.4 kg    Ideal Body Weight:  59.1 kg  BMI:  Body mass index is 15.09 kg/m.  Estimated Nutritional Needs:   Kcal:  1500-1700  Protein:  70-85 grams  Fluid:  > 1.5 L    Loistine Chance, RD, LDN, [place] Registered Dietitian II Certified Diabetes Care and Education Specialist Please refer to [facility] for RD and/or RD on-call/weekend/after hours pager

## 2020-04-20 NOTE — Progress Notes (Signed)
Per provider, give PO hydralazine. 2nd attempt made and pt agreed to take medication.

## 2020-04-20 NOTE — Telephone Encounter (Signed)
Copied from [place] [id number]. Topic: General - Inquiry >> Apr 20, 2020  3:21 PM Scherrie Gerlach wrote: Isidor Holts with [employer] calling to follow up on certificate of necessity she faxed 7/09 and has had no response.  She is going to fax again, please send back asap. Any questions, cb  [phone] Fax: [phone]

## 2020-04-20 NOTE — Progress Notes (Signed)
Patient will not keep dressing on her foot.

## 2020-04-20 NOTE — Plan of Care (Signed)
  Problem: Safety: Goal: Ability to remain free from injury will improve Outcome: Progressing   

## 2020-04-20 NOTE — Progress Notes (Signed)
Pt refused afternoon medication as well as labs.

## 2020-04-21 DIAGNOSIS — Z7189 Other specified counseling: Secondary | ICD-10-CM

## 2020-04-21 DIAGNOSIS — Z515 Encounter for palliative care: Secondary | ICD-10-CM

## 2020-04-21 DIAGNOSIS — Z66 Do not resuscitate: Secondary | ICD-10-CM

## 2020-04-21 DIAGNOSIS — F322 Major depressive disorder, single episode, severe without psychotic features: Secondary | ICD-10-CM

## 2020-04-21 MED ORDER — CITALOPRAM HYDROBROMIDE 20 MG PO TABS
10.0000 mg | ORAL_TABLET | Freq: Every day | ORAL | Status: DC
  Administered 2020-04-21 – 2020-04-22 (×2): 10 mg via ORAL
  Filled 2020-04-21 (×2): qty 1

## 2020-04-21 MED ORDER — ENSURE ENLIVE PO LIQD
237.0000 mL | Freq: Three times a day (TID) | ORAL | Status: DC
  Administered 2020-04-21 – 2020-04-23 (×6): 237 mL via ORAL

## 2020-04-21 NOTE — TOC Progression Note (Addendum)
Transition of Care [facility]) - Progression Note    Patient Details  Name: Gina [name] MRN: [id number] Date of Birth: [birth year]  Transition of Care [facility]) CM/SW [place], [place] Phone Number: 04/21/2020, 4:23 PM  Clinical Narrative:     CSW received a phone call from physician to make a  report to APS concerning pt's care at home as noted  03/15/20 due to pt's orientation fluctuating. CSW made report based on previous note on 04/19/20.       Expected Discharge Plan and Services                                                 Social Determinants of Health (SDOH) Interventions    Readmission Risk Interventions Readmission Risk Prevention Plan 01/27/2020 04/20/2019 04/15/2019  Transportation Screening Complete Complete Complete  PCP or Specialist Appt within 3-5 Days - Complete -  HRI or [place] - Complete -  Social Work Consult for [place] Planning/Counseling - Complete -  Palliative Care Screening - Not Applicable -  Medication Review Press photographer) Complete Complete -  PCP or Specialist appointment within 3-5 days of discharge Complete - -  [place] or Home Care Consult Complete - Complete  SW Recovery Care/Counseling Consult Complete - -  Palliative Care Screening Not Applicable - Not Applicable  Skilled Nursing Facility Complete - Not Applicable  Some recent data might be hidden
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
## 2020-04-21 NOTE — Progress Notes (Signed)
Contacted at 1320 on 04/21/20 by CCMD to notify of 8 beat run of VT.  Patient cognition at baseline.  No signs of distress.  Reported VT and VS's to resident who consulted with attending.  MD states they will review tele strip and labs.  No further orders received.

## 2020-04-21 NOTE — Consult Note (Addendum)
Palliative Medicine Inpatient Consult Note  Reason for consult:  Goals of Care "[place] discussion, possibility of surgery, question decision making"  HPI:  Per intake H&P --> Gina [name] is a 86 y.o. female who presents with severe peripheral vascular disease type 2 diabetes who is status post endovascular intervention for limb salvage. Patient presents at this time with gangrenous changes to the left heel with severe protein caloric malnutrition and by report patient has not been eating.  Palliative Care was asked to consult to aid in goals of care conversations.  Clinical Assessment/Goals of Care: I have reviewed medical records including EPIC notes, labs and imaging, received report from bedside RN, assessed the patient.    I called Tommy Penn to further discuss diagnosis prognosis, GOC, EOL wishes, disposition and options.   I introduced Palliative Medicine as specialized medical care for people living with serious illness. It focuses on providing relief from the symptoms and stress of a serious illness. The goal is to improve quality of life for both the patient and the family.  I asked Tommy to tell me about his mother. He shares that she is from the [place] area. She was married but his father passed away years ago. 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 She is a woman of faith and practices within the [facility] denomination.  I asked Tommy what [name]'s functional state had been. He said that she was at [facility] for sometime though he was told that they would have to pay out of pocket for this care which he could not afford. He shares that he provided total care for her from June 6th on. He notes that his niece, [name] was not helpful to him. He felt that his mother was "dropped off" and he was held responsible to care for her. He vocalizes that it is "no fin for a man to have to clean his mother." I utilized reflective listening during our time together.   A detailed discussion  was had today regarding advanced directives. There has been an ongoing dilemma with patients son, Konrad Dolores and patients niece, [name]. I shared with each of them that it is of the utmost importance to identify who will make decisions for Gina [name] given her declining health state. Patients son, Konrad Dolores vocalized that he would step up if he could make these decisions legally. I shared with him that we have no paperwork to state otherwise nor does [name] have paperwork to provide [place] to say otherwise. I shared that in the state of [place] it is him, the next of kin who we rely on to help make these decisions. He vocalized understanding and does consent to meeting in person tomorrow at [place]. I asked [name] if she would like to be present though she declined.  Concepts specific to code status, artifical feeding and hydration, continued IV antibiotics and rehospitalization was had.  Patient is enlisted at a DNR per a prior conversation with her PCP. We plan to complete a MOST form once her son visits declaring this more formally.   The difference between a aggressive medical intervention path  and a palliative comfort care path for this patient at this time was had. I shared with Konrad Dolores that it would be prudent is surgery is not decided for her heel that he consider hospice care. We discussed her poor nutritional state and albumin of 2.2. We discussed her immobility. I described hospice as a service for patients for have a life expectancy of <  65months. It preserves dignity and quality at the end phases of life. The focus changes from curative to symptom relief.   Konrad Dolores confirms that his mother has been declining for sometime. We discussed quality over quantity of life which seemed to resonate with him. We plan to continue this conversation in the oncoming days.   Discussed the importance of continued conversation with family and their  medical providers regarding overall plan of care and treatment options,  ensuring decisions are within the context of the patients values and GOCs.  Decision Maker: [name] (son) [phone]  SUMMARY OF RECOMMENDATIONS   DNAR/DNI  Ongoing [place] conversations  Plan to meet with Konrad Dolores (son) tomorrow at [place]: DNAR/DNI   Palliative Prophylaxis:   Oral Care, Turn Q2H, Constipation  Additional Recommendations (Limitations, Scope, Preferences):  Continue with current scope of care   Psycho-social/Spiritual:   Desire for further Chaplaincy support: Yes - [employer]  Additional Recommendations: Education on frailty and hospice care   Prognosis: Poor given patients severe protein-calorie malnutrition - It is very reasonable to consider hospice care at this juncture. I worry patient would not heel well if surgery were pursued.   Discharge Planning: Discharge plan is not clear at this time.   PPS: 10-20%   This conversation/these recommendations were discussed with patients primary care team.  Time In: 1530 Time Out: 1645 Total Time: 75 Greater than 50%  of this time was spent counseling and coordinating care related to the above assessment and plan.  [place] Team Team Cell Phone: [phone] Please utilize secure chat with additional questions, if there is no response within 30 minutes please call the above phone number  Palliative Medicine Team providers are available by phone from 7am to 7pm daily and can be reached through the team cell phone.  Should this patient require assistance outside of these hours, please call the patient's attending physician.

## 2020-04-21 NOTE — Progress Notes (Signed)
Nutrition Follow-up  DOCUMENTATION CODES:   Underweight, Severe malnutrition in context of chronic illness  INTERVENTION:   -Ensure Enlive po TID, each supplement provides 350 kcal and 20 grams of protein -Continue Magic cup TID with meals, each supplement provides 290 kcal and 9 grams of protein -Continue MVI with minerals daily -Feeding assistance with meals  NUTRITION DIAGNOSIS:   Severe Malnutrition related to chronic illness (PVD) as evidenced by percent weight loss, severe fat depletion, severe muscle depletion.  Ongoing  GOAL:   Patient will meet greater than or equal to 90% of their needs  Progressing   MONITOR:   PO intake, Supplement acceptance, Labs, Weight trends, Skin, I & O's  REASON FOR ASSESSMENT:   Malnutrition Screening Tool    ASSESSMENT:   Gina [name] is a 86 yr old female with PMHx of HTN, Anemia, Hypothyroidism, and GERD who present for a left foot ulcer.  Reviewed I/O's: +40 ml x 24 hours and +1.1 L since admission  UOP: 300 ml x 24 hours  Pt resting quietly at time of visit, complaining of being cold. RD assisted with covering pt up with additional blankets.   Observed breakfast meal tray- pt consumed only a few bites of grits.   Spoke with pt son at bedside, who reports that pt needs constant assistance and encouragement to eat. She typically eats very little- mostly bites and sips. Pt son says she will consistently consume 2 Ensure supplements daily, but needs help holding them. Pt son requesting assistance with meals if he or another family member is not present.   Family meeting with palliative care scheduled for today.   Labs reviewed: K: 5.3.   NUTRITION - FOCUSED PHYSICAL EXAM:    Most Recent Value  Orbital Region Severe depletion  Upper Arm Region Severe depletion  Thoracic and Lumbar Region Severe depletion  Buccal Region Severe depletion  Temple Region Severe depletion  Clavicle Bone Region Severe depletion  Clavicle and  Acromion Bone Region Severe depletion  Scapular Bone Region Severe depletion  Dorsal Hand Severe depletion  Patellar Region Severe depletion  Anterior Thigh Region Severe depletion  Posterior Calf Region Severe depletion  Edema (RD Assessment) None  Hair Reviewed  Eyes Reviewed  Mouth Reviewed  Skin Reviewed  Nails Reviewed       Diet Order:   Diet Order            Diet regular Room service appropriate? Yes; Fluid consistency: Thin  Diet effective now                 EDUCATION NEEDS:   Education needs have been addressed  Skin:  Skin Assessment: Skin Integrity Issues: Skin Integrity Issues:: Other (Comment) Other: non-pressure wound to lt heel  Last BM:  04/18/20  Height:   Ht Readings from Last 1 Encounters:  03/15/20 5\' 6"  (1.676 m)    Weight:   Wt Readings from Last 1 Encounters:  04/20/20 42.4 kg    Ideal Body Weight:  59.1 kg  BMI:  Body mass index is 15.09 kg/m.  Estimated Nutritional Needs:   Kcal:  1500-1700  Protein:  70-85 grams  Fluid:  > 1.5 L    Loistine Chance, RD, LDN, [place] Registered Dietitian II Certified Diabetes Care and Education Specialist Please refer to [facility] for RD and/or RD on-call/weekend/after hours pager

## 2020-04-21 NOTE — Plan of Care (Signed)
  Problem: Education: Goal: Knowledge of General Education information will improve Description: Including pain rating scale, medication(s)/side effects and non-pharmacologic comfort measures 04/21/2020 1822 by Reed Breech, RN Outcome: Progressing 04/21/2020 1821 by Reed Breech, RN Outcome: Progressing   Problem: Health Behavior/Discharge Planning: Goal: Ability to manage health-related needs will improve 04/21/2020 1822 by Reed Breech, RN Outcome: Progressing 04/21/2020 1821 by Reed Breech, RN Outcome: Progressing

## 2020-04-22 ENCOUNTER — Encounter (HOSPITAL_COMMUNITY): Payer: Self-pay | Admitting: Internal Medicine

## 2020-04-22 LAB — BASIC METABOLIC PANEL
Anion gap: 13 (ref 5–15)
BUN: 80 mg/dL — ABNORMAL HIGH (ref 8–23)
CO2: 25 mmol/L (ref 22–32)
Calcium: 8.6 mg/dL — ABNORMAL LOW (ref 8.9–10.3)
Chloride: 104 mmol/L (ref 98–111)
Creatinine, Ser: 1.89 mg/dL — ABNORMAL HIGH (ref 0.44–1.00)
GFR calc Af Amer: 27 mL/min — ABNORMAL LOW (ref >60)
GFR calc non Af Amer: 23 mL/min — ABNORMAL LOW (ref >60)
Glucose, Bld: 80 mg/dL (ref 70–99)
Potassium: 3.9 mmol/L (ref 3.5–5.1)
Sodium: 142 mmol/L (ref 135–145)

## 2020-04-22 LAB — MAGNESIUM: Magnesium: 1.8 mg/dL (ref 1.7–2.4)

## 2020-04-22 MED ORDER — FENTANYL CITRATE (PF) 100 MCG/2ML IJ SOLN: 12.5000 ug | INTRAMUSCULAR | Status: DC | PRN

## 2020-04-22 MED ORDER — POLYETHYLENE GLYCOL 3350 17 G PO PACK
17.0000 g | PACK | Freq: Every day | ORAL | Status: DC
  Administered 2020-04-22 – 2020-04-23 (×2): 17 g via ORAL
  Filled 2020-04-22 (×2): qty 1

## 2020-04-22 MED ORDER — BISACODYL 10 MG RE SUPP
10.0000 mg | Freq: Once | RECTAL | Status: AC
  Administered 2020-04-22: 10 mg via RECTAL
  Filled 2020-04-22: qty 1

## 2020-04-22 MED ORDER — LORAZEPAM 2 MG/ML IJ SOLN
0.5000 mg | INTRAMUSCULAR | Status: DC | PRN
  Administered 2020-04-23: 0.5 mg via INTRAVENOUS
  Filled 2020-04-22: qty 1

## 2020-04-22 MED ORDER — ONDANSETRON HCL 4 MG/2ML IJ SOLN
4.0000 mg | Freq: Four times a day (QID) | INTRAMUSCULAR | Status: DC | PRN
  Filled 2020-04-22: qty 2

## 2020-04-22 MED ORDER — MAGNESIUM SULFATE 2 GM/50ML IV SOLN
2.0000 g | Freq: Once | INTRAVENOUS | Status: AC
  Administered 2020-04-22: 2 g via INTRAVENOUS
  Filled 2020-04-22: qty 50

## 2020-04-22 MED ORDER — BIOTENE DRY MOUTH MT LIQD: 15.0000 mL | OROMUCOSAL | Status: DC | PRN

## 2020-04-22 NOTE — Progress Notes (Signed)
[facility] ([facility]) Hospital Liaison note.     Received request from TOC manager for family interest in [facility]. [facility] is unable to offer a room today. Hospital Liaison will follow up tomorrow or sooner if a room becomes available.     A Please do not hesitate to call with questions.     Thank you,    Mary Anne Robertson, RN, CCM       [facility] Hospital Liaison (listed on [facility] under [facility]osp[facility])     [phone]  

## 2020-04-22 NOTE — Progress Notes (Addendum)
Palliative Medicine Inpatient Follow Up Note   Reason for consult:  Goals of Care "[place] discussion, possibility of surgery, question decision making"  HPI:  Per intake H&P --> AVALEE CASTRELLON a 86 y.o.femalewho presents with severe peripheral vascular disease type 2 diabetes who is status post endovascular intervention for limb salvage. Patient presents at this time with gangrenous changes to the left heel with severe protein caloric malnutrition and by report patient has not been eating.  Palliative Care was asked to consult to aid in goals of care conversations.  Today's Discussion (04/22/2020): Chart reviewed. Patient appears to be eating and drinking very little.   I met with patients son, Konrad Dolores, and niece, [name] at [employer]. I introduced Palliative Medicine as specialized medical care for people living with serious illness. It focuses on providing relief from the symptoms and stress of a serious illness. The goal is to improve quality of life for both the patient and the family.  I asked Tommy what he understood about Regana's present ailments. He stated that she has a wound on her foot which will not heel. We talked about osteomyelitis and its presentation. We discussed that even with surgical intervention her risks are extremely high and she has an immensely unfavorable chance for recovery with her severe PVD and malnourished state.   We discussed that she it likely a poor SNF candidate fiven her degree of debility and her lack of ability to participate with PT/OT.   We talked more about what life means to [facility]. Expressed that we have two options, to continue with aggressive management or consider transition to a more comfort oriented approach and allow nature to take its course. I shared that despite our best efforts, I worry that Guadalupe likely will not improve and may worsen. I shared that it is very reasonable to consider hospice oriented care. Patients son, Konrad Dolores shared  that he would not be willing to take her home to care for her. We discussed residential hospice as an alternative choice at this point in time.  Konrad Dolores was very tearful during our time together. He stated that he feels keeping Reena comfortable at residential hospice --[place] would be the best option for her.   We talked about transition to comfort measures in house and what that would entail inclusive of medications to control pain, dyspnea, agitation, nausea, itching, and hiccups.  We discussed stopping all uneccessary measures such as blood draws, needle sticks, and frequent vital signs.   Offered emotional support through therapeutic listening.   Discussed the importance of continued conversation with family and their  medical providers regarding overall plan of care and treatment options, ensuring decisions are within the context of the patients values and GOCs.  Questions and concerns addressed   SUMMARY OF RECOMMENDATIONS DNAR/DNI --> Transition focus to comfort  TOC --> [facility]   Will minimize polypharmacy, lab draws, invasive procedures.  Will add some PRN comfort medications.   Time Spent: 60 minutes Greater than 50% of the time was spent in counseling and coordination of care ______________________________________________________________________________________ [place] Team Team Cell Phone: [phone] Please utilize secure chat with additional questions, if there is no response within 30 minutes please call the above phone number  Palliative Medicine Team providers are available by phone from 7am to 7pm daily and can be reached through the team cell phone.  Should this patient require assistance outside of these hours, please call the patient's attending physician.

## 2020-04-22 NOTE — Plan of Care (Signed)
  Problem: Activity: Goal: Risk for activity intolerance will decrease Outcome: Progressing   Problem: Coping: Goal: Level of anxiety will decrease Outcome: Progressing   Problem: Pain Managment: Goal: General experience of comfort will improve Outcome: Progressing   Problem: Safety: Goal: Ability to remain free from injury will improve Outcome: Progressing   Problem: Skin Integrity: Goal: Risk for impaired skin integrity will decrease Outcome: Progressing   

## 2020-04-22 NOTE — Progress Notes (Signed)
Manufacturing engineer [facility] liaison note  Met with pt at bedside.  Pt alert, conversant, only c/o constipation.  Bedside RN Angelito made aware, reaching out to MD for orders.  No family present at bedside.  Spoke with son Konrad Dolores by phone who verbalized understanding of his mother's situation.  Plan made to reassess bed availability tomorrow and to keep family updated.  Thank you for the opportunity to participate in this patient's care.  Domenic Moras, BSN, RN [facility] [phone] (24h on call)

## 2020-04-22 NOTE — Plan of Care (Signed)
  Problem: Education: Goal: Knowledge of General Education information will improve Description: Including pain rating scale, medication(s)/side effects and non-pharmacologic comfort measures Outcome: Progressing   Problem: Health Behavior/Discharge Planning: Goal: Ability to manage health-related needs will improve Outcome: Progressing   Problem: Clinical Measurements: Goal: Ability to maintain clinical measurements within normal limits will improve Outcome: Progressing   Problem: Activity: Goal: Risk for activity intolerance will decrease Outcome: Progressing   Problem: Nutrition: Goal: Adequate nutrition will be maintained Outcome: Progressing   Problem: Elimination: Goal: Will not experience complications related to bowel motility Outcome: Progressing   Problem: Safety: Goal: Ability to remain free from injury will improve Outcome: Progressing   Problem: Skin Integrity: Goal: Risk for impaired skin integrity will decrease Outcome: Progressing

## 2020-04-22 NOTE — Plan of Care (Signed)
  Problem: Education: Goal: Knowledge of General Education information will improve Description: Including pain rating scale, medication(s)/side effects and non-pharmacologic comfort measures Outcome: Progressing   Problem: Health Behavior/Discharge Planning: Goal: Ability to manage health-related needs will improve Outcome: Progressing   Problem: Activity: Goal: Risk for activity intolerance will decrease Outcome: Progressing   Problem: Coping: Goal: Level of anxiety will decrease Outcome: Progressing   Problem: Elimination: Goal: Will not experience complications related to bowel motility Outcome: Progressing   Problem: Safety: Goal: Ability to remain free from injury will improve Outcome: Progressing   Problem: Skin Integrity: Goal: Risk for impaired skin integrity will decrease Outcome: Progressing   

## 2020-04-22 NOTE — TOC Progression Note (Signed)
Transition of Care [facility]) - Progression Note    Patient Details  Name: Gina [name] MRN: [id number] Date of Birth: 03/06/1929  Transition of Care [facility]) CM/SW Contact  Elliot Gurney [place], [place] Phone Number: [phone] 04/22/2020, 11:55 AM  Clinical Narrative:    Phone call to [facility] for [name] she is aware of referral and son's request for [facility]. She will be up to assess patient within the hour.   [address], LCSW Transition of Care [phone]         Expected Discharge Plan and Services                                                 Social Determinants of Health (SDOH) Interventions    Readmission Risk Interventions Readmission Risk Prevention Plan 01/27/2020 04/20/2019 04/15/2019  Transportation Screening Complete Complete Complete  PCP or Specialist Appt within 3-5 Days - Complete -  HRI or [place] - Complete -  Social Work Consult for [place] Planning/Counseling - Complete -  Palliative Care Screening - Not Applicable -  Medication Review Press photographer) Complete Complete -  PCP or Specialist appointment within 3-5 days of discharge Complete - -  [place] or Home Care Consult Complete - Complete  SW Recovery Care/Counseling Consult Complete - -  Palliative Care Screening Not Applicable - Not Applicable  Skilled Nursing Facility Complete - Not Applicable  Some recent data might be hidden
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
## 2020-04-23 MED ORDER — OXYCODONE HCL 5 MG PO TABS: 5.0000 mg | ORAL_TABLET | Freq: Four times a day (QID) | ORAL | 0 refills | Status: DC | PRN

## 2020-04-23 MED ORDER — POLYETHYLENE GLYCOL 3350 17 G PO PACK: 17.0000 g | PACK | Freq: Every day | ORAL | 0 refills | Status: AC

## 2020-04-23 MED ORDER — OXYCODONE HCL 5 MG PO TABS: 5.0000 mg | ORAL_TABLET | Freq: Four times a day (QID) | ORAL | 0 refills | Status: AC | PRN

## 2020-04-23 NOTE — Progress Notes (Signed)
   Palliative Medicine Inpatient Follow Up Note  Reason for consult:Goals of Care "[place] discussion, possibility of surgery,question decisionmaking"  HPI: Per intake H&P -->Gina J Garneris a 86 y.o.femalewho presents with severe peripheral vascular disease type 2 diabetes who is status post endovascular intervention for limb salvage. Patient presents at this time with gangrenous changes to the left heel with severe protein caloric malnutrition and by report patient has not been eating.  Palliative Care was asked to consult to aid in goals of care conversations.  Today's Discussion (04/23/2020): Chart reviewed. Upon assessment this morning Gina [name] was moaning in discomfort, I requested that the bedside RN provide some PRN pain medication.   Patient will transition to [facility] this afternoon.   Family has been updated on plan of care.   Discussed the importance of continued conversation with family and their  medical providers regarding overall plan of care and treatment options, ensuring decisions are within the context of the patients values and GOCs.  Questions and concerns addressed   SUMMARY OF RECOMMENDATIONS   DNAR/DNI   Comfort Measures  Transfer to [facility] this afternoon  Time Spent: 25 Greater than 50% of the time was spent in counseling and coordination of care ______________________________________________________________________________________ [place] Team Team Cell Phone: [phone] Please utilize secure chat with additional questions, if there is no response within 30 minutes please call the above phone number  Palliative Medicine Team providers are available by phone from 7am to 7pm daily and can be reached through the team cell phone.  Should this patient require assistance outside of these hours, please call the patient's attending physician.
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
## 2020-04-23 NOTE — TOC Progression Note (Signed)
Transition of Care [facility]) - Progression Note    Patient Details  Name: BRANDELYN HENNE MRN: [id number] Date of Birth: 1928/10/27  Transition of [facility]) CM/SW Contact  Elliot Gurney [employer], [place] Phone Number: [phone] 04/23/2020, 1:46 PM  Clinical Narrative:    Patient has been approved for [facility] transfer. Transportation arranged using non-emergent ambulance service (PTAR). Report to be called to [place].   [place], LCSW Transition of Care [phone]         Expected Discharge Plan and Services           Expected Discharge Date: 04/23/20                                     Social Determinants of Health (SDOH) Interventions    Readmission Risk Interventions Readmission Risk Prevention Plan 01/27/2020 04/20/2019 04/15/2019  Transportation Screening Complete Complete Complete  PCP or Specialist Appt within 3-5 Days - Complete -  HRI or [place] - Complete -  Social Work Consult for [place] Planning/Counseling - Complete -  Palliative Care Screening - Not Applicable -  Medication Review Press photographer) Complete Complete -  PCP or Specialist appointment within 3-5 days of discharge Complete - -  [place] or Home Care Consult Complete - Complete  SW Recovery Care/Counseling Consult Complete - -  Palliative Care Screening Not Applicable - Not Applicable  Skilled Nursing Facility Complete - Not Applicable  Some recent data might be hidden

## 2020-04-23 NOTE — Discharge Summary (Signed)
Name: Gina [name] MRN: [id number] DOB: 06/25/1929 86 y.o. PCP: [name], NP  Date of Admission: 04/18/2020  1:50 PM Date of Discharge: 04/23/2020 Attending Physician: Velna Ochs, MD  Discharge Diagnosis: 1. Osteomyelitisof Left Heel Acute on ChronicAnemia HTN  Discharge Medications: Allergies as of 04/23/2020   No Known Allergies     Medication List    STOP taking these medications   atorvastatin 20 MG tablet Commonly known as: LIPITOR   calcium carbonate 1500 (600 Ca) MG Tabs tablet Commonly known as: OSCAL   doxycycline 100 MG tablet Commonly known as: ADOXA   feeding supplement (PRO-STAT SUGAR FREE 64) Liqd   ferrous sulfate 325 (65 FE) MG EC tablet   furosemide 20 MG tablet Commonly known as: LASIX   furosemide 80 MG tablet Commonly known as: LASIX   latanoprost 0.005 % ophthalmic solution Commonly known as: XALATAN   pantoprazole 40 MG tablet Commonly known as: PROTONIX   potassium chloride SA 20 MEQ tablet Commonly known as: KLOR-CON   Rhopressa 0.02 % Soln Generic drug: Netarsudil Dimesylate   simvastatin 10 MG tablet Commonly known as: ZOCOR   vitamin B-12 500 MCG tablet Commonly known as: CYANOCOBALAMIN   vitamin E 180 MG (400 UNITS) capsule     TAKE these medications   albuterol 108 (90 Base) MCG/ACT inhaler Commonly known as: VENTOLIN HFA Inhale 2 puffs into the lungs every 6 (six) hours as needed for wheezing or shortness of breath.   carvedilol 3.125 MG tablet Commonly known as: Coreg Take 0.5 tablets (1.5625 mg total) by mouth 2 (two) times daily with a meal. Take 0.5 tab = 1.5625 mg by mouth twice a day What changed: additional instructions   feeding supplement (ENSURE ENLIVE) Liqd Take 237 mLs by mouth 2 (two) times daily between meals.   hydrALAZINE 100 MG tablet Commonly known as: APRESOLINE Take 1 tablet (100 mg total) by mouth every 8 (eight) hours.   levothyroxine 150 MCG tablet Commonly known  as: SYNTHROID Take 1 tablet (150 mcg total) by mouth daily.   linaclotide 145 MCG Caps capsule Commonly known as: Linzess Take 1 capsule (145 mcg total) by mouth daily before breakfast.   mirtazapine 15 MG tablet Commonly known as: REMERON Take 0.5 tablets (7.5 mg total) by mouth at bedtime.   oxyCODONE 5 MG immediate release tablet Commonly known as: Oxy IR/ROXICODONE Take 1 tablet (5 mg total) by mouth every 6 (six) hours as needed for moderate pain.   polyethylene glycol 17 g packet Commonly known as: MIRALAX / GLYCOLAX Take 17 g by mouth daily. Start taking on: April 24, 2020            Discharge Care Instructions  (From admission, onward)         Start     Ordered   04/23/20 0000  Discharge wound care:       Comments: Wound care to left heel eschar, right posterior heel callus and right lateral ankle Stage 2 pressure injury (POA):  Cleanse feet and bilateral LEs with soap and water, rinse and pat dry. Paint wounds with betadine swab sticks and allow to air dry. Cover with dry gauze dressings and secure with a few turns of Kerlix roll gauze/paper tape.  Place feet ito Prevalon boots.   04/23/20 1310   04/23/20 0000  Discharge wound care:       Comments: Wound care to left heel eschar, right posterior heel callus and right lateral ankle Stage 2 pressure injury (  POA):  Cleanse feet and bilateral LEs with soap and water, rinse and pat dry. Paint wounds with betadine swab sticks and allow to air dry. Cover with dry gauze dressings and secure with a few turns of Kerlix roll gauze/paper tape.  Place feet ito Prevalon boots.   04/23/20 1324          Disposition and follow-up:   Gina [name] was discharged from [facility] in [place] condition.  At the hospital follow up visit please address:  1.  Osteomyelitisof Left Heel:Painful ulcer on Left heel with purulent drainage.Pressure ulcer formed after hospitalization in May, 2021. Has had HH and wound  care for it since then. Became severely painful in the last few days with some drainage and pus formation.X-ray shows Indistinctness of the cortical margins subjacent to this ulceration could reflect early radiographic changes of osteomyelitis.Ortho consulted and felt she was a poor surgical candidate. After son discussed goals of care with Palliative she has been made comfort care   Acute on ChronicAnemia:On iron supplement and B12 supplement. Hem was 7.0,given 1 unit transfusion, improved to9.7. Has history of AVMs, recent admission for UGI bleed. Was continued on her home doses of ferrous sulfate and B12.After son discussed goals of care with Palliative she was made comfort care and these medications and blood draws for monitoring were stopped.  RPR:XYVOPF dose of hydralazine. Due to HR will hold the Coreg for now, will hold the Lasix for now. ContinuedHydralazine 100 mg q8. Pressures partially controlled.  2.  Labs / imaging needed at time of follow-up: none  3.  Pending labs/ test needing follow-up: none  Follow-up Appointments: none  Hospital Course by problem list: 1. Osteomyelitisof Left Heel:Painful ulcer on Left heel with purulent drainage.Pressure ulcer formed after hospitalization in May, 2021. Has had HH and wound care for it since then. Became severely painful in the last few days with some drainage and pus formation.X-ray shows Indistinctness of the cortical margins subjacent to this ulceration could reflect early radiographic changes of osteomyelitis.Ortho consulted and felt she was a poor surgical candidate. After son discussed goals of care with Palliative she has been made comfort care   Acute on ChronicAnemia:On iron supplement and B12 supplement. Hem was 7.0,given 1 unit transfusion, improved to9.7. Has history of AVMs, recent admission for UGI bleed. Was continued on her home doses of ferrous sulfate and B12.After son discussed goals of care with Palliative  she was made comfort care and these medications and blood draws for monitoring were stopped.  [id number] dose of hydralazine. Due to HR will hold the Coreg for now, will hold the Lasix for now. ContinuedHydralazine 100 mg q8. Pressures partially controlled.  Discharge Vitals:   BP (!) 131/94 (BP Location: Right Arm)   Pulse 75   Temp 97.8 F (36.6 C) (Oral)   Resp 14   Ht 5\' 6"  (1.676 m)   Wt (!) 42.1 kg   SpO2 95%   BMI 14.98 kg/m   Pertinent Labs, Studies, and Procedures:  CBC Latest Ref Rng & Units 04/19/2020 04/18/2020 03/09/2020  WBC 4.0 - 10.5 K/uL 6.8 5.4 7.8  Hemoglobin 12.0 - 15.0 g/dL 9.7(L) 7.0(L) 9.6(L)  Hematocrit 36 - 46 % 30.6(L) 23.0(L) 30.3(L)  Platelets 150 - 400 K/uL 247 268 256   BMP Latest Ref Rng & Units 04/22/2020 04/19/2020 04/18/2020  Glucose 70 - 99 mg/dL 80 85 105(H)  BUN 8 - 23 mg/dL 80(H) 91(H) 97(H)  Creatinine 0.44 - 1.00 mg/dL 1.89(H)  2.24(H) 2.44(H)  BUN/Creat Ratio 12 - 28 - - -  Sodium 135 - 145 mmol/L 142 139 140  Potassium 3.5 - 5.1 mmol/L 3.9 5.3(H) 4.7  Chloride 98 - 111 mmol/L 104 101 103  CO2 22 - 32 mmol/L 25 29 29   Calcium 8.9 - 10.3 mg/dL 8.6(L) 8.5(L) 8.4(L)   DG foot complete left 7/20: Deep soft tissue ulceration noted superficial to the posterior calcaneus. Indistinctness of the cortical margins subjacent to this ulceration could reflect early radiographic changes of osteomyelitis. No acute fracture or traumatic malalignment. Diffuse bony demineralization and mild degenerative changes throughout the foot.  Discharge Instructions: Discharge Instructions    (HEART FAILURE PATIENTS) Call MD:  Anytime you have any of the following symptoms: 1) 3 pound weight gain in 24 hours or 5 pounds in 1 week 2) shortness of breath, with or without a dry hacking cough 3) swelling in the hands, feet or stomach 4) if you have to sleep on extra pillows at night in order to breathe.   Complete by: As directed    Call MD for:   Complete by: As directed     Call MD for:  difficulty breathing, headache or visual disturbances   Complete by: As directed    Call MD for:  extreme fatigue   Complete by: As directed    Call MD for:  hives   Complete by: As directed    Call MD for:  persistant dizziness or light-headedness   Complete by: As directed    Call MD for:  persistant nausea and vomiting   Complete by: As directed    Call MD for:  redness, tenderness, or signs of infection (pain, swelling, redness, odor or green/yellow discharge around incision site)   Complete by: As directed    Call MD for:  severe uncontrolled pain   Complete by: As directed    Call MD for:  temperature >100.4   Complete by: As directed    Diet - low sodium heart healthy   Complete by: As directed    Diet - low sodium heart healthy   Complete by: As directed    Discharge instructions   Complete by: As directed    Gina [name], It was a pleasure taking care of you.   Discharge instructions   Complete by: As directed    Gina [name]  It was a pleasure taking care of you. You were admitted for a non healing ulcer which had become infected and infecting the bone in your heel. Thank you   Discharge wound care:   Complete by: As directed    Wound care to left heel eschar, right posterior heel callus and right lateral ankle Stage 2 pressure injury (POA):  Cleanse feet and bilateral LEs with soap and water, rinse and pat dry. Paint wounds with betadine swab sticks and allow to air dry. Cover with dry gauze dressings and secure with a few turns of Kerlix roll gauze/paper tape.  Place feet ito Prevalon boots.   Discharge wound care:   Complete by: As directed    Wound care to left heel eschar, right posterior heel callus and right lateral ankle Stage 2 pressure injury (POA):  Cleanse feet and bilateral LEs with soap and water, rinse and pat dry. Paint wounds with betadine swab sticks and allow to air dry. Cover with dry gauze dressings and secure with a few turns of  Kerlix roll gauze/paper tape.  Place feet ito Prevalon boots.   Increase  activity slowly   Complete by: As directed    Increase activity slowly   Complete by: As directed    Increase activity slowly   Complete by: As directed    No wound care   Complete by: As directed       Signed: Briant Cedar, MD 04/23/2020, 1:24 PM   Pager: [phone]

## 2020-04-23 NOTE — Progress Notes (Signed)
Report given to [employer] at [facility]Questions were answered.

## 2020-04-23 NOTE — Plan of Care (Signed)
  Problem: Education: Goal: Knowledge of General Education information will improve Description: Including pain rating scale, medication(s)/side effects and non-pharmacologic comfort measures Outcome: Progressing   Problem: Health Behavior/Discharge Planning: Goal: Ability to manage health-related needs will improve Outcome: Progressing   Problem: Clinical Measurements: Goal: Will remain free from infection Outcome: Progressing   Problem: Nutrition: Goal: Adequate nutrition will be maintained Outcome: Progressing   Problem: Safety: Goal: Ability to remain free from injury will improve Outcome: Progressing   Problem: Skin Integrity: Goal: Risk for impaired skin integrity will decrease Outcome: Progressing

## 2020-04-23 NOTE — Progress Notes (Signed)
Discharge summary packet/pertinent documents provided to [facility] staff, Pt remains alert/confused in no apparent distress. Pt has been cleaned with no skin issue/bottom sore noted .

## 2020-04-23 NOTE — Progress Notes (Signed)
[facility] Documentation  °   °Pt has been approved for [facility] transfer. [facility] does have a bed available for pt today. Paperwork has been completed and transportation can be arranged.   °   °Please call [facility] at [phone] to give charge nurse report and fax discharge summary to [phone].  °   °Please dc any lines. May leave catheter in place if pt has one. Please send pt to [facility] with DNR paperwork.   °   °Please call with any questions.   °   °Thank you,   °Jennifer Love, RN  ° °

## 2020-05-02 ENCOUNTER — Other Ambulatory Visit (INDEPENDENT_AMBULATORY_CARE_PROVIDER_SITE_OTHER): Payer: Self-pay | Admitting: Primary Care

## 2020-05-02 DIAGNOSIS — I5033 Acute on chronic diastolic (congestive) heart failure: Secondary | ICD-10-CM

## 2020-05-02 DIAGNOSIS — E890 Postprocedural hypothyroidism: Secondary | ICD-10-CM

## 2020-05-02 DIAGNOSIS — I10 Essential (primary) hypertension: Secondary | ICD-10-CM

## 2020-05-02 NOTE — Telephone Encounter (Signed)
Requested medication (s) are due for refill today: no  Requested medication (s) are on the active medication list: yes  Future visit scheduled: no  Notes to clinic: medication filled by a different provider  Review for refill Patient also requesting 2 medication that are not on list Potassium and pantoprazole    Requested Prescriptions  Pending Prescriptions Disp Refills   carvedilol (COREG) 3.125 MG tablet 30 tablet 0    Sig: Take 0.5 tablets (1.5625 mg total) by mouth 2 (two) times daily with a meal. Take 0.5 tab = 1.5625 mg by mouth twice a day      Cardiovascular:  Beta Blockers Failed - 05/18/2020  9:32 AM      Failed - Last BP in normal range    BP Readings from Last 1 Encounters:  04/23/20 (!) 131/94          Passed - Last Heart Rate in normal range    Pulse Readings from Last 1 Encounters:  04/23/20 75          Passed - Valid encounter within last 6 months    Recent Outpatient Visits           1 month ago Hospital discharge follow-up   [place], [place], NP   7 months ago Encounter for screening for eye and ear disorders   [place] [name], NP   8 months ago Right great toe amputee ([place])   [place], Michelle P, NP   1 year ago Physical deconditioning   [place] [name], NP   1 year ago Abrasion of toe of right foot, initial encounter   [place], Milford Cage, NP       Future Appointments             In 1 month Hochrein, Jeneen Rinks, MD [facility], [facility]              hydrALAZINE (APRESOLINE) 100 MG tablet 90 tablet 0    Sig: Take 1 tablet (100 mg total) by mouth every 8 (eight) hours.      Cardiovascular:  Vasodilators Failed - 05/24/2020  9:32 AM      Failed - HCT in normal range and within 360 days    HCT  Date Value Ref Range Status  04/19/2020 30.6 (L)  36 - 46 % Final   Hematocrit  Date Value Ref Range Status  03/09/2020 30.3 (L) 34.0 - 46.6 % Final          Failed - HGB in normal range and within 360 days    Hemoglobin  Date Value Ref Range Status  04/19/2020 9.7 (L) 12.0 - 15.0 g/dL Final    Comment:    REPEATED TO VERIFY POST TRANSFUSION SPECIMEN   03/09/2020 9.6 (L) 11.1 - 15.9 g/dL Final          Failed - RBC in normal range and within 360 days    RBC  Date Value Ref Range Status  04/19/2020 3.25 (L) 3.87 - 5.11 MIL/uL Final          Failed - Last BP in normal range    BP Readings from Last 1 Encounters:  04/23/20 (!) 131/94          Passed - WBC in normal range and within 360 days    WBC  Date Value Ref Range Status  04/19/2020 6.8 4.0 - 10.5 K/uL Final          Passed - PLT in normal range and within 360 days    Platelets  Date Value Ref Range Status  04/19/2020 247 150 - 400 K/uL Final  03/09/2020 256 150 - 450 x10E3/uL Final          Passed - Valid encounter within last 12 months    Recent Outpatient Visits           1 month ago Hospital discharge follow-up   [place], [place], NP   7 months ago Encounter for screening for eye and ear disorders   [place] [name], NP   8 months ago Right great toe amputee ([place])   [place], [place], NP   1 year ago Physical deconditioning   [place] [name], NP   1 year ago Abrasion of toe of right foot, initial encounter   [place], [place] Cage, NP       Future Appointments             In 1 month Hochrein, Jeneen Rinks, MD [facility], [facility]              levothyroxine (SYNTHROID) 150 MCG tablet 30 tablet 0    Sig: Take 1 tablet (150 mcg total) by mouth daily.      Endocrinology:  Hypothyroid Agents Failed - 05/14/2020  9:32 AM      Failed - TSH needs  to be rechecked within 3 months after an abnormal result. Refill until TSH is due.      Passed - TSH in normal range and within 360 days    TSH  Date Value Ref Range Status  04/19/2020 2.963 0.350 - 4.500 uIU/mL Final    Comment:    Performed by a 3rd Generation assay with a functional sensitivity of <=0.01 uIU/mL. Performed at [facility], [place] [address] [place], [place] [address]   08/19/2019 5.060 (H) 0.450 - 4.500 uIU/mL Final          Passed - Valid encounter within last 12 months    Recent Outpatient Visits           1 month ago Hospital discharge follow-up   [place], [place], NP   7 months ago Encounter for screening for eye and ear disorders   [place] [name], NP   8 months ago Right great toe amputee ([place])   [place], [place], NP   1 year ago Physical deconditioning   [place] [name], NP   1 year ago Abrasion of toe of right foot, initial encounter   [place], Milford Cage, NP       Future Appointments             In 1 month Minus Breeding, MD [facility] [place], [facility]

## 2020-05-02 NOTE — Telephone Encounter (Signed)
Medication Refill - Medication: Potassium, hydralazine, pantoprazole, carvedilol, levothyroxine   Has the patient contacted their pharmacy? Yes.   (Agent: If no, request that the patient contact the pharmacy for the refill.) (Agent: If yes, when and what did the pharmacy advise?)  Preferred Pharmacy (with phone number or street name):  [facility], [address]  [place] [address]  Phone: [phone] Fax: [phone]  Hours: Not open 24 hours     Agent: Please be advised that RX refills may take up to 3 business days. We ask that you follow-up with your pharmacy.

## 2020-05-31 DEATH — deceased
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
## 2020-06-09 ENCOUNTER — Ambulatory Visit: Payer: Medicare Other | Admitting: Cardiology

## 2020-06-27 IMAGING — US IR THORACENTESIS ASP PLEURAL SPACE W/IMG GUIDE
1 series · 2 of 2 positions shown · non-contrast
Comparison: none

INDICATION: Patient with history of chronic heart failure and recurrent pleural
effusions. Request is made for therapeutic right thoracentesis.

[Series 1: (id) · 2 of 2 slices shown]
[im 1/2]
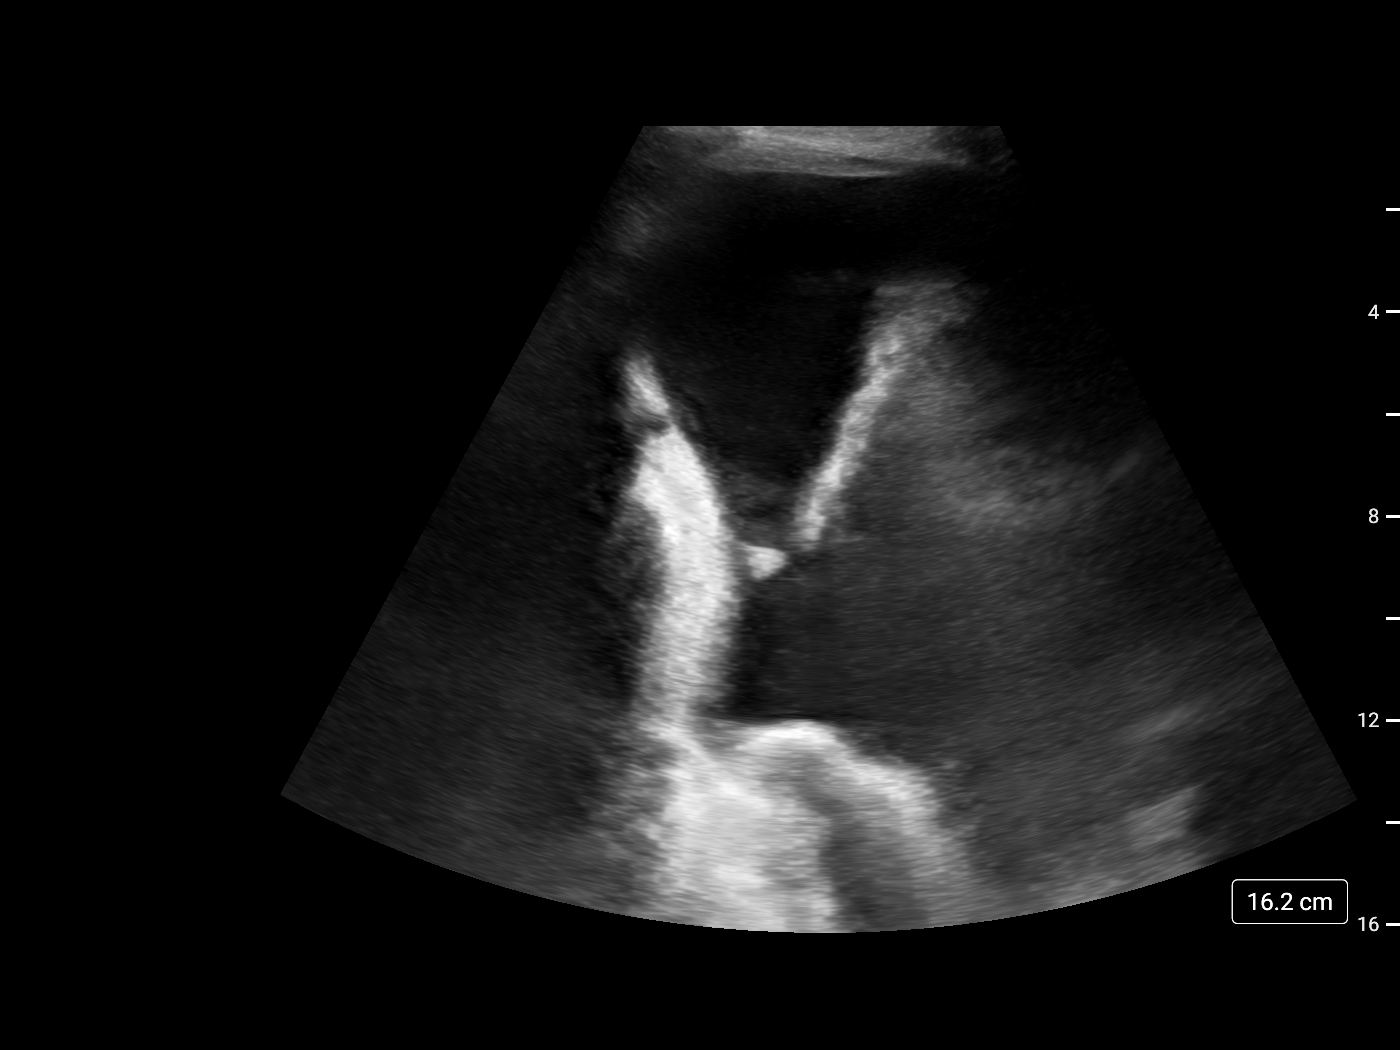
[im 2/2]
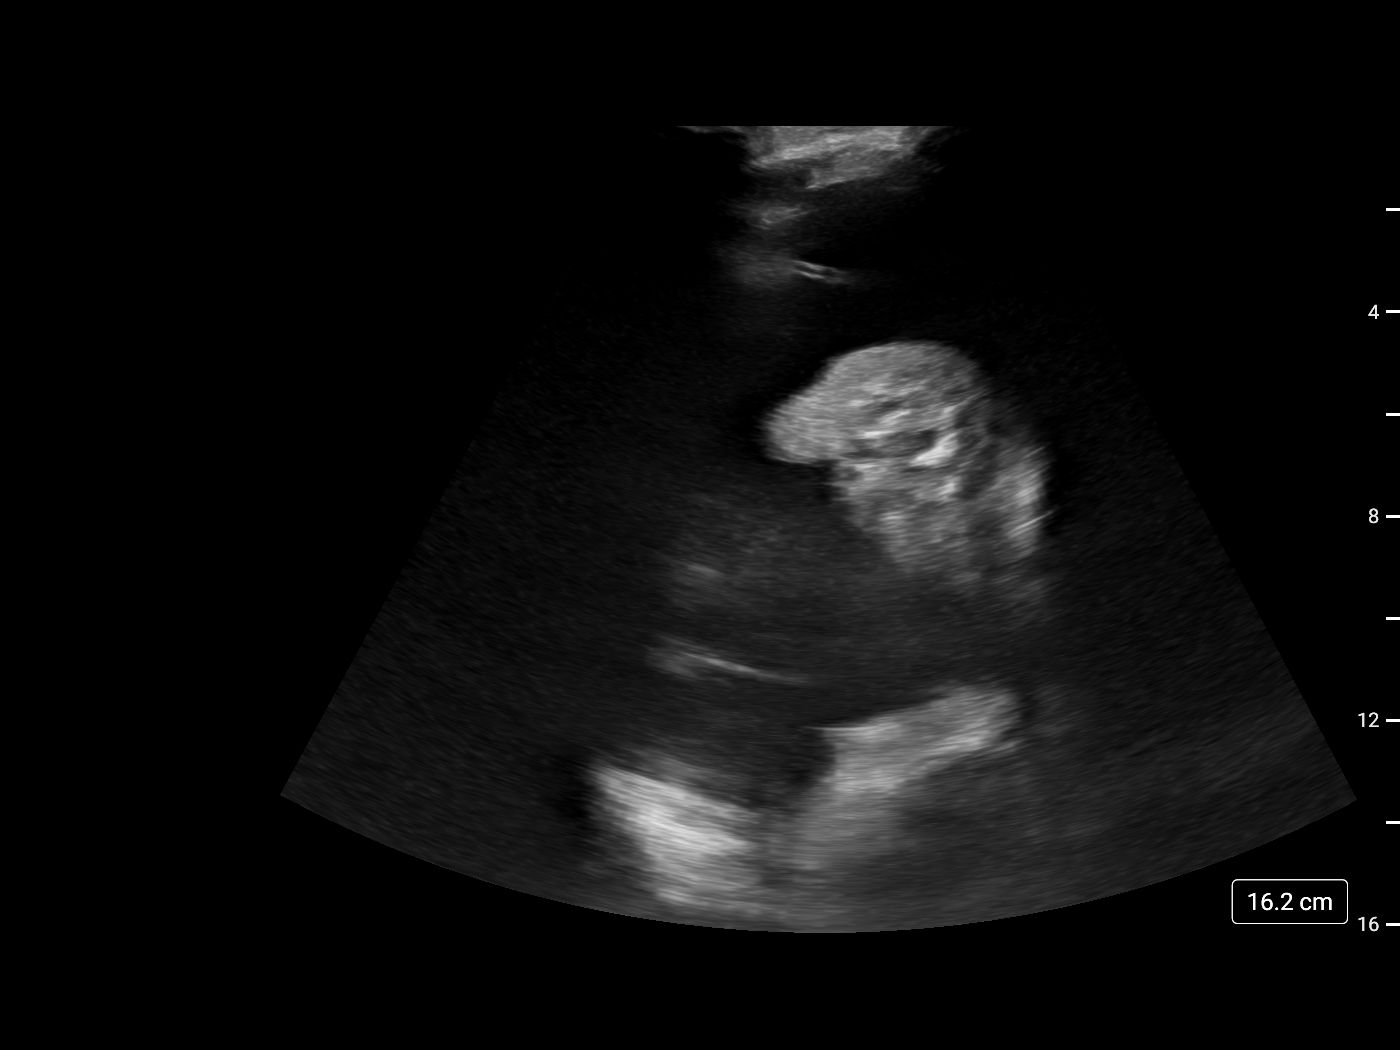

[2 of 2 positions shown; findings below may reference images not displayed]

EXAM:
ULTRASOUND GUIDED THERAPEUTIC RIGHT THORACENTESIS

MEDICATIONS:
10 mL 1% lidocaine

COMPLICATIONS:
None immediate.

PROCEDURE:
An ultrasound guided thoracentesis was thoroughly discussed with the
patient and questions answered. The benefits, risks, alternatives
and complications were also discussed. The patient understands and
wishes to proceed with the procedure. Written consent was obtained.

Ultrasound was performed to localize and mark an adequate pocket of
fluid in the right chest. The area was then prepped and draped in
the normal sterile fashion. 1% Lidocaine was used for local
anesthesia. Under ultrasound guidance a 6 Fr Safe-T-Centesis
catheter was introduced. Thoracentesis was performed. The catheter
was removed and a dressing applied.
FINDINGS: A total of approximately 700 mL of yellow fluid was removed.
IMPRESSION: Successful ultrasound guided therapeutic right thoracentesis
yielding 700 mL of pleural fluid.

## 2020-06-27 IMAGING — DX DG CHEST 1V
1 series · 1 of 1 positions shown · non-contrast
Comparison: Radiographs 09/22/2019 and 09/17/2019.

CLINICAL DATA: Post thoracentesis on the right.

EXAM:
CHEST  1 VIEW

[chest ap]
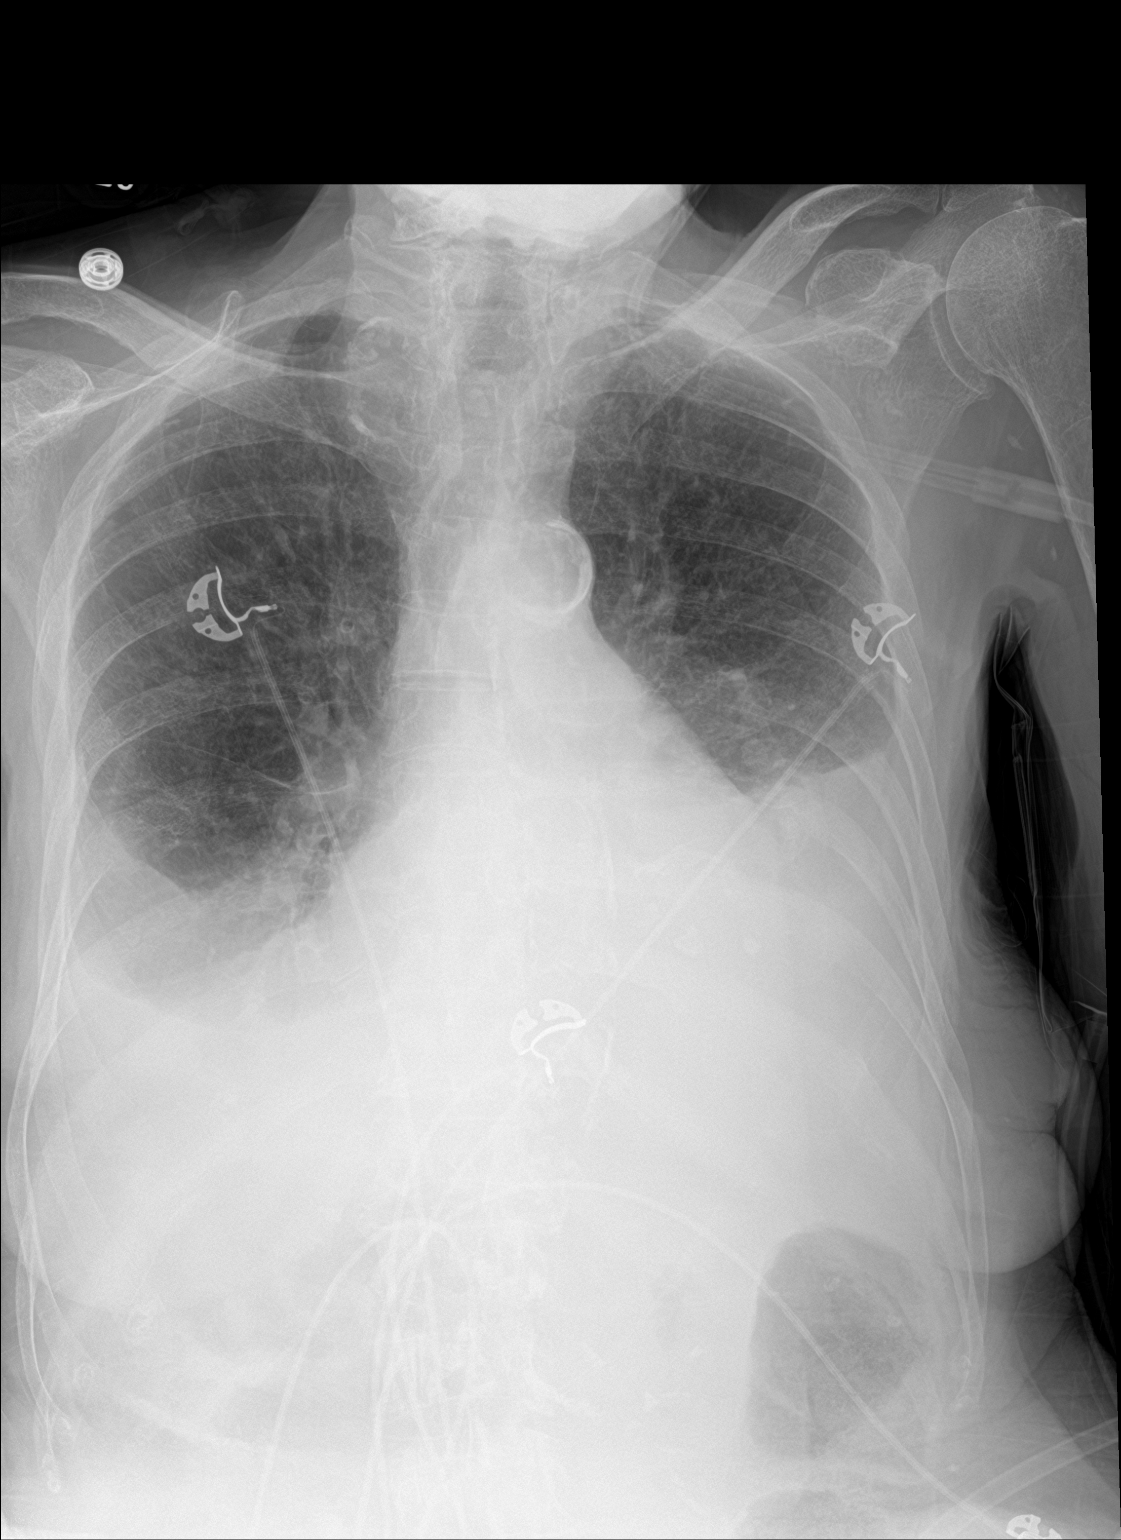

[1 of 1 positions shown; findings below may reference images not displayed]

FINDINGS: 6965 hours. The right pleural effusion is slightly smaller. There
are persistent small to moderate right and large left pleural
effusions. The right basilar aeration has mildly improved. No
pneumothorax. The heart size and mediastinal contours are stable.
Aortic and great vessel atherosclerosis noted.
IMPRESSION: No evidence of pneumothorax following right thoracentesis.
Persistent bilateral pleural effusions, left greater than right.
Mildly improved right basilar aeration.

## 2020-06-30 IMAGING — CR DG CHEST 1V PORT
1 series · 1 of 1 positions shown · non-contrast
Comparison: 09/22/2019 and 09/17/2019

CLINICAL DATA: Bilateral pleural effusions.

EXAM:
PORTABLE CHEST 1 VIEW

[AP]
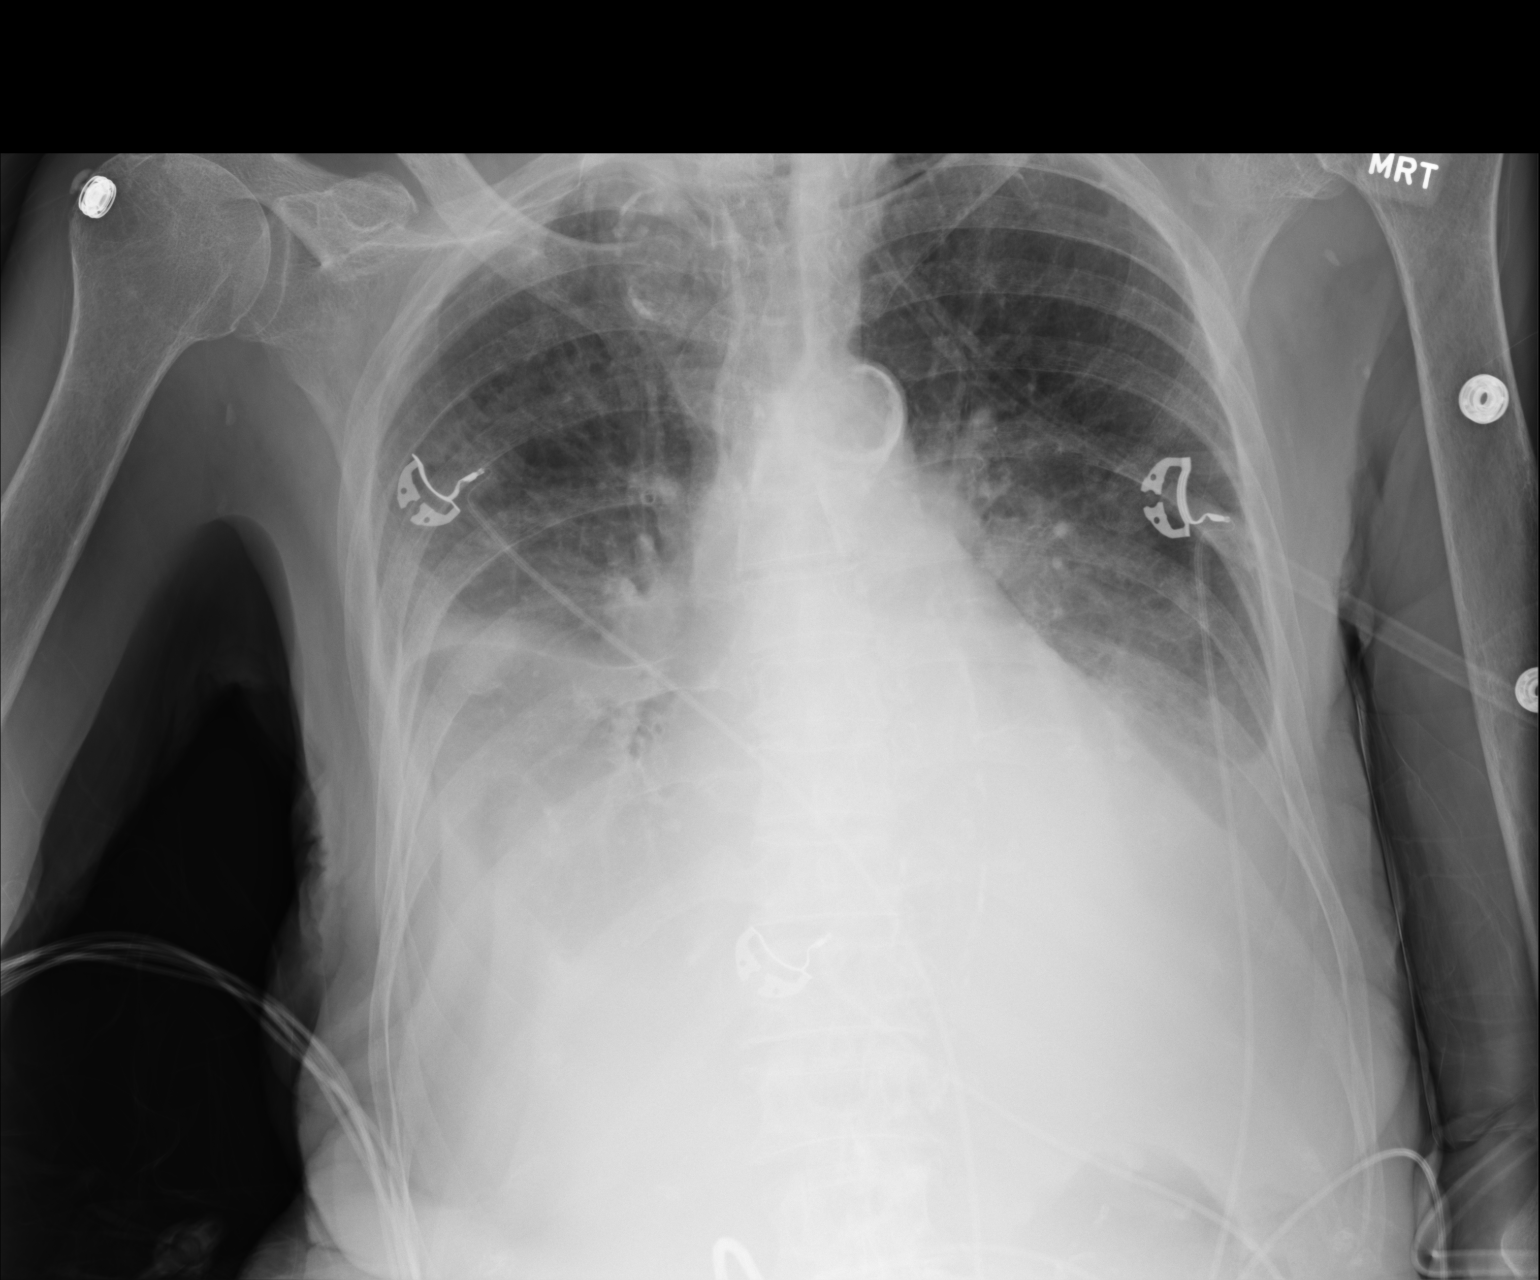

[1 of 1 positions shown; findings below may reference images not displayed]

FINDINGS: There has been slight increase in the right pleural effusion. The
left pleural effusion appears slightly smaller. Heart size and
pulmonary vascularity remain within normal limits. Aortic
atherosclerosis.
IMPRESSION: 1. Slight increase in the right pleural effusion.
2. Slight decrease in the left pleural effusion.

## 2020-07-17 IMAGING — CR DG CHEST 2V
2 series · 2 of 2 positions shown · non-contrast
Comparison: Chest x-ray 09/25/2019.

CLINICAL DATA: 89-year-old female with history of shortness of
breath. COPD. Pleural effusions.

EXAM:
CHEST - 2 VIEW

[w chest pa]
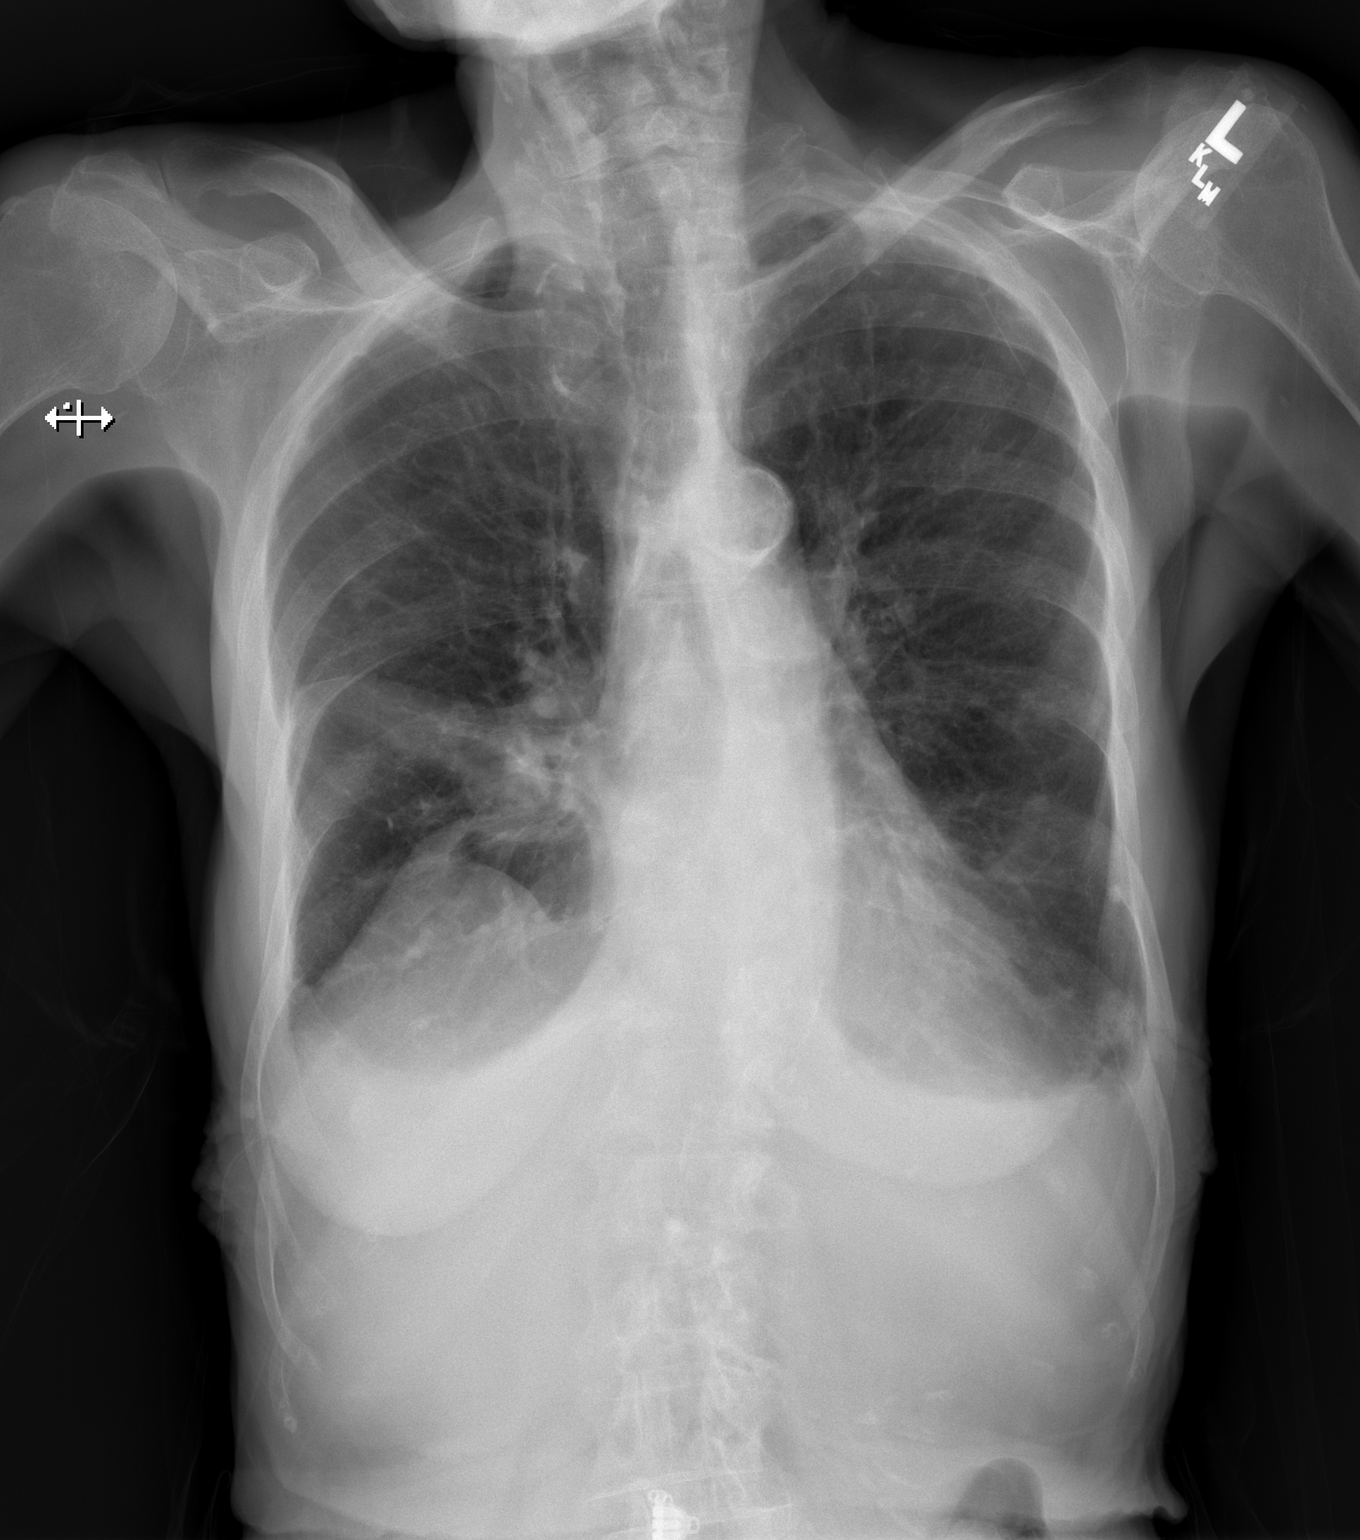

[w chest lat]
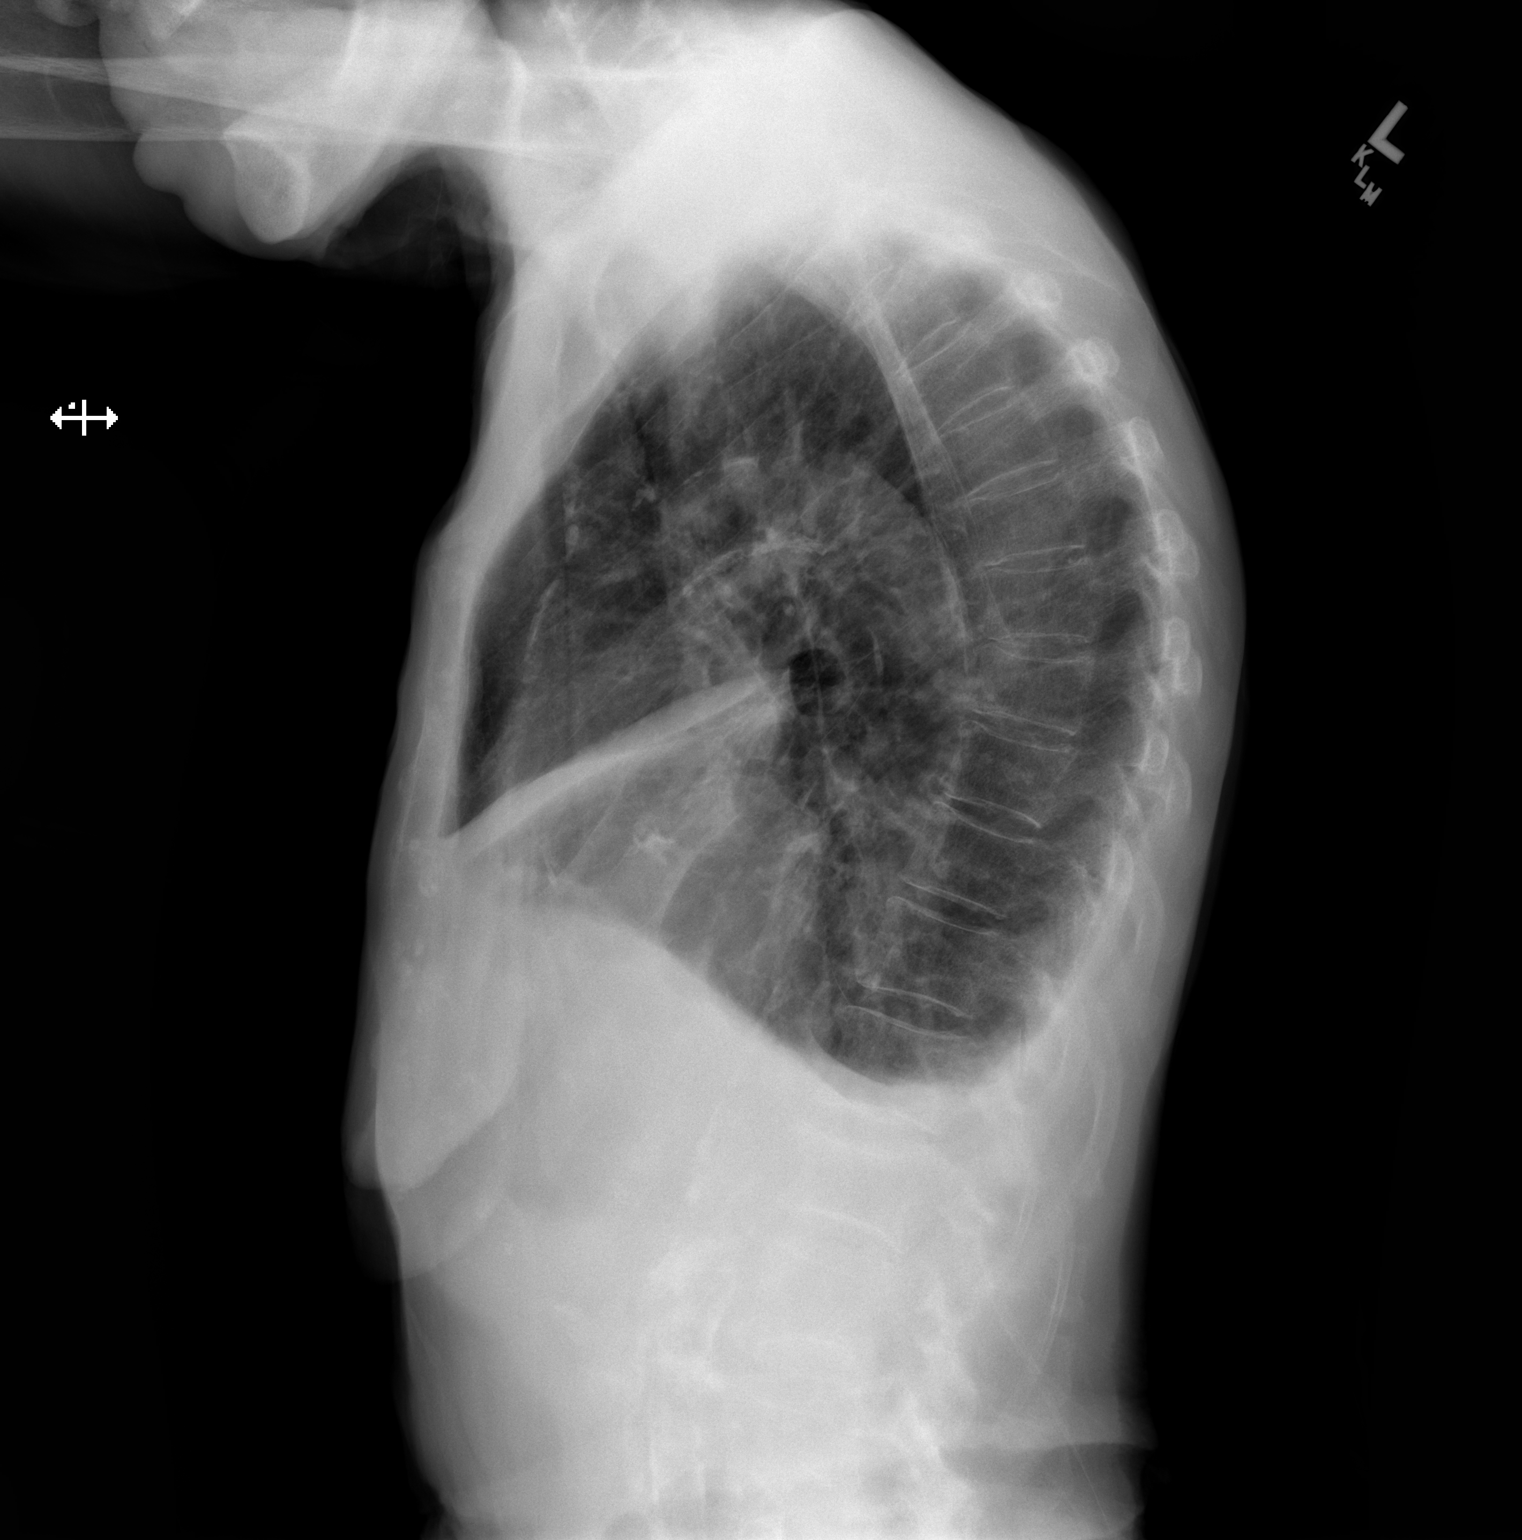

[2 of 2 positions shown; findings below may reference images not displayed]

FINDINGS: Decreased size of small bilateral, with some pleural effusions
residual pleural fluid tracking in the minor fissure. Atelectasis
and/or consolidation in the right middle lobe. Lungs otherwise
appear relatively clear. No evidence of pulmonary edema. Heart size
is normal. Upper mediastinal contours are within normal limits.
Aortic atherosclerosis.
IMPRESSION: 1. Decreasing small bilateral pleural effusions.
2. Atelectasis and/or consolidation in the right middle lobe.
Follow-up standing PA and lateral chest radiograph is recommended in
2-3 weeks to ensure the resolution of this finding. Should this
finding fail to resolve, further evaluation with contrast enhanced
chest CT would be recommended to exclude the possibility of a
centrally obstructing neoplasm.
3. Aortic atherosclerosis.
